# Patient Record
Sex: Female | Born: 1955
Health system: Southern US, Community
[De-identification: ages and names within clinical notes are randomized; demographics above are authoritative.]

## PROBLEM LIST (undated history)

## (undated) DIAGNOSIS — R112 Nausea with vomiting, unspecified: Secondary | ICD-10-CM

## (undated) DIAGNOSIS — C50412 Malignant neoplasm of upper-outer quadrant of left female breast: Secondary | ICD-10-CM

## (undated) DIAGNOSIS — J40 Bronchitis, not specified as acute or chronic: Secondary | ICD-10-CM

## (undated) DIAGNOSIS — J189 Pneumonia, unspecified organism: Secondary | ICD-10-CM

## (undated) DIAGNOSIS — I10 Essential (primary) hypertension: Secondary | ICD-10-CM

## (undated) DIAGNOSIS — R200 Anesthesia of skin: Secondary | ICD-10-CM

## (undated) DIAGNOSIS — F419 Anxiety disorder, unspecified: Secondary | ICD-10-CM

## (undated) DIAGNOSIS — M1990 Unspecified osteoarthritis, unspecified site: Secondary | ICD-10-CM

## (undated) DIAGNOSIS — Z9889 Other specified postprocedural states: Secondary | ICD-10-CM

## (undated) DIAGNOSIS — F329 Major depressive disorder, single episode, unspecified: Secondary | ICD-10-CM

## (undated) DIAGNOSIS — R202 Paresthesia of skin: Secondary | ICD-10-CM

## (undated) DIAGNOSIS — F32A Depression, unspecified: Secondary | ICD-10-CM

## (undated) DIAGNOSIS — J449 Chronic obstructive pulmonary disease, unspecified: Secondary | ICD-10-CM

## (undated) DIAGNOSIS — R351 Nocturia: Secondary | ICD-10-CM

## (undated) HISTORY — PX: BREAST LUMPECTOMY: SHX2

## (undated) HISTORY — DX: Malignant neoplasm of upper-outer quadrant of left female breast: C50.412

## (undated) HISTORY — PX: MULTIPLE TOOTH EXTRACTIONS: SHX2053

---

## 1998-11-12 ENCOUNTER — Encounter: Admission: RE | Admit: 1998-11-12 | Discharge: 1998-11-12 | Payer: Self-pay | Admitting: General Practice

## 1998-11-12 ENCOUNTER — Encounter: Payer: Self-pay | Admitting: General Practice

## 1999-01-09 ENCOUNTER — Encounter: Payer: Self-pay | Admitting: General Practice

## 1999-01-09 ENCOUNTER — Encounter: Admission: RE | Admit: 1999-01-09 | Discharge: 1999-01-09 | Payer: Self-pay | Admitting: General Practice

## 1999-01-15 ENCOUNTER — Encounter: Payer: Self-pay | Admitting: General Practice

## 1999-01-15 ENCOUNTER — Ambulatory Visit (HOSPITAL_COMMUNITY): Admission: RE | Admit: 1999-01-15 | Discharge: 1999-01-15 | Payer: Self-pay | Admitting: General Practice

## 1999-01-15 ENCOUNTER — Encounter (INDEPENDENT_AMBULATORY_CARE_PROVIDER_SITE_OTHER): Payer: Self-pay

## 1999-04-10 ENCOUNTER — Encounter (HOSPITAL_BASED_OUTPATIENT_CLINIC_OR_DEPARTMENT_OTHER): Payer: Self-pay | Admitting: General Surgery

## 1999-04-14 ENCOUNTER — Encounter (INDEPENDENT_AMBULATORY_CARE_PROVIDER_SITE_OTHER): Payer: Self-pay | Admitting: *Deleted

## 1999-04-14 ENCOUNTER — Ambulatory Visit (HOSPITAL_COMMUNITY): Admission: RE | Admit: 1999-04-14 | Discharge: 1999-04-14 | Payer: Self-pay | Admitting: General Surgery

## 2000-08-24 ENCOUNTER — Other Ambulatory Visit: Admission: RE | Admit: 2000-08-24 | Discharge: 2000-08-24 | Payer: Self-pay | Admitting: Obstetrics and Gynecology

## 2000-11-09 ENCOUNTER — Encounter: Payer: Self-pay | Admitting: General Practice

## 2000-11-09 ENCOUNTER — Encounter: Admission: RE | Admit: 2000-11-09 | Discharge: 2000-11-09 | Payer: Self-pay | Admitting: General Practice

## 2000-12-14 ENCOUNTER — Encounter: Payer: Self-pay | Admitting: General Practice

## 2000-12-14 ENCOUNTER — Encounter: Admission: RE | Admit: 2000-12-14 | Discharge: 2000-12-14 | Payer: Self-pay | Admitting: General Practice

## 2002-06-30 ENCOUNTER — Other Ambulatory Visit: Admission: RE | Admit: 2002-06-30 | Discharge: 2002-06-30 | Payer: Self-pay | Admitting: Obstetrics and Gynecology

## 2003-06-03 ENCOUNTER — Emergency Department (HOSPITAL_COMMUNITY): Admission: EM | Admit: 2003-06-03 | Discharge: 2003-06-03 | Payer: Self-pay | Admitting: Emergency Medicine

## 2003-06-09 ENCOUNTER — Emergency Department (HOSPITAL_COMMUNITY): Admission: EM | Admit: 2003-06-09 | Discharge: 2003-06-09 | Payer: Self-pay

## 2003-07-05 ENCOUNTER — Emergency Department (HOSPITAL_COMMUNITY): Admission: EM | Admit: 2003-07-05 | Discharge: 2003-07-05 | Payer: Self-pay | Admitting: Family Medicine

## 2005-05-11 ENCOUNTER — Emergency Department (HOSPITAL_COMMUNITY): Admission: EM | Admit: 2005-05-11 | Discharge: 2005-05-11 | Payer: Self-pay | Admitting: Family Medicine

## 2005-05-14 ENCOUNTER — Emergency Department (HOSPITAL_COMMUNITY): Admission: EM | Admit: 2005-05-14 | Discharge: 2005-05-14 | Payer: Self-pay | Admitting: Family Medicine

## 2006-09-09 ENCOUNTER — Emergency Department (HOSPITAL_COMMUNITY): Admission: EM | Admit: 2006-09-09 | Discharge: 2006-09-09 | Payer: Self-pay | Admitting: Emergency Medicine

## 2006-09-29 ENCOUNTER — Emergency Department (HOSPITAL_COMMUNITY): Admission: EM | Admit: 2006-09-29 | Discharge: 2006-09-29 | Payer: Self-pay | Admitting: Emergency Medicine

## 2007-09-16 ENCOUNTER — Emergency Department (HOSPITAL_COMMUNITY): Admission: EM | Admit: 2007-09-16 | Discharge: 2007-09-16 | Payer: Self-pay | Admitting: Family Medicine

## 2007-09-17 ENCOUNTER — Emergency Department (HOSPITAL_COMMUNITY): Admission: EM | Admit: 2007-09-17 | Discharge: 2007-09-18 | Payer: Self-pay | Admitting: Emergency Medicine

## 2007-10-04 ENCOUNTER — Encounter: Admission: RE | Admit: 2007-10-04 | Discharge: 2007-10-04 | Payer: Self-pay | Admitting: Cardiovascular Disease

## 2007-10-28 ENCOUNTER — Emergency Department (HOSPITAL_COMMUNITY): Admission: EM | Admit: 2007-10-28 | Discharge: 2007-10-28 | Payer: Self-pay | Admitting: Emergency Medicine

## 2007-10-29 ENCOUNTER — Emergency Department (HOSPITAL_COMMUNITY): Admission: EM | Admit: 2007-10-29 | Discharge: 2007-10-29 | Payer: Self-pay | Admitting: Family Medicine

## 2007-11-01 ENCOUNTER — Encounter: Admission: RE | Admit: 2007-11-01 | Discharge: 2007-11-01 | Payer: Self-pay | Admitting: Cardiovascular Disease

## 2008-09-16 ENCOUNTER — Emergency Department (HOSPITAL_COMMUNITY): Admission: EM | Admit: 2008-09-16 | Discharge: 2008-09-16 | Payer: Self-pay | Admitting: Family Medicine

## 2009-03-24 ENCOUNTER — Emergency Department (HOSPITAL_COMMUNITY): Admission: EM | Admit: 2009-03-24 | Discharge: 2009-03-24 | Payer: Self-pay | Admitting: Emergency Medicine

## 2009-03-25 ENCOUNTER — Ambulatory Visit (HOSPITAL_COMMUNITY): Admission: RE | Admit: 2009-03-25 | Discharge: 2009-03-25 | Payer: Self-pay | Admitting: Emergency Medicine

## 2009-03-25 ENCOUNTER — Ambulatory Visit: Payer: Self-pay | Admitting: Vascular Surgery

## 2009-03-25 ENCOUNTER — Encounter (INDEPENDENT_AMBULATORY_CARE_PROVIDER_SITE_OTHER): Payer: Self-pay | Admitting: Emergency Medicine

## 2010-01-26 ENCOUNTER — Encounter: Payer: Self-pay | Admitting: Orthopedic Surgery

## 2010-03-31 LAB — CBC
HCT: 37.1 % (ref 36.0–46.0)
Platelets: 265 10*3/uL (ref 150–400)
RDW: 13.8 % (ref 11.5–15.5)

## 2010-03-31 LAB — D-DIMER, QUANTITATIVE: D-Dimer, Quant: 0.56 ug/mL-FEU — ABNORMAL HIGH (ref 0.00–0.48)

## 2010-04-11 LAB — POCT URINALYSIS DIP (DEVICE)
Glucose, UA: NEGATIVE mg/dL
Ketones, ur: NEGATIVE mg/dL
Nitrite: NEGATIVE
Urobilinogen, UA: 0.2 mg/dL (ref 0.0–1.0)

## 2010-04-15 ENCOUNTER — Inpatient Hospital Stay (INDEPENDENT_AMBULATORY_CARE_PROVIDER_SITE_OTHER)
Admission: RE | Admit: 2010-04-15 | Discharge: 2010-04-15 | Disposition: A | Discharge status: Home / Self Care | Payer: Self-pay | Source: Ambulatory Visit | Attending: Emergency Medicine | Admitting: Emergency Medicine

## 2010-04-15 DIAGNOSIS — IMO0002 Reserved for concepts with insufficient information to code with codable children: Secondary | ICD-10-CM

## 2010-05-22 ENCOUNTER — Inpatient Hospital Stay (INDEPENDENT_AMBULATORY_CARE_PROVIDER_SITE_OTHER)
Admission: RE | Admit: 2010-05-22 | Discharge: 2010-05-22 | Disposition: A | Discharge status: Home / Self Care | Payer: Self-pay | Source: Ambulatory Visit | Attending: Family Medicine | Admitting: Family Medicine

## 2010-05-22 DIAGNOSIS — J40 Bronchitis, not specified as acute or chronic: Secondary | ICD-10-CM

## 2010-05-23 NOTE — Op Note (Signed)
[place]  Patient:    Deborah Mcintyre, Deborah Mcintyre                    MRN: [id number] Proc. Date: 04/14/99 Adm. Date:  [id number] Attending:  Sonda Primes CC:         Mardene Celeste. Lurene Shadow, M.D. (2)                           Operative Report  PREOPERATIVE DIAGNOSIS: 1. Fibroadenoma of right breast. 2. Sebaceous cyst of left breast.  POSTOPERATIVE DIAGNOSIS: 1. Fibroadenoma of right breast. 2. Sebaceous cyst of left breast.  OPERATION PERFORMED:  Removal of bilateral breast masses, fibroadenoma and sebaceous cyst.  SURGEON:   Luisa Hart L. Lurene Shadow, M.D.  ASSISTANT:  Nurse.  ANESTHESIA:  General.  INDICATIONS FOR PROCEDURE:  The patient is a 55 year old woman presenting with bilateral breast masses, one of which is clinically a fibroadenoma on the right side and the left sided lesion appears to be a sebaceous cyst within the inframammary fold.  She is brought to the operating room now for excision.  DESCRIPTION OF PROCEDURE:  Following induction of anesthesia with the patient positioned supinely, the breasts were prepped and draped to be included in a sterile operative field.  The right breast mass was first approached and its location identified as right at the axillary tail of the axillary hairline. This was approached through a transverse incision and deepened through the skin and subcutaneous tissues carrying the dissection down to the mass.  The mass was sharply excised and removed and forwarded for pathologic evaluation. Hemostasis was ensured with electrocautery.  Sponge, instrument and sharp counts were verified.  Wound closed with interrupted 3-0 Vicryl sutures in the deep tissues and a 5-0 Monocryl in the skin, reinforced with Steri-Strips.  Attention was then turned to the left breast where the inframammary lesion was located.  An elliptical incision was carried down around the lesion, deepened through the skin and subcutaneous  tissues and taking a margin of normal tissue around the sebaceous cyst.  This was removed and forwarded for pathologic evaluation.  Hemostasis was obtained with electrocautery.  Subcutaneous tissues closed with interrupted 3-0 Vicryl sutures.  Skin closed with 5-0 Monocryl suture.  Both wounds were steri-stripped and sterile dressings were applied.  Anesthetic reversed.  Patient removed from the operating room to the recovery room in stable condition having tolerated the procedure well. DD:  04/14/99 TD:  04/14/99 Job: [id number]

## 2010-08-27 ENCOUNTER — Inpatient Hospital Stay (INDEPENDENT_AMBULATORY_CARE_PROVIDER_SITE_OTHER)
Admission: RE | Admit: 2010-08-27 | Discharge: 2010-08-27 | Disposition: A | Discharge status: Home / Self Care | Payer: Self-pay | Source: Ambulatory Visit | Attending: Family Medicine | Admitting: Family Medicine

## 2010-08-27 ENCOUNTER — Emergency Department (HOSPITAL_COMMUNITY)
Admission: EM | Admit: 2010-08-27 | Discharge: 2010-08-27 | Disposition: A | Discharge status: Home / Self Care | Payer: Self-pay | Attending: Emergency Medicine | Admitting: Emergency Medicine

## 2010-08-27 DIAGNOSIS — H11419 Vascular abnormalities of conjunctiva, unspecified eye: Secondary | ICD-10-CM | POA: Insufficient documentation

## 2010-08-27 DIAGNOSIS — H5789 Other specified disorders of eye and adnexa: Secondary | ICD-10-CM | POA: Insufficient documentation

## 2010-08-27 DIAGNOSIS — F329 Major depressive disorder, single episode, unspecified: Secondary | ICD-10-CM | POA: Insufficient documentation

## 2010-08-27 DIAGNOSIS — Z79899 Other long term (current) drug therapy: Secondary | ICD-10-CM | POA: Insufficient documentation

## 2010-08-27 DIAGNOSIS — F3289 Other specified depressive episodes: Secondary | ICD-10-CM | POA: Insufficient documentation

## 2010-08-27 DIAGNOSIS — H05019 Cellulitis of unspecified orbit: Secondary | ICD-10-CM | POA: Insufficient documentation

## 2010-08-27 DIAGNOSIS — M549 Dorsalgia, unspecified: Secondary | ICD-10-CM | POA: Insufficient documentation

## 2010-08-27 DIAGNOSIS — G8929 Other chronic pain: Secondary | ICD-10-CM | POA: Insufficient documentation

## 2010-08-27 DIAGNOSIS — M129 Arthropathy, unspecified: Secondary | ICD-10-CM | POA: Insufficient documentation

## 2010-08-27 DIAGNOSIS — H109 Unspecified conjunctivitis: Secondary | ICD-10-CM | POA: Insufficient documentation

## 2010-08-27 DIAGNOSIS — I10 Essential (primary) hypertension: Secondary | ICD-10-CM | POA: Insufficient documentation

## 2010-10-07 LAB — CBC
Hemoglobin: 10.4 — ABNORMAL LOW
Platelets: 226
RBC: 3.23 — ABNORMAL LOW
RDW: 14.2

## 2010-10-07 LAB — URINALYSIS, ROUTINE W REFLEX MICROSCOPIC
Leukocytes, UA: NEGATIVE
Nitrite: NEGATIVE
Protein, ur: NEGATIVE
Urobilinogen, UA: 0.2
pH: 6.5

## 2010-10-07 LAB — COMPREHENSIVE METABOLIC PANEL
ALT: 12
AST: 18
Albumin: 3.6
Calcium: 8.4
Chloride: 101
Creatinine, Ser: 0.66
GFR calc Af Amer: >60
GFR calc non Af Amer: >60
Glucose, Bld: 85
Potassium: 3.7
Sodium: 133 — ABNORMAL LOW
Total Protein: 5.5 — ABNORMAL LOW

## 2010-10-07 LAB — URINE MICROSCOPIC-ADD ON

## 2010-10-07 LAB — POCT PREGNANCY, URINE: Preg Test, Ur: NEGATIVE

## 2010-10-08 LAB — COMPREHENSIVE METABOLIC PANEL
ALT: 8
CO2: 20
Calcium: 8.2 — ABNORMAL LOW
Chloride: 109
Creatinine, Ser: 0.61
GFR calc Af Amer: >60
Potassium: 3.6
Sodium: 137
Total Bilirubin: 0.7
Total Protein: 5.8 — ABNORMAL LOW

## 2010-10-08 LAB — POCT URINALYSIS DIP (DEVICE)
Nitrite: NEGATIVE
Operator id: 239701
Protein, ur: NEGATIVE

## 2010-10-08 LAB — CBC
Platelets: 172
RBC: 3.21 — ABNORMAL LOW
RDW: 15.1
WBC: 8.6

## 2010-10-08 LAB — DIFFERENTIAL
Basophils Absolute: 0
Basophils Relative: 0
Lymphocytes Relative: 18
Monocytes Absolute: 0.5
Neutro Abs: 6.3
Neutrophils Relative %: 74

## 2010-10-08 LAB — LIPASE, BLOOD: Lipase: 18

## 2010-10-16 LAB — URINE CULTURE

## 2010-10-16 LAB — POCT URINALYSIS DIP (DEVICE)
Ketones, ur: NEGATIVE
Nitrite: NEGATIVE
Operator id: 247071
Protein, ur: 100 — AB

## 2010-10-17 LAB — POCT PREGNANCY, URINE
Operator id: 239701
Preg Test, Ur: NEGATIVE

## 2010-10-17 LAB — POCT URINALYSIS DIP (DEVICE)
Bilirubin Urine: NEGATIVE
Glucose, UA: NEGATIVE
Nitrite: NEGATIVE
Operator id: 239701
Urobilinogen, UA: 0.2

## 2010-10-17 LAB — POCT H PYLORI SCREEN: H. PYLORI SCREEN, POC: POSITIVE — AB

## 2010-10-17 LAB — URINE CULTURE: Colony Count: NO GROWTH

## 2011-01-08 ENCOUNTER — Encounter: Payer: Self-pay | Admitting: *Deleted

## 2011-01-08 ENCOUNTER — Emergency Department (HOSPITAL_COMMUNITY)
Admission: EM | Admit: 2011-01-08 | Discharge: 2011-01-08 | Disposition: A | Discharge status: Home / Self Care | Payer: Self-pay | Attending: Emergency Medicine | Admitting: Emergency Medicine

## 2011-01-08 ENCOUNTER — Emergency Department (HOSPITAL_COMMUNITY): Payer: Self-pay

## 2011-01-08 DIAGNOSIS — R509 Fever, unspecified: Secondary | ICD-10-CM | POA: Insufficient documentation

## 2011-01-08 DIAGNOSIS — R059 Cough, unspecified: Secondary | ICD-10-CM | POA: Insufficient documentation

## 2011-01-08 DIAGNOSIS — R062 Wheezing: Secondary | ICD-10-CM | POA: Insufficient documentation

## 2011-01-08 DIAGNOSIS — J3489 Other specified disorders of nose and nasal sinuses: Secondary | ICD-10-CM | POA: Insufficient documentation

## 2011-01-08 DIAGNOSIS — R110 Nausea: Secondary | ICD-10-CM | POA: Insufficient documentation

## 2011-01-08 DIAGNOSIS — I10 Essential (primary) hypertension: Secondary | ICD-10-CM | POA: Insufficient documentation

## 2011-01-08 DIAGNOSIS — IMO0001 Reserved for inherently not codable concepts without codable children: Secondary | ICD-10-CM | POA: Insufficient documentation

## 2011-01-08 DIAGNOSIS — J441 Chronic obstructive pulmonary disease with (acute) exacerbation: Secondary | ICD-10-CM | POA: Insufficient documentation

## 2011-01-08 DIAGNOSIS — J111 Influenza due to unidentified influenza virus with other respiratory manifestations: Secondary | ICD-10-CM | POA: Insufficient documentation

## 2011-01-08 DIAGNOSIS — R51 Headache: Secondary | ICD-10-CM | POA: Insufficient documentation

## 2011-01-08 DIAGNOSIS — R0602 Shortness of breath: Secondary | ICD-10-CM | POA: Insufficient documentation

## 2011-01-08 DIAGNOSIS — R05 Cough: Secondary | ICD-10-CM | POA: Insufficient documentation

## 2011-01-08 HISTORY — DX: Bronchitis, not specified as acute or chronic: J40

## 2011-01-08 HISTORY — DX: Essential (primary) hypertension: I10

## 2011-01-08 HISTORY — DX: Pneumonia, unspecified organism: J18.9

## 2011-01-08 HISTORY — DX: Unspecified osteoarthritis, unspecified site: M19.90

## 2011-01-08 MED ORDER — DOXYCYCLINE HYCLATE 100 MG PO CAPS: 100.0000 mg | ORAL_CAPSULE | Freq: Two times a day (BID) | ORAL | Status: AC

## 2011-01-08 MED ORDER — PREDNISONE 20 MG PO TABS: 60.0000 mg | ORAL_TABLET | Freq: Every day | ORAL | Status: AC

## 2011-01-08 MED ORDER — ALBUTEROL SULFATE HFA 108 (90 BASE) MCG/ACT IN AERS
2.0000 | INHALATION_SPRAY | RESPIRATORY_TRACT | Status: DC | PRN
  Filled 2011-01-08: qty 6.7

## 2011-01-08 MED ORDER — ALBUTEROL SULFATE (2.5 MG/3ML) 0.083% IN NEBU: 2.5000 mg | INHALATION_SOLUTION | Freq: Four times a day (QID) | RESPIRATORY_TRACT | Status: DC | PRN

## 2011-01-08 MED ORDER — IPRATROPIUM BROMIDE 0.02 % IN SOLN
0.5000 mg | Freq: Once | RESPIRATORY_TRACT | Status: AC
  Administered 2011-01-08: 0.5 mg via RESPIRATORY_TRACT
  Filled 2011-01-08: qty 2.5

## 2011-01-08 MED ORDER — ACETAMINOPHEN 325 MG PO TABS
975.0000 mg | ORAL_TABLET | Freq: Once | ORAL | Status: AC
  Administered 2011-01-08: 975 mg via ORAL
  Filled 2011-01-08: qty 1

## 2011-01-08 MED ORDER — ALBUTEROL SULFATE (5 MG/ML) 0.5% IN NEBU
2.5000 mg | INHALATION_SOLUTION | Freq: Once | RESPIRATORY_TRACT | Status: AC
  Administered 2011-01-08: 2.5 mg via RESPIRATORY_TRACT
  Filled 2011-01-08: qty 1

## 2011-01-08 MED ORDER — ALBUTEROL SULFATE (5 MG/ML) 0.5% IN NEBU
5.0000 mg | INHALATION_SOLUTION | RESPIRATORY_TRACT | Status: DC
  Administered 2011-01-08 (×2): 5 mg via RESPIRATORY_TRACT
  Filled 2011-01-08 (×2): qty 1

## 2011-01-08 MED ORDER — ACETAMINOPHEN 325 MG PO TABS
ORAL_TABLET | ORAL | Status: AC
  Administered 2011-01-08: 975 mg via ORAL
  Filled 2011-01-08: qty 3

## 2011-01-08 MED ORDER — PREDNISONE 20 MG PO TABS
60.0000 mg | ORAL_TABLET | Freq: Once | ORAL | Status: AC
  Administered 2011-01-08: 60 mg via ORAL
  Filled 2011-01-08: qty 3

## 2011-01-08 MED ORDER — AEROCHAMBER MAX W/MASK MEDIUM MISC: Status: AC

## 2011-01-08 MED ORDER — ALBUTEROL SULFATE (5 MG/ML) 0.5% IN NEBU
2.5000 mg | INHALATION_SOLUTION | Freq: Once | RESPIRATORY_TRACT | Status: AC
Start: 2011-01-08 — End: 2011-01-08
  Administered 2011-01-08: 2.5 mg via RESPIRATORY_TRACT

## 2011-01-08 NOTE — ED Provider Notes (Signed)
History     CSN: [id number]  Arrival date & time 01/08/11  1153   First MD Initiated Contact with Patient 01/08/11 1205      Chief Complaint  Patient presents with  . Fever    (Consider location/radiation/quality/duration/timing/severity/associated sxs/prior treatment) HPI Comments: Patient reports that for the past 3 days she has had headache, sore throat, body aches, cough, fever, and nasal congestion.  She reports that she has been taking care of her mother who has influenza.  She did not get a flu shot this year.  PMH significant for COPD.  She reports that over the last couple of days she has been more short of breath.  She currently smokes 1 ppd and has for the past 37 years.  She denies any chest pain at this time.    Patient has not had any surgeries in the past 4 weeks, no prolonged travel in the past 4 weeks, no prior history of DVT/PE, no estrogen therapy.  Patient is a 56 y.o. female presenting with fever. The history is provided by the patient.  Fever Primary symptoms of the febrile illness include fever, headaches, wheezing, shortness of breath and nausea. Primary symptoms do not include abdominal pain, vomiting, diarrhea or rash. Episode onset: Three days ago. The problem has been gradually worsening.  The headache is not associated with neck stiffness.     Past Medical History  Diagnosis Date  . Hypertension   . Pneumonia   . Bronchitis   . Arthritis     Past Surgical History  Procedure Date  . Cesarean section     History reviewed. No pertinent family history.  History  Substance Use Topics  . Smoking status: Current Everyday Smoker -- 1.0 packs/day  . Smokeless tobacco: Not on file  . Alcohol Use: 2.4 oz/week    4 Cans of beer per week    OB History    Grav Para Term Preterm Abortions TAB SAB Ect Mult Living                  Review of Systems  Constitutional: Positive for fever. Negative for chills and diaphoresis.  HENT: Positive for sore  throat and rhinorrhea. Negative for neck pain and neck stiffness.   Eyes: Negative for visual disturbance.  Respiratory: Positive for shortness of breath and wheezing.   Cardiovascular: Negative for chest pain.  Gastrointestinal: Positive for nausea. Negative for vomiting, abdominal pain and diarrhea.  Skin: Negative for rash.  Neurological: Positive for dizziness and headaches. Negative for syncope.  Psychiatric/Behavioral: Negative for confusion.    Allergies  Review of patient's allergies indicates no known allergies.  Home Medications  No current outpatient prescriptions on file.  BP 144/83  Pulse 127  Temp(Src) 103 F (39.4 C) (Oral)  Resp 38  SpO2 98%  Physical Exam  Nursing note and vitals reviewed. Constitutional: She is oriented to person, place, and time. She appears well-developed and well-nourished. No distress.  HENT:  Head: Normocephalic and atraumatic.  Neck: Normal range of motion. Neck supple.  Cardiovascular: Regular rhythm and normal heart sounds.  Tachycardia present.   Pulmonary/Chest: No accessory muscle usage. Tachypnea noted. She has wheezes. She has no rhonchi. She has no rales.       Diffuse expiratory wheezing.    Abdominal: Soft. Bowel sounds are normal. She exhibits no distension and no mass. There is no tenderness. There is no rebound and no guarding.  Neurological: She is alert and oriented to person, place, and time.  Skin: Skin is warm and dry. She is not diaphoretic.  Psychiatric: She has a normal mood and affect.    ED Course  Procedures (including critical care time)  Labs Reviewed - No data to display Dg Chest 2 View  01/08/2011  *RADIOLOGY REPORT*  Clinical Data: Fever and cough.  CHEST - 2 VIEW  Comparison: Chest x-ray 09/17/2007.  Findings: The cardiac silhouette, mediastinal and hilar contours are within normal limits and stable.  There are bronchitic type lung changes but no infiltrates or effusions.  The bony thorax is intact.   IMPRESSION: Bronchitic type lung changes but no focal infiltrates.  Original Report Authenticated By: P. Loralie Champagne, M.D.     No diagnosis found.  Patient given Albuterol/Atrovent neb treatment.  She reports that she is feeling somewhat better after the treatment.  Patient remains tachypneic.  Lungs with diffuse expiratory wheezing.  Will order another breathing treatment and 60mg  Prednisone.  Reassessed patient after 2nd breathing treatment.  Lungs now CTAB.  Her oxygen saturation is at 92 on RA.   She reports that she is feeling much better.  Plan is to walk patient to make sure that her oxygen level does not desat while walking.  Patient walked and her oxygen sat did not decrease while walking.  Therefore, feel that patient can be discharged at this time.  Patient in agreement with the plan.  She will be discharged with Prednisone, antibiotic, and albuterol nebs.   MDM  Patient tachycardic.  However, feel that tachycardia is most likely secondary to albuterol treatment and fever.  Patient has not had any surgeries in the past 4 weeks, no prolonged travel in the past 4 weeks, no prior history of DVT/PE, no estrogen therapy.  Feel that shortness of breath is most likely secondary to COPD exacerbation.        Pascal Lux [facility] 01/09/11 1125

## 2011-01-08 NOTE — ED Notes (Signed)
Pt ambulated in the hallway with sats 89-92% on RA which is on trend with her sats while resting in bed. Discussed with [facility] who will D/c patient with home albuterol inhaler.

## 2011-01-08 NOTE — ED Notes (Signed)
Pt in per [facility] pt from home c/o fever x 2 days with cough, pt A&O x4, follows commands, speaks in complete, NAD

## 2011-01-08 NOTE — ED Notes (Signed)
Pt up to the bathroom without difficulty. Denies pain at the time. Provider at the bedside. No signs of distress noted.

## 2011-01-09 NOTE — ED Provider Notes (Signed)
Medical screening examination/treatment/procedure(s) were performed by non-physician practitioner and as supervising physician I was immediately available for consultation/collaboration.   Glynn Octave, MD 01/09/11 [phone]

## 2011-05-26 ENCOUNTER — Encounter (HOSPITAL_COMMUNITY): Payer: Self-pay | Admitting: *Deleted

## 2011-05-26 ENCOUNTER — Emergency Department (HOSPITAL_COMMUNITY)
Admission: EM | Admit: 2011-05-26 | Discharge: 2011-05-26 | Disposition: A | Discharge status: Home / Self Care | Payer: Self-pay | Source: Home / Self Care | Attending: Emergency Medicine | Admitting: Emergency Medicine

## 2011-05-26 DIAGNOSIS — J441 Chronic obstructive pulmonary disease with (acute) exacerbation: Secondary | ICD-10-CM

## 2011-05-26 MED ORDER — PREDNISONE 10 MG PO TABS: ORAL_TABLET | ORAL | Status: DC

## 2011-05-26 MED ORDER — AMOXICILLIN 500 MG PO CAPS: 1000.0000 mg | ORAL_CAPSULE | Freq: Three times a day (TID) | ORAL | Status: DC

## 2011-05-26 MED ORDER — METHYLPREDNISOLONE ACETATE 80 MG/ML IJ SUSP
INTRAMUSCULAR | Status: AC
  Filled 2011-05-26: qty 1

## 2011-05-26 MED ORDER — AMOXICILLIN 500 MG PO CAPS: 1000.0000 mg | ORAL_CAPSULE | Freq: Three times a day (TID) | ORAL | Status: AC

## 2011-05-26 MED ORDER — IPRATROPIUM-ALBUTEROL 0.5-2.5 (3) MG/3ML IN SOLN: 3.0000 mL | RESPIRATORY_TRACT | Status: DC | PRN

## 2011-05-26 MED ORDER — METHYLPREDNISOLONE ACETATE 80 MG/ML IJ SUSP
80.0000 mg | Freq: Once | INTRAMUSCULAR | Status: AC
  Administered 2011-05-26: 80 mg via INTRAMUSCULAR

## 2011-05-26 MED ORDER — GUAIFENESIN-CODEINE 100-10 MG/5ML PO SYRP: 10.0000 mL | ORAL_SOLUTION | Freq: Four times a day (QID) | ORAL | Status: AC | PRN

## 2011-05-26 NOTE — Discharge Instructions (Signed)
Chronic Obstructive Pulmonary Disease Chronic obstructive pulmonary disease (COPD) is a condition in which airflow from the lungs is restricted. The lungs can never return to normal, but there are measures you can take which will improve them and make you feel better. CAUSES   Smoking.   Exposure to secondhand smoke.   Breathing in irritants (pollution, cigarette smoke, strong smells, aerosol sprays, paint fumes).   History of lung infections.  TREATMENT  Treatment focuses on making you comfortable (supportive care). Your caregiver may prescribe medications (inhaled or pills) to help improve your breathing. HOME CARE INSTRUCTIONS   If you smoke, stop smoking.   Avoid exposure to smoke, chemicals, and fumes that aggravate your breathing.   Take antibiotic medicines as directed by your caregiver.   Avoid medicines that dry up your system and slow down the elimination of secretions (antihistamines and cough syrups). This decreases respiratory capacity and may lead to infections.   Drink enough water and fluids to keep your urine clear or pale yellow. This loosens secretions.   Use humidifiers at home and at your bedside if they do not make breathing difficult.   Receive all protective vaccines your caregiver suggests, especially pneumococcal and influenza.   Use home oxygen as suggested.   Stay active. Exercise and physical activity will help maintain your ability to do things you want to do.   Eat a healthy diet.  SEEK MEDICAL CARE IF:   You develop pus-like mucus (sputum).   Breathing is more labored or exercise becomes difficult to do.   You are running out of the medicine you take for your breathing.  SEEK IMMEDIATE MEDICAL CARE IF:   You have a rapid heart rate.   You have agitation, confusion, tremors, or are in a stupor (family members may need to observe this).   It becomes difficult to breathe.   You develop chest pain.   You have a fever.  MAKE SURE YOU:    Understand these instructions.   Will watch your condition.   Will get help right away if you are not doing well or get worse.  Document Released: 10/01/2004 Document Revised: 12/11/2010 Document Reviewed: 02/21/2010 [facility] Patient Information 2012 [place], [place].   How to Quit Smoking  According to the U.S. Surgeon General, about 440,000 people in the [place] alone die from complications related to tobacco use.  More deaths occur due to cigarette smoking than illegal drug use, AIDS, car accidents, alcohol-related deaths, suicide and homicide combined.  Smoking accounts for about 30% of all cancer related deaths, including more than 80% of lung cancer deaths. Smoking has also been linked as the cause of many other diseases like heart disease, bronchitis, emphysema, stroke, and complications of pneumonia as well as causing an increased risk of miscarriage, premature births, stillbirth, infant death, and low birth weight in infants.  For this reason, the U.S. Surgeon General recommends:  "Smoking cessation (stopping smoking) represents the single most important step that smokers can take to enhance the length and quality of their lives."  Why is it so hard to Quit?  Tobacco products contain Nicotine which is highly addictive - probably as addictive as heroin or cocaine.  Over time, your body becomes both physically and psychologically dependent on it. Finally, attempts to quit smoking are complicated by withdrawal reactions like depression, irritability, trouble sleeping, trouble concentrating, restlessness, headaches, weight gain, and excessive fatigue as well as a lack of support.  These symptoms can last from a few days to  several weeks.  Why should you Quit?  Live longer and healthier  Can improves the health of your housemates (children, spouse)  Increases your energy and breathing ability  Lowers risk of heart attack, stroke and cancer  Saves money - For example, if  you smoke a pack of cigarettes a day and each pack costs about $3.00, then you will save about $1,100.00 per year, about $5,500. in 5 years and $11,000.00 in 10 years.  What you can do: 1. Talk to your health care provider - There are many smoking cessations aids available, both prescription or over-the-counter.  Check with your doctor and pharmacist before taking any of these products to see which one is best for you.  Develop a plan with your healthcare provider which may include nicotine replacement, prescription medication and/or counseling.  2. Get Started - Loraine Leriche a start date on your calendar.  Remove cigarettes and ashtrays from your home, car, and office.  Don't be around other smokers.  Stop smoking.not even a puff!  3. Support - Talk to family, friends, and co-workers about your plan to stop smoking.  Ask them not to smoke around you.  4. Coping Strategies  The four "A"s to help during tough times:  a. Avoid - Avoid other smokers or places where smoking is commonplace.  Avoid alcoholic beverages as these may increase your desire to smoke b. Alter - Change your routine.  Drink water/juices instead of alcohol or coffee.  Take a walk or visit with someone during your coffee break.  Change your route to work. c. Alternatives - Substitute raw vegetables like carrot sticks or celery, sugarless candy or gum for the habit of having a cigarette. d. Activities - Start an exercise program (talk to your doctor prior to beginning any exercise program). Try out a new "hands on" hobby to distract you from smoking and to keep your hands busy like woodworking or needlepoint.   Additional tips for specific withdrawal symptoms:   Cravings for tobacco:  - Distract yourself       - Deep-breathing exercises       - Remember that cravings are brief    Irritability:   - Take a few slow, deep breaths      - Soak in a hot bath    Insomnia:   - Take a walk several hours before bedtime      - Avoid  caffeinated beverages after noon      - Read a book      - Take a warm bath      - Banana or warm milk    Increased appetite:  - Drink water or low-calorie drinks      - Make a survival Kit: Include straws, cinnamon        sticks,       - coffee stirrers, licorice, toothpicks, gum, or fresh         vegetables    Inability to concentrate: - Take a brisk walk      - Lighten your schedule for a couple of days      - Take more breaks   Fatigue:   - Get a good night's sleep      - Take a nap      - Don't overdo it for 2-4 weeks    Constipation, gas,  stomach pain:   - Drink plenty of fluids      - Increase fiber: fruit, raw vegetables, whole grain  cereals      - Talk to your doctor about diet changes    5. Dealing with Relapses - Most relapses occur within the first 2-3 months.  This is common so don't be discouraged.  Some people may take several attempts before they can quit smoking completely.  6. Reward yourself - Set-up a rewards program for every milestone, like 1st month after quitting, 3rd month after quitting, and 6th month after quitting to keep you motivated.  What your doctor can do: Perform a physical exam and order diagnostic tests like laboratory blood work and a chest X-ray.  This will help to identify health related conditions that might benefit from smoking cessation. Review your health history to make sure there are no contraindications with specific smoking cessation aids like allergies to medications or ingredients in these medications or conflicts with your current medications. Prescribe smoking cessation aids such as: Over-the-counter aid:  Nicotine gum, Nicotine patch Prescription aids:  Nicotine spray, Nicotine inhaler, or Bupropion SR (non-nicotine). Offer or recommend individual or group counseling to support you during the initial quitting and maintenance phase of your smoking cessation program. Offer or recommend other alternative treatments like  hypnosis or acupuncture.  What you can expect: Benefits from quitting smoking:  Improved Physical Appearance - Minimizes or stops premature wrinkling of skin, bad breath, stained teeth, gum disease, clothes/hair "smoke" odors, and yellow fingernails  Improved Daily Activities - Food tastes better, sense of smell improves, and decreases shortness of breath during ordinary activities like climbing stairs, walking, and performing light housework  Decreased Financial Cost - From both no longer purchasing cigarettes and the health care cost for medical treatment of conditions caused by smoking.  Health of Others - Decreases risk of exposing others to the effects of second hand smoke.  Sets an example for youth. Benefits of Quitting according to research from the U.S. Surgeon General:  20 minutes after:  Blood pressure lowers & Body temperature normalizes  8 hours after: Carbon monoxide levels begins to normalize   24 hours after: Heart attack risk decreases  2 weeks to 3 months after:  Blood flow improves & lung function increases   1 to 9 months after:  Coughing, sinus congestion, fatigue, shortness of breath decrease   1 year after: Risk of developing coronary heart disease is half that of a smoker  5 years after: Risk of a stroke decreases to that of a nonsmoker  10 years after: Risk of death due to lung cancer death is halved.  Risk of oral, throat, esophagus, bladder, kidney, and pancreatic cancer decreases.  15 years after: Risk of developing coronary heart disease is half that of a nonsmoker      References:  Here are references that can provide additional information and support:  American Cancer Society     American Heart Association (800) ACS-2345       (800) F1561943 or (800) 401-567-3812 www.cancer.org       www.amhrt.Water engineer Academy of Medical Acupuncture (956)846-3412 or (617)372-0613  (800973 148 8292 or (478) 337-2709 www.lungusa.org       www.medicalacupuncture.org  National [employer] on Smoking & Health Cancer Information Service    Centers for Disease Control and Prevention (800) 4-CANCER or (800) Z5131811  (281) 762-9859 www.cancer.gov       UEarly.se  Nicotine Anonymous      Smokefree.gov (681)873-3146) TRY-NICA 715-058-4256)   479-031-8068) 44U-QUIT (226)313-8403) www.nicotine-anonymous.org  www.smokefree.gov  Contact your doctor or pharmacist if you have specific questions about starting a smoking cessation program.

## 2011-05-26 NOTE — ED Provider Notes (Signed)
Chief Complaint  Patient presents with  . Cough    History of Present Illness:   Deborah Mcintyre is a 56 year old female who is a cigarette smoker, smoking about 15 cigarettes per day and has a history of COPD. Currently she has a two-day history of cough which is sometimes dry and sometimes productive of green sputum. Her chest feels tight and she's been wheezing. She also had sore throat, hoarseness, rhinorrhea, and when she coughs she has near-syncope. She denies any fever, chills, chest pain, nausea, or vomiting.  Review of Systems:  Other than noted above, the patient denies any of the following symptoms: Systemic:  No fevers, chills, sweats, weight loss or gain, fatigue, or tiredness. ENT:  No nasal congestion, sneezing, itching, postnasal drip, sinus pressure, headache, sore throat, or hoarseness. Lungs:  No wheezing, shortness of breath, chest tightness or congestion. Heart:  No chest pain, tightness, pressure, PND, orthopnea, or ankle edema. GI:  No indigestion, heartburn, waterbrash, burping, abdominal pain, nausea, or vomiting.  PMFSH:  Past medical history, family history, social history, meds, and allergies were reviewed.  Physical Exam:   Vital signs:  BP 158/102  Pulse 89  Temp(Src) 97.4 F (36.3 C) (Oral)  Resp 22  SpO2 95% General:  Alert and oriented.  In no distress.  Skin warm and dry. ENT: TMs and ear canals normal.  Nasal mucosa normal, without drainage.  Pharynx clear without exudate or drainage.  No intraoral lesions. Neck:  No adenopathy, tenderness or mass.  No JVD. Lungs:  No respiratory distress.  Breath sounds were somewhat distant, but otherwise clear and equal bilaterally.  No wheezes, rales or rhonchi. Heart:  Regular rhythm, no gallops or murmers.  No pedal edema. Abdomon:  Soft and nontender.  No organomegaly or mass.  Course in Urgent Care Center:   She was given Depo-Medrol 80 mg IM and tolerated this well without any immediate side effects.  Assessment:   The encounter diagnosis was COPD exacerbation.  Plan:   1.  The following meds were prescribed:   New Prescriptions   AMOXICILLIN (AMOXIL) 500 MG CAPSULE    Take 2 capsules (1,000 mg total) by mouth 3 (three) times daily.   GUAIFENESIN-CODEINE (GUIATUSS AC) 100-10 MG/5ML SYRUP    Take 10 mLs by mouth 4 (four) times daily as needed for cough.   IPRATROPIUM-ALBUTEROL (DUONEB) 0.5-2.5 (3) MG/3ML SOLN    Take 3 mLs by nebulization every 4 (four) hours as needed.   PREDNISONE (DELTASONE) 10 MG TABLET    Take 4 tabs daily for 4 days, 3 tabs daily for 4 days, 2 tabs daily for 4 days, then 1 tab daily for 4 days.   2.  The patient was instructed in symptomatic care and handouts were given. 3.  The patient was told to return if becoming worse in any way, if no better in 3 or 4 days, and given some red flag symptoms that would indicate earlier return.  Follow up:  The patient was told to follow up with her primary care physician, Dr. Algie Coffer in one week.      Reuben Likes, MD 05/26/11 [phone]

## 2011-05-26 NOTE — ED Notes (Signed)
Pt  Reports  Symptoms  Of  Productive  And  Non productive  At  Intervals  Cough x  8  Days  She  Reports  A  sorethroat  And  body  Aches  As  Well    She  Reports  History of  Chronic  Bronchitis  And  Is  In fact a  Current  Smoker as  Well

## 2012-03-05 ENCOUNTER — Emergency Department (HOSPITAL_COMMUNITY): Payer: Medicaid Other

## 2012-03-05 ENCOUNTER — Encounter (HOSPITAL_COMMUNITY): Payer: Self-pay

## 2012-03-05 ENCOUNTER — Emergency Department (HOSPITAL_COMMUNITY)
Admission: EM | Admit: 2012-03-05 | Discharge: 2012-03-05 | Disposition: A | Discharge status: Home / Self Care | Payer: Medicaid Other | Attending: Emergency Medicine | Admitting: Emergency Medicine

## 2012-03-05 DIAGNOSIS — I10 Essential (primary) hypertension: Secondary | ICD-10-CM | POA: Insufficient documentation

## 2012-03-05 DIAGNOSIS — J029 Acute pharyngitis, unspecified: Secondary | ICD-10-CM | POA: Insufficient documentation

## 2012-03-05 DIAGNOSIS — J3489 Other specified disorders of nose and nasal sinuses: Secondary | ICD-10-CM | POA: Insufficient documentation

## 2012-03-05 DIAGNOSIS — R197 Diarrhea, unspecified: Secondary | ICD-10-CM | POA: Insufficient documentation

## 2012-03-05 DIAGNOSIS — R42 Dizziness and giddiness: Secondary | ICD-10-CM | POA: Insufficient documentation

## 2012-03-05 DIAGNOSIS — R220 Localized swelling, mass and lump, head: Secondary | ICD-10-CM | POA: Insufficient documentation

## 2012-03-05 DIAGNOSIS — R51 Headache: Secondary | ICD-10-CM | POA: Insufficient documentation

## 2012-03-05 DIAGNOSIS — K089 Disorder of teeth and supporting structures, unspecified: Secondary | ICD-10-CM | POA: Insufficient documentation

## 2012-03-05 DIAGNOSIS — R059 Cough, unspecified: Secondary | ICD-10-CM | POA: Insufficient documentation

## 2012-03-05 DIAGNOSIS — Z79899 Other long term (current) drug therapy: Secondary | ICD-10-CM | POA: Insufficient documentation

## 2012-03-05 DIAGNOSIS — Z8701 Personal history of pneumonia (recurrent): Secondary | ICD-10-CM | POA: Insufficient documentation

## 2012-03-05 DIAGNOSIS — F17200 Nicotine dependence, unspecified, uncomplicated: Secondary | ICD-10-CM | POA: Insufficient documentation

## 2012-03-05 DIAGNOSIS — R110 Nausea: Secondary | ICD-10-CM | POA: Insufficient documentation

## 2012-03-05 DIAGNOSIS — Z8739 Personal history of other diseases of the musculoskeletal system and connective tissue: Secondary | ICD-10-CM | POA: Insufficient documentation

## 2012-03-05 DIAGNOSIS — J441 Chronic obstructive pulmonary disease with (acute) exacerbation: Secondary | ICD-10-CM | POA: Insufficient documentation

## 2012-03-05 DIAGNOSIS — R0602 Shortness of breath: Secondary | ICD-10-CM | POA: Insufficient documentation

## 2012-03-05 HISTORY — DX: Chronic obstructive pulmonary disease, unspecified: J44.9

## 2012-03-05 LAB — CBC
MCH: 32.6 pg (ref 26.0–34.0)
MCHC: 33.2 g/dL (ref 30.0–36.0)
MCV: 98.2 fL (ref 78.0–100.0)
Platelets: 240 10*3/uL (ref 150–400)

## 2012-03-05 LAB — BASIC METABOLIC PANEL
BUN: 10 mg/dL (ref 6–23)
CO2: 30 mEq/L (ref 19–32)
Calcium: 9.2 mg/dL (ref 8.4–10.5)
Creatinine, Ser: 0.64 mg/dL (ref 0.50–1.10)
GFR calc non Af Amer: >90 mL/min (ref >90)
Glucose, Bld: 95 mg/dL (ref 70–99)

## 2012-03-05 MED ORDER — IPRATROPIUM BROMIDE 0.02 % IN SOLN
0.5000 mg | Freq: Once | RESPIRATORY_TRACT | Status: AC
  Administered 2012-03-05: 0.5 mg via RESPIRATORY_TRACT
  Filled 2012-03-05: qty 2.5

## 2012-03-05 MED ORDER — PENICILLIN G BENZATHINE 1200000 UNIT/2ML IM SUSP
1.2000 10*6.[IU] | Freq: Once | INTRAMUSCULAR | Status: AC
  Administered 2012-03-05: 1.2 10*6.[IU] via INTRAMUSCULAR
  Filled 2012-03-05: qty 2

## 2012-03-05 MED ORDER — ALBUTEROL SULFATE (5 MG/ML) 0.5% IN NEBU
5.0000 mg | INHALATION_SOLUTION | Freq: Once | RESPIRATORY_TRACT | Status: AC
  Administered 2012-03-05: 5 mg via RESPIRATORY_TRACT
  Filled 2012-03-05: qty 1

## 2012-03-05 MED ORDER — HYDROCODONE-ACETAMINOPHEN 5-325 MG PO TABS: ORAL_TABLET | ORAL | Status: DC

## 2012-03-05 MED ORDER — HYDROCODONE-ACETAMINOPHEN 5-325 MG PO TABS
2.0000 | ORAL_TABLET | Freq: Once | ORAL | Status: AC
  Administered 2012-03-05: 2 via ORAL
  Filled 2012-03-05: qty 2

## 2012-03-05 MED ORDER — ALBUTEROL SULFATE HFA 108 (90 BASE) MCG/ACT IN AERS: INHALATION_SPRAY | RESPIRATORY_TRACT | Status: DC

## 2012-03-05 MED ORDER — GUAIFENESIN-CODEINE 100-10 MG/5ML PO SYRP: 5.0000 mL | ORAL_SOLUTION | Freq: Three times a day (TID) | ORAL | Status: DC | PRN

## 2012-03-05 NOTE — ED Notes (Signed)
Pt c/o left upper dental pain. Left side of face is mildly swollen. Pt states she is also having sob, sore throat, productive cough. No resp distress noted at this time.

## 2012-03-05 NOTE — ED Provider Notes (Signed)
History     CSN: [id number]  Arrival date & time 03/05/12  1730   First MD Initiated Contact with Patient 03/05/12 1952      Chief Complaint  Patient presents with  . Dental Pain  . Shortness of Breath    (Consider location/radiation/quality/duration/timing/severity/associated sxs/prior treatment) HPI Comments: 57 y.o. Female complaining of shortness of breath for the past 3 days. Admits productive cough (worse at night), sore throat, headache (frontal, resolves with Advil), nausea, rhinorrea. Denies  Fever, chest pain, sick contacts, abdominal pain, chest pain, vomiting, numbness. Pt took no interventions for shortness of breath, is out of inhaler. Pt states she has had these symptoms before and was helped with breathing treatment and inhaler.   Patient is a 57 y.o. female presenting with tooth pain and shortness of breath.  Dental PainThe primary symptoms include headaches, shortness of breath and sore throat. Primary symptoms do not include fever.  The headache is not associated with photophobia, neck stiffness or weakness.  Additional symptoms include: facial swelling. Additional symptoms do not include: ear pain.   Shortness of Breath Associated symptoms: headaches, sore throat and wheezing   Associated symptoms: no abdominal pain, no chest pain, no diaphoresis, no ear pain, no fever, no neck pain, no rash and no vomiting     Past Medical History  Diagnosis Date  . Hypertension   . Pneumonia   . Bronchitis   . Arthritis   . COPD (chronic obstructive pulmonary disease)     Past Surgical History  Procedure Laterality Date  . Cesarean section      History reviewed. No pertinent family history.  History  Substance Use Topics  . Smoking status: Current Every Day Smoker -- 0.50 packs/day  . Smokeless tobacco: Not on file  . Alcohol Use: No    OB History   Grav Para Term Preterm Abortions TAB SAB Ect Mult Living                  Review of Systems    Constitutional: Negative for fever and diaphoresis.  HENT: Positive for sore throat, facial swelling and dental problem. Negative for ear pain, neck pain, neck stiffness and sinus pressure.        Left side  Eyes: Negative for photophobia and visual disturbance.  Respiratory: Positive for shortness of breath and wheezing. Negative for apnea and chest tightness.   Cardiovascular: Negative for chest pain and palpitations.  Gastrointestinal: Positive for diarrhea. Negative for nausea, vomiting, abdominal pain and constipation.       Few episodes of diarrhea over the past 3 days  Genitourinary: Negative for dysuria.  Musculoskeletal: Negative for gait problem.  Skin: Negative for rash.  Neurological: Positive for dizziness and headaches. Negative for weakness, light-headedness and numbness.       Frontal headache resolves with Advil    Allergies  Review of patient's allergies indicates no known allergies.  Home Medications   Current Outpatient Rx  Name  Route  Sig  Dispense  Refill  . albuterol (PROVENTIL HFA;VENTOLIN HFA) 108 (90 BASE) MCG/ACT inhaler   Inhalation   Inhale 2 puffs into the lungs every 6 (six) hours as needed. For shortness of breath          . ALPRAZolam (XANAX) 0.5 MG tablet   Oral   Take 0.5 mg by mouth 3 (three) times daily as needed for sleep or anxiety.         [name] ipratropium-albuterol (DUONEB) 0.5-2.5 (3) MG/3ML SOLN  Nebulization   Take 3 mLs by nebulization every 6 (six) hours as needed (wheezing).         [name] lisinopril (PRINIVIL,ZESTRIL) 20 MG tablet   Oral   Take 20 mg by mouth daily.           . metoprolol (LOPRESSOR) 50 MG tablet   Oral   Take 50 mg by mouth 2 (two) times daily.           [name] PARoxetine (PAXIL) 20 MG tablet   Oral   Take 40 mg by mouth every morning.             BP 124/91  Pulse 76  Temp(Src) 98.6 F (37 C) (Oral)  Resp 18  SpO2 97%  Physical Exam  Nursing note and vitals reviewed. Constitutional: She is  oriented to person, place, and time. She appears well-developed and well-nourished. No distress.  HENT:  Head: Normocephalic and atraumatic. No trismus in the jaw.  Mouth/Throat: Uvula is midline. Edematous present. Posterior oropharyngeal erythema present. No oropharyngeal exudate or tonsillar abscesses.    Last tooth on bottom left is cracked. There is mild swelling at the site. Tooth is tender to touch. No pus. No abscess. No bleeding.   Eyes: EOM are normal. Pupils are equal, round, and reactive to light.  Neck: Normal range of motion. Neck supple.  No meningeal signs  Cardiovascular: Normal rate, regular rhythm and normal heart sounds.  Exam reveals no gallop and no friction rub.   No murmur heard. Pulmonary/Chest: Effort normal. No respiratory distress. She has wheezes. She has no rales. She exhibits no tenderness.  Bilateral wheezes  Abdominal: Soft. Bowel sounds are normal. She exhibits no distension. There is no tenderness. There is no rebound and no guarding.  Musculoskeletal: Normal range of motion. She exhibits no edema and no tenderness.  Neurological: She is alert and oriented to person, place, and time. No cranial nerve deficit.  Skin: Skin is warm and dry. She is not diaphoretic. No erythema.    ED Course  Procedures (including critical care time)  Labs Reviewed  BASIC METABOLIC PANEL  CBC   Results for orders placed during the hospital encounter of 03/05/12  BASIC METABOLIC PANEL      Result Value Range   Sodium 140  135 - 145 mEq/L   Potassium 3.6  3.5 - 5.1 mEq/L   Chloride 102  96 - 112 mEq/L   CO2 30  19 - 32 mEq/L   Glucose, Bld 95  70 - 99 mg/dL   BUN 10  6 - 23 mg/dL   Creatinine, Ser 4.54  0.50 - 1.10 mg/dL   Calcium 9.2  8.4 - 09.8 mg/dL   GFR calc non Af Amer >90  >90 mL/min   GFR calc Af Amer >90  >90 mL/min  CBC      Result Value Range   WBC 5.7  4.0 - 10.5 K/uL   RBC 3.93  3.87 - 5.11 MIL/uL   Hemoglobin 12.8  12.0 - 15.0 g/dL   HCT 11.9   14.7 - 82.9 %   MCV 98.2  78.0 - 100.0 fL   MCH 32.6  26.0 - 34.0 pg   MCHC 33.2  30.0 - 36.0 g/dL   RDW 56.2  13.0 - 86.5 %   Platelets 240  150 - 400 K/uL   Dg Chest 2 View (if Patient Has Fever And/or Copd)  03/05/2012  *RADIOLOGY REPORT*  Clinical Data: Chest pain, cough, shortness  of breath  CHEST - 2 VIEW  Comparison: 01/08/2011  Findings: Lungs are clear.  No pleural effusion or pneumothorax.  Cardiomediastinal silhouette is within normal limits.  Mild degenerative changes of the visualized thoracolumbar spine.  IMPRESSION: No evidence of acute cardiopulmonary disease.   Original Report Authenticated By: Charline Bills, M.D.      Diagnosis: exacerbation of COPD, dental pain    MDM  Exacerbation of COPD vs bronchitis/pneumonia. CXR, labs, vitals not concerning for pneumonia. Lung exam improved after nebulizer treatment. Discussed benefits of smoking cessation. Patient ambulated in ED with O2 saturations maintained >90, no current signs of respiratory distress.   Pt states they are breathing at baseline. Will discharge with prescription for inhaler.  Dental pain, left sided facial swelling: Patient with toothache.  No gross abscess.  Exam unconcerning for Ludwig's angina or spread of infection.  Will treat with penicillin in the ED and pain medicine.  Urged patient to follow-up with dentist.    Provided resource list for dentist and PCP.  At this time there does not appear to be any evidence of an acute emergency medical condition and the patient appears stable for discharge with appropriate outpatient follow up.Diagnosis was discussed with patient who verbalizes understanding and is agreeable to discharge. Pt case discussed with Dr. Rubin Payor who agrees with my plan.     Glade Nurse, PA-C 03/06/12 [phone]

## 2012-03-07 NOTE — ED Provider Notes (Signed)
Medical screening examination/treatment/procedure(s) were performed by non-physician practitioner and as supervising physician I was immediately available for consultation/collaboration.  Naavya Postma R. Kline Bulthuis, MD 03/07/12 0419 

## 2012-03-24 ENCOUNTER — Other Ambulatory Visit: Payer: Self-pay | Admitting: Cardiovascular Disease

## 2012-03-24 ENCOUNTER — Ambulatory Visit
Admission: RE | Admit: 2012-03-24 | Discharge: 2012-03-24 | Disposition: A | Discharge status: Home / Self Care | Payer: Medicaid Other | Source: Ambulatory Visit | Attending: Cardiovascular Disease | Admitting: Cardiovascular Disease

## 2012-08-01 ENCOUNTER — Emergency Department (HOSPITAL_COMMUNITY)
Admission: EM | Admit: 2012-08-01 | Discharge: 2012-08-01 | Disposition: A | Discharge status: Home / Self Care | Payer: Medicaid Other | Source: Home / Self Care | Attending: Family Medicine | Admitting: Family Medicine

## 2012-08-01 DIAGNOSIS — N390 Urinary tract infection, site not specified: Secondary | ICD-10-CM

## 2012-08-01 LAB — POCT URINALYSIS DIP (DEVICE)
Bilirubin Urine: NEGATIVE
Glucose, UA: NEGATIVE mg/dL
Ketones, ur: NEGATIVE mg/dL
Specific Gravity, Urine: >=1.03 (ref 1.005–1.030)

## 2012-08-01 MED ORDER — FLUCONAZOLE 150 MG PO TABS: 150.0000 mg | ORAL_TABLET | Freq: Once | ORAL | Status: DC

## 2012-08-01 MED ORDER — CEPHALEXIN 500 MG PO CAPS: 500.0000 mg | ORAL_CAPSULE | Freq: Four times a day (QID) | ORAL | Status: DC

## 2012-08-01 NOTE — ED Notes (Signed)
Complaining of abd. Pain, urgency and frequency urinating, and pain in lower back.  Denies urine odor and discharge

## 2012-08-01 NOTE — ED Provider Notes (Signed)
CSN: [id number]     Arrival date & time 08/01/12  1237 History     First MD Initiated Contact with Patient 08/01/12 1317     Chief Complaint  Patient presents with  . Urinary Tract Infection   (Consider location/radiation/quality/duration/timing/severity/associated sxs/prior Treatment) Patient is a 57 y.o. female presenting with urinary tract infection. The history is provided by the patient.  Urinary Tract Infection This is a new problem. The current episode started more than 2 days ago. The problem has been gradually worsening. Associated symptoms include abdominal pain.    Past Medical History  Diagnosis Date  . Hypertension   . Pneumonia   . Bronchitis   . Arthritis   . COPD (chronic obstructive pulmonary disease)    Past Surgical History  Procedure Laterality Date  . Cesarean section     No family history on file. History  Substance Use Topics  . Smoking status: Current Every Day Smoker -- 0.50 packs/day  . Smokeless tobacco: Not on file  . Alcohol Use: No   OB History   Grav Para Term Preterm Abortions TAB SAB Ect Mult Living                 Review of Systems  Constitutional: Negative.  Negative for fever and chills.  Gastrointestinal: Positive for abdominal pain. Negative for nausea and vomiting.  Genitourinary: Positive for dysuria, urgency, frequency and pelvic pain. Negative for vaginal bleeding and vaginal discharge.    Allergies  Review of patient's allergies indicates no known allergies.  Home Medications   Current Outpatient Rx  Name  Route  Sig  Dispense  Refill  . albuterol (PROVENTIL HFA;VENTOLIN HFA) 108 (90 BASE) MCG/ACT inhaler   Inhalation   Inhale 2 puffs into the lungs every 6 (six) hours as needed. For shortness of breath          . albuterol (PROVENTIL HFA;VENTOLIN HFA) 108 (90 BASE) MCG/ACT inhaler      Take 2 puffs every 4-6 hours as needed for shortness of breath   1 Inhaler   0   . ALPRAZolam (XANAX) 0.5 MG tablet   Oral   Take 0.5 mg by mouth 3 (three) times daily as needed for sleep or anxiety.         . cephALEXin (KEFLEX) 500 MG capsule   Oral   Take 1 capsule (500 mg total) by mouth 4 (four) times daily. Take all of medicine and drink lots of fluids   20 capsule   0   . fluconazole (DIFLUCAN) 150 MG tablet   Oral   Take 1 tablet (150 mg total) by mouth once. For yeast infection   1 tablet   1   . guaiFENesin-codeine (ROBITUSSIN AC) 100-10 MG/5ML syrup   Oral   Take 5 mLs by mouth 3 (three) times daily as needed for cough.   120 mL   0   . HYDROcodone-acetaminophen (NORCO/VICODIN) 5-325 MG per tablet      Take one or two tablets every 4 to 6 hours as needed for pain   8 tablet   0   . ipratropium-albuterol (DUONEB) 0.5-2.5 (3) MG/3ML SOLN   Nebulization   Take 3 mLs by nebulization every 6 (six) hours as needed (wheezing).         [name] lisinopril (PRINIVIL,ZESTRIL) 20 MG tablet   Oral   Take 20 mg by mouth daily.           . metoprolol (LOPRESSOR) 50 MG tablet  Oral   Take 50 mg by mouth 2 (two) times daily.           [name] PARoxetine (PAXIL) 20 MG tablet   Oral   Take 40 mg by mouth every morning.            BP 118/86  Pulse 90  Temp(Src) 98.6 F (37 C) (Oral)  Resp 20  SpO2 97% Physical Exam  Nursing note and vitals reviewed. Constitutional: She is oriented to person, place, and time. She appears well-developed and well-nourished.  Abdominal: Soft. Bowel sounds are normal. She exhibits no mass. There is no tenderness.  Neurological: She is alert and oriented to person, place, and time.  Skin: Skin is warm and dry.    ED Course   Procedures (including critical care time)  Labs Reviewed  POCT URINALYSIS DIP (DEVICE) - Abnormal; Notable for the following:    Hgb urine dipstick LARGE (*)    Protein, ur 100 (*)    Nitrite POSITIVE (*)    Leukocytes, UA MODERATE (*)    All other components within normal limits   No results found. 1. UTI (lower urinary tract  infection)     MDM  U/a abnl   Linna Hoff, MD 08/01/12 1352

## 2012-08-14 ENCOUNTER — Emergency Department (HOSPITAL_COMMUNITY)
Admission: EM | Admit: 2012-08-14 | Discharge: 2012-08-14 | Disposition: A | Discharge status: Home / Self Care | Payer: Self-pay | Attending: Emergency Medicine | Admitting: Emergency Medicine

## 2012-08-14 ENCOUNTER — Encounter (HOSPITAL_COMMUNITY): Payer: Self-pay

## 2012-08-14 DIAGNOSIS — J449 Chronic obstructive pulmonary disease, unspecified: Secondary | ICD-10-CM | POA: Insufficient documentation

## 2012-08-14 DIAGNOSIS — R300 Dysuria: Secondary | ICD-10-CM | POA: Insufficient documentation

## 2012-08-14 DIAGNOSIS — F17200 Nicotine dependence, unspecified, uncomplicated: Secondary | ICD-10-CM | POA: Insufficient documentation

## 2012-08-14 DIAGNOSIS — J4489 Other specified chronic obstructive pulmonary disease: Secondary | ICD-10-CM | POA: Insufficient documentation

## 2012-08-14 DIAGNOSIS — Z8701 Personal history of pneumonia (recurrent): Secondary | ICD-10-CM | POA: Insufficient documentation

## 2012-08-14 DIAGNOSIS — Y929 Unspecified place or not applicable: Secondary | ICD-10-CM | POA: Insufficient documentation

## 2012-08-14 DIAGNOSIS — Y939 Activity, unspecified: Secondary | ICD-10-CM | POA: Insufficient documentation

## 2012-08-14 DIAGNOSIS — S66912A Strain of unspecified muscle, fascia and tendon at wrist and hand level, left hand, initial encounter: Secondary | ICD-10-CM

## 2012-08-14 DIAGNOSIS — N390 Urinary tract infection, site not specified: Secondary | ICD-10-CM | POA: Insufficient documentation

## 2012-08-14 DIAGNOSIS — Z79899 Other long term (current) drug therapy: Secondary | ICD-10-CM | POA: Insufficient documentation

## 2012-08-14 DIAGNOSIS — S63509A Unspecified sprain of unspecified wrist, initial encounter: Secondary | ICD-10-CM | POA: Insufficient documentation

## 2012-08-14 DIAGNOSIS — M129 Arthropathy, unspecified: Secondary | ICD-10-CM | POA: Insufficient documentation

## 2012-08-14 DIAGNOSIS — X58XXXA Exposure to other specified factors, initial encounter: Secondary | ICD-10-CM | POA: Insufficient documentation

## 2012-08-14 DIAGNOSIS — I10 Essential (primary) hypertension: Secondary | ICD-10-CM | POA: Insufficient documentation

## 2012-08-14 LAB — URINALYSIS, ROUTINE W REFLEX MICROSCOPIC
Glucose, UA: NEGATIVE mg/dL
Ketones, ur: NEGATIVE mg/dL
Nitrite: NEGATIVE
pH: 5.5 (ref 5.0–8.0)

## 2012-08-14 LAB — URINE MICROSCOPIC-ADD ON

## 2012-08-14 MED ORDER — PHENAZOPYRIDINE HCL 200 MG PO TABS: 200.0000 mg | ORAL_TABLET | Freq: Three times a day (TID) | ORAL | Status: DC

## 2012-08-14 MED ORDER — FLUCONAZOLE 100 MG PO TABS
150.0000 mg | ORAL_TABLET | Freq: Once | ORAL | Status: AC
  Administered 2012-08-14: 150 mg via ORAL
  Filled 2012-08-14: qty 2

## 2012-08-14 MED ORDER — OXYCODONE-ACETAMINOPHEN 5-325 MG PO TABS
1.0000 | ORAL_TABLET | Freq: Once | ORAL | Status: AC
  Administered 2012-08-14: 1 via ORAL
  Filled 2012-08-14: qty 1

## 2012-08-14 MED ORDER — SODIUM CHLORIDE 0.9 % IV BOLUS (SEPSIS): 1000.0000 mL | Freq: Once | INTRAVENOUS | Status: DC

## 2012-08-14 MED ORDER — CIPROFLOXACIN HCL 500 MG PO TABS: 500.0000 mg | ORAL_TABLET | Freq: Two times a day (BID) | ORAL | Status: DC

## 2012-08-14 MED ORDER — CIPROFLOXACIN HCL 500 MG PO TABS
500.0000 mg | ORAL_TABLET | Freq: Once | ORAL | Status: AC
  Administered 2012-08-14: 500 mg via ORAL
  Filled 2012-08-14: qty 1

## 2012-08-14 NOTE — ED Notes (Signed)
Pt c/o Left hand pain radiating up into her Left elbow x2 months, pt also c/o lower back pain and lower abd pain with urination x4 days. Pt seen at [facility] 08/01/2012, dx with a UTI prescribed ABX. Pt reports completing the ABX as prescribed

## 2012-08-14 NOTE — Progress Notes (Signed)
Orthopedic Tech Progress Note Patient Details:  Deborah Mcintyre August 07, 1955 [id number]  Ortho Devices Type of Ortho Device: Velcro wrist forearm splint Ortho Device/Splint Interventions: Application   Cammer, Mickie Bail 08/14/2012, 5:01 PM

## 2012-08-14 NOTE — ED Provider Notes (Signed)
CSN: [id number]     Arrival date & time 08/14/12  1615 History     First MD Initiated Contact with Patient 08/14/12 1631     Chief Complaint  Patient presents with  . Hand Pain  . Urinary Tract Infection   (Consider location/radiation/quality/duration/timing/severity/associated sxs/prior Treatment) The history is provided by the patient.  Deborah Mcintyre is a 57 y.o. female hx of HTN, COPD, recent UTI here with left wrist pain and dysuria. Left wrist pain worse with movement the last 2 months. 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 Pain is intermittent occasionally radiates to the left elbow but denies any trauma. She also was diagnosed with urinary tract infection a week ago and finished a course of Keflex. For last 4 days she has intermittent dysuria and frequency. Denies flank pain or fevers or vomiting.    Past Medical History  Diagnosis Date  . Hypertension   . Pneumonia   . Bronchitis   . Arthritis   . COPD (chronic obstructive pulmonary disease)    Past Surgical History  Procedure Laterality Date  . Cesarean section     History reviewed. No pertinent family history. History  Substance Use Topics  . Smoking status: Current Every Day Smoker -- 0.50 packs/day  . Smokeless tobacco: Not on file  . Alcohol Use: No   OB History   Grav Para Term Preterm Abortions TAB SAB Ect Mult Living                 Review of Systems  Genitourinary: Positive for dysuria. Negative for flank pain.  Musculoskeletal:       L wrist pain   All other systems reviewed and are negative.    Allergies  Review of patient's allergies indicates no known allergies.  Home Medications   Current Outpatient Rx  Name  Route  Sig  Dispense  Refill  . albuterol (PROVENTIL HFA;VENTOLIN HFA) 108 (90 BASE) MCG/ACT inhaler   Inhalation   Inhale 2 puffs into the lungs every 6 (six) hours as needed. For shortness of breath          . ALPRAZolam  (XANAX) 0.5 MG tablet   Oral   Take 0.5 mg by mouth 3 (three) times daily as needed for sleep or anxiety.         . conjugated estrogens (PREMARIN) vaginal cream   Vaginal   Place 0.5 g vaginally daily.         [name] HYDROcodone-acetaminophen (NORCO/VICODIN) 5-325 MG per tablet   Oral   Take 1 tablet by mouth every 6 (six) hours as needed for pain.         [name] ibuprofen (ADVIL,MOTRIN) 200 MG tablet   Oral   Take 800 mg by mouth daily as needed for pain.         [name] ipratropium-albuterol (DUONEB) 0.5-2.5 (3) MG/3ML SOLN   Nebulization   Take 3 mLs by nebulization every 6 (six) hours as needed (wheezing).         . metoprolol (LOPRESSOR) 50 MG tablet   Oral   Take 50 mg by mouth 2 (two) times daily.           [name] PARoxetine (PAXIL) 20 MG tablet   Oral   Take 40 mg by mouth every morning.            BP 128/84  Pulse 92  Temp(Src) 98.2 F (36.8 C) (Oral)  Resp  16  SpO2 92% Physical Exam  Nursing note and vitals reviewed. Constitutional: She is oriented to person, place, and time. She appears well-developed and well-nourished.  HENT:  Head: Normocephalic.  Mouth/Throat: Oropharynx is clear and moist.  Eyes: Conjunctivae are normal. Pupils are equal, round, and reactive to light.  Neck: Normal range of motion. Neck supple.  Cardiovascular: Normal rate, regular rhythm and normal heart sounds.   Pulmonary/Chest: Effort normal and breath sounds normal. No respiratory distress. She has no wheezes. She has no rales.  Abdominal: Soft. Bowel sounds are normal.  Mild suprapubic tenderness. No CVAT   Musculoskeletal: Normal range of motion.  L wrist nl ROM. Mild tenderness along ulna tendons.   Neurological: She is alert and oriented to person, place, and time.  Skin: Skin is warm and dry.  Psychiatric: She has a normal mood and affect. Her behavior is normal. Judgment and thought content normal.    ED Course   Procedures (including critical care time)  Labs Reviewed   URINALYSIS, ROUTINE W REFLEX MICROSCOPIC - Abnormal; Notable for the following:    Color, Urine AMBER (*)    APPearance CLOUDY (*)    Hgb urine dipstick MODERATE (*)    Leukocytes, UA MODERATE (*)    All other components within normal limits  URINE MICROSCOPIC-ADD ON - Abnormal; Notable for the following:    Squamous Epithelial / LPF MANY (*)    Bacteria, UA MANY (*)    All other components within normal limits  URINE CULTURE   No results found. No diagnosis found.  MDM  Deborah Mcintyre is a 57 y.o. female here with L wrist pain and dysuria. L wrist pain likely from overuse. No signs of trauma so no need for xray. Will apply splint and I told her to not use the arm as much. Will get UA to r/o UTI. Was tachycardic at triage, but on the monitor, not tachy.  Not septic appearing and I doubt pyelo.   6:56 PM UA + UTI. Previous urine culture sensitive to cipro. Will give cipro for 10 days. Will also give pyridium for symptomatic relief. Will have her f/u with ortho regarding wrist.   Richardean Canal, MD 08/14/12 [phone]

## 2012-08-16 LAB — URINE CULTURE

## 2012-08-17 NOTE — ED Notes (Signed)
+   Urine Culture Patient treated per protocol MD.  

## 2012-12-04 ENCOUNTER — Emergency Department (HOSPITAL_COMMUNITY)
Admission: EM | Admit: 2012-12-04 | Discharge: 2012-12-05 | Disposition: A | Discharge status: Home / Self Care | Payer: Medicare Other | Attending: Emergency Medicine | Admitting: Emergency Medicine

## 2012-12-04 ENCOUNTER — Emergency Department (HOSPITAL_COMMUNITY): Payer: Medicare Other

## 2012-12-04 ENCOUNTER — Encounter (HOSPITAL_COMMUNITY): Payer: Self-pay | Admitting: Emergency Medicine

## 2012-12-04 DIAGNOSIS — Y9389 Activity, other specified: Secondary | ICD-10-CM | POA: Insufficient documentation

## 2012-12-04 DIAGNOSIS — Y92009 Unspecified place in unspecified non-institutional (private) residence as the place of occurrence of the external cause: Secondary | ICD-10-CM | POA: Insufficient documentation

## 2012-12-04 DIAGNOSIS — J4489 Other specified chronic obstructive pulmonary disease: Secondary | ICD-10-CM | POA: Insufficient documentation

## 2012-12-04 DIAGNOSIS — Z8701 Personal history of pneumonia (recurrent): Secondary | ICD-10-CM | POA: Insufficient documentation

## 2012-12-04 DIAGNOSIS — S0990XA Unspecified injury of head, initial encounter: Secondary | ICD-10-CM

## 2012-12-04 DIAGNOSIS — J449 Chronic obstructive pulmonary disease, unspecified: Secondary | ICD-10-CM | POA: Insufficient documentation

## 2012-12-04 DIAGNOSIS — F17200 Nicotine dependence, unspecified, uncomplicated: Secondary | ICD-10-CM | POA: Insufficient documentation

## 2012-12-04 DIAGNOSIS — I10 Essential (primary) hypertension: Secondary | ICD-10-CM | POA: Insufficient documentation

## 2012-12-04 DIAGNOSIS — M129 Arthropathy, unspecified: Secondary | ICD-10-CM | POA: Insufficient documentation

## 2012-12-04 DIAGNOSIS — S6990XA Unspecified injury of unspecified wrist, hand and finger(s), initial encounter: Secondary | ICD-10-CM | POA: Insufficient documentation

## 2012-12-04 DIAGNOSIS — S79919A Unspecified injury of unspecified hip, initial encounter: Secondary | ICD-10-CM | POA: Insufficient documentation

## 2012-12-04 DIAGNOSIS — W208XXA Other cause of strike by thrown, projected or falling object, initial encounter: Secondary | ICD-10-CM | POA: Insufficient documentation

## 2012-12-04 DIAGNOSIS — Z79899 Other long term (current) drug therapy: Secondary | ICD-10-CM | POA: Insufficient documentation

## 2012-12-04 MED ORDER — HYDROCODONE-ACETAMINOPHEN 5-325 MG PO TABS
1.0000 | ORAL_TABLET | Freq: Once | ORAL | Status: AC
  Administered 2012-12-04: 1 via ORAL
  Filled 2012-12-04: qty 1

## 2012-12-04 NOTE — ED Provider Notes (Signed)
CSN: [id number]     Arrival date & time 12/04/12  1952 History   First MD Initiated Contact with Patient 12/04/12 2203     Chief Complaint  Patient presents with  . Head Injury   (Consider location/radiation/quality/duration/timing/severity/associated sxs/prior Treatment) HPI  57 year old female presents complaining of pain in her head, left hand, and left knee when a wall cabinet fell on her 3 days ago. Patient states she was at her brother's house for Thanksgiving, when he walked tablet came off the hand, hitting her head. She was able to use both of her hand to support the cabinet as well as her left knee bracing against the wall to prevent from falling.  Her brother and another friend were able to support it back into place.  Cabinet did not fall down.  Pt denies any loss of consciousness. Afterward she experiencing sharp pain to the top of her head, left hand, and left knee. She has been able to ambulate with a limp. She has been taking ibuprofen at home with minimal relief. Today while walking she felt a bit disoriented and decided to come to the ER for further evaluation. Otherwise patient denies double vision, neck pain, new numbness or weakness. Her pain is currently a 4 of 10. She did not think she had broken any bones.  Past Medical History  Diagnosis Date  . Hypertension   . Pneumonia   . Bronchitis   . Arthritis   . COPD (chronic obstructive pulmonary disease)    Past Surgical History  Procedure Laterality Date  . Cesarean section     No family history on file. History  Substance Use Topics  . Smoking status: Current Every Day Smoker -- 0.50 packs/day  . Smokeless tobacco: Not on file  . Alcohol Use: No   OB History   Grav Para Term Preterm Abortions TAB SAB Ect Mult Living                 Review of Systems  All other systems reviewed and are negative.    Allergies  Review of patient's allergies indicates no known allergies.  Home Medications   Current  Outpatient Rx  Name  Route  Sig  Dispense  Refill  . albuterol (PROVENTIL HFA;VENTOLIN HFA) 108 (90 BASE) MCG/ACT inhaler   Inhalation   Inhale 2 puffs into the lungs every 6 (six) hours as needed. For shortness of breath          . ALPRAZolam (XANAX) 0.5 MG tablet   Oral   Take 0.5 mg by mouth 3 (three) times daily as needed for sleep or anxiety.         . Aspirin-Salicylamide-Caffeine (BC HEADACHE POWDER PO)   Oral   Take 1 packet by mouth daily as needed (for pain).         . cycloSPORINE (RESTASIS) 0.05 % ophthalmic emulsion   Both Eyes   Place 1 drop into both eyes 2 (two) times daily.         [name] ibuprofen (ADVIL,MOTRIN) 200 MG tablet   Oral   Take 800 mg by mouth daily as needed for pain.         [name] ipratropium-albuterol (DUONEB) 0.5-2.5 (3) MG/3ML SOLN   Nebulization   Take 3 mLs by nebulization every 6 (six) hours as needed (wheezing).         . metoprolol (LOPRESSOR) 50 MG tablet   Oral   Take 50 mg by mouth 2 (two) times daily.           [name]  PARoxetine (PAXIL) 20 MG tablet   Oral   Take 40 mg by mouth every morning.            BP 128/77  Pulse 90  Temp(Src) 98.3 F (36.8 C) (Oral)  Resp 18  Wt 219 lb 4.8 oz (99.474 kg)  SpO2 97% Physical Exam  Nursing note and vitals reviewed. Constitutional: She is oriented to person, place, and time. She appears well-developed and well-nourished. No distress.  HENT:  Head: Atraumatic.  Tenderness the vertex of head without any overlying skin changes, bruising, or laceration. No crepitus.  Eyes: Conjunctivae are normal.  Neck: Neck supple.  Musculoskeletal: She exhibits tenderness (mild tenderness to left hand overlying the palm of hand and the left thumb with normal grip and no deformity. Tenderness to left anterior knee with normal flexion and extension and no deformity.).  Neurological: She is alert and oriented to person, place, and time.  Patient is alert and oriented, speech is clear and goal oriented,  ambulate without difficulty.  Skin: No rash noted.  Psychiatric: She has a normal mood and affect.    ED Course  Procedures (including critical care time) Labs Review Labs Reviewed - No data to display Imaging Review No results found.  EKG Interpretation   None       MDM   1. Minor head injury without loss of consciousness, initial encounter    BP 129/74  Pulse 82  Temp(Src) 98.3 F (36.8 C) (Oral)  Resp 16  Wt 219 lb 4.8 oz (99.474 kg)  SpO2 91%      Fayrene Helper, PA-C 12/07/12 1007

## 2012-12-04 NOTE — ED Notes (Signed)
Pt. reports wall cabinet fell on her head last Thursday at home , no LOC , presents with headache , left hand and left knee pain , ambulatory , alert and oriented / respirations unlabored. Slight nausea.

## 2012-12-04 NOTE — ED Notes (Signed)
Pt in CT.

## 2012-12-05 MED ORDER — HYDROCODONE-ACETAMINOPHEN 5-325 MG PO TABS: 1.0000 | ORAL_TABLET | ORAL | Status: DC | PRN

## 2012-12-07 NOTE — ED Provider Notes (Signed)
Medical screening examination/treatment/procedure(s) were performed by non-physician practitioner and as supervising physician I was immediately available for consultation/collaboration.  EKG Interpretation   None        Adline Kirshenbaum R. Saddie Sandeen, MD 12/07/12 1524 

## 2013-02-20 ENCOUNTER — Encounter: Payer: Medicare Other | Admitting: Obstetrics & Gynecology

## 2013-07-25 ENCOUNTER — Encounter (HOSPITAL_COMMUNITY): Payer: Self-pay | Admitting: Emergency Medicine

## 2013-07-25 ENCOUNTER — Emergency Department (INDEPENDENT_AMBULATORY_CARE_PROVIDER_SITE_OTHER)
Admission: EM | Admit: 2013-07-25 | Discharge: 2013-07-25 | Disposition: A | Discharge status: Home / Self Care | Payer: Medicare Other | Source: Home / Self Care

## 2013-07-25 DIAGNOSIS — J449 Chronic obstructive pulmonary disease, unspecified: Secondary | ICD-10-CM

## 2013-07-25 DIAGNOSIS — R21 Rash and other nonspecific skin eruption: Secondary | ICD-10-CM

## 2013-07-25 DIAGNOSIS — R05 Cough: Secondary | ICD-10-CM

## 2013-07-25 DIAGNOSIS — R059 Cough, unspecified: Secondary | ICD-10-CM

## 2013-07-25 MED ORDER — FLUOCINOLONE ACETONIDE 0.01 % EX SOLN: Freq: Two times a day (BID) | CUTANEOUS | Status: DC

## 2013-07-25 MED ORDER — METHYLPREDNISOLONE ACETATE 80 MG/ML IJ SUSP
INTRAMUSCULAR | Status: AC
  Filled 2013-07-25: qty 1

## 2013-07-25 MED ORDER — METHYLPREDNISOLONE ACETATE 80 MG/ML IJ SUSP
80.0000 mg | Freq: Once | INTRAMUSCULAR | Status: AC
  Administered 2013-07-25: 80 mg via INTRAMUSCULAR

## 2013-07-25 MED ORDER — HYDROXYZINE HCL 25 MG PO TABS: 25.0000 mg | ORAL_TABLET | Freq: Four times a day (QID) | ORAL | Status: DC

## 2013-07-25 NOTE — ED Provider Notes (Signed)
CSN: [id number]     Arrival date & time 07/25/13  1603 History   None    Chief Complaint  Patient presents with  . Rash  . Cough   (Consider location/radiation/quality/duration/timing/severity/associated sxs/prior Treatment) HPI Comments: Patient presents with a rash x 4 days. She used a new hair treatment, and following this she began to have itching and "sore" in the scalp. After a few days this was noted on her upper arms and chest. Mildly on the legs. "ver itchy". No new meds. A new issue.  Also reports a 3 day history of non-productive cough. Known COPD; and reports no fever or chills. Non-productive. No URI symptoms. "feels like early COPD flare".   Patient is a 58 y.o. female presenting with rash and cough. The history is provided by the patient.  Rash Associated symptoms: no fatigue, no fever, no shortness of breath and not wheezing   Cough Associated symptoms: rash   Associated symptoms: no chills, no fever, no shortness of breath and no wheezing     Past Medical History  Diagnosis Date  . Hypertension   . Pneumonia   . Bronchitis   . Arthritis   . COPD (chronic obstructive pulmonary disease)    Past Surgical History  Procedure Laterality Date  . Cesarean section     History reviewed. No pertinent family history. History  Substance Use Topics  . Smoking status: Current Every Day Smoker -- 0.50 packs/day  . Smokeless tobacco: Not on file  . Alcohol Use: No   OB History   Grav Para Term Preterm Abortions TAB SAB Ect Mult Living                 Review of Systems  Constitutional: Negative for fever, chills and fatigue.  HENT: Negative.   Respiratory: Positive for cough. Negative for chest tightness, shortness of breath and wheezing.   Cardiovascular: Negative.   Skin: Positive for rash. Negative for pallor.  All other systems reviewed and are negative.   Allergies  Review of patient's allergies indicates no known allergies.  Home Medications   Prior to  Admission medications   Medication Sig Start Date End Date Taking? Authorizing Provider  albuterol (PROVENTIL HFA;VENTOLIN HFA) 108 (90 BASE) MCG/ACT inhaler Inhale 2 puffs into the lungs every 6 (six) hours as needed. For shortness of breath     Historical Provider, MD  ALPRAZolam Duanne Moron) 0.5 MG tablet Take 0.5 mg by mouth 3 (three) times daily as needed for sleep or anxiety.    Historical Provider, MD  Aspirin-Salicylamide-Caffeine (BC HEADACHE POWDER PO) Take 1 packet by mouth daily as needed (for pain).    Historical Provider, MD  cycloSPORINE (RESTASIS) 0.05 % ophthalmic emulsion Place 1 drop into both eyes 2 (two) times daily.    Historical Provider, MD  HYDROcodone-acetaminophen (NORCO/VICODIN) 5-325 MG per tablet Take 1 tablet by mouth every 4 (four) hours as needed. 12/04/12   Domenic Moras, PA-C  ibuprofen (ADVIL,MOTRIN) 200 MG tablet Take 800 mg by mouth daily as needed for pain.    Historical Provider, MD  ipratropium-albuterol (DUONEB) 0.5-2.5 (3) MG/3ML SOLN Take 3 mLs by nebulization every 6 (six) hours as needed (wheezing).    Historical Provider, MD  metoprolol (LOPRESSOR) 50 MG tablet Take 50 mg by mouth 2 (two) times daily.      Historical Provider, MD  PARoxetine (PAXIL) 20 MG tablet Take 40 mg by mouth every morning.      Historical Provider, MD   BP 146/92  Pulse 74  Temp(Src) 98.2 F (36.8 C) (Oral)  Resp 20  SpO2 94% Physical Exam  Nursing note and vitals reviewed. Constitutional: She is oriented to person, place, and time. She appears well-developed and well-nourished.  HENT:  Head: Normocephalic and atraumatic.  Neck: Normal range of motion. Neck supple.  Cardiovascular: Normal rate, regular rhythm and normal heart sounds.   Pulmonary/Chest: Effort normal and breath sounds normal. No respiratory distress. She has no wheezes.  Neurological: She is alert and oriented to person, place, and time.  Skin: Skin is warm and dry. Rash noted.  Scalp thickening dry patches,  erythema, mild excoriations, upper extremities and truck with small macular rash with erythematous base. Mild excoriations. No signs of secondary infection.  Psychiatric: Her behavior is normal.    ED Course  Procedures (including critical care time) Labs Review Labs Reviewed - No data to display  Imaging Review No results found.   MDM   1. Rash and nonspecific skin eruption   2. Cough   3. Chronic obstructive pulmonary disease, unspecified COPD, unspecified chronic bronchitis type    1. Treat with IM Depo, Hydroxyzine as needed. Cool baths. Low Potency steroid shampoo for only 5 days.  2. Exam normal and no signs of infection. Treat symptomatically with Delsym. Continue Inhalers, and IM Depo may help this as well.     Bjorn Pippin, PA-C 07/25/13 1722

## 2013-07-25 NOTE — ED Provider Notes (Signed)
Medical screening examination/treatment/procedure(s) were performed by a resident physician or non-physician practitioner and as the supervising physician I was immediately available for consultation/collaboration.  Linna Darner, MD Family Medicine   Waldemar Dickens, MD 07/25/13 2111

## 2013-07-25 NOTE — Discharge Instructions (Signed)

## 2013-07-25 NOTE — ED Notes (Signed)
C/o rash all over body that does itch States she has sores in her head when she used a conditioner Peroxide and alcohol was used as tx

## 2013-07-30 ENCOUNTER — Encounter (HOSPITAL_COMMUNITY): Payer: Self-pay | Admitting: Emergency Medicine

## 2013-07-30 ENCOUNTER — Emergency Department (HOSPITAL_COMMUNITY)
Admission: EM | Admit: 2013-07-30 | Discharge: 2013-07-30 | Disposition: A | Discharge status: Home / Self Care | Payer: Medicare Other | Attending: Emergency Medicine | Admitting: Emergency Medicine

## 2013-07-30 DIAGNOSIS — F17200 Nicotine dependence, unspecified, uncomplicated: Secondary | ICD-10-CM | POA: Insufficient documentation

## 2013-07-30 DIAGNOSIS — IMO0002 Reserved for concepts with insufficient information to code with codable children: Secondary | ICD-10-CM | POA: Diagnosis not present

## 2013-07-30 DIAGNOSIS — I10 Essential (primary) hypertension: Secondary | ICD-10-CM | POA: Diagnosis not present

## 2013-07-30 DIAGNOSIS — L259 Unspecified contact dermatitis, unspecified cause: Secondary | ICD-10-CM | POA: Diagnosis not present

## 2013-07-30 DIAGNOSIS — J209 Acute bronchitis, unspecified: Secondary | ICD-10-CM | POA: Insufficient documentation

## 2013-07-30 DIAGNOSIS — Z8701 Personal history of pneumonia (recurrent): Secondary | ICD-10-CM | POA: Diagnosis not present

## 2013-07-30 DIAGNOSIS — Z79899 Other long term (current) drug therapy: Secondary | ICD-10-CM | POA: Insufficient documentation

## 2013-07-30 DIAGNOSIS — J440 Chronic obstructive pulmonary disease with acute lower respiratory infection: Secondary | ICD-10-CM | POA: Insufficient documentation

## 2013-07-30 DIAGNOSIS — L239 Allergic contact dermatitis, unspecified cause: Secondary | ICD-10-CM

## 2013-07-30 DIAGNOSIS — N390 Urinary tract infection, site not specified: Secondary | ICD-10-CM | POA: Insufficient documentation

## 2013-07-30 DIAGNOSIS — M129 Arthropathy, unspecified: Secondary | ICD-10-CM | POA: Insufficient documentation

## 2013-07-30 LAB — URINALYSIS, ROUTINE W REFLEX MICROSCOPIC
Bilirubin Urine: NEGATIVE
GLUCOSE, UA: NEGATIVE mg/dL
Ketones, ur: 15 mg/dL — AB
Nitrite: NEGATIVE
PROTEIN: NEGATIVE mg/dL
Specific Gravity, Urine: 1.025 (ref 1.005–1.030)
Urobilinogen, UA: 1 mg/dL (ref 0.0–1.0)
pH: 5.5 (ref 5.0–8.0)

## 2013-07-30 LAB — URINE MICROSCOPIC-ADD ON

## 2013-07-30 LAB — WET PREP, GENITAL
Clue Cells Wet Prep HPF POC: NONE SEEN
Trich, Wet Prep: NONE SEEN
WBC, Wet Prep HPF POC: NONE SEEN
YEAST WET PREP: NONE SEEN

## 2013-07-30 MED ORDER — PREDNISONE 20 MG PO TABS
60.0000 mg | ORAL_TABLET | Freq: Once | ORAL | Status: AC
  Administered 2013-07-30: 60 mg via ORAL
  Filled 2013-07-30: qty 3

## 2013-07-30 MED ORDER — CEPHALEXIN 500 MG PO CAPS: 500.0000 mg | ORAL_CAPSULE | Freq: Three times a day (TID) | ORAL | Status: DC

## 2013-07-30 MED ORDER — PREDNISONE 20 MG PO TABS: 40.0000 mg | ORAL_TABLET | Freq: Every day | ORAL | Status: DC

## 2013-07-30 MED ORDER — DIPHENHYDRAMINE HCL 25 MG PO TABS: 25.0000 mg | ORAL_TABLET | Freq: Four times a day (QID) | ORAL | Status: DC | PRN

## 2013-07-30 MED ORDER — PHENAZOPYRIDINE HCL 200 MG PO TABS: 200.0000 mg | ORAL_TABLET | Freq: Three times a day (TID) | ORAL | Status: DC

## 2013-07-30 NOTE — ED Notes (Signed)
Declined W/C at D/C and was escorted to lobby by RN. 

## 2013-07-30 NOTE — Discharge Instructions (Signed)
You have been seen today for your complaint of pain with urination. Your lab work showed urine infection. Your discharge medications include: 1) Keflex Please take all of your antibiotics until finished!   You may develop abdominal discomfort or diarrhea from the antibiotic.  You may help offset this with probiotics which you can buy or get in yogurt. Do not eat  or take the probiotics until 2 hours after your antibiotic.  2) Pyridium This medication will help relieve pain and burning but does not treat the infection.  Make sure that you wear a panty liner as it may stain your underwear. Home care instructions are as follows:  1) please drink plenty of water, avoid tea and beverages with caffeine like coffee or soda 2) if you are sexually active, ,make sure to urinate immediately after intercourse Follow up with: your doctor or the emergency department Please seek immediate medical care if you develop any of the following symptoms: SEEK MEDICAL CARE IF:  You have back pain.  You develop a fever.  Your symptoms do not begin to resolve within 3 days.  SEEK IMMEDIATE MEDICAL CARE IF:  You have severe back pain or lower abdominal pain.  You develop chills.  You have nausea or vomiting.  You have continued burning or discomfort with urination.     Contact Dermatitis Contact dermatitis is a reaction to certain substances that touch the skin. Contact dermatitis can be either irritant contact dermatitis or allergic contact dermatitis. Irritant contact dermatitis does not require previous exposure to the substance for a reaction to occur.Allergic contact dermatitis only occurs if you have been exposed to the substance before. Upon a repeat exposure, your body reacts to the substance.  CAUSES  Many substances can cause contact dermatitis. Irritant dermatitis is most commonly caused by repeated exposure to mildly irritating substances, such  as:  Makeup.  Soaps.  Detergents.  Bleaches.  Acids.  Metal salts, such as nickel. Allergic contact dermatitis is most commonly caused by exposure to:  Poisonous plants.  Chemicals (deodorants, shampoos).  Jewelry.  Latex.  Neomycin in triple antibiotic cream.  Preservatives in products, including clothing. SYMPTOMS  The area of skin that is exposed may develop:  Dryness or flaking.  Redness.  Cracks.  Itching.  Pain or a burning sensation.  Blisters. With allergic contact dermatitis, there may also be swelling in areas such as the eyelids, mouth, or genitals.  DIAGNOSIS  Your caregiver can usually tell what the problem is by doing a physical exam. In cases where the cause is uncertain and an allergic contact dermatitis is suspected, a patch skin test may be performed to help determine the cause of your dermatitis. TREATMENT Treatment includes protecting the skin from further contact with the irritating substance by avoiding that substance if possible. Barrier creams, powders, and gloves may be helpful. Your caregiver may also recommend:  Steroid creams or ointments applied 2 times daily. For best results, soak the rash area in cool water for 20 minutes. Then apply the medicine. Cover the area with a plastic wrap. You can store the steroid cream in the refrigerator for a "chilly" effect on your rash. That may decrease itching. Oral steroid medicines may be needed in more severe cases.  Antibiotics or antibacterial ointments if a skin infection is present.  Antihistamine lotion or an antihistamine taken by mouth to ease itching.  Lubricants to keep moisture in your skin.  Burow's solution to reduce redness and soreness or to dry a weeping rash.  Mix one packet or tablet of solution in 2 cups cool water. Dip a clean washcloth in the mixture, wring it out a bit, and put it on the affected area. Leave the cloth in place for 30 minutes. Do this as often as possible  throughout the day.  Taking several cornstarch or baking soda baths daily if the area is too large to cover with a washcloth. Harsh chemicals, such as alkalis or acids, can cause skin damage that is like a burn. You should flush your skin for 15 to 20 minutes with cold water after such an exposure. You should also seek immediate medical care after exposure. Bandages (dressings), antibiotics, and pain medicine may be needed for severely irritated skin.  HOME CARE INSTRUCTIONS  Avoid the substance that caused your reaction.  Keep the area of skin that is affected away from hot water, soap, sunlight, chemicals, acidic substances, or anything else that would irritate your skin.  Do not scratch the rash. Scratching may cause the rash to become infected.  You may take cool baths to help stop the itching.  Only take over-the-counter or prescription medicines as directed by your caregiver.  See your caregiver for follow-up care as directed to make sure your skin is healing properly. SEEK MEDICAL CARE IF:   Your condition is not better after 3 days of treatment.  You seem to be getting worse.  You see signs of infection such as swelling, tenderness, redness, soreness, or warmth in the affected area.  You have any problems related to your medicines. Document Released: 12/20/1999 Document Revised: 03/16/2011 Document Reviewed: 05/27/2010 [facility] Patient Information 2015 [place], [place]. This information is not intended to replace advice given to you by your health care provider. Make sure you discuss any questions you have with your health care provider.

## 2013-07-30 NOTE — ED Notes (Signed)
She states shes had burning with urination this week. She states shes had 3 utis this year and this feels the same. She also states "ive had a rash and scabs all over my body for months. Urgent care gave me some prednsion for it but i couldn't afford to get it filled."

## 2013-07-30 NOTE — ED Provider Notes (Signed)
CSN: [id number]     Arrival date & time 07/30/13  1453 History  This chart was scribed for non-physician provider Margarita Mail, PA-C, working with Osvaldo Shipper, MD by Irene Pap, ED Scribe. This patient was seen in room [facility] and patient care was started at 5:13 PM.     Chief Complaint  Patient presents with  . Urinary Tract Infection  . Rash   Patient is a 58 y.o. female presenting with urinary tract infection and rash. The history is provided by the patient. No language interpreter was used.  Urinary Tract Infection Pertinent negatives include no abdominal pain.  Rash Associated symptoms: no abdominal pain, no fever, no nausea and not vomiting    HPI Comments: Deborah Mcintyre is a 58 y.o. female who presents to the Emergency Department complaining of UTI onset two weeks ago. She states that she dyed her hair and left the conditioner in for too long and received sores on her head that have been very itchy and flaking with associated burning pain. She reports that she also has burning with urination. She reports that this is her 4th UTI this year. She states that she was seen at Urgent Care for her rash but was unable to pay for the medication. She reports vaginal discharge, dyspareunia, foul odor, and itching. She reports one sexual partner. She denies fever, nausea, vomiting, chills, or abdominal pain. She denies a history of diabetes.   Past Medical History  Diagnosis Date  . Hypertension   . Pneumonia   . Bronchitis   . Arthritis   . COPD (chronic obstructive pulmonary disease)    Past Surgical History  Procedure Laterality Date  . Cesarean section     History reviewed. No pertinent family history. History  Substance Use Topics  . Smoking status: Current Every Day Smoker -- 0.50 packs/day  . Smokeless tobacco: Not on file  . Alcohol Use: No   OB History   Grav Para Term Preterm Abortions TAB SAB Ect Mult Living                 Review of Systems   Constitutional: Negative for fever and chills.  Gastrointestinal: Negative for nausea, vomiting and abdominal pain.  Genitourinary: Positive for dysuria, vaginal discharge and dyspareunia.  Skin: Positive for rash.    Allergies  Review of patient's allergies indicates no known allergies.  Home Medications   Prior to Admission medications   Medication Sig Start Date End Date Taking? Authorizing Provider  albuterol (PROVENTIL HFA;VENTOLIN HFA) 108 (90 BASE) MCG/ACT inhaler Inhale 2 puffs into the lungs every 6 (six) hours as needed. For shortness of breath     Historical Provider, MD  ALPRAZolam Duanne Moron) 0.5 MG tablet Take 0.5 mg by mouth 3 (three) times daily as needed for sleep or anxiety.    Historical Provider, MD  Aspirin-Salicylamide-Caffeine (BC HEADACHE POWDER PO) Take 1 packet by mouth daily as needed (for pain).    Historical Provider, MD  cycloSPORINE (RESTASIS) 0.05 % ophthalmic emulsion Place 1 drop into both eyes 2 (two) times daily.    Historical Provider, MD  fluocinolone (SYNALAR) 0.01 % external solution Apply topically 2 (two) times daily. Apply daily for  Days to scalp 07/25/13   Bjorn Pippin, PA-C  HYDROcodone-acetaminophen (NORCO/VICODIN) 5-325 MG per tablet Take 1 tablet by mouth every 4 (four) hours as needed. 12/04/12   Domenic Moras, PA-C  hydrOXYzine (ATARAX/VISTARIL) 25 MG tablet Take 1 tablet (25 mg total) by mouth every 6 (  six) hours. 07/25/13   Bjorn Pippin, PA-C  ibuprofen (ADVIL,MOTRIN) 200 MG tablet Take 800 mg by mouth daily as needed for pain.    Historical Provider, MD  ipratropium-albuterol (DUONEB) 0.5-2.5 (3) MG/3ML SOLN Take 3 mLs by nebulization every 6 (six) hours as needed (wheezing).    Historical Provider, MD  metoprolol (LOPRESSOR) 50 MG tablet Take 50 mg by mouth 2 (two) times daily.      Historical Provider, MD  PARoxetine (PAXIL) 20 MG tablet Take 40 mg by mouth every morning.      Historical Provider, MD   BP 150/84  Pulse 95  Temp(Src)  98.8 F (37.1 C) (Oral)  Resp 22  SpO2 98% Physical Exam  Nursing note and vitals reviewed. Constitutional: She is oriented to person, place, and time. She appears well-developed and well-nourished.  HENT:  Head: Normocephalic and atraumatic.  Multiple crusted papules and scaling with mild erythema on the scalp consistent with dermatitis.   Eyes: EOM are normal.  Neck: Normal range of motion. Neck supple.  Cardiovascular: Normal rate.   Pulmonary/Chest: Effort normal.  Genitourinary:  Pelvic exam: normal external genitalia, vulva, vagina, cervix, uterus and adnexa.   Musculoskeletal: Normal range of motion.  Neurological: She is alert and oriented to person, place, and time.  Skin: Skin is warm and dry.  Psychiatric: She has a normal mood and affect. Her behavior is normal.    ED Course  Procedures (including critical care time) DIAGNOSTIC STUDIES: Oxygen Saturation is 98% on room air, normal by my interpretation.    COORDINATION OF CARE: 5:18 PM-Discussed treatment plan which includes pelvic exam and steroids with pt at bedside and pt agreed to plan.   Labs Review Labs Reviewed  URINALYSIS, ROUTINE W REFLEX MICROSCOPIC - Abnormal; Notable for the following:    Hgb urine dipstick MODERATE (*)    Ketones, ur 15 (*)    Leukocytes, UA SMALL (*)    All other components within normal limits  URINE MICROSCOPIC-ADD ON - Abnormal; Notable for the following:    Squamous Epithelial / LPF MANY (*)    Bacteria, UA MANY (*)    All other components within normal limits  WET PREP, GENITAL  GC/CHLAMYDIA PROBE AMP   Imaging Review No results found.   EKG Interpretation None      MDM   Final diagnoses:  UTI (lower urinary tract infection)  Allergic dermatitis    Patient with UTI. No abnormality on wet prep and benign  Pelvic exam. Will treat for uncomplicated cystitis with keflex. Urine sent for culture. Allergic contact dermatitis of the scalp secondary to hair product.  Patient will be discharged with prednisone and benadryl No signs of infection. F/u with pcp.   I personally performed the services described in this documentation, which was scribed in my presence. The recorded information has been reviewed and is accurate.    Margarita Mail, PA-C 07/30/13 1843

## 2013-07-31 LAB — GC/CHLAMYDIA PROBE AMP
CT PROBE, AMP APTIMA: NEGATIVE
GC Probe RNA: NEGATIVE

## 2013-07-31 NOTE — ED Provider Notes (Signed)
Medical screening examination/treatment/procedure(s) were performed by non-physician practitioner and as supervising physician I was immediately available for consultation/collaboration.   EKG Interpretation None        Osvaldo Shipper, MD 07/31/13 [phone]

## 2013-08-03 ENCOUNTER — Inpatient Hospital Stay (HOSPITAL_COMMUNITY): Payer: Medicare Other

## 2013-08-03 ENCOUNTER — Emergency Department (HOSPITAL_COMMUNITY): Payer: Medicare Other

## 2013-08-03 ENCOUNTER — Inpatient Hospital Stay (HOSPITAL_COMMUNITY)
Admission: EM | Admit: 2013-08-03 | Discharge: 2013-08-07 | DRG: 193 | Disposition: A | Discharge status: Home / Self Care | Payer: Medicare Other | Attending: Cardiovascular Disease | Admitting: Cardiovascular Disease

## 2013-08-03 ENCOUNTER — Encounter (HOSPITAL_COMMUNITY): Payer: Self-pay | Admitting: Emergency Medicine

## 2013-08-03 DIAGNOSIS — I10 Essential (primary) hypertension: Secondary | ICD-10-CM | POA: Diagnosis present

## 2013-08-03 DIAGNOSIS — F17200 Nicotine dependence, unspecified, uncomplicated: Secondary | ICD-10-CM | POA: Diagnosis present

## 2013-08-03 DIAGNOSIS — J441 Chronic obstructive pulmonary disease with (acute) exacerbation: Secondary | ICD-10-CM | POA: Diagnosis present

## 2013-08-03 DIAGNOSIS — J42 Unspecified chronic bronchitis: Secondary | ICD-10-CM

## 2013-08-03 DIAGNOSIS — M129 Arthropathy, unspecified: Secondary | ICD-10-CM | POA: Diagnosis present

## 2013-08-03 DIAGNOSIS — J209 Acute bronchitis, unspecified: Secondary | ICD-10-CM

## 2013-08-03 DIAGNOSIS — J9600 Acute respiratory failure, unspecified whether with hypoxia or hypercapnia: Secondary | ICD-10-CM | POA: Diagnosis present

## 2013-08-03 DIAGNOSIS — E119 Type 2 diabetes mellitus without complications: Secondary | ICD-10-CM | POA: Diagnosis present

## 2013-08-03 DIAGNOSIS — J189 Pneumonia, unspecified organism: Secondary | ICD-10-CM | POA: Diagnosis present

## 2013-08-03 DIAGNOSIS — F411 Generalized anxiety disorder: Secondary | ICD-10-CM | POA: Diagnosis present

## 2013-08-03 DIAGNOSIS — Z23 Encounter for immunization: Secondary | ICD-10-CM | POA: Diagnosis not present

## 2013-08-03 DIAGNOSIS — E876 Hypokalemia: Secondary | ICD-10-CM | POA: Diagnosis present

## 2013-08-03 DIAGNOSIS — R0602 Shortness of breath: Secondary | ICD-10-CM | POA: Diagnosis present

## 2013-08-03 DIAGNOSIS — R062 Wheezing: Secondary | ICD-10-CM | POA: Diagnosis not present

## 2013-08-03 LAB — CBC WITH DIFFERENTIAL/PLATELET
Basophils Absolute: 0 10*3/uL (ref 0.0–0.1)
Basophils Relative: 0 % (ref 0–1)
EOS ABS: 0.8 10*3/uL — AB (ref 0.0–0.7)
Eosinophils Relative: 6 % — ABNORMAL HIGH (ref 0–5)
HEMATOCRIT: 43.6 % (ref 36.0–46.0)
HEMOGLOBIN: 14.7 g/dL (ref 12.0–15.0)
LYMPHS ABS: 2.4 10*3/uL (ref 0.7–4.0)
Lymphocytes Relative: 18 % (ref 12–46)
MCH: 32.8 pg (ref 26.0–34.0)
MCHC: 33.7 g/dL (ref 30.0–36.0)
MCV: 97.3 fL (ref 78.0–100.0)
MONO ABS: 0.9 10*3/uL (ref 0.1–1.0)
MONOS PCT: 7 % (ref 3–12)
Neutro Abs: 9.3 10*3/uL — ABNORMAL HIGH (ref 1.7–7.7)
Neutrophils Relative %: 69 % (ref 43–77)
Platelets: 232 10*3/uL (ref 150–400)
RBC: 4.48 MIL/uL (ref 3.87–5.11)
RDW: 12.9 % (ref 11.5–15.5)
WBC: 13.4 10*3/uL — ABNORMAL HIGH (ref 4.0–10.5)

## 2013-08-03 LAB — BASIC METABOLIC PANEL
Anion gap: 15 (ref 5–15)
BUN: 12 mg/dL (ref 6–23)
CO2: 27 mEq/L (ref 19–32)
CREATININE: 0.86 mg/dL (ref 0.50–1.10)
Calcium: 9.2 mg/dL (ref 8.4–10.5)
Chloride: 99 mEq/L (ref 96–112)
GFR calc Af Amer: 85 mL/min — ABNORMAL LOW (ref >90)
GFR, EST NON AFRICAN AMERICAN: 73 mL/min — AB (ref >90)
GLUCOSE: 129 mg/dL — AB (ref 70–99)
POTASSIUM: 3.4 meq/L — AB (ref 3.7–5.3)
Sodium: 141 mEq/L (ref 137–147)

## 2013-08-03 LAB — D-DIMER, QUANTITATIVE: D-Dimer, Quant: 0.29 ug/mL-FEU (ref 0.00–0.48)

## 2013-08-03 LAB — PRO B NATRIURETIC PEPTIDE: Pro B Natriuretic peptide (BNP): 386.7 pg/mL — ABNORMAL HIGH (ref 0–125)

## 2013-08-03 LAB — I-STAT TROPONIN, ED: TROPONIN I, POC: 0.01 ng/mL (ref 0.00–0.08)

## 2013-08-03 MED ORDER — SODIUM CHLORIDE 0.9 % IJ SOLN
3.0000 mL | Freq: Two times a day (BID) | INTRAMUSCULAR | Status: DC
  Administered 2013-08-04 – 2013-08-05 (×3): 3 mL via INTRAVENOUS

## 2013-08-03 MED ORDER — SODIUM CHLORIDE 0.9 % IJ SOLN: 3.0000 mL | Freq: Two times a day (BID) | INTRAMUSCULAR | Status: DC

## 2013-08-03 MED ORDER — DOCUSATE SODIUM 100 MG PO CAPS
100.0000 mg | ORAL_CAPSULE | Freq: Two times a day (BID) | ORAL | Status: DC
  Administered 2013-08-04 – 2013-08-05 (×3): 100 mg via ORAL
  Filled 2013-08-03 (×4): qty 1

## 2013-08-03 MED ORDER — ALPRAZOLAM 0.5 MG PO TABS
0.5000 mg | ORAL_TABLET | Freq: Three times a day (TID) | ORAL | Status: DC | PRN
  Administered 2013-08-04 – 2013-08-06 (×4): 0.5 mg via ORAL
  Filled 2013-08-03 (×4): qty 1

## 2013-08-03 MED ORDER — ADULT MULTIVITAMIN W/MINERALS CH
1.0000 | ORAL_TABLET | Freq: Every day | ORAL | Status: DC
  Administered 2013-08-04 – 2013-08-05 (×2): 1 via ORAL
  Filled 2013-08-03 (×2): qty 1

## 2013-08-03 MED ORDER — HYDROCODONE-ACETAMINOPHEN 5-325 MG PO TABS
1.0000 | ORAL_TABLET | Freq: Four times a day (QID) | ORAL | Status: DC | PRN
  Administered 2013-08-05 – 2013-08-06 (×3): 1 via ORAL
  Filled 2013-08-03 (×3): qty 1

## 2013-08-03 MED ORDER — SODIUM CHLORIDE 0.9 % IV SOLN
INTRAVENOUS | Status: DC
  Administered 2013-08-03: via INTRAVENOUS

## 2013-08-03 MED ORDER — HYDROXYZINE HCL 25 MG PO TABS
25.0000 mg | ORAL_TABLET | Freq: Four times a day (QID) | ORAL | Status: DC
  Administered 2013-08-03 – 2013-08-07 (×14): 25 mg via ORAL
  Filled 2013-08-03 (×19): qty 1

## 2013-08-03 MED ORDER — SODIUM CHLORIDE 0.9 % IV SOLN: 250.0000 mL | INTRAVENOUS | Status: DC | PRN

## 2013-08-03 MED ORDER — ALBUTEROL SULFATE (2.5 MG/3ML) 0.083% IN NEBU
2.5000 mg | INHALATION_SOLUTION | Freq: Four times a day (QID) | RESPIRATORY_TRACT | Status: DC
  Administered 2013-08-04 – 2013-08-05 (×6): 2.5 mg via RESPIRATORY_TRACT
  Filled 2013-08-03 (×6): qty 3

## 2013-08-03 MED ORDER — PNEUMOCOCCAL VAC POLYVALENT 25 MCG/0.5ML IJ INJ
0.5000 mL | INJECTION | INTRAMUSCULAR | Status: AC
  Administered 2013-08-04: 0.5 mL via INTRAMUSCULAR
  Filled 2013-08-03 (×2): qty 0.5

## 2013-08-03 MED ORDER — GUAIFENESIN-DM 100-10 MG/5ML PO SYRP
5.0000 mL | ORAL_SOLUTION | ORAL | Status: DC | PRN
  Administered 2013-08-04 – 2013-08-06 (×6): 5 mL via ORAL
  Filled 2013-08-03 (×6): qty 10

## 2013-08-03 MED ORDER — SODIUM CHLORIDE 0.9 % IJ SOLN: 3.0000 mL | INTRAMUSCULAR | Status: DC | PRN

## 2013-08-03 MED ORDER — IOHEXOL 350 MG/ML SOLN
100.0000 mL | Freq: Once | INTRAVENOUS | Status: AC | PRN
  Administered 2013-08-03: 100 mL via INTRAVENOUS

## 2013-08-03 MED ORDER — ACETAMINOPHEN 325 MG PO TABS: 650.0000 mg | ORAL_TABLET | Freq: Four times a day (QID) | ORAL | Status: DC | PRN

## 2013-08-03 MED ORDER — ONDANSETRON HCL 4 MG/2ML IJ SOLN: 4.0000 mg | Freq: Four times a day (QID) | INTRAMUSCULAR | Status: DC | PRN

## 2013-08-03 MED ORDER — PREDNISONE 20 MG PO TABS
40.0000 mg | ORAL_TABLET | Freq: Every day | ORAL | Status: DC
  Administered 2013-08-04 – 2013-08-05 (×2): 40 mg via ORAL
  Filled 2013-08-03 (×2): qty 2

## 2013-08-03 MED ORDER — ONDANSETRON HCL 4 MG PO TABS: 4.0000 mg | ORAL_TABLET | Freq: Four times a day (QID) | ORAL | Status: DC | PRN

## 2013-08-03 MED ORDER — METOPROLOL TARTRATE 50 MG PO TABS
50.0000 mg | ORAL_TABLET | Freq: Two times a day (BID) | ORAL | Status: DC
  Administered 2013-08-03 – 2013-08-05 (×5): 50 mg via ORAL
  Filled 2013-08-03 (×3): qty 1
  Filled 2013-08-03: qty 2
  Filled 2013-08-03 (×3): qty 1

## 2013-08-03 MED ORDER — DEXTROSE 5 % IV SOLN
1.0000 g | INTRAVENOUS | Status: DC
  Administered 2013-08-03 – 2013-08-06 (×4): 1 g via INTRAVENOUS
  Filled 2013-08-03 (×4): qty 10

## 2013-08-03 MED ORDER — POTASSIUM CHLORIDE CRYS ER 20 MEQ PO TBCR
20.0000 meq | EXTENDED_RELEASE_TABLET | Freq: Two times a day (BID) | ORAL | Status: DC
  Administered 2013-08-03 – 2013-08-06 (×6): 20 meq via ORAL
  Filled 2013-08-03 (×7): qty 1

## 2013-08-03 MED ORDER — ACETAMINOPHEN 650 MG RE SUPP: 650.0000 mg | Freq: Four times a day (QID) | RECTAL | Status: DC | PRN

## 2013-08-03 MED ORDER — ALBUTEROL (5 MG/ML) CONTINUOUS INHALATION SOLN
10.0000 mg/h | INHALATION_SOLUTION | RESPIRATORY_TRACT | Status: AC
  Administered 2013-08-03: 10 mg/h via RESPIRATORY_TRACT
  Filled 2013-08-03: qty 20

## 2013-08-03 MED ORDER — PAROXETINE HCL 20 MG PO TABS
40.0000 mg | ORAL_TABLET | Freq: Every day | ORAL | Status: DC
Start: 2013-08-04 — End: 2013-08-07
  Administered 2013-08-04 – 2013-08-07 (×4): 40 mg via ORAL
  Filled 2013-08-03 (×4): qty 2

## 2013-08-03 NOTE — H&P (Signed)
Referring Physician: [name], MD.  Deborah Mcintyre is an 58 y.o. female.                       Chief Complaint: Cough and shortness of breath  HPI: 58 year old female with past medical history significant for hypertension, recurrent bronchitis, COPD, has 2-3 day history of shortness of breath without fever or chest pain but with cough and wheezing. Current smoker.  Past Medical History  Diagnosis Date  . Hypertension   . Pneumonia   . Bronchitis   . Arthritis   . COPD (chronic obstructive pulmonary disease)       Past Surgical History  Procedure Laterality Date  . Cesarean section      No family history on file. Social History:  reports that she has been smoking.  She does not have any smokeless tobacco history on file. She reports that she does not drink alcohol or use illicit drugs.  Allergies: No Known Allergies   (Not in a hospital admission)  Results for orders placed during the hospital encounter of 08/03/13 (from the past 48 hour(s))  CBC WITH DIFFERENTIAL     Status: Abnormal   Collection Time    08/03/13  7:47 PM      Result Value Ref Range   WBC 13.4 (*) 4.0 - 10.5 K/uL   RBC 4.48  3.87 - 5.11 MIL/uL   Hemoglobin 14.7  12.0 - 15.0 g/dL   HCT 43.6  36.0 - 46.0 %   MCV 97.3  78.0 - 100.0 fL   MCH 32.8  26.0 - 34.0 pg   MCHC 33.7  30.0 - 36.0 g/dL   RDW 12.9  11.5 - 15.5 %   Platelets 232  150 - 400 K/uL   Neutrophils Relative % 69  43 - 77 %   Neutro Abs 9.3 (*) 1.7 - 7.7 K/uL   Lymphocytes Relative 18  12 - 46 %   Lymphs Abs 2.4  0.7 - 4.0 K/uL   Monocytes Relative 7  3 - 12 %   Monocytes Absolute 0.9  0.1 - 1.0 K/uL   Eosinophils Relative 6 (*) 0 - 5 %   Eosinophils Absolute 0.8 (*) 0.0 - 0.7 K/uL   Basophils Relative 0  0 - 1 %   Basophils Absolute 0.0  0.0 - 0.1 K/uL  BASIC METABOLIC PANEL     Status: Abnormal   Collection Time    08/03/13  7:47 PM      Result Value Ref Range   Sodium 141  137 - 147 mEq/L   Potassium 3.4 (*) 3.7 - 5.3  mEq/L   Chloride 99  96 - 112 mEq/L   CO2 27  19 - 32 mEq/L   Glucose, Bld 129 (*) 70 - 99 mg/dL   BUN 12  6 - 23 mg/dL   Creatinine, Ser 0.86  0.50 - 1.10 mg/dL   Calcium 9.2  8.4 - 10.5 mg/dL   GFR calc non Af Amer 73 (*) >90 mL/min   GFR calc Af Amer 85 (*) >90 mL/min   Comment: (NOTE)     The eGFR has been calculated using the CKD EPI equation.     This calculation has not been validated in all clinical situations.     eGFR's persistently <90 mL/min signify possible Chronic Kidney     Disease.   Anion gap 15  5 - 15  PRO B NATRIURETIC PEPTIDE  Status: Abnormal   Collection Time    08/03/13  7:47 PM      Result Value Ref Range   Pro B Natriuretic peptide (BNP) 386.7 (*) 0 - 125 pg/mL  I-STAT TROPOININ, ED     Status: None   Collection Time    08/03/13  7:55 PM      Result Value Ref Range   Troponin i, poc 0.01  0.00 - 0.08 ng/mL   Comment 3            Comment: Due to the release kinetics of cTnI,     a negative result within the first hours     of the onset of symptoms does not rule out     myocardial infarction with certainty.     If myocardial infarction is still suspected,     repeat the test at appropriate intervals.   Dg Chest 2 View  08/03/2013   CLINICAL DATA:  Shortness of Breath  EXAM: CHEST  2 VIEW  COMPARISON:  March 05, 2012  FINDINGS: There is no edema or consolidation. The heart size and pulmonary vascularity are normal. No adenopathy. No bone lesions.  IMPRESSION: No edema or consolidation.   Electronically Signed   By: Lowella Grip M.D.   On: 08/03/2013 20:14    Review of Systems  Constitutional: Negative for fever and chills.  HENT: Negative for hearing loss and tinnitus.  Eyes: Negative for double vision and photophobia.  Respiratory: Positive for cough and shortness of breath. Negative for hemoptysis and sputum production.  Cardiovascular:Negative for chest pain and palpitations or leg edema.  Gastrointestinal: Positive for nausea and abdominal  pain. Negative for vomiting.  Genitourinary: Negative for dysuria.  Neurological: Negative for dizziness.    Blood pressure 152/85, pulse 113, temperature 98 F (36.7 C), temperature source Oral, resp. rate 20, SpO2 93.00%. Physical Exam  Nursing note and vitals reviewed.  Constitutional: Well-developed and well-nourished. Mild respiratory distress.  HENT: Normocephalic and atraumatic, Right Ear: Tympanic membrane and ear canal normal. Left Ear: Tympanic membrane and ear canal normal.  Nose: Nose normal.  Mouth/Throat: Uvula is midline, oropharynx is clear and moist and mucous membranes are normal. No oropharyngeal exudate, posterior oropharyngeal edema. Positive posterior oropharyngeal erythema. No tonsillar abscesses.  Eyes: Conjunctivae and EOM are normal. Pupils are equal, round, and reactive to light.  Neck: Normal range of motion. Neck supple.  Cardiovascular: Regular rhythm and normal heart sounds. Tachycardia present.  Pulmonary/Chest:  Mild respiratory distress. Respirations mildly labored, coarse breath sounds bilaterally with wheezes intermixed; speaking in full complete sentences without difficulty  Abdominal: Soft. Bowel sounds are normal. There is no tenderness. There is no guarding.  Musculoskeletal: Normal range of motion.  Neurological: She is alert and oriented to person, place, and time. Moves all 4 extremities. Skin: Skin is warm and dry. She is not diaphoretic.  Psychiatric: She has a normal mood and affect.   Assessment/Plan Acute respiratory failure r/o PE Acute exacerbation of chronic bronchitis Hypertension Anxiety Tobacco use disorder  Admit/Oxygen, Home medications CT chest Lower extremity venous duplex.  Birdie Riddle, MD  08/03/2013, 10:35 PM

## 2013-08-03 NOTE — ED Notes (Addendum)
Per EMS: Pt called EMS due to shortness of breath that began yesterday. EMS reports expiratory wheezing. Pt was recently diagnosed with COPD and is still smoking cigarettes. Pt reports having bronchitis annually. EMS administered 125 mg of solumedrol and gave a duoneb treatment (twice) in route.

## 2013-08-03 NOTE — ED Provider Notes (Signed)
CSN: [id number]     Arrival date & time 08/03/13  1920 History   First MD Initiated Contact with Patient 08/03/13 1921     Chief Complaint  Patient presents with  . Shortness of Breath     (Consider location/radiation/quality/duration/timing/severity/associated sxs/prior Treatment) Patient is a 58 y.o. female presenting with shortness of breath. The history is provided by the patient and medical records.  Shortness of Breath  This is a 58 year old female with past medical history significant for hypertension, recurrent bronchitis, COPD, presented to the ED shortness of breath. Patient states began yesterday afternoon has been progressively worsening.  She denies any chest pain, palpitations, dizziness, weakness, or feelings of syncope. Patient continues smoking daily. Notes slight non-productive cough and rhinorrhea recently. No fever or chills. No known sick contacts. She is not on home oxygen therapy. She has no prior cardiac history. Patient with initial O2 sat of 85% on EMS arrival.  She was given duoneb x2 and 125mg  solu-medrol en route to ED.  Past Medical History  Diagnosis Date  . Hypertension   . Pneumonia   . Bronchitis   . Arthritis   . COPD (chronic obstructive pulmonary disease)    Past Surgical History  Procedure Laterality Date  . Cesarean section     No family history on file. History  Substance Use Topics  . Smoking status: Current Every Day Smoker -- 0.50 packs/day  . Smokeless tobacco: Not on file  . Alcohol Use: No   OB History   Grav Para Term Preterm Abortions TAB SAB Ect Mult Living                 Review of Systems  Respiratory: Positive for shortness of breath.   All other systems reviewed and are negative.     Allergies  Review of patient's allergies indicates no known allergies.  Home Medications   Prior to Admission medications   Medication Sig Start Date End Date Taking? Authorizing Provider  albuterol (PROVENTIL HFA;VENTOLIN HFA) 108  (90 BASE) MCG/ACT inhaler Inhale 2 puffs into the lungs every 6 (six) hours as needed. For shortness of breath    Yes Historical Provider, MD  ALPRAZolam (XANAX) 0.5 MG tablet Take 0.5 mg by mouth 3 (three) times daily as needed for sleep or anxiety.   Yes Historical Provider, MD  Aspirin-Salicylamide-Caffeine (BC HEADACHE POWDER PO) Take 1 packet by mouth daily as needed (for pain).   Yes Historical Provider, MD  cephALEXin (KEFLEX) 500 MG capsule Take 1 capsule (500 mg total) by mouth 3 (three) times daily. 07/30/13  Yes Margarita Mail, PA-C  HYDROcodone-acetaminophen (NORCO/VICODIN) 5-325 MG per tablet Take 1 tablet by mouth every 4 (four) hours as needed. 12/04/12  Yes Domenic Moras, PA-C  hydrOXYzine (ATARAX/VISTARIL) 25 MG tablet Take 1 tablet (25 mg total) by mouth every 6 (six) hours. 07/25/13  Yes Bjorn Pippin, PA-C  ibuprofen (ADVIL,MOTRIN) 200 MG tablet Take 800 mg by mouth daily as needed for pain.   Yes Historical Provider, MD  ipratropium-albuterol (DUONEB) 0.5-2.5 (3) MG/3ML SOLN Take 3 mLs by nebulization every 6 (six) hours as needed (wheezing).   Yes Historical Provider, MD  metoprolol (LOPRESSOR) 50 MG tablet Take 50 mg by mouth 2 (two) times daily.     Yes Historical Provider, MD  PARoxetine (PAXIL) 20 MG tablet Take 40 mg by mouth every morning.     Yes Historical Provider, MD  phenazopyridine (PYRIDIUM) 200 MG tablet Take 1 tablet (200 mg total) by mouth 3 (  three) times daily. 07/30/13  Yes Margarita Mail, PA-C  predniSONE (DELTASONE) 20 MG tablet Take 2 tablets (40 mg total) by mouth daily. 07/30/13  Yes Abigail Harris, PA-C   BP 130/85  Pulse 128  Temp(Src) 98.8 F (37.1 C) (Oral)  Resp 28  SpO2 93%  Physical Exam  Nursing note and vitals reviewed. Constitutional: She is oriented to person, place, and time. She appears well-developed and well-nourished. No distress.  HENT:  Head: Normocephalic and atraumatic.  Right Ear: Tympanic membrane and ear canal normal.  Left  Ear: Tympanic membrane and ear canal normal.  Nose: Nose normal.  Mouth/Throat: Uvula is midline, oropharynx is clear and moist and mucous membranes are normal. No oropharyngeal exudate, posterior oropharyngeal edema, posterior oropharyngeal erythema or tonsillar abscesses.  Eyes: Conjunctivae and EOM are normal. Pupils are equal, round, and reactive to light.  Neck: Normal range of motion. Neck supple.  Cardiovascular: Regular rhythm and normal heart sounds.  Tachycardia present.   Pulmonary/Chest: Effort normal. No respiratory distress. She has wheezes.  Respirations unlabored, coarse breath sounds bilaterally with wheezes intermixed; speaking in full complete sentences without difficulty  Abdominal: Soft. Bowel sounds are normal. There is no tenderness. There is no guarding.  Musculoskeletal: Normal range of motion.  Neurological: She is alert and oriented to person, place, and time.  Skin: Skin is warm and dry. She is not diaphoretic.  Psychiatric: She has a normal mood and affect.    ED Course  Procedures (including critical care time) Labs Review Labs Reviewed  CBC WITH DIFFERENTIAL - Abnormal; Notable for the following:    WBC 13.4 (*)    Neutro Abs 9.3 (*)    Eosinophils Relative 6 (*)    Eosinophils Absolute 0.8 (*)    All other components within normal limits  BASIC METABOLIC PANEL  PRO B NATRIURETIC PEPTIDE  I-STAT TROPOININ, ED    Imaging Review Dg Chest 2 View  08/03/2013   CLINICAL DATA:  Shortness of Breath  EXAM: CHEST  2 VIEW  COMPARISON:  March 05, 2012  FINDINGS: There is no edema or consolidation. The heart size and pulmonary vascularity are normal. No adenopathy. No bone lesions.  IMPRESSION: No edema or consolidation.   Electronically Signed   By: Lowella Grip M.D.   On: 08/03/2013 20:14     EKG Interpretation   Date/Time:  Thursday August 03 2013 19:29:45 EDT Ventricular Rate:  129 PR Interval:  104 QRS Duration: 133 QT Interval:  454 QTC  Calculation: 665 R Axis:   -78 Text Interpretation:  Sinus tachycardia RBBB and LAFB Compared to previous  tracing Right bundle branch block Left axis deviation NOW PRESENT Rate  faster Confirmed by [facility]  MD, Jenny Reichmann ([id number]) on 08/03/2013 7:42:40 PM      MDM   Final diagnoses:  COPD exacerbation   58 year old female presenting to the ED for shortness of breath without chest pain. Initial O2 sats 85% on room air, improved to 93% with supplemental oxygen. On exam, she is slightly tachypneic but no respiratory distress noted. She has coarse breath sounds bilaterally with intermixed expiratory wheezes. She is afebrile but tachycardic rate of 120's, likely secondary to albuterol treatments in route with EMS. Will obtain basic labs, EKG, troponin, chest x-ray. Patient started on hour long neb treatment.  EKG sinus tachycardia. Troponin is negative. Chest x-ray is clear. Lab work is reassuring.  After neb, pts breathing has slowed and mostly clear-- continue to have faint expiratory wheezes at bases bilaterally.  HR has slowed, now in 110's.  Pt was removed from supplemental O2 for <1 minute while lying in bed with de-sat to 88%, returned to normal when O2 reapplied.  Pt will require hospital admission.  Case discussed with pts PCP, Dr. Jeralyn Bennett, who will admit for further management.  Larene Pickett, PA-C 08/04/13 0009

## 2013-08-03 NOTE — ED Provider Notes (Signed)
Medical screening examination/treatment/procedure(s) were conducted as a shared visit with non-physician practitioner(s) and myself.  I personally evaluated the patient during the encounter.   EKG Interpretation   Date/Time:  Thursday August 03 2013 19:29:45 EDT Ventricular Rate:  129 PR Interval:  104 QRS Duration: 133 QT Interval:  454 QTC Calculation: 665 R Axis:   -78 Text Interpretation:  Sinus tachycardia RBBB and LAFB Compared to previous  tracing Right bundle branch block Left axis deviation NOW PRESENT Rate  faster Confirmed by [facility]  MD, Jenny Reichmann ([id number]) on 08/03/2013 7:42:40 PM     Today's gradual onset cough shortness breath wheezing gradually worsening constant for the last 2 days no chest pain no fever no confusion examination diffuse wheezing with mild breast distress however patient has significant hypoxemia with room air pulse oximetry 83% prior to arrival her pulse oximetry improved to 91% on 2.5 liters nasal cannula oxygen in the ED.  Babette Relic, MD 08/04/13 2122

## 2013-08-04 DIAGNOSIS — R062 Wheezing: Secondary | ICD-10-CM

## 2013-08-04 DIAGNOSIS — J189 Pneumonia, unspecified organism: Secondary | ICD-10-CM | POA: Diagnosis not present

## 2013-08-04 DIAGNOSIS — R0602 Shortness of breath: Secondary | ICD-10-CM

## 2013-08-04 LAB — CBC
HCT: 41.8 % (ref 36.0–46.0)
HEMOGLOBIN: 13.8 g/dL (ref 12.0–15.0)
MCH: 32.5 pg (ref 26.0–34.0)
MCHC: 33 g/dL (ref 30.0–36.0)
MCV: 98.4 fL (ref 78.0–100.0)
Platelets: 244 10*3/uL (ref 150–400)
RBC: 4.25 MIL/uL (ref 3.87–5.11)
RDW: 13.2 % (ref 11.5–15.5)
WBC: 12.2 10*3/uL — ABNORMAL HIGH (ref 4.0–10.5)

## 2013-08-04 LAB — BASIC METABOLIC PANEL
Anion gap: 14 (ref 5–15)
BUN: 16 mg/dL (ref 6–23)
CHLORIDE: 103 meq/L (ref 96–112)
CO2: 25 meq/L (ref 19–32)
CREATININE: 0.81 mg/dL (ref 0.50–1.10)
Calcium: 9.3 mg/dL (ref 8.4–10.5)
GFR calc Af Amer: >90 mL/min (ref >90)
GFR calc non Af Amer: 79 mL/min — ABNORMAL LOW (ref >90)
Glucose, Bld: 159 mg/dL — ABNORMAL HIGH (ref 70–99)
POTASSIUM: 4 meq/L (ref 3.7–5.3)
Sodium: 142 mEq/L (ref 137–147)

## 2013-08-04 LAB — GLUCOSE, CAPILLARY
GLUCOSE-CAPILLARY: 151 mg/dL — AB (ref 70–99)
Glucose-Capillary: 159 mg/dL — ABNORMAL HIGH (ref 70–99)
Glucose-Capillary: 131 mg/dL — ABNORMAL HIGH (ref 70–99)
Glucose-Capillary: 143 mg/dL — ABNORMAL HIGH (ref 70–99)

## 2013-08-04 LAB — HEMOGLOBIN A1C
HEMOGLOBIN A1C: 6.1 % — AB (ref <5.7)
Mean Plasma Glucose: 128 mg/dL — ABNORMAL HIGH (ref <117)

## 2013-08-04 MED ORDER — GLIPIZIDE ER 2.5 MG PO TB24
2.5000 mg | ORAL_TABLET | Freq: Every day | ORAL | Status: DC
  Administered 2013-08-04 – 2013-08-07 (×4): 2.5 mg via ORAL
  Filled 2013-08-04 (×6): qty 1

## 2013-08-04 MED ORDER — DEXTROSE 5 % IV SOLN
500.0000 mg | INTRAVENOUS | Status: DC
  Administered 2013-08-04 – 2013-08-05 (×2): 500 mg via INTRAVENOUS
  Filled 2013-08-04 (×2): qty 500

## 2013-08-04 MED ORDER — METFORMIN HCL 500 MG PO TABS: 250.0000 mg | ORAL_TABLET | Freq: Two times a day (BID) | ORAL | Status: DC

## 2013-08-04 MED ORDER — HEPARIN SODIUM (PORCINE) 5000 UNIT/ML IJ SOLN
5000.0000 [IU] | Freq: Three times a day (TID) | INTRAMUSCULAR | Status: DC
  Administered 2013-08-04 – 2013-08-07 (×9): 5000 [IU] via SUBCUTANEOUS
  Filled 2013-08-04 (×13): qty 1

## 2013-08-04 MED ORDER — INSULIN ASPART 100 UNIT/ML ~~LOC~~ SOLN
0.0000 [IU] | Freq: Three times a day (TID) | SUBCUTANEOUS | Status: DC
  Administered 2013-08-04: 2 [IU] via SUBCUTANEOUS
  Administered 2013-08-04: 3 [IU] via SUBCUTANEOUS
  Administered 2013-08-06: 2 [IU] via SUBCUTANEOUS

## 2013-08-04 NOTE — Progress Notes (Signed)
Agree with the previous RN's assessment. Will continue to monitor patient.Deborah Mcintyre, Deborah Mcintyre

## 2013-08-04 NOTE — Care Management Note (Addendum)
    Page 1 of 1   08/07/2013     3:10:56 PM CARE MANAGEMENT NOTE 08/07/2013  Patient:  Deborah Mcintyre, Deborah Mcintyre   Account Number:  [id number]  Date Initiated:  08/04/2013  Documentation initiated by:  Dessa Phi  Subjective/Objective Assessment:   58 Y/O F ADMITTED W/SOB.     Action/Plan:   FROM HOME.   Anticipated DC Date:  08/07/2013   Anticipated DC Plan:  [place]  CM consult      Choice offered to / List presented to:             Status of service:  Completed, signed off Medicare Important Message given?  NA - LOS <3 / Initial given by admissions (If response is "NO", the following Medicare IM given date fields will be blank) Date Medicare IM given:  08/07/2013 Medicare IM given by:  [facility] Date Additional Medicare IM given:   Additional Medicare IM given by:    Discharge Disposition:  HOME/SELF CARE  Per UR Regulation:  Reviewed for med. necessity/level of care/duration of stay  If discussed at [place] of Stay Meetings, dates discussed:    Comments:  08/07/13 Kataleia Quaranta RN,BSN NCM [phone] D/C HOME NO [place].  08/04/13 Serenidy Waltz RN,BSN NCM [phone] NO ANTICIPATED D/C NEEDS.

## 2013-08-04 NOTE — Progress Notes (Signed)
Ref: Huck Ashworth S, MD   Subjective:  Feeling 50 % better. No PE per scan but atypical pneumonia/Bronchitis.  Objective:  Vital Signs in the last 24 hours: Temp:  [97.7 F (36.5 C)-98.8 F (37.1 C)] 97.7 F (36.5 C) (07/31 0520) Pulse Rate:  [79-128] 79 (07/31 0520) Cardiac Rhythm:  [-] Normal sinus rhythm (07/30 2343) Resp:  [20-32] 20 (07/31 0520) BP: (124-152)/(68-85) 129/83 mmHg (07/31 0520) SpO2:  [83 %-98 %] 97 % (07/31 0520) Weight:  [94.666 kg (208 lb 11.2 oz)] 94.666 kg (208 lb 11.2 oz) (07/30 2352)  Physical Exam: BP Readings from Last 1 Encounters:  08/04/13 129/83    Wt Readings from Last 1 Encounters:  08/03/13 94.666 kg (208 lb 11.2 oz)    Weight change:   HEENT: [place]/AT, Eyes-Brown, PERL, EOMI, Conjunctiva-Pink, Sclera-Non-icteric Neck: No JVD, No bruit, Trachea midline. Lungs:  Clearing with decreased wheezing, Bilateral. Cardiac:  Regular rhythm, normal S1 and S2, no S3.  Abdomen:  Soft, non-tender. Extremities:  No edema present. No cyanosis. No clubbing. CNS: AxOx3, Cranial nerves grossly intact, moves all 4 extremities. Right handed. Skin: Warm and dry.   Intake/Output from previous day: 07/30 0701 - 07/31 0700 In: 222 [I.V.:222] Out: -     Lab Results: BMET    Component Value Date/Time   NA 142 08/04/2013 0414   NA 141 08/03/2013 1947   NA 140 03/05/2012 1820   K 4.0 08/04/2013 0414   K 3.4* 08/03/2013 1947   K 3.6 03/05/2012 1820   CL 103 08/04/2013 0414   CL 99 08/03/2013 1947   CL 102 03/05/2012 1820   CO2 25 08/04/2013 0414   CO2 27 08/03/2013 1947   CO2 30 03/05/2012 1820   GLUCOSE 159* 08/04/2013 0414   GLUCOSE 129* 08/03/2013 1947   GLUCOSE 95 03/05/2012 1820   BUN 16 08/04/2013 0414   BUN 12 08/03/2013 1947   BUN 10 03/05/2012 1820   CREATININE 0.81 08/04/2013 0414   CREATININE 0.86 08/03/2013 1947   CREATININE 0.64 03/05/2012 1820   CALCIUM 9.3 08/04/2013 0414   CALCIUM 9.2 08/03/2013 1947   CALCIUM 9.2 03/05/2012 1820   GFRNONAA 79* 08/04/2013 0414    GFRNONAA 73* 08/03/2013 1947   GFRNONAA >90 03/05/2012 1820   GFRAA >90 08/04/2013 0414   GFRAA 85* 08/03/2013 1947   GFRAA >90 03/05/2012 1820   CBC    Component Value Date/Time   WBC 12.2* 08/04/2013 0414   RBC 4.25 08/04/2013 0414   HGB 13.8 08/04/2013 0414   HCT 41.8 08/04/2013 0414   PLT 244 08/04/2013 0414   MCV 98.4 08/04/2013 0414   MCH 32.5 08/04/2013 0414   MCHC 33.0 08/04/2013 0414   RDW 13.2 08/04/2013 0414   LYMPHSABS 2.4 08/03/2013 1947   MONOABS 0.9 08/03/2013 1947   EOSABS 0.8* 08/03/2013 1947   BASOSABS 0.0 08/03/2013 1947   HEPATIC Function Panel No results found for this basename: PROT,  ALBUMIN,  AST,  ALT,  ALKPHOS,  BILIDIR,  IBILI,  in the last 8760 hours HEMOGLOBIN A1C No components found with this basename: HGA1C,  MPG   CARDIAC ENZYMES No results found for this basename: CKTOTAL, CKMB, CKMBINDEX, TROPONINI   BNP  Recent Labs  08/03/13 1947  PROBNP 386.7*   TSH No results found for this basename: TSH,  in the last 8760 hours CHOLESTEROL No results found for this basename: CHOL,  in the last 8760 hours  Scheduled Meds: . albuterol  2.5 mg Nebulization Q6H  .  azithromycin  500 mg Intravenous Q24H  . cefTRIAXone (ROCEPHIN)  IV  1 g Intravenous Q24H  . docusate sodium  100 mg Oral BID  . heparin  5,000 Units Subcutaneous 3 times per day  . hydrOXYzine  25 mg Oral Q6H  . metoprolol  50 mg Oral BID  . multivitamin with minerals  1 tablet Oral Daily  . PARoxetine  40 mg Oral Daily  . pneumococcal 23 valent vaccine  0.5 mL Intramuscular Tomorrow-1000  . potassium chloride  20 mEq Oral BID  . predniSONE  40 mg Oral Daily  . sodium chloride  3 mL Intravenous Q12H  . sodium chloride  3 mL Intravenous Q12H   Continuous Infusions: . 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 Add metformin, small dose.     LOS: 1 day    [name]  MD  08/04/2013, 8:20 AM

## 2013-08-04 NOTE — Progress Notes (Signed)
*  PRELIMINARY RESULTS* Vascular Ultrasound Lower extremity venous duplex has been completed.  Preliminary findings: No evidence of DVT  Landry Mellow, RDMS, RVT  08/04/2013, 9:16 AM

## 2013-08-05 LAB — GLUCOSE, CAPILLARY
GLUCOSE-CAPILLARY: 96 mg/dL (ref 70–99)
GLUCOSE-CAPILLARY: 89 mg/dL (ref 70–99)
GLUCOSE-CAPILLARY: 87 mg/dL (ref 70–99)
GLUCOSE-CAPILLARY: 133 mg/dL — AB (ref 70–99)

## 2013-08-05 MED ORDER — ADULT MULTIVITAMIN W/MINERALS CH
1.0000 | ORAL_TABLET | Freq: Every day | ORAL | Status: DC
  Administered 2013-08-06 – 2013-08-07 (×2): 1 via ORAL
  Filled 2013-08-05 (×2): qty 1

## 2013-08-05 MED ORDER — IPRATROPIUM-ALBUTEROL 0.5-2.5 (3) MG/3ML IN SOLN
3.0000 mL | Freq: Three times a day (TID) | RESPIRATORY_TRACT | Status: DC
  Administered 2013-08-05 – 2013-08-07 (×6): 3 mL via RESPIRATORY_TRACT
  Filled 2013-08-05 (×4): qty 3

## 2013-08-05 MED ORDER — PREDNISONE 20 MG PO TABS
30.0000 mg | ORAL_TABLET | Freq: Every day | ORAL | Status: DC
  Administered 2013-08-06 – 2013-08-07 (×2): 30 mg via ORAL
  Filled 2013-08-05 (×3): qty 1

## 2013-08-05 MED ORDER — DOXYCYCLINE HYCLATE 100 MG PO TABS
100.0000 mg | ORAL_TABLET | Freq: Two times a day (BID) | ORAL | Status: DC
  Administered 2013-08-06 – 2013-08-07 (×3): 100 mg via ORAL
  Filled 2013-08-05 (×5): qty 1

## 2013-08-05 MED ORDER — ALBUTEROL SULFATE (2.5 MG/3ML) 0.083% IN NEBU: 2.5000 mg | INHALATION_SOLUTION | RESPIRATORY_TRACT | Status: DC | PRN

## 2013-08-05 NOTE — Progress Notes (Signed)
Ref: Birdie Riddle, MD   Subjective:  Feeling better. Wheezing continues. Afebrile.  Objective:  Vital Signs in the last 24 hours: Temp:  [97.5 F (36.4 C)-97.9 F (36.6 C)] 97.9 F (36.6 C) (07/31 2122) Pulse Rate:  [65-82] 76 (08/01 0955) Cardiac Rhythm:  [-] Normal sinus rhythm (07/31 2000) Resp:  [18] 18 (07/31 2122) BP: (132-146)/(66-87) 132/66 mmHg (08/01 0955) SpO2:  [94 %-99 %] 99 % (08/01 0745)  Physical Exam: BP Readings from Last 1 Encounters:  08/05/13 132/66    Wt Readings from Last 1 Encounters:  08/03/13 94.666 kg (208 lb 11.2 oz)    Weight change:   HEENT: [place]/AT, Eyes-Brown, PERL, EOMI, Conjunctiva-Pink, Sclera-Non-icteric Neck: No JVD, No bruit, Trachea midline. Lungs:  Wheezing, Bilateral. Cardiac:  Regular rhythm, normal S1 and S2, no S3.  Abdomen:  Soft, non-tender. Extremities:  No edema present. No cyanosis. No clubbing. CNS: AxOx3, Cranial nerves grossly intact, moves all 4 extremities. Right handed. Skin: Warm and dry.   Intake/Output from previous day: 07/31 0701 - 08/01 0700 In: 696 [I.V.:346; IV Piggyback:350] Out: -     Lab Results: BMET    Component Value Date/Time   NA 142 08/04/2013 0414   NA 141 08/03/2013 1947   NA 140 03/05/2012 1820   K 4.0 08/04/2013 0414   K 3.4* 08/03/2013 1947   K 3.6 03/05/2012 1820   CL 103 08/04/2013 0414   CL 99 08/03/2013 1947   CL 102 03/05/2012 1820   CO2 25 08/04/2013 0414   CO2 27 08/03/2013 1947   CO2 30 03/05/2012 1820   GLUCOSE 159* 08/04/2013 0414   GLUCOSE 129* 08/03/2013 1947   GLUCOSE 95 03/05/2012 1820   BUN 16 08/04/2013 0414   BUN 12 08/03/2013 1947   BUN 10 03/05/2012 1820   CREATININE 0.81 08/04/2013 0414   CREATININE 0.86 08/03/2013 1947   CREATININE 0.64 03/05/2012 1820   CALCIUM 9.3 08/04/2013 0414   CALCIUM 9.2 08/03/2013 1947   CALCIUM 9.2 03/05/2012 1820   GFRNONAA 79* 08/04/2013 0414   GFRNONAA 73* 08/03/2013 1947   GFRNONAA >90 03/05/2012 1820   GFRAA >90 08/04/2013 0414   GFRAA 85* 08/03/2013  1947   GFRAA >90 03/05/2012 1820   CBC    Component Value Date/Time   WBC 12.2* 08/04/2013 0414   RBC 4.25 08/04/2013 0414   HGB 13.8 08/04/2013 0414   HCT 41.8 08/04/2013 0414   PLT 244 08/04/2013 0414   MCV 98.4 08/04/2013 0414   MCH 32.5 08/04/2013 0414   MCHC 33.0 08/04/2013 0414   RDW 13.2 08/04/2013 0414   LYMPHSABS 2.4 08/03/2013 1947   MONOABS 0.9 08/03/2013 1947   EOSABS 0.8* 08/03/2013 1947   BASOSABS 0.0 08/03/2013 1947   HEPATIC Function Panel No results found for this basename: PROT,  ALBUMIN,  AST,  ALT,  ALKPHOS,  BILIDIR,  IBILI,  in the last 8760 hours HEMOGLOBIN A1C No components found with this basename: HGA1C,  MPG   CARDIAC ENZYMES No results found for this basename: CKTOTAL, CKMB, CKMBINDEX, TROPONINI   BNP  Recent Labs  08/03/13 1947  PROBNP 386.7*   TSH No results found for this basename: TSH,  in the last 8760 hours CHOLESTEROL No results found for this basename: CHOL,  in the last 8760 hours  Scheduled Meds: . albuterol  2.5 mg Nebulization Q6H  . azithromycin  500 mg Intravenous Q24H  . cefTRIAXone (ROCEPHIN)  IV  1 g Intravenous Q24H  . docusate sodium  100  mg Oral BID  . glipiZIDE  2.5 mg Oral Q breakfast  . heparin  5,000 Units Subcutaneous 3 times per day  . hydrOXYzine  25 mg Oral Q6H  . insulin aspart  0-15 Units Subcutaneous TID WC  . metoprolol  50 mg Oral BID  . multivitamin with minerals  1 tablet Oral Daily  . PARoxetine  40 mg Oral Daily  . potassium chloride  20 mEq Oral BID  . [START ON 08/06/2013] predniSONE  30 mg Oral Daily  . sodium chloride  3 mL Intravenous Q12H  . sodium chloride  3 mL Intravenous Q12H   Continuous Infusions: . 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 DC telemetry.     LOS: 2 days    [name]  MD  08/05/2013, 11:00 AM

## 2013-08-05 NOTE — Progress Notes (Deleted)
This patient is receiving Azithromycin 500 mg IV q24h. Based on criteria approved by the Pharmacy and Therapeutics Committee, this medication is being converted to the equivalent oral dose form. These criteria include:   . The patient is eating (either orally or per tube) and/or has been taking other orally administered medications for at least 24 hours.  . This patient has no evidence of active gastrointestinal bleeding or impaired GI absorption (gastrectomy, short bowel, patient on TNA or NPO).   If you have questions about this conversion, please contact the pharmacy department.  Muaad Boehning A, [facility] 08/05/2013 2:38 PM

## 2013-08-06 LAB — BASIC METABOLIC PANEL
Anion gap: 10 (ref 5–15)
BUN: 24 mg/dL — ABNORMAL HIGH (ref 6–23)
CO2: 28 mEq/L (ref 19–32)
Calcium: 9.6 mg/dL (ref 8.4–10.5)
Chloride: 103 mEq/L (ref 96–112)
Creatinine, Ser: 0.77 mg/dL (ref 0.50–1.10)
GLUCOSE: 82 mg/dL (ref 70–99)
POTASSIUM: 4.4 meq/L (ref 3.7–5.3)
SODIUM: 141 meq/L (ref 137–147)

## 2013-08-06 LAB — GLUCOSE, CAPILLARY
GLUCOSE-CAPILLARY: 94 mg/dL (ref 70–99)
Glucose-Capillary: 83 mg/dL (ref 70–99)
Glucose-Capillary: 131 mg/dL — ABNORMAL HIGH (ref 70–99)

## 2013-08-06 LAB — CBC
HCT: 39.8 % (ref 36.0–46.0)
Hemoglobin: 12.8 g/dL (ref 12.0–15.0)
MCH: 31.8 pg (ref 26.0–34.0)
MCHC: 32.2 g/dL (ref 30.0–36.0)
MCV: 99 fL (ref 78.0–100.0)
PLATELETS: 272 10*3/uL (ref 150–400)
RBC: 4.02 MIL/uL (ref 3.87–5.11)
RDW: 13.1 % (ref 11.5–15.5)
WBC: 10.6 10*3/uL — ABNORMAL HIGH (ref 4.0–10.5)

## 2013-08-06 MED ORDER — NICOTINE 14 MG/24HR TD PT24
14.0000 mg | MEDICATED_PATCH | Freq: Every day | TRANSDERMAL | Status: DC
  Administered 2013-08-06 – 2013-08-07 (×2): 14 mg via TRANSDERMAL
  Filled 2013-08-06 (×2): qty 1

## 2013-08-06 MED ORDER — METOPROLOL TARTRATE 25 MG PO TABS
25.0000 mg | ORAL_TABLET | Freq: Two times a day (BID) | ORAL | Status: DC
  Administered 2013-08-06 – 2013-08-07 (×2): 25 mg via ORAL
  Filled 2013-08-06 (×3): qty 1

## 2013-08-06 MED ORDER — POTASSIUM CHLORIDE CRYS ER 10 MEQ PO TBCR
10.0000 meq | EXTENDED_RELEASE_TABLET | Freq: Every day | ORAL | Status: DC
  Administered 2013-08-07: 10 meq via ORAL
  Filled 2013-08-06: qty 1

## 2013-08-06 NOTE — Progress Notes (Signed)
Ref: Birdie Riddle, MD   Subjective:  Afebrile. Wheezing continues.  Objective:  Vital Signs in the last 24 hours: Temp:  [97.8 F (36.6 C)-98.3 F (36.8 C)] 98.1 F (36.7 C) (08/02 0505) Pulse Rate:  [57-71] 57 (08/02 0505) Cardiac Rhythm:  [-]  Resp:  [16-20] 20 (08/02 0505) BP: (129-150)/(73-87) 141/75 mmHg (08/02 0505) SpO2:  [93 %-100 %] 96 % (08/02 0800)  Physical Exam: BP Readings from Last 1 Encounters:  08/06/13 141/75    Wt Readings from Last 1 Encounters:  08/03/13 94.666 kg (208 lb 11.2 oz)    Weight change:   HEENT: [place]/AT, Eyes-Brown, PERL, EOMI, Conjunctiva-Pink, Sclera-Non-icteric Neck: No JVD, No bruit, Trachea midline. Lungs:  Wheezing, Bilateral. Cardiac:  Regular rhythm, normal S1 and S2, no S3.  Abdomen:  Soft, non-tender. Extremities:  No edema present. No cyanosis. No clubbing. CNS: AxOx3, Cranial nerves grossly intact, moves all 4 extremities. Right handed. Skin: Warm and dry.   Intake/Output from previous day: 08/01 0701 - 08/02 0700 In: 1556.5 [P.O.:960; I.V.:546.5; IV Piggyback:50] Out: 3 [Urine:2; Stool:1]    Lab Results: BMET    Component Value Date/Time   NA 141 08/06/2013 0521   NA 142 08/04/2013 0414   NA 141 08/03/2013 1947   K 4.4 08/06/2013 0521   K 4.0 08/04/2013 0414   K 3.4* 08/03/2013 1947   CL 103 08/06/2013 0521   CL 103 08/04/2013 0414   CL 99 08/03/2013 1947   CO2 28 08/06/2013 0521   CO2 25 08/04/2013 0414   CO2 27 08/03/2013 1947   GLUCOSE 82 08/06/2013 0521   GLUCOSE 159* 08/04/2013 0414   GLUCOSE 129* 08/03/2013 1947   BUN 24* 08/06/2013 0521   BUN 16 08/04/2013 0414   BUN 12 08/03/2013 1947   CREATININE 0.77 08/06/2013 0521   CREATININE 0.81 08/04/2013 0414   CREATININE 0.86 08/03/2013 1947   CALCIUM 9.6 08/06/2013 0521   CALCIUM 9.3 08/04/2013 0414   CALCIUM 9.2 08/03/2013 1947   GFRNONAA >90 08/06/2013 0521   GFRNONAA 79* 08/04/2013 0414   GFRNONAA 73* 08/03/2013 1947   GFRAA >90 08/06/2013 0521   GFRAA >90 08/04/2013 0414   GFRAA  85* 08/03/2013 1947   CBC    Component Value Date/Time   WBC 10.6* 08/06/2013 0521   RBC 4.02 08/06/2013 0521   HGB 12.8 08/06/2013 0521   HCT 39.8 08/06/2013 0521   PLT 272 08/06/2013 0521   MCV 99.0 08/06/2013 0521   MCH 31.8 08/06/2013 0521   MCHC 32.2 08/06/2013 0521   RDW 13.1 08/06/2013 0521   LYMPHSABS 2.4 08/03/2013 1947   MONOABS 0.9 08/03/2013 1947   EOSABS 0.8* 08/03/2013 1947   BASOSABS 0.0 08/03/2013 1947   HEPATIC Function Panel No results found for this basename: PROT,  ALBUMIN,  AST,  ALT,  ALKPHOS,  BILIDIR,  IBILI,  in the last 8760 hours HEMOGLOBIN A1C No components found with this basename: HGA1C,  MPG   CARDIAC ENZYMES No results found for this basename: CKTOTAL, CKMB, CKMBINDEX, TROPONINI   BNP  Recent Labs  08/03/13 1947  PROBNP 386.7*   TSH No results found for this basename: TSH,  in the last 8760 hours CHOLESTEROL No results found for this basename: CHOL,  in the last 8760 hours  Scheduled Meds: . cefTRIAXone (ROCEPHIN)  IV  1 g Intravenous Q24H  . doxycycline  100 mg Oral Q12H  . glipiZIDE  2.5 mg Oral Q breakfast  . heparin  5,000 Units Subcutaneous 3 times  per day  . hydrOXYzine  25 mg Oral Q6H  . insulin aspart  0-15 Units Subcutaneous TID WC  . ipratropium-albuterol  3 mL Nebulization TID  . metoprolol  25 mg Oral BID  . multivitamin with minerals  1 tablet Oral Daily  . PARoxetine  40 mg Oral Daily  . [START ON 08/07/2013] potassium chloride  10 mEq Oral Daily  . predniSONE  30 mg Oral Daily  . sodium chloride  3 mL Intravenous Q12H  . sodium chloride  3 mL Intravenous Q12H   Continuous Infusions: . 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 Increase activity    LOS: 3 days    [name]  MD  08/06/2013, 11:34 AM

## 2013-08-06 NOTE — Progress Notes (Signed)
Provided smoking and tobacco cessation education.

## 2013-08-07 LAB — GLUCOSE, CAPILLARY
Glucose-Capillary: 115 mg/dL — ABNORMAL HIGH (ref 70–99)
Glucose-Capillary: 104 mg/dL — ABNORMAL HIGH (ref 70–99)

## 2013-08-07 MED ORDER — PREDNISONE 20 MG PO TABS: 20.0000 mg | ORAL_TABLET | Freq: Every day | ORAL | Status: DC

## 2013-08-07 MED ORDER — DOXYCYCLINE HYCLATE 100 MG PO TABS: 100.0000 mg | ORAL_TABLET | Freq: Two times a day (BID) | ORAL | Status: DC

## 2013-08-07 MED ORDER — PREDNISONE 20 MG PO TABS
20.0000 mg | ORAL_TABLET | Freq: Every day | ORAL | Status: DC
  Filled 2013-08-07: qty 1

## 2013-08-07 MED ORDER — GUAIFENESIN-DM 100-10 MG/5ML PO SYRP: 5.0000 mL | ORAL_SOLUTION | ORAL | Status: DC | PRN

## 2013-08-07 MED ORDER — NICOTINE 14 MG/24HR TD PT24
14.0000 mg | MEDICATED_PATCH | Freq: Every day | TRANSDERMAL | Status: DC
Start: 2013-08-07 — End: 2015-01-02

## 2013-08-07 MED ORDER — ALBUTEROL SULFATE (2.5 MG/3ML) 0.083% IN NEBU: 2.5000 mg | INHALATION_SOLUTION | RESPIRATORY_TRACT | Status: DC | PRN

## 2013-08-07 MED ORDER — POTASSIUM CHLORIDE CRYS ER 10 MEQ PO TBCR: 10.0000 meq | EXTENDED_RELEASE_TABLET | Freq: Every day | ORAL | Status: DC

## 2013-08-07 MED ORDER — METOPROLOL TARTRATE 50 MG PO TABS: 25.0000 mg | ORAL_TABLET | Freq: Two times a day (BID) | ORAL | Status: DC

## 2013-08-07 NOTE — Progress Notes (Signed)
[facility] is providing the following services: Nebulizer  If patient discharges after hours, please call [phone].   Linward Headland 08/07/2013, 12:24 PM

## 2013-08-07 NOTE — Progress Notes (Signed)
Patient ambulated in hallways >200 feet with minimal shortness of breath. Oxygen saturation on room air before ambulation was 94 %. During and after ambulation, oxygen saturation remained >92%. Setzer, Marchelle Folks

## 2013-08-07 NOTE — Discharge Summary (Signed)
Physician Discharge Summary  Patient ID: Deborah Mcintyre MRN: [id number] DOB/AGE: 1955/06/18 58 y.o.  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 Her CT scan was negative for pulmonary embolism but positive for atypical pneumonia. Due to QT prolongation azithromycin was discontinued and doxycycline and prednisone with albuterol nebulizer treatment was given. She improved in 48-72 hours and was discharged home in stable condition with oxygen saturation of 94 % on room air. Patient agreed to quit smoking with xanax and nicotine patch use.  Consults: cardiology  Significant Diagnostic Studies: labs: Elevated WBC count of 13.4 K, normal Hgb and platelets count. Mild hypokalemia with K+ level of 3.4 and mildly high glucose of 129 mg.  Chest x-ray unremarkable.  CT of chest showed  1. No pulmonary emboli.  2. 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   Treatments: antibiotics: ceftriaxone and Doxycycline  Discharge Exam: Blood pressure 149/82, pulse 66, temperature 98.4 F (36.9 C), temperature source Oral, resp. rate 20, height 5\' 7"  (1.702 m), weight 94.666 kg (208 lb 11.2 oz), SpO2 94.00%. HEENT: [place]/AT, Eyes-Brown, PERL, EOMI, Conjunctiva-Pink, Sclera-Non-icteric  Neck: No  JVD, No bruit, Trachea midline.  Lungs: Wheezing, Bilateral.  Cardiac: Regular rhythm, normal S1 and S2, no S3.  Abdomen: Soft, non-tender.  Extremities: No edema present. No cyanosis. No clubbing.  CNS: AxOx3, Cranial nerves grossly intact, moves all 4 extremities. Right handed.  Skin: Warm and dry.   Disposition: 01-Home or Self Care     Medication List    STOP taking these medications       albuterol 108 (90 BASE) MCG/ACT inhaler  Commonly known as:  PROVENTIL HFA;VENTOLIN HFA  Replaced by:  albuterol (2.5 MG/3ML) 0.083% nebulizer solution     BC HEADACHE POWDER PO     cephALEXin 500 MG capsule  Commonly known as:  KEFLEX     phenazopyridine 200 MG tablet  Commonly known as:  PYRIDIUM      TAKE these medications       albuterol (2.5 MG/3ML) 0.083% nebulizer solution  Commonly known as:  PROVENTIL  Take 3 mLs (2.5 mg total) by nebulization every 4 (four) hours as needed for wheezing or shortness of breath.     ALPRAZolam 0.5 MG tablet  Commonly known as:  XANAX  Take 0.5 mg by mouth 3 (three) times daily as needed for sleep or anxiety.     doxycycline 100 MG tablet  Commonly known as:  VIBRA-TABS  Take 1 tablet (100 mg total) by mouth every 12 (twelve) hours.     guaiFENesin-dextromethorphan 100-10 MG/5ML syrup  Commonly known as:  ROBITUSSIN DM  Take 5 mLs by mouth every 4 (four) hours as needed for cough.     HYDROcodone-acetaminophen 5-325 MG per tablet  Commonly known as:  NORCO/VICODIN  Take 1 tablet by mouth every 4 (four) hours as needed.  hydrOXYzine 25 MG tablet  Commonly known as:  ATARAX/VISTARIL  Take 1 tablet (25 mg total) by mouth every 6 (six) hours.     ibuprofen 200 MG tablet  Commonly known as:  ADVIL,MOTRIN  Take 800 mg by mouth daily as needed for pain.     ipratropium-albuterol 0.5-2.5 (3) MG/3ML Soln  Commonly known as:  DUONEB  Take 3 mLs by nebulization every 6 (six) hours as needed (wheezing).     metoprolol 50 MG tablet   Commonly known as:  LOPRESSOR  Take 0.5 tablets (25 mg total) by mouth 2 (two) times daily.     PARoxetine 20 MG tablet  Commonly known as:  PAXIL  Take 40 mg by mouth every morning.     potassium chloride 10 MEQ tablet  Commonly known as:  K-DUR,KLOR-CON  Take 1 tablet (10 mEq total) by mouth daily.     predniSONE 20 MG tablet  Commonly known as:  DELTASONE  Take 1 tablet (20 mg total) by mouth daily with breakfast.           Follow-up Information   Follow up with [facility], MD. Schedule an appointment as soon as possible for a visit in 1 week.   Specialty:  Cardiology   Contact information:   [place] [address] [phone]       Signed: Birdie Riddle 08/07/2013, 9:44 AM

## 2013-12-06 ENCOUNTER — Other Ambulatory Visit: Payer: Self-pay | Admitting: Cardiovascular Disease

## 2013-12-06 ENCOUNTER — Ambulatory Visit
Admission: RE | Admit: 2013-12-06 | Discharge: 2013-12-06 | Disposition: A | Discharge status: Home / Self Care | Payer: Medicare Other | Source: Ambulatory Visit | Attending: Cardiovascular Disease | Admitting: Cardiovascular Disease

## 2013-12-06 DIAGNOSIS — R202 Paresthesia of skin: Secondary | ICD-10-CM

## 2014-03-08 ENCOUNTER — Other Ambulatory Visit (HOSPITAL_COMMUNITY)
Admission: RE | Admit: 2014-03-08 | Discharge: 2014-03-08 | Disposition: A | Discharge status: Home / Self Care | Payer: Medicare Other | Source: Ambulatory Visit | Attending: Emergency Medicine | Admitting: Emergency Medicine

## 2014-03-08 ENCOUNTER — Emergency Department (INDEPENDENT_AMBULATORY_CARE_PROVIDER_SITE_OTHER): Payer: Medicare Other

## 2014-03-08 ENCOUNTER — Emergency Department (INDEPENDENT_AMBULATORY_CARE_PROVIDER_SITE_OTHER)
Admission: EM | Admit: 2014-03-08 | Discharge: 2014-03-08 | Disposition: A | Discharge status: Home / Self Care | Payer: Medicare Other | Source: Home / Self Care | Attending: Emergency Medicine | Admitting: Emergency Medicine

## 2014-03-08 ENCOUNTER — Encounter (HOSPITAL_COMMUNITY): Payer: Self-pay | Admitting: Emergency Medicine

## 2014-03-08 DIAGNOSIS — N760 Acute vaginitis: Secondary | ICD-10-CM | POA: Insufficient documentation

## 2014-03-08 DIAGNOSIS — Z113 Encounter for screening for infections with a predominantly sexual mode of transmission: Secondary | ICD-10-CM | POA: Insufficient documentation

## 2014-03-08 DIAGNOSIS — M25559 Pain in unspecified hip: Secondary | ICD-10-CM

## 2014-03-08 MED ORDER — MELOXICAM 15 MG PO TABS: 15.0000 mg | ORAL_TABLET | Freq: Every day | ORAL | Status: DC

## 2014-03-08 MED ORDER — FLUCONAZOLE 150 MG PO TABS: 150.0000 mg | ORAL_TABLET | Freq: Once | ORAL | Status: DC

## 2014-03-08 MED ORDER — METRONIDAZOLE 500 MG PO TABS: 500.0000 mg | ORAL_TABLET | Freq: Two times a day (BID) | ORAL | Status: DC

## 2014-03-08 NOTE — ED Notes (Signed)
C/o vaginal itching, complaint for 2 weeks.

## 2014-03-08 NOTE — ED Provider Notes (Signed)
Chief Complaint   Vaginal Itching   History of Present Illness   Deborah Mcintyre is a 59 year old female who presents for 2 problems tonight: Vaginal discharge, and left hip pain.  Vaginal discharge has been going on for about 2 weeks. Patient describes a thick, white discharge, itching, odor, and slight dysuria. She denies any external lesions. She's had some back pain but no lower abdominal pain. She's had chills but no fever, and some nausea but no vomiting. She has a history of urinary tract infections before and gets yeast infections after antibiotics.  Her second complaint has been that of left lateral hip pain with radiation to the lateral thigh. This occurred after falling about 2 months ago. She denies any swelling or bruising. It hurts to walk. There is no numbness, tingling, weakness in the leg. She denies any lower back pain. No pain in the buttock or the groin.  Review of Systems   Other than as noted above, the patient denies any of the following symptoms: Systemic:  No fever or chills GI:  No abdominal pain, nausea, vomiting, diarrhea, constipation, melena or hematochezia. GU:  No dysuria, frequency, urgency, hematuria, vaginal discharge, itching, or abnormal vaginal bleeding. Musculoskeletal: No other joint pain or swelling. Neurological: No numbness, tingling, weakness, bladder or bowel dysfunction, or saddle anesthesia.  [place]   Past medical history, family history, social history, meds, and allergies were reviewed.  She takes paroxetine, Xanax, oxycodone, and Lopressor. She has a history of COPD, hypertension, and chronic, generalized musculoskeletal pain. She see Dr. Tera Helper for her chronic pain problems who allows her to take 1 oxycodone per day.  Physical Examination    Vital signs:  BP 174/110 mmHg  Pulse 97  Temp(Src) 98.8 F (37.1 C) (Oral)  Resp 16  SpO2 97% General:  Alert, oriented and in no distress. Lungs:  Breath sounds clear and equal  bilaterally.  No wheezes, rales or rhonchi. Heart:  Regular rhythm.  No gallops or murmers. Abdomen:  Soft, flat and non-distended.  No organomegaly or mass.  No tenderness, guarding or rebound.  Bowel sounds normally active. Pelvic exam:  Slight swelling of external genitalia but no erythema and no external lesions. Vaginal and cervical mucosa were normal, but there was a large amount of frothy, white, malodorous discharge. No blood in the vaginal vault. No pain on cervical motion. Uterus was normal in size and shape and nontender. No adnexal masses or tenderness.  DNA probes for gonorrhea, Chlamydia, Trichomonas, Gardnerella, Candida were obtained. Extremities: Exam of the hip reveals pain to palpation over the greater trochanter. No swelling or bruising. The hip had a decreased range of motion with diminished internal and external rotation and pain with both internal and external movement. Corky Sox and Fadir maneuvers were positive. Skin:  Clear, warm and dry.  Radiology   Dg Hip Unilat With Pelvis 2-3 Views Left  03/08/2014   CLINICAL DATA:  Fall 2 months ago with pain when walking in the left leg. Initial encounter.  EXAM: LEFT HIP (WITH PELVIS) 2-3 VIEWS  COMPARISON:  Lumbar spine radiography 09/16/2008.  FINDINGS: No evidence of pelvic ring or hip fracture. Both hips are located. No focal bone lesion.  There is mild degenerative marginal spurring about both hips without significant joint narrowing.  A approximately 3 cm peripherally calcified structure in the right pelvis has not enlarged over multiple years.  IMPRESSION: No acute osseous findings.   Electronically Signed   By: Monte Fantasia M.D.   On:  03/08/2014 21:01    I reviewed the images independently and personally and concur with the radiologist's findings.  Assessment   The primary encounter diagnosis was Vaginitis. A diagnosis of Hip pain was also pertinent to this visit.  Differential diagnosis was vaginitis is Candida, bacterial  vaginosis, or trichomonas. Will treat pending results of DNA probes.  For the hip pain, don't see any evidence of osteoarthritis of the hip or lumbar radiculopathy. I think this is probably tendinitis or bursitis and will treat conservatively for right now with anti-inflammatories and exercises. If no improvement after a couple weeks, suggested follow-up with orthopedics.      Plan    1.  Meds:  The following meds were prescribed:   New Prescriptions   FLUCONAZOLE (DIFLUCAN) 150 MG TABLET    Take 1 tablet (150 mg total) by mouth once.   MELOXICAM (MOBIC) 15 MG TABLET    Take 1 tablet (15 mg total) by mouth daily.   METRONIDAZOLE (FLAGYL) 500 MG TABLET    Take 1 tablet (500 mg total) by mouth 2 (two) times daily.    2.  Patient Education/Counseling:  The patient was given appropriate handouts, self care instructions, and instructed in symptomatic relief.  Given hip exercises to do followed by ice.  3.  Follow up:  The patient was told to follow up here if no better in 3 to 4 days, or sooner if becoming worse in any way, and given some red flag symptoms such as worsening pain, fever, persistent vomiting, or heavy vaginal bleeding which would prompt immediate return.       Harden Mo, MD 03/08/14 2133

## 2014-03-08 NOTE — Discharge Instructions (Signed)
Most hip pain is caused by osteoarthritis, bursitis, or tendonitis.  Simple measures plus regular gentle exercises can help.  Do not do the following:  Avoid squatting and doing deep knee bends.  This puts too much of load on your cartiledges and tendons.  If you do a knee bend, go only half way down, flexing your knee no more than 90 degrees.  Avoid sleeping on the side that hurts.  Do the following:  If you are overweight or obese, lose weight.  This makes for a lot less load on your hip joints.  If you use tobacco, quit.  Nicotine causes spasm of the small arteries, decreases blood flow, and impairs your body's normal ability to repair damage.  If your hip is acutely inflamed, use the principles of RICE (rest, ice, compression, and elevation).  Use of over the counter pain meds can be of help.  Tylenol (or acetaminophen) is the safest to use.  It often helps to take this regularly.  You can take up to 2 325 mg tablets 5 times daily, but it best to start out much lower that that, perhaps 2 325 mg tablets twice daily, then increase from there. People who are on the blood thinner warfarin have to be careful about taking high doses of Tylenol.  For people who are able to tolerate them, ibuprofen and naproxyn can also help with the pain.  You should discuss these agents with your physician before taking them.  People with chronic kidney disease, hypertension, peptic ulcer disease, and reflux can suffer adverse side effects. They should not be taken with warfarin. The maximum dosage of ibuprofen is 800 mg 3 times daily with meals.  The maximum dosage of naprosyn is 2 and 1/2 tablets twice daily with food, but again, start out low and gradually increase the dose until adequate pain relief is achieved. Ibuprofen and naprosyn should always be taken with food.  People with cartiledge injury or osteoarthritis may find glucosamine to be helpful.  This is an over-the-counter supplement that helps nourish and  repair cartiledge.  The dose is 500 mg 3 times daily or 1500 mg taken in a single dose. This can take several months to work and it doesn't always work.    For people with hip pain on just one side, use of a cane held in the hand on the same side as the hip pain takes some of the stress off the hip joint and can make a big difference in hip pain.  Wearing good shoes with adequate arch support is essential. Use of an orthotic insert can be very helpful.  These can be purchased at a shoe store or inexpensive inserts can be gotten at the drug store.  Regular exercise is of utmost importance.  Swimming, water aerobics, low impact aerobics, yoga or tai chi are helpful.  Use of an elliptical exerciser put the least stress on the hips of any type of exercise machine.  Finally doing the exercises below can be very helpful. Try to do them twice a day followed by ice for 10 minutes.          Vaginitis Vaginitis is an inflammation of the vagina. It is most often caused by a change in the normal balance of the bacteria and yeast that live in the vagina. This change in balance causes an overgrowth of certain bacteria or yeast, which causes the inflammation. There are different types of vaginitis, but the most common types are:  Bacterial vaginosis.  Yeast infection (candidiasis).  Trichomoniasis vaginitis. This is a sexually transmitted infection (STI).  Viral vaginitis.  Atropic vaginitis.  Allergic vaginitis. CAUSES  The cause depends on the type of vaginitis. Vaginitis can be caused by:  Bacteria (bacterial vaginosis).  Yeast (yeast infection).  A parasite (trichomoniasis vaginitis)  A virus (viral vaginitis).  Low hormone levels (atrophic vaginitis). Low hormone levels can occur during pregnancy, breastfeeding, or after menopause.  Irritants, such as bubble baths, scented tampons, and feminine sprays (allergic vaginitis). Other factors can change the normal balance of the yeast  and bacteria that live in the vagina. These include:  Antibiotic medicines.  Poor hygiene.  Diaphragms, vaginal sponges, spermicides, birth control pills, and intrauterine devices (IUD).  Sexual intercourse.  Infection.  Uncontrolled diabetes.  A weakened immune system. SYMPTOMS  Symptoms can vary depending on the cause of the vaginitis. Common symptoms include:  Abnormal vaginal discharge.  The discharge is white, gray, or yellow with bacterial vaginosis.  The discharge is thick, white, and cheesy with a yeast infection.  The discharge is frothy and yellow or greenish with trichomoniasis.  A bad vaginal odor.  The odor is fishy with bacterial vaginosis.  Vaginal itching, pain, or swelling.  Painful intercourse.  Pain or burning when urinating. Sometimes, there are no symptoms. TREATMENT  Treatment will vary depending on the type of infection.   Bacterial vaginosis and trichomoniasis are often treated with antibiotic creams or pills.  Yeast infections are often treated with antifungal medicines, such as vaginal creams or suppositories.  Viral vaginitis has no cure, but symptoms can be treated with medicines that relieve discomfort. Your sexual partner should be treated as well.  Atrophic vaginitis may be treated with an estrogen cream, pill, suppository, or vaginal ring. If vaginal dryness occurs, lubricants and moisturizing creams may help. You may be told to avoid scented soaps, sprays, or douches.  Allergic vaginitis treatment involves quitting the use of the product that is causing the problem. Vaginal creams can be used to treat the symptoms. HOME CARE INSTRUCTIONS   Take all medicines as directed by your caregiver.  Keep your genital area clean and dry. Avoid soap and only rinse the area with water.  Avoid douching. It can remove the healthy bacteria in the vagina.  Do not use tampons or have sexual intercourse until your vaginitis has been treated. Use  sanitary pads while you have vaginitis.  Wipe from front to back. This avoids the spread of bacteria from the rectum to the vagina.  Let air reach your genital area.  Wear cotton underwear to decrease moisture buildup.  Avoid wearing underwear while you sleep until your vaginitis is gone.  Avoid tight pants and underwear or nylons without a cotton panel.  Take off wet clothing (especially bathing suits) as soon as possible.  Use mild, non-scented products. Avoid using irritants, such as:  Scented feminine sprays.  Fabric softeners.  Scented detergents.  Scented tampons.  Scented soaps or bubble baths.  Practice safe sex and use condoms. Condoms may prevent the spread of trichomoniasis and viral vaginitis. SEEK MEDICAL CARE IF:   You have abdominal pain.  You have a fever or persistent symptoms for more than 2-3 days.  You have a fever and your symptoms suddenly get worse. Document Released: 10/19/2006 Document Revised: 09/16/2011 Document Reviewed: 06/04/2011 [facility] Patient Information 2015 [place], [place]. This information is not intended to replace advice given to you by your health care provider. Make sure you discuss any questions you have  with your health care provider. ° °

## 2014-03-09 LAB — CERVICOVAGINAL ANCILLARY ONLY
Chlamydia: NEGATIVE
Neisseria Gonorrhea: NEGATIVE

## 2014-03-12 LAB — CERVICOVAGINAL ANCILLARY ONLY
Wet Prep (BD Affirm): NEGATIVE
Wet Prep (BD Affirm): NEGATIVE
Wet Prep (BD Affirm): POSITIVE — AB

## 2014-03-12 NOTE — ED Notes (Signed)
GC/Chlamydia neg., Affirm: Candida and Trich neg., Gardnerella pos.  Pt. adequately treated with Flagyl. Roselyn Meier 03/12/2014

## 2014-04-22 ENCOUNTER — Emergency Department (INDEPENDENT_AMBULATORY_CARE_PROVIDER_SITE_OTHER)
Admission: EM | Admit: 2014-04-22 | Discharge: 2014-04-22 | Disposition: A | Discharge status: Home / Self Care | Payer: Medicare Other | Source: Home / Self Care | Attending: Family Medicine | Admitting: Family Medicine

## 2014-04-22 ENCOUNTER — Encounter (HOSPITAL_COMMUNITY): Payer: Self-pay | Admitting: *Deleted

## 2014-04-22 DIAGNOSIS — R05 Cough: Secondary | ICD-10-CM | POA: Diagnosis not present

## 2014-04-22 DIAGNOSIS — T2015XA Burn of first degree of scalp [any part], initial encounter: Secondary | ICD-10-CM | POA: Diagnosis not present

## 2014-04-22 DIAGNOSIS — J988 Other specified respiratory disorders: Secondary | ICD-10-CM

## 2014-04-22 DIAGNOSIS — L299 Pruritus, unspecified: Secondary | ICD-10-CM

## 2014-04-22 DIAGNOSIS — R059 Cough, unspecified: Secondary | ICD-10-CM

## 2014-04-22 MED ORDER — HYDROXYZINE HCL 25 MG PO TABS: 25.0000 mg | ORAL_TABLET | Freq: Four times a day (QID) | ORAL | Status: DC | PRN

## 2014-04-22 MED ORDER — AZITHROMYCIN 250 MG PO TABS: ORAL_TABLET | ORAL | Status: DC

## 2014-04-22 MED ORDER — SILVER SULFADIAZINE 1 % EX CREA: 1.0000 "application " | TOPICAL_CREAM | Freq: Two times a day (BID) | CUTANEOUS | Status: DC
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
## 2014-04-22 NOTE — Discharge Instructions (Signed)
Burn Care Your skin is a natural barrier to infection. It is the largest organ of your body. Burns damage this natural protection. To help prevent infection, it is very important to follow your caregiver's instructions in the care of your burn. Burns are classified as:  First degree. There is only redness of the skin (erythema). No scarring is expected.  Second degree. There is blistering of the skin. Scarring may occur with deeper burns.  Third degree. All layers of the skin are injured, and scarring is expected. HOME CARE INSTRUCTIONS   Wash your hands well before changing your bandage.  Change your bandage as often as directed by your caregiver.  Remove the old bandage. If the bandage sticks, you may soak it off with cool, clean water.  Cleanse the burn thoroughly but gently with mild soap and water.  Pat the area dry with a clean, dry cloth.  Apply a thin layer of antibacterial cream to the burn.  Apply a clean bandage as instructed by your caregiver.  Keep the bandage as clean and dry as possible.  Elevate the affected area for the first 24 hours, then as instructed by your caregiver.  Only take over-the-counter or prescription medicines for pain, discomfort, or fever as directed by your caregiver. SEEK IMMEDIATE MEDICAL CARE IF:   You develop excessive pain.  You develop redness, tenderness, swelling, or red streaks near the burn.  The burned area develops yellowish-white fluid (pus) or a bad smell.  You have a fever. MAKE SURE YOU:   Understand these instructions.  Will watch your condition.  Will get help right away if you are not doing well or get worse. Document Released: 12/22/2004 Document Revised: 03/16/2011 Document Reviewed: 05/14/2010 [facility] Patient Information 2015 [place], [place]. This information is not intended to replace advice given to you by your health care provider. Make sure you discuss any questions you have with your health care  provider.    Keep the area clean and dry. Use antibacterial soap only. Use a thin layer of cream twice a day until healing occurs. If worsens please f/u in the ED.  Will use ATarax for itching.  Covering you with an antibiotic for cough.

## 2014-04-22 NOTE — ED Provider Notes (Signed)
CSN: [id number]     Arrival date & time 04/22/14  1543 History   First MD Initiated Contact with Patient 04/22/14 1636     Chief Complaint  Patient presents with  . Burn  . Cough   (Consider location/radiation/quality/duration/timing/severity/associated sxs/prior Treatment) HPI Comments: Patient presents with 2 issues today.  She reports a "chemical" burn to the scalp and left ear x 1 week following a relaxer to the hair. She has been using hydrogen peroxide without relief. It is "terribly itchy" and painful at times.  She also reports a cough x 1 week up to 10 days. Normal greenish mucus, without fevers, but noted malaise and mild wheezing. History of controlled COPD.   Patient is a 59 y.o. female presenting with burn and cough. The history is provided by the patient.  Burn Associated symptoms: cough   Associated symptoms: no shortness of breath   Cough Associated symptoms: rash   Associated symptoms: no chills, no fever, no shortness of breath and no wheezing     Past Medical History  Diagnosis Date  . Hypertension   . Pneumonia   . Bronchitis   . Arthritis   . COPD (chronic obstructive pulmonary disease)    Past Surgical History  Procedure Laterality Date  . Cesarean section     No family history on file. History  Substance Use Topics  . Smoking status: Current Every Day Smoker -- 0.00 packs/day  . Smokeless tobacco: Not on file  . Alcohol Use: No   OB History    No data available     Review of Systems  Constitutional: Positive for fatigue. Negative for fever and chills.  HENT: Negative.   Respiratory: Positive for cough. Negative for shortness of breath, wheezing and stridor.   Cardiovascular: Negative.   Musculoskeletal: Negative.   Skin: Positive for rash. Negative for color change and wound.  Allergic/Immunologic: Negative.   Psychiatric/Behavioral: Negative.     Allergies  Review of patient's allergies indicates no known allergies.  Home Medications    Prior to Admission medications   Medication Sig Start Date End Date Taking? Authorizing Provider  albuterol (PROVENTIL) (2.5 MG/3ML) 0.083% nebulizer solution Take 3 mLs (2.5 mg total) by nebulization every 4 (four) hours as needed for wheezing or shortness of breath. 08/07/13  Yes [name], MD  ALPRAZolam Duanne Moron) 0.5 MG tablet Take 0.5 mg by mouth 3 (three) times daily as needed for sleep or anxiety.   Yes Historical Provider, MD  ipratropium-albuterol (DUONEB) 0.5-2.5 (3) MG/3ML SOLN Take 3 mLs by nebulization every 6 (six) hours as needed (wheezing).   Yes Historical Provider, MD  metoprolol (LOPRESSOR) 50 MG tablet Take 0.5 tablets (25 mg total) by mouth 2 (two) times daily. 08/07/13  Yes [name], MD  Nutritional Supplements (VITAMIN D MAINTENANCE PO) Take by mouth.   Yes Historical Provider, MD  oxyCODONE-acetaminophen (PERCOCET) 7.5-325 MG per tablet Take 1 tablet by mouth every 4 (four) hours as needed for severe pain.   Yes Historical Provider, MD  PARoxetine (PAXIL) 20 MG tablet Take 40 mg by mouth every morning.     Yes Historical Provider, MD  azithromycin (ZITHROMAX Z-PAK) 250 MG tablet 1 pack as directed 04/22/14   Bjorn Pippin, PA-C  doxycycline (VIBRA-TABS) 100 MG tablet Take 1 tablet (100 mg total) by mouth every 12 (twelve) hours. 08/07/13   [name], MD  fluconazole (DIFLUCAN) 150 MG tablet Take 1 tablet (150 mg total) by mouth once. 03/08/14   Harden Mo,  MD  guaiFENesin-dextromethorphan (ROBITUSSIN DM) 100-10 MG/5ML syrup Take 5 mLs by mouth every 4 (four) hours as needed for cough. 08/07/13   [name], MD  HYDROcodone-acetaminophen (NORCO/VICODIN) 5-325 MG per tablet Take 1 tablet by mouth every 4 (four) hours as needed. 12/04/12   Domenic Moras, PA-C  hydrOXYzine (ATARAX/VISTARIL) 25 MG tablet Take 1 tablet (25 mg total) by mouth every 6 (six) hours as needed for itching. 04/22/14   Bjorn Pippin, PA-C  ibuprofen (ADVIL,MOTRIN) 200 MG tablet Take 800 mg by mouth  daily as needed for pain.    Historical Provider, MD  meloxicam (MOBIC) 15 MG tablet Take 1 tablet (15 mg total) by mouth daily. 03/08/14   Harden Mo, MD  metroNIDAZOLE (FLAGYL) 500 MG tablet Take 1 tablet (500 mg total) by mouth 2 (two) times daily. 03/08/14   Harden Mo, MD  nicotine (NICODERM CQ - DOSED IN MG/24 HOURS) 14 mg/24hr patch Place 1 patch (14 mg total) onto the skin daily. 08/07/13   [name], MD  potassium chloride (K-DUR,KLOR-CON) 10 MEQ tablet Take 1 tablet (10 mEq total) by mouth daily. 08/07/13   [name], MD  predniSONE (DELTASONE) 20 MG tablet Take 1 tablet (20 mg total) by mouth daily with breakfast. 08/07/13   [name], MD  silver sulfADIAZINE (SILVADENE) 1 % cream Apply 1 application topically 2 (two) times daily. Use on clean and dry skin twice a day for burn reaction 04/22/14   Bjorn Pippin, PA-C   BP 111/72 mmHg  Pulse 94  Temp(Src) 99.2 F (37.3 C)  Resp 18  SpO2 96% Physical Exam  Constitutional: She is oriented to person, place, and time. She appears well-developed and well-nourished. No distress.  HENT:  Head: Normocephalic and atraumatic.  Mouth/Throat: Oropharynx is clear and moist. No oropharyngeal exudate.  Eyes: Pupils are equal, round, and reactive to light.  Neck: Normal range of motion. Neck supple.  Pulmonary/Chest: Effort normal and breath sounds normal.  Few rhonchi in the lower bases.   Musculoskeletal: Normal range of motion.  Lymphadenopathy:    She has no cervical adenopathy.  Neurological: She is alert and oriented to person, place, and time.  Skin: Skin is warm and dry. Rash noted. She is not diaphoretic. There is erythema.  Superficial burns to the left temporal scalp and left ear with excoriation and erythema. No full thickness is noted.   Psychiatric: Her behavior is normal.  Nursing note and vitals reviewed.   ED Course  Procedures (including critical care time) Labs Review Labs Reviewed - No data to  display  Imaging Review No results found.   MDM   1. Burn of scalp, first degree, initial encounter   2. Ear itching   3. Respiratory infection   4. Cough    1. Discussed wound care with patient and treat with Silvadene cream locally without extra regimens OTC. Keep clean and dry. Atarax renewed for itching.  3. Cover with Z-max empirically based on duration of cough. OTC symptomatic relief of cough.     Bjorn Pippin, PA-C 04/22/14 1742

## 2014-05-12 ENCOUNTER — Emergency Department (HOSPITAL_COMMUNITY)
Admission: EM | Admit: 2014-05-12 | Discharge: 2014-05-12 | Discharge status: Against Medical Advice | Payer: Medicare Other | Attending: Emergency Medicine | Admitting: Emergency Medicine

## 2014-05-12 ENCOUNTER — Encounter (HOSPITAL_COMMUNITY): Payer: Self-pay | Admitting: *Deleted

## 2014-05-12 DIAGNOSIS — I10 Essential (primary) hypertension: Secondary | ICD-10-CM | POA: Insufficient documentation

## 2014-05-12 DIAGNOSIS — T20419A Corrosion of unspecified degree of unspecified ear [any part, except ear drum], initial encounter: Secondary | ICD-10-CM | POA: Diagnosis not present

## 2014-05-12 DIAGNOSIS — T5494XA Toxic effect of unspecified corrosive substance, undetermined, initial encounter: Secondary | ICD-10-CM | POA: Diagnosis not present

## 2014-05-12 DIAGNOSIS — Z4800 Encounter for change or removal of nonsurgical wound dressing: Secondary | ICD-10-CM | POA: Insufficient documentation

## 2014-05-12 DIAGNOSIS — Y999 Unspecified external cause status: Secondary | ICD-10-CM | POA: Diagnosis not present

## 2014-05-12 DIAGNOSIS — J449 Chronic obstructive pulmonary disease, unspecified: Secondary | ICD-10-CM | POA: Insufficient documentation

## 2014-05-12 DIAGNOSIS — X58XXXA Exposure to other specified factors, initial encounter: Secondary | ICD-10-CM | POA: Insufficient documentation

## 2014-05-12 DIAGNOSIS — Y929 Unspecified place or not applicable: Secondary | ICD-10-CM | POA: Insufficient documentation

## 2014-05-12 DIAGNOSIS — Y939 Activity, unspecified: Secondary | ICD-10-CM | POA: Insufficient documentation

## 2014-05-12 DIAGNOSIS — T2047XA Corrosion of unspecified degree of neck, initial encounter: Secondary | ICD-10-CM | POA: Diagnosis not present

## 2014-05-12 DIAGNOSIS — Z720 Tobacco use: Secondary | ICD-10-CM | POA: Diagnosis not present

## 2014-05-12 DIAGNOSIS — T304 Corrosion of unspecified body region, unspecified degree: Secondary | ICD-10-CM

## 2014-05-12 MED ORDER — HYDROCODONE-ACETAMINOPHEN 5-325 MG PO TABS
2.0000 | ORAL_TABLET | Freq: Once | ORAL | Status: AC
  Administered 2014-05-12: 2 via ORAL
  Filled 2014-05-12: qty 2

## 2014-05-12 MED ORDER — DIPHENHYDRAMINE HCL 25 MG PO CAPS
25.0000 mg | ORAL_CAPSULE | Freq: Once | ORAL | Status: AC
  Administered 2014-05-12: 25 mg via ORAL
  Filled 2014-05-12: qty 1

## 2014-05-12 NOTE — ED Notes (Signed)
Pt up at desk asking  If she is discharged, NT to inform MD pt would like to go home.

## 2014-05-12 NOTE — ED Notes (Signed)
Patient presents with c/o neck and ear hurting from a chemical burn (seen here 2 1/2 weeks ago for the same)  Tonight she states she is hurting bad, used the antibiotics and lotion prescribed and is out of her pain med.

## 2014-05-12 NOTE — Discharge Instructions (Signed)
Stop using topical antibiotic ointments as this may be the reason why he has itching and persistent rash. Try topical steroid cream hydrocortisone over the counter from pharmacy.  Return for fevers, pus draining or new concerns.  If you were given medicines take as directed.  If you are on coumadin or contraceptives realize their levels and effectiveness is altered by many different medicines.  If you have any reaction (rash, tongues swelling, other) to the medicines stop taking and see a physician.   Please follow up as directed and return to the ER or see a physician for new or worsening symptoms.  Thank you. Filed Vitals:   05/12/14 1922 05/12/14 2002 05/12/14 2030 05/12/14 2100  BP: 135/94 140/92 160/84 145/80  Pulse: 65 66 61 61  Temp: 97.9 F (36.6 C)     TempSrc: Oral     Resp: 18 16    Height: '5\' 7"'$  (1.702 m)     Weight: 215 lb (97.523 kg)     SpO2: 100% 98% 99% 98%

## 2014-08-15 ENCOUNTER — Other Ambulatory Visit: Payer: Self-pay | Admitting: Cardiovascular Disease

## 2014-08-15 ENCOUNTER — Ambulatory Visit
Admission: RE | Admit: 2014-08-15 | Discharge: 2014-08-15 | Disposition: A | Discharge status: Home / Self Care | Payer: Medicare Other | Source: Ambulatory Visit | Attending: Cardiovascular Disease | Admitting: Cardiovascular Disease

## 2014-08-15 DIAGNOSIS — J189 Pneumonia, unspecified organism: Secondary | ICD-10-CM

## 2014-12-06 ENCOUNTER — Encounter (HOSPITAL_COMMUNITY): Payer: Self-pay

## 2014-12-06 ENCOUNTER — Emergency Department (HOSPITAL_COMMUNITY)
Admission: EM | Admit: 2014-12-06 | Discharge: 2014-12-06 | Disposition: A | Discharge status: Home / Self Care | Payer: Medicare Other | Attending: Emergency Medicine | Admitting: Emergency Medicine

## 2014-12-06 DIAGNOSIS — I10 Essential (primary) hypertension: Secondary | ICD-10-CM | POA: Diagnosis not present

## 2014-12-06 DIAGNOSIS — M1990 Unspecified osteoarthritis, unspecified site: Secondary | ICD-10-CM | POA: Insufficient documentation

## 2014-12-06 DIAGNOSIS — N63 Unspecified lump in unspecified breast: Secondary | ICD-10-CM

## 2014-12-06 DIAGNOSIS — Z7952 Long term (current) use of systemic steroids: Secondary | ICD-10-CM | POA: Insufficient documentation

## 2014-12-06 DIAGNOSIS — B373 Candidiasis of vulva and vagina: Secondary | ICD-10-CM | POA: Insufficient documentation

## 2014-12-06 DIAGNOSIS — J449 Chronic obstructive pulmonary disease, unspecified: Secondary | ICD-10-CM | POA: Diagnosis not present

## 2014-12-06 DIAGNOSIS — F17200 Nicotine dependence, unspecified, uncomplicated: Secondary | ICD-10-CM | POA: Diagnosis not present

## 2014-12-06 DIAGNOSIS — Z79899 Other long term (current) drug therapy: Secondary | ICD-10-CM | POA: Insufficient documentation

## 2014-12-06 DIAGNOSIS — B3731 Acute candidiasis of vulva and vagina: Secondary | ICD-10-CM

## 2014-12-06 DIAGNOSIS — Z791 Long term (current) use of non-steroidal anti-inflammatories (NSAID): Secondary | ICD-10-CM | POA: Diagnosis not present

## 2014-12-06 DIAGNOSIS — Z8701 Personal history of pneumonia (recurrent): Secondary | ICD-10-CM | POA: Insufficient documentation

## 2014-12-06 LAB — URINALYSIS, ROUTINE W REFLEX MICROSCOPIC
Bilirubin Urine: NEGATIVE
GLUCOSE, UA: NEGATIVE mg/dL
Ketones, ur: NEGATIVE mg/dL
Nitrite: POSITIVE — AB
Protein, ur: NEGATIVE mg/dL
Specific Gravity, Urine: 1.025 (ref 1.005–1.030)
pH: 5.5 (ref 5.0–8.0)

## 2014-12-06 LAB — URINE MICROSCOPIC-ADD ON

## 2014-12-06 MED ORDER — HYDROCODONE-ACETAMINOPHEN 5-325 MG PO TABS
1.0000 | ORAL_TABLET | ORAL | Status: DC | PRN
Start: 2014-12-06 — End: 2015-01-18

## 2014-12-06 MED ORDER — FLUCONAZOLE 100 MG PO TABS
150.0000 mg | ORAL_TABLET | Freq: Once | ORAL | Status: AC
  Administered 2014-12-06: 150 mg via ORAL
  Filled 2014-12-06: qty 2

## 2014-12-06 MED ORDER — BENZONATATE 100 MG PO CAPS: 100.0000 mg | ORAL_CAPSULE | Freq: Three times a day (TID) | ORAL | Status: DC

## 2014-12-06 MED ORDER — HYDROCODONE-ACETAMINOPHEN 5-325 MG PO TABS
1.0000 | ORAL_TABLET | Freq: Once | ORAL | Status: AC
  Administered 2014-12-06: 1 via ORAL
  Filled 2014-12-06: qty 1

## 2014-12-06 NOTE — ED Notes (Signed)
Pt here with c/o of a very painful breast mass to left breast and reports she noticed it a few weeks ago. She has not had a mammogram done. She is also reporting clear malodorous vaginal discharge, onset one week ago.

## 2014-12-06 NOTE — ED Provider Notes (Signed)
CSN: [id number]     Arrival date & time 12/06/14  1722 History   First MD Initiated Contact with Patient 12/06/14 2048     Chief Complaint  Patient presents with  . Breast Mass     (Consider location/radiation/quality/duration/timing/severity/associated sxs/prior Treatment) HPI Comments: Patient with a history of HTN, COPD presents with complaint of painful breast mass in left breast. She does regular breast self-exams and noticed a small, nontender mass one month ago that has grown in size and has become painful over that time. No fever, redness, nipple discharge or bleeding. She reports a normal mammogram history with her last study 2 years ago. She had multiple benign breast cysts removed in the distant past.   She is also having a vaginal discharge that is malodorous. She reports vaginal itching and denies vaginal or pelvic pain. No recent antibiotics. No dysuria, fever, abdominal pain.  The history is provided by the patient. No language interpreter was used.    Past Medical History  Diagnosis Date  . Hypertension   . Pneumonia   . Bronchitis   . Arthritis   . COPD (chronic obstructive pulmonary disease) [facility])    Past Surgical History  Procedure Laterality Date  . Cesarean section     No family history on file. Social History  Substance Use Topics  . Smoking status: Current Every Day Smoker -- 0.00 packs/day  . Smokeless tobacco: Never Used  . Alcohol Use: 0.0 oz/week     Comment: ocassionally   OB History    No data available     Review of Systems  Constitutional: Negative for fever and chills.  Respiratory: Positive for cough. Negative for shortness of breath.        With h/o COPD  Cardiovascular: Negative.        C/O left breast pain  Gastrointestinal: Negative.  Negative for nausea and abdominal pain.  Genitourinary: Positive for vaginal discharge. Negative for dysuria, vaginal bleeding and vaginal pain.  Musculoskeletal: Negative.  Negative for myalgias.   Neurological: Negative.       Allergies  Review of patient's allergies indicates no known allergies.  Home Medications   Prior to Admission medications   Medication Sig Start Date End Date Taking? Authorizing Provider  albuterol (PROVENTIL) (2.5 MG/3ML) 0.083% nebulizer solution Take 3 mLs (2.5 mg total) by nebulization every 4 (four) hours as needed for wheezing or shortness of breath. 08/07/13  Yes [name], MD  ALPRAZolam Duanne Moron) 0.5 MG tablet Take 0.5 mg by mouth 3 (three) times daily as needed for anxiety or sleep.   Yes Historical Provider, MD  ibuprofen (ADVIL,MOTRIN) 200 MG tablet Take 800 mg by mouth daily as needed for pain.   Yes Historical Provider, MD  ipratropium-albuterol (DUONEB) 0.5-2.5 (3) MG/3ML SOLN Take 3 mLs by nebulization every 6 (six) hours as needed (wheezing).   Yes Historical Provider, MD  metoprolol (LOPRESSOR) 50 MG tablet Take 0.5 tablets (25 mg total) by mouth 2 (two) times daily. Patient taking differently: Take 100 mg by mouth 2 (two) times daily.  08/07/13  Yes [name], MD  oxyCODONE-acetaminophen (PERCOCET/ROXICET) 5-325 MG tablet Take 1 tablet by mouth daily.   Yes Historical Provider, MD  PARoxetine (PAXIL) 20 MG tablet Take 20 mg by mouth 2 (two) times daily.    Yes Historical Provider, MD  azithromycin (ZITHROMAX Z-PAK) 250 MG tablet 1 pack as directed 04/22/14   Bjorn Pippin, PA-C  doxycycline (VIBRA-TABS) 100 MG tablet Take 1 tablet (100 mg total)  by mouth every 12 (twelve) hours. 08/07/13   [name], MD  fluconazole (DIFLUCAN) 150 MG tablet Take 1 tablet (150 mg total) by mouth once. 03/08/14   Harden Mo, MD  guaiFENesin-dextromethorphan (ROBITUSSIN DM) 100-10 MG/5ML syrup Take 5 mLs by mouth every 4 (four) hours as needed for cough. 08/07/13   [name], MD  HYDROcodone-acetaminophen (NORCO/VICODIN) 5-325 MG per tablet Take 1 tablet by mouth every 4 (four) hours as needed. 12/04/12   Domenic Moras, PA-C  hydrOXYzine (ATARAX/VISTARIL)  25 MG tablet Take 1 tablet (25 mg total) by mouth every 6 (six) hours as needed for itching. 04/22/14   Bjorn Pippin, PA-C  meloxicam (MOBIC) 15 MG tablet Take 1 tablet (15 mg total) by mouth daily. 03/08/14   Harden Mo, MD  metroNIDAZOLE (FLAGYL) 500 MG tablet Take 1 tablet (500 mg total) by mouth 2 (two) times daily. 03/08/14   Harden Mo, MD  nicotine (NICODERM CQ - DOSED IN MG/24 HOURS) 14 mg/24hr patch Place 1 patch (14 mg total) onto the skin daily. 08/07/13   [name], MD  oxyCODONE-acetaminophen (PERCOCET) 7.5-325 MG per tablet Take 1 tablet by mouth every 4 (four) hours as needed for severe pain.    Historical Provider, MD  potassium chloride (K-DUR,KLOR-CON) 10 MEQ tablet Take 1 tablet (10 mEq total) by mouth daily. 08/07/13   [name], MD  predniSONE (DELTASONE) 20 MG tablet Take 1 tablet (20 mg total) by mouth daily with breakfast. 08/07/13   [name], MD  silver sulfADIAZINE (SILVADENE) 1 % cream Apply 1 application topically 2 (two) times daily. Use on clean and dry skin twice a day for burn reaction 04/22/14   Vanessa Luray Young, PA-C   BP 131/80 mmHg  Pulse 66  Temp(Src) 98.8 F (37.1 C) (Oral)  Resp 18  SpO2 96% Physical Exam  Constitutional: She is oriented to person, place, and time. She appears well-developed and well-nourished.  HENT:  Head: Normocephalic.  Mouth/Throat: Oropharynx is clear and moist.  Eyes: Conjunctivae are normal.  Neck: Normal range of motion. Neck supple.  Cardiovascular: Normal rate.   Pulmonary/Chest: Effort normal.  Large breasts that are symmetric. Palpable tender large (golf ball size) mass in upper outer quadrant left breast. No surrounding redness. No warmth. No left axillary adenopathy. Nipple is non-tender without bleeding or discharge.   Abdominal: Soft. Bowel sounds are normal. There is no tenderness. There is no rebound and no guarding.  Genitourinary:  Thick, white discharge in vaginal vault. No CMT, adnexal mass or  tenderness.   Musculoskeletal: Normal range of motion.  Neurological: She is alert and oriented to person, place, and time. Coordination normal.  Skin: Skin is warm and dry. No rash noted.  Psychiatric: She has a normal mood and affect.    ED Course  Procedures (including critical care time) Labs Review Labs Reviewed  URINALYSIS, ROUTINE W REFLEX MICROSCOPIC (NOT AT [facility]) - Abnormal; Notable for the following:    APPearance CLOUDY (*)    Hgb urine dipstick MODERATE (*)    Nitrite POSITIVE (*)    Leukocytes, UA SMALL (*)    All other components within normal limits  URINE MICROSCOPIC-ADD ON - Abnormal; Notable for the following:    Squamous Epithelial / LPF 6-30 (*)    Bacteria, UA MANY (*)    Casts HYALINE CASTS (*)    All other components within normal limits  WET PREP, GENITAL  GC/CHLAMYDIA PROBE AMP () NOT AT [facility]  Imaging Review No results found. I have personally reviewed and evaluated these images and lab results as part of my medical decision-making.   EKG Interpretation None      MDM   Final diagnoses:  None    1. Breast mass 2. Vaginal discharge.  Vaginal discharge appears c/w yeast vaginitis. No bimanual tenderness. Wet prep unable to be completed in the lab as the specimen was contaminated during transport. No repeat study thought necessary. Will treat for yeast.   Breast mass will require mammogram and GYN follow up. Discussed with Nira Conn (nurse midwife at [facility]) who will arrange follow up mammorgram (ordered tonight) and post-exam office visit for management of mass.     Charlann Lange, PA-C 12/06/14 [place], MD 12/07/14 [phone]

## 2014-12-06 NOTE — ED Provider Notes (Signed)
compalins of breast mass for the past 1 month, becoming progressively more painful. . On exam patient has golf ball sized mass to left breast, lower lateral quadrant which is tender. No fluctuance no redness of skin. Nipple is normal  Orlie Dakin, MD 12/07/14 0124

## 2014-12-06 NOTE — Discharge Instructions (Signed)
YOU WILL BE CONTACTED BY [facility] A MAMMOGRAM AND A FOLLOW UP IN-OFFICE APPOINTMENT IN EVALUATION OF PAINFUL BREAST MASS. IF YOU HAVE NOT HEARD FROM WOMEN[facility]S YOU CAN CALL TO CHECK ON STATUS OF APPOINTMENTS BY CALLING THE OUTPATIENT CLINIC AT [facility].

## 2014-12-07 LAB — GC/CHLAMYDIA PROBE AMP (~~LOC~~) NOT AT ARMC
CHLAMYDIA, DNA PROBE: NEGATIVE
NEISSERIA GONORRHEA: NEGATIVE

## 2014-12-10 ENCOUNTER — Other Ambulatory Visit: Payer: Self-pay | Admitting: Cardiovascular Disease

## 2014-12-10 DIAGNOSIS — N644 Mastodynia: Secondary | ICD-10-CM

## 2014-12-10 DIAGNOSIS — N6320 Unspecified lump in the left breast, unspecified quadrant: Secondary | ICD-10-CM

## 2014-12-12 ENCOUNTER — Other Ambulatory Visit: Payer: Medicare Other

## 2014-12-17 ENCOUNTER — Encounter: Payer: Medicare Other | Admitting: Obstetrics and Gynecology

## 2014-12-19 ENCOUNTER — Other Ambulatory Visit: Payer: Self-pay | Admitting: Cardiovascular Disease

## 2014-12-19 ENCOUNTER — Ambulatory Visit
Admission: RE | Admit: 2014-12-19 | Discharge: 2014-12-19 | Disposition: A | Discharge status: Home / Self Care | Payer: Medicare Other | Source: Ambulatory Visit | Attending: Cardiovascular Disease | Admitting: Cardiovascular Disease

## 2014-12-19 DIAGNOSIS — N644 Mastodynia: Secondary | ICD-10-CM

## 2014-12-19 DIAGNOSIS — N6320 Unspecified lump in the left breast, unspecified quadrant: Secondary | ICD-10-CM

## 2014-12-24 ENCOUNTER — Telehealth: Payer: Self-pay | Admitting: Hematology and Oncology

## 2014-12-24 NOTE — Telephone Encounter (Signed)
new breast appt-s/w patient and gave np appt for 12/27 @ 3:45 w/Dr. Lindi Adie Referring Dr. [name]   Referring information scanned

## 2014-12-25 ENCOUNTER — Telehealth: Payer: Self-pay | Admitting: *Deleted

## 2014-12-25 NOTE — Telephone Encounter (Signed)
Mailed packet to pt.

## 2014-12-26 ENCOUNTER — Ambulatory Visit
Admission: RE | Admit: 2014-12-26 | Discharge: 2014-12-26 | Disposition: A | Discharge status: Home / Self Care | Payer: Medicare Other | Source: Ambulatory Visit | Attending: Cardiovascular Disease | Admitting: Cardiovascular Disease

## 2014-12-26 ENCOUNTER — Other Ambulatory Visit: Payer: Self-pay | Admitting: Cardiovascular Disease

## 2014-12-26 DIAGNOSIS — N6320 Unspecified lump in the left breast, unspecified quadrant: Secondary | ICD-10-CM

## 2014-12-26 DIAGNOSIS — R599 Enlarged lymph nodes, unspecified: Secondary | ICD-10-CM

## 2015-01-01 ENCOUNTER — Ambulatory Visit (HOSPITAL_BASED_OUTPATIENT_CLINIC_OR_DEPARTMENT_OTHER): Payer: Medicare Other | Admitting: Hematology and Oncology

## 2015-01-01 ENCOUNTER — Other Ambulatory Visit: Payer: Self-pay | Admitting: *Deleted

## 2015-01-01 ENCOUNTER — Encounter: Payer: Self-pay | Admitting: Hematology and Oncology

## 2015-01-01 VITALS — BP 144/85 | HR 80 | Temp 98.5°F | Resp 18 | Ht 67.0 in | Wt 210.9 lb

## 2015-01-01 DIAGNOSIS — C50412 Malignant neoplasm of upper-outer quadrant of left female breast: Secondary | ICD-10-CM | POA: Diagnosis present

## 2015-01-01 DIAGNOSIS — Z171 Estrogen receptor negative status [ER-]: Secondary | ICD-10-CM

## 2015-01-01 MED ORDER — OXYCODONE-ACETAMINOPHEN 5-325 MG PO TABS
1.0000 | ORAL_TABLET | Freq: Three times a day (TID) | ORAL | Status: DC | PRN
Start: 2015-01-01 — End: 2015-01-18

## 2015-01-01 NOTE — Assessment & Plan Note (Addendum)
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  Pathology counseling: Discussed with the patient, the details of pathology including the type of breast cancer,the clinical staging, the significance of ER, PR and HER-2/neu receptors and the implications for treatment. After reviewing the pathology in detail, we proceeded to discuss the different treatment options between surgery, radiation, chemotherapy.  Recommendation: 1. Staging scans with CT scans and bone scans 2. Neoadjuvant chemotherapy (Assuming she does not have metastatic disease) 3. Port placement (Dr.Rosenbower consulted) 4. Chemotherapy education 5. Echocardiogram 6. Start chemotherapy in 01/11/15  Type of chemotherapy would depend on her testing.  If the HER-2 is negative then I would treat her with dose dense Adriamycin and Cytoxan 4 followed by Taxol and carboplatin 12 weekly If HER-2 is positive, then she will be treated with Calvary every 3 weeks 6 followed by Herceptin maintenance  Return to clinic this Friday to discuss final treatment plan.

## 2015-01-01 NOTE — Progress Notes (Signed)
Deborah Mcintyre CONSULT NOTE  Patient Care Team: [name], MD as PCP - General (Internal Medicine)  CHIEF COMPLAINTS/PURPOSE OF CONSULTATION:  Newly diagnosed breast cancer  HISTORY OF PRESENTING ILLNESS:  Deborah Mcintyre 59 y.o. female is here because of recent diagnosis of Left breast cancer. Patient has had previous breast lumps which were cysts. She notices the current lump in the left breast about 4-6 months ago. She ignored this for a long time until recently started to become red and painful. She went to the emergency room and was found to have referred to the breast center. She underwent a mammogram and ultrasound guided biopsy which confirmed that this was invasive ductal carcinoma with lymphovascular invasion. She had an enlarged axillary lymph node which was also biopsy-proven to be metastatic carcinoma. So far we have received the estrogen and progesterone receptors which are both negative. Ki-67 was 80%. She has been thickening diffusely with peau de orange appearance and redness suggestive of inflammatory breast cancer. She is having a lot of pain in the breast and ran out of her pain medications.  She is accompanied today by her significant other [name] who runs a newspaper distribution business.  I reviewed her records extensively and collaborated the history with the patient.  SUMMARY OF ONCOLOGIC HISTORY:   Breast cancer of upper-outer quadrant of left female breast ([place])   12/19/2014 Mammogram Left breast palpable mass at 2:00: 4 x 3.8 x 2.5 cm, enlarged left axillary lymph node 4 cm in size with diffuse skin thickening Peau de Orange T4 N1 (Stage 3B)   12/26/2014 Initial Diagnosis Left breast biopsy: Invasive ductal carcinoma with lymphovascular invasion, 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, grade 3, ER/PR negative, Ki-67 80% HER-2 pending    In terms of breast cancer risk profile:  She menarched at early age of 23 and went  to menopause at age 32  She had 4 pregnancies, her first child was born at age 30  She has received birth control pills for approximately 3 years.  She was never exposed to fertility medications or hormone replacement therapy.  She has  family history of Breast/GYN/GI cancer Maternal aunt breast cancer, sister had unknown cancer; 3 first cousins had cancers of all kinds she is not sure of  MEDICAL HISTORY:  Past Medical History  Diagnosis Date  . Hypertension   . Pneumonia   . Bronchitis   . Arthritis   . COPD (chronic obstructive pulmonary disease) ([place])     SURGICAL HISTORY: Past Surgical History  Procedure Laterality Date  . Cesarean section      SOCIAL HISTORY: Social History   Social History  . Marital Status: Married    Spouse Name: N/A  . Number of Children: N/A  . Years of Education: N/A   Occupational History  . Not on file.   Social History Main Topics  . Smoking status: Current Every Day Smoker -- 0.00 packs/day  . Smokeless tobacco: Never Used  . Alcohol Use: 0.0 oz/week     Comment: ocassionally  . Drug Use: No  . Sexual Activity: Not on file   Other Topics Concern  . Not on file   Social History Narrative    FAMILY HISTORY: maternal aunt with breast cancer  ALLERGIES:  has No Known Allergies.  MEDICATIONS:  Current Outpatient Prescriptions  Medication Sig Dispense Refill  . albuterol (PROVENTIL) (2.5 MG/3ML) 0.083% nebulizer solution Take 3 mLs (2.5 mg total) by nebulization every 4 (  four) hours as needed for wheezing or shortness of breath. 75 mL 12  . ALPRAZolam (XANAX) 0.5 MG tablet Take 0.5 mg by mouth 3 (three) times daily as needed for anxiety or sleep.    [name] azithromycin (ZITHROMAX Z-PAK) 250 MG tablet 1 pack as directed 1 each 0  . benzonatate (TESSALON) 100 MG capsule Take 1 capsule (100 mg total) by mouth every 8 (eight) hours. 21 capsule 0  . doxycycline (VIBRA-TABS) 100 MG tablet Take 1 tablet (100 mg total) by mouth every 12  (twelve) hours. 14 tablet 0  . fluconazole (DIFLUCAN) 150 MG tablet Take 1 tablet (150 mg total) by mouth once. 1 tablet 0  . guaiFENesin-dextromethorphan (ROBITUSSIN DM) 100-10 MG/5ML syrup Take 5 mLs by mouth every 4 (four) hours as needed for cough. 118 mL 0  . HYDROcodone-acetaminophen (NORCO/VICODIN) 5-325 MG tablet Take 1-2 tablets by mouth every 4 (four) hours as needed. 12 tablet 0  . hydrOXYzine (ATARAX/VISTARIL) 25 MG tablet Take 1 tablet (25 mg total) by mouth every 6 (six) hours as needed for itching. 25 tablet 0  . ibuprofen (ADVIL,MOTRIN) 200 MG tablet Take 800 mg by mouth daily as needed for pain.    [name] ipratropium-albuterol (DUONEB) 0.5-2.5 (3) MG/3ML SOLN Take 3 mLs by nebulization every 6 (six) hours as needed (wheezing).    . meloxicam (MOBIC) 15 MG tablet Take 1 tablet (15 mg total) by mouth daily. 15 tablet 0  . metoprolol (LOPRESSOR) 50 MG tablet Take 0.5 tablets (25 mg total) by mouth 2 (two) times daily. (Patient taking differently: Take 100 mg by mouth 2 (two) times daily. )    . metroNIDAZOLE (FLAGYL) 500 MG tablet Take 1 tablet (500 mg total) by mouth 2 (two) times daily. 14 tablet 0  . nicotine (NICODERM CQ - DOSED IN MG/24 HOURS) 14 mg/24hr patch Place 1 patch (14 mg total) onto the skin daily. 28 patch 1  . oxyCODONE-acetaminophen (PERCOCET/ROXICET) 5-325 MG tablet Take 1 tablet by mouth every 8 (eight) hours as needed for severe pain. 60 tablet 0  . PARoxetine (PAXIL) 20 MG tablet Take 20 mg by mouth 2 (two) times daily.     . potassium chloride (K-DUR,KLOR-CON) 10 MEQ tablet Take 1 tablet (10 mEq total) by mouth daily. 30 tablet 6  . predniSONE (DELTASONE) 20 MG tablet Take 1 tablet (20 mg total) by mouth daily with breakfast. 10 tablet 0  . silver sulfADIAZINE (SILVADENE) 1 % cream Apply 1 application topically 2 (two) times daily. Use on clean and dry skin twice a day for burn reaction 50 g 1   No current facility-administered medications for this visit.    REVIEW  OF SYSTEMS:   Constitutional: Denies fevers, chills or abnormal night sweats Eyes: Denies blurriness of vision, double vision or watery eyes Ears, nose, mouth, throat, and face: Denies mucositis or sore throat Respiratory: Denies cough, dyspnea or wheezes Cardiovascular: Denies palpitation, chest discomfort or lower extremity swelling Gastrointestinal:  Denies nausea, heartburn or change in bowel habits Skin: Denies abnormal skin rashes Lymphatics: Denies new lymphadenopathy or easy bruising Neurological:Denies numbness, tingling or new weaknesses Behavioral/Psych: Mood is stable, no new changes  Breast: painful left breast with a large palpable lump All other systems were reviewed with the patient and are negative.  PHYSICAL EXAMINATION: ECOG PERFORMANCE STATUS: 1 - Symptomatic but completely ambulatory  Filed Vitals:   01/01/15 1612  BP: 144/85  Pulse: 80  Temp: 98.5 F (36.9 C)  Resp: 18   Filed Weights  01/01/15 1612  Weight: 210 lb 14.4 oz (95.664 kg)    GENERAL:alert, no distress and comfortable SKIN: skin color, texture, turgor are normal, no rashes or significant lesions EYES: normal, conjunctiva are pink and non-injected, sclera clear OROPHARYNX:no exudate, no erythema and lips, buccal mucosa, and tongue normal  NECK: supple, thyroid normal size, non-tender, without nodularity LYMPH:  no palpable lymphadenopathy in the cervical, axillary or inguinal LUNGS: clear to auscultation and percussion with normal breathing effort HEART: regular rate & rhythm and no murmurs and no lower extremity edema ABDOMEN:abdomen soft, non-tender and normal bowel sounds Musculoskeletal:no cyanosis of digits and no clubbing  PSYCH: alert & oriented x 3 with fluent speech NEURO: no focal motor/sensory deficits BREAST:large palpable lump in the left breast upper outer quadrant with redness of the skin and Peau de Orange along with a large palpable left axillary lymph node (exam performed  in the presence of a chaperone)   LABORATORY DATA:  I have reviewed the data as listed Lab Results  Component Value Date   WBC 10.6* 08/06/2013   HGB 12.8 08/06/2013   HCT 39.8 08/06/2013   MCV 99.0 08/06/2013   PLT 272 08/06/2013   Lab Results  Component Value Date   NA 141 08/06/2013   K 4.4 08/06/2013   CL 103 08/06/2013   CO2 28 08/06/2013    RADIOGRAPHIC STUDIES: I have personally reviewed the radiological reports and agreed with the findings in the report.  ASSESSMENT AND PLAN:  Breast cancer of upper-outer quadrant of left female breast ([place]) Left breast biopsy 12/26/14: Invasive ductal carcinoma with lymphovascular invasion, 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, grade 3, ER/PR negative Left breast palpable mass at 2:00: 4 x 3.8 x 2.5 cm, enlarged left axillary lymph node 4 cm in size with diffuse skin thickening T4 N1 (Stage 3B).  Pathology counseling: Discussed with the patient, the details of pathology including the type of breast cancer,the clinical staging, the significance of ER, PR and HER-2/neu receptors and the implications for treatment. After reviewing the pathology in detail, we proceeded to discuss the different treatment options between surgery, radiation, chemotherapy.  Recommendation: 1. Staging scans with CT scans and bone scans 2. Neoadjuvant chemotherapy (Assuming she does not have metastatic disease) 3. Port placement (Dr.Rosenbower consulted) 4. Chemotherapy education 5. Echocardiogram 6. Start chemotherapy in 01/11/15  Type of chemotherapy would depend on her testing.  If the HER-2 is negative then I would treat her with dose dense Adriamycin and Cytoxan 4 followed by Taxol and carboplatin 12 weekly If HER-2 is positive, then she will be treated with [place] every 3 weeks 6 followed by Herceptin maintenance  Return to clinic this Friday to discuss final treatment plan.  All questions were answered. The patient  knows to call the clinic with any problems, questions or concerns.    Rulon Eisenmenger, MD 01/01/2015

## 2015-01-02 ENCOUNTER — Other Ambulatory Visit: Payer: Self-pay | Admitting: *Deleted

## 2015-01-02 ENCOUNTER — Telehealth: Payer: Self-pay | Admitting: *Deleted

## 2015-01-02 ENCOUNTER — Encounter: Payer: Self-pay | Admitting: *Deleted

## 2015-01-02 ENCOUNTER — Telehealth: Payer: Self-pay | Admitting: Hematology and Oncology

## 2015-01-02 NOTE — Addendum Note (Signed)
Addended by: Renford Dills on: 01/02/2015 08:08 AM   Modules accepted: Orders, Medications

## 2015-01-02 NOTE — Telephone Encounter (Signed)
Left vm with appt date and time for CT/Bone Scan on 01/03/15 at 9:45AM. Gave pt contact information to call back for more specific instructions.

## 2015-01-02 NOTE — Progress Notes (Signed)
Note created by Dr. Gudena during office visit, copy to patient,original to scan. 

## 2015-01-02 NOTE — Telephone Encounter (Signed)
Aware of follow up and echo to [facility] for precert

## 2015-01-03 ENCOUNTER — Encounter (HOSPITAL_COMMUNITY): Payer: Medicare Other

## 2015-01-03 ENCOUNTER — Telehealth: Payer: Self-pay | Admitting: *Deleted

## 2015-01-03 ENCOUNTER — Ambulatory Visit (HOSPITAL_COMMUNITY): Payer: Medicare Other

## 2015-01-03 NOTE — Assessment & Plan Note (Signed)
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  Pathology counseling: Discussed with the patient, the details of pathology including the type of breast cancer,the clinical staging, the significance of ER, PR and HER-2/neu receptors and the implications for treatment. After reviewing the pathology in detail, we proceeded to discuss the different treatment options between surgery, radiation, chemotherapy.  Recommendation: 1. Staging scans with CT scans and bone scans (patient missed her appointments for CT scans and bone scans) 2. Neoadjuvant chemotherapy (Assuming she does not have metastatic disease) 3. Port placement (Dr.Rosenbower consulted)  Type of chemotherapy would depend on her testing.  If the HER-2 is negative then I would treat her with dose dense Adriamycin and Cytoxan 4 followed by Taxol and carboplatin 12 weekly If HER-2 is positive, then she will be treated with [facility] [place] every 3 weeks 6 followed by Herceptin maintenance 4. Chemotherapy education 5. Echocardiogram 6. Start chemotherapy in 01/11/15

## 2015-01-03 NOTE — Telephone Encounter (Signed)
Called pt this morning to remind her of the scans scheduled today at 9:45. No answer and mailbox is full on VM.

## 2015-01-04 ENCOUNTER — Encounter: Payer: Self-pay | Admitting: *Deleted

## 2015-01-04 ENCOUNTER — Other Ambulatory Visit: Payer: Self-pay | Admitting: *Deleted

## 2015-01-04 ENCOUNTER — Other Ambulatory Visit: Payer: Medicare Other

## 2015-01-04 ENCOUNTER — Telehealth: Payer: Self-pay | Admitting: Hematology and Oncology

## 2015-01-04 ENCOUNTER — Telehealth: Payer: Self-pay | Admitting: *Deleted

## 2015-01-04 ENCOUNTER — Ambulatory Visit (HOSPITAL_BASED_OUTPATIENT_CLINIC_OR_DEPARTMENT_OTHER): Payer: Medicare Other | Admitting: Hematology and Oncology

## 2015-01-04 ENCOUNTER — Encounter: Payer: Self-pay | Admitting: Hematology and Oncology

## 2015-01-04 VITALS — BP 128/78 | HR 78 | Temp 98.1°F | Resp 18 | Ht 67.0 in | Wt 211.4 lb

## 2015-01-04 DIAGNOSIS — Z171 Estrogen receptor negative status [ER-]: Secondary | ICD-10-CM

## 2015-01-04 DIAGNOSIS — C50412 Malignant neoplasm of upper-outer quadrant of left female breast: Secondary | ICD-10-CM

## 2015-01-04 HISTORY — DX: Malignant neoplasm of upper-outer quadrant of left female breast: C50.412

## 2015-01-04 NOTE — Progress Notes (Signed)
Patient Care Team: [name], MD as PCP - General (Internal Medicine)  DIAGNOSIS: No matching staging information was found for the patient.  SUMMARY OF ONCOLOGIC HISTORY:   Breast cancer of upper-outer quadrant of left female breast ([place])   12/19/2014 Mammogram Left breast palpable mass at 2:00: 4 x 3.8 x 2.5 cm, enlarged left axillary lymph node 4 cm in size with diffuse skin thickening Peau de Orange 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   12/26/2014 Initial Diagnosis Left breast biopsy: Invasive ductal carcinoma with lymphovascular invasion, 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, grade 3, ER/PR negative, Ki-67 80% HER-2 Neg    CHIEF COMPLIANT: follow-up to discuss treatment plan  INTERVAL HISTORY: Deborah Mcintyre is a 59 year old with above-mentioned history of 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 She complains of continuing pain in the breast with swelling and lymphedema. The pain is somewhat better with pain medication.we have scheduled her for CT scans and bone scans yesterday but she did not pick up her phone and did not get the scans.  REVIEW OF SYSTEMS:   Constitutional: Denies fevers, chills or abnormal weight loss Eyes: Denies blurriness of vision Ears, nose, mouth, throat, and face: Denies mucositis or sore throat Respiratory: Denies cough, dyspnea or wheezes Cardiovascular: Denies palpitation, chest discomfort Gastrointestinal:  Denies nausea, heartburn or change in bowel habits Skin: Denies abnormal skin rashes Lymphatics: Denies new lymphadenopathy or easy bruising Neurological:Denies numbness, tingling or new weaknesses Behavioral/Psych: Mood is stable, no new changes  Extremities: No lower extremity edema Breast: painful and swollen left breast All other systems were reviewed with the patient and are negative.  I  have reviewed the past medical history, past surgical history, social history and family history with the patient and they are unchanged from previous note.  ALLERGIES:  has No Known Allergies.  MEDICATIONS:  Current Outpatient Prescriptions  Medication Sig Dispense Refill  . albuterol (PROVENTIL) (2.5 MG/3ML) 0.083% nebulizer solution Take 3 mLs (2.5 mg total) by nebulization every 4 (four) hours as needed for wheezing or shortness of breath. 75 mL 12  . ALPRAZolam (XANAX) 0.5 MG tablet Take 0.5 mg by mouth 3 (three) times daily as needed for anxiety or sleep.    [name] amLODipine (NORVASC) 5 MG tablet Take 5 mg by mouth 2 (two) times daily.    . clindamycin (CLEOCIN) 300 MG capsule Take 300 mg by mouth 4 (four) times daily. X 7 days    . HYDROcodone-acetaminophen (NORCO/VICODIN) 5-325 MG tablet Take 1-2 tablets by mouth every 4 (four) hours as needed. 12 tablet 0  . ibuprofen (ADVIL,MOTRIN) 200 MG tablet Take 200 mg by mouth every 6 (six) hours as needed.    . metoprolol (LOPRESSOR) 50 MG tablet Take 0.5 tablets (25 mg total) by mouth 2 (two) times daily. (Patient taking differently: Take 50 mg by mouth 2 (two) times daily. )    . oxyCODONE-acetaminophen (PERCOCET/ROXICET) 5-325 MG tablet Take 1 tablet by mouth every 8 (eight) hours as needed for severe pain. 60 tablet 0  . PARoxetine (PAXIL) 20 MG tablet Take 20 mg by mouth 2 (two) times daily.      No current facility-administered medications for this visit.    PHYSICAL EXAMINATION: ECOG PERFORMANCE STATUS: 1 - Symptomatic but completely ambulatory  Filed Vitals:   01/04/15 1121  BP: 128/78  Pulse: 78  Temp: 98.1 F (36.7 C)  Resp: 18   Filed Weights   01/04/15 1121  Weight: 211 lb 6.4 oz (95.89 kg)    GENERAL:alert, no distress and comfortable SKIN: skin color, texture, turgor are normal, no rashes or significant lesions EYES: normal, Conjunctiva are pink and non-injected, sclera clear OROPHARYNX:no exudate, no erythema and  lips, buccal mucosa, and tongue normal  NECK: supple, thyroid normal size, non-tender, without nodularity LYMPH:  no palpable lymphadenopathy in the cervical, axillary or inguinal LUNGS: clear to auscultation and percussion with normal breathing effort HEART: regular rate & rhythm and no murmurs and no lower extremity edema ABDOMEN:abdomen soft, non-tender and normal bowel sounds MUSCULOSKELETAL:no cyanosis of digits and no clubbing  NEURO: alert & oriented x 3 with fluent speech, no focal motor/sensory deficits EXTREMITIES: No lower extremity edema  LABORATORY DATA:  I have reviewed the data as listed   Chemistry      Component Value Date/Time   NA 141 08/06/2013 0521   K 4.4 08/06/2013 0521   CL 103 08/06/2013 0521   CO2 28 08/06/2013 0521   BUN 24* 08/06/2013 0521   CREATININE 0.77 08/06/2013 0521      Component Value Date/Time   CALCIUM 9.6 08/06/2013 0521   ALKPHOS 55 10/29/2007 2156   AST 18 10/29/2007 2156   ALT 12 10/29/2007 2156   BILITOT 0.5 10/29/2007 2156       Lab Results  Component Value Date   WBC 10.6* 08/06/2013   HGB 12.8 08/06/2013   HCT 39.8 08/06/2013   MCV 99.0 08/06/2013   PLT 272 08/06/2013   NEUTROABS 9.3* 08/03/2013     ASSESSMENT & PLAN:  Breast cancer of upper-outer quadrant of left female breast ([facility]) Left breast biopsy 12/26/14: Invasive ductal carcinoma with lymphovascular invasion, 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  Recommendation: 1. Breast MRI 2. Staging scans with CT scans and bone scans (patient missed her appointments for CT scans and bone scans). They will be rescheduled 3. Neoadjuvant chemotherapy (Assuming she does not have metastatic disease) with dose dense Adriamycin and Cytoxan 4 followed by Taxol and  carboplatin 12 weekly 4. Port placement (Dr.Rosenbower consulted) 5. Chemotherapy education 6. Echocardiogram 7. Start chemotherapy approx 01/18/15 (assuming there are no metastatic lesions)  I discussed with the family the prognosis for inflammatory breast cancer is fairly bad. Even with the most aggressive treatment, her risk of recurrence is going to be extremely high. We are hoping that the CT scans and bone scans would not show evidence of distant metastatic disease.  No orders of the defined types were placed in this encounter.   The patient has a good understanding of the overall plan. she agrees with it. she will call with any problems that may develop before the next visit here.   ,  K, MD 01/04/2015      

## 2015-01-04 NOTE — Telephone Encounter (Signed)
Called pt to see if she was planning on coming to chemotherapy class.No answer and pt vm is full.  Informed chemoeducation nurse unable to reach the pt.

## 2015-01-04 NOTE — Telephone Encounter (Signed)
Appointments made and avs printed for patient,patient will call for her mri at [facility]

## 2015-01-04 NOTE — Progress Notes (Signed)
Patient a no show for chemo class.  Dawn, breast navigator, aware.  She will try to contact patient.

## 2015-01-04 NOTE — Progress Notes (Signed)
Note created by Dr. Gudena during office visit, copy to patient,original to scan. 

## 2015-01-08 ENCOUNTER — Ambulatory Visit (HOSPITAL_COMMUNITY)
Admission: RE | Admit: 2015-01-08 | Discharge: 2015-01-08 | Disposition: A | Discharge status: Home / Self Care | Payer: Medicare Other | Source: Ambulatory Visit | Attending: Hematology and Oncology | Admitting: Hematology and Oncology

## 2015-01-08 DIAGNOSIS — Z720 Tobacco use: Secondary | ICD-10-CM | POA: Insufficient documentation

## 2015-01-08 DIAGNOSIS — Z01810 Encounter for preprocedural cardiovascular examination: Secondary | ICD-10-CM | POA: Diagnosis not present

## 2015-01-08 DIAGNOSIS — C50412 Malignant neoplasm of upper-outer quadrant of left female breast: Secondary | ICD-10-CM | POA: Insufficient documentation

## 2015-01-08 DIAGNOSIS — I10 Essential (primary) hypertension: Secondary | ICD-10-CM | POA: Diagnosis not present

## 2015-01-08 NOTE — Progress Notes (Signed)
Echocardiogram 2D Echocardiogram has been performed.  Deborah Mcintyre 01/08/2015, 9:33 AM

## 2015-01-10 ENCOUNTER — Encounter: Payer: Self-pay | Admitting: *Deleted

## 2015-01-10 ENCOUNTER — Ambulatory Visit
Admission: RE | Admit: 2015-01-10 | Discharge: 2015-01-10 | Disposition: A | Discharge status: Home / Self Care | Payer: Medicare Other | Source: Ambulatory Visit | Attending: Hematology and Oncology | Admitting: Hematology and Oncology

## 2015-01-10 ENCOUNTER — Other Ambulatory Visit: Payer: Medicare Other

## 2015-01-10 DIAGNOSIS — C50412 Malignant neoplasm of upper-outer quadrant of left female breast: Secondary | ICD-10-CM

## 2015-01-10 MED ORDER — GADOBENATE DIMEGLUMINE 529 MG/ML IV SOLN
20.0000 mL | Freq: Once | INTRAVENOUS | Status: AC | PRN
  Administered 2015-01-10: 20 mL via INTRAVENOUS

## 2015-01-10 NOTE — Progress Notes (Signed)
[name]  Clinical Social Work was referred by patient navigator for assessment of psychosocial needs.  Clinical Social Worker contacted patient at home to offer support and assess for needs. CSW reviewed role of CSW and Pt and Family Support team. Pt very interested in additional support and resources. She is open to counseling with counseling intern and support groups. CSW to make referral to counseling intern and mail pt calendar for additional support. Pt agrees to reach out to [place] team as needed.     Clinical Social Work interventions: Resource education Supportive listening  [name], [name]  [name] Phone: [phone] Fax: [phone]

## 2015-01-11 ENCOUNTER — Other Ambulatory Visit: Payer: Self-pay | Admitting: *Deleted

## 2015-01-11 ENCOUNTER — Encounter: Payer: Self-pay | Admitting: *Deleted

## 2015-01-11 DIAGNOSIS — C50412 Malignant neoplasm of upper-outer quadrant of left female breast: Secondary | ICD-10-CM

## 2015-01-13 ENCOUNTER — Telehealth: Payer: Self-pay | Admitting: Hematology and Oncology

## 2015-01-13 NOTE — Telephone Encounter (Signed)
Called and left a message with 1/11 follow up

## 2015-01-14 ENCOUNTER — Other Ambulatory Visit: Payer: Self-pay | Admitting: Hematology and Oncology

## 2015-01-14 DIAGNOSIS — C50412 Malignant neoplasm of upper-outer quadrant of left female breast: Secondary | ICD-10-CM

## 2015-01-14 MED ORDER — PROCHLORPERAZINE MALEATE 10 MG PO TABS: 10.0000 mg | ORAL_TABLET | Freq: Four times a day (QID) | ORAL | Status: DC | PRN

## 2015-01-14 MED ORDER — LIDOCAINE-PRILOCAINE 2.5-2.5 % EX CREA: TOPICAL_CREAM | CUTANEOUS | Status: DC

## 2015-01-14 MED ORDER — LORAZEPAM 0.5 MG PO TABS: 0.5000 mg | ORAL_TABLET | Freq: Every day | ORAL | Status: DC

## 2015-01-14 MED ORDER — DEXAMETHASONE 4 MG PO TABS
4.0000 mg | ORAL_TABLET | Freq: Two times a day (BID) | ORAL | Status: DC
Start: 2015-01-14 — End: 2015-03-04

## 2015-01-14 MED ORDER — ONDANSETRON HCL 8 MG PO TABS: 8.0000 mg | ORAL_TABLET | Freq: Two times a day (BID) | ORAL | Status: DC | PRN

## 2015-01-15 ENCOUNTER — Telehealth: Payer: Self-pay | Admitting: Hematology and Oncology

## 2015-01-15 ENCOUNTER — Ambulatory Visit (HOSPITAL_COMMUNITY)
Admission: RE | Admit: 2015-01-15 | Discharge: 2015-01-15 | Disposition: A | Discharge status: Home / Self Care | Payer: Medicare Other | Source: Ambulatory Visit | Attending: Hematology and Oncology | Admitting: Hematology and Oncology

## 2015-01-15 ENCOUNTER — Encounter (HOSPITAL_COMMUNITY): Payer: Medicare Other

## 2015-01-15 ENCOUNTER — Encounter (HOSPITAL_COMMUNITY): Payer: Self-pay

## 2015-01-15 ENCOUNTER — Other Ambulatory Visit: Payer: Self-pay

## 2015-01-15 DIAGNOSIS — J449 Chronic obstructive pulmonary disease, unspecified: Secondary | ICD-10-CM | POA: Diagnosis not present

## 2015-01-15 DIAGNOSIS — C50412 Malignant neoplasm of upper-outer quadrant of left female breast: Secondary | ICD-10-CM | POA: Diagnosis present

## 2015-01-15 DIAGNOSIS — I714 Abdominal aortic aneurysm, without rupture: Secondary | ICD-10-CM | POA: Insufficient documentation

## 2015-01-15 DIAGNOSIS — R590 Localized enlarged lymph nodes: Secondary | ICD-10-CM | POA: Diagnosis not present

## 2015-01-15 DIAGNOSIS — F17200 Nicotine dependence, unspecified, uncomplicated: Secondary | ICD-10-CM | POA: Diagnosis not present

## 2015-01-15 DIAGNOSIS — I10 Essential (primary) hypertension: Secondary | ICD-10-CM | POA: Insufficient documentation

## 2015-01-15 DIAGNOSIS — Z79899 Other long term (current) drug therapy: Secondary | ICD-10-CM | POA: Diagnosis not present

## 2015-01-15 MED ORDER — IOHEXOL 300 MG/ML  SOLN
50.0000 mL | Freq: Once | INTRAMUSCULAR | Status: DC | PRN
  Administered 2015-01-15: 50 mL via ORAL
  Filled 2015-01-15: qty 50

## 2015-01-15 MED ORDER — IOHEXOL 300 MG/ML  SOLN
100.0000 mL | Freq: Once | INTRAMUSCULAR | Status: AC | PRN
  Administered 2015-01-15: 100 mL via INTRAVENOUS

## 2015-01-15 MED ORDER — TECHNETIUM TC 99M MEDRONATE IV KIT: 25.0000 | PACK | Freq: Once | INTRAVENOUS | Status: DC | PRN

## 2015-01-15 NOTE — Progress Notes (Signed)
Ativan and Dexamethason orders faxed to [facility].  Sent to scan.  POF sent

## 2015-01-15 NOTE — Telephone Encounter (Signed)
Left a message per terri with new appointments and to cancel 1/11 due to seeing her on 1/13

## 2015-01-16 ENCOUNTER — Other Ambulatory Visit: Payer: Self-pay | Admitting: Radiology

## 2015-01-16 ENCOUNTER — Other Ambulatory Visit: Payer: Self-pay | Admitting: Surgery

## 2015-01-16 ENCOUNTER — Ambulatory Visit: Payer: Medicare Other | Admitting: Hematology and Oncology

## 2015-01-16 DIAGNOSIS — R928 Other abnormal and inconclusive findings on diagnostic imaging of breast: Secondary | ICD-10-CM

## 2015-01-17 ENCOUNTER — Ambulatory Visit (HOSPITAL_COMMUNITY)
Admission: RE | Admit: 2015-01-17 | Discharge: 2015-01-17 | Disposition: A | Discharge status: Home / Self Care | Payer: Medicare Other | Source: Ambulatory Visit | Attending: Hematology and Oncology | Admitting: Hematology and Oncology

## 2015-01-17 ENCOUNTER — Other Ambulatory Visit: Payer: Self-pay | Admitting: Hematology and Oncology

## 2015-01-17 DIAGNOSIS — C50412 Malignant neoplasm of upper-outer quadrant of left female breast: Secondary | ICD-10-CM

## 2015-01-17 LAB — BASIC METABOLIC PANEL
ANION GAP: 11 (ref 5–15)
BUN: 15 mg/dL (ref 6–20)
CHLORIDE: 106 mmol/L (ref 101–111)
CO2: 26 mmol/L (ref 22–32)
CREATININE: 0.78 mg/dL (ref 0.44–1.00)
Calcium: 9.5 mg/dL (ref 8.9–10.3)
GFR calc non Af Amer: >60 mL/min (ref >60)
GLUCOSE: 105 mg/dL — AB (ref 65–99)
Potassium: 3.9 mmol/L (ref 3.5–5.1)
Sodium: 143 mmol/L (ref 135–145)

## 2015-01-17 LAB — APTT: aPTT: 28 seconds (ref 24–37)

## 2015-01-17 LAB — CBC
HCT: 40.8 % (ref 36.0–46.0)
HEMOGLOBIN: 13.1 g/dL (ref 12.0–15.0)
MCH: 31.2 pg (ref 26.0–34.0)
MCHC: 32.1 g/dL (ref 30.0–36.0)
MCV: 97.1 fL (ref 78.0–100.0)
Platelets: 243 10*3/uL (ref 150–400)
RBC: 4.2 MIL/uL (ref 3.87–5.11)
RDW: 12.7 % (ref 11.5–15.5)
WBC: 5 10*3/uL (ref 4.0–10.5)

## 2015-01-17 LAB — PROTIME-INR
INR: 0.98 (ref 0.00–1.49)
Prothrombin Time: 13.2 seconds (ref 11.6–15.2)

## 2015-01-17 MED ORDER — LIDOCAINE HCL 1 % IJ SOLN
INTRAMUSCULAR | Status: AC
  Filled 2015-01-17: qty 20

## 2015-01-17 MED ORDER — FENTANYL CITRATE (PF) 100 MCG/2ML IJ SOLN
INTRAMUSCULAR | Status: AC | PRN
  Administered 2015-01-17 (×2): 25 ug via INTRAVENOUS
  Administered 2015-01-17: 50 ug via INTRAVENOUS

## 2015-01-17 MED ORDER — LIDOCAINE-EPINEPHRINE 2 %-1:100000 IJ SOLN
INTRAMUSCULAR | Status: AC
  Filled 2015-01-17: qty 1

## 2015-01-17 MED ORDER — MIDAZOLAM HCL 2 MG/2ML IJ SOLN
INTRAMUSCULAR | Status: AC | PRN
  Administered 2015-01-17 (×2): 0.5 mg via INTRAVENOUS
  Administered 2015-01-17: 1 mg via INTRAVENOUS

## 2015-01-17 MED ORDER — CEFAZOLIN SODIUM-DEXTROSE 2-3 GM-% IV SOLR
INTRAVENOUS | Status: AC
  Filled 2015-01-17: qty 50

## 2015-01-17 MED ORDER — MIDAZOLAM HCL 2 MG/2ML IJ SOLN
INTRAMUSCULAR | Status: AC
  Filled 2015-01-17: qty 4

## 2015-01-17 MED ORDER — HEPARIN SOD (PORK) LOCK FLUSH 100 UNIT/ML IV SOLN
INTRAVENOUS | Status: AC
  Filled 2015-01-17: qty 5

## 2015-01-17 MED ORDER — HEPARIN SOD (PORK) LOCK FLUSH 100 UNIT/ML IV SOLN
INTRAVENOUS | Status: AC | PRN
  Administered 2015-01-17: 500 [IU]

## 2015-01-17 MED ORDER — SODIUM CHLORIDE 0.9 % IV SOLN
Freq: Once | INTRAVENOUS | Status: AC
  Administered 2015-01-17: 13:00:00 via INTRAVENOUS

## 2015-01-17 MED ORDER — CEFAZOLIN SODIUM-DEXTROSE 2-3 GM-% IV SOLR
2.0000 g | INTRAVENOUS | Status: AC
Start: 2015-01-17 — End: 2015-01-17
  Administered 2015-01-17: 2 g via INTRAVENOUS

## 2015-01-17 MED ORDER — FENTANYL CITRATE (PF) 100 MCG/2ML IJ SOLN
INTRAMUSCULAR | Status: AC
  Filled 2015-01-17: qty 2

## 2015-01-17 NOTE — H&P (Signed)
Chief Complaint: Patient was seen in consultation today for port a cath placement  Referring Physician(s): Gudena,Vinay  History of Present Illness: Deborah Mcintyre is a 60 y.o. female with history of recently diagnosed inflammatory left breast carcinoma who presents today for port a cath placement for chemotherapy.   Past Medical History  Diagnosis Date  . Hypertension   . Pneumonia   . Bronchitis   . Arthritis   . COPD (chronic obstructive pulmonary disease) ([place])   . Malignant neoplasm of upper-outer quadrant of left female breast ([place]) 01/04/2015    Past Surgical History  Procedure Laterality Date  . Cesarean section      Allergies: Review of patient's allergies indicates no known allergies.  Medications: Prior to Admission medications   Medication Sig Start Date End Date Taking? Authorizing Provider  albuterol (PROVENTIL) (2.5 MG/3ML) 0.083% nebulizer solution Take 3 mLs (2.5 mg total) by nebulization every 4 (four) hours as needed for wheezing or shortness of breath. 08/07/13  Yes [name], MD  ALPRAZolam Duanne Moron) 0.5 MG tablet Take 0.5 mg by mouth 3 (three) times daily as needed for anxiety or sleep.   Yes Historical Provider, MD  amLODipine (NORVASC) 5 MG tablet Take 5 mg by mouth 2 (two) times daily.   Yes Historical Provider, MD  ibuprofen (ADVIL,MOTRIN) 200 MG tablet Take 200 mg by mouth every 6 (six) hours as needed.   Yes Historical Provider, MD  metoprolol (LOPRESSOR) 50 MG tablet Take 0.5 tablets (25 mg total) by mouth 2 (two) times daily. Patient taking differently: Take 50 mg by mouth 2 (two) times daily.  08/07/13  Yes [name], MD  oxyCODONE-acetaminophen (PERCOCET/ROXICET) 5-325 MG tablet Take 1 tablet by mouth every 8 (eight) hours as needed for severe pain. 01/01/15  Yes Nicholas Lose, MD  PARoxetine (PAXIL) 20 MG tablet Take 20 mg by mouth 2 (two) times daily.    Yes Historical Provider, MD  clindamycin (CLEOCIN) 300 MG capsule Take 300 mg by  mouth 4 (four) times daily. X 7 days    Historical Provider, MD  dexamethasone (DECADRON) 4 MG tablet Take 1 tablet (4 mg total) by mouth 2 (two) times daily. Take 2 tablets by mouth once a day on the day after chemotherapy and then take 2 tablets two times a day for 2 days. Take with food. 01/14/15   Nicholas Lose, MD  HYDROcodone-acetaminophen (NORCO/VICODIN) 5-325 MG tablet Take 1-2 tablets by mouth every 4 (four) hours as needed. 12/06/14   Charlann Lange, PA-C  lidocaine-prilocaine (EMLA) cream Apply to affected area once 01/14/15   Nicholas Lose, MD  LORazepam (ATIVAN) 0.5 MG tablet Take 1 tablet (0.5 mg total) by mouth at bedtime. 01/14/15   Nicholas Lose, MD  ondansetron (ZOFRAN) 8 MG tablet Take 1 tablet (8 mg total) by mouth 2 (two) times daily as needed. Start on the third day after chemotherapy. 01/14/15   Nicholas Lose, MD  prochlorperazine (COMPAZINE) 10 MG tablet Take 1 tablet (10 mg total) by mouth every 6 (six) hours as needed (Nausea or vomiting). 01/14/15   Nicholas Lose, MD     No family history on file.  Social History   Social History  . Marital Status: Married    Spouse Name: N/A  . Number of Children: N/A  . Years of Education: N/A   Social History Main Topics  . Smoking status: Current Every Day Smoker -- 0.00 packs/day  . Smokeless tobacco: Never Used  . Alcohol Use: 0.0 oz/week  Comment: ocassionally  . Drug Use: No  . Sexual Activity: Not on file   Other Topics Concern  . Not on file   Social History Narrative     Review of Systems  Constitutional: Negative for fever and chills.  Respiratory: Negative for cough and shortness of breath.   Cardiovascular: Negative for chest pain.  Gastrointestinal: Negative for nausea, vomiting, abdominal pain and blood in stool.  Genitourinary: Negative for dysuria and hematuria.  Musculoskeletal: Negative for back pain.  Neurological: Negative for headaches.    Vital Signs: BP 149/94 mmHg  Pulse 18  Temp(Src) 98.2 F (36.8  C) (Oral)  Resp 80  Ht 5' 7.5" (1.715 m)  Wt 211 lb (95.709 kg)  BMI 32.54 kg/m2  SpO2 98%  Physical Exam  Constitutional: She is oriented to person, place, and time. She appears well-developed and well-nourished.  Cardiovascular: Normal rate and regular rhythm.   Pulmonary/Chest: Effort normal.  Distant BS bilat  Abdominal: Soft. Bowel sounds are normal. There is no tenderness.  obese  Musculoskeletal: Normal range of motion. She exhibits no edema.  Neurological: She is alert and oriented to person, place, and time.    Mallampati Score:     Imaging: Ct Chest W Contrast  01/15/2015  CLINICAL DATA:  Newly diagnosed left upper outer quadrant breast carcinoma. EXAM: CT CHEST, ABDOMEN, AND PELVIS WITH CONTRAST TECHNIQUE: Multidetector CT imaging of the chest, abdomen and pelvis was performed following the standard protocol during bolus administration of intravenous contrast. CONTRAST:  175m OMNIPAQUE IOHEXOL 300 MG/ML SOLN, 577mOMNIPAQUE IOHEXOL 300 MG/ML SOLN COMPARISON:  None. FINDINGS: CT CHEST FINDINGS Mediastinum/Lymph Nodes: No mediastinal or hilar lymphadenopathy identified. Left axillary lymphadenopathy is seen, with largest lymph node measuring 2.2 cm in short axis. Lungs/Pleura: No pulmonary mass, infiltrate, or effusion. Musculoskeletal: A spiculated mass is seen in the upper outer quadrant of the left breast measuring approximately 4 cm. There is a overlying diffuse left breast skin thickening, consistent with dermal involvement. CT ABDOMEN PELVIS FINDINGS Hepatobiliary: No masses or other significant abnormality. Gallbladder is unremarkable. Pancreas: No mass, inflammatory changes, or other significant abnormality. Spleen: Within normal limits in size and appearance. Adrenals/Urinary Tract: No masses identified. No evidence of hydronephrosis. Small benign appearing renal cysts noted bilaterally. Stomach/Bowel: No evidence of obstruction, inflammatory process, or abnormal fluid  collections. Sigmoid diverticulosis noted, without evidence of diverticulitis. Normal appendix visualized. Vascular/Lymphatic: No pathologically enlarged lymph nodes. Mild infrarenal abdominal aortic aneurysm measuring 3.0 cm. Reproductive: Uterus is normal in appearance. In the right posterior pelvis, there is a fat attenuation lesion which shows thin peripheral calcification measuring 2.7 x 3.5 cm. This appears posterior to the right ovary, suggesting possible fat necrosis, although ovarian dermoid cannot be excluded. Other: None. Musculoskeletal:  No suspicious bone lesions identified. IMPRESSION: 4 cm left upper outer quadrant breast mass consistent with known primary breast carcinoma. Diffuse left breast skin thickening is consistent with dermal involvement. Left axillary lymphadenopathy, consistent with metastatic disease. No other sites of metastatic disease identified. 3.0 cm infrarenal abdominal aortic aneurysm. Recommend followup by ultrasound in 3 years. This recommendation follows ACR consensus guidelines: White Paper of the ACR Incidental Findings Committee II on Vascular Findings. J Am Coll Radiol 2013; 10:789-794. Benign fat necrosis versus ovarian dermoid in right posterior pelvis. Electronically Signed   By: JoEarle Gell.D.   On: 01/15/2015 14:32   Nm Bone Scan Whole Body  01/15/2015  CLINICAL DATA:  Newly diagnosed left breast cancer. Patient is asymptomatic. EXAM: NUCLEAR MEDICINE  WHOLE BODY BONE SCAN TECHNIQUE: Whole body anterior and posterior images were obtained approximately 3 hours after intravenous injection of radiopharmaceutical. RADIOPHARMACEUTICALS:  Twenty-six mCi Technetium-13mMDP IV COMPARISON:  CT of the abdomen pelvis dated 01/15/2015. FINDINGS: There is physiologic radiotracer distribution within osseous structures and bilateral kidneys. Diffuse abnormal radiotracer uptake is seen within the left breast. Prominent radiotracer uptake is seen in the posterior aspect of the  right T8 vertebral body. Linear area of radiotracer uptake overlying the right kidney is likely physiologic. Mildly increased uptake seen within bilateral shoulders, and right wrist is likely related to osteoarthritic changes. IMPRESSION: Diffuse abnormal radiotracer uptake within the left breast, concerning for inflammatory left breast cancer. Radiotracer uptake in the posterior right aspect of the T8 vertebral body, with uncertain significance. No correlating abnormality was found on the recent CT of the abdomen and pelvis. 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 Electronically Signed   By: DFidela SalisburyM.D.   On: 01/15/2015 15:35   Ct Abdomen Pelvis W Contrast  01/15/2015  CLINICAL DATA:  Newly diagnosed left upper outer quadrant breast carcinoma. EXAM: CT CHEST, ABDOMEN, AND PELVIS WITH CONTRAST TECHNIQUE: Multidetector CT imaging of the chest, abdomen and pelvis was performed following the standard protocol during bolus administration of intravenous contrast. CONTRAST:  10100mOMNIPAQUE IOHEXOL 300 MG/ML SOLN, 5045mMNIPAQUE IOHEXOL 300 MG/ML SOLN COMPARISON:  None. FINDINGS: CT CHEST FINDINGS Mediastinum/Lymph Nodes: No mediastinal or hilar lymphadenopathy identified. Left axillary lymphadenopathy is seen, with largest lymph node measuring 2.2 cm in short axis. Lungs/Pleura: No pulmonary mass, infiltrate, or effusion. Musculoskeletal: A spiculated mass is seen in the upper outer quadrant of the left breast measuring approximately 4 cm. There is a overlying diffuse left breast skin thickening, consistent with dermal involvement. CT ABDOMEN PELVIS FINDINGS Hepatobiliary: No masses or other significant abnormality. Gallbladder is unremarkable. Pancreas: No mass, inflammatory changes, or other significant abnormality. Spleen: Within normal limits in size and appearance. Adrenals/Urinary Tract: No masses identified. No evidence of hydronephrosis. Small benign appearing renal cysts  noted bilaterally. Stomach/Bowel: No evidence of obstruction, inflammatory process, or abnormal fluid collections. Sigmoid diverticulosis noted, without evidence of diverticulitis. Normal appendix visualized. Vascular/Lymphatic: No pathologically enlarged lymph nodes. Mild infrarenal abdominal aortic aneurysm measuring 3.0 cm. Reproductive: Uterus is normal in appearance. In the right posterior pelvis, there is a fat attenuation lesion which shows thin peripheral calcification measuring 2.7 x 3.5 cm. This appears posterior to the right ovary, suggesting possible fat necrosis, although ovarian dermoid cannot be excluded. Other: None. Musculoskeletal:  No suspicious bone lesions identified. IMPRESSION: 4 cm left upper outer quadrant breast mass consistent with known primary breast carcinoma. Diffuse left breast skin thickening is consistent with dermal involvement. Left axillary lymphadenopathy, consistent with metastatic disease. No other sites of metastatic disease identified. 3.0 cm infrarenal abdominal aortic aneurysm. Recommend followup by ultrasound in 3 years. This recommendation follows ACR consensus guidelines: White Paper of the ACR Incidental Findings Committee II on Vascular Findings. J Am Coll Radiol 2013; 10:789-794. Benign fat necrosis versus ovarian dermoid in right posterior pelvis. Electronically Signed   By: JohEarle GellD.   On: 01/15/2015 14:32   Mr Breast Bilateral W Wo Contrast  01/11/2015  CLINICAL DATA:  Recently diagnosed left breast invasive ductal carcinoma and metastatic adenopathy. LABS:  Creatinine was obtained on site at [facility] [address] Results: Creatinine 0.9 mg/dL. EXAM: BILATERAL BREAST MRI WITH AND WITHOUT CONTRAST TECHNIQUE: Multiplanar, multisequence MR images of both breasts were obtained  prior to and following the intravenous administration of 20 ml of MultiHance. THREE-DIMENSIONAL MR IMAGE RENDERING ON INDEPENDENT WORKSTATION: Three-dimensional MR  images were rendered by post-processing of the original MR data on an independent workstation. The three-dimensional MR images were interpreted, and findings are reported in the following complete MRI report for this study. Three dimensional images were evaluated at the independent DynaCad workstation COMPARISON:  Previous mammogram, ultrasound and biopsy examinations. FINDINGS: Breast composition: b. Scattered fibroglandular tissue. Background parenchymal enhancement: Mild. Right breast: There is an irregular, enhancing mass with plateau enhancement kinetics in the central right breast, measuring 1.0 x 0.9 x 0.7 cm. There is also a 2.5 x 1.4 x 0.8 cm group of 3 small, rounded, enhancing masses with mildly ill-defined margins and a mixture of enhancement kinetics, including rapid wash-in/washout, in the posterior right breast slightly inferiorly and slightly laterally. There are multiple additional smaller, rounded, nodular areas of enhancement in the right breast. Left breast: There is a 5.6 x 4.0 x 3.5 cm irregular, spiculated mass in the posterior aspect of the upper-outer quadrant of the left breast with a mixture of enhancement kinetics, including rapid wash-in/washout. There is a biopsy marker clip within the anterior aspect of this mass. This corresponds to the recently biopsied invasive ductal carcinoma. More anteriorly in the upper-outer quadrant of the left breast, there is a 2.3 x 2.1 x 2.1 cm irregular, enhancing mass with similar enhancement kinetics. There are multiple additional small, rounded, nodular enhancing masses between and anterior, medial, lateral and superior to these masses, spanning an area measuring 11.8 x 10.5 x 5.6 cm extending from the nipple to the posterior aspect of the breast. This is involving all 4 quadrants of the breast anteriorly and the upper inner and outer quadrants of the breast posteriorly. There is diffuse edema in the left breast with diffuse skin thickening without  abnormal skin enhancement. There is anterior tethering of the pectoralis major muscle without abnormal enhancement within the muscle. None of the masses are in contact with the muscle. Lymph nodes: There are multiple abnormally enlarged left axillary lymph nodes, including retropectoral nodes. The largest retropectoral node has a short axis diameter of 10 mm on inversion recovery image number 57. There is also an enlarged internal mammary lymph node with a short axis diameter of 7 mm on image 28 of series 7. There are additional small, mildly enlarged left internal mammary lymph nodes, including 2 adjacent lymph nodes with a short axis diameter of 7 mm on image number 43 of series 7. There are no enlarged right axillary or right internal mammary lymph nodes. Ancillary findings:  None. IMPRESSION: 1. 5.6 x 4.0 x 3.5 cm biopsy-proven invasive ductal carcinoma in the posterior aspect of the upper-outer quadrant of the left breast. 2. 2.3 x 2.1 x 2.1 cm irregular, enhancing mass with similar enhancement kinetics in the anterior aspect of the upper-outer quadrant of the left breast. 3. Multiple additional small, rounded, enhancing masses in the anterior 2/3 of the left breast extending from the nipple to the posterior aspect of the biopsied malignancy. This involves all 4 quadrants anteriorly and the upper outer and upper inner quadrants posteriorly. This area measures 11.8 x 10.5 x 5.6 cm and is compatible with additional multifocal malignancy. 4. Diffuse edema throughout the left breast with associated diffuse skin thickening. This is most likely due to a lymphatic obstruction by the malignancy in the breast. Dermal invasion cannot be excluded. 5. Extensive left axillary metastatic adenopathy, including retropectoral adenopathy. 6.  Multiple mildly enlarged left internal mammary lymph nodes, suspicious for metastatic adenopathy. 7. 1.0 x 0.9 x 0.7 cm irregular enhancing mass in the central right breast with imaging  features suspicious for malignancy. 8. 2.5 x 1.4 x 0.8 cm group of 3 small, rounded enhancing masses in the lower outer quadrant of the right breast, suspicious for malignancy. RECOMMENDATION: MR guided core needle biopsy of the 1.0 cm irregular enhancing mass in the central right breast and 2.5 cm group of enhancing masses in the lower outer quadrant of the right breast. BI-RADS CATEGORY  Category 4C: High suspicion for malignancy. Electronically Signed   By: Claudie Revering M.D.   On: 01/11/2015 14:34   Mm Digital Diagnostic Unilat L  12/26/2014  CLINICAL DATA:  Confirmation of clip placement after core needle biopsy of an approximate 4.5 cm mass in the upper outer quadrant of the left breast and core needle biopsy of a pathologic left axillary lymph node performed earlier today. EXAM: DIAGNOSTIC LEFT MAMMOGRAM POST ULTRASOUND BIOPSY COMPARISON:  Previous exam(s). FINDINGS: Mammographic images were obtained following ultrasound guided biopsy of a large mass in the upper outer quadrant of the left breast and a large pathologic left axillary lymph node. The ribbon shaped tissue marker clip is appropriately positioned at the anterior margin of the large spiculated mass in the upper outer quadrant. The spiral [facility] tissue marker clip is appropriately positioned within the large pathologic left axillary lymph node. Expected post biopsy changes are present without evidence of hematoma. IMPRESSION: Appropriate positioning of the ribbon shaped tissue marker clip within the anterior margin of the spiculated mass in the upper outer quadrant of the left breast. Appropriate positioning of the spiral [facility] tissue marker clip within the large pathologic left axillary lymph node. Final Assessment: Post Procedure Mammograms for Marker Placement Electronically Signed   By: Evangeline Dakin M.D.   On: 12/26/2014 17:07   US Breast Ltd Uni Left Inc Axilla  12/19/2014  CLINICAL DATA:  Patient recently notes a palpable  tender left breast mass. There is a family history of breast cancer in a maternal aunt. EXAM: DIGITAL DIAGNOSTIC BILATERAL MAMMOGRAM WITH 3D TOMOSYNTHESIS WITH CAD ULTRASOUND LEFT BREAST COMPARISON:  None ACR Breast Density Category b: There are scattered areas of fibroglandular density. FINDINGS: There is an ill-defined mass with spiculation located within the upper outer quadrant of the left breast which corresponds to the palpable abnormality. By mammography this measures 4.8 cm in size. In addition, there is diffuse increased trabecular markings throughout the left breast and diffuse skin thickening associated with the left breast worrisome for possible dermal tumor involvement from invasive mammary carcinoma. There is an enlarged left axillary lymph node present. The right breast is unremarkable. Mammographic images were processed with CAD. On physical exam, there is a firm palpable mass located within the left breast at 2 o'clock position 8 cm from the nipple. By physical examination this 4.5 in size. In addition, there is palpable left axillary adenopathy present. There is peau d'orange associated with the skin of the left breast. Targeted ultrasound is performed, showing an irregular hypoechoic mass located within the left breast at 2 o'clock position 8 cm from the nipple measuring 4.0 x 3.8 x 2.5 cm in size. This corresponds to the palpable abnormality. In addition, there is an enlarged left axillary lymph node which measures 4 cm in size. This is associated with loss of the normal fatty hilum and is worrisome for a metastatic lymph node. There is diffuse skin thickening  present. IMPRESSION: 1. 4 cm irregular mass located within the left breast at 2 o'clock position 8 cm from the nipple consistent with invasive mammary carcinoma. This is associated with an enlarged left axillary lymph node worrisome for metastatic adenopathy and diffuse skin thickening worrisome for dermal involvement. RECOMMENDATION: 1.  Left breast ultrasound-guided core biopsies of the mass located within the left breast at the 2 o'clock position and enlarged left axillary lymph node. 2. Left breast skin punch biopsy. I have discussed the findings and recommendations with the patient. Results were also provided in writing at the conclusion of the visit. If applicable, a reminder letter will be sent to the patient regarding the next appointment. BI-RADS CATEGORY  5: Highly suggestive of malignancy. Electronically Signed   By: Altamese Cabal M.D.   On: 12/19/2014 14:42   Mm Diag Breast Tomo Bilateral  12/19/2014  CLINICAL DATA:  Patient recently notes a palpable tender left breast mass. There is a family history of breast cancer in a maternal aunt. EXAM: DIGITAL DIAGNOSTIC BILATERAL MAMMOGRAM WITH 3D TOMOSYNTHESIS WITH CAD ULTRASOUND LEFT BREAST COMPARISON:  None ACR Breast Density Category b: There are scattered areas of fibroglandular density. FINDINGS: There is an ill-defined mass with spiculation located within the upper outer quadrant of the left breast which corresponds to the palpable abnormality. By mammography this measures 4.8 cm in size. In addition, there is diffuse increased trabecular markings throughout the left breast and diffuse skin thickening associated with the left breast worrisome for possible dermal tumor involvement from invasive mammary carcinoma. There is an enlarged left axillary lymph node present. The right breast is unremarkable. Mammographic images were processed with CAD. On physical exam, there is a firm palpable mass located within the left breast at 2 o'clock position 8 cm from the nipple. By physical examination this 4.5 in size. In addition, there is palpable left axillary adenopathy present. There is peau d'orange associated with the skin of the left breast. Targeted ultrasound is performed, showing an irregular hypoechoic mass located within the left breast at 2 o'clock position 8 cm from the nipple  measuring 4.0 x 3.8 x 2.5 cm in size. This corresponds to the palpable abnormality. In addition, there is an enlarged left axillary lymph node which measures 4 cm in size. This is associated with loss of the normal fatty hilum and is worrisome for a metastatic lymph node. There is diffuse skin thickening present. IMPRESSION: 1. 4 cm irregular mass located within the left breast at 2 o'clock position 8 cm from the nipple consistent with invasive mammary carcinoma. This is associated with an enlarged left axillary lymph node worrisome for metastatic adenopathy and diffuse skin thickening worrisome for dermal involvement. RECOMMENDATION: 1. Left breast ultrasound-guided core biopsies of the mass located within the left breast at the 2 o'clock position and enlarged left axillary lymph node. 2. Left breast skin punch biopsy. I have discussed the findings and recommendations with the patient. Results were also provided in writing at the conclusion of the visit. If applicable, a reminder letter will be sent to the patient regarding the next appointment. BI-RADS CATEGORY  5: Highly suggestive of malignancy. Electronically Signed   By: Altamese Cabal M.D.   On: 12/19/2014 14:42   [place] Lt Breast Bx W Loc Dev 1st Lesion Img Bx Spec US Guide  12/27/2014  ADDENDUM REPORT: 12/27/2014 11:14 ADDENDUM: Pathology revealed GRADE III INVASIVE DUCTAL CARCINOMA WITH LYMPHOVASCULAR INVASION in the left breast and METASTATIC CARCINOMA in a left axillary lymph node.  This was found to be concordant by Dr. Evangeline Dakin. Pathology was discussed with the patient by telephone. She reported doing well after the biopsy with tenderness at the sites. Post biopsy instructions and care were reviewed and her questions were answered. Dr. Merrilee Jansky office has made appointments for the patient with Dr. Nicholas Lose at the [facility] on January 01, 2015 and Dr. Jackolyn Confer at [facility] on January 10, 2015. The patient is aware of these appointments. My number was provided for additional questions and concerns. Pathology results reported by Susa Raring RN, BSN on December 27, 2014. Electronically Signed   By: Evangeline Dakin M.D.   On: 12/27/2014 11:14  12/27/2014  CLINICAL DATA:  60 year old presenting with a palpable approximate 4.5 cm mass in the upper outer quadrant of the left breast at the 2 o'clock position approximately 8 cm from the nipple, associated with skin edema/inflammation (peau d'orange) and trabecular thickening on diagnostic workup. Pathologic approximate 4 cm level 2 left axillary lymph node identified at ultrasound. EXAM: ULTRASOUND GUIDED LEFT BREAST CORE NEEDLE BIOPSY COMPARISON:  Previous exam(s). FINDINGS: I met with the patient and we discussed the procedure of ultrasound-guided biopsy, including benefits and alternatives. We discussed the high likelihood of a successful procedure. We discussed the risks of the procedure, including infection, bleeding, tissue injury, clip migration, and inadequate sampling. Informed written consent was given. The usual time-out protocol was performed immediately prior to the procedure. Using sterile technique with chlorhexidine has skin antisepsis, 1% as local anesthetic, under direct ultrasound visualization, a 12 gauge Bard Marquee core needle device was used to perform biopsy of the large mass in the upper inner quadrant of the left breast using an inferior approach. At the conclusion of the procedure a ribbon shaped tissue marker clip was deployed into the biopsy cavity. Follow up 2 view mammogram was performed and dictated separately. IMPRESSION: Ultrasound guided biopsy of the suspicious 4.5 cm mass in the upper outer quadrant of the left breast. No apparent complications. Electronically Signed: By: Evangeline Dakin M.D. On: 12/26/2014 17:04   [place] Lt Breast Bx W Loc Dev Ea Add Lesion Img Bx Spec US Guide  12/26/2014  CLINICAL DATA:   60 year old presenting with a palpable approximate 4.5 cm mass in the upper outer quadrant of the left breast at the 2 o'clock position approximately 8 cm from the nipple, associated with skin edema/inflammation (peau d'orange) and trabecular thickening on diagnostic workup. Pathologic approximate 4 cm level 2 left axillary lymph node identified at ultrasound. EXAM: ULTRASOUND GUIDED CORE NEEDLE BIOPSY OF A LEFT AXILLARY NODE COMPARISON:  Previous exam(s). FINDINGS: I met with the patient and we discussed the procedure of ultrasound-guided biopsy, including benefits and alternatives. We discussed the high likelihood of a successful procedure. We discussed the risks of the procedure, including infection, bleeding, tissue injury, clip migration, and inadequate sampling. Informed written consent was given. The usual time-out protocol was performed immediately prior to the procedure. Using sterile technique with chlorhexidine and skin antisepsis, 1% as local anesthetic, under direct ultrasound visualization, a 14 gauge Bard Marquee core needle device was used to perform biopsy of the pathologic 4 cm level 2 left axillary lymph node using an inferior approach. At the conclusion of the procedure a spiral Hydromark tissue marker clip was deployed into the biopsy cavity. Follow up 2 view mammogram was performed and dictated separately. IMPRESSION: Ultrasound guided biopsy of a pathologic left axillary lymph node. No apparent complications. Electronically Signed  By: Evangeline Dakin M.D.   On: 12/26/2014 17:05    Labs:  CBC:  Recent Labs  01/17/15 1228  WBC 5.0  HGB 13.1  HCT 40.8  PLT 243    COAGS:  Recent Labs  01/17/15 1228  INR 0.98  APTT 28    BMP:  Recent Labs  01/17/15 1228  NA 143  K 3.9  CL 106  CO2 26  GLUCOSE 105*  BUN 15  CALCIUM 9.5  CREATININE 0.78  GFRNONAA >60  GFRAA >60    LIVER FUNCTION TESTS: No results for input(s): BILITOT, AST, ALT, ALKPHOS, PROT, ALBUMIN in  the last 8760 hours.  TUMOR MARKERS: No results for input(s): AFPTM, CEA, CA199, CHROMGRNA in the last 8760 hours.  Assessment and Plan: 60 y.o. female with history of recently diagnosed inflammatory left breast carcinoma who presents today for port a cath placement for chemotherapy. Risks and benefits discussed with the patient including, but not limited to bleeding, infection, pneumothorax, or fibrin sheath development and need for additional procedures.All of the patient's questions were answered, patient is agreeable to proceed.Consent signed and in chart.      Thank you for this interesting consult.  I greatly enjoyed meeting Deborah Mcintyre and look forward to participating in their care.  A copy of this report was sent to the requesting provider on this date.  Electronically Signed: D. Rowe Robert 01/17/2015, 2:06 PM   I spent a total of 15 minutes in face to face in clinical consultation, greater than 50% of which was counseling/coordinating care for port a cath placement

## 2015-01-17 NOTE — Discharge Instructions (Signed)
Implanted Port Insertion, Care After °Refer to this sheet in the next few weeks. These instructions provide you with information on caring for yourself after your procedure. Your health care provider may also give you more specific instructions. Your treatment has been planned according to current medical practices, but problems sometimes occur. Call your health care provider if you have any problems or questions after your procedure. °WHAT TO EXPECT AFTER THE PROCEDURE °After your procedure, it is typical to have the following:  °· Discomfort at the port insertion site. Ice packs to the area will help. °· Bruising on the skin over the port. This will subside in 3-4 days. °HOME CARE INSTRUCTIONS °· After your port is placed, you will get a manufacturer's information card. The card has information about your port. Keep this card with you at all times.   °· Know what kind of port you have. There are many types of ports available.   °· Wear a medical alert bracelet in case of an emergency. This can help alert health care workers that you have a port.   °· The port can stay in for as long as your health care provider believes it is necessary.   °· A home health care nurse may give medicines and take care of the port.   °· You or a family member can get special training and directions for giving medicine and taking care of the port at home.   °SEEK MEDICAL CARE IF:  °· Your port does not flush or you are unable to get a blood return.   °· You have a fever or chills. °SEEK IMMEDIATE MEDICAL CARE IF: °· You have new fluid or pus coming from your incision.   °· You notice a bad smell coming from your incision site.   °· You have swelling, pain, or more redness at the incision or port site.   °· You have chest pain or shortness of breath. °  °This information is not intended to replace advice given to you by your health care provider. Make sure you discuss any questions you have with your health care provider. °  °Document  Released: 10/12/2012 Document Revised: 12/27/2012 Document Reviewed: 10/12/2012 °Elsevier Interactive Patient Education ©2016 [employer] °Implanted Port Home Guide °An implanted port is a type of central line that is placed under the skin. Central lines are used to provide IV access when treatment or nutrition needs to be given through a person's veins. Implanted ports are used for long-term IV access. An implanted port may be placed because:  °· You need IV medicine that would be irritating to the small veins in your hands or arms.   °· You need long-term IV medicines, such as antibiotics.   °· You need IV nutrition for a long period.   °· You need frequent blood draws for lab tests.   °· You need dialysis.   °Implanted ports are usually placed in the chest area, but they can also be placed in the upper arm, the abdomen, or the leg. An implanted port has two main parts:  °· Reservoir. The reservoir is round and will appear as a small, raised area under your skin. The reservoir is the part where a needle is inserted to give medicines or draw blood.   °· Catheter. The catheter is a thin, flexible tube that extends from the reservoir. The catheter is placed into a large vein. Medicine that is inserted into the reservoir goes into the catheter and then into the vein.   °HOW WILL I CARE FOR MY INCISION SITE? °Do not get the   incision site wet. Bathe or shower as directed by your health care provider.  °HOW IS MY PORT ACCESSED? °Special steps must be taken to access the port:  °· Before the port is accessed, a numbing cream can be placed on the skin. This helps numb the skin over the port site.   °· Your health care provider uses a sterile technique to access the port. °· Your health care provider must put on a mask and sterile gloves. °· The skin over your port is cleaned carefully with an antiseptic and allowed to dry. °· The port is gently pinched between sterile gloves, and a needle is inserted into the  port. °· Only "non-coring" port needles should be used to access the port. Once the port is accessed, a blood return should be checked. This helps ensure that the port is in the vein and is not clogged.   °· If your port needs to remain accessed for a constant infusion, a clear (transparent) bandage will be placed over the needle site. The bandage and needle will need to be changed every week, or as directed by your health care provider.   °· Keep the bandage covering the needle clean and dry. Do not get it wet. Follow your health care provider's instructions on how to take a shower or bath while the port is accessed.   °· If your port does not need to stay accessed, no bandage is needed over the port.   °WHAT IS FLUSHING? °Flushing helps keep the port from getting clogged. Follow your health care provider's instructions on how and when to flush the port. Ports are usually flushed with saline solution or a medicine called heparin. The need for flushing will depend on how the port is used.  °· If the port is used for intermittent medicines or blood draws, the port will need to be flushed:   °· After medicines have been given.   °· After blood has been drawn.   °· As part of routine maintenance.   °· If a constant infusion is running, the port may not need to be flushed.   °HOW LONG WILL MY PORT STAY IMPLANTED? °The port can stay in for as long as your health care provider thinks it is needed. When it is time for the port to come out, surgery will be done to remove it. The procedure is similar to the one performed when the port was put in.  °WHEN SHOULD I SEEK IMMEDIATE MEDICAL CARE? °When you have an implanted port, you should seek immediate medical care if:  °· You notice a bad smell coming from the incision site.   °· You have swelling, redness, or drainage at the incision site.   °· You have more swelling or pain at the port site or the surrounding area.   °· You have a fever that is not controlled with  medicine. °  °This information is not intended to replace advice given to you by your health care provider. Make sure you discuss any questions you have with your health care provider. °  °Document Released: 12/22/2004 Document Revised: 10/12/2012 Document Reviewed: 08/29/2012 °Elsevier Interactive Patient Education ©2016 [employer] °Moderate Conscious Sedation, Adult °Sedation is the use of medicines to promote relaxation and relieve discomfort and anxiety. Moderate conscious sedation is a type of sedation. Under moderate conscious sedation you are less alert than normal but are still able to respond to instructions or stimulation. Moderate conscious sedation is used during short medical and dental procedures. It is milder than deep sedation or general anesthesia and   allows you to return to your regular activities sooner. LET [facility] CARE PROVIDER KNOW ABOUT:   Any allergies you have.  All medicines you are taking, including vitamins, herbs, eye drops, creams, and over-the-counter medicines.  Use of steroids (by mouth or creams).  Previous problems you or members of your family have had with the use of anesthetics.  Any blood disorders you have.  Previous surgeries you have had.  Medical conditions you have.  Possibility of pregnancy, if this applies.  Use of cigarettes, alcohol, or illegal drugs. RISKS AND COMPLICATIONS Generally, this is a safe procedure. However, as with any procedure, problems can occur. Possible problems include:  Oversedation.  Trouble breathing on your own. You may need to have a breathing tube until you are awake and breathing on your own.  Allergic reaction to any of the medicines used for the procedure. BEFORE THE PROCEDURE  You may have blood tests done. These tests can help show how well your kidneys and liver are working. They can also show how well your blood clots.  A physical exam will be done.  Only take medicines as directed by your  health care provider. You may need to stop taking medicines (such as blood thinners, aspirin, or nonsteroidal anti-inflammatory drugs) before the procedure.   Do not eat or drink at least 6 hours before the procedure or as directed by your health care provider.  Arrange for a responsible adult, family member, or friend to take you home after the procedure. He or she should stay with you for at least 24 hours after the procedure, until the medicine has worn off. PROCEDURE   An intravenous (IV) catheter will be inserted into one of your veins. Medicine will be able to flow directly into your body through this catheter. You may be given medicine through this tube to help prevent pain and help you relax.  The medical or dental procedure will be done. AFTER THE PROCEDURE  You will stay in a recovery area until the medicine has worn off. Your blood pressure and pulse will be checked.   Depending on the procedure you had, you may be allowed to go home when you can tolerate liquids and your pain is under control.   This information is not intended to replace advice given to you by your health care provider. Make sure you discuss any questions you have with your health care provider.   Document Released: 09/16/2000 Document Revised: 01/12/2014 Document Reviewed: 08/29/2012 Elsevier Interactive Patient Education [employer].

## 2015-01-17 NOTE — Procedures (Signed)
Interventional Radiology Procedure Note  Procedure: Placement of a right IJ approach single lumen PowerPort.  Tip is positioned at the superior cavoatrial junction and catheter is ready for immediate use.  Complications: No immediate Recommendations:  - Ok to shower tomorrow - Do not submerge for 7 days - Routine line care   Signed,  Heath K. McCullough, MD   

## 2015-01-17 NOTE — Assessment & Plan Note (Signed)
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  Treatment Plan: 1. Neoadjuvant chemotherapy with dose dense Adriamycin and Cytoxan 4 followed by Taxol and carboplatin 12 weekly 2. Followed by Mastectomy 3. Followed by Adjuvant XRT Patient wlll need Genetic counseling  Ct scans and bone scans: do not show evidence of metastatic disease  RTC in1 week for tox check

## 2015-01-18 ENCOUNTER — Ambulatory Visit (HOSPITAL_BASED_OUTPATIENT_CLINIC_OR_DEPARTMENT_OTHER): Payer: Medicare Other | Admitting: Hematology and Oncology

## 2015-01-18 ENCOUNTER — Ambulatory Visit: Payer: Medicare Other | Admitting: Hematology and Oncology

## 2015-01-18 ENCOUNTER — Encounter: Payer: Self-pay | Admitting: Hematology and Oncology

## 2015-01-18 ENCOUNTER — Encounter: Payer: Self-pay | Admitting: *Deleted

## 2015-01-18 ENCOUNTER — Ambulatory Visit (HOSPITAL_BASED_OUTPATIENT_CLINIC_OR_DEPARTMENT_OTHER): Payer: Medicare Other

## 2015-01-18 ENCOUNTER — Other Ambulatory Visit (HOSPITAL_BASED_OUTPATIENT_CLINIC_OR_DEPARTMENT_OTHER): Payer: Medicare Other

## 2015-01-18 VITALS — BP 110/69 | HR 67 | Temp 98.2°F | Resp 18 | Ht 67.5 in | Wt 211.1 lb

## 2015-01-18 DIAGNOSIS — C50412 Malignant neoplasm of upper-outer quadrant of left female breast: Secondary | ICD-10-CM

## 2015-01-18 DIAGNOSIS — C773 Secondary and unspecified malignant neoplasm of axilla and upper limb lymph nodes: Secondary | ICD-10-CM

## 2015-01-18 DIAGNOSIS — Z5111 Encounter for antineoplastic chemotherapy: Secondary | ICD-10-CM | POA: Diagnosis not present

## 2015-01-18 DIAGNOSIS — G893 Neoplasm related pain (acute) (chronic): Secondary | ICD-10-CM | POA: Diagnosis not present

## 2015-01-18 LAB — COMPREHENSIVE METABOLIC PANEL
ALBUMIN: 3.9 g/dL (ref 3.5–5.0)
ALK PHOS: 66 U/L (ref 40–150)
ALT: 14 U/L (ref 0–55)
ANION GAP: 8 meq/L (ref 3–11)
AST: 21 U/L (ref 5–34)
BUN: 13.6 mg/dL (ref 7.0–26.0)
CALCIUM: 9 mg/dL (ref 8.4–10.4)
CO2: 26 mEq/L (ref 22–29)
Chloride: 108 mEq/L (ref 98–109)
Creatinine: 0.8 mg/dL (ref 0.6–1.1)
EGFR: 89 mL/min/{1.73_m2} — AB (ref >90)
Glucose: 105 mg/dl (ref 70–140)
POTASSIUM: 4.2 meq/L (ref 3.5–5.1)
Sodium: 141 mEq/L (ref 136–145)
Total Bilirubin: <0.3 mg/dL (ref 0.20–1.20)
Total Protein: 7.1 g/dL (ref 6.4–8.3)

## 2015-01-18 LAB — CBC WITH DIFFERENTIAL/PLATELET
BASO%: 0.4 % (ref 0.0–2.0)
BASOS ABS: 0 10*3/uL (ref 0.0–0.1)
EOS%: 3.2 % (ref 0.0–7.0)
Eosinophils Absolute: 0.2 10*3/uL (ref 0.0–0.5)
HEMATOCRIT: 41.4 % (ref 34.8–46.6)
HEMOGLOBIN: 13.8 g/dL (ref 11.6–15.9)
LYMPH#: 1.8 10*3/uL (ref 0.9–3.3)
LYMPH%: 34.6 % (ref 14.0–49.7)
MCH: 32.2 pg (ref 25.1–34.0)
MCHC: 33.3 g/dL (ref 31.5–36.0)
MCV: 96.7 fL (ref 79.5–101.0)
MONO#: 0.5 10*3/uL (ref 0.1–0.9)
MONO%: 10.2 % (ref 0.0–14.0)
NEUT#: 2.8 10*3/uL (ref 1.5–6.5)
NEUT%: 51.6 % (ref 38.4–76.8)
PLATELETS: 249 10*3/uL (ref 145–400)
RBC: 4.28 10*6/uL (ref 3.70–5.45)
RDW: 12.8 % (ref 11.2–14.5)
WBC: 5.3 10*3/uL (ref 3.9–10.3)
nRBC: 0 % (ref 0–0)

## 2015-01-18 MED ORDER — PALONOSETRON HCL INJECTION 0.25 MG/5ML
0.2500 mg | Freq: Once | INTRAVENOUS | Status: AC
  Administered 2015-01-18: 0.25 mg via INTRAVENOUS

## 2015-01-18 MED ORDER — SODIUM CHLORIDE 0.9 % IV SOLN
Freq: Once | INTRAVENOUS | Status: AC
  Administered 2015-01-18: 12:00:00 via INTRAVENOUS

## 2015-01-18 MED ORDER — DOXORUBICIN HCL CHEMO IV INJECTION 2 MG/ML
60.0000 mg/m2 | Freq: Once | INTRAVENOUS | Status: AC
  Administered 2015-01-18: 128 mg via INTRAVENOUS
  Filled 2015-01-18: qty 64

## 2015-01-18 MED ORDER — SODIUM CHLORIDE 0.9 % IV SOLN
Freq: Once | INTRAVENOUS | Status: AC
  Administered 2015-01-18: 12:00:00 via INTRAVENOUS
  Filled 2015-01-18: qty 5

## 2015-01-18 MED ORDER — PEGFILGRASTIM 6 MG/0.6ML ~~LOC~~ PSKT
6.0000 mg | PREFILLED_SYRINGE | Freq: Once | SUBCUTANEOUS | Status: DC
  Filled 2015-01-18: qty 0.6

## 2015-01-18 MED ORDER — PEGFILGRASTIM 6 MG/0.6ML ~~LOC~~ PSKT
6.0000 mg | PREFILLED_SYRINGE | Freq: Once | SUBCUTANEOUS | Status: AC
  Administered 2015-01-18: 6 mg via SUBCUTANEOUS
  Filled 2015-01-18: qty 0.6

## 2015-01-18 MED ORDER — OXYCODONE-ACETAMINOPHEN 5-325 MG PO TABS: 1.0000 | ORAL_TABLET | Freq: Three times a day (TID) | ORAL | Status: DC | PRN

## 2015-01-18 MED ORDER — SODIUM CHLORIDE 0.9 % IJ SOLN
10.0000 mL | INTRAMUSCULAR | Status: DC | PRN
  Administered 2015-01-18: 10 mL
  Filled 2015-01-18: qty 10

## 2015-01-18 MED ORDER — HEPARIN SOD (PORK) LOCK FLUSH 100 UNIT/ML IV SOLN
500.0000 [IU] | Freq: Once | INTRAVENOUS | Status: AC | PRN
  Administered 2015-01-18: 500 [IU]
  Filled 2015-01-18: qty 5

## 2015-01-18 MED ORDER — PALONOSETRON HCL INJECTION 0.25 MG/5ML
INTRAVENOUS | Status: AC
  Filled 2015-01-18: qty 5

## 2015-01-18 MED ORDER — CYCLOPHOSPHAMIDE CHEMO INJECTION 1 GM
600.0000 mg/m2 | Freq: Once | INTRAMUSCULAR | Status: AC
  Administered 2015-01-18: 1280 mg via INTRAVENOUS
  Filled 2015-01-18: qty 64

## 2015-01-18 NOTE — Progress Notes (Signed)
Face to face assessment performed.  Pt reports no problems.  Consent signed and documented.  Port tip placement documented.  Pt provided ice to numb port site.

## 2015-01-18 NOTE — Patient Instructions (Addendum)
[facility] Discharge Instructions for Patients Receiving Chemotherapy  Today you received the following chemotherapy agents Adriamycin and Cytoxan.  To help prevent nausea and vomiting after your treatment, we encourage you to take your nausea medication as directed.  If you develop nausea and vomiting that is not controlled by your nausea medication, call the clinic.   BELOW ARE SYMPTOMS THAT SHOULD BE REPORTED IMMEDIATELY:  *FEVER GREATER THAN 100.5 F  *CHILLS WITH OR WITHOUT FEVER  NAUSEA AND VOMITING THAT IS NOT CONTROLLED WITH YOUR NAUSEA MEDICATION  *UNUSUAL SHORTNESS OF BREATH  *UNUSUAL BRUISING OR BLEEDING  TENDERNESS IN MOUTH AND THROAT WITH OR WITHOUT PRESENCE OF ULCERS  *URINARY PROBLEMS  *BOWEL PROBLEMS  UNUSUAL RASH Items with * indicate a potential emergency and should be followed up as soon as possible.  Feel free to call the clinic you have any questions or concerns. The clinic phone number is [phone].  Please show the [facility] at check-in to the Emergency Department and triage nurse.    Doxorubicin injection What is this medicine? DOXORUBICIN (dox oh ROO bi sin) is a chemotherapy drug. It is used to treat many kinds of cancer like Hodgkin's disease, leukemia, non-Hodgkin's lymphoma, neuroblastoma, sarcoma, and Wilms' tumor. It is also used to treat bladder cancer, breast cancer, lung cancer, ovarian cancer, stomach cancer, and thyroid cancer. This medicine may be used for other purposes; ask your health care provider or pharmacist if you have questions. What should I tell my health care provider before I take this medicine? They need to know if you have any of these conditions: -blood disorders -heart disease, recent heart attack -infection (especially a virus infection such as chickenpox, cold sores, or herpes) -irregular heartbeat -liver disease -recent or ongoing radiation therapy -an unusual or allergic reaction to  doxorubicin, other chemotherapy agents, other medicines, foods, dyes, or preservatives -pregnant or trying to get pregnant -breast-feeding How should I use this medicine? This drug is given as an infusion into a vein. It is administered in a hospital or clinic by a specially trained health care professional. If you have pain, swelling, burning or any unusual feeling around the site of your injection, tell your health care professional right away. Talk to your pediatrician regarding the use of this medicine in children. Special care may be needed. Overdosage: If you think you have taken too much of this medicine contact a poison control center or emergency room at once. NOTE: This medicine is only for you. Do not share this medicine with others. What if I miss a dose? It is important not to miss your dose. Call your doctor or health care professional if you are unable to keep an appointment. What may interact with this medicine? Do not take this medicine with any of the following medications: -cisapride -droperidol -halofantrine -pimozide -zidovudine This medicine may also interact with the following medications: -chloroquine -chlorpromazine -clarithromycin -cyclophosphamide -cyclosporine -erythromycin -medicines for depression, anxiety, or psychotic disturbances -medicines for irregular heart beat like amiodarone, bepridil, dofetilide, encainide, flecainide, propafenone, quinidine -medicines for seizures like ethotoin, fosphenytoin, phenytoin -medicines for nausea, vomiting like dolasetron, ondansetron, palonosetron -medicines to increase blood counts like filgrastim, pegfilgrastim, sargramostim -methadone -methotrexate -pentamidine -progesterone -vaccines -verapamil Talk to your doctor or health care professional before taking any of these medicines: -acetaminophen -aspirin -ibuprofen -ketoprofen -naproxen This list may not describe all possible interactions. Give your  health care provider a list of all the medicines, herbs, non-prescription drugs, or dietary supplements you use. Also  tell them if you smoke, drink alcohol, or use illegal drugs. Some items may interact with your medicine. What should I watch for while using this medicine? Your condition will be monitored carefully while you are receiving this medicine. You will need important blood work done while you are taking this medicine. This drug may make you feel generally unwell. This is not uncommon, as chemotherapy can affect healthy cells as well as cancer cells. Report any side effects. Continue your course of treatment even though you feel ill unless your doctor tells you to stop. Your urine may turn red for a few days after your dose. This is not blood. If your urine is dark or brown, call your doctor. In some cases, you may be given additional medicines to help with side effects. Follow all directions for their use. Call your doctor or health care professional for advice if you get a fever, chills or sore throat, or other symptoms of a cold or flu. Do not treat yourself. This drug decreases your body's ability to fight infections. Try to avoid being around people who are sick. This medicine may increase your risk to bruise or bleed. Call your doctor or health care professional if you notice any unusual bleeding. Be careful brushing and flossing your teeth or using a toothpick because you may get an infection or bleed more easily. If you have any dental work done, tell your dentist you are receiving this medicine. Avoid taking products that contain aspirin, acetaminophen, ibuprofen, naproxen, or ketoprofen unless instructed by your doctor. These medicines may hide a fever. Men and women of childbearing age should use effective birth control methods while using taking this medicine. Do not become pregnant while taking this medicine. There is a potential for serious side effects to an unborn child. Talk to  your health care professional or pharmacist for more information. Do not breast-feed an infant while taking this medicine. Do not let others touch your urine or other body fluids for 5 days after each treatment with this medicine. Caregivers should wear latex gloves to avoid touching body fluids during this time. There is a maximum amount of this medicine you should receive throughout your life. The amount depends on the medical condition being treated and your overall health. Your doctor will watch how much of this medicine you receive in your lifetime. Tell your doctor if you have taken this medicine before. What side effects may I notice from receiving this medicine? Side effects that you should report to your doctor or health care professional as soon as possible: -allergic reactions like skin rash, itching or hives, swelling of the face, lips, or tongue -low blood counts - this medicine may decrease the number of white blood cells, red blood cells and platelets. You may be at increased risk for infections and bleeding. -signs of infection - fever or chills, cough, sore throat, pain or difficulty passing urine -signs of decreased platelets or bleeding - bruising, pinpoint red spots on the skin, black, tarry stools, blood in the urine -signs of decreased red blood cells - unusually weak or tired, fainting spells, lightheadedness -breathing problems -chest pain -fast, irregular heartbeat -mouth sores -nausea, vomiting -pain, swelling, redness at site where injected -pain, tingling, numbness in the hands or feet -swelling of ankles, feet, or hands -unusual bleeding or bruising Side effects that usually do not require medical attention (report to your doctor or health care professional if they continue or are bothersome): -diarrhea -facial flushing -hair loss -loss of  appetite -missed menstrual periods -nail discoloration or damage -red or watery eyes -red colored urine -stomach  upset This list may not describe all possible side effects. Call your doctor for medical advice about side effects. You may report side effects to FDA at 1-800-FDA-1088. Where should I keep my medicine? This drug is given in a hospital or clinic and will not be stored at home. NOTE: This sheet is a summary. It may not cover all possible information. If you have questions about this medicine, talk to your doctor, pharmacist, or health care provider.    2016, [employer] Standard. (2012-04-19 09:54:34)  Cyclophosphamide injection What is this medicine? CYCLOPHOSPHAMIDE (sye kloe FOSS fa mide) is a chemotherapy drug. It slows the growth of cancer cells. This medicine is used to treat many types of cancer like lymphoma, myeloma, leukemia, breast cancer, and ovarian cancer, to name a few. This medicine may be used for other purposes; ask your health care provider or pharmacist if you have questions. What should I tell my health care provider before I take this medicine? They need to know if you have any of these conditions: -blood disorders -history of other chemotherapy -infection -kidney disease -liver disease -recent or ongoing radiation therapy -tumors in the bone marrow -an unusual or allergic reaction to cyclophosphamide, other chemotherapy, other medicines, foods, dyes, or preservatives -pregnant or trying to get pregnant -breast-feeding How should I use this medicine? This drug is usually given as an injection into a vein or muscle or by infusion into a vein. It is administered in a hospital or clinic by a specially trained health care professional. Talk to your pediatrician regarding the use of this medicine in children. Special care may be needed. Overdosage: If you think you have taken too much of this medicine contact a poison control center or emergency room at once. NOTE: This medicine is only for you. Do not share this medicine with others. What if I miss a dose? It is  important not to miss your dose. Call your doctor or health care professional if you are unable to keep an appointment. What may interact with this medicine? This medicine may interact with the following medications: -amiodarone -amphotericin B -azathioprine -certain antiviral medicines for HIV or AIDS such as protease inhibitors (e.g., indinavir, ritonavir) and zidovudine -certain blood pressure medications such as benazepril, captopril, enalapril, fosinopril, lisinopril, moexipril, monopril, perindopril, quinapril, ramipril, trandolapril -certain cancer medications such as anthracyclines (e.g., daunorubicin, doxorubicin), busulfan, cytarabine, paclitaxel, pentostatin, tamoxifen, trastuzumab -certain diuretics such as chlorothiazide, chlorthalidone, hydrochlorothiazide, indapamide, metolazone -certain medicines that treat or prevent blood clots like warfarin -certain muscle relaxants such as succinylcholine -cyclosporine -etanercept -indomethacin -medicines to increase blood counts like filgrastim, pegfilgrastim, sargramostim -medicines used as general anesthesia -metronidazole -natalizumab This list may not describe all possible interactions. Give your health care provider a list of all the medicines, herbs, non-prescription drugs, or dietary supplements you use. Also tell them if you smoke, drink alcohol, or use illegal drugs. Some items may interact with your medicine. What should I watch for while using this medicine? Visit your doctor for checks on your progress. This drug may make you feel generally unwell. This is not uncommon, as chemotherapy can affect healthy cells as well as cancer cells. Report any side effects. Continue your course of treatment even though you feel ill unless your doctor tells you to stop. Drink water or other fluids as directed. Urinate often, even at night. In some cases, you may be given additional medicines to help  with side effects. Follow all directions for  their use. Call your doctor or health care professional for advice if you get a fever, chills or sore throat, or other symptoms of a cold or flu. Do not treat yourself. This drug decreases your body's ability to fight infections. Try to avoid being around people who are sick. This medicine may increase your risk to bruise or bleed. Call your doctor or health care professional if you notice any unusual bleeding. Be careful brushing and flossing your teeth or using a toothpick because you may get an infection or bleed more easily. If you have any dental work done, tell your dentist you are receiving this medicine. You may get drowsy or dizzy. Do not drive, use machinery, or do anything that needs mental alertness until you know how this medicine affects you. Do not become pregnant while taking this medicine or for 1 year after stopping it. Women should inform their doctor if they wish to become pregnant or think they might be pregnant. Men should not father a child while taking this medicine and for 4 months after stopping it. There is a potential for serious side effects to an unborn child. Talk to your health care professional or pharmacist for more information. Do not breast-feed an infant while taking this medicine. This medicine may interfere with the ability to have a child. This medicine has caused ovarian failure in some women. This medicine has caused reduced sperm counts in some men. You should talk with your doctor or health care professional if you are concerned about your fertility. If you are going to have surgery, tell your doctor or health care professional that you have taken this medicine. What side effects may I notice from receiving this medicine? Side effects that you should report to your doctor or health care professional as soon as possible: -allergic reactions like skin rash, itching or hives, swelling of the face, lips, or tongue -low blood counts - this medicine may decrease the  number of white blood cells, red blood cells and platelets. You may be at increased risk for infections and bleeding. -signs of infection - fever or chills, cough, sore throat, pain or difficulty passing urine -signs of decreased platelets or bleeding - bruising, pinpoint red spots on the skin, black, tarry stools, blood in the urine -signs of decreased red blood cells - unusually weak or tired, fainting spells, lightheadedness -breathing problems -dark urine -dizziness -palpitations -swelling of the ankles, feet, hands -trouble passing urine or change in the amount of urine -weight gain -yellowing of the eyes or skin Side effects that usually do not require medical attention (report to your doctor or health care professional if they continue or are bothersome): -changes in nail or skin color -hair loss -missed menstrual periods -mouth sores -nausea, vomiting This list may not describe all possible side effects. Call your doctor for medical advice about side effects. You may report side effects to FDA at 1-800-FDA-1088. Where should I keep my medicine? This drug is given in a hospital or clinic and will not be stored at home. NOTE: This sheet is a summary. It may not cover all possible information. If you have questions about this medicine, talk to your doctor, pharmacist, or health care provider.    2016, [employer] Standard. (2011-11-06 16:22:58)   Pegfilgrastim injection What is this medicine? PEGFILGRASTIM (PEG fil gra stim) is a long-acting granulocyte colony-stimulating factor that stimulates the growth of neutrophils, a type of white blood cell important in  the body's fight against infection. It is used to reduce the incidence of fever and infection in patients with certain types of cancer who are receiving chemotherapy that affects the bone marrow, and to increase survival after being exposed to high doses of radiation. This medicine may be used for other purposes; ask your  health care provider or pharmacist if you have questions. What should I tell my health care provider before I take this medicine? They need to know if you have any of these conditions: -kidney disease -latex allergy -ongoing radiation therapy -sickle cell disease -skin reactions to acrylic adhesives (On-Body Injector only) -an unusual or allergic reaction to pegfilgrastim, filgrastim, other medicines, foods, dyes, or preservatives -pregnant or trying to get pregnant -breast-feeding How should I use this medicine? This medicine is for injection under the skin. If you get this medicine at home, you will be taught how to prepare and give the pre-filled syringe or how to use the On-body Injector. Refer to the patient Instructions for Use for detailed instructions. Use exactly as directed. Take your medicine at regular intervals. Do not take your medicine more often than directed. It is important that you put your used needles and syringes in a special sharps container. Do not put them in a trash can. If you do not have a sharps container, call your pharmacist or healthcare provider to get one. Talk to your pediatrician regarding the use of this medicine in children. While this drug may be prescribed for selected conditions, precautions do apply. Overdosage: If you think you have taken too much of this medicine contact a poison control center or emergency room at once. NOTE: This medicine is only for you. Do not share this medicine with others. What if I miss a dose? It is important not to miss your dose. Call your doctor or health care professional if you miss your dose. If you miss a dose due to an On-body Injector failure or leakage, a new dose should be administered as soon as possible using a single prefilled syringe for manual use. What may interact with this medicine? Interactions have not been studied. Give your health care provider a list of all the medicines, herbs, non-prescription drugs,  or dietary supplements you use. Also tell them if you smoke, drink alcohol, or use illegal drugs. Some items may interact with your medicine. This list may not describe all possible interactions. Give your health care provider a list of all the medicines, herbs, non-prescription drugs, or dietary supplements you use. Also tell them if you smoke, drink alcohol, or use illegal drugs. Some items may interact with your medicine. What should I watch for while using this medicine? You may need blood work done while you are taking this medicine. If you are going to need a MRI, CT scan, or other procedure, tell your doctor that you are using this medicine (On-Body Injector only). What side effects may I notice from receiving this medicine? Side effects that you should report to your doctor or health care professional as soon as possible: -allergic reactions like skin rash, itching or hives, swelling of the face, lips, or tongue -dizziness -fever -pain, redness, or irritation at site where injected -pinpoint red spots on the skin -red or dark-brown urine -shortness of breath or breathing problems -stomach or side pain, or pain at the shoulder -swelling -tiredness -trouble passing urine or change in the amount of urine Side effects that usually do not require medical attention (report to your doctor or health  care professional if they continue or are bothersome): -bone pain -muscle pain This list may not describe all possible side effects. Call your doctor for medical advice about side effects. You may report side effects to FDA at 1-800-FDA-1088. Where should I keep my medicine? Keep out of the reach of children. Store pre-filled syringes in a refrigerator between 2 and 8 degrees C (36 and 46 degrees F). Do not freeze. Keep in carton to protect from light. Throw away this medicine if it is left out of the refrigerator for more than 48 hours. Throw away any unused medicine after the expiration  date. NOTE: This sheet is a summary. It may not cover all possible information. If you have questions about this medicine, talk to your doctor, pharmacist, or health care provider.    2016, [employer] Standard. (2014-01-11 14:30:14)

## 2015-01-18 NOTE — Progress Notes (Signed)
Patient Care Team: [name], MD as PCP - General (Internal Medicine)  DIAGNOSIS: No matching staging information was found for the patient.  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 She had CT scans and bone scans which did not show any metastatic disease. She is current on pain medications for the pain related with inflammatory breast cancer. She undergone chemotherapy education echocardiogram and is here to start her treatment.  REVIEW OF SYSTEMS:   Constitutional: Denies fevers, chills or abnormal weight loss Eyes: Denies blurriness of vision Ears, nose, mouth, throat, and face: Denies mucositis or sore throat Respiratory: Denies cough, dyspnea or wheezes Cardiovascular: Denies palpitation, chest discomfort Gastrointestinal:  Denies nausea, heartburn or change in bowel habits Skin: Denies abnormal skin rashes Lymphatics: Denies new lymphadenopathy or easy bruising Neurological:Denies numbness, tingling or new weaknesses Behavioral/Psych: mild depression Extremities: No lower extremity edema Breast: pain in the breast  with redness related to inflammatory breast cancer with [place] All other systems were reviewed with the patient and are negative.  I have reviewed the past medical history, past surgical history, social history and family history with the patient and they are unchanged from previous note.  ALLERGIES:  has No Known Allergies.  MEDICATIONS:  Current Outpatient Prescriptions  Medication Sig Dispense Refill  . albuterol (PROVENTIL) (2.5 MG/3ML) 0.083% nebulizer solution Take 3 mLs (2.5 mg total) by nebulization every 4 (four) hours as needed for wheezing or shortness of breath. 75 mL 12  . ALPRAZolam (XANAX) 0.5 MG tablet Take 0.5 mg by mouth 3 (three) times daily as needed for anxiety or sleep.    [name] amLODipine (NORVASC) 5 MG tablet Take 5 mg by mouth 2 (two) times daily.    . clindamycin (CLEOCIN) 300 MG capsule Take 300 mg by mouth 4 (four) times daily. X 7 days    . dexamethasone (DECADRON) 4 MG tablet Take 1 tablet (4 mg total) by mouth 2 (two) times daily. Take 2 tablets by mouth once a day on the day after chemotherapy and then take 2 tablets two times a day for 2 days. Take with food. 30 tablet 1  . ibuprofen (ADVIL,MOTRIN) 200 MG tablet Take 200 mg by mouth every 6 (six) hours as needed.    . lidocaine-prilocaine (EMLA) cream Apply to affected area once 30 g 3  . LORazepam (ATIVAN) 0.5 MG tablet Take 1 tablet (0.5 mg total) by mouth at bedtime. 30 tablet 0  . metoprolol (LOPRESSOR) 50 MG tablet Take 0.5 tablets (25 mg total) by mouth 2 (two) times daily. (Patient taking differently: Take 50 mg by mouth 2 (two) times daily. )    . ondansetron (ZOFRAN) 8 MG  tablet Take 1 tablet (8 mg total) by mouth 2 (two) times daily as needed. Start on the third day after chemotherapy. 30 tablet 1  . oxyCODONE-acetaminophen (PERCOCET/ROXICET) 5-325 MG tablet Take 1 tablet by mouth every 8 (eight) hours as needed for severe pain. 60 tablet 0  . PARoxetine (PAXIL) 20 MG tablet Take 20 mg by mouth 2  (two) times daily.     . prochlorperazine (COMPAZINE) 10 MG tablet Take 1 tablet (10 mg total) by mouth every 6 (six) hours as needed (Nausea or vomiting). 30 tablet 1   No current facility-administered medications for this visit.   Facility-Administered Medications Ordered in Other Visits  Medication Dose Route Frequency Provider Last Rate Last Dose  . sodium chloride 0.9 % injection 10 mL  10 mL Intracatheter PRN Nicholas Lose, MD   10 mL at 01/18/15 1405  . technetium medronate (TC-MDP) injection 25 milli Curie  25 milli Curie Intravenous Once PRN Nicholas Lose, MD        PHYSICAL EXAMINATION: ECOG PERFORMANCE STATUS: 1 - Symptomatic but completely ambulatory  Filed Vitals:   01/18/15 1057  BP: 110/69  Pulse: 67  Temp: 98.2 F (36.8 C)  Resp: 18   Filed Weights   01/18/15 1057  Weight: 211 lb 1.6 oz (95.754 kg)    GENERAL:alert, no distress and comfortable SKIN: skin color, texture, turgor are normal, no rashes or significant lesions EYES: normal, Conjunctiva are pink and non-injected, sclera clear OROPHARYNX:no exudate, no erythema and lips, buccal mucosa, and tongue normal  NECK: supple, thyroid normal size, non-tender, without nodularity LYMPH:  no palpable lymphadenopathy in the cervical, axillary or inguinal LUNGS: clear to auscultation and percussion with normal breathing effort HEART: regular rate & rhythm and no murmurs and no lower extremity edema ABDOMEN:abdomen soft, non-tender and normal bowel sounds MUSCULOSKELETAL:no cyanosis of digits and no clubbing  NEURO: alert & oriented x 3 with fluent speech, no focal motor/sensory deficits EXTREMITIES: No lower extremity edema  LABORATORY DATA:  I have reviewed the data as listed   Chemistry      Component Value Date/Time   NA 141 01/18/2015 1031   NA 143 01/17/2015 1228   K 4.2 01/18/2015 1031   K 3.9 01/17/2015 1228   CL 106 01/17/2015 1228   CO2 26 01/18/2015 1031   CO2 26 01/17/2015 1228   BUN 13.6  01/18/2015 1031   BUN 15 01/17/2015 1228   CREATININE 0.8 01/18/2015 1031   CREATININE 0.78 01/17/2015 1228      Component Value Date/Time   CALCIUM 9.0 01/18/2015 1031   CALCIUM 9.5 01/17/2015 1228   ALKPHOS 66 01/18/2015 1031   ALKPHOS 55 10/29/2007 2156   AST 21 01/18/2015 1031   AST 18 10/29/2007 2156   ALT 14 01/18/2015 1031   ALT 12 10/29/2007 2156   BILITOT <0.30 01/18/2015 1031   BILITOT 0.5 10/29/2007 2156       Lab Results  Component Value Date   WBC 5.3 01/18/2015   HGB 13.8 01/18/2015   HCT 41.4 01/18/2015   MCV 96.7 01/18/2015   PLT 249 01/18/2015   NEUTROABS 2.8 01/18/2015     ASSESSMENT & PLAN:  Breast cancer of upper-outer quadrant of left female breast ([place]) Left breast biopsy 12/26/14: inflammatory breast cancer Invasive ductal carcinoma with lymphovascular invasion, 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  Treatment Plan: 1. Neoadjuvant chemotherapy with dose dense Adriamycin and Cytoxan 4 followed by Taxol and carboplatin 12 weekly 2. Followed by Mastectomy 3. Followed by Adjuvant XRT Patient wlll need Genetic counseling Ct scans and bone scans: do not show evidence of metastatic disease ------------------------------------------------------------------------------------------------------------------------------------------------------------------ Current treatment: Cycle 1 day 1 neoadjuvant dose dense Adriamycin and Cytoxan I reviewed her antiemetic regimen in great detail. Echocardiogram was normal. Lead work was reviewed her CBC and CMP were normal.  Pain related to inflammatory breast cancer: Much improved with pain medication. I renewed her pain medicine today. RTC in1 week for tox check  No orders of the defined types were placed  in this encounter.   The patient has a good understanding of the overall plan. she agrees with it. she will call with any problems that may develop before the next visit here.   Rulon Eisenmenger, MD 01/18/2015

## 2015-01-20 ENCOUNTER — Telehealth: Payer: Self-pay | Admitting: Hematology and Oncology

## 2015-01-20 NOTE — Telephone Encounter (Signed)
Called and left a message with added labs

## 2015-01-21 ENCOUNTER — Telehealth: Payer: Self-pay | Admitting: *Deleted

## 2015-01-21 NOTE — Telephone Encounter (Signed)
Pt left vm stating she went to pharmacy to pick up anti-nausea meds and they were not there. Left 2 msgs back informing pt that meds were sent to [facility] on [address] and if we need to call them into another pharmacy to please return call with that information. Provided contact information to the pt.

## 2015-01-21 NOTE — Telephone Encounter (Signed)
1st A/C on 01/18/15. Left VMM for patient to return call to clinic and speak with any nurse.

## 2015-01-23 NOTE — Assessment & Plan Note (Deleted)
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  Treatment Plan: 1. Neoadjuvant chemotherapy with dose dense Adriamycin and Cytoxan 4 followed by Taxol and carboplatin 12 weekly 2. Followed by Mastectomy 3. Followed by Adjuvant XRT Patient wlll need Genetic counseling Ct scans and bone scans: do not show evidence of metastatic disease ------------------------------------------------------------------------------------------------------------------------------------------------------------------ Current treatment: Cycle 1 day 8 neoadjuvant dose dense Adriamycin and Cytoxan Chemo Toxicities:  Pain related to inflammatory breast cancer: Much improved with pain medication. I renewed her pain medicine today. RTC in1 week for Cycle 2

## 2015-01-24 ENCOUNTER — Ambulatory Visit: Payer: Medicare Other | Admitting: Hematology and Oncology

## 2015-01-24 ENCOUNTER — Other Ambulatory Visit: Payer: Medicare Other

## 2015-01-25 ENCOUNTER — Telehealth: Payer: Self-pay | Admitting: *Deleted

## 2015-01-25 NOTE — Telephone Encounter (Signed)
Attempted to call pt to discuss missed appt with Dr. Lindi Adie for nadir check this week. Received msg stating pt was unable to be reach and was unable to leave a msg. Called pt fiance and left a vm requesting a return call. Contact information provided.

## 2015-01-28 ENCOUNTER — Other Ambulatory Visit: Payer: Self-pay | Admitting: *Deleted

## 2015-01-28 DIAGNOSIS — C50412 Malignant neoplasm of upper-outer quadrant of left female breast: Secondary | ICD-10-CM

## 2015-01-28 MED ORDER — HYDROCOD POLST-CPM POLST ER 10-8 MG/5ML PO SUER: 5.0000 mL | Freq: Two times a day (BID) | ORAL | Status: DC

## 2015-01-28 MED ORDER — AMOXICILLIN 500 MG PO TABS: 500.0000 mg | ORAL_TABLET | Freq: Two times a day (BID) | ORAL | Status: DC

## 2015-01-28 NOTE — Telephone Encounter (Signed)
Pt called c/o severe cough. Without symptoms of fever. Encourage pt to call with fever. Received verbal understanding.

## 2015-02-01 ENCOUNTER — Ambulatory Visit (HOSPITAL_BASED_OUTPATIENT_CLINIC_OR_DEPARTMENT_OTHER): Payer: Medicare Other | Admitting: Hematology and Oncology

## 2015-02-01 ENCOUNTER — Telehealth: Payer: Self-pay | Admitting: Hematology and Oncology

## 2015-02-01 ENCOUNTER — Encounter: Payer: Self-pay | Admitting: *Deleted

## 2015-02-01 ENCOUNTER — Encounter: Payer: Self-pay | Admitting: Hematology and Oncology

## 2015-02-01 ENCOUNTER — Ambulatory Visit (HOSPITAL_BASED_OUTPATIENT_CLINIC_OR_DEPARTMENT_OTHER): Payer: Medicare Other

## 2015-02-01 ENCOUNTER — Other Ambulatory Visit (HOSPITAL_BASED_OUTPATIENT_CLINIC_OR_DEPARTMENT_OTHER): Payer: Medicare Other

## 2015-02-01 VITALS — BP 125/85 | HR 101 | Temp 98.1°F | Resp 18 | Ht 67.5 in | Wt 209.3 lb

## 2015-02-01 DIAGNOSIS — C773 Secondary and unspecified malignant neoplasm of axilla and upper limb lymph nodes: Secondary | ICD-10-CM | POA: Diagnosis not present

## 2015-02-01 DIAGNOSIS — Z5111 Encounter for antineoplastic chemotherapy: Secondary | ICD-10-CM | POA: Diagnosis not present

## 2015-02-01 DIAGNOSIS — C50412 Malignant neoplasm of upper-outer quadrant of left female breast: Secondary | ICD-10-CM

## 2015-02-01 DIAGNOSIS — N644 Mastodynia: Secondary | ICD-10-CM

## 2015-02-01 DIAGNOSIS — R110 Nausea: Secondary | ICD-10-CM

## 2015-02-01 LAB — CBC WITH DIFFERENTIAL/PLATELET
BASO%: 0.4 % (ref 0.0–2.0)
Basophils Absolute: 0 10*3/uL (ref 0.0–0.1)
EOS%: 0.5 % (ref 0.0–7.0)
Eosinophils Absolute: 0.1 10*3/uL (ref 0.0–0.5)
HEMATOCRIT: 39.1 % (ref 34.8–46.6)
HEMOGLOBIN: 12.7 g/dL (ref 11.6–15.9)
LYMPH#: 2.2 10*3/uL (ref 0.9–3.3)
LYMPH%: 19.3 % (ref 14.0–49.7)
MCH: 31.2 pg (ref 25.1–34.0)
MCHC: 32.5 g/dL (ref 31.5–36.0)
MCV: 96.1 fL (ref 79.5–101.0)
MONO#: 1.3 10*3/uL — ABNORMAL HIGH (ref 0.1–0.9)
MONO%: 11.4 % (ref 0.0–14.0)
NEUT%: 68.4 % (ref 38.4–76.8)
NEUTROS ABS: 7.8 10*3/uL — AB (ref 1.5–6.5)
PLATELETS: 198 10*3/uL (ref 145–400)
RBC: 4.07 10*6/uL (ref 3.70–5.45)
RDW: 13.2 % (ref 11.2–14.5)
WBC: 11.5 10*3/uL — ABNORMAL HIGH (ref 3.9–10.3)

## 2015-02-01 LAB — COMPREHENSIVE METABOLIC PANEL
ALBUMIN: 3.9 g/dL (ref 3.5–5.0)
ALK PHOS: 84 U/L (ref 40–150)
ALT: 9 U/L (ref 0–55)
ANION GAP: 8 meq/L (ref 3–11)
AST: 11 U/L (ref 5–34)
BUN: 11.2 mg/dL (ref 7.0–26.0)
CALCIUM: 9 mg/dL (ref 8.4–10.4)
CHLORIDE: 105 meq/L (ref 98–109)
CO2: 30 mEq/L — ABNORMAL HIGH (ref 22–29)
CREATININE: 0.8 mg/dL (ref 0.6–1.1)
EGFR: 88 mL/min/{1.73_m2} — ABNORMAL LOW (ref >90)
Glucose: 96 mg/dl (ref 70–140)
Potassium: 3.9 mEq/L (ref 3.5–5.1)
Sodium: 143 mEq/L (ref 136–145)
TOTAL PROTEIN: 7 g/dL (ref 6.4–8.3)

## 2015-02-01 MED ORDER — SODIUM CHLORIDE 0.9 % IV SOLN
Freq: Once | INTRAVENOUS | Status: AC
  Administered 2015-02-01: 11:00:00 via INTRAVENOUS

## 2015-02-01 MED ORDER — DOXORUBICIN HCL CHEMO IV INJECTION 2 MG/ML
60.0000 mg/m2 | Freq: Once | INTRAVENOUS | Status: AC
  Administered 2015-02-01: 128 mg via INTRAVENOUS
  Filled 2015-02-01: qty 64

## 2015-02-01 MED ORDER — SODIUM CHLORIDE 0.9 % IV SOLN
600.0000 mg/m2 | Freq: Once | INTRAVENOUS | Status: AC
  Administered 2015-02-01: 1280 mg via INTRAVENOUS
  Filled 2015-02-01: qty 64

## 2015-02-01 MED ORDER — SODIUM CHLORIDE 0.9 % IJ SOLN
10.0000 mL | INTRAMUSCULAR | Status: DC | PRN
  Administered 2015-02-01: 10 mL
  Filled 2015-02-01: qty 10

## 2015-02-01 MED ORDER — SODIUM CHLORIDE 0.9 % IV SOLN
Freq: Once | INTRAVENOUS | Status: AC
  Administered 2015-02-01: 11:00:00 via INTRAVENOUS
  Filled 2015-02-01: qty 5

## 2015-02-01 MED ORDER — PEGFILGRASTIM 6 MG/0.6ML ~~LOC~~ PSKT
6.0000 mg | PREFILLED_SYRINGE | Freq: Once | SUBCUTANEOUS | Status: AC
  Administered 2015-02-01: 6 mg via SUBCUTANEOUS
  Filled 2015-02-01: qty 0.6

## 2015-02-01 MED ORDER — PALONOSETRON HCL INJECTION 0.25 MG/5ML
0.2500 mg | Freq: Once | INTRAVENOUS | Status: AC
  Administered 2015-02-01: 0.25 mg via INTRAVENOUS

## 2015-02-01 MED ORDER — PALONOSETRON HCL INJECTION 0.25 MG/5ML
INTRAVENOUS | Status: AC
  Filled 2015-02-01: qty 5

## 2015-02-01 MED ORDER — HEPARIN SOD (PORK) LOCK FLUSH 100 UNIT/ML IV SOLN
500.0000 [IU] | Freq: Once | INTRAVENOUS | Status: AC | PRN
  Administered 2015-02-01: 500 [IU]
  Filled 2015-02-01: qty 5

## 2015-02-01 NOTE — Telephone Encounter (Signed)
Gave patient avs report and appointments for February.  °

## 2015-02-01 NOTE — Progress Notes (Signed)
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 Today cycle 2 of treatment. She reports that after cycle one she had nausea for 2-3 days. She missed her appointment on today a toxicity check and she tells me that she forgot about it. She denies any current nausea vomiting. The pain is under reasonable control with the pain medications.  REVIEW OF SYSTEMS:   Constitutional: Denies fevers, chills or abnormal weight loss Eyes: Denies blurriness of vision Ears, nose, mouth, throat, and face: Denies mucositis or sore throat Respiratory: Denies cough, dyspnea or wheezes Cardiovascular: Denies palpitation, chest discomfort Gastrointestinal:  Denies nausea, heartburn or change in bowel habits Skin: Denies abnormal skin rashes Lymphatics: Denies new lymphadenopathy or easy bruising Neurological:Denies numbness, tingling or new weaknesses Behavioral/Psych: Mood is stable, no new changes  Extremities: No lower extremity edema Breast: Pain. Symmetric breast cancer All other systems were reviewed with the  patient and are negative.  I have reviewed the past medical history, past surgical history, social history and family history with the patient and they are unchanged from previous note.  ALLERGIES:  has No Known Allergies.  MEDICATIONS:  Current Outpatient Prescriptions  Medication Sig Dispense Refill  . albuterol (PROVENTIL) (2.5 MG/3ML) 0.083% nebulizer solution Take 3 mLs (2.5 mg total) by nebulization every 4 (four) hours as needed for wheezing or shortness of breath. 75 mL 12  . ALPRAZolam (XANAX) 0.5 MG tablet Take 0.5 mg by mouth 3 (three) times daily as needed for anxiety or sleep.    [name] amLODipine (NORVASC) 5 MG tablet Take 5 mg by mouth 2 (two) times daily.    [name] amoxicillin (AMOXIL) 500 MG tablet Take 1 tablet (500 mg total) by mouth 2 (two) times daily. 14 tablet 0  . chlorpheniramine-HYDROcodone (TUSSIONEX) 10-8 MG/5ML SUER Take 5 mLs by mouth 2 (two) times daily. 140 mL 0  . clindamycin (CLEOCIN) 300 MG capsule Take 300 mg by mouth 4 (four) times daily. X 7 days    . dexamethasone (DECADRON) 4 MG tablet Take 1 tablet (4 mg total) by mouth 2 (two) times daily. Take 2 tablets by mouth once a day on the day after chemotherapy and then take 2 tablets two times a day for 2 days. Take with food. 30 tablet 1  . ibuprofen (ADVIL,MOTRIN) 200 MG tablet Take 200 mg by mouth every 6 (six) hours as needed.    . lidocaine-prilocaine (EMLA) cream Apply to affected area once 30 g 3  . LORazepam (ATIVAN) 0.5 MG tablet Take 1 tablet (0.5 mg total) by mouth at bedtime. 30 tablet 0  . metoprolol (LOPRESSOR) 50 MG tablet Take 0.5 tablets (25 mg total)  by mouth 2 (two) times daily. (Patient taking differently: Take 50 mg by mouth 2 (two) times daily. )    . ondansetron (ZOFRAN) 8 MG tablet Take 1 tablet (8 mg total) by mouth 2 (two) times daily as needed. Start on the third day after chemotherapy. 30 tablet 1  . oxyCODONE-acetaminophen (PERCOCET/ROXICET) 5-325 MG tablet Take 1 tablet by mouth every 8  (eight) hours as needed for severe pain. 60 tablet 0  . PARoxetine (PAXIL) 20 MG tablet Take 20 mg by mouth 2 (two) times daily.     . prochlorperazine (COMPAZINE) 10 MG tablet Take 1 tablet (10 mg total) by mouth every 6 (six) hours as needed (Nausea or vomiting). 30 tablet 1   No current facility-administered medications for this visit.    PHYSICAL EXAMINATION: ECOG PERFORMANCE STATUS: 1 - Symptomatic but completely ambulatory  Filed Vitals:   02/01/15 0949  BP: 125/85  Pulse: 101  Temp: 98.1 F (36.7 C)  Resp: 18   Filed Weights   02/01/15 0949  Weight: 209 lb 4.8 oz (94.938 kg)    GENERAL:alert, no distress and comfortable SKIN: skin color, texture, turgor are normal, no rashes or significant lesions EYES: normal, Conjunctiva are pink and non-injected, sclera clear OROPHARYNX:no exudate, no erythema and lips, buccal mucosa, and tongue normal  NECK: supple, thyroid normal size, non-tender, without nodularity LYMPH:  no palpable lymphadenopathy in the cervical, axillary or inguinal LUNGS: clear to auscultation and percussion with normal breathing effort HEART: regular rate & rhythm and no murmurs and no lower extremity edema ABDOMEN:abdomen soft, non-tender and normal bowel sounds MUSCULOSKELETAL:no cyanosis of digits and no clubbing  NEURO: alert & oriented x 3 with fluent speech, no focal motor/sensory deficits EXTREMITIES: No lower extremity edema LABORATORY DATA:  I have reviewed the data as listed   Chemistry      Component Value Date/Time   NA 143 02/01/2015 0938   NA 143 01/17/2015 1228   K 3.9 02/01/2015 0938   K 3.9 01/17/2015 1228   CL 106 01/17/2015 1228   CO2 30* 02/01/2015 0938   CO2 26 01/17/2015 1228   BUN 11.2 02/01/2015 0938   BUN 15 01/17/2015 1228   CREATININE 0.8 02/01/2015 0938   CREATININE 0.78 01/17/2015 1228      Component Value Date/Time   CALCIUM 9.0 02/01/2015 0938   CALCIUM 9.5 01/17/2015 1228   ALKPHOS 84 02/01/2015 0938   ALKPHOS  55 10/29/2007 2156   AST 11 02/01/2015 0938   AST 18 10/29/2007 2156   ALT 9 02/01/2015 0938   ALT 12 10/29/2007 2156   BILITOT <0.30 02/01/2015 0938   BILITOT 0.5 10/29/2007 2156       Lab Results  Component Value Date   WBC 11.5* 02/01/2015   HGB 12.7 02/01/2015   HCT 39.1 02/01/2015   MCV 96.1 02/01/2015   PLT 198 02/01/2015   NEUTROABS 7.8* 02/01/2015   ASSESSMENT & PLAN:  Breast cancer of upper-outer quadrant of left female breast ([place]) Left breast biopsy 12/26/14: inflammatory breast cancer Invasive ductal carcinoma with lymphovascular invasion, 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  Treatment Plan: 1. Neoadjuvant chemotherapy with dose dense Adriamycin and Cytoxan 4 followed by Taxol and carboplatin 12 weekly 2. Followed by Mastectomy 3. Followed by Adjuvant  XRT Patient wlll need Genetic counseling Ct scans and bone scans: do not show evidence of metastatic disease ------------------------------------------------------------------------------------------------------------------------------------------------------- Current treatment: Cycle 2 day 1 neoadjuvant dose dense Adriamycin and Cytoxan Chemo Toxicities: 1. Nausea 2-3 days after chemotherapy but she did not become her nausea prescription and hence she did not take anything for it.  monitoring closely for toxicities Pain related to inflammatory breast cancer: Much improved with pain medication. RTC in 2 weeks for Cycle 3   No orders of the defined types were placed in this encounter.   The patient has a good understanding of the overall plan. she agrees with it. she will call with any problems that may develop before the next visit here.   Rulon Eisenmenger, MD 02/01/2015

## 2015-02-01 NOTE — Patient Instructions (Signed)
[facility] Discharge Instructions for Patients Receiving Chemotherapy  Today you received the following chemotherapy agents Adriamycin and Cytoxan.   To help prevent nausea and vomiting after your treatment, we encourage you to take your nausea medication.   If you develop nausea and vomiting that is not controlled by your nausea medication, call the clinic.   BELOW ARE SYMPTOMS THAT SHOULD BE REPORTED IMMEDIATELY:  *FEVER GREATER THAN 100.5 F  *CHILLS WITH OR WITHOUT FEVER  NAUSEA AND VOMITING THAT IS NOT CONTROLLED WITH YOUR NAUSEA MEDICATION  *UNUSUAL SHORTNESS OF BREATH  *UNUSUAL BRUISING OR BLEEDING  TENDERNESS IN MOUTH AND THROAT WITH OR WITHOUT PRESENCE OF ULCERS  *URINARY PROBLEMS  *BOWEL PROBLEMS  UNUSUAL RASH Items with * indicate a potential emergency and should be followed up as soon as possible.  Feel free to call the clinic you have any questions or concerns. The clinic phone number is [phone].  Please show the [facility] at check-in to the Emergency Department and triage nurse.

## 2015-02-01 NOTE — Progress Notes (Signed)
Pt reports not eating d/t nausea.  Tooth fell out 01/30/15 - MD needs to check wound.

## 2015-02-01 NOTE — Assessment & Plan Note (Signed)
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  Treatment Plan: 1. Neoadjuvant chemotherapy with dose dense Adriamycin and Cytoxan 4 followed by Taxol and carboplatin 12 weekly 2. Followed by Mastectomy 3. Followed by Adjuvant XRT Patient wlll need Genetic counseling Ct scans and bone scans: do not show evidence of metastatic disease ------------------------------------------------------------------------------------------------------------------------------------------------------------------ Current treatment: Cycle 2 day 1 neoadjuvant dose dense Adriamycin and Cytoxan Chemo Toxicities:  Pain related to inflammatory breast cancer: Much improved with pain medication. I renewed her pain medicine today. RTC in1 week for Cycle 2

## 2015-02-04 ENCOUNTER — Other Ambulatory Visit: Payer: Self-pay | Admitting: Surgery

## 2015-02-04 ENCOUNTER — Ambulatory Visit
Admission: RE | Admit: 2015-02-04 | Discharge: 2015-02-04 | Disposition: A | Discharge status: Home / Self Care | Payer: Medicare Other | Source: Ambulatory Visit | Attending: Surgery | Admitting: Surgery

## 2015-02-04 DIAGNOSIS — R928 Other abnormal and inconclusive findings on diagnostic imaging of breast: Secondary | ICD-10-CM

## 2015-02-04 MED ORDER — GADOBENATE DIMEGLUMINE 529 MG/ML IV SOLN
20.0000 mL | Freq: Once | INTRAVENOUS | Status: AC | PRN
  Administered 2015-02-04: 20 mL via INTRAVENOUS

## 2015-02-12 ENCOUNTER — Other Ambulatory Visit: Payer: Self-pay | Admitting: *Deleted

## 2015-02-12 DIAGNOSIS — C50412 Malignant neoplasm of upper-outer quadrant of left female breast: Secondary | ICD-10-CM

## 2015-02-12 MED ORDER — OXYCODONE-ACETAMINOPHEN 5-325 MG PO TABS: 1.0000 | ORAL_TABLET | Freq: Three times a day (TID) | ORAL | Status: DC | PRN

## 2015-02-14 NOTE — Assessment & Plan Note (Signed)
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  Treatment Plan: 1. Neoadjuvant chemotherapy with dose dense Adriamycin and Cytoxan 4 followed by Taxol and carboplatin 12 weekly 2. Followed by Mastectomy 3. Followed by Adjuvant XRT Patient wlll need Genetic counseling Ct scans and bone scans: do not show evidence of metastatic disease ------------------------------------------------------------------------------------------------------------------------------------------------------- Current treatment: Cycle 3 day 1 neoadjuvant dose dense Adriamycin and Cytoxan Chemo Toxicities: 1. Nausea 2-3 days after chemotherapy but she did not become her nausea prescription and hence she did not take anything for it. monitoring closely for toxicities Pain related to inflammatory breast cancer: Much improved with pain medication. RTC in 2 weeks for Cycle 4

## 2015-02-15 ENCOUNTER — Ambulatory Visit (HOSPITAL_BASED_OUTPATIENT_CLINIC_OR_DEPARTMENT_OTHER): Payer: Medicare Other | Admitting: Hematology and Oncology

## 2015-02-15 ENCOUNTER — Telehealth: Payer: Self-pay | Admitting: Hematology and Oncology

## 2015-02-15 ENCOUNTER — Other Ambulatory Visit (HOSPITAL_BASED_OUTPATIENT_CLINIC_OR_DEPARTMENT_OTHER): Payer: Medicare Other

## 2015-02-15 ENCOUNTER — Ambulatory Visit (HOSPITAL_BASED_OUTPATIENT_CLINIC_OR_DEPARTMENT_OTHER): Payer: Medicare Other

## 2015-02-15 ENCOUNTER — Encounter: Payer: Self-pay | Admitting: Hematology and Oncology

## 2015-02-15 VITALS — BP 114/87 | HR 92 | Temp 98.3°F | Resp 19 | Ht 67.5 in | Wt 206.8 lb

## 2015-02-15 DIAGNOSIS — M898X9 Other specified disorders of bone, unspecified site: Secondary | ICD-10-CM

## 2015-02-15 DIAGNOSIS — Z5111 Encounter for antineoplastic chemotherapy: Secondary | ICD-10-CM | POA: Diagnosis not present

## 2015-02-15 DIAGNOSIS — L659 Nonscarring hair loss, unspecified: Secondary | ICD-10-CM

## 2015-02-15 DIAGNOSIS — F329 Major depressive disorder, single episode, unspecified: Secondary | ICD-10-CM

## 2015-02-15 DIAGNOSIS — C50412 Malignant neoplasm of upper-outer quadrant of left female breast: Secondary | ICD-10-CM | POA: Diagnosis present

## 2015-02-15 LAB — COMPREHENSIVE METABOLIC PANEL
ALT: 10 U/L (ref 0–55)
ANION GAP: 11 meq/L (ref 3–11)
AST: 15 U/L (ref 5–34)
Albumin: 3.7 g/dL (ref 3.5–5.0)
Alkaline Phosphatase: 99 U/L (ref 40–150)
BUN: 7.2 mg/dL (ref 7.0–26.0)
CALCIUM: 8.9 mg/dL (ref 8.4–10.4)
CHLORIDE: 108 meq/L (ref 98–109)
CO2: 24 meq/L (ref 22–29)
CREATININE: 0.8 mg/dL (ref 0.6–1.1)
Glucose: 115 mg/dl (ref 70–140)
POTASSIUM: 3.5 meq/L (ref 3.5–5.1)
Sodium: 143 mEq/L (ref 136–145)
Total Bilirubin: <0.3 mg/dL (ref 0.20–1.20)
Total Protein: 6.6 g/dL (ref 6.4–8.3)

## 2015-02-15 LAB — CBC WITH DIFFERENTIAL/PLATELET
BASO%: 0.5 % (ref 0.0–2.0)
BASOS ABS: 0.1 10*3/uL (ref 0.0–0.1)
EOS%: 0.7 % (ref 0.0–7.0)
Eosinophils Absolute: 0.1 10*3/uL (ref 0.0–0.5)
HEMATOCRIT: 34.2 % — AB (ref 34.8–46.6)
HGB: 11.1 g/dL — ABNORMAL LOW (ref 11.6–15.9)
LYMPH%: 10.3 % — AB (ref 14.0–49.7)
MCH: 31.7 pg (ref 25.1–34.0)
MCHC: 32.5 g/dL (ref 31.5–36.0)
MCV: 97.7 fL (ref 79.5–101.0)
MONO#: 1.6 10*3/uL — AB (ref 0.1–0.9)
MONO%: 10.4 % (ref 0.0–14.0)
NEUT#: 12.1 10*3/uL — ABNORMAL HIGH (ref 1.5–6.5)
NEUT%: 78.1 % — AB (ref 38.4–76.8)
PLATELETS: 184 10*3/uL (ref 145–400)
RBC: 3.5 10*6/uL — AB (ref 3.70–5.45)
RDW: 13.2 % (ref 11.2–14.5)
WBC: 15.4 10*3/uL — ABNORMAL HIGH (ref 3.9–10.3)
lymph#: 1.6 10*3/uL (ref 0.9–3.3)

## 2015-02-15 MED ORDER — LORAZEPAM 0.5 MG PO TABS: 0.5000 mg | ORAL_TABLET | Freq: Every day | ORAL | Status: DC

## 2015-02-15 MED ORDER — DOXORUBICIN HCL CHEMO IV INJECTION 2 MG/ML
60.0000 mg/m2 | Freq: Once | INTRAVENOUS | Status: AC
  Administered 2015-02-15: 128 mg via INTRAVENOUS
  Filled 2015-02-15: qty 64

## 2015-02-15 MED ORDER — HEPARIN SOD (PORK) LOCK FLUSH 100 UNIT/ML IV SOLN
500.0000 [IU] | Freq: Once | INTRAVENOUS | Status: AC | PRN
  Administered 2015-02-15: 500 [IU]
  Filled 2015-02-15: qty 5

## 2015-02-15 MED ORDER — PALONOSETRON HCL INJECTION 0.25 MG/5ML
0.2500 mg | Freq: Once | INTRAVENOUS | Status: AC
  Administered 2015-02-15: 0.25 mg via INTRAVENOUS

## 2015-02-15 MED ORDER — SODIUM CHLORIDE 0.9 % IV SOLN
Freq: Once | INTRAVENOUS | Status: AC
  Administered 2015-02-15: 11:00:00 via INTRAVENOUS
  Filled 2015-02-15: qty 5

## 2015-02-15 MED ORDER — SODIUM CHLORIDE 0.9 % IJ SOLN
10.0000 mL | INTRAMUSCULAR | Status: DC | PRN
  Administered 2015-02-15: 10 mL
  Filled 2015-02-15: qty 10

## 2015-02-15 MED ORDER — SODIUM CHLORIDE 0.9 % IV SOLN
Freq: Once | INTRAVENOUS | Status: AC
  Administered 2015-02-15: 11:00:00 via INTRAVENOUS

## 2015-02-15 MED ORDER — PEGFILGRASTIM 6 MG/0.6ML ~~LOC~~ PSKT
6.0000 mg | PREFILLED_SYRINGE | Freq: Once | SUBCUTANEOUS | Status: AC
  Administered 2015-02-15: 6 mg via SUBCUTANEOUS
  Filled 2015-02-15: qty 0.6

## 2015-02-15 MED ORDER — PALONOSETRON HCL INJECTION 0.25 MG/5ML
INTRAVENOUS | Status: AC
  Filled 2015-02-15: qty 5

## 2015-02-15 MED ORDER — SODIUM CHLORIDE 0.9 % IV SOLN
600.0000 mg/m2 | Freq: Once | INTRAVENOUS | Status: AC
  Administered 2015-02-15: 1280 mg via INTRAVENOUS
  Filled 2015-02-15: qty 64

## 2015-02-15 NOTE — Telephone Encounter (Signed)
Gave patient avs report and appointment for February  °

## 2015-02-15 NOTE — Patient Instructions (Signed)
[facility] Discharge Instructions for Patients Receiving Chemotherapy  Today you received the following chemotherapy agents: Adriamycin and Cytoxan.   To help prevent nausea and vomiting after your treatment, we encourage you to take your nausea medication: Decadron. Take one twice daily. Begin the morning of 02/16/15. Take Compazine every 6 hours as needed. For nausea on or after 2/13, you make take Zofran every 8 hours as needed.   If you develop nausea and vomiting that is not controlled by your nausea medication, call the clinic.   BELOW ARE SYMPTOMS THAT SHOULD BE REPORTED IMMEDIATELY:  *FEVER GREATER THAN 100.5 F  *CHILLS WITH OR WITHOUT FEVER  NAUSEA AND VOMITING THAT IS NOT CONTROLLED WITH YOUR NAUSEA MEDICATION  *UNUSUAL SHORTNESS OF BREATH  *UNUSUAL BRUISING OR BLEEDING  TENDERNESS IN MOUTH AND THROAT WITH OR WITHOUT PRESENCE OF ULCERS  *URINARY PROBLEMS  *BOWEL PROBLEMS  UNUSUAL RASH Items with * indicate a potential emergency and should be followed up as soon as possible.  Feel free to call the clinic should you have any questions or concerns. The clinic phone number is [phone].  Please show the [facility] at check-in to the Emergency Department and triage nurse.

## 2015-02-15 NOTE — Progress Notes (Signed)
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 Patient reports of the breast is feeling much better. It is much more softer and less painful. It is also not as red as before. She has had nausea from chemotherapy that lasted 2-3 days after chemotherapy. She is also complained of low back pain because of Neulasta.  REVIEW OF SYSTEMS:   Constitutional: Denies fevers, chills or abnormal weight loss Eyes: Denies blurriness of vision Ears, nose, mouth, throat, and face: Denies mucositis or sore throat Respiratory: Denies cough, dyspnea or wheezes Cardiovascular: Denies palpitation, chest discomfort Gastrointestinal:  Denies nausea, heartburn or change in bowel habits Skin: Denies abnormal skin rashes Lymphatics: Denies new lymphadenopathy or easy bruising Neurological:Denies numbness, tingling or new weaknesses Behavioral/Psych: Depression and anxiety Extremities: No lower extremity edema Breast: Left breast and from a tree cancer is significantly improved All other systems were reviewed  with the patient and are negative.  I have reviewed the past medical history, past surgical history, social history and family history with the patient and they are unchanged from previous note.  ALLERGIES:  has No Known Allergies.  MEDICATIONS:  Current Outpatient Prescriptions  Medication Sig Dispense Refill  . albuterol (PROVENTIL) (2.5 MG/3ML) 0.083% nebulizer solution Take 3 mLs (2.5 mg total) by nebulization every 4 (four) hours as needed for wheezing or shortness of breath. 75 mL 12  . ALPRAZolam (XANAX) 0.5 MG tablet Take 0.5 mg by mouth 3 (three) times daily as needed for anxiety or sleep.    [name] amLODipine (NORVASC) 5 MG tablet Take 5 mg by mouth 2 (two) times daily.    [name] amoxicillin (AMOXIL) 500 MG tablet Take 1 tablet (500 mg total) by mouth 2 (two) times daily. 14 tablet 0  . chlorpheniramine-HYDROcodone (TUSSIONEX) 10-8 MG/5ML SUER Take 5 mLs by mouth 2 (two) times daily. 140 mL 0  . clindamycin (CLEOCIN) 300 MG capsule Take 300 mg by mouth 4 (four) times daily. X 7 days    . dexamethasone (DECADRON) 4 MG tablet Take 1 tablet (4 mg total) by mouth 2 (two) times daily. Take 2 tablets by mouth once a day on the day after chemotherapy and then take 2 tablets two times a day for 2 days. Take with food. 30 tablet 1  . ibuprofen (ADVIL,MOTRIN) 200 MG tablet Take 200 mg by mouth every 6 (six) hours as needed.    . lidocaine-prilocaine (EMLA) cream Apply to affected area once 30 g 3  . LORazepam (ATIVAN) 0.5 MG tablet Take 1 tablet (0.5 mg total) by mouth at bedtime. 30 tablet 0  . metoprolol (LOPRESSOR) 50 MG tablet Take 0.5 tablets (25  mg total) by mouth 2 (two) times daily. (Patient taking differently: Take 50 mg by mouth 2 (two) times daily. )    . ondansetron (ZOFRAN) 8 MG tablet Take 1 tablet (8 mg total) by mouth 2 (two) times daily as needed. Start on the third day after chemotherapy. 30 tablet 1  . oxyCODONE-acetaminophen (PERCOCET/ROXICET) 5-325 MG tablet Take 1 tablet by mouth  every 8 (eight) hours as needed for severe pain. 30 tablet 0  . PARoxetine (PAXIL) 20 MG tablet Take 20 mg by mouth 2 (two) times daily.     . prochlorperazine (COMPAZINE) 10 MG tablet Take 1 tablet (10 mg total) by mouth every 6 (six) hours as needed (Nausea or vomiting). 30 tablet 1   No current facility-administered medications for this visit.    PHYSICAL EXAMINATION: ECOG PERFORMANCE STATUS: 1 - Symptomatic but completely ambulatory  Filed Vitals:   02/15/15 0927  BP: 114/87  Pulse: 92  Temp: 98.3 F (36.8 C)  Resp: 19   Filed Weights   02/15/15 0927  Weight: 206 lb 12.8 oz (93.804 kg)    GENERAL:alert, no distress and comfortable SKIN: skin color, texture, turgor are normal, no rashes or significant lesions EYES: normal, Conjunctiva are pink and non-injected, sclera clear OROPHARYNX:no exudate, no erythema and lips, buccal mucosa, and tongue normal  NECK: supple, thyroid normal size, non-tender, without nodularity LYMPH:  no palpable lymphadenopathy in the cervical, axillary or inguinal LUNGS: clear to auscultation and percussion with normal breathing effort HEART: regular rate & rhythm and no murmurs and no lower extremity edema ABDOMEN:abdomen soft, non-tender and normal bowel sounds MUSCULOSKELETAL:no cyanosis of digits and no clubbing  NEURO: alert & oriented x 3 with fluent speech, no focal motor/sensory deficits EXTREMITIES: No lower extremity edema  LABORATORY DATA:  I have reviewed the data as listed   Chemistry      Component Value Date/Time   NA 143 02/01/2015 0938   NA 143 01/17/2015 1228   K 3.9 02/01/2015 0938   K 3.9 01/17/2015 1228   CL 106 01/17/2015 1228   CO2 30* 02/01/2015 0938   CO2 26 01/17/2015 1228   BUN 11.2 02/01/2015 0938   BUN 15 01/17/2015 1228   CREATININE 0.8 02/01/2015 0938   CREATININE 0.78 01/17/2015 1228      Component Value Date/Time   CALCIUM 9.0 02/01/2015 0938   CALCIUM 9.5 01/17/2015 1228   ALKPHOS 84 02/01/2015 0938     ALKPHOS 55 10/29/2007 2156   AST 11 02/01/2015 0938   AST 18 10/29/2007 2156   ALT 9 02/01/2015 0938   ALT 12 10/29/2007 2156   BILITOT <0.30 02/01/2015 0938   BILITOT 0.5 10/29/2007 2156     Lab Results  Component Value Date   WBC 15.4* 02/15/2015   HGB 11.1* 02/15/2015   HCT 34.2* 02/15/2015   MCV 97.7 02/15/2015   PLT 184 02/15/2015   NEUTROABS 12.1* 02/15/2015   ASSESSMENT & PLAN:  Breast cancer of upper-outer quadrant of left female breast ([place]) Left breast biopsy 12/26/14: inflammatory breast cancer Invasive ductal carcinoma with lymphovascular invasion, 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  Treatment Plan: 1. Neoadjuvant chemotherapy with dose dense Adriamycin and Cytoxan 4 followed by Taxol and carboplatin 12 weekly 2. Followed by Mastectomy 3. Followed  by Adjuvant XRT Patient wlll need Genetic counseling Ct scans and bone scans: do not show evidence of metastatic disease ------------------------------------------------------------------------------------------------------------------------------------------------------ Current treatment: Cycle 3 day 1 neoadjuvant dose dense Adriamycin and Cytoxan Chemo Toxicities: 1. Nausea 2-3 days after chemotherapy but she did not become her nausea prescription and hence she did not take anything for it. 2. Depression: With the loss of hair she has been very tearful. She is currently on Paxil and also take Xanax. 3. Alopecia 4. Bone pain related to Neulasta: I encouraged her to use Claritin  monitoring closely for toxicities Pain related to inflammatory breast cancer: Much improved with pain medication. RTC in 2 weeks for Cycle 4   No orders of the defined types were placed in this encounter.   The patient has a  good understanding of the overall plan. she agrees with it. she will call with any problems that may develop before the next visit here.   Rulon Eisenmenger, MD 02/15/2015

## 2015-02-19 ENCOUNTER — Telehealth: Payer: Self-pay

## 2015-02-19 NOTE — Telephone Encounter (Signed)
LMOVM - Pt to return call to clinic and ask for desk nurse.

## 2015-02-26 ENCOUNTER — Other Ambulatory Visit: Payer: Self-pay

## 2015-02-26 DIAGNOSIS — C50412 Malignant neoplasm of upper-outer quadrant of left female breast: Secondary | ICD-10-CM

## 2015-02-26 MED ORDER — OXYCODONE-ACETAMINOPHEN 5-325 MG PO TABS: 1.0000 | ORAL_TABLET | Freq: Three times a day (TID) | ORAL | Status: DC | PRN

## 2015-02-26 NOTE — Telephone Encounter (Signed)
Left msg with Mr. Kennon Rounds - Rx ready and can be picked up betwenn 9 am and 4 pm.  He voiced understanding.

## 2015-02-28 NOTE — Assessment & Plan Note (Signed)
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  Treatment Plan: 1. Neoadjuvant chemotherapy with dose dense Adriamycin and Cytoxan 4 followed by Taxol and carboplatin 12 weekly 2. Followed by Mastectomy 3. Followed by Adjuvant XRT Patient wlll need Genetic counseling Ct scans and bone scans: do not show evidence of metastatic disease ------------------------------------------------------------------------------------------------------------------------------------------------------ Current treatment: Cycle 4 day 1 neoadjuvant dose dense Adriamycin and Cytoxan Chemo Toxicities: 1. Nausea 2-3 days after chemotherapy but she did not become her nausea prescription and hence she did not take anything for it. 2. Depression: With the loss of hair she has been very tearful. She is currently on Paxil and also take Xanax. 3. Alopecia 4. Bone pain related to Neulasta: I encouraged her to use Claritin  monitoring closely for toxicities Pain related to inflammatory breast cancer: Much improved with pain medication. RTC in 2 weeks for Cycle 1 Taxol and carboplatin

## 2015-03-01 ENCOUNTER — Ambulatory Visit (HOSPITAL_BASED_OUTPATIENT_CLINIC_OR_DEPARTMENT_OTHER): Payer: Medicare Other | Admitting: Hematology and Oncology

## 2015-03-01 ENCOUNTER — Encounter: Payer: Self-pay | Admitting: Hematology and Oncology

## 2015-03-01 ENCOUNTER — Ambulatory Visit (HOSPITAL_BASED_OUTPATIENT_CLINIC_OR_DEPARTMENT_OTHER): Payer: Medicare Other

## 2015-03-01 ENCOUNTER — Encounter: Payer: Self-pay | Admitting: *Deleted

## 2015-03-01 ENCOUNTER — Telehealth: Payer: Self-pay | Admitting: Hematology and Oncology

## 2015-03-01 ENCOUNTER — Other Ambulatory Visit (HOSPITAL_BASED_OUTPATIENT_CLINIC_OR_DEPARTMENT_OTHER): Payer: Medicare Other

## 2015-03-01 VITALS — BP 124/71 | HR 82 | Temp 98.1°F | Resp 18 | Ht 67.5 in | Wt 206.2 lb

## 2015-03-01 DIAGNOSIS — C773 Secondary and unspecified malignant neoplasm of axilla and upper limb lymph nodes: Secondary | ICD-10-CM

## 2015-03-01 DIAGNOSIS — L659 Nonscarring hair loss, unspecified: Secondary | ICD-10-CM | POA: Diagnosis not present

## 2015-03-01 DIAGNOSIS — C50412 Malignant neoplasm of upper-outer quadrant of left female breast: Secondary | ICD-10-CM

## 2015-03-01 DIAGNOSIS — M898X9 Other specified disorders of bone, unspecified site: Secondary | ICD-10-CM

## 2015-03-01 DIAGNOSIS — R110 Nausea: Secondary | ICD-10-CM

## 2015-03-01 DIAGNOSIS — F329 Major depressive disorder, single episode, unspecified: Secondary | ICD-10-CM | POA: Diagnosis not present

## 2015-03-01 DIAGNOSIS — Z5111 Encounter for antineoplastic chemotherapy: Secondary | ICD-10-CM | POA: Diagnosis present

## 2015-03-01 LAB — CBC WITH DIFFERENTIAL/PLATELET
BASO%: 0.4 % (ref 0.0–2.0)
Basophils Absolute: 0 10*3/uL (ref 0.0–0.1)
EOS ABS: 0.1 10*3/uL (ref 0.0–0.5)
EOS%: 1.3 % (ref 0.0–7.0)
HCT: 30.8 % — ABNORMAL LOW (ref 34.8–46.6)
HGB: 10.2 g/dL — ABNORMAL LOW (ref 11.6–15.9)
LYMPH%: 9.4 % — AB (ref 14.0–49.7)
MCH: 31.9 pg (ref 25.1–34.0)
MCHC: 33.1 g/dL (ref 31.5–36.0)
MCV: 96.4 fL (ref 79.5–101.0)
MONO#: 1.3 10*3/uL — ABNORMAL HIGH (ref 0.1–0.9)
MONO%: 13.7 % (ref 0.0–14.0)
NEUT%: 75.2 % (ref 38.4–76.8)
NEUTROS ABS: 6.9 10*3/uL — AB (ref 1.5–6.5)
Platelets: 251 10*3/uL (ref 145–400)
RBC: 3.2 10*6/uL — AB (ref 3.70–5.45)
RDW: 13.2 % (ref 11.2–14.5)
WBC: 9.2 10*3/uL (ref 3.9–10.3)
lymph#: 0.9 10*3/uL (ref 0.9–3.3)

## 2015-03-01 LAB — COMPREHENSIVE METABOLIC PANEL
ALT: <9 U/L (ref 0–55)
AST: 10 U/L (ref 5–34)
Albumin: 3.5 g/dL (ref 3.5–5.0)
Alkaline Phosphatase: 78 U/L (ref 40–150)
Anion Gap: 8 mEq/L (ref 3–11)
BUN: 8.6 mg/dL (ref 7.0–26.0)
CO2: 27 meq/L (ref 22–29)
Calcium: 8.6 mg/dL (ref 8.4–10.4)
Chloride: 110 mEq/L — ABNORMAL HIGH (ref 98–109)
Creatinine: 0.8 mg/dL (ref 0.6–1.1)
GLUCOSE: 109 mg/dL (ref 70–140)
POTASSIUM: 3.6 meq/L (ref 3.5–5.1)
SODIUM: 145 meq/L (ref 136–145)
TOTAL PROTEIN: 6.4 g/dL (ref 6.4–8.3)

## 2015-03-01 MED ORDER — SODIUM CHLORIDE 0.9 % IV SOLN
Freq: Once | INTRAVENOUS | Status: AC
  Administered 2015-03-01: 12:00:00 via INTRAVENOUS

## 2015-03-01 MED ORDER — SODIUM CHLORIDE 0.9 % IV SOLN
Freq: Once | INTRAVENOUS | Status: AC
  Administered 2015-03-01: 12:00:00 via INTRAVENOUS
  Filled 2015-03-01: qty 5

## 2015-03-01 MED ORDER — HEPARIN SOD (PORK) LOCK FLUSH 100 UNIT/ML IV SOLN
500.0000 [IU] | Freq: Once | INTRAVENOUS | Status: AC | PRN
  Administered 2015-03-01: 500 [IU]
  Filled 2015-03-01: qty 5

## 2015-03-01 MED ORDER — PEGFILGRASTIM 6 MG/0.6ML ~~LOC~~ PSKT
6.0000 mg | PREFILLED_SYRINGE | Freq: Once | SUBCUTANEOUS | Status: AC
  Administered 2015-03-01: 6 mg via SUBCUTANEOUS
  Filled 2015-03-01: qty 0.6

## 2015-03-01 MED ORDER — SODIUM CHLORIDE 0.9 % IV SOLN
600.0000 mg/m2 | Freq: Once | INTRAVENOUS | Status: AC
  Administered 2015-03-01: 1280 mg via INTRAVENOUS
  Filled 2015-03-01: qty 64

## 2015-03-01 MED ORDER — DOXORUBICIN HCL CHEMO IV INJECTION 2 MG/ML
60.0000 mg/m2 | Freq: Once | INTRAVENOUS | Status: AC
  Administered 2015-03-01: 128 mg via INTRAVENOUS
  Filled 2015-03-01: qty 64

## 2015-03-01 MED ORDER — PALONOSETRON HCL INJECTION 0.25 MG/5ML
INTRAVENOUS | Status: AC
  Filled 2015-03-01: qty 5

## 2015-03-01 MED ORDER — SODIUM CHLORIDE 0.9 % IJ SOLN
10.0000 mL | INTRAMUSCULAR | Status: DC | PRN
  Administered 2015-03-01: 10 mL
  Filled 2015-03-01: qty 10

## 2015-03-01 MED ORDER — PALONOSETRON HCL INJECTION 0.25 MG/5ML
0.2500 mg | Freq: Once | INTRAVENOUS | Status: AC
  Administered 2015-03-01: 0.25 mg via INTRAVENOUS

## 2015-03-01 NOTE — Telephone Encounter (Signed)
Gave patient avs report and appointments for March. Per 2/24 pof lab/tx (abraxan/carbo) 2 weeks - f/u with HB 3 weeks. No care plan at this time.

## 2015-03-01 NOTE — Patient Instructions (Signed)
[facility] Discharge Instructions for Patients Receiving Chemotherapy  Today you received the following chemotherapy agents: Adriamycin and Cytoxan.   To help prevent nausea and vomiting after your treatment, we encourage you to take your nausea medication: Decadron. Take one twice daily. Begin the morning of 02/16/15. Take Compazine every 6 hours as needed. For nausea on or after 2/13, you make take Zofran every 8 hours as needed.   If you develop nausea and vomiting that is not controlled by your nausea medication, call the clinic.   BELOW ARE SYMPTOMS THAT SHOULD BE REPORTED IMMEDIATELY:  *FEVER GREATER THAN 100.5 F  *CHILLS WITH OR WITHOUT FEVER  NAUSEA AND VOMITING THAT IS NOT CONTROLLED WITH YOUR NAUSEA MEDICATION  *UNUSUAL SHORTNESS OF BREATH  *UNUSUAL BRUISING OR BLEEDING  TENDERNESS IN MOUTH AND THROAT WITH OR WITHOUT PRESENCE OF ULCERS  *URINARY PROBLEMS  *BOWEL PROBLEMS  UNUSUAL RASH Items with * indicate a potential emergency and should be followed up as soon as possible.  Feel free to call the clinic should you have any questions or concerns. The clinic phone number is [phone].  Please show the [place] at check-in to the Emergency Department and triage nurse.

## 2015-03-01 NOTE — Progress Notes (Signed)
Unable to get in to exam room prior to MD.  No assessment performed.  

## 2015-03-01 NOTE — Progress Notes (Signed)
Pt c/o cough that has been persistent. Per Dr. Lindi Adie pt to take Robitussin DM as directed. Gave instructions to May, RN to give to pt while in infusion.

## 2015-03-01 NOTE — Progress Notes (Signed)
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 She has received 3 cycles of chemotherapy with Adriamycin and Cytoxan today is cycle #4. She complained of nausea and dry heaves. She was prescribed Prilosec which helped her mildly. She continues to have mild nausea.  REVIEW OF SYSTEMS:   Constitutional: Denies fevers, chills or abnormal weight loss Eyes: Denies blurriness of vision Ears, nose, mouth, throat, and face: Denies mucositis or sore throat Respiratory: Denies cough, dyspnea or wheezes Cardiovascular: Denies palpitation, chest discomfort Gastrointestinal:  Nausea and dry heaves. Skin: Denies abnormal skin rashes Lymphatics: Denies new lymphadenopathy or easy bruising Neurological:Denies numbness, tingling or new weaknesses Behavioral/Psych: Mood is stable, no new changes  Extremities: No lower extremity edema Breast:  denies any pain or lumps or nodules in either breasts All other systems were reviewed with the patient and are negative.  I have reviewed the past medical  history, past surgical history, social history and family history with the patient and they are unchanged from previous note.  ALLERGIES:  has No Known Allergies.  MEDICATIONS:  Current Outpatient Prescriptions  Medication Sig Dispense Refill  . albuterol (PROVENTIL) (2.5 MG/3ML) 0.083% nebulizer solution Take 3 mLs (2.5 mg total) by nebulization every 4 (four) hours as needed for wheezing or shortness of breath. 75 mL 12  . ALPRAZolam (XANAX) 0.5 MG tablet Take 0.5 mg by mouth 3 (three) times daily as needed for anxiety or sleep.    [name] amLODipine (NORVASC) 5 MG tablet Take 5 mg by mouth 2 (two) times daily.    [name] amoxicillin (AMOXIL) 500 MG tablet Take 1 tablet (500 mg total) by mouth 2 (two) times daily. 14 tablet 0  . chlorpheniramine-HYDROcodone (TUSSIONEX) 10-8 MG/5ML SUER Take 5 mLs by mouth 2 (two) times daily. 140 mL 0  . clindamycin (CLEOCIN) 300 MG capsule Take 300 mg by mouth 4 (four) times daily. X 7 days    . dexamethasone (DECADRON) 4 MG tablet Take 1 tablet (4 mg total) by mouth 2 (two) times daily. Take 2 tablets by mouth once a day on the day after chemotherapy and then take 2 tablets two times a day for 2 days. Take with food. 30 tablet 1  . ibuprofen (ADVIL,MOTRIN) 200 MG tablet Take 200 mg by mouth every 6 (six) hours as needed.    . lidocaine-prilocaine (EMLA) cream Apply to affected area once 30 g 3  . LORazepam (ATIVAN) 0.5 MG tablet Take 1 tablet (0.5 mg total) by mouth at bedtime. 30 tablet 0  . metoprolol (LOPRESSOR) 50 MG tablet Take 0.5 tablets (25 mg total) by mouth 2 (two) times daily. (Patient taking differently:  Take 50 mg by mouth 2 (two) times daily. )    . ondansetron (ZOFRAN) 8 MG tablet Take 1 tablet (8 mg total) by mouth 2 (two) times daily as needed. Start on the third day after chemotherapy. 30 tablet 1  . oxyCODONE-acetaminophen (PERCOCET/ROXICET) 5-325 MG tablet Take 1 tablet by mouth every 8 (eight) hours as needed for severe pain. 60 tablet 0  .  PARoxetine (PAXIL) 20 MG tablet Take 20 mg by mouth 2 (two) times daily.     . prochlorperazine (COMPAZINE) 10 MG tablet Take 1 tablet (10 mg total) by mouth every 6 (six) hours as needed (Nausea or vomiting). 30 tablet 1   No current facility-administered medications for this visit.    PHYSICAL EXAMINATION: ECOG PERFORMANCE STATUS: 1 - Symptomatic but completely ambulatory  Filed Vitals:   03/01/15 0945  BP: 124/71  Pulse: 82  Temp: 98.1 F (36.7 C)  Resp: 18   Filed Weights   03/01/15 0945  Weight: 206 lb 3.2 oz (93.532 kg)    GENERAL:alert, no distress and comfortable SKIN: skin color, texture, turgor are normal, no rashes or significant lesions EYES: normal, Conjunctiva are pink and non-injected, sclera clear OROPHARYNX:no exudate, no erythema and lips, buccal mucosa, and tongue normal  NECK: supple, thyroid normal size, non-tender, without nodularity LYMPH:  no palpable lymphadenopathy in the cervical, axillary or inguinal LUNGS: clear to auscultation and percussion with normal breathing effort HEART: regular rate & rhythm and no murmurs and no lower extremity edema ABDOMEN:abdomen soft, non-tender and normal bowel sounds MUSCULOSKELETAL:no cyanosis of digits and no clubbing  NEURO: alert & oriented x 3 with fluent speech, no focal motor/sensory deficits EXTREMITIES: No lower extremity edema  LABORATORY DATA:  I have reviewed the data as listed   Chemistry      Component Value Date/Time   NA 143 02/15/2015 0918   NA 143 01/17/2015 1228   K 3.5 02/15/2015 0918   K 3.9 01/17/2015 1228   CL 106 01/17/2015 1228   CO2 24 02/15/2015 0918   CO2 26 01/17/2015 1228   BUN 7.2 02/15/2015 0918   BUN 15 01/17/2015 1228   CREATININE 0.8 02/15/2015 0918   CREATININE 0.78 01/17/2015 1228      Component Value Date/Time   CALCIUM 8.9 02/15/2015 0918   CALCIUM 9.5 01/17/2015 1228   ALKPHOS 99 02/15/2015 0918   ALKPHOS 55 10/29/2007 2156   AST 15 02/15/2015 0918   AST 18  10/29/2007 2156   ALT 10 02/15/2015 0918   ALT 12 10/29/2007 2156   BILITOT <0.30 02/15/2015 0918   BILITOT 0.5 10/29/2007 2156       Lab Results  Component Value Date   WBC 9.2 03/01/2015   HGB 10.2* 03/01/2015   HCT 30.8* 03/01/2015   MCV 96.4 03/01/2015   PLT 251 03/01/2015   NEUTROABS 6.9* 03/01/2015     ASSESSMENT & PLAN:  Breast cancer of upper-outer quadrant of left female breast ([place]) Left breast biopsy 12/26/14: inflammatory breast cancer Invasive ductal carcinoma with lymphovascular invasion, 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  Treatment Plan: 1. Neoadjuvant chemotherapy with dose dense Adriamycin and Cytoxan 4 followed by Taxol and carboplatin 12 weekly 2. Followed by Mastectomy 3. Followed by Adjuvant XRT Patient wlll need Genetic counseling  Ct scans and bone scans: do not show evidence of metastatic disease ------------------------------------------------------------------------------------------------------------------------------------------------------ Current treatment: Cycle 4 day 1 neoadjuvant dose dense Adriamycin and Cytoxan Chemo Toxicities: 1. Nausea 2-3 days after chemotherapy I encouraged her to take the prescription for nausea medication that she currently has been 2. Depression: With the loss of hair she has been very tearful. She is currently on Paxil and also take Xanax. 3. Alopecia 4. Bone pain related to Neulasta: Currently on Claritin  monitoring closely for toxicities Pain related to inflammatory breast cancer: Much improved with pain medication. Occasionally still has sharp pain RTC in 2 weeks for Cycle 1 Taxol and carboplatin Patient will follow with Heather in 3 weeks and I will see her back on 04/05/2015  No orders of the  defined types were placed in this encounter.   The patient has a good understanding of the overall plan. she agrees with it. she will call with any problems that may develop before the next visit here.   Rulon Eisenmenger, MD 03/01/2015     g

## 2015-03-04 ENCOUNTER — Other Ambulatory Visit: Payer: Self-pay | Admitting: Hematology and Oncology

## 2015-03-14 ENCOUNTER — Other Ambulatory Visit: Payer: Self-pay

## 2015-03-14 DIAGNOSIS — C50412 Malignant neoplasm of upper-outer quadrant of left female breast: Secondary | ICD-10-CM

## 2015-03-15 ENCOUNTER — Ambulatory Visit (HOSPITAL_BASED_OUTPATIENT_CLINIC_OR_DEPARTMENT_OTHER): Payer: Medicare Other

## 2015-03-15 ENCOUNTER — Other Ambulatory Visit (HOSPITAL_BASED_OUTPATIENT_CLINIC_OR_DEPARTMENT_OTHER): Payer: Medicare Other

## 2015-03-15 VITALS — BP 147/85 | HR 86 | Temp 98.4°F | Resp 18

## 2015-03-15 DIAGNOSIS — Z5111 Encounter for antineoplastic chemotherapy: Secondary | ICD-10-CM | POA: Diagnosis not present

## 2015-03-15 DIAGNOSIS — C50412 Malignant neoplasm of upper-outer quadrant of left female breast: Secondary | ICD-10-CM | POA: Diagnosis not present

## 2015-03-15 LAB — CBC WITH DIFFERENTIAL/PLATELET
BASO%: 0.5 % (ref 0.0–2.0)
Basophils Absolute: 0.1 10*3/uL (ref 0.0–0.1)
EOS%: 0.9 % (ref 0.0–7.0)
Eosinophils Absolute: 0.1 10*3/uL (ref 0.0–0.5)
HCT: 28.7 % — ABNORMAL LOW (ref 34.8–46.6)
HGB: 9.4 g/dL — ABNORMAL LOW (ref 11.6–15.9)
LYMPH#: 0.9 10*3/uL (ref 0.9–3.3)
LYMPH%: 7 % — AB (ref 14.0–49.7)
MCH: 32 pg (ref 25.1–34.0)
MCHC: 32.9 g/dL (ref 31.5–36.0)
MCV: 97.2 fL (ref 79.5–101.0)
MONO#: 1.5 10*3/uL — AB (ref 0.1–0.9)
MONO%: 11.7 % (ref 0.0–14.0)
NEUT%: 79.9 % — AB (ref 38.4–76.8)
NEUTROS ABS: 10.2 10*3/uL — AB (ref 1.5–6.5)
Platelets: 164 10*3/uL (ref 145–400)
RBC: 2.95 10*6/uL — AB (ref 3.70–5.45)
RDW: 14.1 % (ref 11.2–14.5)
WBC: 12.7 10*3/uL — AB (ref 3.9–10.3)

## 2015-03-15 LAB — COMPREHENSIVE METABOLIC PANEL
AST: 11 U/L (ref 5–34)
Albumin: 3.7 g/dL (ref 3.5–5.0)
Alkaline Phosphatase: 97 U/L (ref 40–150)
Anion Gap: 8 mEq/L (ref 3–11)
BUN: 9.2 mg/dL (ref 7.0–26.0)
CHLORIDE: 109 meq/L (ref 98–109)
CO2: 27 meq/L (ref 22–29)
CREATININE: 0.8 mg/dL (ref 0.6–1.1)
Calcium: 8.9 mg/dL (ref 8.4–10.4)
EGFR: >90 mL/min/{1.73_m2} (ref >90)
GLUCOSE: 107 mg/dL (ref 70–140)
Potassium: 4.3 mEq/L (ref 3.5–5.1)
Sodium: 144 mEq/L (ref 136–145)
TOTAL PROTEIN: 6.6 g/dL (ref 6.4–8.3)

## 2015-03-15 MED ORDER — DIPHENHYDRAMINE HCL 50 MG/ML IJ SOLN
INTRAMUSCULAR | Status: AC
  Filled 2015-03-15: qty 1

## 2015-03-15 MED ORDER — HEPARIN SOD (PORK) LOCK FLUSH 100 UNIT/ML IV SOLN
500.0000 [IU] | Freq: Once | INTRAVENOUS | Status: AC | PRN
  Administered 2015-03-15: 500 [IU]
  Filled 2015-03-15: qty 5

## 2015-03-15 MED ORDER — FAMOTIDINE IN NACL 20-0.9 MG/50ML-% IV SOLN
INTRAVENOUS | Status: AC
  Filled 2015-03-15: qty 50

## 2015-03-15 MED ORDER — FAMOTIDINE IN NACL 20-0.9 MG/50ML-% IV SOLN
20.0000 mg | Freq: Once | INTRAVENOUS | Status: AC
  Administered 2015-03-15: 20 mg via INTRAVENOUS

## 2015-03-15 MED ORDER — CARBOPLATIN CHEMO INJECTION 450 MG/45ML
279.2000 mg | Freq: Once | INTRAVENOUS | Status: AC
  Administered 2015-03-15: 280 mg via INTRAVENOUS
  Filled 2015-03-15: qty 28

## 2015-03-15 MED ORDER — SODIUM CHLORIDE 0.9 % IV SOLN
Freq: Once | INTRAVENOUS | Status: AC
  Administered 2015-03-15: 12:00:00 via INTRAVENOUS

## 2015-03-15 MED ORDER — DIPHENHYDRAMINE HCL 50 MG/ML IJ SOLN
25.0000 mg | Freq: Once | INTRAMUSCULAR | Status: AC
  Administered 2015-03-15: 25 mg via INTRAVENOUS

## 2015-03-15 MED ORDER — SODIUM CHLORIDE 0.9 % IJ SOLN
10.0000 mL | INTRAMUSCULAR | Status: DC | PRN
  Administered 2015-03-15: 10 mL
  Filled 2015-03-15: qty 10

## 2015-03-15 MED ORDER — PACLITAXEL CHEMO INJECTION 300 MG/50ML
80.0000 mg/m2 | Freq: Once | INTRAVENOUS | Status: AC
  Administered 2015-03-15: 168 mg via INTRAVENOUS
  Filled 2015-03-15: qty 28

## 2015-03-15 MED ORDER — SODIUM CHLORIDE 0.9 % IV SOLN
Freq: Once | INTRAVENOUS | Status: AC
  Administered 2015-03-15: 12:00:00 via INTRAVENOUS
  Filled 2015-03-15: qty 8

## 2015-03-15 NOTE — Patient Instructions (Signed)

## 2015-03-15 NOTE — Progress Notes (Signed)
1430- Pt tolerated first time taxol/carboplatin without any issues. Follow up call for Monday placed in the in basket. Pt reports that she did not have any more refills on her zofran/compazine nausea medication and will need a script. Notified Dr. Geralyn Flash nurse and will refill it for today. Notified [facility] pharmacy to look out for it, since pt will be picking it up today.  VSS upon discharge. AVS printed.

## 2015-03-19 ENCOUNTER — Encounter: Payer: Self-pay | Admitting: *Deleted

## 2015-03-19 ENCOUNTER — Telehealth: Payer: Self-pay | Admitting: Nurse Practitioner

## 2015-03-19 NOTE — Telephone Encounter (Signed)
per pof (Dawn)to sch TC after MD appt-appt sch pt aware

## 2015-03-22 ENCOUNTER — Encounter: Payer: Self-pay | Admitting: *Deleted

## 2015-03-22 ENCOUNTER — Encounter: Payer: Self-pay | Admitting: Nurse Practitioner

## 2015-03-22 ENCOUNTER — Other Ambulatory Visit: Payer: Self-pay | Admitting: *Deleted

## 2015-03-22 ENCOUNTER — Telehealth: Payer: Self-pay | Admitting: Nurse Practitioner

## 2015-03-22 ENCOUNTER — Ambulatory Visit (HOSPITAL_BASED_OUTPATIENT_CLINIC_OR_DEPARTMENT_OTHER): Payer: Medicare Other

## 2015-03-22 ENCOUNTER — Ambulatory Visit (HOSPITAL_BASED_OUTPATIENT_CLINIC_OR_DEPARTMENT_OTHER): Payer: Medicare Other | Admitting: Nurse Practitioner

## 2015-03-22 ENCOUNTER — Other Ambulatory Visit (HOSPITAL_BASED_OUTPATIENT_CLINIC_OR_DEPARTMENT_OTHER): Payer: Medicare Other

## 2015-03-22 VITALS — BP 113/73 | HR 80 | Temp 98.1°F | Resp 18 | Ht 67.5 in | Wt 196.5 lb

## 2015-03-22 DIAGNOSIS — C50412 Malignant neoplasm of upper-outer quadrant of left female breast: Secondary | ICD-10-CM

## 2015-03-22 DIAGNOSIS — R05 Cough: Secondary | ICD-10-CM

## 2015-03-22 DIAGNOSIS — G893 Neoplasm related pain (acute) (chronic): Secondary | ICD-10-CM | POA: Diagnosis not present

## 2015-03-22 DIAGNOSIS — Z5111 Encounter for antineoplastic chemotherapy: Secondary | ICD-10-CM

## 2015-03-22 DIAGNOSIS — D6481 Anemia due to antineoplastic chemotherapy: Secondary | ICD-10-CM | POA: Diagnosis not present

## 2015-03-22 DIAGNOSIS — F419 Anxiety disorder, unspecified: Secondary | ICD-10-CM | POA: Diagnosis not present

## 2015-03-22 DIAGNOSIS — R630 Anorexia: Secondary | ICD-10-CM

## 2015-03-22 DIAGNOSIS — T451X5A Adverse effect of antineoplastic and immunosuppressive drugs, initial encounter: Secondary | ICD-10-CM

## 2015-03-22 LAB — COMPREHENSIVE METABOLIC PANEL
ANION GAP: 7 meq/L (ref 3–11)
AST: 12 U/L (ref 5–34)
Albumin: 3.8 g/dL (ref 3.5–5.0)
Alkaline Phosphatase: 67 U/L (ref 40–150)
BUN: 15.7 mg/dL (ref 7.0–26.0)
CO2: 26 meq/L (ref 22–29)
CREATININE: 0.9 mg/dL (ref 0.6–1.1)
Calcium: 9.2 mg/dL (ref 8.4–10.4)
Chloride: 109 mEq/L (ref 98–109)
EGFR: 84 mL/min/{1.73_m2} — ABNORMAL LOW (ref >90)
Glucose: 103 mg/dl (ref 70–140)
Potassium: 4.4 mEq/L (ref 3.5–5.1)
Sodium: 142 mEq/L (ref 136–145)
TOTAL PROTEIN: 6.9 g/dL (ref 6.4–8.3)

## 2015-03-22 LAB — CBC WITH DIFFERENTIAL/PLATELET
BASO%: 1.2 % (ref 0.0–2.0)
Basophils Absolute: 0.1 10*3/uL (ref 0.0–0.1)
EOS%: 2.3 % (ref 0.0–7.0)
Eosinophils Absolute: 0.1 10*3/uL (ref 0.0–0.5)
HEMATOCRIT: 27.8 % — AB (ref 34.8–46.6)
HGB: 9.1 g/dL — ABNORMAL LOW (ref 11.6–15.9)
LYMPH#: 0.7 10*3/uL — AB (ref 0.9–3.3)
LYMPH%: 14.7 % (ref 14.0–49.7)
MCH: 31.7 pg (ref 25.1–34.0)
MCHC: 32.9 g/dL (ref 31.5–36.0)
MCV: 96.4 fL (ref 79.5–101.0)
MONO#: 0.5 10*3/uL (ref 0.1–0.9)
MONO%: 10.8 % (ref 0.0–14.0)
NEUT%: 71 % (ref 38.4–76.8)
NEUTROS ABS: 3.3 10*3/uL (ref 1.5–6.5)
PLATELETS: 247 10*3/uL (ref 145–400)
RBC: 2.88 10*6/uL — AB (ref 3.70–5.45)
RDW: 14.8 % — ABNORMAL HIGH (ref 11.2–14.5)
WBC: 4.6 10*3/uL (ref 3.9–10.3)

## 2015-03-22 MED ORDER — OXYCODONE-ACETAMINOPHEN 5-325 MG PO TABS: 1.0000 | ORAL_TABLET | Freq: Three times a day (TID) | ORAL | Status: DC | PRN

## 2015-03-22 MED ORDER — FAMOTIDINE IN NACL 20-0.9 MG/50ML-% IV SOLN
20.0000 mg | Freq: Once | INTRAVENOUS | Status: AC
  Administered 2015-03-22: 20 mg via INTRAVENOUS

## 2015-03-22 MED ORDER — SODIUM CHLORIDE 0.9 % IV SOLN
Freq: Once | INTRAVENOUS | Status: AC
  Administered 2015-03-22: 13:00:00 via INTRAVENOUS

## 2015-03-22 MED ORDER — DEXTROSE 5 % IV SOLN
80.0000 mg/m2 | Freq: Once | INTRAVENOUS | Status: AC
  Administered 2015-03-22: 168 mg via INTRAVENOUS
  Filled 2015-03-22: qty 28

## 2015-03-22 MED ORDER — HEPARIN SOD (PORK) LOCK FLUSH 100 UNIT/ML IV SOLN
500.0000 [IU] | Freq: Once | INTRAVENOUS | Status: AC | PRN
  Administered 2015-03-22: 500 [IU]
  Filled 2015-03-22: qty 5

## 2015-03-22 MED ORDER — LORAZEPAM 0.5 MG PO TABS: 0.5000 mg | ORAL_TABLET | Freq: Every day | ORAL | Status: DC

## 2015-03-22 MED ORDER — SODIUM CHLORIDE 0.9 % IV SOLN
253.8000 mg | Freq: Once | INTRAVENOUS | Status: AC
  Administered 2015-03-22: 250 mg via INTRAVENOUS
  Filled 2015-03-22: qty 25

## 2015-03-22 MED ORDER — DIPHENHYDRAMINE HCL 50 MG/ML IJ SOLN
INTRAMUSCULAR | Status: AC
  Filled 2015-03-22: qty 1

## 2015-03-22 MED ORDER — FAMOTIDINE IN NACL 20-0.9 MG/50ML-% IV SOLN
INTRAVENOUS | Status: AC
  Filled 2015-03-22: qty 50

## 2015-03-22 MED ORDER — DIPHENHYDRAMINE HCL 50 MG/ML IJ SOLN
25.0000 mg | Freq: Once | INTRAMUSCULAR | Status: AC
  Administered 2015-03-22: 25 mg via INTRAVENOUS

## 2015-03-22 MED ORDER — SODIUM CHLORIDE 0.9 % IJ SOLN
10.0000 mL | INTRAMUSCULAR | Status: DC | PRN
  Administered 2015-03-22: 10 mL
  Filled 2015-03-22: qty 10

## 2015-03-22 MED ORDER — SODIUM CHLORIDE 0.9 % IV SOLN
Freq: Once | INTRAVENOUS | Status: AC
  Administered 2015-03-22: 13:00:00 via INTRAVENOUS
  Filled 2015-03-22: qty 8

## 2015-03-22 NOTE — Progress Notes (Signed)
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 She is now on to the next regimen of her neoadjuvant treatment, weekly taxol and carboplatin. She tolerated the first cycle well. She had less nausea. She is moving her bowels well. She is down almost 10lb since her last visit however. Her appetite is down secondary to taste changes. She does not drink enough water. She is fatigued. She continues to have aches to several body parts including lower abdomen and lower back. She was recently treated for bronchitis. She has finished this course of antibiotics but still has a dry cough.  REVIEW OF SYSTEMS:   Constitutional: Denies fevers, chills or abnormal weight loss Eyes: Denies blurriness of vision Ears, nose, mouth, throat, and face: Denies mucositis or sore throat Respiratory: Denies cough, dyspnea or wheezes Cardiovascular: Denies palpitation, chest discomfort Gastrointestinal:  Nausea and dry heaves. Skin: Denies abnormal skin rashes Lymphatics: Denies new lymphadenopathy or easy  bruising Neurological:Denies numbness, tingling or new weaknesses Behavioral/Psych: Mood is stable, no new changes  Extremities: No lower extremity edema Breast:  denies any pain or lumps or nodules in either breasts All other systems were reviewed with the patient and are negative.  I have reviewed the past medical history, past surgical history, social history and family history with the patient and they are unchanged from previous note.  ALLERGIES:  has No Known Allergies.  MEDICATIONS:  Current Outpatient Prescriptions  Medication Sig Dispense Refill  . ALPRAZolam (XANAX) 0.5 MG tablet Take 0.5 mg by mouth 3 (three) times daily as needed for anxiety or sleep.    [name] amLODipine (NORVASC) 5 MG tablet Take 5 mg by mouth 2 (two) times daily.    . metoprolol (LOPRESSOR) 50 MG tablet Take 0.5 tablets (25 mg total) by mouth 2 (two) times daily. (Patient taking differently: Take 50 mg by mouth 2 (two) times daily. )    . PARoxetine (PAXIL) 20 MG tablet Take 20 mg by mouth 2 (two) times daily.     [name] albuterol (PROVENTIL) (2.5 MG/3ML) 0.083% nebulizer solution Take 3 mLs (2.5 mg total) by nebulization every 4 (four) hours as needed for wheezing or shortness of breath. (Patient not taking: Reported on 03/22/2015) 75 mL 12  . ibuprofen (ADVIL,MOTRIN) 200 MG tablet Take 200 mg by mouth every 6 (six) hours as needed. Reported on 03/22/2015    . LORazepam (ATIVAN) 0.5 MG tablet Take 1 tablet (0.5 mg total) by mouth at bedtime. 30 tablet 0  . oxyCODONE-acetaminophen (PERCOCET/ROXICET) 5-325 MG tablet Take 1 tablet by mouth every 8 (eight) hours as needed for severe pain. 60 tablet 0   No current facility-administered  medications for this visit.    PHYSICAL EXAMINATION: ECOG PERFORMANCE STATUS: 1 - Symptomatic but completely ambulatory  Filed Vitals:   03/22/15 1130  BP: 113/73  Pulse: 80  Temp: 98.1 F (36.7 C)  Resp: 18   Filed Weights   03/22/15 1130  Weight: 196 lb 8 oz (89.132 kg)     GENERAL:alert, no distress and comfortable SKIN: skin color, texture, turgor are normal, no rashes or significant lesions EYES: normal, Conjunctiva are pink and non-injected, sclera clear OROPHARYNX:no exudate, no erythema and lips, buccal mucosa, and tongue normal  NECK: supple, thyroid normal size, non-tender, without nodularity LYMPH:  no palpable lymphadenopathy in the cervical, axillary or inguinal LUNGS: clear to auscultation and percussion with normal breathing effort HEART: regular rate & rhythm and no murmurs and no lower extremity edema ABDOMEN:abdomen soft, non-tender and normal bowel sounds MUSCULOSKELETAL:no cyanosis of digits and no clubbing  NEURO: alert & oriented x 3 with fluent speech, no focal motor/sensory deficits EXTREMITIES: No lower extremity edema  LABORATORY DATA:  I have reviewed the data as listed   Chemistry      Component Value Date/Time   NA 142 03/22/2015 1114   NA 143 01/17/2015 1228   K 4.4 03/22/2015 1114   K 3.9 01/17/2015 1228   CL 106 01/17/2015 1228   CO2 26 03/22/2015 1114   CO2 26 01/17/2015 1228   BUN 15.7 03/22/2015 1114   BUN 15 01/17/2015 1228   CREATININE 0.9 03/22/2015 1114   CREATININE 0.78 01/17/2015 1228      Component Value Date/Time   CALCIUM 9.2 03/22/2015 1114   CALCIUM 9.5 01/17/2015 1228   ALKPHOS 67 03/22/2015 1114   ALKPHOS 55 10/29/2007 2156   AST 12 03/22/2015 1114   AST 18 10/29/2007 2156   ALT <9 03/22/2015 1114   ALT 12 10/29/2007 2156   BILITOT <0.30 03/22/2015 1114   BILITOT 0.5 10/29/2007 2156       Lab Results  Component Value Date   WBC 4.6 03/22/2015   HGB 9.1* 03/22/2015   HCT 27.8* 03/22/2015   MCV 96.4 03/22/2015   PLT 247 03/22/2015   NEUTROABS 3.3 03/22/2015     ASSESSMENT & PLAN:  Breast cancer of upper-outer quadrant of left female breast ([place]) Left breast biopsy 12/26/14: inflammatory breast cancer Invasive ductal carcinoma with lymphovascular invasion, 1/1 left axillary lymph  node positive for metastatic carcinoma, grade 3, ER/PR negative, Her 2 Negative Left breast palpable mass at 2:00: 4 x 3.8 x 2.5 cm, enlarged left axillary lymph node 4 cm in size with diffuse skin thickening T4 N1 (Stage 3B).  Treatment Plan: 1. Neoadjuvant chemotherapy with dose dense Adriamycin and Cytoxan 4 followed by Taxol and carboplatin 12 weekly 2. Followed by Mastectomy 3. Followed by Adjuvant XRT Patient wlll need Genetic counseling CT scans and bone scans: do not show evidence of metastatic disease ------------------------------------------------------------------------------------------------------------------------------------------------------ Current treatment: Cycle 2 neoadjuvant taxol and carboplatin Chemo Toxicities: 1. Nausea: better on this regimen. Managed with PRN antiemetics  2. Depression: With the loss of hair she has been very tearful. She is currently on Paxil and also takes Xanax. 3. Alopecia 4. Anorexia: down 10lb. Given samples of boost shakes. Encouraged to supplement with protein shakes daily.  5. Treatment related anemia: hgb 9.1, asymptomatic beyond fatigue  monitoring closely for toxicities Pain related to inflammatory breast cancer: Much improved with pain medication. Occasionally still has sharp pain  RTC in 2 weeks for cycle 4 taxol and carboplatin  No orders  of the defined types were placed in this encounter.   The patient has a good understanding of the overall plan. she agrees with it. she will call with any problems that may develop before the next visit here.   Laurie Panda, NP 03/22/2015

## 2015-03-22 NOTE — Telephone Encounter (Signed)
appt made and avs to printed in treatment room

## 2015-03-22 NOTE — Progress Notes (Signed)
Pt was seen in office today. Wt is down about 10 lbs since last visit. Pt received samples of Boost today. Suggested that she at least try to drink 2 per day until her eating gets better. Pt's complaint is altered taste buds and says nothing tastes the same and she's not eating as well.

## 2015-03-25 MED ORDER — ACETAMINOPHEN 325 MG PO TABS
ORAL_TABLET | ORAL | Status: AC
  Filled 2015-03-25: qty 2

## 2015-03-29 ENCOUNTER — Other Ambulatory Visit (HOSPITAL_BASED_OUTPATIENT_CLINIC_OR_DEPARTMENT_OTHER): Payer: Medicare Other

## 2015-03-29 ENCOUNTER — Ambulatory Visit (HOSPITAL_BASED_OUTPATIENT_CLINIC_OR_DEPARTMENT_OTHER): Payer: Medicare Other

## 2015-03-29 VITALS — BP 107/66 | HR 89 | Temp 98.7°F | Resp 18

## 2015-03-29 DIAGNOSIS — C50412 Malignant neoplasm of upper-outer quadrant of left female breast: Secondary | ICD-10-CM | POA: Diagnosis not present

## 2015-03-29 DIAGNOSIS — Z5111 Encounter for antineoplastic chemotherapy: Secondary | ICD-10-CM

## 2015-03-29 LAB — COMPREHENSIVE METABOLIC PANEL
ALT: <9 U/L (ref 0–55)
AST: 13 U/L (ref 5–34)
Albumin: 3.6 g/dL (ref 3.5–5.0)
Alkaline Phosphatase: 63 U/L (ref 40–150)
Anion Gap: 8 mEq/L (ref 3–11)
BUN: 11.2 mg/dL (ref 7.0–26.0)
CALCIUM: 8.8 mg/dL (ref 8.4–10.4)
CHLORIDE: 111 meq/L — AB (ref 98–109)
CO2: 24 mEq/L (ref 22–29)
Creatinine: 0.8 mg/dL (ref 0.6–1.1)
EGFR: >90 mL/min/{1.73_m2} (ref >90)
GLUCOSE: 102 mg/dL (ref 70–140)
POTASSIUM: 4 meq/L (ref 3.5–5.1)
SODIUM: 142 meq/L (ref 136–145)
Total Bilirubin: <0.3 mg/dL (ref 0.20–1.20)
Total Protein: 6.6 g/dL (ref 6.4–8.3)

## 2015-03-29 LAB — CBC WITH DIFFERENTIAL/PLATELET
BASO%: 1.6 % (ref 0.0–2.0)
Basophils Absolute: 0 10*3/uL (ref 0.0–0.1)
EOS ABS: 0.1 10*3/uL (ref 0.0–0.5)
EOS%: 5.2 % (ref 0.0–7.0)
HCT: 26.1 % — ABNORMAL LOW (ref 34.8–46.6)
HGB: 8.6 g/dL — ABNORMAL LOW (ref 11.6–15.9)
LYMPH%: 26.7 % (ref 14.0–49.7)
MCH: 32.8 pg (ref 25.1–34.0)
MCHC: 33 g/dL (ref 31.5–36.0)
MCV: 99.6 fL (ref 79.5–101.0)
MONO#: 0.4 10*3/uL (ref 0.1–0.9)
MONO%: 16.3 % — AB (ref 0.0–14.0)
NEUT#: 1.3 10*3/uL — ABNORMAL LOW (ref 1.5–6.5)
NEUT%: 50.2 % (ref 38.4–76.8)
Platelets: 187 10*3/uL (ref 145–400)
RBC: 2.62 10*6/uL — AB (ref 3.70–5.45)
RDW: 16.3 % — AB (ref 11.2–14.5)
WBC: 2.5 10*3/uL — AB (ref 3.9–10.3)
lymph#: 0.7 10*3/uL — ABNORMAL LOW (ref 0.9–3.3)

## 2015-03-29 MED ORDER — SODIUM CHLORIDE 0.9 % IV SOLN
Freq: Once | INTRAVENOUS | Status: AC
  Administered 2015-03-29: 12:00:00 via INTRAVENOUS

## 2015-03-29 MED ORDER — FAMOTIDINE IN NACL 20-0.9 MG/50ML-% IV SOLN
20.0000 mg | Freq: Once | INTRAVENOUS | Status: AC
  Administered 2015-03-29: 20 mg via INTRAVENOUS

## 2015-03-29 MED ORDER — HEPARIN SOD (PORK) LOCK FLUSH 100 UNIT/ML IV SOLN
500.0000 [IU] | Freq: Once | INTRAVENOUS | Status: AC | PRN
  Administered 2015-03-29: 500 [IU]
  Filled 2015-03-29: qty 5

## 2015-03-29 MED ORDER — DIPHENHYDRAMINE HCL 50 MG/ML IJ SOLN
25.0000 mg | Freq: Once | INTRAMUSCULAR | Status: AC
  Administered 2015-03-29: 25 mg via INTRAVENOUS

## 2015-03-29 MED ORDER — FAMOTIDINE IN NACL 20-0.9 MG/50ML-% IV SOLN
INTRAVENOUS | Status: AC
  Filled 2015-03-29: qty 50

## 2015-03-29 MED ORDER — SODIUM CHLORIDE 0.9 % IV SOLN
80.0000 mg/m2 | Freq: Once | INTRAVENOUS | Status: AC
  Administered 2015-03-29: 168 mg via INTRAVENOUS
  Filled 2015-03-29: qty 28

## 2015-03-29 MED ORDER — SODIUM CHLORIDE 0.9 % IV SOLN
279.2000 mg | Freq: Once | INTRAVENOUS | Status: AC
  Administered 2015-03-29: 280 mg via INTRAVENOUS
  Filled 2015-03-29: qty 28

## 2015-03-29 MED ORDER — SODIUM CHLORIDE 0.9 % IV SOLN
Freq: Once | INTRAVENOUS | Status: AC
  Administered 2015-03-29: 12:00:00 via INTRAVENOUS
  Filled 2015-03-29: qty 8

## 2015-03-29 MED ORDER — SODIUM CHLORIDE 0.9 % IJ SOLN
10.0000 mL | INTRAMUSCULAR | Status: DC | PRN
  Administered 2015-03-29: 10 mL
  Filled 2015-03-29: qty 10

## 2015-03-29 MED ORDER — DIPHENHYDRAMINE HCL 50 MG/ML IJ SOLN
INTRAMUSCULAR | Status: AC
  Filled 2015-03-29: qty 1

## 2015-03-29 NOTE — Patient Instructions (Signed)

## 2015-03-29 NOTE — Progress Notes (Signed)
Okay to treat pt with ANC 1.3 per Dr. Gwenlyn Perking.

## 2015-03-29 NOTE — Progress Notes (Signed)
Dr. Jana Hakim notified patient's [place] of 1.3.  Taxol has been administered.  Pt is in middle of [place].  Per Dr. Jana Hakim, pt ok to discharge on completion.  No neulasta or neupogen needed. Infusion RN notified.

## 2015-04-04 NOTE — Assessment & Plan Note (Signed)
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  Treatment Plan: 1. Neoadjuvant chemotherapy with dose dense Adriamycin and Cytoxan 4 followed by Taxol and carboplatin 12 weekly 2. Followed by Mastectomy 3. Followed by Adjuvant XRT Patient wlll need Genetic counseling Ct scans and bone scans: do not show evidence of metastatic disease ------------------------------------------------------------------------------------------------------------------------------------------------------ Current treatment: completed 4 cycles of dose dense Adriamycin and Cytoxan today is cycle 4/12 Taxol and carboplatin  Chemo Toxicities: 1. Nausea 2-3 days after chemotherapy I encouraged her to take the prescription for nausea medication that she currently has been 2. Depression: With the loss of hair she has been very tearful. She is currently on Paxil and also take Xanax. 3. Alopecia 4. Bone pain related to Neulasta: Currently on Claritin  monitoring closely for toxicities Pain related to inflammatory breast cancer: Much improved with pain medication. Occasionally still has sharp pain  RTC in 2 weeks for Cycle 6/12 Taxol and carboplatin

## 2015-04-05 ENCOUNTER — Encounter: Payer: Self-pay | Admitting: Hematology and Oncology

## 2015-04-05 ENCOUNTER — Ambulatory Visit (HOSPITAL_BASED_OUTPATIENT_CLINIC_OR_DEPARTMENT_OTHER): Payer: Medicare Other | Admitting: Hematology and Oncology

## 2015-04-05 ENCOUNTER — Ambulatory Visit: Payer: Medicare Other | Admitting: Nutrition

## 2015-04-05 ENCOUNTER — Ambulatory Visit (HOSPITAL_BASED_OUTPATIENT_CLINIC_OR_DEPARTMENT_OTHER): Payer: Medicare Other

## 2015-04-05 ENCOUNTER — Telehealth: Payer: Self-pay | Admitting: Hematology and Oncology

## 2015-04-05 ENCOUNTER — Other Ambulatory Visit (HOSPITAL_BASED_OUTPATIENT_CLINIC_OR_DEPARTMENT_OTHER): Payer: Medicare Other

## 2015-04-05 VITALS — BP 133/79 | HR 87 | Temp 98.0°F | Resp 18 | Wt 199.1 lb

## 2015-04-05 DIAGNOSIS — C773 Secondary and unspecified malignant neoplasm of axilla and upper limb lymph nodes: Secondary | ICD-10-CM

## 2015-04-05 DIAGNOSIS — C50412 Malignant neoplasm of upper-outer quadrant of left female breast: Secondary | ICD-10-CM

## 2015-04-05 DIAGNOSIS — F329 Major depressive disorder, single episode, unspecified: Secondary | ICD-10-CM | POA: Diagnosis not present

## 2015-04-05 DIAGNOSIS — Z5111 Encounter for antineoplastic chemotherapy: Secondary | ICD-10-CM | POA: Diagnosis not present

## 2015-04-05 DIAGNOSIS — D6481 Anemia due to antineoplastic chemotherapy: Secondary | ICD-10-CM | POA: Insufficient documentation

## 2015-04-05 DIAGNOSIS — G893 Neoplasm related pain (acute) (chronic): Secondary | ICD-10-CM | POA: Diagnosis not present

## 2015-04-05 DIAGNOSIS — T451X5A Adverse effect of antineoplastic and immunosuppressive drugs, initial encounter: Secondary | ICD-10-CM

## 2015-04-05 DIAGNOSIS — R110 Nausea: Secondary | ICD-10-CM | POA: Diagnosis not present

## 2015-04-05 DIAGNOSIS — R530 Neoplastic (malignant) related fatigue: Secondary | ICD-10-CM

## 2015-04-05 LAB — COMPREHENSIVE METABOLIC PANEL
ALBUMIN: 3.6 g/dL (ref 3.5–5.0)
ALK PHOS: 63 U/L (ref 40–150)
ALT: 9 U/L (ref 0–55)
ANION GAP: 6 meq/L (ref 3–11)
AST: 15 U/L (ref 5–34)
BUN: 8.4 mg/dL (ref 7.0–26.0)
CALCIUM: 8.7 mg/dL (ref 8.4–10.4)
CO2: 26 mEq/L (ref 22–29)
Chloride: 109 mEq/L (ref 98–109)
Creatinine: 0.7 mg/dL (ref 0.6–1.1)
Glucose: 96 mg/dl (ref 70–140)
POTASSIUM: 3.8 meq/L (ref 3.5–5.1)
Sodium: 141 mEq/L (ref 136–145)
Total Bilirubin: <0.3 mg/dL (ref 0.20–1.20)
Total Protein: 6.3 g/dL — ABNORMAL LOW (ref 6.4–8.3)

## 2015-04-05 LAB — CBC WITH DIFFERENTIAL/PLATELET
BASO%: 1 % (ref 0.0–2.0)
BASOS ABS: 0 10*3/uL (ref 0.0–0.1)
EOS ABS: 0.1 10*3/uL (ref 0.0–0.5)
EOS%: 2.6 % (ref 0.0–7.0)
HEMATOCRIT: 26.1 % — AB (ref 34.8–46.6)
HEMOGLOBIN: 8.4 g/dL — AB (ref 11.6–15.9)
LYMPH#: 0.9 10*3/uL (ref 0.9–3.3)
LYMPH%: 28.9 % (ref 14.0–49.7)
MCH: 32.3 pg (ref 25.1–34.0)
MCHC: 32.2 g/dL (ref 31.5–36.0)
MCV: 100.4 fL (ref 79.5–101.0)
MONO#: 0.2 10*3/uL (ref 0.1–0.9)
MONO%: 6.8 % (ref 0.0–14.0)
NEUT#: 1.9 10*3/uL (ref 1.5–6.5)
NEUT%: 60.7 % (ref 38.4–76.8)
Platelets: 151 10*3/uL (ref 145–400)
RBC: 2.6 10*6/uL — ABNORMAL LOW (ref 3.70–5.45)
RDW: 17.1 % — AB (ref 11.2–14.5)
WBC: 3.1 10*3/uL — ABNORMAL LOW (ref 3.9–10.3)

## 2015-04-05 MED ORDER — HEPARIN SOD (PORK) LOCK FLUSH 100 UNIT/ML IV SOLN
500.0000 [IU] | Freq: Once | INTRAVENOUS | Status: AC | PRN
  Administered 2015-04-05: 500 [IU]
  Filled 2015-04-05: qty 5

## 2015-04-05 MED ORDER — PACLITAXEL CHEMO INJECTION 300 MG/50ML
80.0000 mg/m2 | Freq: Once | INTRAVENOUS | Status: AC
  Administered 2015-04-05: 168 mg via INTRAVENOUS
  Filled 2015-04-05: qty 28

## 2015-04-05 MED ORDER — PALONOSETRON HCL INJECTION 0.25 MG/5ML
0.2500 mg | Freq: Once | INTRAVENOUS | Status: AC
  Administered 2015-04-05: 0.25 mg via INTRAVENOUS

## 2015-04-05 MED ORDER — SODIUM CHLORIDE 0.9 % IV SOLN
Freq: Once | INTRAVENOUS | Status: AC
  Administered 2015-04-05: 13:00:00 via INTRAVENOUS

## 2015-04-05 MED ORDER — SODIUM CHLORIDE 0.9 % IV SOLN
279.2000 mg | Freq: Once | INTRAVENOUS | Status: AC
  Administered 2015-04-05: 280 mg via INTRAVENOUS
  Filled 2015-04-05: qty 28

## 2015-04-05 MED ORDER — SODIUM CHLORIDE 0.9 % IJ SOLN
10.0000 mL | INTRAMUSCULAR | Status: DC | PRN
  Administered 2015-04-05: 10 mL
  Filled 2015-04-05: qty 10

## 2015-04-05 MED ORDER — DIPHENHYDRAMINE HCL 50 MG/ML IJ SOLN
25.0000 mg | Freq: Once | INTRAMUSCULAR | Status: AC
  Administered 2015-04-05: 25 mg via INTRAVENOUS

## 2015-04-05 MED ORDER — DIPHENHYDRAMINE HCL 50 MG/ML IJ SOLN
INTRAMUSCULAR | Status: AC
  Filled 2015-04-05: qty 1

## 2015-04-05 MED ORDER — SODIUM CHLORIDE 0.9 % IV SOLN
Freq: Once | INTRAVENOUS | Status: AC
  Administered 2015-04-05: 13:00:00 via INTRAVENOUS
  Filled 2015-04-05: qty 1

## 2015-04-05 MED ORDER — FAMOTIDINE IN NACL 20-0.9 MG/50ML-% IV SOLN
INTRAVENOUS | Status: AC
  Filled 2015-04-05: qty 50

## 2015-04-05 MED ORDER — FAMOTIDINE IN NACL 20-0.9 MG/50ML-% IV SOLN
20.0000 mg | Freq: Once | INTRAVENOUS | Status: AC
  Administered 2015-04-05: 20 mg via INTRAVENOUS

## 2015-04-05 MED ORDER — PALONOSETRON HCL INJECTION 0.25 MG/5ML
INTRAVENOUS | Status: AC
  Filled 2015-04-05: qty 5

## 2015-04-05 NOTE — Progress Notes (Signed)
Patient was identified to be at risk for malnutrition on the MST secondary to poor appetite and weight loss. Patient is a 60 year old female diagnosed with breast cancer. Current weight has improved and was documented as 199.1 pounds increased from 196.5 pounds. Usual body weight approximately 211 pounds. Patient reports she is having a lot of nausea and vomiting.  She would like information about how to eat with nausea, vomiting. Provided brief education to patient and provided a fact sheet on nausea and vomiting for patient to take home. Encouraged, high-protein diet and strive for weight maintenance. Contact information was provided.  Questions were answered.

## 2015-04-05 NOTE — Progress Notes (Signed)
Unable to get in to exam room prior to MD.  No assessment performed.  

## 2015-04-05 NOTE — Patient Instructions (Signed)
[facility] Discharge Instructions for Patients Receiving Chemotherapy  Today you received the following chemotherapy agents:Adriamycin and Cytoxan   To help prevent nausea and vomiting after your treatment, we encourage you to take your nausea medication as directed.    If you develop nausea and vomiting that is not controlled by your nausea medication, call the clinic.   BELOW ARE SYMPTOMS THAT SHOULD BE REPORTED IMMEDIATELY:  *FEVER GREATER THAN 100.5 F  *CHILLS WITH OR WITHOUT FEVER  NAUSEA AND VOMITING THAT IS NOT CONTROLLED WITH YOUR NAUSEA MEDICATION  *UNUSUAL SHORTNESS OF BREATH  *UNUSUAL BRUISING OR BLEEDING  TENDERNESS IN MOUTH AND THROAT WITH OR WITHOUT PRESENCE OF ULCERS  *URINARY PROBLEMS  *BOWEL PROBLEMS  UNUSUAL RASH Items with * indicate a potential emergency and should be followed up as soon as possible.  Feel free to call the clinic you have any questions or concerns. The clinic phone number is [phone].  Please show the CHEMO ALERT CARD at check-in to the Emergency Department and triage nurse.   

## 2015-04-05 NOTE — Telephone Encounter (Signed)
appt made and avs printed °

## 2015-04-05 NOTE — Progress Notes (Signed)
Left msg for pt to return my call to discuss financial concerns she may have.

## 2015-04-05 NOTE — Progress Notes (Signed)
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 Today is cycle 4 of Taxol and carboplatin. She is experiencing more fatigue. She is also experiencing more nausea than before.she feels that she has to gag for a day or 2 and Zofran has not been helping.she is also extremely fatigued.  REVIEW OF SYSTEMS:   Constitutional: Denies fevers, chills or abnormal weight loss Eyes: Denies blurriness of vision Ears, nose, mouth, throat, and face: Denies mucositis or sore throat Respiratory: Denies cough, dyspnea or wheezes Cardiovascular: Denies palpitation, chest discomfort Gastrointestinal:  Severe nausea Skin: Denies abnormal skin rashes Lymphatics: Denies new lymphadenopathy or easy bruising Neurological:Denies numbness, tingling or new weaknesses Behavioral/Psych: Mood is stable, no new changes  Extremities: No lower extremity edema Breast: the breast mass is markedly smaller All other systems were reviewed with the patient and are negative.  I have reviewed the past medical history, past surgical history, social history and family history with  the patient and they are unchanged from previous note.  ALLERGIES:  has No Known Allergies.  MEDICATIONS:  Current Outpatient Prescriptions  Medication Sig Dispense Refill  . albuterol (PROVENTIL) (2.5 MG/3ML) 0.083% nebulizer solution Take 3 mLs (2.5 mg total) by nebulization every 4 (four) hours as needed for wheezing or shortness of breath. (Patient not taking: Reported on 03/22/2015) 75 mL 12  . ALPRAZolam (XANAX) 0.5 MG tablet Take 0.5 mg by mouth 3 (three) times daily as needed for anxiety or sleep.    [name] amLODipine (NORVASC) 5 MG tablet Take 5 mg by mouth 2 (two) times daily.    [name] ibuprofen (ADVIL,MOTRIN) 200 MG tablet Take 200 mg by mouth every 6 (six) hours as needed. Reported on 03/22/2015    . LORazepam (ATIVAN) 0.5 MG tablet Take 1 tablet (0.5 mg total) by mouth at bedtime. 30 tablet 0  . metoprolol (LOPRESSOR) 50 MG tablet Take 0.5 tablets (25 mg total) by mouth 2 (two) times daily. (Patient taking differently: Take 50 mg by mouth 2 (two) times daily. )    . oxyCODONE-acetaminophen (PERCOCET/ROXICET) 5-325 MG tablet Take 1 tablet by mouth every 8 (eight) hours as needed for severe pain. 60 tablet 0  . PARoxetine (PAXIL) 20 MG tablet Take 20 mg by mouth 2 (two) times daily.      No current facility-administered medications for this visit.    PHYSICAL EXAMINATION: ECOG PERFORMANCE STATUS: 1 - Symptomatic but completely ambulatory  Filed Vitals:   04/05/15 1121  BP: 133/79  Pulse: 87  Temp: 98 F (36.7 C)  Resp: 18   Filed Weights   04/05/15 1121  Weight: 199 lb 1.6 oz (90.311 kg)    GENERAL:alert, no distress and comfortable SKIN: skin  color, texture, turgor are normal, no rashes or significant lesions EYES: normal, Conjunctiva are pink and non-injected, sclera clear OROPHARYNX:no exudate, no erythema and lips, buccal mucosa, and tongue normal  NECK: supple, thyroid normal size, non-tender, without nodularity LYMPH:  no palpable lymphadenopathy in the cervical,  axillary or inguinal LUNGS: clear to auscultation and percussion with normal breathing effort HEART: regular rate & rhythm and no murmurs and no lower extremity edema ABDOMEN:abdomen soft, non-tender and normal bowel sounds MUSCULOSKELETAL:no cyanosis of digits and no clubbing  NEURO: alert & oriented x 3 with fluent speech, no focal motor/sensory deficits EXTREMITIES: No lower extremity edema   LABORATORY DATA:  I have reviewed the data as listed   Chemistry      Component Value Date/Time   NA 141 04/05/2015 1108   NA 143 01/17/2015 1228   K 3.8 04/05/2015 1108   K 3.9 01/17/2015 1228   CL 106 01/17/2015 1228   CO2 26 04/05/2015 1108   CO2 26 01/17/2015 1228   BUN 8.4 04/05/2015 1108   BUN 15 01/17/2015 1228   CREATININE 0.7 04/05/2015 1108   CREATININE 0.78 01/17/2015 1228      Component Value Date/Time   CALCIUM 8.7 04/05/2015 1108   CALCIUM 9.5 01/17/2015 1228   ALKPHOS 63 04/05/2015 1108   ALKPHOS 55 10/29/2007 2156   AST 15 04/05/2015 1108   AST 18 10/29/2007 2156   ALT 9 04/05/2015 1108   ALT 12 10/29/2007 2156   BILITOT <0.30 04/05/2015 1108   BILITOT 0.5 10/29/2007 2156      Lab Results  Component Value Date   WBC 3.1* 04/05/2015   HGB 8.4* 04/05/2015   HCT 26.1* 04/05/2015   MCV 100.4 04/05/2015   PLT 151 04/05/2015   NEUTROABS 1.9 04/05/2015   ASSESSMENT & PLAN:  Breast cancer of upper-outer quadrant of left female breast ([place]) Left breast biopsy 12/26/14: inflammatory breast cancer Invasive ductal carcinoma with lymphovascular invasion, 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  Treatment Plan: 1. Neoadjuvant chemotherapy with dose dense Adriamycin and Cytoxan 4 followed by Taxol and carboplatin 12 weekly 2. Followed by Mastectomy 3.  Followed by Adjuvant XRT Patient wlll need Genetic counseling Ct scans and bone scans: do not show evidence of metastatic disease ------------------------------------------------------------------------------------------------------------------------------------------------------ Current treatment: completed 4 cycles of dose dense Adriamycin and Cytoxan today is cycle 4/12 Taxol and carboplatin  Chemo Toxicities: 1. Nausea 2-3 days after chemotherapy I encouraged her to take the prescription for nausea medication that she currently has been. I will replace IV Zofran with IV Aloxi 2. Depression: With the loss of hair she has been very tearful. She is currently on Paxil and also take Xanax. 3. Alopecia 4. Bone pain related to Neulasta: Currently on Claritin 5. Severe fatigue due to chemotherapy 6. Chemotherapy-induced anemia monitoring very closely today's hemoglobin is 8.4.  monitoring closely for toxicities Pain related to inflammatory breast cancer: Much improved with pain medication. Occasionally still has sharp pain  RTC in 3 weeks for Cycle 7/12 Taxol and carboplatin   No orders of the defined types were placed in this encounter.   The patient has a good understanding of the overall plan. she agrees with it. she will call with any problems that may develop before the next visit here.  Rulon Eisenmenger, MD 04/05/2015

## 2015-04-08 ENCOUNTER — Encounter: Payer: Self-pay | Admitting: *Deleted

## 2015-04-12 ENCOUNTER — Other Ambulatory Visit (HOSPITAL_BASED_OUTPATIENT_CLINIC_OR_DEPARTMENT_OTHER): Payer: Medicare Other

## 2015-04-12 ENCOUNTER — Ambulatory Visit (HOSPITAL_BASED_OUTPATIENT_CLINIC_OR_DEPARTMENT_OTHER): Payer: Medicare Other

## 2015-04-12 ENCOUNTER — Other Ambulatory Visit: Payer: Self-pay

## 2015-04-12 ENCOUNTER — Other Ambulatory Visit: Payer: Self-pay | Admitting: Hematology and Oncology

## 2015-04-12 ENCOUNTER — Encounter: Payer: Self-pay | Admitting: *Deleted

## 2015-04-12 ENCOUNTER — Ambulatory Visit: Payer: Medicare Other

## 2015-04-12 VITALS — BP 131/76 | HR 92 | Temp 98.1°F | Resp 18

## 2015-04-12 DIAGNOSIS — C50412 Malignant neoplasm of upper-outer quadrant of left female breast: Secondary | ICD-10-CM

## 2015-04-12 DIAGNOSIS — R112 Nausea with vomiting, unspecified: Secondary | ICD-10-CM

## 2015-04-12 DIAGNOSIS — J209 Acute bronchitis, unspecified: Secondary | ICD-10-CM

## 2015-04-12 DIAGNOSIS — J42 Unspecified chronic bronchitis: Secondary | ICD-10-CM

## 2015-04-12 LAB — COMPREHENSIVE METABOLIC PANEL
ALT: 10 U/L (ref 0–55)
ANION GAP: 8 meq/L (ref 3–11)
AST: 17 U/L (ref 5–34)
Albumin: 3.9 g/dL (ref 3.5–5.0)
Alkaline Phosphatase: 68 U/L (ref 40–150)
BILIRUBIN TOTAL: 0.42 mg/dL (ref 0.20–1.20)
BUN: 10.5 mg/dL (ref 7.0–26.0)
CO2: 25 meq/L (ref 22–29)
Calcium: 9.4 mg/dL (ref 8.4–10.4)
Chloride: 110 mEq/L — ABNORMAL HIGH (ref 98–109)
Creatinine: 0.7 mg/dL (ref 0.6–1.1)
GLUCOSE: 100 mg/dL (ref 70–140)
POTASSIUM: 4.3 meq/L (ref 3.5–5.1)
SODIUM: 143 meq/L (ref 136–145)
TOTAL PROTEIN: 6.8 g/dL (ref 6.4–8.3)

## 2015-04-12 LAB — CBC WITH DIFFERENTIAL/PLATELET
BASO%: 0.4 % (ref 0.0–2.0)
Basophils Absolute: 0 10*3/uL (ref 0.0–0.1)
EOS%: 2.6 % (ref 0.0–7.0)
Eosinophils Absolute: 0.1 10*3/uL (ref 0.0–0.5)
HEMATOCRIT: 27.1 % — AB (ref 34.8–46.6)
HGB: 8.9 g/dL — ABNORMAL LOW (ref 11.6–15.9)
LYMPH#: 0.7 10*3/uL — AB (ref 0.9–3.3)
LYMPH%: 27.8 % (ref 14.0–49.7)
MCH: 33 pg (ref 25.1–34.0)
MCHC: 32.8 g/dL (ref 31.5–36.0)
MCV: 100.4 fL (ref 79.5–101.0)
MONO#: 0.2 10*3/uL (ref 0.1–0.9)
MONO%: 7.3 % (ref 0.0–14.0)
NEUT%: 61.9 % (ref 38.4–76.8)
NEUTROS ABS: 1.5 10*3/uL (ref 1.5–6.5)
PLATELETS: 85 10*3/uL — AB (ref 145–400)
RBC: 2.7 10*6/uL — ABNORMAL LOW (ref 3.70–5.45)
RDW: 17.4 % — AB (ref 11.2–14.5)
WBC: 2.3 10*3/uL — AB (ref 3.9–10.3)

## 2015-04-12 MED ORDER — SODIUM CHLORIDE 0.9% FLUSH
10.0000 mL | INTRAVENOUS | Status: AC | PRN
  Administered 2015-04-12: 10 mL
  Filled 2015-04-12: qty 10

## 2015-04-12 MED ORDER — LIDOCAINE-PRILOCAINE 2.5-2.5 % EX CREA: 1.0000 "application " | TOPICAL_CREAM | CUTANEOUS | Status: DC | PRN

## 2015-04-12 MED ORDER — SODIUM CHLORIDE 0.9 % IV SOLN
Freq: Once | INTRAVENOUS | Status: AC
  Administered 2015-04-12: 13:00:00 via INTRAVENOUS
  Filled 2015-04-12: qty 4

## 2015-04-12 MED ORDER — PROCHLORPERAZINE MALEATE 10 MG PO TABS: ORAL_TABLET | ORAL | Status: DC

## 2015-04-12 MED ORDER — HEPARIN SOD (PORK) LOCK FLUSH 100 UNIT/ML IV SOLN
500.0000 [IU] | INTRAVENOUS | Status: AC | PRN
  Administered 2015-04-12: 500 [IU]
  Filled 2015-04-12: qty 5

## 2015-04-12 MED ORDER — OXYCODONE-ACETAMINOPHEN 5-325 MG PO TABS: 1.0000 | ORAL_TABLET | Freq: Three times a day (TID) | ORAL | Status: DC | PRN

## 2015-04-12 MED ORDER — SODIUM CHLORIDE 0.9 % IV SOLN
Freq: Once | INTRAVENOUS | Status: AC
  Administered 2015-04-12: 13:00:00 via INTRAVENOUS

## 2015-04-12 MED ORDER — ONDANSETRON HCL 8 MG PO TABS: ORAL_TABLET | ORAL | Status: DC

## 2015-04-12 NOTE — Progress Notes (Signed)
Plt 85 - hold chemo today.  Dr. Lindi Adie will dose reduce for next week.   Pt reports nausea and vomiting today.    Per Dr. Lindi Adie, IVF and IV zofran today.   Infusion and pharmacy notified.

## 2015-04-12 NOTE — Progress Notes (Unsigned)
No treatment today with platelet count of 85 per Dr. Lindi Adie.  Pt to receive 1 liter of NS and Zofran IV per MD order d/t increased nausea and vomiting per pt.

## 2015-04-12 NOTE — Patient Instructions (Addendum)
Dehydration, Adult Dehydration is a condition in which you do not have enough fluid or water in your body. It happens when you take in less fluid than you lose. Vital organs such as the kidneys, brain, and heart cannot function without a proper amount of fluids. Any loss of fluids from the body can cause dehydration.  Dehydration can range from mild to severe. This condition should be treated right away to help prevent it from becoming severe. CAUSES  This condition may be caused by:  Vomiting.  Diarrhea.  Excessive sweating, such as when exercising in hot or humid weather.  Not drinking enough fluid during strenuous exercise or during an illness.  Excessive urine output.  Fever.  Certain medicines. RISK FACTORS This condition is more likely to develop in:  People who are taking certain medicines that cause the body to lose excess fluid (diuretics).   People who have a chronic illness, such as diabetes, that may increase urination.  Older adults.   People who live at high altitudes.   People who participate in endurance sports.  SYMPTOMS  Mild Dehydration  Thirst.  Dry lips.  Slightly dry mouth.  Dry, warm skin. Moderate Dehydration  Very dry mouth.   Muscle cramps.   Dark urine and decreased urine production.   Decreased tear production.   Headache.   Light-headedness, especially when you stand up from a sitting position.  Severe Dehydration  Changes in skin.   Cold and clammy skin.   Skin does not spring back quickly when lightly pinched and released.   Changes in body fluids.   Extreme thirst.   No tears.   Not able to sweat when body temperature is high, such as in hot weather.   Minimal urine production.   Changes in vital signs.   Rapid, weak pulse (more than 100 beats per minute when you are sitting still).   Rapid breathing.   Low blood pressure.   Other changes.   Sunken eyes.   Cold hands and feet.    Confusion.  Lethargy and difficulty being awakened.  Fainting (syncope).   Short-term weight loss.   Unconsciousness. DIAGNOSIS  This condition may be diagnosed based on your symptoms. You may also have tests to determine how severe your dehydration is. These tests may include:   Urine tests.   Blood tests.  TREATMENT  Treatment for this condition depends on the severity. Mild or moderate dehydration can often be treated at home. Treatment should be started right away. Do not wait until dehydration becomes severe. Severe dehydration needs to be treated at the hospital. Treatment for Mild Dehydration  Drinking plenty of water to replace the fluid you have lost.   Replacing minerals in your blood (electrolytes) that you may have lost.  Treatment for Moderate Dehydration  Consuming oral rehydration solution (ORS). Treatment for Severe Dehydration  Receiving fluid through an IV tube.   Receiving electrolyte solution through a feeding tube that is passed through your nose and into your stomach (nasogastric tube or NG tube).  Correcting any abnormalities in electrolytes. HOME CARE INSTRUCTIONS   Drink enough fluid to keep your urine clear or pale yellow.   Drink water or fluid slowly by taking small sips. You can also try sucking on ice cubes.  Have food or beverages that contain electrolytes. Examples include bananas and sports drinks.  Take over-the-counter and prescription medicines only as told by your health care provider.   Prepare ORS according to the manufacturer's instructions. Take sips   of ORS every 5 minutes until your urine returns to normal.  If you have vomiting or diarrhea, continue to try to drink water, ORS, or both.   If you have diarrhea, avoid:   Beverages that contain caffeine.   Fruit juice.   Milk.   Carbonated soft drinks.  Do not take salt tablets. This can lead to the condition of having too much sodium in your body  (hypernatremia).  SEEK MEDICAL CARE IF:  You cannot eat or drink without vomiting.  You have had moderate diarrhea during a period of more than 24 hours.  You have a fever. SEEK IMMEDIATE MEDICAL CARE IF:   You have extreme thirst.  You have severe diarrhea.  You have not urinated in 6-8 hours, or you have urinated only a small amount of very dark urine.  You have shriveled skin.  You are dizzy, confused, or both.   This information is not intended to replace advice given to you by your health care provider. Make sure you discuss any questions you have with your health care provider.   Document Released: 12/22/2004 Document Revised: 09/12/2014 Document Reviewed: 05/09/2014 Elsevier Interactive Patient Education 2016 [employer]  Nausea and Vomiting Nausea is a sick feeling that often comes before throwing up (vomiting). Vomiting is a reflex where stomach contents come out of your mouth. Vomiting can cause severe loss of body fluids (dehydration). Children and elderly adults can become dehydrated quickly, especially if they also have diarrhea. Nausea and vomiting are symptoms of a condition or disease. It is important to find the cause of your symptoms. CAUSES   Direct irritation of the stomach lining. This irritation can result from increased acid production (gastroesophageal reflux disease), infection, food poisoning, taking certain medicines (such as nonsteroidal anti-inflammatory drugs), alcohol use, or tobacco use.  Signals from the brain.These signals could be caused by a headache, heat exposure, an inner ear disturbance, increased pressure in the brain from injury, infection, a tumor, or a concussion, pain, emotional stimulus, or metabolic problems.  An obstruction in the gastrointestinal tract (bowel obstruction).  Illnesses such as diabetes, hepatitis, gallbladder problems, appendicitis, kidney problems, cancer, sepsis, atypical symptoms of a heart attack, or eating  disorders.  Medical treatments such as chemotherapy and radiation.  Receiving medicine that makes you sleep (general anesthetic) during surgery. DIAGNOSIS Your caregiver may ask for tests to be done if the problems do not improve after a few days. Tests may also be done if symptoms are severe or if the reason for the nausea and vomiting is not clear. Tests may include:  Urine tests.  Blood tests.  Stool tests.  Cultures (to look for evidence of infection).  X-rays or other imaging studies. Test results can help your caregiver make decisions about treatment or the need for additional tests. TREATMENT You need to stay well hydrated. Drink frequently but in small amounts.You may wish to drink water, sports drinks, clear broth, or eat frozen ice pops or gelatin dessert to help stay hydrated.When you eat, eating slowly may help prevent nausea.There are also some antinausea medicines that may help prevent nausea. HOME CARE INSTRUCTIONS   Take all medicine as directed by your caregiver.  If you do not have an appetite, do not force yourself to eat. However, you must continue to drink fluids.  If you have an appetite, eat a normal diet unless your caregiver tells you differently.  Eat a variety of complex carbohydrates (rice, wheat, potatoes, bread), lean meats, yogurt, fruits, and vegetables.  Avoid high-fat foods because they are more difficult to digest.  Drink enough water and fluids to keep your urine clear or pale yellow.  If you are dehydrated, ask your caregiver for specific rehydration instructions. Signs of dehydration may include:  Severe thirst.  Dry lips and mouth.  Dizziness.  Dark urine.  Decreasing urine frequency and amount.  Confusion.  Rapid breathing or pulse. SEEK IMMEDIATE MEDICAL CARE IF:   You have blood or brown flecks (like coffee grounds) in your vomit.  You have black or bloody stools.  You have a severe headache or stiff neck.  You are  confused.  You have severe abdominal pain.  You have chest pain or trouble breathing.  You do not urinate at least once every 8 hours.  You develop cold or clammy skin.  You continue to vomit for longer than 24 to 48 hours.  You have a fever. MAKE SURE YOU:   Understand these instructions.  Will watch your condition.  Will get help right away if you are not doing well or get worse.   This information is not intended to replace advice given to you by your health care provider. Make sure you discuss any questions you have with your health care provider.   Document Released: 12/22/2004 Document Revised: 03/16/2011 Document Reviewed: 05/21/2010 Elsevier Interactive Patient Education 2016 [place].  Diarrhea Diarrhea is watery poop (stool). It can make you feel weak, tired, thirsty, or give you a dry mouth (signs of dehydration). Watery poop is a sign of another problem, most often an infection. It often lasts 2-3 days. It can last longer if it is a sign of something serious. Take care of yourself as told by your doctor. HOME CARE   Drink 1 cup (8 ounces) of fluid each time you have watery poop.  Do not drink the following fluids:  Those that contain simple sugars (fructose, glucose, galactose, lactose, sucrose, maltose).  Sports drinks.  Fruit juices.  Whole milk products.  Sodas.  Drinks with caffeine (coffee, tea, soda) or alcohol.  Oral rehydration solution may be used if the doctor says it is okay. You may make your own solution. Follow this recipe:   - teaspoon table salt.   teaspoon baking soda.   teaspoon salt substitute containing potassium chloride.  1 tablespoons sugar.  1 liter (34 ounces) of water.  Avoid the following foods:  High fiber foods, such as raw fruits and vegetables.  Nuts, seeds, and whole grain breads and cereals.   Those that are sweetened with sugar alcohols (xylitol, sorbitol, mannitol).  Try eating the following  foods:  Starchy foods, such as rice, toast, pasta, low-sugar cereal, oatmeal, baked potatoes, crackers, and bagels.  Bananas.  Applesauce.  Eat probiotic-rich foods, such as yogurt and milk products that are fermented.  Wash your hands well after each time you have watery poop.  Only take medicine as told by your doctor.  Take a warm bath to help lessen burning or pain from having watery poop. GET HELP RIGHT AWAY IF:   You cannot drink fluids without throwing up (vomiting).  You keep throwing up.  You have blood in your poop, or your poop looks black and tarry.  You do not pee (urinate) in 6-8 hours, or there is only a small amount of very dark pee.  You have belly (abdominal) pain that gets worse or stays in the same spot (localizes).  You are weak, dizzy, confused, or light-headed.  You have a very  bad headache.  Your watery poop gets worse or does not get better.  You have a fever or lasting symptoms for more than 2-3 days.  You have a fever and your symptoms suddenly get worse. MAKE SURE YOU:   Understand these instructions.  Will watch your condition.  Will get help right away if you are not doing well or get worse.   This information is not intended to replace advice given to you by your health care provider. Make sure you discuss any questions you have with your health care provider.   Document Released: 06/10/2007 Document Revised: 01/12/2014 Document Reviewed: 08/30/2011 Elsevier Interactive Patient Education [employer].

## 2015-04-19 ENCOUNTER — Other Ambulatory Visit (HOSPITAL_BASED_OUTPATIENT_CLINIC_OR_DEPARTMENT_OTHER): Payer: Medicare Other

## 2015-04-19 ENCOUNTER — Ambulatory Visit: Payer: Medicare Other

## 2015-04-19 DIAGNOSIS — C50412 Malignant neoplasm of upper-outer quadrant of left female breast: Secondary | ICD-10-CM | POA: Diagnosis present

## 2015-04-19 LAB — CBC WITH DIFFERENTIAL/PLATELET
BASO%: 0.5 % (ref 0.0–2.0)
BASOS ABS: 0 10*3/uL (ref 0.0–0.1)
EOS ABS: 0.1 10*3/uL (ref 0.0–0.5)
EOS%: 3.2 % (ref 0.0–7.0)
HEMATOCRIT: 29.2 % — AB (ref 34.8–46.6)
HGB: 9.3 g/dL — ABNORMAL LOW (ref 11.6–15.9)
LYMPH#: 0.8 10*3/uL — AB (ref 0.9–3.3)
LYMPH%: 43.3 % (ref 14.0–49.7)
MCH: 32.6 pg (ref 25.1–34.0)
MCHC: 31.8 g/dL (ref 31.5–36.0)
MCV: 102.5 fL — AB (ref 79.5–101.0)
MONO#: 0.2 10*3/uL (ref 0.1–0.9)
MONO%: 11.8 % (ref 0.0–14.0)
NEUT#: 0.8 10*3/uL — ABNORMAL LOW (ref 1.5–6.5)
NEUT%: 41.2 % (ref 38.4–76.8)
PLATELETS: 100 10*3/uL — AB (ref 145–400)
RBC: 2.85 10*6/uL — ABNORMAL LOW (ref 3.70–5.45)
RDW: 17.7 % — ABNORMAL HIGH (ref 11.2–14.5)
WBC: 1.9 10*3/uL — ABNORMAL LOW (ref 3.9–10.3)

## 2015-04-19 LAB — COMPREHENSIVE METABOLIC PANEL
ALK PHOS: 70 U/L (ref 40–150)
ALT: 10 U/L (ref 0–55)
ANION GAP: 8 meq/L (ref 3–11)
AST: 18 U/L (ref 5–34)
Albumin: 3.8 g/dL (ref 3.5–5.0)
BUN: 10.3 mg/dL (ref 7.0–26.0)
CALCIUM: 9.2 mg/dL (ref 8.4–10.4)
CO2: 26 mEq/L (ref 22–29)
CREATININE: 0.8 mg/dL (ref 0.6–1.1)
Chloride: 107 mEq/L (ref 98–109)
GLUCOSE: 96 mg/dL (ref 70–140)
POTASSIUM: 4.1 meq/L (ref 3.5–5.1)
Sodium: 142 mEq/L (ref 136–145)
Total Bilirubin: 0.66 mg/dL (ref 0.20–1.20)
Total Protein: 6.5 g/dL (ref 6.4–8.3)

## 2015-04-19 NOTE — Progress Notes (Signed)
ANC 0.8; per Nira Conn B. NP, hold today's tx, neupogen to be given 4/18 on Tuesday before next cycle. POF sent to scheduler to add to injection schedule; neupogen placed under signed and held. Neutropenic precautions reviewed with patient.

## 2015-04-23 ENCOUNTER — Ambulatory Visit (HOSPITAL_BASED_OUTPATIENT_CLINIC_OR_DEPARTMENT_OTHER): Payer: Medicare Other

## 2015-04-23 VITALS — BP 133/86 | HR 88 | Temp 98.6°F

## 2015-04-23 DIAGNOSIS — Z5189 Encounter for other specified aftercare: Secondary | ICD-10-CM | POA: Diagnosis not present

## 2015-04-23 DIAGNOSIS — C773 Secondary and unspecified malignant neoplasm of axilla and upper limb lymph nodes: Secondary | ICD-10-CM

## 2015-04-23 DIAGNOSIS — C50412 Malignant neoplasm of upper-outer quadrant of left female breast: Secondary | ICD-10-CM

## 2015-04-23 MED ORDER — FILGRASTIM 300 MCG/0.5ML IJ SOSY
300.0000 ug | PREFILLED_SYRINGE | Freq: Once | INTRAMUSCULAR | Status: DC
Start: 2015-04-23 — End: 2015-04-23

## 2015-04-23 MED ORDER — TBO-FILGRASTIM 300 MCG/0.5ML ~~LOC~~ SOSY
300.0000 ug | PREFILLED_SYRINGE | Freq: Once | SUBCUTANEOUS | Status: AC
  Administered 2015-04-23: 300 ug via SUBCUTANEOUS
  Filled 2015-04-23: qty 0.5

## 2015-04-23 NOTE — Patient Instructions (Signed)

## 2015-04-26 ENCOUNTER — Telehealth: Payer: Self-pay | Admitting: Hematology and Oncology

## 2015-04-26 ENCOUNTER — Encounter: Payer: Self-pay | Admitting: Hematology and Oncology

## 2015-04-26 ENCOUNTER — Other Ambulatory Visit (HOSPITAL_BASED_OUTPATIENT_CLINIC_OR_DEPARTMENT_OTHER): Payer: Medicare Other

## 2015-04-26 ENCOUNTER — Ambulatory Visit (HOSPITAL_BASED_OUTPATIENT_CLINIC_OR_DEPARTMENT_OTHER): Payer: Medicare Other

## 2015-04-26 ENCOUNTER — Ambulatory Visit (HOSPITAL_BASED_OUTPATIENT_CLINIC_OR_DEPARTMENT_OTHER): Payer: Medicare Other | Admitting: Hematology and Oncology

## 2015-04-26 VITALS — BP 145/94 | HR 99 | Temp 98.0°F | Resp 99 | Wt 193.2 lb

## 2015-04-26 DIAGNOSIS — C50412 Malignant neoplasm of upper-outer quadrant of left female breast: Secondary | ICD-10-CM

## 2015-04-26 DIAGNOSIS — G629 Polyneuropathy, unspecified: Secondary | ICD-10-CM | POA: Diagnosis not present

## 2015-04-26 DIAGNOSIS — D701 Agranulocytosis secondary to cancer chemotherapy: Secondary | ICD-10-CM | POA: Diagnosis not present

## 2015-04-26 DIAGNOSIS — D61810 Antineoplastic chemotherapy induced pancytopenia: Secondary | ICD-10-CM

## 2015-04-26 DIAGNOSIS — C773 Secondary and unspecified malignant neoplasm of axilla and upper limb lymph nodes: Secondary | ICD-10-CM

## 2015-04-26 DIAGNOSIS — Z5111 Encounter for antineoplastic chemotherapy: Secondary | ICD-10-CM

## 2015-04-26 DIAGNOSIS — F329 Major depressive disorder, single episode, unspecified: Secondary | ICD-10-CM

## 2015-04-26 DIAGNOSIS — M898X9 Other specified disorders of bone, unspecified site: Secondary | ICD-10-CM

## 2015-04-26 DIAGNOSIS — D6481 Anemia due to antineoplastic chemotherapy: Secondary | ICD-10-CM | POA: Diagnosis not present

## 2015-04-26 DIAGNOSIS — T451X5A Adverse effect of antineoplastic and immunosuppressive drugs, initial encounter: Secondary | ICD-10-CM

## 2015-04-26 LAB — COMPREHENSIVE METABOLIC PANEL
ALBUMIN: 3.7 g/dL (ref 3.5–5.0)
ALK PHOS: 79 U/L (ref 40–150)
ALT: <9 U/L (ref 0–55)
ANION GAP: 9 meq/L (ref 3–11)
AST: 15 U/L (ref 5–34)
BUN: 11.2 mg/dL (ref 7.0–26.0)
CALCIUM: 9.2 mg/dL (ref 8.4–10.4)
CO2: 24 mEq/L (ref 22–29)
Chloride: 112 mEq/L — ABNORMAL HIGH (ref 98–109)
Creatinine: 0.8 mg/dL (ref 0.6–1.1)
Glucose: 98 mg/dl (ref 70–140)
Potassium: 3.7 mEq/L (ref 3.5–5.1)
Sodium: 145 mEq/L (ref 136–145)
TOTAL PROTEIN: 6.6 g/dL (ref 6.4–8.3)

## 2015-04-26 LAB — CBC WITH DIFFERENTIAL/PLATELET
BASO%: 0.2 % (ref 0.0–2.0)
BASOS ABS: 0 10*3/uL (ref 0.0–0.1)
EOS ABS: 0.1 10*3/uL (ref 0.0–0.5)
EOS%: 1.7 % (ref 0.0–7.0)
HCT: 29.9 % — ABNORMAL LOW (ref 34.8–46.6)
HEMOGLOBIN: 9.8 g/dL — AB (ref 11.6–15.9)
LYMPH%: 21.8 % (ref 14.0–49.7)
MCH: 34 pg (ref 25.1–34.0)
MCHC: 32.9 g/dL (ref 31.5–36.0)
MCV: 103.4 fL — AB (ref 79.5–101.0)
MONO#: 0.3 10*3/uL (ref 0.1–0.9)
MONO%: 7.6 % (ref 0.0–14.0)
NEUT%: 68.7 % (ref 38.4–76.8)
NEUTROS ABS: 2.9 10*3/uL (ref 1.5–6.5)
PLATELETS: 144 10*3/uL — AB (ref 145–400)
RBC: 2.9 10*6/uL — ABNORMAL LOW (ref 3.70–5.45)
RDW: 19.1 % — AB (ref 11.2–14.5)
WBC: 4.2 10*3/uL (ref 3.9–10.3)
lymph#: 0.9 10*3/uL (ref 0.9–3.3)

## 2015-04-26 MED ORDER — HEPARIN SOD (PORK) LOCK FLUSH 100 UNIT/ML IV SOLN
500.0000 [IU] | Freq: Once | INTRAVENOUS | Status: AC | PRN
  Administered 2015-04-26: 500 [IU]
  Filled 2015-04-26: qty 5

## 2015-04-26 MED ORDER — PALONOSETRON HCL INJECTION 0.25 MG/5ML
0.2500 mg | Freq: Once | INTRAVENOUS | Status: AC
  Administered 2015-04-26: 0.25 mg via INTRAVENOUS

## 2015-04-26 MED ORDER — SODIUM CHLORIDE 0.9 % IV SOLN
Freq: Once | INTRAVENOUS | Status: AC
  Administered 2015-04-26: 10:00:00 via INTRAVENOUS
  Filled 2015-04-26: qty 1

## 2015-04-26 MED ORDER — SODIUM CHLORIDE 0.9 % IV SOLN
Freq: Once | INTRAVENOUS | Status: AC
  Administered 2015-04-26: 10:00:00 via INTRAVENOUS

## 2015-04-26 MED ORDER — SODIUM CHLORIDE 0.9 % IV SOLN
60.0000 mg/m2 | Freq: Once | INTRAVENOUS | Status: AC
  Administered 2015-04-26: 126 mg via INTRAVENOUS
  Filled 2015-04-26: qty 21

## 2015-04-26 MED ORDER — SODIUM CHLORIDE 0.9 % IJ SOLN
10.0000 mL | INTRAMUSCULAR | Status: DC | PRN
  Administered 2015-04-26: 10 mL
  Filled 2015-04-26: qty 10

## 2015-04-26 MED ORDER — FAMOTIDINE IN NACL 20-0.9 MG/50ML-% IV SOLN
INTRAVENOUS | Status: AC
  Filled 2015-04-26: qty 50

## 2015-04-26 MED ORDER — FAMOTIDINE IN NACL 20-0.9 MG/50ML-% IV SOLN
20.0000 mg | Freq: Once | INTRAVENOUS | Status: AC
  Administered 2015-04-26: 20 mg via INTRAVENOUS

## 2015-04-26 MED ORDER — PALONOSETRON HCL INJECTION 0.25 MG/5ML
INTRAVENOUS | Status: AC
  Filled 2015-04-26: qty 5

## 2015-04-26 NOTE — Telephone Encounter (Signed)
appt made and avs printed °

## 2015-04-26 NOTE — Progress Notes (Signed)
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 She is here to receive cycle 5 of treatment today. She had thrombocytopenia and neutropenia. All of those numbers have improved. Neuropathy is stable. She has poor appetite. She had lost 22 pounds all in all.  REVIEW OF SYSTEMS:   Constitutional: Denies fevers, chills or abnormal weight loss Eyes: Denies blurriness of vision Ears, nose, mouth, throat, and face: Denies mucositis or sore throat Respiratory: Denies cough, dyspnea or wheezes Cardiovascular: Denies palpitation, chest discomfort Gastrointestinal:  Denies nausea, heartburn or change in bowel habits Skin: Denies abnormal skin rashes Lymphatics: Denies new lymphadenopathy or easy bruising Neurological: Mild neuropathy Behavioral/Psych: Mood is stable, no new changes  Extremities: No lower extremity edema Breast: Improvement in the inflammatory breast cancer All other systems were reviewed with the patient and are negative.  I have reviewed the past medical history, past surgical history, social history and family  history with the patient and they are unchanged from previous note.  ALLERGIES:  has No Known Allergies.  MEDICATIONS:  Current Outpatient Prescriptions  Medication Sig Dispense Refill  . albuterol (PROVENTIL) (2.5 MG/3ML) 0.083% nebulizer solution Take 3 mLs (2.5 mg total) by nebulization every 4 (four) hours as needed for wheezing or shortness of breath. (Patient not taking: Reported on 03/22/2015) 75 mL 12  . ALPRAZolam (XANAX) 0.5 MG tablet Take 0.5 mg by mouth 3 (three) times daily as needed for anxiety or sleep.    [name] amLODipine (NORVASC) 5 MG tablet Take 5 mg by mouth 2 (two) times daily.    [name] ibuprofen (ADVIL,MOTRIN) 200 MG tablet Take 200 mg by mouth every 6 (six) hours as needed. Reported on 03/22/2015    . lidocaine-prilocaine (EMLA) cream Apply 1 application topically as needed. 30 g 0  . LORazepam (ATIVAN) 0.5 MG tablet Take 1 tablet (0.5 mg total) by mouth at bedtime. 30 tablet 0  . metoprolol (LOPRESSOR) 50 MG tablet Take 0.5 tablets (25 mg total) by mouth 2 (two) times daily. (Patient taking differently: Take 50 mg by mouth 2 (two) times daily. )    . ondansetron (ZOFRAN) 8 MG tablet TAKE 1 TABLET TWICE DAILY AS NEEDED STARTING 3RD DAY AFTER CHEMOTHERAPY 20 tablet 1  . oxyCODONE-acetaminophen (PERCOCET/ROXICET) 5-325 MG tablet Take 1 tablet by mouth every 8 (eight) hours as needed for severe pain. 60 tablet 0  . PARoxetine (PAXIL) 20 MG tablet Take 20 mg by mouth 2 (two) times daily.     . prochlorperazine (COMPAZINE) 10 MG tablet TAKE 1 TABLET (10 MG TOTAL) BY MOUTH EVERY 6 (SIX) HOURS AS NEEDED (NAUSEA OR VOMITING). 30 tablet 1   No current facility-administered  medications for this visit.    PHYSICAL EXAMINATION: ECOG PERFORMANCE STATUS: 1 - Symptomatic but completely ambulatory  Filed Vitals:   04/26/15 0858  BP: 145/94  Pulse: 99  Temp: 98 F (36.7 C)  Resp: 99   Filed Weights   04/26/15 0858  Weight: 193 lb 3.2 oz (87.635 kg)    GENERAL:alert, no distress and  comfortable SKIN: skin color, texture, turgor are normal, no rashes or significant lesions EYES: normal, Conjunctiva are pink and non-injected, sclera clear OROPHARYNX:no exudate, no erythema and lips, buccal mucosa, and tongue normal  NECK: supple, thyroid normal size, non-tender, without nodularity LYMPH:  no palpable lymphadenopathy in the cervical, axillary or inguinal LUNGS: clear to auscultation and percussion with normal breathing effort HEART: regular rate & rhythm and no murmurs and no lower extremity edema ABDOMEN:abdomen soft, non-tender and normal bowel sounds MUSCULOSKELETAL:no cyanosis of digits and no clubbing  NEURO: alert & oriented x 3 with fluent speech, no focal motor/sensory deficits EXTREMITIES: No lower extremity edema  LABORATORY DATA:  I have reviewed the data as listed   Chemistry      Component Value Date/Time   NA 142 04/19/2015 1153   NA 143 01/17/2015 1228   K 4.1 04/19/2015 1153   K 3.9 01/17/2015 1228   CL 106 01/17/2015 1228   CO2 26 04/19/2015 1153   CO2 26 01/17/2015 1228   BUN 10.3 04/19/2015 1153   BUN 15 01/17/2015 1228   CREATININE 0.8 04/19/2015 1153   CREATININE 0.78 01/17/2015 1228      Component Value Date/Time   CALCIUM 9.2 04/19/2015 1153   CALCIUM 9.5 01/17/2015 1228   ALKPHOS 70 04/19/2015 1153   ALKPHOS 55 10/29/2007 2156   AST 18 04/19/2015 1153   AST 18 10/29/2007 2156   ALT 10 04/19/2015 1153   ALT 12 10/29/2007 2156   BILITOT 0.66 04/19/2015 1153   BILITOT 0.5 10/29/2007 2156       Lab Results  Component Value Date   WBC 4.2 04/26/2015   HGB 9.8* 04/26/2015   HCT 29.9* 04/26/2015   MCV 103.4* 04/26/2015   PLT 144* 04/26/2015   NEUTROABS 2.9 04/26/2015     ASSESSMENT & PLAN:  Breast cancer of upper-outer quadrant of left female breast ([place]) Left breast biopsy 12/26/14: inflammatory breast cancer Invasive ductal carcinoma with lymphovascular invasion, 1/1 left axillary lymph node positive for metastatic  carcinoma, grade 3, ER/PR negative, Her 2 Negative Left breast palpable mass at 2:00: 4 x 3.8 x 2.5 cm, enlarged left axillary lymph node 4 cm in size with diffuse skin thickening T4 N1 (Stage 3B).  Treatment Plan: 1. Neoadjuvant chemotherapy with dose dense Adriamycin and Cytoxan 4 followed by Taxol and carboplatin 12 weekly 2. Followed by Mastectomy 3. Followed by Adjuvant XRT Patient wlll need Genetic counseling Ct scans and bone scans: do not show evidence of metastatic disease ------------------------------------------------------------------------------------------------------------------------------------------------------ Current treatment: completed 4 cycles of dose dense Adriamycin and Cytoxan today is cycle 5/12 Taxol (carboplatin was given for the first 4 cycles) Chemo Toxicities: 1. Nausea 2-3 days after chemotherapy I encouraged her to take the prescription for nausea medication that she currently has been. I will replace IV Zofran with IV Aloxi 2. Depression: With the loss of hair she has been very tearful. She is currently on Paxil and also take Xanax. 3. Alopecia 4. Bone pain related to Neulasta: Currently on Claritin 5. Severe fatigue due to chemotherapy 6. Chemotherapy-induced anemia monitoring very closely today's hemoglobin is 8.4. 7. Thrombocytopenia:  Delayed cycle 5.  discontinuing carboplatin from cycle 5 onwards  8. neutropenia: Delayed cycle 5. If this continues, we might give Neupogen injection on the Wednesday prior to chemotherapy.   monitoring closely for toxicities Pain related to inflammatory breast cancer: Much improved with pain medication. Occasionally still has sharp pain  RTC in 1 weeks for Cycle 6/12 Taxol   No orders of the defined types were placed in this encounter.   The patient has a good understanding of the overall plan. she agrees with it. she will call with any problems that may develop before the next visit here.   Rulon Eisenmenger,  MD 04/26/2015

## 2015-04-26 NOTE — Assessment & Plan Note (Signed)
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  Treatment Plan: 1. Neoadjuvant chemotherapy with dose dense Adriamycin and Cytoxan 4 followed by Taxol and carboplatin 12 weekly 2. Followed by Mastectomy 3. Followed by Adjuvant XRT Patient wlll need Genetic counseling Ct scans and bone scans: do not show evidence of metastatic disease ------------------------------------------------------------------------------------------------------------------------------------------------------ Current treatment: completed 4 cycles of dose dense Adriamycin and Cytoxan today is cycle 7/12 Taxol and carboplatin  Chemo Toxicities: 1. Nausea 2-3 days after chemotherapy I encouraged her to take the prescription for nausea medication that she currently has been. I will replace IV Zofran with IV Aloxi 2. Depression: With the loss of hair she has been very tearful. She is currently on Paxil and also take Xanax. 3. Alopecia 4. Bone pain related to Neulasta: Currently on Claritin 5. Severe fatigue due to chemotherapy 6. Chemotherapy-induced anemia monitoring very closely today's hemoglobin is 8.4.  monitoring closely for toxicities Pain related to inflammatory breast cancer: Much improved with pain medication. Occasionally still has sharp pain  RTC in 3 weeks for Cycle 9/12 Taxol and carboplatin

## 2015-04-26 NOTE — Patient Instructions (Signed)
[facility] Discharge Instructions for Patients Receiving Chemotherapy  Today you received the following chemotherapy agentstaxol,carboplatin To help prevent nausea and vomiting after your treatment, we encourage you to take your nausea medication {AS PRESCRIBED.  If you develop nausea and vomiting that is not controlled by your nausea medication, call the clinic.   BELOW ARE SYMPTOMS THAT SHOULD BE REPORTED IMMEDIATELY:  *FEVER GREATER THAN 100.5 F  *CHILLS WITH OR WITHOUT FEVER  NAUSEA AND VOMITING THAT IS NOT CONTROLLED WITH YOUR NAUSEA MEDICATION  *UNUSUAL SHORTNESS OF BREATH  *UNUSUAL BRUISING OR BLEEDING  TENDERNESS IN MOUTH AND THROAT WITH OR WITHOUT PRESENCE OF ULCERS  *URINARY PROBLEMS  *BOWEL PROBLEMS  UNUSUAL RASH Items with * indicate a potential emergency and should be followed up as soon as possible.  Feel free to call the clinic you have any questions or concerns. The clinic phone number is [phone].  Please show the [facility] [facility] at check-in to the Emergency Department and triage nurse.

## 2015-05-03 ENCOUNTER — Other Ambulatory Visit (HOSPITAL_BASED_OUTPATIENT_CLINIC_OR_DEPARTMENT_OTHER): Payer: Medicare Other

## 2015-05-03 ENCOUNTER — Ambulatory Visit (HOSPITAL_BASED_OUTPATIENT_CLINIC_OR_DEPARTMENT_OTHER): Payer: Medicare Other | Admitting: Hematology and Oncology

## 2015-05-03 ENCOUNTER — Encounter: Payer: Self-pay | Admitting: *Deleted

## 2015-05-03 ENCOUNTER — Telehealth: Payer: Self-pay | Admitting: Hematology and Oncology

## 2015-05-03 ENCOUNTER — Ambulatory Visit (HOSPITAL_BASED_OUTPATIENT_CLINIC_OR_DEPARTMENT_OTHER): Payer: Medicare Other

## 2015-05-03 ENCOUNTER — Encounter: Payer: Self-pay | Admitting: Hematology and Oncology

## 2015-05-03 VITALS — BP 135/89 | HR 97 | Temp 98.4°F | Resp 18 | Wt 193.9 lb

## 2015-05-03 DIAGNOSIS — C50412 Malignant neoplasm of upper-outer quadrant of left female breast: Secondary | ICD-10-CM

## 2015-05-03 DIAGNOSIS — Z5111 Encounter for antineoplastic chemotherapy: Secondary | ICD-10-CM

## 2015-05-03 DIAGNOSIS — D6481 Anemia due to antineoplastic chemotherapy: Secondary | ICD-10-CM

## 2015-05-03 DIAGNOSIS — M898X9 Other specified disorders of bone, unspecified site: Secondary | ICD-10-CM | POA: Diagnosis not present

## 2015-05-03 DIAGNOSIS — F329 Major depressive disorder, single episode, unspecified: Secondary | ICD-10-CM

## 2015-05-03 DIAGNOSIS — C773 Secondary and unspecified malignant neoplasm of axilla and upper limb lymph nodes: Secondary | ICD-10-CM

## 2015-05-03 DIAGNOSIS — D701 Agranulocytosis secondary to cancer chemotherapy: Secondary | ICD-10-CM

## 2015-05-03 LAB — COMPREHENSIVE METABOLIC PANEL
ALT: 13 U/L (ref 0–55)
ANION GAP: 8 meq/L (ref 3–11)
AST: 15 U/L (ref 5–34)
Albumin: 3.7 g/dL (ref 3.5–5.0)
Alkaline Phosphatase: 71 U/L (ref 40–150)
BUN: 11.1 mg/dL (ref 7.0–26.0)
CO2: 26 meq/L (ref 22–29)
CREATININE: 0.8 mg/dL (ref 0.6–1.1)
Calcium: 9.3 mg/dL (ref 8.4–10.4)
Chloride: 109 mEq/L (ref 98–109)
EGFR: >90 mL/min/{1.73_m2} (ref >90)
GLUCOSE: 105 mg/dL (ref 70–140)
Potassium: 3.8 mEq/L (ref 3.5–5.1)
Sodium: 143 mEq/L (ref 136–145)
TOTAL PROTEIN: 6.4 g/dL (ref 6.4–8.3)

## 2015-05-03 LAB — CBC WITH DIFFERENTIAL/PLATELET
BASO%: 0.6 % (ref 0.0–2.0)
Basophils Absolute: 0 10*3/uL (ref 0.0–0.1)
EOS%: 1.9 % (ref 0.0–7.0)
Eosinophils Absolute: 0.1 10*3/uL (ref 0.0–0.5)
HCT: 29.7 % — ABNORMAL LOW (ref 34.8–46.6)
HEMOGLOBIN: 9.8 g/dL — AB (ref 11.6–15.9)
LYMPH#: 1 10*3/uL (ref 0.9–3.3)
LYMPH%: 32.8 % (ref 14.0–49.7)
MCH: 34.3 pg — ABNORMAL HIGH (ref 25.1–34.0)
MCHC: 32.8 g/dL (ref 31.5–36.0)
MCV: 104.4 fL — ABNORMAL HIGH (ref 79.5–101.0)
MONO#: 0.2 10*3/uL (ref 0.1–0.9)
MONO%: 6.3 % (ref 0.0–14.0)
NEUT%: 58.4 % (ref 38.4–76.8)
NEUTROS ABS: 1.8 10*3/uL (ref 1.5–6.5)
PLATELETS: 220 10*3/uL (ref 145–400)
RBC: 2.84 10*6/uL — AB (ref 3.70–5.45)
RDW: 18.1 % — AB (ref 11.2–14.5)
WBC: 3 10*3/uL — AB (ref 3.9–10.3)

## 2015-05-03 MED ORDER — SODIUM CHLORIDE 0.9 % IJ SOLN
10.0000 mL | INTRAMUSCULAR | Status: DC | PRN
  Administered 2015-05-03: 10 mL
  Filled 2015-05-03: qty 10

## 2015-05-03 MED ORDER — FAMOTIDINE IN NACL 20-0.9 MG/50ML-% IV SOLN
20.0000 mg | Freq: Once | INTRAVENOUS | Status: AC
  Administered 2015-05-03: 20 mg via INTRAVENOUS

## 2015-05-03 MED ORDER — SODIUM CHLORIDE 0.9 % IV SOLN
Freq: Once | INTRAVENOUS | Status: AC
  Administered 2015-05-03: 12:00:00 via INTRAVENOUS
  Filled 2015-05-03: qty 1

## 2015-05-03 MED ORDER — PALONOSETRON HCL INJECTION 0.25 MG/5ML
INTRAVENOUS | Status: AC
  Filled 2015-05-03: qty 5

## 2015-05-03 MED ORDER — ALPRAZOLAM 0.5 MG PO TABS: 0.5000 mg | ORAL_TABLET | Freq: Three times a day (TID) | ORAL | Status: DC | PRN

## 2015-05-03 MED ORDER — HEPARIN SOD (PORK) LOCK FLUSH 100 UNIT/ML IV SOLN
500.0000 [IU] | Freq: Once | INTRAVENOUS | Status: AC | PRN
  Administered 2015-05-03: 500 [IU]
  Filled 2015-05-03: qty 5

## 2015-05-03 MED ORDER — SODIUM CHLORIDE 0.9 % IV SOLN
Freq: Once | INTRAVENOUS | Status: AC
Start: 2015-05-03 — End: 2015-05-03
  Administered 2015-05-03: 12:00:00 via INTRAVENOUS

## 2015-05-03 MED ORDER — FAMOTIDINE IN NACL 20-0.9 MG/50ML-% IV SOLN
INTRAVENOUS | Status: AC
  Filled 2015-05-03: qty 50

## 2015-05-03 MED ORDER — PALONOSETRON HCL INJECTION 0.25 MG/5ML
0.2500 mg | Freq: Once | INTRAVENOUS | Status: AC
  Administered 2015-05-03: 0.25 mg via INTRAVENOUS

## 2015-05-03 MED ORDER — SODIUM CHLORIDE 0.9 % IV SOLN
60.0000 mg/m2 | Freq: Once | INTRAVENOUS | Status: AC
  Administered 2015-05-03: 126 mg via INTRAVENOUS
  Filled 2015-05-03: qty 21

## 2015-05-03 NOTE — Progress Notes (Signed)
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 We had to be Losec 5 for neutropenia. We reduced the dosage of Paxil and she appears to be tolerating her [place] is 1900. She reports that she is tolerating the treatment much better.  REVIEW OF SYSTEMS:   Constitutional: Denies fevers, chills or abnormal weight loss Eyes: Denies blurriness of vision Ears, nose, mouth, throat, and face: Denies mucositis or sore throat Respiratory: Denies cough, dyspnea or wheezes Cardiovascular: Denies palpitation, chest discomfort Gastrointestinal:  Denies nausea, heartburn or change in bowel habits Skin: Denies abnormal skin rashes Lymphatics: Denies new lymphadenopathy or easy bruising Neurological:Denies numbness, tingling or new weaknesses Behavioral/Psych: Mood is stable, no new changes  Extremities: No lower extremity edema Breast:  denies any pain or lumps or nodules in either breasts All other systems were reviewed with the patient and are negative.  I have reviewed the past medical history, past surgical history, social history and family history  with the patient and they are unchanged from previous note.  ALLERGIES:  has No Known Allergies.  MEDICATIONS:  Current Outpatient Prescriptions  Medication Sig Dispense Refill  . albuterol (PROVENTIL) (2.5 MG/3ML) 0.083% nebulizer solution Take 3 mLs (2.5 mg total) by nebulization every 4 (four) hours as needed for wheezing or shortness of breath. (Patient not taking: Reported on 03/22/2015) 75 mL 12  . ALPRAZolam (XANAX) 0.5 MG tablet Take 1 tablet (0.5 mg total) by mouth 3 (three) times daily as needed for anxiety or sleep. 60 tablet 0  . amLODipine (NORVASC) 5 MG tablet Take 5 mg by mouth 2 (two) times daily.    [name] ibuprofen (ADVIL,MOTRIN) 200 MG tablet Take 200 mg by mouth every 6 (six) hours as needed. Reported on 03/22/2015    . lidocaine-prilocaine (EMLA) cream Apply 1 application topically as needed. 30 g 0  . LORazepam (ATIVAN) 0.5 MG tablet Take 1 tablet (0.5 mg total) by mouth at bedtime. 30 tablet 0  . metoprolol (LOPRESSOR) 50 MG tablet Take 0.5 tablets (25 mg total) by mouth 2 (two) times daily. (Patient taking differently: Take 50 mg by mouth 2 (two) times daily. )    . ondansetron (ZOFRAN) 8 MG tablet TAKE 1 TABLET TWICE DAILY AS NEEDED STARTING 3RD DAY AFTER CHEMOTHERAPY 20 tablet 1  . oxyCODONE-acetaminophen (PERCOCET/ROXICET) 5-325 MG tablet Take 1 tablet by mouth every 8 (eight) hours as needed for severe pain. 60 tablet 0  . PARoxetine (PAXIL) 20 MG tablet Take 20 mg by mouth 2 (two) times daily.     . prochlorperazine (COMPAZINE) 10 MG tablet TAKE 1 TABLET (10 MG TOTAL) BY MOUTH EVERY 6 (SIX) HOURS AS NEEDED (NAUSEA OR VOMITING). 30 tablet 1  No current facility-administered medications for this visit.    PHYSICAL EXAMINATION: ECOG PERFORMANCE STATUS: 1 - Symptomatic but completely ambulatory  Filed Vitals:   04/26/15 0859 05/03/15 1105  BP:  135/89  Pulse:  97  Temp:  98.4 F (36.9 C)  Resp: 18 18   Filed Weights   05/03/15 1105  Weight: 193 lb 14.4 oz (87.952  kg)    GENERAL:alert, no distress and comfortable SKIN: skin color, texture, turgor are normal, no rashes or significant lesions EYES: normal, Conjunctiva are pink and non-injected, sclera clear OROPHARYNX:no exudate, no erythema and lips, buccal mucosa, and tongue normal  NECK: supple, thyroid normal size, non-tender, without nodularity LYMPH:  no palpable lymphadenopathy in the cervical, axillary or inguinal LUNGS: clear to auscultation and percussion with normal breathing effort HEART: regular rate & rhythm and no murmurs and no lower extremity edema ABDOMEN:abdomen soft, non-tender and normal bowel sounds MUSCULOSKELETAL:no cyanosis of digits and no clubbing  NEURO: alert & oriented x 3 with fluent speech, no focal motor/sensory deficits EXTREMITIES: No lower extremity edema  LABORATORY DATA:  I have reviewed the data as listed   Chemistry      Component Value Date/Time   NA 145 04/26/2015 0841   NA 143 01/17/2015 1228   K 3.7 04/26/2015 0841   K 3.9 01/17/2015 1228   CL 106 01/17/2015 1228   CO2 24 04/26/2015 0841   CO2 26 01/17/2015 1228   BUN 11.2 04/26/2015 0841   BUN 15 01/17/2015 1228   CREATININE 0.8 04/26/2015 0841   CREATININE 0.78 01/17/2015 1228      Component Value Date/Time   CALCIUM 9.2 04/26/2015 0841   CALCIUM 9.5 01/17/2015 1228   ALKPHOS 79 04/26/2015 0841   ALKPHOS 55 10/29/2007 2156   AST 15 04/26/2015 0841   AST 18 10/29/2007 2156   ALT <9 04/26/2015 0841   ALT 12 10/29/2007 2156   BILITOT <0.30 04/26/2015 0841   BILITOT 0.5 10/29/2007 2156     Lab Results  Component Value Date   WBC 3.0* 05/03/2015   HGB 9.8* 05/03/2015   HCT 29.7* 05/03/2015   MCV 104.4* 05/03/2015   PLT 220 05/03/2015   NEUTROABS 1.8 05/03/2015   ASSESSMENT & PLAN:  Breast cancer of upper-outer quadrant of left female breast ([place]) Left breast biopsy 12/26/14: inflammatory breast cancer Invasive ductal carcinoma with lymphovascular invasion, 1/1 left axillary lymph  node positive for metastatic carcinoma, grade 3, ER/PR negative, Her 2 Negative Left breast palpable mass at 2:00: 4 x 3.8 x 2.5 cm, enlarged left axillary lymph node 4 cm in size with diffuse skin thickening T4 N1 (Stage 3B).  Treatment Plan: 1. Neoadjuvant chemotherapy with dose dense Adriamycin and Cytoxan 4 followed by Taxol and carboplatin 12 weekly 2. Followed by Mastectomy 3. Followed by Adjuvant XRT Patient wlll need Genetic counseling Ct scans and bone scans: do not show evidence of metastatic disease ------------------------------------------------------------------------------------------------------------------------------------------------------ Current treatment: completed 4 cycles of dose dense Adriamycin and Cytoxan today is cycle 6/12 Taxol (carboplatin was given for the first 4 cycles) Chemo Toxicities: 1. Nausea 2-3 days after chemotherapy I encouraged her to take the prescription for nausea medication that she currently has been. I will replace IV Zofran with IV Aloxi 2. Depression: With the loss of hair she has been very tearful. She is currently on Paxil and also take Xanax. 3. Alopecia 4. Bone pain related to Neulasta: Currently on Claritin 5. Severe fatigue due to chemotherapy 6. Chemotherapy-induced anemia monitoring very closely today's  hemoglobin is 8.4. 7. Thrombocytopenia: Delayed cycle 5. discontinuing carboplatin from cycle 5 onwards  8. neutropenia: Delayed cycle 5 and reduce the dosage off Taxol  monitoring closely for toxicities Pain related to inflammatory breast cancer: Much improved with pain medication. Occasionally still has sharp pain  RTC in 2 weeks for Cycle 8/12 Taxol     No orders of the defined types were placed in this encounter.   The patient has a good understanding of the overall plan. she agrees with it. she will call with any problems that may develop before the next visit here.   Rulon Eisenmenger, MD 05/03/2015

## 2015-05-03 NOTE — Telephone Encounter (Signed)
appt made and avs printed °

## 2015-05-03 NOTE — Progress Notes (Signed)
Unable to get in to exam room prior to MD.  No assessment performed.  

## 2015-05-03 NOTE — Assessment & Plan Note (Addendum)
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  Treatment Plan: 1. Neoadjuvant chemotherapy with dose dense Adriamycin and Cytoxan 4 followed by Taxol and carboplatin 12 weekly 2. Followed by Mastectomy 3. Followed by Adjuvant XRT Patient wlll need Genetic counseling Ct scans and bone scans: do not show evidence of metastatic disease ------------------------------------------------------------------------------------------------------------------------------------------------------ Current treatment: completed 4 cycles of dose dense Adriamycin and Cytoxan today is cycle 6/12 Taxol (carboplatin was given for the first 4 cycles) Chemo Toxicities: 1. Nausea 2-3 days after chemotherapy I encouraged her to take the prescription for nausea medication that she currently has been. I will replace IV Zofran with IV Aloxi 2. Depression: With the loss of hair she has been very tearful. She is currently on Paxil and also take Xanax. 3. Alopecia 4. Bone pain related to Neulasta: Currently on Claritin 5. Severe fatigue due to chemotherapy 6. Chemotherapy-induced anemia monitoring very closely today's hemoglobin is 8.4. 7. Thrombocytopenia: Delayed cycle 5. discontinuing carboplatin from cycle 5 onwards  8. neutropenia: Delayed cycle 5 and reduce the dosage off Taxol  monitoring closely for toxicities Pain related to inflammatory breast cancer: Much improved with pain medication. Occasionally still has sharp pain  RTC in 2 weeks for Cycle 8/12 Taxol

## 2015-05-03 NOTE — Patient Instructions (Signed)
Paclitaxel injection What is this medicine? PACLITAXEL (PAK li TAX el) is a chemotherapy drug. It targets fast dividing cells, like cancer cells, and causes these cells to die. This medicine is used to treat ovarian cancer, breast cancer, and other cancers. This medicine may be used for other purposes; ask your health care provider or pharmacist if you have questions. What should I tell my health care provider before I take this medicine? They need to know if you have any of these conditions: -blood disorders -irregular heartbeat -infection (especially a virus infection such as chickenpox, cold sores, or herpes) -liver disease -previous or ongoing radiation therapy -an unusual or allergic reaction to paclitaxel, alcohol, polyoxyethylated castor oil, other chemotherapy agents, other medicines, foods, dyes, or preservatives -pregnant or trying to get pregnant -breast-feeding How should I use this medicine? This drug is given as an infusion into a vein. It is administered in a hospital or clinic by a specially trained health care professional. Talk to your pediatrician regarding the use of this medicine in children. Special care may be needed. Overdosage: If you think you have taken too much of this medicine contact a poison control center or emergency room at once. NOTE: This medicine is only for you. Do not share this medicine with others. What if I miss a dose? It is important not to miss your dose. Call your doctor or health care professional if you are unable to keep an appointment. What may interact with this medicine? Do not take this medicine with any of the following medications: -disulfiram -metronidazole This medicine may also interact with the following medications: -cyclosporine -diazepam -ketoconazole -medicines to increase blood counts like filgrastim, pegfilgrastim, sargramostim -other chemotherapy drugs like cisplatin, doxorubicin, epirubicin, etoposide, teniposide,  vincristine -quinidine -testosterone -vaccines -verapamil Talk to your doctor or health care professional before taking any of these medicines: -acetaminophen -aspirin -ibuprofen -ketoprofen -naproxen This list may not describe all possible interactions. Give your health care provider a list of all the medicines, herbs, non-prescription drugs, or dietary supplements you use. Also tell them if you smoke, drink alcohol, or use illegal drugs. Some items may interact with your medicine. What should I watch for while using this medicine? Your condition will be monitored carefully while you are receiving this medicine. You will need important blood work done while you are taking this medicine. This drug may make you feel generally unwell. This is not uncommon, as chemotherapy can affect healthy cells as well as cancer cells. Report any side effects. Continue your course of treatment even though you feel ill unless your doctor tells you to stop. This medicine can cause serious allergic reactions. To reduce your risk you will need to take other medicine(s) before treatment with this medicine. In some cases, you may be given additional medicines to help with side effects. Follow all directions for their use. Call your doctor or health care professional for advice if you get a fever, chills or sore throat, or other symptoms of a cold or flu. Do not treat yourself. This drug decreases your body's ability to fight infections. Try to avoid being around people who are sick. This medicine may increase your risk to bruise or bleed. Call your doctor or health care professional if you notice any unusual bleeding. Be careful brushing and flossing your teeth or using a toothpick because you may get an infection or bleed more easily. If you have any dental work done, tell your dentist you are receiving this medicine. Avoid taking products that   contain aspirin, acetaminophen, ibuprofen, naproxen, or ketoprofen unless  instructed by your doctor. These medicines may hide a fever. Do not become pregnant while taking this medicine. Women should inform their doctor if they wish to become pregnant or think they might be pregnant. There is a potential for serious side effects to an unborn child. Talk to your health care professional or pharmacist for more information. Do not breast-feed an infant while taking this medicine. Men are advised not to father a child while receiving this medicine. This product may contain alcohol. Ask your pharmacist or healthcare provider if this medicine contains alcohol. Be sure to tell all healthcare providers you are taking this medicine. Certain medicines, like metronidazole and disulfiram, can cause an unpleasant reaction when taken with alcohol. The reaction includes flushing, headache, nausea, vomiting, sweating, and increased thirst. The reaction can last from 30 minutes to several hours. What side effects may I notice from receiving this medicine? Side effects that you should report to your doctor or health care professional as soon as possible: -allergic reactions like skin rash, itching or hives, swelling of the face, lips, or tongue -low blood counts - This drug may decrease the number of white blood cells, red blood cells and platelets. You may be at increased risk for infections and bleeding. -signs of infection - fever or chills, cough, sore throat, pain or difficulty passing urine -signs of decreased platelets or bleeding - bruising, pinpoint red spots on the skin, black, tarry stools, nosebleeds -signs of decreased red blood cells - unusually weak or tired, fainting spells, lightheadedness -breathing problems -chest pain -high or low blood pressure -mouth sores -nausea and vomiting -pain, swelling, redness or irritation at the injection site -pain, tingling, numbness in the hands or feet -slow or irregular heartbeat -swelling of the ankle, feet, hands Side effects that  usually do not require medical attention (report to your doctor or health care professional if they continue or are bothersome): -bone pain -complete hair loss including hair on your head, underarms, pubic hair, eyebrows, and eyelashes -changes in the color of fingernails -diarrhea -loosening of the fingernails -loss of appetite -muscle or joint pain -red flush to skin -sweating This list may not describe all possible side effects. Call your doctor for medical advice about side effects. You may report side effects to FDA at 1-800-FDA-1088. Where should I keep my medicine? This drug is given in a hospital or clinic and will not be stored at home. NOTE: This sheet is a summary. It may not cover all possible information. If you have questions about this medicine, talk to your doctor, pharmacist, or health care provider.    2016, [employer]. (2014-08-09 13:02:56)  

## 2015-05-06 ENCOUNTER — Encounter: Payer: Self-pay | Admitting: Hematology and Oncology

## 2015-05-06 NOTE — Progress Notes (Signed)
Left another msg for pt to return my call to discuss financial concerns she may have.

## 2015-05-10 ENCOUNTER — Ambulatory Visit: Payer: Medicare Other

## 2015-05-10 ENCOUNTER — Other Ambulatory Visit: Payer: Medicare Other

## 2015-05-17 ENCOUNTER — Ambulatory Visit (HOSPITAL_BASED_OUTPATIENT_CLINIC_OR_DEPARTMENT_OTHER): Payer: Medicare Other | Admitting: Hematology and Oncology

## 2015-05-17 ENCOUNTER — Telehealth: Payer: Self-pay | Admitting: Hematology and Oncology

## 2015-05-17 ENCOUNTER — Ambulatory Visit (HOSPITAL_BASED_OUTPATIENT_CLINIC_OR_DEPARTMENT_OTHER): Payer: Medicare Other

## 2015-05-17 ENCOUNTER — Encounter: Payer: Self-pay | Admitting: Hematology and Oncology

## 2015-05-17 ENCOUNTER — Other Ambulatory Visit (HOSPITAL_BASED_OUTPATIENT_CLINIC_OR_DEPARTMENT_OTHER): Payer: Medicare Other

## 2015-05-17 VITALS — BP 147/82 | HR 95 | Temp 97.9°F | Resp 20 | Ht 67.5 in | Wt 193.8 lb

## 2015-05-17 DIAGNOSIS — G622 Polyneuropathy due to other toxic agents: Secondary | ICD-10-CM

## 2015-05-17 DIAGNOSIS — C50412 Malignant neoplasm of upper-outer quadrant of left female breast: Secondary | ICD-10-CM

## 2015-05-17 DIAGNOSIS — C773 Secondary and unspecified malignant neoplasm of axilla and upper limb lymph nodes: Secondary | ICD-10-CM | POA: Diagnosis not present

## 2015-05-17 DIAGNOSIS — D6481 Anemia due to antineoplastic chemotherapy: Secondary | ICD-10-CM

## 2015-05-17 DIAGNOSIS — M545 Low back pain: Secondary | ICD-10-CM | POA: Diagnosis not present

## 2015-05-17 DIAGNOSIS — Z5112 Encounter for antineoplastic immunotherapy: Secondary | ICD-10-CM

## 2015-05-17 LAB — CBC WITH DIFFERENTIAL/PLATELET
BASO%: 0.5 % (ref 0.0–2.0)
BASOS ABS: 0 10*3/uL (ref 0.0–0.1)
EOS ABS: 0.1 10*3/uL (ref 0.0–0.5)
EOS%: 1.8 % (ref 0.0–7.0)
HEMATOCRIT: 32.2 % — AB (ref 34.8–46.6)
HEMOGLOBIN: 10.4 g/dL — AB (ref 11.6–15.9)
LYMPH#: 1.4 10*3/uL (ref 0.9–3.3)
LYMPH%: 31 % (ref 14.0–49.7)
MCH: 33.7 pg (ref 25.1–34.0)
MCHC: 32.3 g/dL (ref 31.5–36.0)
MCV: 104.2 fL — AB (ref 79.5–101.0)
MONO#: 0.6 10*3/uL (ref 0.1–0.9)
MONO%: 14.6 % — ABNORMAL HIGH (ref 0.0–14.0)
NEUT#: 2.3 10*3/uL (ref 1.5–6.5)
NEUT%: 52.1 % (ref 38.4–76.8)
Platelets: 260 10*3/uL (ref 145–400)
RBC: 3.09 10*6/uL — ABNORMAL LOW (ref 3.70–5.45)
RDW: 15.1 % — AB (ref 11.2–14.5)
WBC: 4.4 10*3/uL (ref 3.9–10.3)

## 2015-05-17 LAB — COMPREHENSIVE METABOLIC PANEL
ALBUMIN: 3.7 g/dL (ref 3.5–5.0)
ALK PHOS: 72 U/L (ref 40–150)
ALT: 11 U/L (ref 0–55)
AST: 14 U/L (ref 5–34)
Anion Gap: 8 mEq/L (ref 3–11)
BUN: 9.3 mg/dL (ref 7.0–26.0)
CALCIUM: 9.3 mg/dL (ref 8.4–10.4)
CO2: 25 mEq/L (ref 22–29)
Chloride: 109 mEq/L (ref 98–109)
Creatinine: 0.8 mg/dL (ref 0.6–1.1)
Glucose: 108 mg/dl (ref 70–140)
POTASSIUM: 4 meq/L (ref 3.5–5.1)
Sodium: 143 mEq/L (ref 136–145)
Total Bilirubin: <0.3 mg/dL (ref 0.20–1.20)
Total Protein: 6.9 g/dL (ref 6.4–8.3)

## 2015-05-17 MED ORDER — PALONOSETRON HCL INJECTION 0.25 MG/5ML
INTRAVENOUS | Status: AC
  Filled 2015-05-17: qty 5

## 2015-05-17 MED ORDER — SODIUM CHLORIDE 0.9 % IV SOLN
Freq: Once | INTRAVENOUS | Status: AC
  Administered 2015-05-17: 12:00:00 via INTRAVENOUS

## 2015-05-17 MED ORDER — SODIUM CHLORIDE 0.9 % IV SOLN
Freq: Once | INTRAVENOUS | Status: AC
  Administered 2015-05-17: 13:00:00 via INTRAVENOUS
  Filled 2015-05-17: qty 1

## 2015-05-17 MED ORDER — FAMOTIDINE IN NACL 20-0.9 MG/50ML-% IV SOLN
20.0000 mg | Freq: Once | INTRAVENOUS | Status: AC
  Administered 2015-05-17: 20 mg via INTRAVENOUS

## 2015-05-17 MED ORDER — HEPARIN SOD (PORK) LOCK FLUSH 100 UNIT/ML IV SOLN
500.0000 [IU] | Freq: Once | INTRAVENOUS | Status: AC | PRN
  Administered 2015-05-17: 500 [IU]
  Filled 2015-05-17: qty 5

## 2015-05-17 MED ORDER — FAMOTIDINE IN NACL 20-0.9 MG/50ML-% IV SOLN
INTRAVENOUS | Status: AC
  Filled 2015-05-17: qty 50

## 2015-05-17 MED ORDER — PALONOSETRON HCL INJECTION 0.25 MG/5ML
0.2500 mg | Freq: Once | INTRAVENOUS | Status: AC
  Administered 2015-05-17: 0.25 mg via INTRAVENOUS

## 2015-05-17 MED ORDER — SODIUM CHLORIDE 0.9 % IJ SOLN
10.0000 mL | INTRAMUSCULAR | Status: DC | PRN
  Administered 2015-05-17: 10 mL
  Filled 2015-05-17: qty 10

## 2015-05-17 MED ORDER — PACLITAXEL CHEMO INJECTION 300 MG/50ML
60.0000 mg/m2 | Freq: Once | INTRAVENOUS | Status: AC
  Administered 2015-05-17: 126 mg via INTRAVENOUS
  Filled 2015-05-17: qty 21

## 2015-05-17 MED ORDER — OXYCODONE-ACETAMINOPHEN 5-325 MG PO TABS: 1.0000 | ORAL_TABLET | Freq: Three times a day (TID) | ORAL | Status: DC | PRN

## 2015-05-17 NOTE — Patient Instructions (Signed)
[facility] Discharge Instructions for Patients Receiving Chemotherapy  Today you received the following chemotherapy agents:  Taxol  To help prevent nausea and vomiting after your treatment, we encourage you to take your nausea medication as prescribed.   If you develop nausea and vomiting that is not controlled by your nausea medication, call the clinic.   BELOW ARE SYMPTOMS THAT SHOULD BE REPORTED IMMEDIATELY:  *FEVER GREATER THAN 100.5 F  *CHILLS WITH OR WITHOUT FEVER  NAUSEA AND VOMITING THAT IS NOT CONTROLLED WITH YOUR NAUSEA MEDICATION  *UNUSUAL SHORTNESS OF BREATH  *UNUSUAL BRUISING OR BLEEDING  TENDERNESS IN MOUTH AND THROAT WITH OR WITHOUT PRESENCE OF ULCERS  *URINARY PROBLEMS  *BOWEL PROBLEMS  UNUSUAL RASH Items with * indicate a potential emergency and should be followed up as soon as possible.  Feel free to call the clinic you have any questions or concerns. The clinic phone number is [phone].  Please show the CHEMO ALERT CARD at check-in to the Emergency Department and triage nurse.   

## 2015-05-17 NOTE — Progress Notes (Signed)
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 She initially had inflammation tree breast cancer. Today is cycle 7 of Taxol. We had to drop carboplatin because of cytopenias. She appears to be tolerating single agent Taxol much better. She does complain of neuropathy at the tips of the fingers and the tips of the toes.   REVIEW OF SYSTEMS:   Constitutional: Denies fevers, chills or abnormal weight loss Eyes: Denies blurriness of vision Ears, nose, mouth, throat, and face: Denies mucositis or sore throat Respiratory: Denies cough, dyspnea or wheezes Cardiovascular: Denies palpitation, chest discomfort Gastrointestinal:  Denies nausea, heartburn or change in bowel habits Skin: Denies abnormal skin rashes Lymphatics: Denies new lymphadenopathy or easy bruising Neurological:mild neuropathy of the tips of the fingers and toes Behavioral/Psych: Mood is stable, no new changes  Extremities: No lower extremity edema  All other systems were reviewed with the patient and are negative.  I have reviewed the past medical history, past surgical history,  social history and family history with the patient and they are unchanged from previous note.  ALLERGIES:  has No Known Allergies.  MEDICATIONS:  Current Outpatient Prescriptions  Medication Sig Dispense Refill  . albuterol (PROVENTIL) (2.5 MG/3ML) 0.083% nebulizer solution Take 3 mLs (2.5 mg total) by nebulization every 4 (four) hours as needed for wheezing or shortness of breath. (Patient not taking: Reported on 03/22/2015) 75 mL 12  . ALPRAZolam (XANAX) 0.5 MG tablet Take 1 tablet (0.5 mg total) by mouth 3 (three) times daily as needed for anxiety or sleep. 60 tablet 0  . amLODipine (NORVASC) 5 MG tablet Take 5 mg by mouth 2 (two) times daily.    [name] ibuprofen (ADVIL,MOTRIN) 200 MG tablet Take 200 mg by mouth every 6 (six) hours as needed. Reported on 03/22/2015    . lidocaine-prilocaine (EMLA) cream Apply 1 application topically as needed. 30 g 0  . LORazepam (ATIVAN) 0.5 MG tablet Take 1 tablet (0.5 mg total) by mouth at bedtime. 30 tablet 0  . metoprolol (LOPRESSOR) 50 MG tablet Take 0.5 tablets (25 mg total) by mouth 2 (two) times daily. (Patient taking differently: Take 50 mg by mouth 2 (two) times daily. )    . ondansetron (ZOFRAN) 8 MG tablet TAKE 1 TABLET TWICE DAILY AS NEEDED STARTING 3RD DAY AFTER CHEMOTHERAPY 20 tablet 1  . oxyCODONE-acetaminophen (PERCOCET/ROXICET) 5-325 MG tablet Take 1 tablet by mouth every 8 (eight) hours as needed for severe pain. 60 tablet 0  . PARoxetine (PAXIL) 20 MG tablet Take 20 mg by mouth 2 (two) times daily.     . prochlorperazine (COMPAZINE) 10 MG tablet TAKE 1 TABLET (10 MG TOTAL) BY MOUTH EVERY 6 (SIX) HOURS AS  NEEDED (NAUSEA OR VOMITING). 30 tablet 1   No current facility-administered medications for this visit.   Facility-Administered Medications Ordered in Other Visits  Medication Dose Route Frequency Provider Last Rate Last Dose  . heparin lock flush 100 unit/mL  500 Units Intracatheter Once PRN Nicholas Lose, MD      . PACLitaxel (TAXOL) 126 mg  in sodium chloride 0.9 % 250 mL chemo infusion (</= 50m/m2)  60 mg/m2 (Treatment Plan Actual) Intravenous Once VNicholas Lose MD 271 mL/hr at 05/17/15 1312 126 mg at 05/17/15 1312  . sodium chloride 0.9 % injection 10 mL  10 mL Intracatheter PRN VNicholas Lose MD        PHYSICAL EXAMINATION: ECOG PERFORMANCE STATUS: 1 - Symptomatic but completely ambulatory  Filed Vitals:   05/17/15 1143  BP: 147/82  Pulse: 95  Temp: 97.9 F (36.6 C)  Resp: 20   Filed Weights   05/17/15 1143  Weight: 193 lb 12.8 oz (87.907 kg)    GENERAL:alert, no distress and comfortable SKIN: skin color, texture, turgor are normal, no rashes or significant lesions EYES: normal, Conjunctiva are pink and non-injected, sclera clear OROPHARYNX:no exudate, no erythema and lips, buccal mucosa, and tongue normal  NECK: supple, thyroid normal size, non-tender, without nodularity LYMPH:  no palpable lymphadenopathy in the cervical, axillary or inguinal LUNGS: clear to auscultation and percussion with normal breathing effort HEART: regular rate & rhythm and no murmurs and no lower extremity edema ABDOMEN:abdomen soft, non-tender and normal bowel sounds MUSCULOSKELETAL:no cyanosis of digits and no clubbing  NEURO: alert & oriented x 3 with fluent speech, neuropathy grade 1 EXTREMITIES: No lower extremity edema  LABORATORY DATA:  I have reviewed the data as listed   Chemistry      Component Value Date/Time   NA 143 05/17/2015 1133   NA 143 01/17/2015 1228   K 4.0 05/17/2015 1133   K 3.9 01/17/2015 1228   CL 106 01/17/2015 1228   CO2 25 05/17/2015 1133   CO2 26 01/17/2015 1228   BUN 9.3 05/17/2015 1133   BUN 15 01/17/2015 1228   CREATININE 0.8 05/17/2015 1133   CREATININE 0.78 01/17/2015 1228      Component Value Date/Time   CALCIUM 9.3 05/17/2015 1133   CALCIUM 9.5 01/17/2015 1228   ALKPHOS 72 05/17/2015 1133   ALKPHOS 55 10/29/2007 2156   AST 14 05/17/2015 1133   AST 18 10/29/2007 2156   ALT 11  05/17/2015 1133   ALT 12 10/29/2007 2156   BILITOT <0.30 05/17/2015 1133   BILITOT 0.5 10/29/2007 2156       Lab Results  Component Value Date   WBC 4.4 05/17/2015   HGB 10.4* 05/17/2015   HCT 32.2* 05/17/2015   MCV 104.2* 05/17/2015   PLT 260 05/17/2015   NEUTROABS 2.3 05/17/2015     ASSESSMENT & PLAN:  Breast cancer of upper-outer quadrant of [place] breast [place] breast biopsy 12/26/14: inflammatory breast cancer Invasive ductal carcinoma with lymphovascular invasion, 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  Treatment Plan: 1. Neoadjuvant chemotherapy with dose dense Adriamycin and Cytoxan 4 followed by Taxol and carboplatin 12 weekly 2. Followed by Mastectomy 3. Followed by Adjuvant XRT Patient wlll need Genetic counseling Ct scans and bone scans: do not show evidence of  metastatic disease ------------------------------------------------------------------------------------------------------------------------------------------------------ Current treatment: completed 4 cycles of dose dense Adriamycin and Cytoxan today is cycle 7/12 Taxol (carboplatin was given for the first 4 cycles) Chemo Toxicities: 1. Nausea 2-3 days after chemotherapy I encouraged her to take the prescription for nausea medication that she currently has been. I will replace IV Zofran with IV Aloxi 2. Depression: With the loss of hair she has been very tearful. She is currently on Paxil and also take Xanax. 3. Alopecia 4. Bone pain related to Neulasta: Currently on Claritin 5. Severe fatigue due to chemotherapy 6. Chemotherapy-induced anemia monitoring very closely today's hemoglobin is 8.4. 7. Thrombocytopenia: Delayed cycle 5. discontinuing carboplatin from cycle 5 onwards  8.  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 Complains of back pain, renewed her pain medication today. I encouraged her to take Naprosyn with the hope to slowly taper off and discontinue the narcotics.  RTC in 2 weeks for Cycle 9/12 Taxol    No orders of the defined types were placed in this encounter.   The patient has a good understanding of the overall plan. she agrees with it. she will call with any problems that may develop before the next visit here.   Rulon Eisenmenger, MD 05/17/2015

## 2015-05-17 NOTE — Assessment & Plan Note (Signed)
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  Treatment Plan: 1. Neoadjuvant chemotherapy with dose dense Adriamycin and Cytoxan 4 followed by Taxol and carboplatin 12 weekly 2. Followed by Mastectomy 3. Followed by Adjuvant XRT Patient wlll need Genetic counseling Ct scans and bone scans: do not show evidence of metastatic disease ------------------------------------------------------------------------------------------------------------------------------------------------------ Current treatment: completed 4 cycles of dose dense Adriamycin and Cytoxan today is cycle 8/12 Taxol (carboplatin was given for the first 4 cycles) Chemo Toxicities: 1. Nausea 2-3 days after chemotherapy I encouraged her to take the prescription for nausea medication that she currently has been. I will replace IV Zofran with IV Aloxi 2. Depression: With the loss of hair she has been very tearful. She is currently on Paxil and also take Xanax. 3. Alopecia 4. Bone pain related to Neulasta: Currently on Claritin 5. Severe fatigue due to chemotherapy 6. Chemotherapy-induced anemia monitoring very closely today's hemoglobin is 8.4. 7. Thrombocytopenia: Delayed cycle 5. discontinuing carboplatin from cycle 5 onwards  8. neutropenia: Delayed cycle 5 and reduce the dosage off Taxol  monitoring closely for toxicities Pain related to inflammatory breast cancer: Much improved with pain medication. Occasionally still has sharp pain  RTC in 2 weeks for Cycle 10/12 Taxol

## 2015-05-17 NOTE — Telephone Encounter (Signed)
appt made and avs printed °

## 2015-05-24 ENCOUNTER — Other Ambulatory Visit (HOSPITAL_BASED_OUTPATIENT_CLINIC_OR_DEPARTMENT_OTHER): Payer: Medicare Other

## 2015-05-24 ENCOUNTER — Ambulatory Visit (HOSPITAL_BASED_OUTPATIENT_CLINIC_OR_DEPARTMENT_OTHER): Payer: Medicare Other

## 2015-05-24 VITALS — BP 136/94 | HR 96 | Temp 98.5°F | Resp 18 | Ht 67.5 in

## 2015-05-24 DIAGNOSIS — C50412 Malignant neoplasm of upper-outer quadrant of left female breast: Secondary | ICD-10-CM | POA: Diagnosis not present

## 2015-05-24 DIAGNOSIS — Z5111 Encounter for antineoplastic chemotherapy: Secondary | ICD-10-CM

## 2015-05-24 LAB — CBC WITH DIFFERENTIAL/PLATELET
BASO%: 0.6 % (ref 0.0–2.0)
Basophils Absolute: 0 10*3/uL (ref 0.0–0.1)
EOS%: 2.4 % (ref 0.0–7.0)
Eosinophils Absolute: 0.1 10*3/uL (ref 0.0–0.5)
HEMATOCRIT: 33.7 % — AB (ref 34.8–46.6)
HGB: 11 g/dL — ABNORMAL LOW (ref 11.6–15.9)
LYMPH#: 1.4 10*3/uL (ref 0.9–3.3)
LYMPH%: 22.6 % (ref 14.0–49.7)
MCH: 33.7 pg (ref 25.1–34.0)
MCHC: 32.7 g/dL (ref 31.5–36.0)
MCV: 103.1 fL — AB (ref 79.5–101.0)
MONO#: 0.5 10*3/uL (ref 0.1–0.9)
MONO%: 7.6 % (ref 0.0–14.0)
NEUT#: 4.1 10*3/uL (ref 1.5–6.5)
NEUT%: 66.8 % (ref 38.4–76.8)
PLATELETS: 308 10*3/uL (ref 145–400)
RBC: 3.27 10*6/uL — AB (ref 3.70–5.45)
RDW: 16.2 % — ABNORMAL HIGH (ref 11.2–14.5)
WBC: 6.1 10*3/uL (ref 3.9–10.3)

## 2015-05-24 LAB — COMPREHENSIVE METABOLIC PANEL
ALT: 10 U/L (ref 0–55)
ANION GAP: 7 meq/L (ref 3–11)
AST: 14 U/L (ref 5–34)
Albumin: 3.8 g/dL (ref 3.5–5.0)
Alkaline Phosphatase: 66 U/L (ref 40–150)
BUN: 12.9 mg/dL (ref 7.0–26.0)
CALCIUM: 9.4 mg/dL (ref 8.4–10.4)
CHLORIDE: 108 meq/L (ref 98–109)
CO2: 26 mEq/L (ref 22–29)
CREATININE: 0.8 mg/dL (ref 0.6–1.1)
EGFR: 89 mL/min/{1.73_m2} — AB (ref >90)
Glucose: 106 mg/dl (ref 70–140)
Potassium: 4.2 mEq/L (ref 3.5–5.1)
Sodium: 141 mEq/L (ref 136–145)
Total Bilirubin: <0.3 mg/dL (ref 0.20–1.20)
Total Protein: 6.7 g/dL (ref 6.4–8.3)

## 2015-05-24 MED ORDER — PALONOSETRON HCL INJECTION 0.25 MG/5ML
INTRAVENOUS | Status: AC
  Filled 2015-05-24: qty 5

## 2015-05-24 MED ORDER — SODIUM CHLORIDE 0.9 % IJ SOLN
10.0000 mL | INTRAMUSCULAR | Status: DC | PRN
  Administered 2015-05-24: 10 mL
  Filled 2015-05-24: qty 10

## 2015-05-24 MED ORDER — PALONOSETRON HCL INJECTION 0.25 MG/5ML
0.2500 mg | Freq: Once | INTRAVENOUS | Status: AC
Start: 2015-05-24 — End: 2015-05-24
  Administered 2015-05-24: 0.25 mg via INTRAVENOUS

## 2015-05-24 MED ORDER — HEPARIN SOD (PORK) LOCK FLUSH 100 UNIT/ML IV SOLN
500.0000 [IU] | Freq: Once | INTRAVENOUS | Status: AC | PRN
  Administered 2015-05-24: 500 [IU]
  Filled 2015-05-24: qty 5

## 2015-05-24 MED ORDER — FAMOTIDINE IN NACL 20-0.9 MG/50ML-% IV SOLN
INTRAVENOUS | Status: AC
  Filled 2015-05-24: qty 50

## 2015-05-24 MED ORDER — SODIUM CHLORIDE 0.9 % IV SOLN
Freq: Once | INTRAVENOUS | Status: AC
  Administered 2015-05-24: 13:00:00 via INTRAVENOUS

## 2015-05-24 MED ORDER — SODIUM CHLORIDE 0.9 % IV SOLN
60.0000 mg/m2 | Freq: Once | INTRAVENOUS | Status: AC
  Administered 2015-05-24: 126 mg via INTRAVENOUS
  Filled 2015-05-24: qty 21

## 2015-05-24 MED ORDER — FAMOTIDINE IN NACL 20-0.9 MG/50ML-% IV SOLN
20.0000 mg | Freq: Once | INTRAVENOUS | Status: AC
  Administered 2015-05-24: 20 mg via INTRAVENOUS

## 2015-05-24 MED ORDER — DEXAMETHASONE SODIUM PHOSPHATE 100 MG/10ML IJ SOLN
Freq: Once | INTRAMUSCULAR | Status: AC
  Administered 2015-05-24: 13:00:00 via INTRAVENOUS
  Filled 2015-05-24: qty 1

## 2015-05-24 NOTE — Progress Notes (Signed)
Patient reports a "burning" sensation in her mouth every time she eats or drinks anything. Denies any sores, bleeding, or difficulty swallowing.  No sores noted on exam.  Dr. Lindi Adie notified and order received to send rx in for magic mouthwash, 5 ml, bid, prn for 100 ml.  Freada Bergeron, RN will send that rx in to patient's pharmacy.  Pt verbalized understanding.

## 2015-05-24 NOTE — Patient Instructions (Signed)
[facility] Discharge Instructions for Patients Receiving Chemotherapy  Today you received the following chemotherapy agents Taxol   To help prevent nausea and vomiting after your treatment, we encourage you to take your nausea medication as directed.   If you develop nausea and vomiting that is not controlled by your nausea medication, call the clinic.   BELOW ARE SYMPTOMS THAT SHOULD BE REPORTED IMMEDIATELY:  *FEVER GREATER THAN 100.5 F  *CHILLS WITH OR WITHOUT FEVER  NAUSEA AND VOMITING THAT IS NOT CONTROLLED WITH YOUR NAUSEA MEDICATION  *UNUSUAL SHORTNESS OF BREATH  *UNUSUAL BRUISING OR BLEEDING  TENDERNESS IN MOUTH AND THROAT WITH OR WITHOUT PRESENCE OF ULCERS  *URINARY PROBLEMS  *BOWEL PROBLEMS  UNUSUAL RASH Items with * indicate a potential emergency and should be followed up as soon as possible.  Feel free to call the clinic you have any questions or concerns. The clinic phone number is [phone].  Please show the CHEMO ALERT CARD at check-in to the Emergency Department and triage nurse.   

## 2015-05-30 ENCOUNTER — Ambulatory Visit (HOSPITAL_BASED_OUTPATIENT_CLINIC_OR_DEPARTMENT_OTHER): Payer: Medicare Other

## 2015-05-30 ENCOUNTER — Encounter: Payer: Self-pay | Admitting: Hematology and Oncology

## 2015-05-30 ENCOUNTER — Telehealth: Payer: Self-pay | Admitting: Hematology and Oncology

## 2015-05-30 ENCOUNTER — Ambulatory Visit (HOSPITAL_BASED_OUTPATIENT_CLINIC_OR_DEPARTMENT_OTHER): Payer: Medicare Other | Admitting: Hematology and Oncology

## 2015-05-30 ENCOUNTER — Other Ambulatory Visit (HOSPITAL_BASED_OUTPATIENT_CLINIC_OR_DEPARTMENT_OTHER): Payer: Medicare Other

## 2015-05-30 VITALS — BP 153/101 | HR 90 | Temp 98.3°F | Resp 18 | Ht 67.5 in | Wt 191.2 lb

## 2015-05-30 DIAGNOSIS — M545 Low back pain: Secondary | ICD-10-CM

## 2015-05-30 DIAGNOSIS — C50412 Malignant neoplasm of upper-outer quadrant of left female breast: Secondary | ICD-10-CM

## 2015-05-30 DIAGNOSIS — Z5111 Encounter for antineoplastic chemotherapy: Secondary | ICD-10-CM | POA: Diagnosis present

## 2015-05-30 DIAGNOSIS — D6481 Anemia due to antineoplastic chemotherapy: Secondary | ICD-10-CM

## 2015-05-30 DIAGNOSIS — G622 Polyneuropathy due to other toxic agents: Secondary | ICD-10-CM | POA: Diagnosis not present

## 2015-05-30 DIAGNOSIS — C773 Secondary and unspecified malignant neoplasm of axilla and upper limb lymph nodes: Secondary | ICD-10-CM

## 2015-05-30 LAB — COMPREHENSIVE METABOLIC PANEL
ALBUMIN: 4 g/dL (ref 3.5–5.0)
ALK PHOS: 61 U/L (ref 40–150)
ALT: 9 U/L (ref 0–55)
ANION GAP: 10 meq/L (ref 3–11)
AST: 13 U/L (ref 5–34)
BILIRUBIN TOTAL: 0.32 mg/dL (ref 0.20–1.20)
BUN: 13.6 mg/dL (ref 7.0–26.0)
CALCIUM: 9.4 mg/dL (ref 8.4–10.4)
CO2: 24 meq/L (ref 22–29)
CREATININE: 0.8 mg/dL (ref 0.6–1.1)
Chloride: 109 mEq/L (ref 98–109)
EGFR: >90 mL/min/{1.73_m2} (ref >90)
Glucose: 103 mg/dl (ref 70–140)
Potassium: 3.9 mEq/L (ref 3.5–5.1)
Sodium: 142 mEq/L (ref 136–145)
TOTAL PROTEIN: 6.9 g/dL (ref 6.4–8.3)

## 2015-05-30 LAB — CBC WITH DIFFERENTIAL/PLATELET
BASO%: 0.6 % (ref 0.0–2.0)
Basophils Absolute: 0 10*3/uL (ref 0.0–0.1)
EOS%: 2.4 % (ref 0.0–7.0)
Eosinophils Absolute: 0.1 10*3/uL (ref 0.0–0.5)
HEMATOCRIT: 34.4 % — AB (ref 34.8–46.6)
HEMOGLOBIN: 11.3 g/dL — AB (ref 11.6–15.9)
LYMPH#: 1 10*3/uL (ref 0.9–3.3)
LYMPH%: 30.2 % (ref 14.0–49.7)
MCH: 33.7 pg (ref 25.1–34.0)
MCHC: 32.8 g/dL (ref 31.5–36.0)
MCV: 102.7 fL — ABNORMAL HIGH (ref 79.5–101.0)
MONO#: 0.2 10*3/uL (ref 0.1–0.9)
MONO%: 6.2 % (ref 0.0–14.0)
NEUT#: 2.1 10*3/uL (ref 1.5–6.5)
NEUT%: 60.6 % (ref 38.4–76.8)
Platelets: 256 10*3/uL (ref 145–400)
RBC: 3.35 10*6/uL — ABNORMAL LOW (ref 3.70–5.45)
RDW: 15.2 % — AB (ref 11.2–14.5)
WBC: 3.4 10*3/uL — AB (ref 3.9–10.3)

## 2015-05-30 MED ORDER — HEPARIN SOD (PORK) LOCK FLUSH 100 UNIT/ML IV SOLN
500.0000 [IU] | Freq: Once | INTRAVENOUS | Status: AC | PRN
  Administered 2015-05-30: 500 [IU]
  Filled 2015-05-30: qty 5

## 2015-05-30 MED ORDER — PACLITAXEL CHEMO INJECTION 300 MG/50ML: 60.0000 mg/m2 | Freq: Once | INTRAVENOUS | Status: DC

## 2015-05-30 MED ORDER — SODIUM CHLORIDE 0.9 % IJ SOLN
10.0000 mL | INTRAMUSCULAR | Status: DC | PRN
  Administered 2015-05-30: 10 mL
  Filled 2015-05-30: qty 10

## 2015-05-30 MED ORDER — PALONOSETRON HCL INJECTION 0.25 MG/5ML
INTRAVENOUS | Status: AC
  Filled 2015-05-30: qty 5

## 2015-05-30 MED ORDER — SODIUM CHLORIDE 0.9 % IV SOLN
60.0000 mg/m2 | Freq: Once | INTRAVENOUS | Status: AC
  Administered 2015-05-30: 126 mg via INTRAVENOUS
  Filled 2015-05-30: qty 21

## 2015-05-30 MED ORDER — FAMOTIDINE IN NACL 20-0.9 MG/50ML-% IV SOLN
20.0000 mg | Freq: Once | INTRAVENOUS | Status: AC
  Administered 2015-05-30: 20 mg via INTRAVENOUS

## 2015-05-30 MED ORDER — FAMOTIDINE IN NACL 20-0.9 MG/50ML-% IV SOLN
INTRAVENOUS | Status: AC
  Filled 2015-05-30: qty 50

## 2015-05-30 MED ORDER — SODIUM CHLORIDE 0.9 % IV SOLN
Freq: Once | INTRAVENOUS | Status: AC
  Administered 2015-05-30: 12:00:00 via INTRAVENOUS

## 2015-05-30 MED ORDER — PALONOSETRON HCL INJECTION 0.25 MG/5ML
0.2500 mg | Freq: Once | INTRAVENOUS | Status: AC
  Administered 2015-05-30: 0.25 mg via INTRAVENOUS

## 2015-05-30 MED ORDER — SODIUM CHLORIDE 0.9 % IV SOLN
Freq: Once | INTRAVENOUS | Status: AC
  Administered 2015-05-30: 12:00:00 via INTRAVENOUS
  Filled 2015-05-30: qty 1

## 2015-05-30 NOTE — Assessment & Plan Note (Signed)
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  Treatment Plan: 1. Neoadjuvant chemotherapy with dose dense Adriamycin and Cytoxan 4 followed by Taxol and carboplatin 12 weekly 2. Followed by Mastectomy 3. Followed by Adjuvant XRT Patient wlll need Genetic counseling Ct scans and bone scans: do not show evidence of metastatic disease ------------------------------------------------------------------------------------------------------------------------------------------------------ Current treatment: completed 4 cycles of dose dense Adriamycin and Cytoxan today is cycle 9/12 Taxol (carboplatin was given for the first 4 cycles) Chemo Toxicities: 1. Nausea 2-3 days after chemotherapy I encouraged her to take the prescription for nausea medication that she currently has been. I will replace IV Zofran with IV Aloxi 2. Depression: With the loss of hair she has been very tearful. She is currently on Paxil and also take Xanax. 3. Alopecia 4. Bone pain related to Neulasta: Currently on Claritin 5. Severe fatigue due to chemotherapy 6. Chemotherapy-induced anemia monitoring very closely today's hemoglobin is 8.4. 7. Thrombocytopenia: Delayed cycle 5. discontinuing carboplatin from cycle 5 onwards  8. 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 Complains of back pain, renewed her pain medication today. I encouraged her to take Naprosyn with the hope to slowly taper off and discontinue  the narcotics.  I will arrange for post neoadjuvant breast MRI and follow-up after that with surgery and myself RTC in 2 weeks for Cycle 11/12 Taxol

## 2015-05-30 NOTE — Patient Instructions (Signed)
[facility] Discharge Instructions for Patients Receiving Chemotherapy  Today you received the following chemotherapy agents Taxol   To help prevent nausea and vomiting after your treatment, we encourage you to take your nausea medication as directed.   If you develop nausea and vomiting that is not controlled by your nausea medication, call the clinic.   BELOW ARE SYMPTOMS THAT SHOULD BE REPORTED IMMEDIATELY:  *FEVER GREATER THAN 100.5 F  *CHILLS WITH OR WITHOUT FEVER  NAUSEA AND VOMITING THAT IS NOT CONTROLLED WITH YOUR NAUSEA MEDICATION  *UNUSUAL SHORTNESS OF BREATH  *UNUSUAL BRUISING OR BLEEDING  TENDERNESS IN MOUTH AND THROAT WITH OR WITHOUT PRESENCE OF ULCERS  *URINARY PROBLEMS  *BOWEL PROBLEMS  UNUSUAL RASH Items with * indicate a potential emergency and should be followed up as soon as possible.  Feel free to call the clinic you have any questions or concerns. The clinic phone number is [phone].  Please show the CHEMO ALERT CARD at check-in to the Emergency Department and triage nurse.   

## 2015-05-30 NOTE — Progress Notes (Signed)
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 She is tolerating the treatment very well. Does have mild neuropathy in the fingers.   REVIEW OF SYSTEMS:   Constitutional: Denies fevers, chills or abnormal weight loss Eyes: Denies blurriness of vision Ears, nose, mouth, throat, and face: Denies mucositis or sore throat Respiratory: Denies cough, dyspnea or wheezes Cardiovascular: Denies palpitation, chest discomfort Gastrointestinal:  Denies nausea, heartburn or change in bowel habits Skin: Denies abnormal skin rashes Lymphatics: Denies new lymphadenopathy or easy bruising NeuroloMild neuropathy in the fingers Behavioral/Psych: Mood is stable, no new changes  Extremities: No lower extremity edema  All other systems were reviewed with the patient and are negative.  I have reviewed the past medical history, past surgical history, social history and family history with the patient and they are unchanged from previous note.  ALLERGIES:  has No Known Allergies.  MEDICATIONS:  Current Outpatient Prescriptions  Medication  Sig Dispense Refill  . albuterol (PROVENTIL) (2.5 MG/3ML) 0.083% nebulizer solution Take 3 mLs (2.5 mg total) by nebulization every 4 (four) hours as needed for wheezing or shortness of breath. (Patient not taking: Reported on 03/22/2015) 75 mL 12  . ALPRAZolam (XANAX) 0.5 MG tablet Take 1 tablet (0.5 mg total) by mouth 3 (three) times daily as needed for anxiety or sleep. 60 tablet 0  . amLODipine (NORVASC) 5 MG tablet Take 5 mg by mouth 2 (two) times daily.    [name] ibuprofen (ADVIL,MOTRIN) 200 MG tablet Take 200 mg by mouth every 6 (six) hours as needed. Reported on 03/22/2015    . lidocaine-prilocaine (EMLA) cream Apply 1 application topically as needed. 30 g 0  . LORazepam (ATIVAN) 0.5 MG tablet Take 1 tablet (0.5 mg total) by mouth at bedtime. 30 tablet 0  . magic mouthwash w/lidocaine SOLN Take 5 mLs by mouth 2 (two) times daily as needed for mouth pain.    . metoprolol (LOPRESSOR) 50 MG tablet Take 0.5 tablets (25 mg total) by mouth 2 (two) times daily. (Patient taking differently: Take 50 mg by mouth 2 (two) times daily. )    . ondansetron (ZOFRAN) 8 MG tablet TAKE 1 TABLET TWICE DAILY AS NEEDED STARTING 3RD DAY AFTER CHEMOTHERAPY 20 tablet 1  . oxyCODONE-acetaminophen (PERCOCET/ROXICET) 5-325 MG tablet Take 1 tablet by mouth every 8 (eight) hours as needed for severe pain. 60 tablet 0  . PARoxetine (PAXIL) 20 MG tablet Take 20 mg by mouth 2 (two) times daily.     . prochlorperazine (COMPAZINE) 10 MG tablet TAKE 1 TABLET (10 MG TOTAL) BY MOUTH EVERY 6 (SIX) HOURS AS NEEDED (NAUSEA OR VOMITING). 30 tablet 1  No current facility-administered medications for this visit.    PHYSICAL EXAMINATION: ECOG PERFORMANCE STATUS: 1 - Symptomatic but completely ambulatory  Filed Vitals:   05/30/15 1055  BP: 153/101  Pulse: 90  Temp: 98.3 F (36.8 C)  Resp: 18   Filed Weights   05/30/15 1055  Weight: 191 lb 3.2 oz (86.728 kg)    GENERAL:alert, no distress and comfortable SKIN: skin color,  texture, turgor are normal, no rashes or significant lesions EYES: normal, Conjunctiva are pink and non-injected, sclera clear OROPHARYNX:no exudate, no erythema and lips, buccal mucosa, and tongue normal  NECK: supple, thyroid normal size, non-tender, without nodularity LYMPH:  no palpable lymphadenopathy in the cervical, axillary or inguinal LUNGS: clear to auscultation and percussion with normal breathing effort HEART: regular rate & rhythm and no murmurs and no lower extremity edema ABDOMEN:abdomen soft, non-tender and normal bowel sounds MUSCULOSKELETAL:no cyanosis of digits and no clubbing  NEURO: alert & oriented x 3 with fluent speech, no focal motor/sensory deficits EXTREMITIES: No lower extremity edema  LABORATORY DATA:  I have reviewed the data as listed   Chemistry      Component Value Date/Time   NA 141 05/24/2015 1215   NA 143 01/17/2015 1228   K 4.2 05/24/2015 1215   K 3.9 01/17/2015 1228   CL 106 01/17/2015 1228   CO2 26 05/24/2015 1215   CO2 26 01/17/2015 1228   BUN 12.9 05/24/2015 1215   BUN 15 01/17/2015 1228   CREATININE 0.8 05/24/2015 1215   CREATININE 0.78 01/17/2015 1228      Component Value Date/Time   CALCIUM 9.4 05/24/2015 1215   CALCIUM 9.5 01/17/2015 1228   ALKPHOS 66 05/24/2015 1215   ALKPHOS 55 10/29/2007 2156   AST 14 05/24/2015 1215   AST 18 10/29/2007 2156   ALT 10 05/24/2015 1215   ALT 12 10/29/2007 2156   BILITOT <0.30 05/24/2015 1215   BILITOT 0.5 10/29/2007 2156       Lab Results  Component Value Date   WBC 3.4* 05/30/2015   HGB 11.3* 05/30/2015   HCT 34.4* 05/30/2015   MCV 102.7* 05/30/2015   PLT 256 05/30/2015   NEUTROABS 2.1 05/30/2015     ASSESSMENT & PLAN:  Breast cancer of upper-outer quadrant of left female breast ([place]) Left breast biopsy 12/26/14: inflammatory breast cancer Invasive ductal carcinoma with lymphovascular invasion, 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, grade 3, ER/PR negative,  Her 2 Negative Left breast palpable mass at 2:00: 4 x 3.8 x 2.5 cm, enlarged left axillary lymph node 4 cm in size with diffuse skin thickening T4 N1 (Stage 3B).  Treatment Plan: 1. Neoadjuvant chemotherapy with dose dense Adriamycin and Cytoxan 4 followed by Taxol and carboplatin 12 weekly 2. Followed by Mastectomy 3. Followed by Adjuvant XRT Patient wlll need Genetic counseling Ct scans and bone scans: do not show evidence of metastatic disease ------------------------------------------------------------------------------------------------------------------------------------------------------ Current treatment: completed 4 cycles of dose dense Adriamycin and Cytoxan today is cycle 9/12 Taxol (carboplatin was given for the first 4 cycles) Chemo Toxicities: 1. Nausea 2-3 days after chemotherapy I encouraged her to take the prescription for nausea medication that she currently has been. I will replace IV Zofran with IV Aloxi 2. Depression: With the loss of hair she has been very tearful. She is currently on Paxil and also take Xanax. 3. Alopecia 4. Bone pain related to Neulasta: Currently on Claritin 5. Severe fatigue due to chemotherapy 6. Chemotherapy-induced anemia monitoring very closely today's hemoglobin is  8.4. 7. Thrombocytopenia: Delayed cycle 5. discontinuing carboplatin from cycle 5 onwards  8. 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 Complains of back pain, renewed her pain medication today. I encouraged her to take Naprosyn with the hope to slowly taper off and discontinue the narcotics.  I will arrange for post neoadjuvant breast MRI and follow-up after that with surgery and myself RTC in 2 weeks for Cycle 11/12 Taxol     Orders Placed This Encounter  Procedures    . MR Breast Bilateral W Wo Contrast    EPIC ORDER/ DX: LT BREAST CA/PF: 02/04/15 IN EPIC/  05/30/15 LMOM TO SCHEDULE/CLC    Standing Status: Future     Number of Occurrences:      Standing Expiration Date: 07/29/2016    Order Specific Question:  If indicated for the ordered procedure, I authorize the administration of contrast media per Radiology protocol    Answer:  Yes    Order Specific Question:  Reason for Exam (SYMPTOM  OR DIAGNOSIS REQUIRED)    Answer:  Post neo adj chemo    Order Specific Question:  Preferred imaging location?    Answer:  [facility] (table limit-550lbs)    Order Specific Question:  What is the patient's sedation requirement?    Answer:  No Sedation    Order Specific Question:  Does the patient have a pacemaker or implanted devices?    Answer:  No   The patient has a good understanding of the overall plan. she agrees with it. she will call with any problems that may develop before the next visit here.   Rulon Eisenmenger, MD 05/30/2015

## 2015-05-30 NOTE — Telephone Encounter (Signed)
appt made and avs printed °

## 2015-06-07 ENCOUNTER — Other Ambulatory Visit: Payer: Self-pay | Admitting: *Deleted

## 2015-06-07 ENCOUNTER — Ambulatory Visit (HOSPITAL_BASED_OUTPATIENT_CLINIC_OR_DEPARTMENT_OTHER): Payer: Medicare Other

## 2015-06-07 ENCOUNTER — Other Ambulatory Visit (HOSPITAL_BASED_OUTPATIENT_CLINIC_OR_DEPARTMENT_OTHER): Payer: Medicare Other

## 2015-06-07 VITALS — BP 131/79 | HR 94 | Temp 98.0°F | Resp 20

## 2015-06-07 DIAGNOSIS — Z5111 Encounter for antineoplastic chemotherapy: Secondary | ICD-10-CM

## 2015-06-07 DIAGNOSIS — C50412 Malignant neoplasm of upper-outer quadrant of left female breast: Secondary | ICD-10-CM

## 2015-06-07 LAB — COMPREHENSIVE METABOLIC PANEL
ALBUMIN: 3.7 g/dL (ref 3.5–5.0)
ALK PHOS: 58 U/L (ref 40–150)
AST: 11 U/L (ref 5–34)
Anion Gap: 9 mEq/L (ref 3–11)
BUN: 12.8 mg/dL (ref 7.0–26.0)
CALCIUM: 9.3 mg/dL (ref 8.4–10.4)
CO2: 24 mEq/L (ref 22–29)
CREATININE: 0.8 mg/dL (ref 0.6–1.1)
Chloride: 110 mEq/L — ABNORMAL HIGH (ref 98–109)
EGFR: >90 mL/min/{1.73_m2} (ref >90)
GLUCOSE: 107 mg/dL (ref 70–140)
POTASSIUM: 3.8 meq/L (ref 3.5–5.1)
SODIUM: 143 meq/L (ref 136–145)
TOTAL PROTEIN: 6.8 g/dL (ref 6.4–8.3)

## 2015-06-07 LAB — CBC WITH DIFFERENTIAL/PLATELET
BASO%: 0.5 % (ref 0.0–2.0)
Basophils Absolute: 0 10*3/uL (ref 0.0–0.1)
EOS%: 1.7 % (ref 0.0–7.0)
Eosinophils Absolute: 0.1 10*3/uL (ref 0.0–0.5)
HEMATOCRIT: 34.9 % (ref 34.8–46.6)
HEMOGLOBIN: 11.4 g/dL — AB (ref 11.6–15.9)
LYMPH#: 1.2 10*3/uL (ref 0.9–3.3)
LYMPH%: 27.4 % (ref 14.0–49.7)
MCH: 33.9 pg (ref 25.1–34.0)
MCHC: 32.5 g/dL (ref 31.5–36.0)
MCV: 104.3 fL — ABNORMAL HIGH (ref 79.5–101.0)
MONO#: 0.5 10*3/uL (ref 0.1–0.9)
MONO%: 11.7 % (ref 0.0–14.0)
NEUT%: 58.7 % (ref 38.4–76.8)
NEUTROS ABS: 2.7 10*3/uL (ref 1.5–6.5)
Platelets: 231 10*3/uL (ref 145–400)
RBC: 3.34 10*6/uL — ABNORMAL LOW (ref 3.70–5.45)
RDW: 16.4 % — AB (ref 11.2–14.5)
WBC: 4.5 10*3/uL (ref 3.9–10.3)

## 2015-06-07 MED ORDER — LIDOCAINE-PRILOCAINE 2.5-2.5 % EX CREA: 1.0000 "application " | TOPICAL_CREAM | CUTANEOUS | Status: DC | PRN

## 2015-06-07 MED ORDER — PALONOSETRON HCL INJECTION 0.25 MG/5ML
INTRAVENOUS | Status: AC
  Filled 2015-06-07: qty 5

## 2015-06-07 MED ORDER — SODIUM CHLORIDE 0.9 % IV SOLN
Freq: Once | INTRAVENOUS | Status: AC
  Administered 2015-06-07: 14:00:00 via INTRAVENOUS
  Filled 2015-06-07: qty 1

## 2015-06-07 MED ORDER — SODIUM CHLORIDE 0.9 % IJ SOLN
10.0000 mL | INTRAMUSCULAR | Status: DC | PRN
  Administered 2015-06-07: 10 mL
  Filled 2015-06-07: qty 10

## 2015-06-07 MED ORDER — SODIUM CHLORIDE 0.9 % IV SOLN
60.0000 mg/m2 | Freq: Once | INTRAVENOUS | Status: AC
  Administered 2015-06-07: 126 mg via INTRAVENOUS
  Filled 2015-06-07: qty 21

## 2015-06-07 MED ORDER — SODIUM CHLORIDE 0.9 % IV SOLN
Freq: Once | INTRAVENOUS | Status: AC
  Administered 2015-06-07: 13:00:00 via INTRAVENOUS

## 2015-06-07 MED ORDER — OXYCODONE-ACETAMINOPHEN 5-325 MG PO TABS: 1.0000 | ORAL_TABLET | Freq: Three times a day (TID) | ORAL | Status: DC | PRN

## 2015-06-07 MED ORDER — PALONOSETRON HCL INJECTION 0.25 MG/5ML
0.2500 mg | Freq: Once | INTRAVENOUS | Status: AC
  Administered 2015-06-07: 0.25 mg via INTRAVENOUS

## 2015-06-07 MED ORDER — FAMOTIDINE IN NACL 20-0.9 MG/50ML-% IV SOLN
20.0000 mg | Freq: Once | INTRAVENOUS | Status: AC
  Administered 2015-06-07: 20 mg via INTRAVENOUS

## 2015-06-07 MED ORDER — HEPARIN SOD (PORK) LOCK FLUSH 100 UNIT/ML IV SOLN
500.0000 [IU] | Freq: Once | INTRAVENOUS | Status: AC | PRN
  Administered 2015-06-07: 500 [IU]
  Filled 2015-06-07: qty 5

## 2015-06-07 MED ORDER — FAMOTIDINE IN NACL 20-0.9 MG/50ML-% IV SOLN
INTRAVENOUS | Status: AC
  Filled 2015-06-07: qty 50

## 2015-06-07 NOTE — Patient Instructions (Signed)
[facility] Discharge Instructions for Patients Receiving Chemotherapy  Today you received the following chemotherapy agents:  Taxol  To help prevent nausea and vomiting after your treatment, we encourage you to take your nausea medication as prescribed.   If you develop nausea and vomiting that is not controlled by your nausea medication, call the clinic.   BELOW ARE SYMPTOMS THAT SHOULD BE REPORTED IMMEDIATELY:  *FEVER GREATER THAN 100.5 F  *CHILLS WITH OR WITHOUT FEVER  NAUSEA AND VOMITING THAT IS NOT CONTROLLED WITH YOUR NAUSEA MEDICATION  *UNUSUAL SHORTNESS OF BREATH  *UNUSUAL BRUISING OR BLEEDING  TENDERNESS IN MOUTH AND THROAT WITH OR WITHOUT PRESENCE OF ULCERS  *URINARY PROBLEMS  *BOWEL PROBLEMS  UNUSUAL RASH Items with * indicate a potential emergency and should be followed up as soon as possible.  Feel free to call the clinic you have any questions or concerns. The clinic phone number is [phone].  Please show the CHEMO ALERT CARD at check-in to the Emergency Department and triage nurse.   

## 2015-06-13 NOTE — Assessment & Plan Note (Signed)
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  Treatment Plan: 1. Neoadjuvant chemotherapy with dose dense Adriamycin and Cytoxan 4 followed by Taxol and carboplatin 12 weekly 2. Followed by Mastectomy 3. Followed by Adjuvant XRT Patient wlll need Genetic counseling Ct scans and bone scans: do not show evidence of metastatic disease ------------------------------------------------------------------------------------------------------------------------------------------------------ Current treatment: completed 4 cycles of dose dense Adriamycin and Cytoxan today is cycle 11/12 Taxol (carboplatin was given for the first 4 cycles) Chemo Toxicities: 1. Nausea 2-3 days after chemotherapy I encouraged her to take the prescription for nausea medication that she currently has been. I will replace IV Zofran with IV Aloxi 2. Depression: With the loss of hair she has been very tearful. She is currently on Paxil and also take Xanax. 3. Alopecia 4. Bone pain related to Neulasta: Currently on Claritin 5. Severe fatigue due to chemotherapy 6. Chemotherapy-induced anemia monitoring very closely today's hemoglobin is 8.4. 7. Thrombocytopenia: Delayed cycle 5. discontinuing carboplatin from cycle 5 onwards  8. 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 Complains of back pain, renewed her pain medication today. I encouraged her to take Naprosyn with the hope to slowly taper off and discontinue  the narcotics.  I will arrange for post neoadjuvant breast MRI and follow-up after that with surgery and myself RTC after breast MRI to discuss the result

## 2015-06-14 ENCOUNTER — Encounter: Payer: Self-pay | Admitting: Hematology and Oncology

## 2015-06-14 ENCOUNTER — Ambulatory Visit (HOSPITAL_BASED_OUTPATIENT_CLINIC_OR_DEPARTMENT_OTHER): Payer: Medicare Other

## 2015-06-14 ENCOUNTER — Ambulatory Visit (HOSPITAL_BASED_OUTPATIENT_CLINIC_OR_DEPARTMENT_OTHER): Payer: Medicare Other | Admitting: Hematology and Oncology

## 2015-06-14 ENCOUNTER — Encounter: Payer: Self-pay | Admitting: *Deleted

## 2015-06-14 ENCOUNTER — Other Ambulatory Visit (HOSPITAL_BASED_OUTPATIENT_CLINIC_OR_DEPARTMENT_OTHER): Payer: Medicare Other

## 2015-06-14 VITALS — BP 153/88 | HR 89 | Temp 98.8°F | Resp 16

## 2015-06-14 VITALS — BP 150/90 | HR 103 | Temp 98.7°F | Resp 19 | Ht 67.5 in | Wt 196.7 lb

## 2015-06-14 DIAGNOSIS — G622 Polyneuropathy due to other toxic agents: Secondary | ICD-10-CM | POA: Diagnosis not present

## 2015-06-14 DIAGNOSIS — D6481 Anemia due to antineoplastic chemotherapy: Secondary | ICD-10-CM

## 2015-06-14 DIAGNOSIS — R530 Neoplastic (malignant) related fatigue: Secondary | ICD-10-CM | POA: Diagnosis not present

## 2015-06-14 DIAGNOSIS — C50412 Malignant neoplasm of upper-outer quadrant of left female breast: Secondary | ICD-10-CM

## 2015-06-14 DIAGNOSIS — G893 Neoplasm related pain (acute) (chronic): Secondary | ICD-10-CM

## 2015-06-14 DIAGNOSIS — Z5111 Encounter for antineoplastic chemotherapy: Secondary | ICD-10-CM | POA: Diagnosis present

## 2015-06-14 DIAGNOSIS — M545 Low back pain: Secondary | ICD-10-CM | POA: Diagnosis not present

## 2015-06-14 LAB — CBC WITH DIFFERENTIAL/PLATELET
BASO%: 0.3 % (ref 0.0–2.0)
BASOS ABS: 0 10*3/uL (ref 0.0–0.1)
EOS%: 1.8 % (ref 0.0–7.0)
Eosinophils Absolute: 0.1 10*3/uL (ref 0.0–0.5)
HEMATOCRIT: 34.4 % — AB (ref 34.8–46.6)
HGB: 11.2 g/dL — ABNORMAL LOW (ref 11.6–15.9)
LYMPH#: 1 10*3/uL (ref 0.9–3.3)
LYMPH%: 27.3 % (ref 14.0–49.7)
MCH: 33.5 pg (ref 25.1–34.0)
MCHC: 32.6 g/dL (ref 31.5–36.0)
MCV: 103 fL — ABNORMAL HIGH (ref 79.5–101.0)
MONO#: 0.3 10*3/uL (ref 0.1–0.9)
MONO%: 8.4 % (ref 0.0–14.0)
NEUT#: 2.4 10*3/uL (ref 1.5–6.5)
NEUT%: 62.2 % (ref 38.4–76.8)
PLATELETS: 191 10*3/uL (ref 145–400)
RBC: 3.34 10*6/uL — AB (ref 3.70–5.45)
RDW: 14.5 % (ref 11.2–14.5)
WBC: 3.8 10*3/uL — ABNORMAL LOW (ref 3.9–10.3)

## 2015-06-14 LAB — COMPREHENSIVE METABOLIC PANEL
ALT: 9 U/L (ref 0–55)
ANION GAP: 8 meq/L (ref 3–11)
AST: 10 U/L (ref 5–34)
Albumin: 3.6 g/dL (ref 3.5–5.0)
Alkaline Phosphatase: 62 U/L (ref 40–150)
BUN: 11.3 mg/dL (ref 7.0–26.0)
CALCIUM: 9.3 mg/dL (ref 8.4–10.4)
CHLORIDE: 108 meq/L (ref 98–109)
CO2: 26 meq/L (ref 22–29)
Creatinine: 0.8 mg/dL (ref 0.6–1.1)
Glucose: 100 mg/dl (ref 70–140)
POTASSIUM: 4 meq/L (ref 3.5–5.1)
Sodium: 141 mEq/L (ref 136–145)
Total Bilirubin: <0.3 mg/dL (ref 0.20–1.20)
Total Protein: 6.5 g/dL (ref 6.4–8.3)

## 2015-06-14 MED ORDER — PALONOSETRON HCL INJECTION 0.25 MG/5ML
INTRAVENOUS | Status: AC
  Filled 2015-06-14: qty 5

## 2015-06-14 MED ORDER — SODIUM CHLORIDE 0.9 % IV SOLN
45.0000 mg/m2 | Freq: Once | INTRAVENOUS | Status: AC
  Administered 2015-06-14: 96 mg via INTRAVENOUS
  Filled 2015-06-14: qty 16

## 2015-06-14 MED ORDER — FAMOTIDINE IN NACL 20-0.9 MG/50ML-% IV SOLN
INTRAVENOUS | Status: AC
  Filled 2015-06-14: qty 50

## 2015-06-14 MED ORDER — DEXAMETHASONE SODIUM PHOSPHATE 100 MG/10ML IJ SOLN
Freq: Once | INTRAMUSCULAR | Status: AC
  Administered 2015-06-14: 13:00:00 via INTRAVENOUS
  Filled 2015-06-14: qty 1

## 2015-06-14 MED ORDER — FAMOTIDINE IN NACL 20-0.9 MG/50ML-% IV SOLN
20.0000 mg | Freq: Once | INTRAVENOUS | Status: AC
  Administered 2015-06-14: 20 mg via INTRAVENOUS

## 2015-06-14 MED ORDER — PALONOSETRON HCL INJECTION 0.25 MG/5ML
0.2500 mg | Freq: Once | INTRAVENOUS | Status: AC
  Administered 2015-06-14: 0.25 mg via INTRAVENOUS

## 2015-06-14 MED ORDER — SODIUM CHLORIDE 0.9 % IV SOLN
Freq: Once | INTRAVENOUS | Status: AC
  Administered 2015-06-14: 13:00:00 via INTRAVENOUS

## 2015-06-14 MED ORDER — HEPARIN SOD (PORK) LOCK FLUSH 100 UNIT/ML IV SOLN
500.0000 [IU] | Freq: Once | INTRAVENOUS | Status: AC | PRN
  Administered 2015-06-14: 500 [IU]
  Filled 2015-06-14: qty 5

## 2015-06-14 MED ORDER — SODIUM CHLORIDE 0.9 % IJ SOLN
10.0000 mL | INTRAMUSCULAR | Status: DC | PRN
  Administered 2015-06-14: 10 mL
  Filled 2015-06-14: qty 10

## 2015-06-14 NOTE — Progress Notes (Signed)
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 She had tolerated chemotherapy fairly well. She has mild peripheral neuropathy. Denies any nausea or vomiting. She does have fatigue related to chemotherapy. She will finish her last cycle of chemotherapy next week. She will undergo breast MRI for reevaluation and follow-up.  REVIEW OF SYSTEMS:   Constitutional: Denies fevers, chills or abnormal weight loss, fatigue Eyes: Denies blurriness of vision Ears, nose, mouth, throat, and face: Denies mucositis or sore throat Respiratory: Denies cough, dyspnea or wheezes Cardiovascular: Denies palpitation, chest discomfort Gastrointestinal:  Denies nausea, heartburn or change in bowel habits Skin: Denies abnormal skin rashes Lymphatics: Denies new lymphadenopathy or easy bruising Neurological: Neuropathy grade 1 Behavioral/Psych: Mood is stable, no new changes  Extremities: No lower extremity edema  All other systems were reviewed with the patient and are negative.  I have reviewed the past medical history, past surgical history,  social history and family history with the patient and they are unchanged from previous note.  ALLERGIES:  has No Known Allergies.  MEDICATIONS:  Current Outpatient Prescriptions  Medication Sig Dispense Refill  . albuterol (PROVENTIL) (2.5 MG/3ML) 0.083% nebulizer solution Take 3 mLs (2.5 mg total) by nebulization every 4 (four) hours as needed for wheezing or shortness of breath. (Patient not taking: Reported on 03/22/2015) 75 mL 12  . ALPRAZolam (XANAX) 0.5 MG tablet Take 1 tablet (0.5 mg total) by mouth 3 (three) times daily as needed for anxiety or sleep. 60 tablet 0  . amLODipine (NORVASC) 5 MG tablet Take 5 mg by mouth 2 (two) times daily.    [name] ibuprofen (ADVIL,MOTRIN) 200 MG tablet Take 200 mg by mouth every 6 (six) hours as needed. Reported on 03/22/2015    . lidocaine-prilocaine (EMLA) cream Apply 1 application topically as needed. 30 g 0  . LORazepam (ATIVAN) 0.5 MG tablet Take 1 tablet (0.5 mg total) by mouth at bedtime. 30 tablet 0  . magic mouthwash w/lidocaine SOLN Take 5 mLs by mouth 2 (two) times daily as needed for mouth pain.    . metoprolol (LOPRESSOR) 50 MG tablet Take 0.5 tablets (25 mg total) by mouth 2 (two) times daily. (Patient taking differently: Take 50 mg by mouth 2 (two) times daily. )    . ondansetron (ZOFRAN) 8 MG tablet TAKE 1 TABLET TWICE DAILY AS NEEDED STARTING 3RD DAY AFTER CHEMOTHERAPY 20 tablet 1  . oxyCODONE-acetaminophen (PERCOCET/ROXICET) 5-325 MG tablet Take 1 tablet by mouth every 8 (eight) hours as needed for severe pain. 60 tablet 0  . PARoxetine (PAXIL) 20 MG tablet Take 20 mg by mouth 2 (two) times daily.     . prochlorperazine (COMPAZINE) 10  MG tablet TAKE 1 TABLET (10 MG TOTAL) BY MOUTH EVERY 6 (SIX) HOURS AS NEEDED (NAUSEA OR VOMITING). 30 tablet 1   No current facility-administered medications for this visit.    PHYSICAL EXAMINATION: ECOG PERFORMANCE STATUS: 1 - Symptomatic but completely ambulatory  Filed Vitals:   06/14/15 1232  BP:  150/90  Pulse: 103  Temp: 98.7 F (37.1 C)  Resp: 19   Filed Weights   06/14/15 1232  Weight: 196 lb 11.2 oz (89.223 kg)    GENERAL:alert, no distress and comfortable SKIN: skin color, texture, turgor are normal, no rashes or significant lesions EYES: normal, Conjunctiva are pink and non-injected, sclera clear OROPHARYNX:no exudate, no erythema and lips, buccal mucosa, and tongue normal  NECK: supple, thyroid normal size, non-tender, without nodularity LYMPH:  no palpable lymphadenopathy in the cervical, axillary or inguinal LUNGS: clear to auscultation and percussion with normal breathing effort HEART: regular rate & rhythm and no murmurs and no lower extremity edema ABDOMEN:abdomen soft, non-tender and normal bowel sounds MUSCULOSKELETAL:no cyanosis of digits and no clubbing  NEURO: alert & oriented x 3 with fluent speech, no focal motor/sensory deficits EXTREMITIES: No lower extremity edema  LABORATORY DATA:  I have reviewed the data as listed   Chemistry      Component Value Date/Time   NA 143 06/07/2015 1211   NA 143 01/17/2015 1228   K 3.8 06/07/2015 1211   K 3.9 01/17/2015 1228   CL 106 01/17/2015 1228   CO2 24 06/07/2015 1211   CO2 26 01/17/2015 1228   BUN 12.8 06/07/2015 1211   BUN 15 01/17/2015 1228   CREATININE 0.8 06/07/2015 1211   CREATININE 0.78 01/17/2015 1228      Component Value Date/Time   CALCIUM 9.3 06/07/2015 1211   CALCIUM 9.5 01/17/2015 1228   ALKPHOS 58 06/07/2015 1211   ALKPHOS 55 10/29/2007 2156   AST 11 06/07/2015 1211   AST 18 10/29/2007 2156   ALT <9 06/07/2015 1211   ALT 12 10/29/2007 2156   BILITOT <0.30 06/07/2015 1211   BILITOT 0.5 10/29/2007 2156       Lab Results  Component Value Date   WBC 3.8* 06/14/2015   HGB 11.2* 06/14/2015   HCT 34.4* 06/14/2015   MCV 103.0* 06/14/2015   PLT 191 06/14/2015   NEUTROABS 2.4 06/14/2015     ASSESSMENT & PLAN:  Breast cancer of upper-outer quadrant of left female breast  ([place]) 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  Treatment Plan: 1. Neoadjuvant chemotherapy with dose dense Adriamycin and Cytoxan 4 followed by Taxol and carboplatin 12 weekly 2. Followed by Mastectomy 3. Followed by Adjuvant XRT Patient wlll need Genetic counseling Ct scans and bone scans: do not show evidence of metastatic disease ------------------------------------------------------------------------------------------------------------------------------------------------------ Current treatment: completed 4 cycles of dose dense Adriamycin and Cytoxan today is cycle 11/12 Taxol (carboplatin was given for the first 4 cycles) Chemo Toxicities: 1. Nausea 2-3 days after chemotherapy I encouraged her to take the prescription for nausea medication that she currently has been. I will replace IV Zofran with IV Aloxi 2. Depression: With the loss of hair she has been very tearful. She is currently on Paxil and also take Xanax. 3. Alopecia  4. Bone pain related to Neulasta: Currently on Claritin 5. Severe fatigue due to chemotherapy 6. Chemotherapy-induced anemia monitoring very closely today's hemoglobin is 8.4. 7. Thrombocytopenia: Delayed cycle 5. discontinuing carboplatin from cycle 5 onwards  8. 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 Complains of back pain, renewed her pain medication today. I encouraged her to take Naprosyn with the hope to slowly taper off and  discontinue the narcotics.  I will arrange for post neoadjuvant breast MRI and follow-up after that with surgery and myself RTC after breast MRI to discuss the result   No orders of the defined types were placed in this encounter.   The patient has a good understanding of the overall plan. she agrees with it. she will call with any problems that may develop before the next visit here.   Rulon Eisenmenger, MD 06/14/2015

## 2015-06-19 ENCOUNTER — Encounter: Payer: Self-pay | Admitting: Hematology and Oncology

## 2015-06-19 NOTE — Progress Notes (Signed)
Pt called needing assistance w/ personal bills.  I informed her of the [employer].  She would like to apply so she's going to bring her proof of income on 06/21/15 to see if she qualifies.

## 2015-06-21 ENCOUNTER — Encounter: Payer: Self-pay | Admitting: Hematology and Oncology

## 2015-06-21 ENCOUNTER — Encounter: Payer: Self-pay | Admitting: *Deleted

## 2015-06-21 ENCOUNTER — Other Ambulatory Visit (HOSPITAL_BASED_OUTPATIENT_CLINIC_OR_DEPARTMENT_OTHER): Payer: Medicare Other

## 2015-06-21 ENCOUNTER — Ambulatory Visit (HOSPITAL_BASED_OUTPATIENT_CLINIC_OR_DEPARTMENT_OTHER): Payer: Medicare Other

## 2015-06-21 VITALS — BP 146/89 | HR 94 | Temp 98.4°F | Resp 18

## 2015-06-21 DIAGNOSIS — Z452 Encounter for adjustment and management of vascular access device: Secondary | ICD-10-CM

## 2015-06-21 DIAGNOSIS — Z5111 Encounter for antineoplastic chemotherapy: Secondary | ICD-10-CM

## 2015-06-21 DIAGNOSIS — C50412 Malignant neoplasm of upper-outer quadrant of left female breast: Secondary | ICD-10-CM

## 2015-06-21 LAB — COMPREHENSIVE METABOLIC PANEL
ALT: 10 U/L (ref 0–55)
AST: 13 U/L (ref 5–34)
Albumin: 3.7 g/dL (ref 3.5–5.0)
Alkaline Phosphatase: 56 U/L (ref 40–150)
Anion Gap: 6 mEq/L (ref 3–11)
BUN: 13.2 mg/dL (ref 7.0–26.0)
CO2: 27 meq/L (ref 22–29)
CREATININE: 0.8 mg/dL (ref 0.6–1.1)
Calcium: 9.2 mg/dL (ref 8.4–10.4)
Chloride: 107 mEq/L (ref 98–109)
GLUCOSE: 95 mg/dL (ref 70–140)
Potassium: 4.2 mEq/L (ref 3.5–5.1)
SODIUM: 140 meq/L (ref 136–145)
TOTAL PROTEIN: 6.6 g/dL (ref 6.4–8.3)

## 2015-06-21 LAB — CBC WITH DIFFERENTIAL/PLATELET
BASO%: 0.5 % (ref 0.0–2.0)
Basophils Absolute: 0 10*3/uL (ref 0.0–0.1)
EOS%: 2.4 % (ref 0.0–7.0)
Eosinophils Absolute: 0.1 10*3/uL (ref 0.0–0.5)
HCT: 34.2 % — ABNORMAL LOW (ref 34.8–46.6)
HGB: 11.1 g/dL — ABNORMAL LOW (ref 11.6–15.9)
LYMPH%: 25.9 % (ref 14.0–49.7)
MCH: 33 pg (ref 25.1–34.0)
MCHC: 32.5 g/dL (ref 31.5–36.0)
MCV: 101.3 fL — ABNORMAL HIGH (ref 79.5–101.0)
MONO#: 0.4 10*3/uL (ref 0.1–0.9)
MONO%: 9.8 % (ref 0.0–14.0)
NEUT%: 61.4 % (ref 38.4–76.8)
NEUTROS ABS: 2.2 10*3/uL (ref 1.5–6.5)
Platelets: 223 10*3/uL (ref 145–400)
RBC: 3.38 10*6/uL — AB (ref 3.70–5.45)
RDW: 16.1 % — ABNORMAL HIGH (ref 11.2–14.5)
WBC: 3.6 10*3/uL — AB (ref 3.9–10.3)
lymph#: 0.9 10*3/uL (ref 0.9–3.3)

## 2015-06-21 MED ORDER — SODIUM CHLORIDE 0.9 % IV SOLN
Freq: Once | INTRAVENOUS | Status: AC
  Administered 2015-06-21: 14:00:00 via INTRAVENOUS
  Filled 2015-06-21: qty 1

## 2015-06-21 MED ORDER — HEPARIN SOD (PORK) LOCK FLUSH 100 UNIT/ML IV SOLN
500.0000 [IU] | Freq: Once | INTRAVENOUS | Status: AC | PRN
  Administered 2015-06-21: 500 [IU]
  Filled 2015-06-21: qty 5

## 2015-06-21 MED ORDER — SODIUM CHLORIDE 0.9 % IV SOLN
45.0000 mg/m2 | Freq: Once | INTRAVENOUS | Status: AC
  Administered 2015-06-21: 96 mg via INTRAVENOUS
  Filled 2015-06-21: qty 16

## 2015-06-21 MED ORDER — SODIUM CHLORIDE 0.9 % IJ SOLN
10.0000 mL | INTRAMUSCULAR | Status: DC | PRN
  Administered 2015-06-21: 10 mL
  Filled 2015-06-21: qty 10

## 2015-06-21 MED ORDER — FAMOTIDINE IN NACL 20-0.9 MG/50ML-% IV SOLN
INTRAVENOUS | Status: AC
  Filled 2015-06-21: qty 50

## 2015-06-21 MED ORDER — ALTEPLASE 2 MG IJ SOLR
2.0000 mg | Freq: Once | INTRAMUSCULAR | Status: AC | PRN
  Administered 2015-06-21: 2 mg
  Filled 2015-06-21: qty 2

## 2015-06-21 MED ORDER — SODIUM CHLORIDE 0.9 % IV SOLN
Freq: Once | INTRAVENOUS | Status: AC
  Administered 2015-06-21: 13:00:00 via INTRAVENOUS

## 2015-06-21 MED ORDER — FAMOTIDINE IN NACL 20-0.9 MG/50ML-% IV SOLN
20.0000 mg | Freq: Once | INTRAVENOUS | Status: AC
  Administered 2015-06-21: 20 mg via INTRAVENOUS

## 2015-06-21 MED ORDER — PALONOSETRON HCL INJECTION 0.25 MG/5ML
0.2500 mg | Freq: Once | INTRAVENOUS | Status: AC
  Administered 2015-06-21: 0.25 mg via INTRAVENOUS

## 2015-06-21 MED ORDER — PALONOSETRON HCL INJECTION 0.25 MG/5ML
INTRAVENOUS | Status: AC
  Filled 2015-06-21: qty 5

## 2015-06-21 NOTE — Patient Instructions (Signed)
[facility] Discharge Instructions for Patients Receiving Chemotherapy  Today you received the following chemotherapy agents:  Taxol  To help prevent nausea and vomiting after your treatment, we encourage you to take your nausea medication as prescribed.   If you develop nausea and vomiting that is not controlled by your nausea medication, call the clinic.   BELOW ARE SYMPTOMS THAT SHOULD BE REPORTED IMMEDIATELY:  *FEVER GREATER THAN 100.5 F  *CHILLS WITH OR WITHOUT FEVER  NAUSEA AND VOMITING THAT IS NOT CONTROLLED WITH YOUR NAUSEA MEDICATION  *UNUSUAL SHORTNESS OF BREATH  *UNUSUAL BRUISING OR BLEEDING  TENDERNESS IN MOUTH AND THROAT WITH OR WITHOUT PRESENCE OF ULCERS  *URINARY PROBLEMS  *BOWEL PROBLEMS  UNUSUAL RASH Items with * indicate a potential emergency and should be followed up as soon as possible.  Feel free to call the clinic you have any questions or concerns. The clinic phone number is [phone].  Please show the CHEMO ALERT CARD at check-in to the Emergency Department and triage nurse.   

## 2015-06-21 NOTE — Progress Notes (Signed)
Pt port flushing well but no blood return noted. Attempted to work on port for 15 minutes without blood return success. No difficulty pushing saline through port. Infused port line with Cath flo at 1245. Will recheck for blood return at 1215.   1215- Great blood return noted from [facility] 30 min after cath flo administered. Wasted 10cc of blood. Will release pt treatment orders for today.

## 2015-06-21 NOTE — Progress Notes (Signed)
Pt is approved for the $1000 [employer] grant.  

## 2015-06-24 ENCOUNTER — Telehealth: Payer: Self-pay | Admitting: Hematology and Oncology

## 2015-06-24 ENCOUNTER — Inpatient Hospital Stay: Admission: RE | Admit: 2015-06-24 | Payer: Medicare Other | Source: Ambulatory Visit

## 2015-06-24 ENCOUNTER — Encounter: Payer: Self-pay | Admitting: *Deleted

## 2015-06-24 NOTE — Telephone Encounter (Signed)
lvm to inform patient of r/s 6/21 appt to 7/5 at 215 pm

## 2015-06-26 ENCOUNTER — Ambulatory Visit: Payer: Medicare Other | Admitting: Hematology and Oncology

## 2015-07-03 ENCOUNTER — Ambulatory Visit
Admission: RE | Admit: 2015-07-03 | Discharge: 2015-07-03 | Disposition: A | Discharge status: Home / Self Care | Payer: Medicare Other | Source: Ambulatory Visit | Attending: Hematology and Oncology | Admitting: Hematology and Oncology

## 2015-07-03 DIAGNOSIS — C50412 Malignant neoplasm of upper-outer quadrant of left female breast: Secondary | ICD-10-CM

## 2015-07-03 MED ORDER — GADOBENATE DIMEGLUMINE 529 MG/ML IV SOLN
18.0000 mL | Freq: Once | INTRAVENOUS | Status: AC | PRN
  Administered 2015-07-03: 18 mL via INTRAVENOUS

## 2015-07-09 NOTE — Assessment & Plan Note (Signed)
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  Treatment Plan: 1. Neoadjuvant chemotherapy with dose dense Adriamycin and Cytoxan 4 followed by Taxol and carboplatin 12 weekly 2. Followed by Mastectomy 3. Followed by Adjuvant XRT Patient wlll need Genetic counseling Ct scans and bone scans: do not show evidence of metastatic disease ------------------------------------------------------------------------------------------------------------------------------------------------------ Treatment: completed 4 cycles of dose dense Adriamycin and Cytoxan foll by 12 cycles of Taxol (carboplatin was given for the first 4 cycles)  Breast MRI 07/03/15: Significant improvement with largest confluent area measures 3.4 cm. Other susp nodules ant to the mass. Overall enhancement improved, Significant improvement in left axillary and IM LN  Discussed the MRI findings with patient. She was presented in [place] She will need mastectomy foll by XRT

## 2015-07-10 ENCOUNTER — Encounter: Payer: Self-pay | Admitting: Hematology and Oncology

## 2015-07-10 ENCOUNTER — Ambulatory Visit (HOSPITAL_BASED_OUTPATIENT_CLINIC_OR_DEPARTMENT_OTHER): Payer: Medicare Other | Admitting: Hematology and Oncology

## 2015-07-10 VITALS — BP 170/98 | HR 101 | Temp 97.9°F | Resp 18 | Ht 67.5 in | Wt 194.6 lb

## 2015-07-10 DIAGNOSIS — C50412 Malignant neoplasm of upper-outer quadrant of left female breast: Secondary | ICD-10-CM

## 2015-07-10 DIAGNOSIS — Z171 Estrogen receptor negative status [ER-]: Secondary | ICD-10-CM | POA: Diagnosis not present

## 2015-07-10 MED ORDER — OXYCODONE-ACETAMINOPHEN 5-325 MG PO TABS: 1.0000 | ORAL_TABLET | Freq: Three times a day (TID) | ORAL | Status: DC | PRN

## 2015-07-10 MED ORDER — ALPRAZOLAM 0.5 MG PO TABS: 0.5000 mg | ORAL_TABLET | Freq: Three times a day (TID) | ORAL | Status: DC | PRN

## 2015-07-10 NOTE — Progress Notes (Signed)
Patient Care Team: [name], MD as PCP - General (Internal Medicine) Nicholas Lose, MD as Consulting Physician (Hematology and Oncology) Alphonsa Overall, MD as Consulting Physician (General Surgery)  SUMMARY OF ONCOLOGIC HISTORY:   Breast cancer of upper-outer quadrant of left female breast (Deborah Mcintyre)   12/19/2014 Mammogram Left breast palpable mass at 2:00: 4 x 3.8 x 2.5 cm, enlarged left axillary lymph node 4 cm in size with diffuse skin thickening [place] 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   12/26/2014 Initial Diagnosis Left breast biopsy: Invasive ductal carcinoma with lymphovascular invasion, 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, grade 3, ER/PR negative, Ki-67 80% HER-2 Neg   01/18/2015 - 06/21/2015 Neo-Adjuvant Chemotherapy Dose dense Adriamycin and Cytoxan 4 followed by Taxol and carboplatin [id number]   07/03/2015 Breast MRI Improvement in multiple areas of enhancement largest area 3.4 cm other suspicious nodules anterior to the mass also improved, left axillary internal mammary lymph nodes improved    CHIEF COMPLIANT: Follow-up to discuss MRI results  INTERVAL HISTORY: 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 She complains of profound neuropathy. She denies any nausea or vomiting. Does complain of fatigue. She also has pain in the breast and severe anxiety.  REVIEW OF SYSTEMS:   Constitutional: Denies fevers, chills or abnormal weight loss Eyes: Denies blurriness of vision Ears, nose, mouth, throat, and face: Denies mucositis or sore throat Respiratory: Denies cough, dyspnea or wheezes Cardiovascular: Denies palpitation, chest discomfort Gastrointestinal:  Denies nausea, heartburn or change in bowel habits Skin: Denies abnormal skin rashes Lymphatics: Denies new lymphadenopathy or easy  bruising Neurological:Denies numbness, tingling or new weaknesses Behavioral/Psych: Mood is stable, no new changes  Extremities: No lower extremity edema Breast: Breast pain All other systems were reviewed with the patient and are negative.  I have reviewed the past medical history, past surgical history, social history and family history with the patient and they are unchanged from previous note.  ALLERGIES:  has No Known Allergies.  MEDICATIONS:  Current Outpatient Prescriptions  Medication Sig Dispense Refill  . albuterol (PROVENTIL) (2.5 MG/3ML) 0.083% nebulizer solution Take 3 mLs (2.5 mg total) by nebulization every 4 (four) hours as needed for wheezing or shortness of breath. (Patient not taking: Reported on 03/22/2015) 75 mL 12  . ALPRAZolam (XANAX) 0.5 MG tablet Take 1 tablet (0.5 mg total) by mouth 3 (three) times daily as needed for anxiety or sleep. 60 tablet 0  . amLODipine (NORVASC) 5 MG tablet Take 5 mg by mouth 2 (two) times daily.    [name] ibuprofen (ADVIL,MOTRIN) 200 MG tablet Take 200 mg by mouth every 6 (six) hours as needed. Reported on 03/22/2015    . lidocaine-prilocaine (EMLA) cream Apply 1 application topically as needed. 30 g 0  . LORazepam (ATIVAN) 0.5 MG tablet Take 1 tablet (0.5 mg total) by mouth at bedtime. 30 tablet 0  . magic mouthwash w/lidocaine SOLN Take 5 mLs by mouth 2 (two) times daily as needed for mouth pain.    . metoprolol (LOPRESSOR) 50 MG tablet Take 0.5 tablets (25 mg total) by mouth 2 (two) times daily. (Patient taking differently: Take 50 mg by mouth 2 (two) times daily. )    . ondansetron (ZOFRAN) 8 MG tablet TAKE 1 TABLET TWICE DAILY AS NEEDED STARTING 3RD DAY AFTER CHEMOTHERAPY 20 tablet 1  . oxyCODONE-acetaminophen (PERCOCET/ROXICET) 5-325 MG  tablet Take 1 tablet by mouth every 8 (eight) hours as needed for severe pain. 60 tablet 0  . PARoxetine (PAXIL) 20 MG tablet Take 20 mg by mouth 2 (two) times daily.     . prochlorperazine (COMPAZINE) 10 MG  tablet TAKE 1 TABLET (10 MG TOTAL) BY MOUTH EVERY 6 (SIX) HOURS AS NEEDED (NAUSEA OR VOMITING). 30 tablet 1   No current facility-administered medications for this visit.    PHYSICAL EXAMINATION: ECOG PERFORMANCE STATUS: 1 - Symptomatic but completely ambulatory  Filed Vitals:   07/10/15 1425  BP: 170/98  Pulse: 101  Temp: 97.9 F (36.6 C)  Resp: 18   Filed Weights   07/10/15 1425  Weight: 194 lb 9.6 oz (88.27 kg)    GENERAL:alert, no distress and comfortable SKIN: skin color, texture, turgor are normal, no rashes or significant lesions EYES: normal, Conjunctiva are pink and non-injected, sclera clear OROPHARYNX:no exudate, no erythema and lips, buccal mucosa, and tongue normal  NECK: supple, thyroid normal size, non-tender, without nodularity LYMPH:  no palpable lymphadenopathy in the cervical, axillary or inguinal LUNGS: clear to auscultation and percussion with normal breathing effort HEART: regular rate & rhythm and no murmurs and no lower extremity edema ABDOMEN:abdomen soft, non-tender and normal bowel sounds MUSCULOSKELETAL:no cyanosis of digits and no clubbing  NEURO: alert & oriented x 3 with fluent speech, no focal motor/sensory deficits EXTREMITIES: No lower extremity edema  LABORATORY DATA:  I have reviewed the data as listed   Chemistry      Component Value Date/Time   NA 140 06/21/2015 1142   NA 143 01/17/2015 1228   K 4.2 06/21/2015 1142   K 3.9 01/17/2015 1228   CL 106 01/17/2015 1228   CO2 27 06/21/2015 1142   CO2 26 01/17/2015 1228   BUN 13.2 06/21/2015 1142   BUN 15 01/17/2015 1228   CREATININE 0.8 06/21/2015 1142   CREATININE 0.78 01/17/2015 1228      Component Value Date/Time   CALCIUM 9.2 06/21/2015 1142   CALCIUM 9.5 01/17/2015 1228   ALKPHOS 56 06/21/2015 1142   ALKPHOS 55 10/29/2007 2156   AST 13 06/21/2015 1142   AST 18 10/29/2007 2156   ALT 10 06/21/2015 1142   ALT 12 10/29/2007 2156   BILITOT <0.30 06/21/2015 1142   BILITOT 0.5  10/29/2007 2156       Lab Results  Component Value Date   WBC 3.6* 06/21/2015   HGB 11.1* 06/21/2015   HCT 34.2* 06/21/2015   MCV 101.3* 06/21/2015   PLT 223 06/21/2015   NEUTROABS 2.2 06/21/2015     ASSESSMENT & PLAN:  Breast cancer of upper-outer quadrant of left female breast (Deborah Mcintyre) 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  Treatment Plan: 1. Neoadjuvant chemotherapy with dose dense Adriamycin and Cytoxan 4 followed by Taxol and carboplatin 12 weekly 2. Followed by Mastectomy 3. Followed by Adjuvant XRT Patient wlll need Genetic counseling Ct scans and bone scans: do not show evidence of metastatic disease ------------------------------------------------------------------------------------------------------------------------------------------------------ Treatment: completed 4 cycles of dose dense Adriamycin and Cytoxan foll by 12 cycles of Taxol (carboplatin was given for the first 4 cycles)  Breast MRI 07/03/15: Significant improvement with largest confluent area measures 3.4  cm. Other susp nodules ant to the mass. Overall enhancement improved, Significant improvement in left axillary and IM LN  Discussed the MRI findings with patient. She was presented in [place] She will need mastectomy foll by XRT She may need post surgery Xeloda versus Pembrolizumab clinical trial  No orders of the defined types were placed in this encounter.   The patient has a good understanding of the overall plan. she agrees with it. she will call with any problems that may develop before the next visit here.   Rulon Eisenmenger, MD 07/10/2015

## 2015-07-23 ENCOUNTER — Other Ambulatory Visit: Payer: Self-pay | Admitting: Surgery

## 2015-07-23 DIAGNOSIS — N6021 Fibroadenosis of right breast: Secondary | ICD-10-CM

## 2015-08-01 ENCOUNTER — Other Ambulatory Visit: Payer: Self-pay | Admitting: Surgery

## 2015-08-01 DIAGNOSIS — N6021 Fibroadenosis of right breast: Secondary | ICD-10-CM

## 2015-08-02 ENCOUNTER — Telehealth: Payer: Self-pay | Admitting: Hematology and Oncology

## 2015-08-02 ENCOUNTER — Encounter: Payer: Self-pay | Admitting: *Deleted

## 2015-08-02 NOTE — Telephone Encounter (Signed)
lvm to inform pt of 8/25 appt date/time per pof

## 2015-08-13 ENCOUNTER — Other Ambulatory Visit: Payer: Self-pay

## 2015-08-13 DIAGNOSIS — C50412 Malignant neoplasm of upper-outer quadrant of left female breast: Secondary | ICD-10-CM

## 2015-08-13 MED ORDER — ALPRAZOLAM 0.5 MG PO TABS: 0.5000 mg | ORAL_TABLET | Freq: Three times a day (TID) | ORAL | 0 refills | Status: DC | PRN

## 2015-08-13 MED ORDER — OXYCODONE-ACETAMINOPHEN 5-325 MG PO TABS: 1.0000 | ORAL_TABLET | Freq: Three times a day (TID) | ORAL | 0 refills | Status: DC | PRN

## 2015-08-20 ENCOUNTER — Encounter (HOSPITAL_COMMUNITY)
Admission: RE | Admit: 2015-08-20 | Discharge: 2015-08-20 | Disposition: A | Discharge status: Home / Self Care | Payer: Medicare Other | Source: Ambulatory Visit | Attending: Surgery | Admitting: Surgery

## 2015-08-20 ENCOUNTER — Encounter (HOSPITAL_COMMUNITY): Payer: Self-pay

## 2015-08-20 DIAGNOSIS — C50412 Malignant neoplasm of upper-outer quadrant of left female breast: Secondary | ICD-10-CM | POA: Diagnosis not present

## 2015-08-20 DIAGNOSIS — C50912 Malignant neoplasm of unspecified site of left female breast: Secondary | ICD-10-CM | POA: Diagnosis present

## 2015-08-20 DIAGNOSIS — F329 Major depressive disorder, single episode, unspecified: Secondary | ICD-10-CM | POA: Diagnosis not present

## 2015-08-20 DIAGNOSIS — I10 Essential (primary) hypertension: Secondary | ICD-10-CM | POA: Diagnosis not present

## 2015-08-20 DIAGNOSIS — F17200 Nicotine dependence, unspecified, uncomplicated: Secondary | ICD-10-CM | POA: Diagnosis not present

## 2015-08-20 DIAGNOSIS — J449 Chronic obstructive pulmonary disease, unspecified: Secondary | ICD-10-CM | POA: Diagnosis not present

## 2015-08-20 HISTORY — DX: Depression, unspecified: F32.A

## 2015-08-20 HISTORY — DX: Anxiety disorder, unspecified: F41.9

## 2015-08-20 HISTORY — DX: Other specified postprocedural states: Z98.890

## 2015-08-20 HISTORY — DX: Other specified postprocedural states: R11.2

## 2015-08-20 HISTORY — DX: Major depressive disorder, single episode, unspecified: F32.9

## 2015-08-20 HISTORY — DX: Nocturia: R35.1

## 2015-08-20 HISTORY — DX: Anesthesia of skin: R20.0

## 2015-08-20 HISTORY — DX: Paresthesia of skin: R20.2

## 2015-08-20 LAB — BASIC METABOLIC PANEL
Anion gap: 9 (ref 5–15)
BUN: 13 mg/dL (ref 6–20)
CO2: 24 mmol/L (ref 22–32)
CREATININE: 0.74 mg/dL (ref 0.44–1.00)
Calcium: 9.3 mg/dL (ref 8.9–10.3)
Chloride: 107 mmol/L (ref 101–111)
GFR calc Af Amer: >60 mL/min (ref >60)
GLUCOSE: 99 mg/dL (ref 65–99)
POTASSIUM: 3.7 mmol/L (ref 3.5–5.1)
Sodium: 140 mmol/L (ref 135–145)

## 2015-08-20 LAB — CBC
HCT: 41.2 % (ref 36.0–46.0)
Hemoglobin: 13.1 g/dL (ref 12.0–15.0)
MCH: 31.7 pg (ref 26.0–34.0)
MCHC: 31.8 g/dL (ref 30.0–36.0)
MCV: 99.8 fL (ref 78.0–100.0)
PLATELETS: 232 10*3/uL (ref 150–400)
RBC: 4.13 MIL/uL (ref 3.87–5.11)
RDW: 14.3 % (ref 11.5–15.5)
WBC: 4.3 10*3/uL (ref 4.0–10.5)

## 2015-08-20 NOTE — Progress Notes (Signed)
PCP - Dr. [name]  EKG - requested CXR - denies Echo - 01/08/15 Stress test - requested Cardiac Cath - denies  Patient denies chest pain and shortness of breath at PAT appointment.

## 2015-08-20 NOTE — Pre-Procedure Instructions (Signed)
Deborah Mcintyre  08/20/2015      [facility] [place], [place] - [address] [place] [address] Phone: [phone] Fax: [phone]  [id number] [place] [id number]: [phone]: [phone]   Your procedure is scheduled on Friday, August 18th, 2017.  [facility]   Call this number if you have problems the morning of surgery:  [phone]   Remember:  Do not eat food or drink liquids after midnight.   Take these medicines the morning of surgery with A SIP OF WATER: Alprazolam (Xanax) if needed, Amlodipine (Norvasc), Metoprolol (Lopressor), Oxycodone-acetaminophen (Percocet) if needed, Paroxetine (Paxil), eye drops as needed, Albuterol inhaler and Albuterol nebulizer if needed (please bring inhaler with you)  Stop taking: Aspirin, NSAIDS, Aleve, Naproxen, Ibuprofen, Advil, Motrin, BC's, Goody's, Fish oil, all herbal medications, and all vitamins.    Do not wear jewelry, make-up or nail polish.  Do not wear lotions, powders, or perfumes.  You may NOT wear deoderant.  Do not shave 48 hours prior to surgery.    Do not bring valuables to the hospital.  [facility] or [facility]ables.  Contacts, dentures or bridgework may not be worn into surgery.  Leave your suitcase in the car.  After surgery it may be brought to your room.  For patients admitted to the hospital, discharge time will be determined by your treatment team.  Patients discharged the day of surgery will not be allowed to drive home.   Special instructions:  Preparing for Surgery.   Please read over the following fact sheets that you were given.   - Preparing For Surgery  Before surgery, you can play an important role. Because skin is not sterile,  your skin needs to be as free of germs as possible. You can reduce the number of germs on your skin by washing with CHG (chlorahexidine gluconate) Soap before surgery.  CHG is an antiseptic cleaner which kills germs and bonds with the skin to continue killing germs even after washing.  Please do not use if you have an allergy to CHG or antibacterial soaps. If your skin becomes reddened/irritated stop using the CHG.  Do not shave (including legs and underarms) for at least 48 hours prior to first CHG shower. It is OK to shave your face.  Please follow these instructions carefully.   1. Shower the NIGHT BEFORE SURGERY and the MORNING OF SURGERY with CHG.   2. If you chose to wash your hair, wash your hair first as usual with your normal shampoo.  3. After you shampoo, rinse your hair and body thoroughly to remove the shampoo.  4. Use CHG as you would any other liquid soap. You can apply CHG directly to the skin and wash gently with a scrungie or a clean washcloth.   5. Apply the CHG Soap to your body ONLY FROM THE NECK DOWN.  Do not use on open wounds or open sores. Avoid contact with your eyes, ears, mouth and genitals (private parts). Wash genitals (private parts) with your normal soap.  6. Wash thoroughly, paying special attention to the area where your surgery will be performed.  7. Thoroughly rinse your body with warm water from the neck down.  8. DO NOT shower/wash with your normal soap  after using and rinsing off the CHG Soap.  9. Pat yourself dry with a CLEAN TOWEL.   10. Wear CLEAN PAJAMAS   11. Place CLEAN SHEETS on your bed the night of your first shower and DO NOT SLEEP WITH PETS.  Day of Surgery: Do not apply any deodorants/lotions. Please wear clean clothes to the hospital/surgery center.

## 2015-08-22 ENCOUNTER — Ambulatory Visit
Admission: RE | Admit: 2015-08-22 | Discharge: 2015-08-22 | Disposition: A | Discharge status: Home / Self Care | Payer: Medicare Other | Source: Ambulatory Visit | Attending: Surgery | Admitting: Surgery

## 2015-08-22 DIAGNOSIS — N6021 Fibroadenosis of right breast: Secondary | ICD-10-CM

## 2015-08-22 NOTE — Progress Notes (Signed)
Called Dr. Merrilee Jansky office and requested EKG,Stress test  And last office visit .  Second request for this information.

## 2015-08-22 NOTE — H&P (Signed)
Deborah Mcintyre. Deborah Mcintyre  Location: [facility] Patient #: [id number] DOB: 1955/08/08 Single / Language: [place] / Race: Black or African American Female  History of Present Illness   The patient is a 60 year old female who presents with breast cancer.   Her PCP is Dr. Thurnell Lose  She comes with Mr. Szopinski, though they are separated.   She has completed neo adjuvant chemotx by Dr. Pamelia Hoit. She had AC followed by Taxol weekly x 12. Her last treatment was about 2 weeks ago.  She had a breast MRI - 07/03/2015 - 1. Significant improvement in multiple areas of enhancement throughout the left breast, largest confluent area of enhancement now measures 3.4 cm. Other suspicious enhancing nodules are identified anterior to this mass. But overall, the enhancement anterior portion of the breasts has improved significantly. 2. Significant improvement left axillary and internal mammary adenopathy. (I gave her copies of her reports)   She will need a left mastectomy and left axillary node dissecton (MRM). She was surprised by this, which makes me think that she has some lack of insight into her disease. She is interested in possible reconstruction - but as a smoker, not sure how plastics will address this. I told her (as I have told her before), she needs to quit smoking.  Plan: 1) check with Dr. Pamelia Hoit about port being removed, 2) she has a right breast lesion, sclerosing lesion, that needs excision in addition to her left breast, 3) is she a candidate for the Alliance trial, 4) plastic surgery consult  History of breast cancer (12/2015): The patient noticed some changes in her left breast in December 2016. This prompted a mammogram at the breast center which showed a 4 cm irregular mass in the left breast at the 2 o'clock position. There were also enlarged left axillary lymph nodes worrisome for metastatic adenopathy. She underwent a left breast biopsy in 26 December 2014 ([id number]) which showed a invasive left breast cancer that is triple negative. The Ki67 is 80%. The left axillary lymph node biopsy was positive for metastatic cancer.  She had an MRI of her breast on 10 January 2015 which shows 1. 5.6 x 4.0 x 3.5 cm biopsy-proven invasive ductal carcinoma in the posterior aspect of the upper-outer quadrant of the left breast. 2. 2.3 x 2.1 x 2.1 cm irregular, enhancing mass with similar enhancement kinetics in the anterior aspect of the upper-outer quadrant of the left breast. 3. Multiple additional small, rounded, enhancing masses in the anterior 2/3 of the left breast extending from the nipple to the posterior aspect of the biopsied malignancy. This involves all 4 quadrants anteriorly and the upper outer and upper inner quadrants posteriorly. This area measures 11.8 x 10.5 x 5.6 cm and is compatible with additional multifocal malignancy. 4. Diffuse edema throughout the left breast with associated diffuse skin thickening. This is most likely due to a lymphatic obstruction by the malignancy in the breast. Dermal invasion cannot be excluded. 5. Extensive left axillary metastatic adenopathy, including retropectoral adenopathy. 6. Multiple mildly enlarged left internal mammary lymph nodes, suspicious for metastatic adenopathy. 7. 1.0 x 0.9 x 0.7 cm irregular enhancing mass in the central right breast with imaging features suspicious for malignancy. 8. 2.5 x 1.4 x 0.8 cm group of 3 small, rounded enhancing masses in the lower outer quadrant of the right breast, suspicious for malignancy.  She has no family history of breast cancer. she has used Premarin vaginal cream. She went through menopause around 2008.  Past Medical History: 1. COPD 2. SMokes 3. HTN 4. Occasional UTI 5. On disability for COPD since 2015 6. She had a prior  right breast biopsy in 2001 which was a fibroadenoma. 7. IR placed power port  social History: Separated. She has 4 children: 23, 24, 28, and 33. Her 20 yo daughter lives with her and her 2 children.   Other Problems Kandis Cocking, MD; 07/04/2015 3:45 PM) Back Pain Breast Cancer Chronic Obstructive Lung Disease Depression Lump In Breast  Past Surgical History Kandis Cocking, MD; 07/04/2015 3:45 PM) Breast Biopsy Left. Cesarean Section - 1 Oral Surgery  Allergies Lamar Laundry Bynum, CMA; 07/04/2015 3:04 PM) No Known Drug Allergies01/09/2015  Medication History (Sonya Bynum, CMA; 07/04/2015 3:05 PM) Oxycodone-Acetaminophen (5-325MG  Tablet, Oral) Active. ALPRAZolam (0.25MG  Tablet, Oral) Active. Ibuprofen (800MG  Tablet, Oral) Active. Metoprolol Tartrate (50MG  Tablet, Oral) Active. Ventolin HFA (108 (90 Base)MCG/ACT Aerosol Soln, Inhalation) Active. Paxil (20MG  Tablet, Oral) Active. Medications Reconciled  Vitals (Sonya Bynum CMA; 07/04/2015 3:04 PM) 07/04/2015 3:04 PM Weight: 190 lb Height: 67.5in Body Surface Area: 1.99 m Body Mass Index: 29.32 kg/m  Temp.: 36F(Temporal)  Pulse: 75 (Regular)  BP: 130/80 (Sitting, Left Arm, Standard)   Physical Exam  General: AA F alert and generally healthy appearing. HEENT: Normal. Pupils equal.  Neck: Supple. No mass. No thyroid mass.  Lymph Nodes: No supraclavicular or cervical nodes. She has fullness in her left axilla.  Lungs: Clear to auscultation and symmetric breath sounds. Heart: RRR. No murmur or rub.  Breast: Right - scar at 12 o'clock from prior biopsy Left - When I last saw her, her left breast was significantly larger, had skin changes, and there was a 5 cm UOQ mass I could feel. Her left breast exam is much better, with the abnormal changes improved.  Abdomen: Soft. No mass. No tenderness. No hernia. Normal bowel sounds.   Extremities: Good strength and ROM in  upper and lower extremities.   Assessment & Plan  1.  BREAST CANCER, STAGE 3, LEFT (Z61.096)  Story: Left breast biopsy in 26 December 2014 ([id number]) which showed a invasive left breast cancer that is triple negative. The Ki67 is 80%.  The left axillary lymph node biopsy was positive for metastatic cancer.  T4, N1 (Stage 3B) for now   She has seen Dr. Pamelia Hoit for med onc.   Plan:   1) Completed chemotx   2) Is Dr. Pamelia Hoit finished with chemo tx - can the port come out? No leave port. Alliance trial?  Not a candidate for Alliance trial because of inflammatory nature of breast cancer.   3) She has a sclerosing lesion in the right breast - take it out at the same time?   4) Plastic surgery to evaluate for reconstruciton    Addendum Note(Maclovio Henson H. Ezzard Standing MD; 07/23/2015 2:30 PM)    From Dr. Leta Baptist:    We met today. Given the anticipated radiation, plan to delay reconstruction. She also still trying to quit smoking, another benefit of delay. Told her I would tell you ready to move forward with mastectomy.    Thank you Brinda   5) She is going to quit smoking  Addendum Note(Sandy Haye H. Ezzard Standing MD; 07/24/2015 2:34 PM) I called her mobile number - talked to a female, "[place]", and left a message  2.  BREAST MASS, RIGHT (N63)  Impression: Both benign - but one is sclerosing lesion - will need to be removed.  3.  COPD, MODERATE (J44.9) 4.  SMOKES (F17.200)  5. On disability for COPD since 2015   Ovidio Kin, MD, [facility] Pager: [phone] Office phone:  [phone]

## 2015-08-23 ENCOUNTER — Ambulatory Visit
Admission: RE | Admit: 2015-08-23 | Discharge: 2015-08-23 | Disposition: A | Discharge status: Home / Self Care | Payer: Medicare Other | Source: Ambulatory Visit | Attending: Surgery | Admitting: Surgery

## 2015-08-23 ENCOUNTER — Ambulatory Visit (HOSPITAL_COMMUNITY): Payer: Medicare Other | Admitting: Certified Registered"

## 2015-08-23 ENCOUNTER — Encounter (HOSPITAL_COMMUNITY): Payer: Self-pay | Admitting: General Practice

## 2015-08-23 ENCOUNTER — Observation Stay (HOSPITAL_COMMUNITY)
Admission: RE | Admit: 2015-08-23 | Discharge: 2015-08-24 | Disposition: A | Discharge status: Home / Self Care | Payer: Medicare Other | Source: Ambulatory Visit | Attending: Surgery | Admitting: Surgery

## 2015-08-23 ENCOUNTER — Encounter (HOSPITAL_COMMUNITY)
Admission: RE | Disposition: A | Discharge status: Home / Self Care | Payer: Self-pay | Source: Ambulatory Visit | Attending: Surgery

## 2015-08-23 DIAGNOSIS — C50412 Malignant neoplasm of upper-outer quadrant of left female breast: Secondary | ICD-10-CM | POA: Diagnosis not present

## 2015-08-23 DIAGNOSIS — F17200 Nicotine dependence, unspecified, uncomplicated: Secondary | ICD-10-CM | POA: Insufficient documentation

## 2015-08-23 DIAGNOSIS — F329 Major depressive disorder, single episode, unspecified: Secondary | ICD-10-CM | POA: Diagnosis not present

## 2015-08-23 DIAGNOSIS — I10 Essential (primary) hypertension: Secondary | ICD-10-CM | POA: Insufficient documentation

## 2015-08-23 DIAGNOSIS — J449 Chronic obstructive pulmonary disease, unspecified: Secondary | ICD-10-CM | POA: Diagnosis not present

## 2015-08-23 DIAGNOSIS — N6021 Fibroadenosis of right breast: Secondary | ICD-10-CM

## 2015-08-23 HISTORY — PX: MASTECTOMY WITH AXILLARY LYMPH NODE DISSECTION: SHX5661

## 2015-08-23 HISTORY — PX: MASTECTOMY: SHX3

## 2015-08-23 HISTORY — PX: BREAST LUMPECTOMY WITH RADIOACTIVE SEED LOCALIZATION: SHX6424

## 2015-08-23 SURGERY — BREAST LUMPECTOMY WITH RADIOACTIVE SEED LOCALIZATION
Anesthesia: Regional | Site: Breast | Laterality: Right

## 2015-08-23 MED ORDER — BUPIVACAINE-EPINEPHRINE (PF) 0.25% -1:200000 IJ SOLN
INTRAMUSCULAR | Status: AC
  Filled 2015-08-23: qty 30

## 2015-08-23 MED ORDER — METOPROLOL TARTRATE 50 MG PO TABS
ORAL_TABLET | ORAL | Status: AC
  Administered 2015-08-23: 50 mg
  Filled 2015-08-23: qty 1

## 2015-08-23 MED ORDER — ONDANSETRON HCL 4 MG/2ML IJ SOLN
INTRAMUSCULAR | Status: AC
  Filled 2015-08-23: qty 2

## 2015-08-23 MED ORDER — BUPIVACAINE-EPINEPHRINE 0.25% -1:200000 IJ SOLN
INTRAMUSCULAR | Status: DC | PRN
Start: 2015-08-23 — End: 2015-08-23
  Administered 2015-08-23: 20 mL

## 2015-08-23 MED ORDER — PHENYLEPHRINE HCL 10 MG/ML IJ SOLN
INTRAMUSCULAR | Status: DC | PRN
  Administered 2015-08-23: 40 ug via INTRAVENOUS
  Administered 2015-08-23 (×2): 80 ug via INTRAVENOUS
  Administered 2015-08-23: 40 ug via INTRAVENOUS

## 2015-08-23 MED ORDER — ALBUTEROL SULFATE (2.5 MG/3ML) 0.083% IN NEBU: 2.5000 mg | INHALATION_SOLUTION | Freq: Four times a day (QID) | RESPIRATORY_TRACT | Status: DC | PRN

## 2015-08-23 MED ORDER — ONDANSETRON HCL 4 MG/2ML IJ SOLN
INTRAMUSCULAR | Status: DC | PRN
  Administered 2015-08-23: 4 mg via INTRAVENOUS

## 2015-08-23 MED ORDER — PROPOFOL 1000 MG/100ML IV EMUL
INTRAVENOUS | Status: AC
  Filled 2015-08-23: qty 200

## 2015-08-23 MED ORDER — PROPOFOL 10 MG/ML IV BOLUS
INTRAVENOUS | Status: AC
  Filled 2015-08-23: qty 20

## 2015-08-23 MED ORDER — IBUPROFEN 600 MG PO TABS: 600.0000 mg | ORAL_TABLET | Freq: Four times a day (QID) | ORAL | Status: DC | PRN

## 2015-08-23 MED ORDER — CEFAZOLIN SODIUM-DEXTROSE 2-4 GM/100ML-% IV SOLN
2.0000 g | INTRAVENOUS | Status: AC
  Administered 2015-08-23: 2 g via INTRAVENOUS

## 2015-08-23 MED ORDER — CHLORHEXIDINE GLUCONATE CLOTH 2 % EX PADS: 6.0000 | MEDICATED_PAD | Freq: Once | CUTANEOUS | Status: DC

## 2015-08-23 MED ORDER — SCOPOLAMINE 1 MG/3DAYS TD PT72
MEDICATED_PATCH | TRANSDERMAL | Status: DC | PRN
  Administered 2015-08-23: 1.5 mg via TRANSDERMAL

## 2015-08-23 MED ORDER — ACETAMINOPHEN 325 MG PO TABS: 325.0000 mg | ORAL_TABLET | ORAL | Status: DC | PRN

## 2015-08-23 MED ORDER — ALPRAZOLAM 0.5 MG PO TABS
0.5000 mg | ORAL_TABLET | Freq: Three times a day (TID) | ORAL | Status: DC | PRN
  Administered 2015-08-23 – 2015-08-24 (×2): 0.5 mg via ORAL
  Filled 2015-08-23 (×2): qty 1

## 2015-08-23 MED ORDER — POTASSIUM CHLORIDE IN NACL 20-0.45 MEQ/L-% IV SOLN
INTRAVENOUS | Status: DC
  Administered 2015-08-24: 04:00:00 via INTRAVENOUS
  Filled 2015-08-23: qty 1000

## 2015-08-23 MED ORDER — GLYCOPYRROLATE 0.2 MG/ML IV SOSY
PREFILLED_SYRINGE | INTRAVENOUS | Status: AC
Start: 2015-08-23 — End: 2015-08-23
  Filled 2015-08-23: qty 3

## 2015-08-23 MED ORDER — OXYCODONE HCL 5 MG PO TABS: 5.0000 mg | ORAL_TABLET | Freq: Once | ORAL | Status: DC | PRN

## 2015-08-23 MED ORDER — MAGIC MOUTHWASH W/LIDOCAINE
5.0000 mL | Freq: Two times a day (BID) | ORAL | Status: DC | PRN
  Filled 2015-08-23: qty 5

## 2015-08-23 MED ORDER — CYCLOSPORINE 0.05 % OP EMUL
2.0000 [drp] | Freq: Two times a day (BID) | OPHTHALMIC | Status: DC
  Administered 2015-08-23 – 2015-08-24 (×2): 2 [drp] via OPHTHALMIC
  Filled 2015-08-23 (×2): qty 1

## 2015-08-23 MED ORDER — PHENYLEPHRINE HCL 10 MG/ML IJ SOLN
INTRAMUSCULAR | Status: DC | PRN
  Administered 2015-08-23: 40 ug/min via INTRAVENOUS

## 2015-08-23 MED ORDER — MORPHINE SULFATE (PF) 2 MG/ML IV SOLN
1.0000 mg | INTRAVENOUS | Status: DC | PRN
  Administered 2015-08-23: 2 mg via INTRAVENOUS
  Filled 2015-08-23: qty 1

## 2015-08-23 MED ORDER — OXYCODONE HCL 5 MG/5ML PO SOLN: 5.0000 mg | Freq: Once | ORAL | Status: DC | PRN

## 2015-08-23 MED ORDER — OXYCODONE-ACETAMINOPHEN 5-325 MG PO TABS
1.0000 | ORAL_TABLET | ORAL | Status: DC | PRN
  Administered 2015-08-23: 1 via ORAL
  Administered 2015-08-24 (×2): 2 via ORAL
  Filled 2015-08-23 (×2): qty 2
  Filled 2015-08-23: qty 1

## 2015-08-23 MED ORDER — ACETAMINOPHEN 160 MG/5ML PO SOLN
325.0000 mg | ORAL | Status: DC | PRN
  Filled 2015-08-23: qty 20.3

## 2015-08-23 MED ORDER — LORAZEPAM 0.5 MG PO TABS
0.5000 mg | ORAL_TABLET | Freq: Every day | ORAL | Status: DC
  Administered 2015-08-23: 0.5 mg via ORAL
  Filled 2015-08-23: qty 1

## 2015-08-23 MED ORDER — LIDOCAINE 2% (20 MG/ML) 5 ML SYRINGE
INTRAMUSCULAR | Status: DC | PRN
Start: 2015-08-23 — End: 2015-08-23
  Administered 2015-08-23: 60 mg via INTRAVENOUS

## 2015-08-23 MED ORDER — GABAPENTIN 300 MG PO CAPS
300.0000 mg | ORAL_CAPSULE | ORAL | Status: AC
  Administered 2015-08-23: 300 mg via ORAL

## 2015-08-23 MED ORDER — MIDAZOLAM HCL 2 MG/2ML IJ SOLN
INTRAMUSCULAR | Status: AC
  Filled 2015-08-23: qty 2

## 2015-08-23 MED ORDER — FENTANYL CITRATE (PF) 100 MCG/2ML IJ SOLN: 25.0000 ug | INTRAMUSCULAR | Status: DC | PRN

## 2015-08-23 MED ORDER — 0.9 % SODIUM CHLORIDE (POUR BTL) OPTIME
TOPICAL | Status: DC | PRN
  Administered 2015-08-23 (×2): 1000 mL

## 2015-08-23 MED ORDER — DEXAMETHASONE SODIUM PHOSPHATE 4 MG/ML IJ SOLN
INTRAMUSCULAR | Status: DC | PRN
  Administered 2015-08-23: 5 mg via INTRAVENOUS

## 2015-08-23 MED ORDER — ONDANSETRON 4 MG PO TBDP: 4.0000 mg | ORAL_TABLET | Freq: Four times a day (QID) | ORAL | Status: DC | PRN

## 2015-08-23 MED ORDER — BUPIVACAINE-EPINEPHRINE (PF) 0.5% -1:200000 IJ SOLN
INTRAMUSCULAR | Status: DC | PRN
  Administered 2015-08-23: 25 mL via PERINEURAL

## 2015-08-23 MED ORDER — PAROXETINE HCL 20 MG PO TABS
20.0000 mg | ORAL_TABLET | Freq: Two times a day (BID) | ORAL | Status: DC
  Administered 2015-08-23 – 2015-08-24 (×2): 20 mg via ORAL
  Filled 2015-08-23 (×2): qty 1

## 2015-08-23 MED ORDER — GLYCOPYRROLATE 0.2 MG/ML IJ SOLN
INTRAMUSCULAR | Status: DC | PRN
  Administered 2015-08-23: 0.2 mg via INTRAVENOUS

## 2015-08-23 MED ORDER — EPHEDRINE SULFATE 50 MG/ML IJ SOLN
INTRAMUSCULAR | Status: DC | PRN
  Administered 2015-08-23 (×2): 10 mg via INTRAVENOUS

## 2015-08-23 MED ORDER — GABAPENTIN 300 MG PO CAPS
ORAL_CAPSULE | ORAL | Status: AC
  Administered 2015-08-23: 300 mg via ORAL
  Filled 2015-08-23: qty 1

## 2015-08-23 MED ORDER — FENTANYL CITRATE (PF) 100 MCG/2ML IJ SOLN
INTRAMUSCULAR | Status: AC
  Filled 2015-08-23: qty 2

## 2015-08-23 MED ORDER — MIDAZOLAM HCL 2 MG/2ML IJ SOLN
INTRAMUSCULAR | Status: AC
  Administered 2015-08-23: 1 mg via INTRAVENOUS
  Filled 2015-08-23: qty 2

## 2015-08-23 MED ORDER — ALBUTEROL SULFATE HFA 108 (90 BASE) MCG/ACT IN AERS
1.0000 | INHALATION_SPRAY | Freq: Four times a day (QID) | RESPIRATORY_TRACT | Status: DC | PRN
Start: 2015-08-23 — End: 2015-08-23

## 2015-08-23 MED ORDER — PROPOFOL 10 MG/ML IV BOLUS
INTRAVENOUS | Status: DC | PRN
  Administered 2015-08-23: 200 mg via INTRAVENOUS

## 2015-08-23 MED ORDER — HEPARIN SODIUM (PORCINE) 5000 UNIT/ML IJ SOLN
5000.0000 [IU] | Freq: Three times a day (TID) | INTRAMUSCULAR | Status: DC
  Administered 2015-08-23 – 2015-08-24 (×2): 5000 [IU] via SUBCUTANEOUS
  Filled 2015-08-23 (×2): qty 1

## 2015-08-23 MED ORDER — METOPROLOL TARTRATE 25 MG PO TABS
25.0000 mg | ORAL_TABLET | Freq: Two times a day (BID) | ORAL | Status: DC
  Administered 2015-08-23 – 2015-08-24 (×2): 25 mg via ORAL
  Filled 2015-08-23 (×2): qty 1

## 2015-08-23 MED ORDER — CEFAZOLIN SODIUM-DEXTROSE 2-4 GM/100ML-% IV SOLN
INTRAVENOUS | Status: AC
  Filled 2015-08-23: qty 100

## 2015-08-23 MED ORDER — LABETALOL HCL 5 MG/ML IV SOLN
INTRAVENOUS | Status: AC
  Filled 2015-08-23: qty 4

## 2015-08-23 MED ORDER — MIDAZOLAM HCL 2 MG/2ML IJ SOLN
1.0000 mg | INTRAMUSCULAR | Status: DC | PRN
  Administered 2015-08-23: 1 mg via INTRAVENOUS

## 2015-08-23 MED ORDER — ACETAMINOPHEN 500 MG PO TABS
1000.0000 mg | ORAL_TABLET | ORAL | Status: AC
  Administered 2015-08-23: 1000 mg via ORAL

## 2015-08-23 MED ORDER — LABETALOL HCL 5 MG/ML IV SOLN
INTRAVENOUS | Status: DC | PRN
  Administered 2015-08-23: 5 mg via INTRAVENOUS

## 2015-08-23 MED ORDER — ACETAMINOPHEN 500 MG PO TABS
ORAL_TABLET | ORAL | Status: AC
  Administered 2015-08-23: 1000 mg via ORAL
  Filled 2015-08-23: qty 2

## 2015-08-23 MED ORDER — FENTANYL CITRATE (PF) 100 MCG/2ML IJ SOLN
INTRAMUSCULAR | Status: AC
  Administered 2015-08-23: 50 ug via INTRAVENOUS
  Filled 2015-08-23: qty 2

## 2015-08-23 MED ORDER — FENTANYL CITRATE (PF) 100 MCG/2ML IJ SOLN
50.0000 ug | INTRAMUSCULAR | Status: AC | PRN
  Administered 2015-08-23: 50 ug via INTRAVENOUS
  Administered 2015-08-23: 25 ug via INTRAVENOUS
  Administered 2015-08-23: 50 ug via INTRAVENOUS
  Administered 2015-08-23: 25 ug via INTRAVENOUS

## 2015-08-23 MED ORDER — LACTATED RINGERS IV SOLN
INTRAVENOUS | Status: DC
  Administered 2015-08-23 (×2): via INTRAVENOUS

## 2015-08-23 MED ORDER — ONDANSETRON HCL 4 MG/2ML IJ SOLN: 4.0000 mg | Freq: Four times a day (QID) | INTRAMUSCULAR | Status: DC | PRN

## 2015-08-23 MED ORDER — PROPOFOL 500 MG/50ML IV EMUL
INTRAVENOUS | Status: DC | PRN
  Administered 2015-08-23: 100 ug/kg/min via INTRAVENOUS

## 2015-08-23 MED ORDER — PHENYLEPHRINE 40 MCG/ML (10ML) SYRINGE FOR IV PUSH (FOR BLOOD PRESSURE SUPPORT)
PREFILLED_SYRINGE | INTRAVENOUS | Status: AC
  Filled 2015-08-23: qty 10

## 2015-08-23 MED ORDER — PROPOFOL 1000 MG/100ML IV EMUL
INTRAVENOUS | Status: AC
  Filled 2015-08-23: qty 100

## 2015-08-23 SURGICAL SUPPLY — 77 items
APL SKNCLS STERI-STRIP NONHPOA (GAUZE/BANDAGES/DRESSINGS) ×2
APPLIER CLIP 9.375 MED OPEN (MISCELLANEOUS) ×3
APR CLP MED 9.3 20 MLT OPN (MISCELLANEOUS) ×2
ATCH SMKEVC FLXB CAUT HNDSWH (FILTER) ×2 IMPLANT
BENZOIN TINCTURE PRP APPL 2/3 (GAUZE/BANDAGES/DRESSINGS) ×3 IMPLANT
BINDER BREAST LRG (GAUZE/BANDAGES/DRESSINGS) IMPLANT
BINDER BREAST XLRG (GAUZE/BANDAGES/DRESSINGS) ×1 IMPLANT
BIOPATCH RED 1 DISK 7.0 (GAUZE/BANDAGES/DRESSINGS) ×1 IMPLANT
BLADE SURG 15 STRL LF DISP TIS (BLADE) ×2 IMPLANT
BLADE SURG 15 STRL SS (BLADE)
CANISTER SUCTION 2500CC (MISCELLANEOUS) ×3 IMPLANT
CHLORAPREP W/TINT 26ML (MISCELLANEOUS) ×3 IMPLANT
CLIP APPLIE 9.375 MED OPEN (MISCELLANEOUS) ×2 IMPLANT
CLIP TI WIDE RED SMALL 6 (CLIP) ×3 IMPLANT
COVER PROBE W GEL 5X96 (DRAPES) ×3 IMPLANT
COVER SURGICAL LIGHT HANDLE (MISCELLANEOUS) ×3 IMPLANT
DEVICE DISSECT PLASMABLAD 3.0S (MISCELLANEOUS) ×2 IMPLANT
DEVICE DUBIN SPECIMEN MAMMOGRA (MISCELLANEOUS) ×3 IMPLANT
DRAIN CHANNEL 19F RND (DRAIN) ×6 IMPLANT
DRAPE CHEST BREAST 15X10 FENES (DRAPES) ×2 IMPLANT
DRAPE ORTHO SPLIT 77X108 STRL (DRAPES) ×6
DRAPE PROXIMA HALF (DRAPES) ×3 IMPLANT
DRAPE SURG ORHT 6 SPLT 77X108 (DRAPES) IMPLANT
DRAPE UTILITY XL STRL (DRAPES) ×4 IMPLANT
DRSG PAD ABDOMINAL 8X10 ST (GAUZE/BANDAGES/DRESSINGS) ×3 IMPLANT
ELECT CAUTERY BLADE 6.4 (BLADE) ×3 IMPLANT
ELECT COATED BLADE 2.86 ST (ELECTRODE) ×3 IMPLANT
ELECT REM PT RETURN 9FT ADLT (ELECTROSURGICAL) ×6
ELECTRODE REM PT RTRN 9FT ADLT (ELECTROSURGICAL) ×4 IMPLANT
EVACUATOR SILICONE 100CC (DRAIN) ×6 IMPLANT
EVACUATOR SMOKE ACCUVAC VALLEY (FILTER)
GAUZE SPONGE 4X4 12PLY STRL (GAUZE/BANDAGES/DRESSINGS) ×3 IMPLANT
GLOVE BIO SURGEON STRL SZ7 (GLOVE) ×1 IMPLANT
GLOVE BIO SURGEON STRL SZ7.5 (GLOVE) ×1 IMPLANT
GLOVE BIOGEL PI IND STRL 6.5 (GLOVE) IMPLANT
GLOVE BIOGEL PI IND STRL 7.0 (GLOVE) IMPLANT
GLOVE BIOGEL PI IND STRL 7.5 (GLOVE) IMPLANT
GLOVE BIOGEL PI INDICATOR 6.5 (GLOVE) ×2
GLOVE BIOGEL PI INDICATOR 7.0 (GLOVE) ×3
GLOVE BIOGEL PI INDICATOR 7.5 (GLOVE) ×1
GLOVE SURG SIGNA 7.5 PF LTX (GLOVE) ×3 IMPLANT
GOWN STRL REUS W/ TWL LRG LVL3 (GOWN DISPOSABLE) ×4 IMPLANT
GOWN STRL REUS W/ TWL XL LVL3 (GOWN DISPOSABLE) ×2 IMPLANT
GOWN STRL REUS W/TWL LRG LVL3 (GOWN DISPOSABLE) ×6
GOWN STRL REUS W/TWL XL LVL3 (GOWN DISPOSABLE) ×3
ILLUMINATOR WAVEGUIDE N/F (MISCELLANEOUS) IMPLANT
KIT BASIN OR (CUSTOM PROCEDURE TRAY) ×3 IMPLANT
KIT MARKER MARGIN INK (KITS) ×3 IMPLANT
KIT ROOM TURNOVER OR (KITS) ×3 IMPLANT
LIGHT WAVEGUIDE WIDE FLAT (MISCELLANEOUS) IMPLANT
LIQUID BAND (GAUZE/BANDAGES/DRESSINGS) ×3 IMPLANT
NDL HYPO 25X1 1.5 SAFETY (NEEDLE) ×2 IMPLANT
NEEDLE HYPO 25X1 1.5 SAFETY (NEEDLE) ×3 IMPLANT
NS IRRIG 1000ML POUR BTL (IV SOLUTION) ×3 IMPLANT
PACK GENERAL/GYN (CUSTOM PROCEDURE TRAY) ×3 IMPLANT
PACK SURGICAL SETUP 50X90 (CUSTOM PROCEDURE TRAY) ×3 IMPLANT
PAD ARMBOARD 7.5X6 YLW CONV (MISCELLANEOUS) ×3 IMPLANT
PENCIL BUTTON HOLSTER BLD 10FT (ELECTRODE) ×3 IMPLANT
PLASMABLADE 3.0S (MISCELLANEOUS) ×3
PREFILTER EVAC NS 1 1/3-3/8IN (MISCELLANEOUS) ×2 IMPLANT
SPECIMEN JAR X LARGE (MISCELLANEOUS) ×3 IMPLANT
SPONGE LAP 18X18 X RAY DECT (DISPOSABLE) ×6 IMPLANT
STAPLER VISISTAT 35W (STAPLE) IMPLANT
STRIP CLOSURE SKIN 1/4X4 (GAUZE/BANDAGES/DRESSINGS) ×3 IMPLANT
SUT ETHILON 2 0 FS 18 (SUTURE) ×6 IMPLANT
SUT MNCRL AB 4-0 PS2 18 (SUTURE) ×4 IMPLANT
SUT MON AB 5-0 PS2 18 (SUTURE) ×3 IMPLANT
SUT SILK 3 0 (SUTURE)
SUT SILK 3-0 18XBRD TIE 12 (SUTURE) IMPLANT
SUT VIC AB 3-0 SH 18 (SUTURE) ×4 IMPLANT
SUT VIC AB 3-0 SH 8-18 (SUTURE) ×3 IMPLANT
SYR BULB 3OZ (MISCELLANEOUS) ×2 IMPLANT
SYR CONTROL 10ML LL (SYRINGE) ×3 IMPLANT
TOWEL OR 17X24 6PK STRL BLUE (TOWEL DISPOSABLE) ×2 IMPLANT
TOWEL OR 17X26 10 PK STRL BLUE (TOWEL DISPOSABLE) ×3 IMPLANT
TUBE CONNECTING 12X1/4 (SUCTIONS) ×3 IMPLANT
YANKAUER SUCT BULB TIP NO VENT (SUCTIONS) IMPLANT

## 2015-08-23 NOTE — Anesthesia Preprocedure Evaluation (Signed)
Anesthesia Evaluation  Patient identified by MRN, date of birth, ID band Patient awake    Reviewed: Allergy & Precautions, NPO status , Patient's Chart, lab work & pertinent test results, reviewed documented beta blocker date and time   History of Anesthesia Complications Negative for: history of anesthetic complications  Airway Mallampati: II  TM Distance: >3 FB Neck ROM: Full    Dental  (+) Missing, Loose,    Pulmonary neg shortness of breath, COPD,  COPD inhaler, neg recent URI, Current Smoker,     + decreased breath sounds      Cardiovascular hypertension, Pt. on medications and Pt. on home beta blockers (-) angina(-) Past MI (-) dysrhythmias  Rhythm:Regular     Neuro/Psych PSYCHIATRIC DISORDERS Anxiety Depression negative neurological ROS     GI/Hepatic negative GI ROS, Neg liver ROS,   Endo/Other  negative endocrine ROS  Renal/GU negative Renal ROS     Musculoskeletal  (+) Arthritis ,   Abdominal   Peds  Hematology negative hematology ROS (+)   Anesthesia Other Findings   Reproductive/Obstetrics                             Anesthesia Physical Anesthesia Plan  ASA: III  Anesthesia Plan: General and Regional   Post-op Pain Management:  Regional for Post-op pain   Induction: Intravenous  Airway Management Planned: LMA  Additional Equipment: None  Intra-op Plan:   Post-operative Plan: Extubation in OR  Informed Consent: I have reviewed the patients History and Physical, chart, labs and discussed the procedure including the risks, benefits and alternatives for the proposed anesthesia with the patient or authorized representative who has indicated his/her understanding and acceptance.   Dental advisory given  Plan Discussed with: CRNA and Surgeon  Anesthesia Plan Comments:         Anesthesia Quick Evaluation

## 2015-08-23 NOTE — Discharge Instructions (Signed)
[facility] - DISCHARGE INSTRUCTIONS TO PATIENT  Activity:  Driving - May drive in 2 or 3 days, if doing well and on no pain meds   Lifting - No lifting more than 15 pounds for 10 days.  Wound Care:   Empty drains twice daily and record the amount of drainage.  Bring that record with you to the office. You may shower Monday, 8/21.    Diet:  As tolerated.  Follow up appointment:  Call Dr. Pollie Friar office [facility]) at [phone] for an appointment in 1 week.  Medications and dosages:  Resume your home medications.  You have a prescription for:  Percocet.  Call Dr. Lucia Gaskins or his office  [phone]) if you have:  Temperature greater than 100.4,  Persistent nausea and vomiting,  Severe uncontrolled pain,  Redness, tenderness, or signs of infection (pain, swelling, redness, odor or green/yellow discharge around the site),  Difficulty breathing, headache or visual disturbances,  Any other questions or concerns you may have after discharge.  In an emergency, call 911 or go to an Emergency Department at a nearby hospital.

## 2015-08-23 NOTE — Anesthesia Procedure Notes (Signed)
Procedure Name: LMA Insertion Date/Time: 08/23/2015 10:06 AM Performed by: Huey Romans ANN Pre-anesthesia Checklist: Patient identified, Emergency Drugs available, Suction available and Patient being monitored Patient Re-evaluated:Patient Re-evaluated prior to inductionOxygen Delivery Method: Circle System Utilized Preoxygenation: Pre-oxygenation with 100% oxygen Intubation Type: IV induction Ventilation: Mask ventilation without difficulty LMA: LMA inserted LMA Size: 4.0 Number of attempts: 1 Airway Equipment and Method: Bite block Placement Confirmation: positive ETCO2 Tube secured with: Tape Dental Injury: Teeth and Oropharynx as per pre-operative assessment

## 2015-08-23 NOTE — Op Note (Signed)
08/23/2015  12:57 PM  PATIENT:  Deborah Mcintyre, 60 y.o., female, MRN: [id number]  PREOP DIAGNOSIS:  SCLEROSING LESION OF RIGHT BREAST, LEFT BREAST CANCER  POSTOP DIAGNOSIS:   SCLEROSING LESION OF RIGHT BREAST, LEFT BREAST CANCER,  (T3b, N1)  PROCEDURE:   Procedure(Mcintyre): RIGHT BREAST LUMPECTOMY WITH RADIOACTIVE SEED LOCALIZATION,  LEFT MASTECTOMY WITH AXILLARY LYMPH NODE DISSECTION  SURGEON:   Deborah Mcintyre, M.D.  ASSISTANT:   None  ANESTHESIA:   general  Anesthesiologist: Deborah Ghee, MD CRNA: Deborah Haggard, CRNA; Deborah Rudd, CRNA  General  ASA:  3  EBL:  125  ml  BLOOD ADMINISTERED: none  DRAINS: 2 19 F Blake drians  LOCAL MEDICATIONS USED:   20 cc 1/4% marcaine  SPECIMEN:   Right breast lumpectomy, left modified radical mastectomy (suture - lateral), superior skin flap (long suture caudad, short suture lateral)  COUNTS CORRECT:  YES  INDICATIONS FOR PROCEDURE:  Deborah Mcintyre is a 60 y.o. (DOB: Aug 19, 1955) AA female whose primary care physician is [facility] S, MD and comes for right breast excisional breast biopsy and left modified radical mastectomy.   Deborah Mcintyre was found to have a left breast cancer in December 2016.  The cancer is triple negative.  She has undergone chemotherapy supervised thy Dr. Lindi Mcintyre.  She also was found to have a lesion of the right breast, which on biopsy complex sclerosing lesion.   I will plan to excise this area at the same time as the left mastectomy.    The indications and risks of the surgery were explained to the patient.  The risks include, but are not limited to, infection, bleeding, and nerve injury.  She had a left pectoral block by anesthesia, prior to surgery.  OPERATIVE NOTE;  The patient was taken to room # 2 at Deborah Mcintyre where she underwent a general anesthesia  supervised by Anesthesiologist: Deborah Ghee, MD CRNA: Deborah Haggard, CRNA; Deborah Rudd, CRNA.  Both breast and axilla were prepped with ChloraPrep and  sterilely draped.    A time-out and the surgical check list was reviewed.    First, I did the right breast lumpectomy.  She had a seed placed in the 11 o'clock position of the right breast.  I made a circumareolar incision and excised a block of breast tissue 4 x 3 cm in size.  I did a specimen mammogram to confirm the seed and the ribbon in the breat biopsy.  I then irrigated the wound.  I closed the wound with a 3-0 vicryl subcuticular sutures and 4-0 monocryl in the skin.   I then turned my attention to the left breast.   I made an elliptical incision including the areola in the left breast.  I developed skin flaps medially to the lateral edge of the sternum, inferiorly to the investing fascia of the rectus abdominus muscle, laterally to the anterior edge of the latissimus dorsi muscle, and superiorly to about 2 finger breaths below the clavicle.  The breast was reflected off the pectoralis muscle from medial to lateral.  The lateral attachments in the left axilla were divided and the breast removed.  A long suture was placed on the lateral aspect of the breast.   I dissected into the left axilla and did a left axillary dissection.  The axillary vein was at the most cranial of the dissection.  I dissected under the pectoralis minor muscle to try to remove Level 2 axillary nodes.  There were some firm  nodes removed.  I controlled hemostasis with cautery and cliops.   I brought out 2 74 F Blake drains below the inferior flaps.  These were sewn in place with 2-0 Nylons.  I irrigated the wound with 2.000 cc of fluid.   I did remove excess skin from the superior flap.  I marked this with a long suture on the caudad side and a short suture for the lateral edge of the skin.   The skin was closed with interrupted 3-0 Vicryl sutures and the skin was closed with a 4-0 Monocryl.  The  Wound was painted with LiquidBand and antibiotics strips were placed around the drains.   A pressure dressing was placed on  the wound and the chest wrapped with a breast binder.  Her needle and sponge count were correct at the end of the case.   She was transferred to the recovery room in good condition.  Deborah Overall, MD, [facility] Pager: [phone] Office phone:  [phone]

## 2015-08-23 NOTE — Interval H&P Note (Signed)
History and Physical Interval Note:  08/23/2015 9:19 AM  Deborah Mcintyre  has presented today for surgery, with the diagnosis of SCLEROSING LESION OF RIGHT BREAST and LEFT BREAST CANCER.  The various methods of treatment have been discussed with the patient and family.  She is getting a left pectoral block.  After consideration of risks, benefits and other options for treatment, the patient has consented to  Procedure(s): RIGHT BREAST LUMPECTOMY WITH RADIOACTIVE SEED LOCALIZATION (Right) LEFT MASTECTOMY WITH AXILLARY LYMPH NODE DISSECTION (Left) as a surgical intervention .  The patient's history has been reviewed, patient examined, no change in status, stable for surgery.  I have reviewed the patient's chart and labs.  Questions were answered to the patient's satisfaction.     Jalan Bodi H

## 2015-08-23 NOTE — Anesthesia Procedure Notes (Signed)
Anesthesia Regional Block:  Pectoralis block  Pre-Anesthetic Checklist: ,, timeout performed, Correct Patient, Correct Site, Correct Laterality, Correct Procedure, Correct Position, site marked, Risks and benefits discussed,  Surgical consent,  Pre-op evaluation,  At surgeon's request and post-op pain management  Laterality: Left  Prep: chloraprep       Needles:  Injection technique: Single-shot  Needle Type: Echogenic Needle     Needle Length: 9cm 9 cm Needle Gauge: 21 and 21 G    Additional Needles:  Procedures: ultrasound guided (picture in chart) Pectoralis block Narrative:  Start time: 08/23/2015 9:28 AM End time: 08/23/2015 9:34 AM Injection made incrementally with aspirations every 5 mL.  Performed by: Personally  Anesthesiologist: Marisol Giambra  Additional Notes: Pt tolerated the procedure well.

## 2015-08-23 NOTE — Transfer of Care (Signed)
Immediate Anesthesia Transfer of Care Note  Patient: Deborah Mcintyre  Procedure(s) Performed: Procedure(s): RIGHT BREAST LUMPECTOMY WITH RADIOACTIVE SEED LOCALIZATION (Right) LEFT MASTECTOMY WITH AXILLARY LYMPH NODE DISSECTION (Left)  Patient Location: PACU  Anesthesia Type:GA combined with regional for post-op pain  Level of Consciousness: alert  and oriented  Airway & Oxygen Therapy: Patient Spontanous Breathing and Patient connected to face mask oxygen  Post-op Assessment: Report given to RN and Post -op Vital signs reviewed and stable  Post vital signs: Reviewed and stable  Last Vitals:  Vitals:   08/23/15 0823 08/23/15 0856  BP: (!) 132/106 (!) 156/101  Pulse: 97   Resp: 20   Temp: 37 C     Last Pain:  Vitals:   08/23/15 0823  TempSrc: Oral      Patients Stated Pain Goal: 2 (33/74/45 1460)  Complications: No apparent anesthesia complications

## 2015-08-24 DIAGNOSIS — C50412 Malignant neoplasm of upper-outer quadrant of left female breast: Secondary | ICD-10-CM | POA: Diagnosis not present

## 2015-08-24 MED ORDER — OXYCODONE-ACETAMINOPHEN 5-325 MG PO TABS: 1.0000 | ORAL_TABLET | ORAL | 0 refills | Status: DC | PRN

## 2015-08-24 NOTE — Anesthesia Postprocedure Evaluation (Signed)
Anesthesia Post Note  Patient: Deborah Mcintyre  Procedure(s) Performed: Procedure(s) (LRB): RIGHT BREAST LUMPECTOMY WITH RADIOACTIVE SEED LOCALIZATION (Right) LEFT MASTECTOMY WITH AXILLARY LYMPH NODE DISSECTION (Left)  Patient location during evaluation: PACU Anesthesia Type: General Level of consciousness: awake and alert and patient cooperative Pain management: pain level controlled Vital Signs Assessment: post-procedure vital signs reviewed and stable Respiratory status: spontaneous breathing and respiratory function stable Cardiovascular status: stable Anesthetic complications: no    Last Vitals:  Vitals:   08/24/15 0240 08/24/15 0638  BP: (!) 144/72 (!) 141/82  Pulse: 73 73  Resp:    Temp: 37.1 C 37.1 C    Last Pain:  Vitals:   08/24/15 0638  TempSrc: Oral  PainSc:                  [place]

## 2015-08-24 NOTE — Discharge Planning (Signed)
Patient instructed on JP care, verbalizes understanding. Reviewed AVS and given to pt. And rx. Given JP supplies also. Discharged ambulatory to private car home with all personal belongings accompanied by husband at 51.

## 2015-08-24 NOTE — Discharge Summary (Signed)
Physician Discharge Summary  Patient ID: Deborah Mcintyre MRN: [id number] DOB/AGE: 05-18-1955 60 y.o.  Admit date: 08/23/2015 Discharge date: 08/24/2015  Admission Diagnoses:  Breast cancer  Discharge Diagnoses:  same  Active Problems:   Breast cancer of upper-outer quadrant of left female breast [facility])   Surgery:  PREOP DIAGNOSIS:  SCLEROSING LESION OF RIGHT BREAST, LEFT BREAST CANCER  POSTOP DIAGNOSIS:   SCLEROSING LESION OF RIGHT BREAST, LEFT BREAST CANCER,  (T3b, N1)  PROCEDURE:   Procedure(s): RIGHT BREAST LUMPECTOMY WITH RADIOACTIVE SEED LOCALIZATION,  LEFT MASTECTOMY WITH AXILLARY LYMPH NODE DISSECTION   Discharged Condition: same  Hospital Course:   Had surgery on Friday afternoon and kept overnight and discharged on Saturday morning  Consults: none  Significant Diagnostic Studies: path pending    Discharge Exam: Blood pressure (!) 141/82, pulse 73, temperature 98.7 F (37.1 C), temperature source Oral, resp. rate 17, height '5\' 7"'$  (1.702 m), weight 86.4 kg (190 lb 7.6 oz), SpO2 99 %. Left axillary drain.  Breast binder in place.   Disposition: 01-Home or Self Care  Discharge Instructions    Call MD for:  redness, tenderness, or signs of infection (pain, swelling, redness, odor or green/yellow discharge around incision site)    Complete by:  As directed   Call MD for:  temperature >100.4    Complete by:  As directed   Diet - low sodium heart healthy    Complete by:  As directed   Discharge wound care:    Complete by:  As directed   Drain and record JP bulb output at least every 12 hours.   Increase activity slowly    Complete by:  As directed       Medication List    TAKE these medications   albuterol (2.5 MG/3ML) 0.083% nebulizer solution Commonly known as:  PROVENTIL Take 2.5 mg by nebulization every 6 (six) hours as needed for wheezing or shortness of breath.   albuterol 108 (90 Base) MCG/ACT inhaler Commonly known as:  PROVENTIL HFA;VENTOLIN  HFA Inhale 1-2 puffs into the lungs every 6 (six) hours as needed for wheezing or shortness of breath.   ALPRAZolam 0.5 MG tablet Commonly known as:  XANAX Take 1 tablet (0.5 mg total) by mouth 3 (three) times daily as needed for anxiety or sleep.   cycloSPORINE 0.05 % ophthalmic emulsion Commonly known as:  RESTASIS Place 2 drops into both eyes 2 (two) times daily.   ibuprofen 200 MG tablet Commonly known as:  ADVIL,MOTRIN Take 200 mg by mouth every 6 (six) hours as needed. Reported on 03/22/2015   LORazepam 0.5 MG tablet Commonly known as:  ATIVAN Take 1 tablet (0.5 mg total) by mouth at bedtime.   magic mouthwash w/lidocaine Soln Take 5 mLs by mouth 2 (two) times daily as needed for mouth pain.   metoprolol 50 MG tablet Commonly known as:  LOPRESSOR Take 0.5 tablets (25 mg total) by mouth 2 (two) times daily. What changed:  how much to take   ondansetron 8 MG tablet Commonly known as:  ZOFRAN TAKE 1 TABLET TWICE DAILY AS NEEDED STARTING 3RD DAY AFTER CHEMOTHERAPY   oxyCODONE-acetaminophen 5-325 MG tablet Commonly known as:  PERCOCET/ROXICET Take 1 tablet by mouth every 8 (eight) hours as needed for severe pain. What changed:  Another medication with the same name was added. Make sure you understand how and when to take each.   oxyCODONE-acetaminophen 5-325 MG tablet Commonly known as:  PERCOCET/ROXICET Take 1-2 tablets by mouth every 4 (  four) hours as needed for moderate pain. What changed:  You were already taking a medication with the same name, and this prescription was added. Make sure you understand how and when to take each.   PARoxetine 20 MG tablet Commonly known as:  PAXIL Take 20 mg by mouth 2 (two) times daily.   prochlorperazine 10 MG tablet Commonly known as:  COMPAZINE TAKE 1 TABLET (10 MG TOTAL) BY MOUTH EVERY 6 (SIX) HOURS AS NEEDED (NAUSEA OR VOMITING).   tetrahydrozoline 0.05 % ophthalmic solution Place 1 drop into both eyes daily as needed (dry  eye).      Follow-up Information    NEWMAN,DAVID H, MD. Schedule an appointment as soon as possible for a visit in 1 week(s).   Specialty:  General Surgery Contact information: [address] [place]  [id number] [phone]           Signed: Pedro Earls 08/24/2015, 7:44 AM

## 2015-08-26 ENCOUNTER — Encounter (HOSPITAL_COMMUNITY): Payer: Self-pay | Admitting: Surgery

## 2015-08-28 NOTE — Progress Notes (Signed)
Location of Breast Cancer:Right And Left Breast  Histology per Pathology Report: Diagnosis 02/04/2015: 1. Breast, right, needle core biopsy, central- COMPLEX SCLEROSING LESION, SEE COMMENT. 2. Breast, right, needle core biopsy, lower out right - BENIGN BREAST TISSUE, COMMENT.- NEGATIVE FOR ATYPIA OR MALIGNANCY.  Diagnosis 12/26/2014: 1. Breast, left, needle core biopsy, upper outer quadrant, 2:00 o'clock 8 cm from nipple - INVASIVE DUCTAL CARCINOMA. - LYMPHOVASCULAR INVASION IS IDENTIFIED. - SEE COMMENT. 2. Lymph node, needle/core biopsy, left axillary node, level 2 - METASTATIC CARCINOMA IN 1 OF 1 LYMPH NODE (1/1).  Receptor Status: ER(neg), PR (neg), Her2-neu (neg), Ki-()  Did patient present with symptoms (if so, please note symptoms) or was this found on screening mammography?: patient noticed changes in left breast 12/2014  Past/Anticipated interventions by surgeon, if YSH:[id number] 08/23/15:Dr. Misty Stanley 1. Breast, lumpectomy, Right- TUBULAR ADENOMA WITH CALCIFICATIONS.- FIBROCYSTIC CHANGES WITH ADENOSIS AND CALCIFICATIONS.- USUAL DUCTAL HYPERPLASIA.- HEALING BIOPSY SITE.- THERE IS NO EVIDENCE OF MALIGNANCY.- SEE COMMENT. 2. Breast, modified radical mastectomy , Left - INVASIVE DUCTAL CARCINOMA WITH CALCIFICATIONS, GRADE III/III, SPANNING 1.3 CM.- LOBULAR NEOPLASIA (ATYPICAL LOBULAR HYPERPLASIA).- THE SURGICAL RESECTION MARGINS ARE NEGATIVE FOR DUCTAL CARCINOMA.- METASTATIC CARCINOMA IN 1 OF 6 LYMPH NODE (1/6) WITH EXTRACAPSULAR EXTENSION.- SEE ONCOLOGY TABLE BELOW. 3. Skin , Left Superior Flap- BENIGN SKIN.  Follow up in office 1 week with Dr. Misty Stanley, saw on Friday 08/30/15 took 1 j/p drain out , another still in ,  Follow up 09/05/15   Past/Anticipated interventions by medical oncology, if any: Chemotherapy :Dr. Epimenio Sarin neo adjuvant chemotherapy ,dose dense  AC x4 followed by Taxol  And carboplatin weekly x 12 ,Follow up 08/30/15  Lymphedema issues, if any:   lft arm, tightness under axilla and nun=mbness there  Pain issues, if any:  Profound neuropathy , has a j/p darin left rib next to breast, draining serrous sanguis liquid   SAFETY ISSUES:  Prior radiation?  No  Pacemaker/ICD?  No  Possible current pregnancy? No  Is the patient on methotrexate?  No  Current Complaints / other details:  Separated, 4 children,(1 c-section) ,  Disability for COPD since 2015,,Hx Right Breast Biopsy 2001=fibroadenoma, Depression,Anxiety    Maternal aunt unknown cancer, maternal cousins, 72 female brain cancer, another female stomach cancer  Allergies: NKA  Roslynn Amble, Felicita Gage, RN 08/28/2015,10:53 AM BP (!) 134/93 (BP Location: Right Arm, Patient Position: Sitting, Cuff Size: Normal)   Pulse 78   Temp 97.6 F (36.4 C) (Oral)   Resp 20   Ht 5' 7.5" (1.715 m)   Wt 190 lb 8 oz (86.4 kg)   SpO2 100%   BMI 29.40 kg/m  Wt Readings from Last 3 Encounters:  09/02/15 190 lb 8 oz (86.4 kg)  08/30/15 190 lb 12.8 oz (86.5 kg)  08/23/15 190 lb 7.6 oz (86.4 kg)

## 2015-08-30 ENCOUNTER — Ambulatory Visit (HOSPITAL_BASED_OUTPATIENT_CLINIC_OR_DEPARTMENT_OTHER): Payer: Medicare Other | Admitting: Hematology and Oncology

## 2015-08-30 ENCOUNTER — Telehealth: Payer: Self-pay | Admitting: Hematology and Oncology

## 2015-08-30 ENCOUNTER — Encounter: Payer: Self-pay | Admitting: Hematology and Oncology

## 2015-08-30 DIAGNOSIS — C773 Secondary and unspecified malignant neoplasm of axilla and upper limb lymph nodes: Secondary | ICD-10-CM | POA: Diagnosis not present

## 2015-08-30 DIAGNOSIS — C50412 Malignant neoplasm of upper-outer quadrant of left female breast: Secondary | ICD-10-CM

## 2015-08-30 NOTE — Telephone Encounter (Signed)
appt made and avs printed °

## 2015-08-30 NOTE — Progress Notes (Signed)
Patient Care Team: [name], MD as PCP - General (Internal Medicine) Nicholas Lose, MD as Consulting Physician (Hematology and Oncology) Alphonsa Overall, MD as Consulting Physician (General Surgery)  DIAGNOSIS: No matching staging information was found for the patient.  SUMMARY OF ONCOLOGIC HISTORY:   Breast cancer of upper-outer quadrant of left female breast ([place])   12/19/2014 Mammogram    Left breast palpable mass at 2:00: 4 x 3.8 x 2.5 cm, enlarged left axillary lymph node 4 cm in size with diffuse skin thickening [place] 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      12/26/2014 Initial Diagnosis    Left breast biopsy: Invasive ductal carcinoma with lymphovascular invasion, 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, grade 3, ER/PR negative, Ki-67 80% HER-2 Neg      01/18/2015 - 06/21/2015 Neo-Adjuvant Chemotherapy    Dose dense Adriamycin and Cytoxan 4 followed by Taxol and carboplatin [id number]      07/03/2015 Breast MRI    Improvement in multiple areas of enhancement largest area 3.4 cm other suspicious nodules anterior to the mass also improved, left axillary internal mammary lymph nodes improved      08/23/2015 Surgery    Left mastectomy with axillary lymph node dissection: Invasive ductal carcinoma with calcifications grade 3, 1.3 cm, image, margins negative, 1/6 lymph nodes positive with extracapsular extension, ER 0%, 0%, T1cN1a stage II a        CHIEF COMPLIANT: Follow-up after recent left mastectomy  INTERVAL HISTORY: Deborah Mcintyre is a 60-year-old with above-mentioned history of left breast cancer treated with neoadjuvant chemotherapy followed by left mastectomy and axillary lymph node dissection. She is here today to discuss results. She is complaining of burning sensation and numbness underneath the arm towards back. She continues to have a drain in place. She missed her appointment to see surgery today.  REVIEW OF SYSTEMS:     Constitutional: Denies fevers, chills or abnormal weight loss Eyes: Denies blurriness of vision Ears, nose, mouth, throat, and face: Denies mucositis or sore throat Respiratory: Denies cough, dyspnea or wheezes Cardiovascular: Denies palpitation, chest discomfort Gastrointestinal:  Denies nausea, heartburn or change in bowel habits Skin: Denies abnormal skin rashes Lymphatics: Denies new lymphadenopathy or easy bruising Neurological:Denies numbness, tingling or new weaknesses Behavioral/Psych: Mood is stable, no new changes  Extremities: No lower extremity edema Breast: Recent left mastectomy All other systems were reviewed with the patient and are negative.  I have reviewed the past medical history, past surgical history, social history and family history with the patient and they are unchanged from previous note.  ALLERGIES:  has No Known Allergies.  MEDICATIONS:  Current Outpatient Prescriptions  Medication Sig Dispense Refill  . albuterol (PROVENTIL HFA;VENTOLIN HFA) 108 (90 Base) MCG/ACT inhaler Inhale 1-2 puffs into the lungs every 6 (six) hours as needed for wheezing or shortness of breath.    [name] albuterol (PROVENTIL) (2.5 MG/3ML) 0.083% nebulizer solution Take 2.5 mg by nebulization every 6 (six) hours as needed for wheezing or shortness of breath.    . ALPRAZolam (XANAX) 0.5 MG tablet Take 1 tablet (0.5 mg total) by mouth 3 (three) times daily as needed for anxiety or sleep. 60 tablet 0  . cycloSPORINE (RESTASIS) 0.05 % ophthalmic emulsion Place 2 drops into both eyes 2 (two) times daily.    [name] ibuprofen (ADVIL,MOTRIN) 200 MG tablet Take 200 mg by mouth every 6 (six) hours as needed. Reported on 03/22/2015    . LORazepam (ATIVAN) 0.5 MG  tablet Take 1 tablet (0.5 mg total) by mouth at bedtime. 30 tablet 0  . magic mouthwash w/lidocaine SOLN Take 5 mLs by mouth 2 (two) times daily as needed for mouth pain.    . metoprolol (LOPRESSOR) 50 MG tablet Take 0.5 tablets (25 mg total) by  mouth 2 (two) times daily. (Patient taking differently: Take 50 mg by mouth 2 (two) times daily. )    . ondansetron (ZOFRAN) 8 MG tablet TAKE 1 TABLET TWICE DAILY AS NEEDED STARTING 3RD DAY AFTER CHEMOTHERAPY 20 tablet 1  . oxyCODONE-acetaminophen (PERCOCET/ROXICET) 5-325 MG tablet Take 1 tablet by mouth every 8 (eight) hours as needed for severe pain. 60 tablet 0  . oxyCODONE-acetaminophen (PERCOCET/ROXICET) 5-325 MG tablet Take 1-2 tablets by mouth every 4 (four) hours as needed for moderate pain. 30 tablet 0  . PARoxetine (PAXIL) 20 MG tablet Take 20 mg by mouth 2 (two) times daily.     . prochlorperazine (COMPAZINE) 10 MG tablet TAKE 1 TABLET (10 MG TOTAL) BY MOUTH EVERY 6 (SIX) HOURS AS NEEDED (NAUSEA OR VOMITING). 30 tablet 1  . tetrahydrozoline 0.05 % ophthalmic solution Place 1 drop into both eyes daily as needed (dry eye).      No current facility-administered medications for this visit.     PHYSICAL EXAMINATION: ECOG PERFORMANCE STATUS: 1 - Symptomatic but completely ambulatory  Vitals:   08/30/15 1134  BP: 111/88  Pulse: 77  Resp: 18  Temp: 97.9 F (36.6 C)   Filed Weights   08/30/15 1134  Weight: 190 lb 12.8 oz (86.5 kg)    GENERAL:alert, no distress and comfortable SKIN: skin color, texture, turgor are normal, no rashes or significant lesions EYES: normal, Conjunctiva are pink and non-injected, sclera clear OROPHARYNX:no exudate, no erythema and lips, buccal mucosa, and tongue normal  NECK: supple, thyroid normal size, non-tender, without nodularity LYMPH:  no palpable lymphadenopathy in the cervical, axillary or inguinal LUNGS: clear to auscultation and percussion with normal breathing effort HEART: regular rate & rhythm and no murmurs and no lower extremity edema ABDOMEN:abdomen soft, non-tender and normal bowel sounds MUSCULOSKELETAL:no cyanosis of digits and no clubbing  NEURO: alert & oriented x 3 with fluent speech, no focal motor/sensory deficits EXTREMITIES:  No lower extremity edema   LABORATORY DATA:  I have reviewed the data as listed   Chemistry      Component Value Date/Time   NA 140 08/20/2015 1356   NA 140 06/21/2015 1142   K 3.7 08/20/2015 1356   K 4.2 06/21/2015 1142   CL 107 08/20/2015 1356   CO2 24 08/20/2015 1356   CO2 27 06/21/2015 1142   BUN 13 08/20/2015 1356   BUN 13.2 06/21/2015 1142   CREATININE 0.74 08/20/2015 1356   CREATININE 0.8 06/21/2015 1142      Component Value Date/Time   CALCIUM 9.3 08/20/2015 1356   CALCIUM 9.2 06/21/2015 1142   ALKPHOS 56 06/21/2015 1142   AST 13 06/21/2015 1142   ALT 10 06/21/2015 1142   BILITOT <0.30 06/21/2015 1142       Lab Results  Component Value Date   WBC 4.3 08/20/2015   HGB 13.1 08/20/2015   HCT 41.2 08/20/2015   MCV 99.8 08/20/2015   PLT 232 08/20/2015   NEUTROABS 2.2 06/21/2015     ASSESSMENT & PLAN:  Breast cancer of upper-outer quadrant of left female breast ([place]) Left breast biopsy 12/26/14: inflammatory breast cancer Invasive ductal carcinoma with lymphovascular invasion, 1/1 left axillary lymph node positive for metastatic  carcinoma, grade 3, ER/PR negative, Her 2 Negative Left breast palpable mass at 2:00: 4 x 3.8 x 2.5 cm, enlarged left axillary lymph node 4 cm in size with diffuse skin thickening T4 N1 (Stage 3B).  Treatment summary: 1. Neoadjuvant chemotherapy with dose dense Adriamycin and Cytoxan 4 followed by Taxol and carboplatin 12 weekly from 01/18/2015 to 06/21/2015 2. left Mastectomy With axillary lymph node dissection 08/23/2015: Invasive ductal carcinoma with calcifications grade 3, 1.3 cm, image, margins negative, 1/6 lymph nodes positive with extracapsular extension, ER 0%, 0%, T1cN1a stage IIA 3. Plan: Adjuvant XRT  Discussion: discussed the positive lymph node in the axilla  I recommended that she participated in clinical trial SWOG 1418 with Pembrolizumab.  Alternately we can treat her with adjuvant Xeloda. CREATE-X study enrolled  910 patient's with triple negative breast cancer and residual disease after neo-adjuvant therapy. 455 patients assigned to 1250 mg/m times daily 2 weeks on 1 week off  up to 8 cycles, at 5 years PFS 74.1% with Xeloda compared to 67.7% in control arm, 30% reduction in risk, overall survival 89.2% versus 83.9%, hand-foot syndrome was seen in 72.3% with Xeloda grade 3 in 10.9%  We will present her the tumor board and make a final decision. Return to clinic in 3 months after radiation to finalize the treatment plan.  No orders of the defined types were placed in this encounter.  The patient has a good understanding of the overall plan. she agrees with it. she will call with any problems that may develop before the next visit here.   Rulon Eisenmenger, MD 08/30/15

## 2015-08-30 NOTE — Assessment & Plan Note (Signed)
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  Treatment summary: 1. Neoadjuvant chemotherapy with dose dense Adriamycin and Cytoxan 4 followed by Taxol and carboplatin 12 weekly from 01/18/2015 to 06/21/2015 2. left Mastectomy 08/23/2015: Invasive ductal carcinoma with calcifications grade 3, 1.3 cm, image, margins negative, 1/6 lymph nodes positive with extracapsular extension, ER 0%, 0%, T1cN1a stage IIA 3. Plan: Adjuvant XRT  Discussion: discussed the positive lymph node in the axilla and that it may require axillary lymph node dissection. She could be enrolled in the [employer]IANCE clinical trial that randomizes to lymph node dissection versus observation.  Subsequently she will be eligible to participate in the SWOG [id number] clinical trial with Pembrolizumab. Alternately we can treat her with adjuvant Xeloda. CREATE-X study enrolled 910 patient's with triple negative breast cancer and residual disease after neo-adjuvant therapy. 455 patients assigned to 1250 mg/m times daily 2 weeks on 1 week off  up to 8 cycles, at 5 years PFS 74.1% with Xeloda compared to 67.7% in control arm, 30% reduction in risk, overall survival 89.2% versus 83.9%, hand-foot syndrome was seen in 72.3% with Xeloda grade 3 in 10.9%  We will present her the tumor board and make a final decision.

## 2015-09-02 ENCOUNTER — Encounter: Payer: Self-pay | Admitting: Radiation Oncology

## 2015-09-02 ENCOUNTER — Ambulatory Visit
Admission: RE | Admit: 2015-09-02 | Discharge: 2015-09-02 | Disposition: A | Discharge status: Home / Self Care | Payer: Medicare Other | Source: Ambulatory Visit | Attending: Radiation Oncology | Admitting: Radiation Oncology

## 2015-09-02 VITALS — BP 134/93 | HR 78 | Temp 97.6°F | Resp 20 | Ht 67.5 in | Wt 190.5 lb

## 2015-09-02 DIAGNOSIS — F17210 Nicotine dependence, cigarettes, uncomplicated: Secondary | ICD-10-CM | POA: Insufficient documentation

## 2015-09-02 DIAGNOSIS — F329 Major depressive disorder, single episode, unspecified: Secondary | ICD-10-CM | POA: Insufficient documentation

## 2015-09-02 DIAGNOSIS — F419 Anxiety disorder, unspecified: Secondary | ICD-10-CM | POA: Insufficient documentation

## 2015-09-02 DIAGNOSIS — Z79899 Other long term (current) drug therapy: Secondary | ICD-10-CM | POA: Insufficient documentation

## 2015-09-02 DIAGNOSIS — Z9221 Personal history of antineoplastic chemotherapy: Secondary | ICD-10-CM | POA: Insufficient documentation

## 2015-09-02 DIAGNOSIS — Z510 Encounter for antineoplastic radiation therapy: Secondary | ICD-10-CM | POA: Insufficient documentation

## 2015-09-02 DIAGNOSIS — M1990 Unspecified osteoarthritis, unspecified site: Secondary | ICD-10-CM | POA: Insufficient documentation

## 2015-09-02 DIAGNOSIS — C50412 Malignant neoplasm of upper-outer quadrant of left female breast: Secondary | ICD-10-CM | POA: Diagnosis not present

## 2015-09-02 DIAGNOSIS — Z9012 Acquired absence of left breast and nipple: Secondary | ICD-10-CM | POA: Insufficient documentation

## 2015-09-02 DIAGNOSIS — J449 Chronic obstructive pulmonary disease, unspecified: Secondary | ICD-10-CM | POA: Diagnosis not present

## 2015-09-02 DIAGNOSIS — Z171 Estrogen receptor negative status [ER-]: Secondary | ICD-10-CM | POA: Diagnosis not present

## 2015-09-02 DIAGNOSIS — I10 Essential (primary) hypertension: Secondary | ICD-10-CM | POA: Insufficient documentation

## 2015-09-02 NOTE — Progress Notes (Signed)
Radiation Oncology         [phone] ________________________________  Name: Deborah Mcintyre MRN: [id number]  Date: 09/02/2015  DOB: 1955-06-09  QQ:[id number],FIEP S, MD  Nicholas Lose, MD     REFERRING PHYSICIAN: Nicholas Lose, MD   DIAGNOSIS: The encounter diagnosis was Breast cancer of upper-outer quadrant of left female breast ([place]).  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 female seen at the request of Dr. Lindi Adie for invasive ductal carcinoma of the left breast.    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      12/26/2014 Initial Diagnosis    Left breast biopsy: Invasive ductal carcinoma with lymphovascular invasion, 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, grade 3, ER/PR negative, Ki-67 80% HER-2 Neg      01/18/2015 - 06/21/2015 Neo-Adjuvant Chemotherapy    Dose dense Adriamycin and Cytoxan 4 followed by Taxol and carboplatin [id number]      07/03/2015 Breast MRI    Improvement in multiple areas of enhancement largest area 3.4 cm other suspicious nodules anterior to the mass also improved, left axillary internal mammary lymph nodes improved      08/23/2015 Surgery    Left mastectomy with axillary lymph node dissection: Invasive ductal carcinoma with calcifications grade 3, 1.3 cm, image, margins negative, 1/6 lymph nodes positive with extracapsular extension, ER 0%, 0%, T1cN1a stage II a       In addition to the imaging noted above the patient had an MRI of the bilateral breasts on 01/10/15. This noted a mass measuring 1.0 x 0.9 x 0.7 cm in the central right breast, a 2.5 x 1.4 x 0.8 cm group of 3  enhancing masses in the posterior right breast, a 5.6 x 4.0 x 3.5 cm mass in the posterior aspect of the UOQ of the left breast (biopsy proven IDC), a 2.3 x 2.1 x 2.1 cm mass more anteriorly in the UOQ of the left breast, an area measuring 11.8 x 10.5 x 5.6 cm with multiple nodular enhancing masses in the left breast, and multiple abnormally enlarged left axillary lymph nodes and multiple enlarged left internal mammary lymph nodes.  On 02/04/15, biopsy of the central right breast revealed a complex sclerosing lesion and biopsy of the lower outer right breast revealed benign breast tissue. On 08/23/15, in addition to the left modified radical mastectomy, the patient had a right lumpectomy revealed tubular adenoma with calcifications, fibrocystic changes with adenosis and calcifications, and usual ductal hyperplasia.  On 08/30/15, the patient followed up with Dr. Lindi Adie who discussed the positive lymph node in the left axilla. He recommened that she either participat in clinical trial SWOG 1418 with Pembrolizumab or treat her with adjuvant Xeloda. She will return to med onc in 3 months to finalize her treatment plan. In the interim, the patient has been referred to radiation oncology to discuss the role of radiation in the management of her disease.  PREVIOUS RADIATION THERAPY: No   PAST MEDICAL HISTORY:  Past Medical History:  Diagnosis Date  . Anxiety   . Arthritis   . Bronchitis   . COPD (chronic obstructive pulmonary  disease) ([place])   . Depression   . Hypertension   . Malignant neoplasm of upper-outer quadrant of left female breast ([place]) 01/04/2015  . Nocturia   . Numbness and tingling    toes - bilateral  . Pneumonia   . PONV (postoperative nausea and vomiting)    Nausea       PAST SURGICAL HISTORY: Past Surgical History:  Procedure Laterality Date  . BREAST LUMPECTOMY Bilateral    x3  . BREAST LUMPECTOMY WITH RADIOACTIVE SEED LOCALIZATION Right 08/23/2015   Procedure: RIGHT BREAST  LUMPECTOMY WITH RADIOACTIVE SEED LOCALIZATION;  Surgeon: Alphonsa Overall, MD;  Location: [facility];  Service: General;  Laterality: Right;  . CESAREAN SECTION     x4  . MASTECTOMY Left 08/23/2015    RIGHT BREAST LUMPECTOMY WITH RADIOACTIVE SEED LOCALIZATION,  LEFT MASTECTOMY WITH AXILLARY LYMPH NODE DISSECTION  . MASTECTOMY WITH AXILLARY LYMPH NODE DISSECTION Left 08/23/2015   Procedure: LEFT MASTECTOMY WITH AXILLARY LYMPH NODE DISSECTION;  Surgeon: Alphonsa Overall, MD;  Location: [facility];  Service: General;  Laterality: Left;  [name] MULTIPLE TOOTH EXTRACTIONS       FAMILY HISTORY: No family history on file.   SOCIAL HISTORY:  reports that she has been smoking Cigarettes.  She has a 33.75 pack-year smoking history. She has never used smokeless tobacco. She reports that she drinks alcohol. She reports that she does not use drugs.   ALLERGIES: Review of patient's allergies indicates no known allergies.   MEDICATIONS:  Current Outpatient Prescriptions  Medication Sig Dispense Refill  . albuterol (PROVENTIL HFA;VENTOLIN HFA) 108 (90 Base) MCG/ACT inhaler Inhale 1-2 puffs into the lungs every 6 (six) hours as needed for wheezing or shortness of breath.    [name] albuterol (PROVENTIL) (2.5 MG/3ML) 0.083% nebulizer solution Take 2.5 mg by nebulization every 6 (six) hours as needed for wheezing or shortness of breath.    . ALPRAZolam (XANAX) 0.5 MG tablet Take 1 tablet (0.5 mg total) by mouth 3 (three) times daily as needed for anxiety or sleep. 60 tablet 0  . cycloSPORINE (RESTASIS) 0.05 % ophthalmic emulsion Place 2 drops into both eyes 2 (two) times daily.    [name] ibuprofen (ADVIL,MOTRIN) 200 MG tablet Take 200 mg by mouth every 6 (six) hours as needed. Reported on 03/22/2015    . LORazepam (ATIVAN) 0.5 MG tablet Take 1 tablet (0.5 mg total) by mouth at bedtime. 30 tablet 0  . magic mouthwash w/lidocaine SOLN Take 5 mLs by mouth 2 (two) times daily as needed for mouth pain.    . metoprolol (LOPRESSOR) 50 MG tablet  Take 0.5 tablets (25 mg total) by mouth 2 (two) times daily. (Patient taking differently: Take 50 mg by mouth 2 (two) times daily. )    . ondansetron (ZOFRAN) 8 MG tablet TAKE 1 TABLET TWICE DAILY AS NEEDED STARTING 3RD DAY AFTER CHEMOTHERAPY 20 tablet 1  . oxyCODONE-acetaminophen (PERCOCET/ROXICET) 5-325 MG tablet Take 1 tablet by mouth every 8 (eight) hours as needed for severe pain. 60 tablet 0  . oxyCODONE-acetaminophen (PERCOCET/ROXICET) 5-325 MG tablet Take 1-2 tablets by mouth every 4 (four) hours as needed for moderate pain. 30 tablet 0  . PARoxetine (PAXIL) 20 MG tablet Take 20 mg by mouth 2 (two) times daily.     . prochlorperazine (COMPAZINE) 10 MG tablet TAKE 1 TABLET (10 MG TOTAL) BY MOUTH EVERY 6 (SIX) HOURS AS NEEDED (NAUSEA OR VOMITING). 30 tablet 1  . tetrahydrozoline 0.05 % ophthalmic solution Place 1 drop into both eyes daily  as needed (dry eye).      No current facility-administered medications for this encounter.     REVIEW OF SYSTEMS: On review of systems, the patient reports left arm numbness and tightness under her left axilla. She also reports neuropathy and left rib pain. A complete review of systems is obtained and is otherwise negative.  PHYSICAL EXAM:  height is 5' 7.5" (1.715 m) and weight is 190 lb 8 oz (86.4 kg). Her oral temperature is 97.6 F (36.4 C). Her blood pressure is 134/93 (abnormal) and her pulse is 78. Her respiration is 20 and oxygen saturation is 100%.   General: Alert and oriented, in no acute distress HEENT: Head is normocephalic. Extraocular movements are intact. Oropharynx is clear. Neck: Neck is supple, no palpable cervical or supraclavicular lymphadenopathy. Heart: Regular in rate and rhythm with no murmurs, rubs, or gallops. Chest: Clear to auscultation bilaterally, with no rhonchi, wheezes, or rales. Abdomen: Soft, nontender, nondistended, with no rigidity or guarding. Extremities: No cyanosis or edema. Patient appears to have good range of  motion on the left. Lymphatics: see Neck Exam Skin: No concerning lesions. Musculoskeletal: symmetric strength and muscle tone throughout. Neurologic: Cranial nerves II through XII are grossly intact. No obvious focalities. Speech is fluent. Coordination is intact. Psychiatric: Judgment and insight are intact. Affect is appropriate. Breast: Healing lumpectomy incision in the right breast. Well healing mastectomy incision in the left chest wall area with a drain in place inferiorly. No sign of infection.  ECOG = 1  0 - Asymptomatic (Fully active, able to carry on all predisease activities without restriction)  1 - Symptomatic but completely ambulatory (Restricted in physically strenuous activity but ambulatory and able to carry out work of a light or sedentary nature. For example, light housework, office work)  2 - Symptomatic, <50% in bed during the day (Ambulatory and capable of all self care but unable to carry out any work activities. Up and about more than 50% of waking hours)  3 - Symptomatic, >50% in bed, but not bedbound (Capable of only limited self-care, confined to bed or chair 50% or more of waking hours)  4 - Bedbound (Completely disabled. Cannot carry on any self-care. Totally confined to bed or chair)  5 - Death   Eustace Pen MM, Creech RH, Tormey DC, et al. [phone]). "Toxicity and response criteria of the [facility]". [place] Oncol. 5 (6): 649-55    LABORATORY DATA:  Lab Results  Component Value Date   WBC 4.3 08/20/2015   HGB 13.1 08/20/2015   HCT 41.2 08/20/2015   MCV 99.8 08/20/2015   PLT 232 08/20/2015   Lab Results  Component Value Date   NA 140 08/20/2015   K 3.7 08/20/2015   CL 107 08/20/2015   CO2 24 08/20/2015   Lab Results  Component Value Date   ALT 10 06/21/2015   AST 13 06/21/2015   ALKPHOS 56 06/21/2015   BILITOT <0.30 06/21/2015      RADIOGRAPHY: Mm Breast Surgical Specimen  Result Date: 08/23/2015 CLINICAL DATA:   Biopsy-proven complex sclerosing lesion involving the central right breast. Radioactive seed localization yesterday. Excisional biopsy performed today. EXAM: SPECIMEN RADIOGRAPH OF THE RIGHT BREAST COMPARISON:  Previous exam(s). FINDINGS: Status post excision of the right breast. The radioactive seed and the cylinder shaped tissue marker clip are present, completely intact, and are marked for pathology. This was discussed by telephone with the operating room nurse at the time of interpretation. IMPRESSION: Specimen radiograph of  the right breast. Electronically Signed   By: Evangeline Dakin M.D.   On: 08/23/2015 10:57   Mm Rt Radioactive Seed Loc Mammo Guide  Result Date: 08/22/2015 CLINICAL DATA:  Patient presents for radioactive seed localization of biopsy-proven complex sclerosing lesion over the central right breast. EXAM: MAMMOGRAPHIC GUIDED RADIOACTIVE SEED LOCALIZATION OF THE RIGHT BREAST COMPARISON:  Previous exam(s). FINDINGS: Patient presents for radioactive seed localization prior to surgical excision. I met with the patient and we discussed the procedure of seed localization including benefits and alternatives. We discussed the high likelihood of a successful procedure. We discussed the risks of the procedure including infection, bleeding, tissue injury and further surgery. We discussed the low dose of radioactivity involved in the procedure. Informed, written consent was given. The usual time-out protocol was performed immediately prior to the procedure. Using mammographic guidance, sterile technique, 1% lidocaine and an I-125 radioactive seed, the targeted post biopsy cylindrical metallic clip was localized using a lateral to medial approach. The follow-up mammogram images confirm the seed in the expected location and were marked for Dr. Lucia Gaskins. The radioactive seed lies immediately adjacent the superior/posterior aspect of the clip. Follow-up survey of the patient confirms presence of the  radioactive seed. Order number of I-125 seed:  [id number]. Total activity:  7.591 millicuries  Reference Date: 08/02/2015 The patient tolerated the procedure well and was released from the [facility]. She was given instructions regarding seed removal. IMPRESSION: Radioactive seed localization right breast. No apparent complications. Electronically Signed   By: Marin Olp M.D.   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 Patient has undergone neoadjuvant chemotherapy with mastectomy on the left. Right lumpectomy also performed showing no cancer. The patient is a good candidate for post mastectomy radiation treatment at this time.  PLAN: I spoke to the patient today regarding her diagnosis and options for treatment. We discussed the equivalence in terms of survival and local failure between mastectomy and breast conservation. We discussed the role of radiation in decreasing local failures in patients who undergo mastectomy and have risk factors for recurrence including positive lymph nodes and/or tumors over 5 cm and/or positive margins. We discussed the process of CT simulation and the placement tattoos. We discussed 6 weeks of treatment as an outpatient. We discussed the possibility of asymptomatic lung damage. We discussed the low likelihood of secondary malignancies. We discussed the possible side effects including but not limited to skin redness, fatigue, permanent skin darkening, and chest wall swelling. We discussed increased complications that can occur with reconstruction after radiation. The patient signed a consent form and this was placed in her medical chart.  CT simulation will be scheduled in 2 weeks.  ------------------------------------------------  Jodelle Gross, MD, PhD  This document serves as a record  of services personally performed by Kyung Rudd, MD. It was created on his behalf by Darcus Austin, a trained medical scribe. The creation of this record is based on the scribe's personal observations and the provider's statements to them. This document has been checked and approved by the attending provider.

## 2015-09-02 NOTE — Progress Notes (Signed)
Please see the Nurse Progress Note in the MD Initial Consult Encounter for this patient. 

## 2015-09-12 ENCOUNTER — Telehealth: Payer: Self-pay | Admitting: *Deleted

## 2015-09-12 NOTE — Telephone Encounter (Signed)
  Oncology Nurse Navigator Documentation    Navigator Encounter Type: Telephone (Left vm for return call to give number for [facility] scheduling) (09/12/15 0800) Telephone: Deborah Mcintyre Call (09/12/15 0800)             Barriers/Navigation Needs: Coordination of Care (09/12/15 0800)   Interventions: Coordination of Care (09/12/15 0800)                      Time Spent with Patient: 15 (09/12/15 0800)

## 2015-09-13 ENCOUNTER — Encounter: Payer: Self-pay | Admitting: *Deleted

## 2015-09-13 NOTE — Progress Notes (Signed)
[facility] Psychosocial Distress Screening Clinical Social Work  Clinical Social Work was referred by distress screening protocol.  The patient scored a 8 on the Psychosocial Distress Thermometer which indicates severe distress. Clinical Social Worker phoned pt to assess for distress and other psychosocial needs. CSW left detailed supportive message on her vm. CSW has spoken with pt in the past and reminded pt of support options. CSW educated pt of Support Team's move to ground floor and encouraged pt to return call.   ONCBCN DISTRESS SCREENING 09/02/2015  Screening Type Initial Screening  Distress experienced in past week (1-10) 8  Practical problem type Housing;Transportation  Family Problem type Children  Emotional problem type Depression;Nervousness/Anxiety;Adjusting to illness;Adjusting to appearance changes  Spiritual/Religous concerns type Facing my mortality  Physical Problem type Pain;Loss of appetitie;Changes in urination;Tingling hands/feet;Sexual problems;Skin dry/itchy;Swollen arms/legs  Physician notified of physical symptoms Yes  Referral to clinical social work Yes     Clinical Social Worker follow up needed: Yes.    If yes, follow up plan:  CSW awaits return call and will follow up accordingly.  [name], [place] Worker [place]  [place] Phone: [phone] Fax: [phone]

## 2015-09-16 ENCOUNTER — Telehealth: Payer: Self-pay | Admitting: *Deleted

## 2015-09-16 NOTE — Telephone Encounter (Signed)
  Oncology Nurse Navigator Documentation  Navigator Location: [facility] (09/16/15 1300) Navigator Encounter Type: Telephone (Left vm for pt to schedule SIM) (09/16/15 1300) Telephone: [facility] Call (09/16/15 1300)                                        Time Spent with Patient: 15 (09/16/15 1300)

## 2015-09-18 ENCOUNTER — Telehealth: Payer: Self-pay | Admitting: *Deleted

## 2015-09-18 ENCOUNTER — Other Ambulatory Visit: Payer: Self-pay | Admitting: *Deleted

## 2015-09-18 MED ORDER — OXYCODONE-ACETAMINOPHEN 5-325 MG PO TABS: 1.0000 | ORAL_TABLET | ORAL | 0 refills | Status: DC | PRN

## 2015-09-18 NOTE — Telephone Encounter (Signed)
Pt came to [facility] requesting refill on Percocet. States she is taking 2 tablets every 4 hours for pain as prescribed upon discharged post lumpectomy .

## 2015-09-18 NOTE — Telephone Encounter (Signed)
  Oncology Nurse Navigator Documentation Left vm for pt to return call to schedule for SIM. Contact information provided.

## 2015-09-20 ENCOUNTER — Telehealth: Payer: Self-pay | Admitting: *Deleted

## 2015-09-20 NOTE — Telephone Encounter (Signed)
Left vm for both numbers to please call and schedule SIM. Gave contact information and request a return call.

## 2015-10-03 NOTE — Addendum Note (Signed)
Encounter addended by: Doreen Beam, RN on: 10/03/2015  7:58 AM<BR>    Actions taken: Charge Capture section accepted

## 2015-10-07 ENCOUNTER — Ambulatory Visit: Admission: RE | Admit: 2015-10-07 | Payer: Medicare Other | Source: Ambulatory Visit | Admitting: Radiation Oncology

## 2015-10-11 ENCOUNTER — Telehealth: Payer: Self-pay | Admitting: *Deleted

## 2015-10-11 NOTE — Telephone Encounter (Signed)
Continue to leave multiple msg for pt and husband to get scheduled for SIM to start xrt. Contact information for SIM and myself provided. Request return call.

## 2015-10-21 ENCOUNTER — Other Ambulatory Visit: Payer: Self-pay | Admitting: *Deleted

## 2015-10-21 MED ORDER — OXYCODONE-ACETAMINOPHEN 5-325 MG PO TABS: 1.0000 | ORAL_TABLET | ORAL | 0 refills | Status: DC | PRN

## 2015-10-23 ENCOUNTER — Ambulatory Visit
Admission: RE | Admit: 2015-10-23 | Discharge: 2015-10-23 | Disposition: A | Discharge status: Home / Self Care | Payer: Medicare Other | Source: Ambulatory Visit | Attending: Radiation Oncology | Admitting: Radiation Oncology

## 2015-10-23 DIAGNOSIS — F17210 Nicotine dependence, cigarettes, uncomplicated: Secondary | ICD-10-CM | POA: Diagnosis not present

## 2015-10-23 DIAGNOSIS — C50412 Malignant neoplasm of upper-outer quadrant of left female breast: Secondary | ICD-10-CM | POA: Diagnosis not present

## 2015-10-23 DIAGNOSIS — Z9012 Acquired absence of left breast and nipple: Secondary | ICD-10-CM | POA: Diagnosis not present

## 2015-10-23 DIAGNOSIS — I10 Essential (primary) hypertension: Secondary | ICD-10-CM | POA: Diagnosis not present

## 2015-10-23 DIAGNOSIS — F419 Anxiety disorder, unspecified: Secondary | ICD-10-CM | POA: Diagnosis not present

## 2015-10-23 DIAGNOSIS — Z171 Estrogen receptor negative status [ER-]: Secondary | ICD-10-CM | POA: Diagnosis not present

## 2015-10-23 DIAGNOSIS — J449 Chronic obstructive pulmonary disease, unspecified: Secondary | ICD-10-CM | POA: Diagnosis not present

## 2015-10-23 DIAGNOSIS — F329 Major depressive disorder, single episode, unspecified: Secondary | ICD-10-CM | POA: Diagnosis not present

## 2015-10-23 DIAGNOSIS — Z9221 Personal history of antineoplastic chemotherapy: Secondary | ICD-10-CM | POA: Diagnosis not present

## 2015-10-23 DIAGNOSIS — Z510 Encounter for antineoplastic radiation therapy: Secondary | ICD-10-CM | POA: Diagnosis present

## 2015-10-23 DIAGNOSIS — Z79899 Other long term (current) drug therapy: Secondary | ICD-10-CM | POA: Diagnosis not present

## 2015-10-23 DIAGNOSIS — M1990 Unspecified osteoarthritis, unspecified site: Secondary | ICD-10-CM | POA: Diagnosis not present

## 2015-10-30 ENCOUNTER — Ambulatory Visit
Admission: RE | Admit: 2015-10-30 | Discharge: 2015-10-30 | Disposition: A | Discharge status: Home / Self Care | Payer: Medicare Other | Source: Ambulatory Visit | Attending: Radiation Oncology | Admitting: Radiation Oncology

## 2015-10-30 DIAGNOSIS — Z510 Encounter for antineoplastic radiation therapy: Secondary | ICD-10-CM | POA: Diagnosis not present

## 2015-10-31 ENCOUNTER — Ambulatory Visit
Admission: RE | Admit: 2015-10-31 | Discharge: 2015-10-31 | Disposition: A | Discharge status: Home / Self Care | Payer: Medicare Other | Source: Ambulatory Visit | Attending: Radiation Oncology | Admitting: Radiation Oncology

## 2015-10-31 ENCOUNTER — Ambulatory Visit: Admission: RE | Admit: 2015-10-31 | Payer: Medicare Other | Source: Ambulatory Visit

## 2015-10-31 DIAGNOSIS — Z510 Encounter for antineoplastic radiation therapy: Secondary | ICD-10-CM | POA: Diagnosis not present

## 2015-11-01 ENCOUNTER — Encounter: Payer: Self-pay | Admitting: Radiation Oncology

## 2015-11-01 ENCOUNTER — Ambulatory Visit
Admission: RE | Admit: 2015-11-01 | Discharge: 2015-11-01 | Disposition: A | Discharge status: Home / Self Care | Payer: Medicare Other | Source: Ambulatory Visit | Attending: Radiation Oncology | Admitting: Radiation Oncology

## 2015-11-01 VITALS — BP 147/90 | HR 94 | Temp 98.6°F | Resp 18 | Ht 67.5 in | Wt 196.6 lb

## 2015-11-01 DIAGNOSIS — Z510 Encounter for antineoplastic radiation therapy: Secondary | ICD-10-CM | POA: Diagnosis not present

## 2015-11-01 DIAGNOSIS — C50412 Malignant neoplasm of upper-outer quadrant of left female breast: Secondary | ICD-10-CM

## 2015-11-01 MED ORDER — ALRA NON-METALLIC DEODORANT (RAD-ONC)
1.0000 "application " | Freq: Once | TOPICAL | Status: AC
  Administered 2015-11-01: 1 via TOPICAL

## 2015-11-01 MED ORDER — RADIAPLEXRX EX GEL
Freq: Once | CUTANEOUS | Status: AC
  Administered 2015-11-01: 17:00:00 via TOPICAL

## 2015-11-01 NOTE — Progress Notes (Signed)
Weight and vital signs stable.  Skin to LCW with normal color and intact.  Appetite is good.  Pain  And tenderness to LCW 4/10 taking Oxycodone.  Patient teaching done with instructions on how to use the Radiaplex gel and deodorant.  Documentation in the education area of of the chart. Wt Readings from Last 3 Encounters:  11/01/15 196 lb 9.6 oz (89.2 kg)  09/02/15 190 lb 8 oz (86.4 kg)  08/30/15 190 lb 12.8 oz (86.5 kg)  BP (!) 147/90   Pulse 94   Temp 98.6 F (37 C) (Oral)   Resp 18   Ht 5' 7.5" (1.715 m)   Wt 196 lb 9.6 oz (89.2 kg)   SpO2 99%   BMI 30.34 kg/m

## 2015-11-01 NOTE — Progress Notes (Signed)
Department of Radiation Oncology  Phone:  [phone] Fax:        [phone]  Weekly Treatment Note    Name: Deborah Mcintyre Date: 11/01/2015 MRN: [id number] DOB: [birth year]   Diagnosis:     ICD-9-CM ICD-10-CM   1. Malignant neoplasm of upper-outer quadrant of left female breast, unspecified estrogen receptor status (HCC) 174.4 C50.412 hyaluronate sodium (RADIAPLEXRX) gel     non-metallic deodorant (ALRA) 1 application     Current dose: 3.6 Gy  Current fraction:2   MEDICATIONS: Current Outpatient Prescriptions  Medication Sig Dispense Refill  . albuterol (PROVENTIL HFA;VENTOLIN HFA) 108 (90 Base) MCG/ACT inhaler Inhale 1-2 puffs into the lungs every 6 (six) hours as needed for wheezing or shortness of breath.    [name] albuterol (PROVENTIL) (2.5 MG/3ML) 0.083% nebulizer solution Take 2.5 mg by nebulization every 6 (six) hours as needed for wheezing or shortness of breath.    . ALPRAZolam (XANAX) 0.5 MG tablet Take 1 tablet (0.5 mg total) by mouth 3 (three) times daily as needed for anxiety or sleep. 60 tablet 0  . cycloSPORINE (RESTASIS) 0.05 % ophthalmic emulsion Place 2 drops into both eyes 2 (two) times daily.    . hyaluronate sodium (RADIAPLEXRX) GEL Apply 1 application topically 2 (two) times daily.    [name] ibuprofen (ADVIL,MOTRIN) 200 MG tablet Take 200 mg by mouth every 6 (six) hours as needed. Reported on 03/22/2015    . LORazepam (ATIVAN) 0.5 MG tablet Take 1 tablet (0.5 mg total) by mouth at bedtime. 30 tablet 0  . magic mouthwash w/lidocaine SOLN Take 5 mLs by mouth 2 (two) times daily as needed for mouth pain.    . metoprolol (LOPRESSOR) 50 MG tablet Take 0.5 tablets (25 mg total) by mouth 2 (two) times daily. (Patient taking differently: Take 50 mg by mouth 2 (two) times daily. )    . non-metallic deodorant (ALRA) MISC Apply 1 application topically daily as needed.    . ondansetron (ZOFRAN) 8 MG tablet TAKE 1 TABLET TWICE DAILY AS NEEDED STARTING 3RD DAY AFTER  CHEMOTHERAPY 20 tablet 1  . oxyCODONE-acetaminophen (PERCOCET/ROXICET) 5-325 MG tablet Take 1-2 tablets by mouth every 4 (four) hours as needed for moderate pain. 60 tablet 0  . PARoxetine (PAXIL) 20 MG tablet Take 20 mg by mouth 2 (two) times daily.     . prochlorperazine (COMPAZINE) 10 MG tablet TAKE 1 TABLET (10 MG TOTAL) BY MOUTH EVERY 6 (SIX) HOURS AS NEEDED (NAUSEA OR VOMITING). 30 tablet 1  . tetrahydrozoline 0.05 % ophthalmic solution Place 1 drop into both eyes daily as needed (dry eye).      Current Facility-Administered Medications  Medication Dose Route Frequency Provider Last Rate Last Dose  . non-metallic deodorant (ALRA) 1 application  1 application Topical Once Kyung Rudd, MD         ALLERGIES: Review of patient's allergies indicates no known allergies.   LABORATORY DATA:  Lab Results  Component Value Date   WBC 4.3 08/20/2015   HGB 13.1 08/20/2015   HCT 41.2 08/20/2015   MCV 99.8 08/20/2015   PLT 232 08/20/2015   Lab Results  Component Value Date   NA 140 08/20/2015   K 3.7 08/20/2015   CL 107 08/20/2015   CO2 24 08/20/2015   Lab Results  Component Value Date   ALT 10 06/21/2015   AST 13 06/21/2015   ALKPHOS 56 06/21/2015   BILITOT <0.30 06/21/2015     NARRATIVE: Deborah Mcintyre was seen today for  weekly treatment management. The chart was checked and the patient's films were reviewed.  Skin to LCW normal in color and intact.  Appetite is good. Pain and tenderness to LCW is a 4/10. The patient is taking Oxycodone.  PHYSICAL EXAMINATION: height is 5' 7.5" (1.715 m) and weight is 196 lb 9.6 oz (89.2 kg). Her oral temperature is 98.6 F (37 C). Her blood pressure is 147/90 (abnormal) and her pulse is 94. Her respiration is 18 and oxygen saturation is 99%.   ASSESSMENT: The patient is doing satisfactorily with treatment.  PLAN: We will continue with the patient's radiation treatment as planned.  ------------------------------------------------  Jodelle Gross, MD, PhD  This document serves as a record of services personally performed by Kyung Rudd, MD. It was created on his behalf by Darcus Austin, a trained medical scribe. The creation of this record is based on the scribe's personal observations and the provider's statements to them. This document has been checked and approved by the attending provider.

## 2015-11-01 NOTE — Addendum Note (Signed)
Encounter addended by: Benn Moulder, RN on: 11/01/2015  6:27 PM<BR>    Actions taken: [facility] administration accepted

## 2015-11-04 ENCOUNTER — Ambulatory Visit: Payer: Medicare Other

## 2015-11-05 ENCOUNTER — Ambulatory Visit
Admission: RE | Admit: 2015-11-05 | Discharge: 2015-11-05 | Disposition: A | Discharge status: Home / Self Care | Payer: Medicare Other | Source: Ambulatory Visit | Attending: Radiation Oncology | Admitting: Radiation Oncology

## 2015-11-05 DIAGNOSIS — Z510 Encounter for antineoplastic radiation therapy: Secondary | ICD-10-CM | POA: Diagnosis not present

## 2015-11-06 ENCOUNTER — Ambulatory Visit: Payer: Medicare Other

## 2015-11-07 ENCOUNTER — Telehealth: Payer: Self-pay | Admitting: *Deleted

## 2015-11-07 ENCOUNTER — Ambulatory Visit: Payer: Medicare Other

## 2015-11-07 NOTE — Telephone Encounter (Signed)
  Oncology Nurse Navigator Documentation  Navigator Location: [facility] (11/07/15 1300)   )Navigator Encounter Type: Telephone (11/07/15 1300) Telephone: [facility] Call;Appt Confirmation/Clarification (11/07/15 1300)                   Patient Visit Type: [facility] (11/07/15 1300) Treatment Phase: First Radiation Tx (11/07/15 1300)                            Time Spent with Patient: 15 (11/07/15 1300)

## 2015-11-07 NOTE — Telephone Encounter (Signed)
Called patient home and cell phones, left vm to call us , she has missed past 2 treatments 9:08 AM

## 2015-11-07 NOTE — Telephone Encounter (Signed)
CALLED NO ANSWER AGAIN

## 2015-11-08 ENCOUNTER — Encounter: Payer: Self-pay | Admitting: Radiation Oncology

## 2015-11-08 ENCOUNTER — Ambulatory Visit
Admission: RE | Admit: 2015-11-08 | Discharge: 2015-11-08 | Disposition: A | Discharge status: Home / Self Care | Payer: Medicare Other | Source: Ambulatory Visit | Attending: Radiation Oncology | Admitting: Radiation Oncology

## 2015-11-08 VITALS — BP 129/92 | HR 97 | Temp 98.6°F | Resp 20 | Wt 193.8 lb

## 2015-11-08 DIAGNOSIS — Z510 Encounter for antineoplastic radiation therapy: Secondary | ICD-10-CM | POA: Diagnosis not present

## 2015-11-08 DIAGNOSIS — Z171 Estrogen receptor negative status [ER-]: Principal | ICD-10-CM

## 2015-11-08 DIAGNOSIS — C50412 Malignant neoplasm of upper-outer quadrant of left female breast: Secondary | ICD-10-CM

## 2015-11-08 NOTE — Progress Notes (Signed)
  Radiation Oncology         [phone]) [phone] ________________________________  Name: Deborah Mcintyre MRN: [id number]  Date: 10/23/2015  DOB: August 03, 1955  Optical Surface Tracking Plan:  Since intensity modulated radiotherapy (IMRT) and 3D conformal radiation treatment methods are predicated on accurate and precise positioning for treatment, intrafraction motion monitoring is medically necessary to ensure accurate and safe treatment delivery.  The ability to quantify intrafraction motion without excessive ionizing radiation dose can only be performed with optical surface tracking. Accordingly, surface imaging offers the opportunity to obtain 3D measurements of patient position throughout IMRT and 3D treatments without excessive radiation exposure.  I am ordering optical surface tracking for this patient's upcoming course of radiotherapy. ________________________________  Kyung Rudd, MD 11/08/2015 6:06 PM    Reference:   Ursula Alert, J, et al. Surface imaging-based analysis of intrafraction motion for breast radiotherapy patients.Journal of [place], n. 6, nov. 2014. ISSN [id number].   Available at: <http://www.jacmp.org/index.php/jacmp/article/view/4957>.

## 2015-11-08 NOTE — Progress Notes (Signed)
  Radiation Oncology         [phone]) [phone] ________________________________  Name: ARIANNE KLINGE MRN: [id number]  Date: 10/23/2015  DOB: 11-Sep-1955  Diagnosis DIAGNOSIS:     ICD-9-CM ICD-10-CM   1. Malignant neoplasm of upper-outer quadrant of left breast in female, estrogen receptor negative ([place]) 174.4 C50.412    V86.1 Z17.1      SIMULATION AND TREATMENT PLANNING NOTE  The patient presented for simulation prior to beginning her course of radiation treatment for her diagnosis of left-sided breast cancer. The patient was placed in a supine position on a breast board. A customized vac-lock bag was also constructed and this complex treatment device will be used on a daily basis during her treatment. In this fashion, a CT scan was obtained through the chest area and an isocenter was placed near the chest wall at the upper aspect of the right chest. A breath-hold technique has also been evaluated to determine if this significantly improves the spatial relationship between the target region and the heart. Based on this analysis, a breath-hold technique has not been ordered for the patient's treatment.  The patient will be planned to receive a course of radiation initially to a dose of 50.4 gray. This will consist of a 4 field technique targeting the left chest wall as well as the supraclavicular region. Therefore 2 customized medial and lateral tangent fields have been created targeting the chest wall, and also 2 additional customized fields have been designed to treat the supraclavicular region both with a left supraclavicular field and a left posterior axillary boost field. A forward planning/reduced field technique will also be evaluated to determine if this significantly improves the dose homogeneity of the overall plan. Therefore, additional customized blocks/fields may be necessary.  This initial treatment will be accomplished at 1.8 gray per fraction.   The initial plan will consist of a 3-D  conformal technique. The target volume/scar, heart and lungs have been contoured and dose volume histograms of each of these structures will be evaluated as part of the 3-D conformal treatment planning process.   It is anticipated that the patient will then receive a 10 gray boost to the surgical scar. This will be accomplished at 2 gray per fraction. The final anticipated total dose therefore will correspond to 60.4 gray.    _______________________________   Jodelle Gross, MD, PhD

## 2015-11-08 NOTE — Progress Notes (Signed)
Department of Radiation Oncology  Phone:  [phone] Fax:        [phone]  Weekly Treatment Note    Name: Deborah Mcintyre Date: 11/08/2015 MRN: [id number] DOB: [birth year]   Diagnosis:     ICD-9-CM ICD-10-CM   1. Malignant neoplasm of upper-outer quadrant of left breast in female, estrogen receptor negative ([place]) 174.4 C50.412    V86.1 Z17.1      Current dose: 7.2 Gy  Current fraction: 4   MEDICATIONS: Current Outpatient Prescriptions  Medication Sig Dispense Refill  . albuterol (PROVENTIL HFA;VENTOLIN HFA) 108 (90 Base) MCG/ACT inhaler Inhale 1-2 puffs into the lungs every 6 (six) hours as needed for wheezing or shortness of breath.    [name] albuterol (PROVENTIL) (2.5 MG/3ML) 0.083% nebulizer solution Take 2.5 mg by nebulization every 6 (six) hours as needed for wheezing or shortness of breath.    . ALPRAZolam (XANAX) 0.5 MG tablet Take 1 tablet (0.5 mg total) by mouth 3 (three) times daily as needed for anxiety or sleep. 60 tablet 0  . cycloSPORINE (RESTASIS) 0.05 % ophthalmic emulsion Place 2 drops into both eyes 2 (two) times daily.    . hyaluronate sodium (RADIAPLEXRX) GEL Apply 1 application topically 2 (two) times daily.    [name] ibuprofen (ADVIL,MOTRIN) 200 MG tablet Take 200 mg by mouth every 6 (six) hours as needed. Reported on 03/22/2015    . LORazepam (ATIVAN) 0.5 MG tablet Take 1 tablet (0.5 mg total) by mouth at bedtime. 30 tablet 0  . magic mouthwash w/lidocaine SOLN Take 5 mLs by mouth 2 (two) times daily as needed for mouth pain.    . metoprolol (LOPRESSOR) 50 MG tablet Take 0.5 tablets (25 mg total) by mouth 2 (two) times daily. (Patient taking differently: Take 50 mg by mouth 2 (two) times daily. )    . non-metallic deodorant (ALRA) MISC Apply 1 application topically daily as needed.    . ondansetron (ZOFRAN) 8 MG tablet TAKE 1 TABLET TWICE DAILY AS NEEDED STARTING 3RD DAY AFTER CHEMOTHERAPY 20 tablet 1  . oxyCODONE-acetaminophen (PERCOCET/ROXICET) 5-325 MG  tablet Take 1-2 tablets by mouth every 4 (four) hours as needed for moderate pain. 60 tablet 0  . PARoxetine (PAXIL) 20 MG tablet Take 20 mg by mouth 2 (two) times daily.     . prochlorperazine (COMPAZINE) 10 MG tablet TAKE 1 TABLET (10 MG TOTAL) BY MOUTH EVERY 6 (SIX) HOURS AS NEEDED (NAUSEA OR VOMITING). 30 tablet 1  . tetrahydrozoline 0.05 % ophthalmic solution Place 1 drop into both eyes daily as needed (dry eye).      No current facility-administered medications for this encounter.      ALLERGIES: Review of patient's allergies indicates no known allergies.   LABORATORY DATA:  Lab Results  Component Value Date   WBC 4.3 08/20/2015   HGB 13.1 08/20/2015   HCT 41.2 08/20/2015   MCV 99.8 08/20/2015   PLT 232 08/20/2015   Lab Results  Component Value Date   NA 140 08/20/2015   K 3.7 08/20/2015   CL 107 08/20/2015   CO2 24 08/20/2015   Lab Results  Component Value Date   ALT 10 06/21/2015   AST 13 06/21/2015   ALKPHOS 56 06/21/2015   BILITOT <0.30 06/21/2015     NARRATIVE: Deborah Mcintyre was seen today for weekly treatment management. The chart was checked and the patient's films were reviewed.  Weekly rad txs left chest wall, subclavicular. Mild pink tinge in color, skin intact,using radiaplex bid.  Reports pain when she raises her left arm, but is able to do so. Taking oxycodone prn, good appetite. Fatigued. She has been missing treatments due to transportation issues.  PHYSICAL EXAMINATION: weight is 193 lb 12.8 oz (87.9 kg). Her oral temperature is 98.6 F (37 C). Her blood pressure is 129/92 (abnormal) and her pulse is 97. Her respiration is 20.   ASSESSMENT: The patient is doing satisfactorily with treatment.  PLAN: We will continue with the patient's radiation treatment as planned.  ------------------------------------------------  Jodelle Gross, MD, PhD  This document serves as a record of services personally performed by Kyung Rudd, MD. It was created on  his behalf by Darcus Austin, a trained medical scribe. The creation of this record is based on the scribe's personal observations and the provider's statements to them. This document has been checked and approved by the attending provider.

## 2015-11-08 NOTE — Progress Notes (Signed)
Weekly rad txs  Left chest wall,subclavicular , mild pink tinge in color, skinintact,using radiaplex bid, painful to raise arm but able to do, taking oxycodone prn, appetite good,  4:27 PM Wt Readings from Last 3 Encounters:  11/08/15 193 lb 12.8 oz (87.9 kg)  11/01/15 196 lb 9.6 oz (89.2 kg)  09/02/15 190 lb 8 oz (86.4 kg)   Wt Readings from Last 3 Encounters:  11/08/15 193 lb 12.8 oz (87.9 kg)  11/01/15 196 lb 9.6 oz (89.2 kg)  09/02/15 190 lb 8 oz (86.4 kg)

## 2015-11-11 ENCOUNTER — Ambulatory Visit
Admission: RE | Admit: 2015-11-11 | Discharge: 2015-11-11 | Disposition: A | Discharge status: Home / Self Care | Payer: Medicare Other | Source: Ambulatory Visit | Attending: Radiation Oncology | Admitting: Radiation Oncology

## 2015-11-11 DIAGNOSIS — Z510 Encounter for antineoplastic radiation therapy: Secondary | ICD-10-CM | POA: Diagnosis not present

## 2015-11-12 ENCOUNTER — Ambulatory Visit
Admission: RE | Admit: 2015-11-12 | Discharge: 2015-11-12 | Disposition: A | Discharge status: Home / Self Care | Payer: Medicare Other | Source: Ambulatory Visit | Attending: Radiation Oncology | Admitting: Radiation Oncology

## 2015-11-12 DIAGNOSIS — Z510 Encounter for antineoplastic radiation therapy: Secondary | ICD-10-CM | POA: Diagnosis not present

## 2015-11-13 ENCOUNTER — Ambulatory Visit
Admission: RE | Admit: 2015-11-13 | Discharge: 2015-11-13 | Disposition: A | Discharge status: Home / Self Care | Payer: Medicare Other | Source: Ambulatory Visit | Attending: Radiation Oncology | Admitting: Radiation Oncology

## 2015-11-13 DIAGNOSIS — Z510 Encounter for antineoplastic radiation therapy: Secondary | ICD-10-CM | POA: Diagnosis not present

## 2015-11-14 ENCOUNTER — Ambulatory Visit
Admission: RE | Admit: 2015-11-14 | Discharge: 2015-11-14 | Disposition: A | Discharge status: Home / Self Care | Payer: Medicare Other | Source: Ambulatory Visit | Attending: Radiation Oncology | Admitting: Radiation Oncology

## 2015-11-14 DIAGNOSIS — Z510 Encounter for antineoplastic radiation therapy: Secondary | ICD-10-CM | POA: Diagnosis not present

## 2015-11-15 ENCOUNTER — Encounter: Payer: Self-pay | Admitting: Radiation Oncology

## 2015-11-15 ENCOUNTER — Inpatient Hospital Stay
Admission: RE | Admit: 2015-11-15 | Discharge: 2015-11-15 | Disposition: A | Discharge status: Home / Self Care | Payer: Self-pay | Source: Ambulatory Visit | Attending: Radiation Oncology | Admitting: Radiation Oncology

## 2015-11-15 ENCOUNTER — Ambulatory Visit
Admission: RE | Admit: 2015-11-15 | Discharge: 2015-11-15 | Disposition: A | Discharge status: Home / Self Care | Payer: Medicare Other | Source: Ambulatory Visit | Attending: Radiation Oncology | Admitting: Radiation Oncology

## 2015-11-15 VITALS — BP 147/102 | HR 96 | Temp 98.3°F | Resp 20 | Wt 195.2 lb

## 2015-11-15 DIAGNOSIS — Z510 Encounter for antineoplastic radiation therapy: Secondary | ICD-10-CM | POA: Diagnosis not present

## 2015-11-15 DIAGNOSIS — Z171 Estrogen receptor negative status [ER-]: Principal | ICD-10-CM

## 2015-11-15 DIAGNOSIS — C50412 Malignant neoplasm of upper-outer quadrant of left female breast: Secondary | ICD-10-CM

## 2015-11-15 NOTE — Progress Notes (Signed)
+   Department of Radiation Oncology  Phone:  [phone] Fax:        [phone]  Weekly Treatment Note    Name: Deborah Mcintyre Date: 11/15/2015 MRN: [id number] DOB: July 04, 1955   Diagnosis:     ICD-9-CM ICD-10-CM   1. Malignant neoplasm of upper-outer quadrant of left breast in female, estrogen receptor negative ([place]) 174.4 C50.412    V86.1 Z17.1      Current dose: 16.2 Gy  Current fraction:9   MEDICATIONS: Current Outpatient Prescriptions  Medication Sig Dispense Refill  . albuterol (PROVENTIL HFA;VENTOLIN HFA) 108 (90 Base) MCG/ACT inhaler Inhale 1-2 puffs into the lungs every 6 (six) hours as needed for wheezing or shortness of breath.    [name] albuterol (PROVENTIL) (2.5 MG/3ML) 0.083% nebulizer solution Take 2.5 mg by nebulization every 6 (six) hours as needed for wheezing or shortness of breath.    . ALPRAZolam (XANAX) 0.5 MG tablet Take 1 tablet (0.5 mg total) by mouth 3 (three) times daily as needed for anxiety or sleep. 60 tablet 0  . cycloSPORINE (RESTASIS) 0.05 % ophthalmic emulsion Place 2 drops into both eyes 2 (two) times daily.    . hyaluronate sodium (RADIAPLEXRX) GEL Apply 1 application topically 2 (two) times daily.    [name] ibuprofen (ADVIL,MOTRIN) 200 MG tablet Take 200 mg by mouth every 6 (six) hours as needed. Reported on 03/22/2015    . LORazepam (ATIVAN) 0.5 MG tablet Take 1 tablet (0.5 mg total) by mouth at bedtime. 30 tablet 0  . magic mouthwash w/lidocaine SOLN Take 5 mLs by mouth 2 (two) times daily as needed for mouth pain.    . metoprolol (LOPRESSOR) 50 MG tablet Take 0.5 tablets (25 mg total) by mouth 2 (two) times daily. (Patient taking differently: Take 50 mg by mouth 2 (two) times daily. )    . non-metallic deodorant (ALRA) MISC Apply 1 application topically daily as needed.    . ondansetron (ZOFRAN) 8 MG tablet TAKE 1 TABLET TWICE DAILY AS NEEDED STARTING 3RD DAY AFTER CHEMOTHERAPY 20 tablet 1  . PARoxetine (PAXIL) 20 MG tablet Take 20 mg by  mouth 2 (two) times daily.     . prochlorperazine (COMPAZINE) 10 MG tablet TAKE 1 TABLET (10 MG TOTAL) BY MOUTH EVERY 6 (SIX) HOURS AS NEEDED (NAUSEA OR VOMITING). 30 tablet 1  . tetrahydrozoline 0.05 % ophthalmic solution Place 1 drop into both eyes daily as needed (dry eye).     [name] oxyCODONE-acetaminophen (PERCOCET/ROXICET) 5-325 MG tablet Take 1-2 tablets by mouth every 4 (four) hours as needed for moderate pain. (Patient not taking: Reported on 11/15/2015) 60 tablet 0   No current facility-administered medications for this encounter.      ALLERGIES: Patient has no known allergies.   LABORATORY DATA:  Lab Results  Component Value Date   WBC 4.3 08/20/2015   HGB 13.1 08/20/2015   HCT 41.2 08/20/2015   MCV 99.8 08/20/2015   PLT 232 08/20/2015   Lab Results  Component Value Date   NA 140 08/20/2015   K 3.7 08/20/2015   CL 107 08/20/2015   CO2 24 08/20/2015   Lab Results  Component Value Date   ALT 10 06/21/2015   AST 13 06/21/2015   ALKPHOS 56 06/21/2015   BILITOT <0.30 06/21/2015     NARRATIVE: Deborah Mcintyre was seen today for weekly treatment management. The chart was checked and the patient's films were reviewed.  Weekly rad txs  Left breast chest wall,    9/33  Completed, mild hyperpigmentation  Only,skin intact, using radiaplex bid, has congestive cough,smokes 8-9 cigarettes daily,trying to wean off, appetite good, fatigued, occasional pian in left chest wall 6:06 PM BP (!) 147/102 (BP Location: Right Arm, Patient Position: Sitting, Cuff Size: Normal)   Pulse 96   Temp 98.3 F (36.8 C) (Oral)   Resp 20   Wt 195 lb 3.2 oz (88.5 kg)   BMI 30.12 kg/m   Wt Readings from Last 3 Encounters:  11/15/15 195 lb 3.2 oz (88.5 kg)  11/08/15 193 lb 12.8 oz (87.9 kg)  11/01/15 196 lb 9.6 oz (89.2 kg)     PHYSICAL EXAMINATION: weight is 195 lb 3.2 oz (88.5 kg). Her oral temperature is 98.3 F (36.8 C). Her blood pressure is 147/102 (abnormal) and her pulse is 96. Her  respiration is 20.        ASSESSMENT: The patient is doing satisfactorily with treatment.  PLAN: We will continue with the patient's radiation treatment as planned.

## 2015-11-15 NOTE — Progress Notes (Signed)
Weekly rad txs  Left breast chest wall,    9/33   Completed, mild hyperpigmentation  Only,skin intact, using radiaplex bid, has congestive cough,smokes 8-9 cigarettes daily,trying to wean off, appetite good, fatigued, occasional pian in left chest wall 4:33 PM BP (!) 147/102 (BP Location: Right Arm, Patient Position: Sitting, Cuff Size: Normal)   Pulse 96   Temp 98.3 F (36.8 C) (Oral)   Resp 20   Wt 195 lb 3.2 oz (88.5 kg)   BMI 30.12 kg/m   Wt Readings from Last 3 Encounters:  11/15/15 195 lb 3.2 oz (88.5 kg)  11/08/15 193 lb 12.8 oz (87.9 kg)  11/01/15 196 lb 9.6 oz (89.2 kg)

## 2015-11-18 ENCOUNTER — Ambulatory Visit
Admission: RE | Admit: 2015-11-18 | Discharge: 2015-11-18 | Disposition: A | Discharge status: Home / Self Care | Payer: Medicare Other | Source: Ambulatory Visit | Attending: Radiation Oncology | Admitting: Radiation Oncology

## 2015-11-18 DIAGNOSIS — Z510 Encounter for antineoplastic radiation therapy: Secondary | ICD-10-CM | POA: Diagnosis not present

## 2015-11-19 ENCOUNTER — Ambulatory Visit
Admission: RE | Admit: 2015-11-19 | Discharge: 2015-11-19 | Disposition: A | Discharge status: Home / Self Care | Payer: Medicare Other | Source: Ambulatory Visit | Attending: Radiation Oncology | Admitting: Radiation Oncology

## 2015-11-19 DIAGNOSIS — Z510 Encounter for antineoplastic radiation therapy: Secondary | ICD-10-CM | POA: Diagnosis not present

## 2015-11-20 ENCOUNTER — Ambulatory Visit
Admission: RE | Admit: 2015-11-20 | Discharge: 2015-11-20 | Disposition: A | Discharge status: Home / Self Care | Payer: Medicare Other | Source: Ambulatory Visit | Attending: Radiation Oncology | Admitting: Radiation Oncology

## 2015-11-20 DIAGNOSIS — Z510 Encounter for antineoplastic radiation therapy: Secondary | ICD-10-CM | POA: Diagnosis not present

## 2015-11-21 ENCOUNTER — Ambulatory Visit
Admission: RE | Admit: 2015-11-21 | Discharge: 2015-11-21 | Disposition: A | Discharge status: Home / Self Care | Payer: Medicare Other | Source: Ambulatory Visit | Attending: Radiation Oncology | Admitting: Radiation Oncology

## 2015-11-21 DIAGNOSIS — Z510 Encounter for antineoplastic radiation therapy: Secondary | ICD-10-CM | POA: Diagnosis not present

## 2015-11-22 ENCOUNTER — Ambulatory Visit
Admission: RE | Admit: 2015-11-22 | Discharge: 2015-11-22 | Disposition: A | Discharge status: Home / Self Care | Payer: Medicare Other | Source: Ambulatory Visit | Attending: Radiation Oncology | Admitting: Radiation Oncology

## 2015-11-22 DIAGNOSIS — Z510 Encounter for antineoplastic radiation therapy: Secondary | ICD-10-CM | POA: Diagnosis not present

## 2015-11-22 DIAGNOSIS — C50412 Malignant neoplasm of upper-outer quadrant of left female breast: Secondary | ICD-10-CM

## 2015-11-22 DIAGNOSIS — Z171 Estrogen receptor negative status [ER-]: Principal | ICD-10-CM

## 2015-11-22 NOTE — Progress Notes (Signed)
Patient seen in the back by MD not sent to nursing for assessment 3:36 PM

## 2015-11-24 ENCOUNTER — Ambulatory Visit: Payer: Medicare Other

## 2015-11-24 NOTE — Progress Notes (Signed)
Department of Radiation Oncology  Phone:  [phone] Fax:        [phone]  Weekly Treatment Note    Name: Deborah Mcintyre Date: 11/24/2015 MRN: [id number] DOB: October 01, 1955   Diagnosis:     ICD-9-CM ICD-10-CM   1. Malignant neoplasm of upper-outer quadrant of left breast in female, estrogen receptor negative ([place]) 174.4 C50.412    V86.1 Z17.1      Current dose: 25.2 Gy  Current fraction: 14   MEDICATIONS: Current Outpatient Prescriptions  Medication Sig Dispense Refill  . albuterol (PROVENTIL HFA;VENTOLIN HFA) 108 (90 Base) MCG/ACT inhaler Inhale 1-2 puffs into the lungs every 6 (six) hours as needed for wheezing or shortness of breath.    [name] albuterol (PROVENTIL) (2.5 MG/3ML) 0.083% nebulizer solution Take 2.5 mg by nebulization every 6 (six) hours as needed for wheezing or shortness of breath.    . ALPRAZolam (XANAX) 0.5 MG tablet Take 1 tablet (0.5 mg total) by mouth 3 (three) times daily as needed for anxiety or sleep. 60 tablet 0  . cycloSPORINE (RESTASIS) 0.05 % ophthalmic emulsion Place 2 drops into both eyes 2 (two) times daily.    . hyaluronate sodium (RADIAPLEXRX) GEL Apply 1 application topically 2 (two) times daily.    [name] ibuprofen (ADVIL,MOTRIN) 200 MG tablet Take 200 mg by mouth every 6 (six) hours as needed. Reported on 03/22/2015    . LORazepam (ATIVAN) 0.5 MG tablet Take 1 tablet (0.5 mg total) by mouth at bedtime. 30 tablet 0  . magic mouthwash w/lidocaine SOLN Take 5 mLs by mouth 2 (two) times daily as needed for mouth pain.    . metoprolol (LOPRESSOR) 50 MG tablet Take 0.5 tablets (25 mg total) by mouth 2 (two) times daily. (Patient taking differently: Take 50 mg by mouth 2 (two) times daily. )    . non-metallic deodorant (ALRA) MISC Apply 1 application topically daily as needed.    . ondansetron (ZOFRAN) 8 MG tablet TAKE 1 TABLET TWICE DAILY AS NEEDED STARTING 3RD DAY AFTER CHEMOTHERAPY 20 tablet 1  . oxyCODONE-acetaminophen (PERCOCET/ROXICET) 5-325  MG tablet Take 1-2 tablets by mouth every 4 (four) hours as needed for moderate pain. (Patient not taking: Reported on 11/15/2015) 60 tablet 0  . PARoxetine (PAXIL) 20 MG tablet Take 20 mg by mouth 2 (two) times daily.     . prochlorperazine (COMPAZINE) 10 MG tablet TAKE 1 TABLET (10 MG TOTAL) BY MOUTH EVERY 6 (SIX) HOURS AS NEEDED (NAUSEA OR VOMITING). 30 tablet 1  . tetrahydrozoline 0.05 % ophthalmic solution Place 1 drop into both eyes daily as needed (dry eye).      No current facility-administered medications for this encounter.      ALLERGIES: Patient has no known allergies.   LABORATORY DATA:  Lab Results  Component Value Date   WBC 4.3 08/20/2015   HGB 13.1 08/20/2015   HCT 41.2 08/20/2015   MCV 99.8 08/20/2015   PLT 232 08/20/2015   Lab Results  Component Value Date   NA 140 08/20/2015   K 3.7 08/20/2015   CL 107 08/20/2015   CO2 24 08/20/2015   Lab Results  Component Value Date   ALT 10 06/21/2015   AST 13 06/21/2015   ALKPHOS 56 06/21/2015   BILITOT <0.30 06/21/2015     NARRATIVE: Deborah Mcintyre was seen today for weekly treatment management. The chart was checked and the patient's films were reviewed.  The patient states that she is doing well. No substantial difficulties over the  last week. She continues to use skin cream on a daily basis.  PHYSICAL EXAMINATION: vitals were not taken for this visit.     Alert, no acute distress, no moist desquamation in the treatment area  ASSESSMENT: The patient is doing satisfactorily with treatment.  PLAN: We will continue with the patient's radiation treatment as planned.

## 2015-11-25 ENCOUNTER — Ambulatory Visit
Admission: RE | Admit: 2015-11-25 | Discharge: 2015-11-25 | Disposition: A | Discharge status: Home / Self Care | Payer: Medicare Other | Source: Ambulatory Visit | Attending: Radiation Oncology | Admitting: Radiation Oncology

## 2015-11-25 DIAGNOSIS — Z510 Encounter for antineoplastic radiation therapy: Secondary | ICD-10-CM | POA: Diagnosis not present

## 2015-11-26 ENCOUNTER — Ambulatory Visit
Admission: RE | Admit: 2015-11-26 | Discharge: 2015-11-26 | Disposition: A | Discharge status: Home / Self Care | Payer: Medicare Other | Source: Ambulatory Visit | Attending: Radiation Oncology | Admitting: Radiation Oncology

## 2015-11-26 DIAGNOSIS — Z510 Encounter for antineoplastic radiation therapy: Secondary | ICD-10-CM | POA: Diagnosis not present

## 2015-11-27 ENCOUNTER — Ambulatory Visit
Admission: RE | Admit: 2015-11-27 | Discharge: 2015-11-27 | Disposition: A | Discharge status: Home / Self Care | Payer: Medicare Other | Source: Ambulatory Visit | Attending: Radiation Oncology | Admitting: Radiation Oncology

## 2015-11-27 DIAGNOSIS — Z510 Encounter for antineoplastic radiation therapy: Secondary | ICD-10-CM | POA: Diagnosis not present

## 2015-11-29 ENCOUNTER — Ambulatory Visit: Payer: Medicare Other

## 2015-12-02 ENCOUNTER — Ambulatory Visit: Payer: Medicare Other

## 2015-12-02 ENCOUNTER — Ambulatory Visit: Payer: Medicare Other | Admitting: Radiation Oncology

## 2015-12-02 ENCOUNTER — Ambulatory Visit: Admission: RE | Admit: 2015-12-02 | Payer: Medicare Other | Source: Ambulatory Visit | Admitting: Radiation Oncology

## 2015-12-02 NOTE — Assessment & Plan Note (Signed)
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  Treatment summary: 1. Neoadjuvant chemotherapy with dose dense Adriamycin and Cytoxan 4 followed by Taxol and carboplatin 12 weekly from 01/18/2015 to 06/21/2015 2. left Mastectomy With axillary lymph node dissection 08/23/2015: Invasive ductal carcinoma with calcifications grade 3, 1.3 cm, image, margins negative, 1/6 lymph nodes positive with extracapsular extension, ER 0%, 0%, T1cN1a stage IIA 3. Plan: Adjuvant XRT 10/31/15 - 12/02/15  Plan:  1. Adj Capecitabine for 6 months 2. SWOG 1418 trial with Pembro vs Placebo

## 2015-12-03 ENCOUNTER — Ambulatory Visit
Admission: RE | Admit: 2015-12-03 | Discharge: 2015-12-03 | Disposition: A | Discharge status: Home / Self Care | Payer: Medicare Other | Source: Ambulatory Visit | Attending: Radiation Oncology | Admitting: Radiation Oncology

## 2015-12-03 ENCOUNTER — Ambulatory Visit (HOSPITAL_BASED_OUTPATIENT_CLINIC_OR_DEPARTMENT_OTHER): Payer: Medicare Other | Admitting: Hematology and Oncology

## 2015-12-03 ENCOUNTER — Encounter: Payer: Self-pay | Admitting: Hematology and Oncology

## 2015-12-03 DIAGNOSIS — C50412 Malignant neoplasm of upper-outer quadrant of left female breast: Secondary | ICD-10-CM | POA: Diagnosis present

## 2015-12-03 DIAGNOSIS — C773 Secondary and unspecified malignant neoplasm of axilla and upper limb lymph nodes: Secondary | ICD-10-CM | POA: Diagnosis not present

## 2015-12-03 DIAGNOSIS — Z510 Encounter for antineoplastic radiation therapy: Secondary | ICD-10-CM | POA: Diagnosis present

## 2015-12-03 DIAGNOSIS — Z171 Estrogen receptor negative status [ER-]: Secondary | ICD-10-CM | POA: Diagnosis not present

## 2015-12-03 DIAGNOSIS — L598 Other specified disorders of the skin and subcutaneous tissue related to radiation: Secondary | ICD-10-CM

## 2015-12-03 DIAGNOSIS — R5383 Other fatigue: Secondary | ICD-10-CM | POA: Diagnosis not present

## 2015-12-03 DIAGNOSIS — Z79899 Other long term (current) drug therapy: Secondary | ICD-10-CM | POA: Insufficient documentation

## 2015-12-03 NOTE — Progress Notes (Signed)
Patient Care Team: [name], MD as PCP - General (Internal Medicine) Nicholas Lose, MD as Consulting Physician (Hematology and Oncology) Alphonsa Overall, MD as Consulting Physician (General Surgery)  DIAGNOSIS:  Encounter Diagnosis  Name Primary?  . 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      12/26/2014 Initial Diagnosis    Left breast biopsy: Invasive ductal carcinoma with lymphovascular invasion, 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, grade 3, ER/PR negative, Ki-67 80% HER-2 Neg      01/18/2015 - 06/21/2015 Neo-Adjuvant Chemotherapy    Dose dense Adriamycin and Cytoxan 4 followed by Taxol and carboplatin [id number]      07/03/2015 Breast MRI    Improvement in multiple areas of enhancement largest area 3.4 cm other suspicious nodules anterior to the mass also improved, left axillary internal mammary lymph nodes improved      08/23/2015 Surgery    Left mastectomy with axillary lymph node dissection: Invasive ductal carcinoma with calcifications grade 3, 1.3 cm, image, margins negative, 1/6 lymph nodes positive with extracapsular extension, ER 0%, 0%, T1cN1a stage II a       10/31/2015 - 12/02/2015 Radiation Therapy    Adj XRT       CHIEF COMPLIANT: Follow-up after radiation therapy  INTERVAL HISTORY: Deborah Mcintyre is a 60 year old with above-mentioned history of breast cancer treated with neoadjuvant chemotherapy followed by left mastectomy. 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 She is currently undergoing adjuvant  radiation therapy and is here today to discuss further treatment plan. . She has radiation dermatitis and fatigue but otherwise doing quite well. She is complaining of pain in the left breast. After the initial surgery she had a seroma which was drained by Dr. Dalbert Batman.  REVIEW OF SYSTEMS:   Constitutional: Denies fevers, chills or abnormal weight loss Eyes: Denies blurriness of vision Ears, nose, mouth, throat, and face: Denies mucositis or sore throat Respiratory: Denies cough, dyspnea or wheezes Cardiovascular: Denies palpitation, chest discomfort Gastrointestinal:  Denies nausea, heartburn or change in bowel habits Skin: Denies abnormal skin rashes Lymphatics: Denies new lymphadenopathy or easy bruising Neurological:Denies numbness, tingling or new weaknesses Behavioral/Psych: Mood is stable, no new changes  Extremities: No lower extremity edema Breast: Pain in the left breast and radiation dermatitis All other systems were reviewed with the patient and are negative.  I have reviewed the past medical history, past surgical history, social history and family history with the patient and they are unchanged from previous note.  ALLERGIES:  has No Known Allergies.  MEDICATIONS:  Current Outpatient Prescriptions  Medication Sig Dispense Refill  . albuterol (PROVENTIL HFA;VENTOLIN HFA) 108 (90 Base) MCG/ACT inhaler Inhale 1-2 puffs into the lungs every 6 (six) hours as needed for wheezing or shortness of breath.    [name] albuterol (PROVENTIL) (2.5 MG/3ML) 0.083% nebulizer solution Take 2.5 mg by nebulization every 6 (six) hours as needed for wheezing or shortness of breath.    . ALPRAZolam (XANAX) 0.5 MG tablet Take 1 tablet (0.5 mg total) by mouth 3 (three) times daily as needed for  anxiety or sleep. 60 tablet 0  . cycloSPORINE (RESTASIS) 0.05 % ophthalmic emulsion Place 2 drops into both eyes 2 (two) times daily.    . hyaluronate sodium (RADIAPLEXRX) GEL Apply 1 application topically 2 (two)  times daily.    [name] ibuprofen (ADVIL,MOTRIN) 200 MG tablet Take 200 mg by mouth every 6 (six) hours as needed. Reported on 03/22/2015    . LORazepam (ATIVAN) 0.5 MG tablet Take 1 tablet (0.5 mg total) by mouth at bedtime. 30 tablet 0  . magic mouthwash w/lidocaine SOLN Take 5 mLs by mouth 2 (two) times daily as needed for mouth pain.    . metoprolol (LOPRESSOR) 50 MG tablet Take 0.5 tablets (25 mg total) by mouth 2 (two) times daily. (Patient taking differently: Take 50 mg by mouth 2 (two) times daily. )    . non-metallic deodorant (ALRA) MISC Apply 1 application topically daily as needed.    . ondansetron (ZOFRAN) 8 MG tablet TAKE 1 TABLET TWICE DAILY AS NEEDED STARTING 3RD DAY AFTER CHEMOTHERAPY 20 tablet 1  . oxyCODONE-acetaminophen (PERCOCET/ROXICET) 5-325 MG tablet Take 1 tablet by mouth every 4 (four) hours as needed for moderate pain or severe pain (1 tab every 4-6 hour prn pain). 1 tab every 4-6 hours prn pain, dispense 60 tabs no refill    . PARoxetine (PAXIL) 20 MG tablet Take 20 mg by mouth 2 (two) times daily.     . prochlorperazine (COMPAZINE) 10 MG tablet TAKE 1 TABLET (10 MG TOTAL) BY MOUTH EVERY 6 (SIX) HOURS AS NEEDED (NAUSEA OR VOMITING). 30 tablet 1  . tetrahydrozoline 0.05 % ophthalmic solution Place 1 drop into both eyes daily as needed (dry eye).      No current facility-administered medications for this visit.     PHYSICAL EXAMINATION: ECOG PERFORMANCE STATUS: 1 - Symptomatic but completely ambulatory  Vitals:   12/03/15 1409  BP: 128/74  Pulse: (!) 102  Resp: 18  Temp: 98.1 F (36.7 C)   Filed Weights   12/03/15 1409  Weight: 203 lb 1.6 oz (92.1 kg)    GENERAL:alert, no distress and comfortable SKIN: skin color, texture, turgor are normal, no rashes or significant lesions EYES: normal, Conjunctiva are pink and non-injected, sclera clear OROPHARYNX:no exudate, no erythema and lips, buccal mucosa, and tongue normal  NECK: supple, thyroid normal size, non-tender,  without nodularity LYMPH:  no palpable lymphadenopathy in the cervical, axillary or inguinal LUNGS: clear to auscultation and percussion with normal breathing effort HEART: regular rate & rhythm and no murmurs and no lower extremity edema ABDOMEN:abdomen soft, non-tender and normal bowel sounds MUSCULOSKELETAL:no cyanosis of digits and no clubbing  NEURO: alert & oriented x 3 with fluent speech, no focal motor/sensory deficits EXTREMITIES: No lower extremity edema   LABORATORY DATA:  I have reviewed the data as listed   Chemistry      Component Value Date/Time   NA 140 08/20/2015 1356   NA 140 06/21/2015 1142   K 3.7 08/20/2015 1356   K 4.2 06/21/2015 1142   CL 107 08/20/2015 1356   CO2 24 08/20/2015 1356   CO2 27 06/21/2015 1142   BUN 13 08/20/2015 1356   BUN 13.2 06/21/2015 1142   CREATININE 0.74 08/20/2015 1356   CREATININE 0.8 06/21/2015 1142      Component Value Date/Time   CALCIUM 9.3 08/20/2015 1356   CALCIUM 9.2 06/21/2015 1142   ALKPHOS 56 06/21/2015 1142   AST 13 06/21/2015 1142   ALT 10 06/21/2015 1142   BILITOT <  0.30 06/21/2015 1142       Lab Results  Component Value Date   WBC 4.3 08/20/2015   HGB 13.1 08/20/2015   HCT 41.2 08/20/2015   MCV 99.8 08/20/2015   PLT 232 08/20/2015   NEUTROABS 2.2 06/21/2015    ASSESSMENT & PLAN:  Breast cancer of upper-outer quadrant of left female breast ([place]) Left breast biopsy12/21/16: inflammatory breast cancerInvasive ductal carcinoma with lymphovascular invasion, 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  Treatment summary: 1. Neoadjuvant chemotherapy with dose dense Adriamycin and Cytoxan 4 followed by Taxol and carboplatin 12 weekly from 01/18/2015 to 06/21/2015 2. left Mastectomy With axillary lymph  node dissection 08/23/2015: Invasive ductal carcinoma with calcifications grade 3, 1.3 cm, image, margins negative, 1/6 lymph nodes positive with extracapsular extension, ER 0%, 0%, T1cN1a stage IIA 3. Plan: Adjuvant XRT 10/31/15 -   Plan:  1. Adj Capecitabine for 6 months. Patient will start to Xeloda after the holidays in January. 2. SWOG 1418 trial with Pembro vs Placebo  CREATE-X study enrolled 910 patient's with triple negative breast cancer and residual disease after neo-adjuvant therapy. 455 patients assigned to 1250 mg/m times daily 2 weeks on 1 week off  up to 8 cycles, at 5 years PFS 74.1% with Xeloda compared to 67.7% in control arm, 30% reduction in risk, overall survival 89.2% versus 83.9%, hand-foot syndrome was seen in 72.3% with Xeloda grade 3 in 10.9%  Return to clinic in mid January to start Xeloda Breast pain: I sent a message to Dr. Dalbert Batman to see her back if she has developed another seroma.  No orders of the defined types were placed in this encounter.  The patient has a good understanding of the overall plan. she agrees with it. she will call with any problems that may develop before the next visit here.   Rulon Eisenmenger, MD 12/03/15

## 2015-12-04 ENCOUNTER — Telehealth: Payer: Self-pay | Admitting: Hematology and Oncology

## 2015-12-04 ENCOUNTER — Ambulatory Visit: Payer: Medicare Other

## 2015-12-04 NOTE — Telephone Encounter (Signed)
Appointment rescheduled per patient request. Patient has a cold.

## 2015-12-05 ENCOUNTER — Ambulatory Visit: Payer: Medicare Other

## 2015-12-06 ENCOUNTER — Ambulatory Visit: Payer: Medicare Other

## 2015-12-09 ENCOUNTER — Ambulatory Visit: Admission: RE | Admit: 2015-12-09 | Payer: Medicare Other | Source: Ambulatory Visit | Admitting: Radiation Oncology

## 2015-12-09 ENCOUNTER — Ambulatory Visit
Admission: RE | Admit: 2015-12-09 | Discharge: 2015-12-09 | Disposition: A | Discharge status: Home / Self Care | Payer: Medicare Other | Source: Ambulatory Visit | Attending: Radiation Oncology | Admitting: Radiation Oncology

## 2015-12-09 ENCOUNTER — Ambulatory Visit: Payer: Medicare Other

## 2015-12-09 DIAGNOSIS — Z510 Encounter for antineoplastic radiation therapy: Secondary | ICD-10-CM | POA: Diagnosis not present

## 2015-12-10 ENCOUNTER — Ambulatory Visit: Payer: Medicare Other

## 2015-12-10 ENCOUNTER — Ambulatory Visit
Admission: RE | Admit: 2015-12-10 | Discharge: 2015-12-10 | Disposition: A | Discharge status: Home / Self Care | Payer: Medicare Other | Source: Ambulatory Visit | Attending: Radiation Oncology | Admitting: Radiation Oncology

## 2015-12-10 DIAGNOSIS — Z510 Encounter for antineoplastic radiation therapy: Secondary | ICD-10-CM | POA: Diagnosis not present

## 2015-12-11 ENCOUNTER — Encounter: Payer: Self-pay | Admitting: Radiation Oncology

## 2015-12-11 ENCOUNTER — Ambulatory Visit: Payer: Medicare Other

## 2015-12-11 ENCOUNTER — Ambulatory Visit
Admission: RE | Admit: 2015-12-11 | Discharge: 2015-12-11 | Disposition: A | Discharge status: Home / Self Care | Payer: Medicare Other | Source: Ambulatory Visit | Attending: Radiation Oncology | Admitting: Radiation Oncology

## 2015-12-11 DIAGNOSIS — Z510 Encounter for antineoplastic radiation therapy: Secondary | ICD-10-CM | POA: Diagnosis not present

## 2015-12-12 ENCOUNTER — Ambulatory Visit: Payer: Medicare Other

## 2015-12-12 ENCOUNTER — Ambulatory Visit: Admission: RE | Admit: 2015-12-12 | Payer: Medicare Other | Source: Ambulatory Visit | Admitting: Radiation Oncology

## 2015-12-13 ENCOUNTER — Ambulatory Visit
Admission: RE | Admit: 2015-12-13 | Discharge: 2015-12-13 | Disposition: A | Discharge status: Home / Self Care | Payer: Medicare Other | Source: Ambulatory Visit | Attending: Radiation Oncology | Admitting: Radiation Oncology

## 2015-12-13 ENCOUNTER — Encounter: Payer: Self-pay | Admitting: Radiation Oncology

## 2015-12-13 ENCOUNTER — Ambulatory Visit: Payer: Medicare Other

## 2015-12-13 VITALS — BP 149/101 | HR 99 | Temp 98.3°F | Resp 20 | Wt 200.0 lb

## 2015-12-13 DIAGNOSIS — Z510 Encounter for antineoplastic radiation therapy: Secondary | ICD-10-CM | POA: Diagnosis not present

## 2015-12-13 DIAGNOSIS — Z171 Estrogen receptor negative status [ER-]: Principal | ICD-10-CM

## 2015-12-13 DIAGNOSIS — C50412 Malignant neoplasm of upper-outer quadrant of left female breast: Secondary | ICD-10-CM

## 2015-12-13 NOTE — Progress Notes (Signed)
Department of Radiation Oncology  Phone:  [phone] Fax:        [phone]  Weekly Treatment Note    Name: Deborah Mcintyre Date: 12/13/2015 MRN: [id number] DOB: 18-Jan-1955   Diagnosis:     ICD-9-CM ICD-10-CM   1. Malignant neoplasm of upper-outer quadrant of left breast in female, estrogen receptor negative ([place]) 174.4 C50.412    V86.1 Z17.1      Current dose: 39.6 Gy  Current fraction: 22   MEDICATIONS: Current Outpatient Prescriptions  Medication Sig Dispense Refill  . albuterol (PROVENTIL HFA;VENTOLIN HFA) 108 (90 Base) MCG/ACT inhaler Inhale 1-2 puffs into the lungs every 6 (six) hours as needed for wheezing or shortness of breath.    [name] albuterol (PROVENTIL) (2.5 MG/3ML) 0.083% nebulizer solution Take 2.5 mg by nebulization every 6 (six) hours as needed for wheezing or shortness of breath.    . ALPRAZolam (XANAX) 0.5 MG tablet Take 1 tablet (0.5 mg total) by mouth 3 (three) times daily as needed for anxiety or sleep. 60 tablet 0  . cycloSPORINE (RESTASIS) 0.05 % ophthalmic emulsion Place 2 drops into both eyes 2 (two) times daily.    . hyaluronate sodium (RADIAPLEXRX) GEL Apply 1 application topically 2 (two) times daily.    [name] ibuprofen (ADVIL,MOTRIN) 200 MG tablet Take 200 mg by mouth every 6 (six) hours as needed. Reported on 03/22/2015    . LORazepam (ATIVAN) 0.5 MG tablet Take 1 tablet (0.5 mg total) by mouth at bedtime. 30 tablet 0  . magic mouthwash w/lidocaine SOLN Take 5 mLs by mouth 2 (two) times daily as needed for mouth pain.    . metoprolol (LOPRESSOR) 50 MG tablet Take 0.5 tablets (25 mg total) by mouth 2 (two) times daily. (Patient taking differently: Take 50 mg by mouth 2 (two) times daily. )    . non-metallic deodorant (ALRA) MISC Apply 1 application topically daily as needed.    . ondansetron (ZOFRAN) 8 MG tablet TAKE 1 TABLET TWICE DAILY AS NEEDED STARTING 3RD DAY AFTER CHEMOTHERAPY 20 tablet 1  . oxyCODONE-acetaminophen (PERCOCET/ROXICET) 5-325 MG  tablet Take 1 tablet by mouth every 4 (four) hours as needed for moderate pain or severe pain (1 tab every 4-6 hour prn pain). 1 tab every 4-6 hours prn pain, dispense 60 tabs no refill    . PARoxetine (PAXIL) 20 MG tablet Take 20 mg by mouth 2 (two) times daily.     . prochlorperazine (COMPAZINE) 10 MG tablet TAKE 1 TABLET (10 MG TOTAL) BY MOUTH EVERY 6 (SIX) HOURS AS NEEDED (NAUSEA OR VOMITING). 30 tablet 1  . tetrahydrozoline 0.05 % ophthalmic solution Place 1 drop into both eyes daily as needed (dry eye).      No current facility-administered medications for this encounter.      ALLERGIES: Patient has no known allergies.   LABORATORY DATA:  Lab Results  Component Value Date   WBC 4.3 08/20/2015   HGB 13.1 08/20/2015   HCT 41.2 08/20/2015   MCV 99.8 08/20/2015   PLT 232 08/20/2015   Lab Results  Component Value Date   NA 140 08/20/2015   K 3.7 08/20/2015   CL 107 08/20/2015   CO2 24 08/20/2015   Lab Results  Component Value Date   ALT 10 06/21/2015   AST 13 06/21/2015   ALKPHOS 56 06/21/2015   BILITOT <0.30 06/21/2015     NARRATIVE: Deborah Mcintyre was seen today for weekly treatment management. The chart was checked and the patient's films were  reviewed.  Weekly rad txs left breast 22/33 completed, dry desquamation under axilla,  Hyperpigmentation on rest of left chest wall,  Using radiaplex bid, high b/p, will take med when she gets home,  Appetite good,  Pain in left axilla numbness and sharp throbbing  pains occasionally,resolves  On its own,someties lasts a while 3:30 PM BP (!) 149/101 (BP Location: Right Arm, Patient Position: Sitting, Cuff Size: Normal)   Pulse 99   Temp 98.3 F (36.8 C) (Oral)   Resp 20   Wt 200 lb (90.7 kg)   BMI 30.86 kg/m   Wt Readings from Last 3 Encounters:  12/13/15 200 lb (90.7 kg)  12/03/15 203 lb 1.6 oz (92.1 kg)  11/15/15 195 lb 3.2 oz (88.5 kg)  Hyperpigmentation present with dry desquamation in the treatment area. No moist  desquamation.  PHYSICAL EXAMINATION: weight is 200 lb (90.7 kg). Her oral temperature is 98.3 F (36.8 C). Her blood pressure is 149/101 (abnormal) and her pulse is 99. Her respiration is 20.        ASSESSMENT: The patient is doing satisfactorily with treatment.  PLAN: We will continue with the patient's radiation treatment as planned.

## 2015-12-13 NOTE — Progress Notes (Signed)
Weekly rad txs left breast 22/33 completed, dry desquamation under axilla,  Hyperpigmentation on rest of left chest wall,  Using radiaplex bid, high b/p, will take med when she gets home,  Appetite good,  Pain in left axilla numbness and sharp throbbing  pains occasionally,resolves  On its own,someties lasts a while 3:16 PM BP (!) 149/101 (BP Location: Right Arm, Patient Position: Sitting, Cuff Size: Normal)   Pulse 99   Temp 98.3 F (36.8 C) (Oral)   Resp 20   Wt 200 lb (90.7 kg)   BMI 30.86 kg/m   Wt Readings from Last 3 Encounters:  12/13/15 200 lb (90.7 kg)  12/03/15 203 lb 1.6 oz (92.1 kg)  11/15/15 195 lb 3.2 oz (88.5 kg)

## 2015-12-13 NOTE — Progress Notes (Signed)
Department of Radiation Oncology  Phone:  [phone] Fax:        [phone]  Weekly Treatment Note    Name: Deborah Mcintyre Date: 12/13/2015 MRN: [id number] DOB: 12-Jul-1955   Diagnosis:   No diagnosis found.   Current dose: 25.2 Gy  Current fraction: 14   MEDICATIONS: Current Outpatient Prescriptions  Medication Sig Dispense Refill  . albuterol (PROVENTIL HFA;VENTOLIN HFA) 108 (90 Base) MCG/ACT inhaler Inhale 1-2 puffs into the lungs every 6 (six) hours as needed for wheezing or shortness of breath.    [name] albuterol (PROVENTIL) (2.5 MG/3ML) 0.083% nebulizer solution Take 2.5 mg by nebulization every 6 (six) hours as needed for wheezing or shortness of breath.    . ALPRAZolam (XANAX) 0.5 MG tablet Take 1 tablet (0.5 mg total) by mouth 3 (three) times daily as needed for anxiety or sleep. 60 tablet 0  . cycloSPORINE (RESTASIS) 0.05 % ophthalmic emulsion Place 2 drops into both eyes 2 (two) times daily.    . hyaluronate sodium (RADIAPLEXRX) GEL Apply 1 application topically 2 (two) times daily.    [name] ibuprofen (ADVIL,MOTRIN) 200 MG tablet Take 200 mg by mouth every 6 (six) hours as needed. Reported on 03/22/2015    . LORazepam (ATIVAN) 0.5 MG tablet Take 1 tablet (0.5 mg total) by mouth at bedtime. 30 tablet 0  . magic mouthwash w/lidocaine SOLN Take 5 mLs by mouth 2 (two) times daily as needed for mouth pain.    . metoprolol (LOPRESSOR) 50 MG tablet Take 0.5 tablets (25 mg total) by mouth 2 (two) times daily. (Patient taking differently: Take 50 mg by mouth 2 (two) times daily. )    . non-metallic deodorant (ALRA) MISC Apply 1 application topically daily as needed.    . ondansetron (ZOFRAN) 8 MG tablet TAKE 1 TABLET TWICE DAILY AS NEEDED STARTING 3RD DAY AFTER CHEMOTHERAPY 20 tablet 1  . oxyCODONE-acetaminophen (PERCOCET/ROXICET) 5-325 MG tablet Take 1 tablet by mouth every 4 (four) hours as needed for moderate pain or severe pain (1 tab every 4-6 hour prn pain). 1 tab every 4-6  hours prn pain, dispense 60 tabs no refill    . PARoxetine (PAXIL) 20 MG tablet Take 20 mg by mouth 2 (two) times daily.     . prochlorperazine (COMPAZINE) 10 MG tablet TAKE 1 TABLET (10 MG TOTAL) BY MOUTH EVERY 6 (SIX) HOURS AS NEEDED (NAUSEA OR VOMITING). 30 tablet 1  . tetrahydrozoline 0.05 % ophthalmic solution Place 1 drop into both eyes daily as needed (dry eye).      No current facility-administered medications for this encounter.      ALLERGIES: Patient has no known allergies.   LABORATORY DATA:  Lab Results  Component Value Date   WBC 4.3 08/20/2015   HGB 13.1 08/20/2015   HCT 41.2 08/20/2015   MCV 99.8 08/20/2015   PLT 232 08/20/2015   Lab Results  Component Value Date   NA 140 08/20/2015   K 3.7 08/20/2015   CL 107 08/20/2015   CO2 24 08/20/2015   Lab Results  Component Value Date   ALT 10 06/21/2015   AST 13 06/21/2015   ALKPHOS 56 06/21/2015   BILITOT <0.30 06/21/2015     NARRATIVE: Deborah Mcintyre was seen today for weekly treatment management. The chart was checked and the patient's films were reviewed.  The patient states that she is doing well. No substantial difficulties over the last week. She continues to use skin cream on a daily basis.  PHYSICAL EXAMINATION: vitals were not taken for this visit.     Alert, no acute distress, no moist desquamation in the treatment area  ASSESSMENT: The patient is doing satisfactorily with treatment.  PLAN: We will continue with the patient's radiation treatment as planned.        This document serves as a record of services personally performed by Kyung Rudd, MD. It was created on his behalf by Bethann Humble, a trained medical scribe. The creation of this record is based on the scribe's personal observations and the provider's statements to them. This document has been checked and approved by the attending provider.

## 2015-12-16 ENCOUNTER — Ambulatory Visit: Payer: Medicare Other

## 2015-12-17 ENCOUNTER — Telehealth: Payer: Self-pay | Admitting: *Deleted

## 2015-12-17 ENCOUNTER — Ambulatory Visit: Payer: Medicare Other

## 2015-12-17 NOTE — Telephone Encounter (Signed)
Called and left voice message, patient hasn't been here for her radiation treatment today due at 2:45pm, asked to give [place] a call, asked if she was okay, or having car trouble again, 3:27 PM\

## 2015-12-18 ENCOUNTER — Ambulatory Visit
Admission: RE | Admit: 2015-12-18 | Discharge: 2015-12-18 | Disposition: A | Discharge status: Home / Self Care | Payer: Medicare Other | Source: Ambulatory Visit | Attending: Radiation Oncology | Admitting: Radiation Oncology

## 2015-12-18 ENCOUNTER — Ambulatory Visit: Payer: Medicare Other

## 2015-12-18 DIAGNOSIS — Z510 Encounter for antineoplastic radiation therapy: Secondary | ICD-10-CM | POA: Diagnosis not present

## 2015-12-19 ENCOUNTER — Ambulatory Visit: Payer: Medicare Other

## 2015-12-19 ENCOUNTER — Ambulatory Visit: Admission: RE | Admit: 2015-12-19 | Payer: Medicare Other | Source: Ambulatory Visit | Admitting: Radiation Oncology

## 2015-12-19 ENCOUNTER — Ambulatory Visit
Admission: RE | Admit: 2015-12-19 | Discharge: 2015-12-19 | Disposition: A | Discharge status: Home / Self Care | Payer: Medicare Other | Source: Ambulatory Visit | Attending: Radiation Oncology | Admitting: Radiation Oncology

## 2015-12-19 DIAGNOSIS — Z510 Encounter for antineoplastic radiation therapy: Secondary | ICD-10-CM | POA: Diagnosis not present

## 2015-12-20 ENCOUNTER — Ambulatory Visit: Payer: Medicare Other

## 2015-12-23 ENCOUNTER — Ambulatory Visit: Payer: Medicare Other

## 2015-12-24 ENCOUNTER — Ambulatory Visit: Payer: Medicare Other

## 2015-12-24 ENCOUNTER — Ambulatory Visit
Admission: RE | Admit: 2015-12-24 | Discharge: 2015-12-24 | Disposition: A | Discharge status: Home / Self Care | Payer: Medicare Other | Source: Ambulatory Visit | Attending: Radiation Oncology | Admitting: Radiation Oncology

## 2015-12-24 ENCOUNTER — Other Ambulatory Visit: Payer: Self-pay | Admitting: Radiation Oncology

## 2015-12-24 DIAGNOSIS — C50412 Malignant neoplasm of upper-outer quadrant of left female breast: Secondary | ICD-10-CM

## 2015-12-24 DIAGNOSIS — Z510 Encounter for antineoplastic radiation therapy: Secondary | ICD-10-CM | POA: Diagnosis not present

## 2015-12-24 MED ORDER — OXYCODONE-ACETAMINOPHEN 5-325 MG PO TABS: 1.0000 | ORAL_TABLET | ORAL | 0 refills | Status: DC | PRN

## 2015-12-25 ENCOUNTER — Telehealth: Payer: Self-pay | Admitting: *Deleted

## 2015-12-25 ENCOUNTER — Ambulatory Visit: Payer: Medicare Other

## 2015-12-25 NOTE — Telephone Encounter (Signed)
Called patient to inform of Pt appt. For 12-31-15- arrival time - 3:45 pm @ [facility], lvm for a return call

## 2015-12-26 ENCOUNTER — Ambulatory Visit: Payer: Medicare Other

## 2015-12-26 ENCOUNTER — Ambulatory Visit
Admission: RE | Admit: 2015-12-26 | Discharge: 2015-12-26 | Disposition: A | Discharge status: Home / Self Care | Payer: Medicare Other | Source: Ambulatory Visit | Attending: Radiation Oncology | Admitting: Radiation Oncology

## 2015-12-26 DIAGNOSIS — Z510 Encounter for antineoplastic radiation therapy: Secondary | ICD-10-CM | POA: Diagnosis not present

## 2015-12-27 ENCOUNTER — Ambulatory Visit: Payer: Medicare Other

## 2015-12-27 ENCOUNTER — Ambulatory Visit
Admission: RE | Admit: 2015-12-27 | Discharge: 2015-12-27 | Disposition: A | Discharge status: Home / Self Care | Payer: Medicare Other | Source: Ambulatory Visit | Attending: Radiation Oncology | Admitting: Radiation Oncology

## 2015-12-27 ENCOUNTER — Encounter: Payer: Self-pay | Admitting: Radiation Oncology

## 2015-12-27 VITALS — BP 142/97 | HR 85 | Temp 98.4°F | Resp 20 | Wt 201.1 lb

## 2015-12-27 DIAGNOSIS — C50412 Malignant neoplasm of upper-outer quadrant of left female breast: Secondary | ICD-10-CM

## 2015-12-27 DIAGNOSIS — Z510 Encounter for antineoplastic radiation therapy: Secondary | ICD-10-CM | POA: Diagnosis not present

## 2015-12-27 NOTE — Progress Notes (Signed)
Weekly rad txs left breast, 27/33 completed,  Dry desquamation . Hyperpigmentation, using radiaplex bid, out of b/p medication, appetite good, pain under axillaq, takes percocet which helps 3:53 PM There were no vitals taken for this visit.  Wt Readings from Last 3 Encounters:  12/24/15 202 lb 9.6 oz (91.9 kg)  12/13/15 200 lb (90.7 kg)  12/03/15 203 lb 1.6 oz (92.1 kg)  BP (!) 142/97 (BP Location: Right Arm, Patient Position: Sitting, Cuff Size: Normal)   Pulse 85   Temp 98.4 F (36.9 C) (Oral)   Resp 20   Wt 201 lb 1.6 oz (91.2 kg)   BMI 31.03 kg/m

## 2015-12-27 NOTE — Progress Notes (Signed)
Department of Radiation Oncology  Phone:  [phone] Fax:        [phone]  Weekly Treatment Note    Name: Deborah Mcintyre Date: 12/27/2015 MRN: [id number] DOB: 01-18-1955   Diagnosis:     ICD-9-CM ICD-10-CM   1. Malignant neoplasm of upper-outer quadrant of left female breast, unspecified estrogen receptor status ([place]) 174.4 C50.412      Current dose: 48.6 Gy  Current fraction: 27   MEDICATIONS: Current Outpatient Prescriptions  Medication Sig Dispense Refill  . albuterol (PROVENTIL HFA;VENTOLIN HFA) 108 (90 Base) MCG/ACT inhaler Inhale 1-2 puffs into the lungs every 6 (six) hours as needed for wheezing or shortness of breath.    [name] albuterol (PROVENTIL) (2.5 MG/3ML) 0.083% nebulizer solution Take 2.5 mg by nebulization every 6 (six) hours as needed for wheezing or shortness of breath.    . ALPRAZolam (XANAX) 0.5 MG tablet Take 1 tablet (0.5 mg total) by mouth 3 (three) times daily as needed for anxiety or sleep. 60 tablet 0  . cycloSPORINE (RESTASIS) 0.05 % ophthalmic emulsion Place 2 drops into both eyes 2 (two) times daily.    . hyaluronate sodium (RADIAPLEXRX) GEL Apply 1 application topically 2 (two) times daily.    [name] ibuprofen (ADVIL,MOTRIN) 200 MG tablet Take 200 mg by mouth every 6 (six) hours as needed. Reported on 03/22/2015    . LORazepam (ATIVAN) 0.5 MG tablet Take 1 tablet (0.5 mg total) by mouth at bedtime. 30 tablet 0  . metoprolol (LOPRESSOR) 50 MG tablet Take 0.5 tablets (25 mg total) by mouth 2 (two) times daily. (Patient taking differently: Take 50 mg by mouth 2 (two) times daily. )    . non-metallic deodorant (ALRA) MISC Apply 1 application topically daily as needed.    . ondansetron (ZOFRAN) 8 MG tablet TAKE 1 TABLET TWICE DAILY AS NEEDED STARTING 3RD DAY AFTER CHEMOTHERAPY 20 tablet 1  . oxyCODONE-acetaminophen (PERCOCET/ROXICET) 5-325 MG tablet Take 1 tablet by mouth every 4 (four) hours as needed for moderate pain or severe pain (1 tab every 4-6  hour prn pain). 1 tab every 4-6 hours prn pain, dispense 60 tabs no refill 60 tablet 0  . PARoxetine (PAXIL) 20 MG tablet Take 20 mg by mouth 2 (two) times daily.     . prochlorperazine (COMPAZINE) 10 MG tablet TAKE 1 TABLET (10 MG TOTAL) BY MOUTH EVERY 6 (SIX) HOURS AS NEEDED (NAUSEA OR VOMITING). 30 tablet 1  . tetrahydrozoline 0.05 % ophthalmic solution Place 1 drop into both eyes daily as needed (dry eye).     . magic mouthwash w/lidocaine SOLN Take 5 mLs by mouth 2 (two) times daily as needed for mouth pain.     No current facility-administered medications for this encounter.      ALLERGIES: Patient has no known allergies.   LABORATORY DATA:  Lab Results  Component Value Date   WBC 4.3 08/20/2015   HGB 13.1 08/20/2015   HCT 41.2 08/20/2015   MCV 99.8 08/20/2015   PLT 232 08/20/2015   Lab Results  Component Value Date   NA 140 08/20/2015   K 3.7 08/20/2015   CL 107 08/20/2015   CO2 24 08/20/2015   Lab Results  Component Value Date   ALT 10 06/21/2015   AST 13 06/21/2015   ALKPHOS 56 06/21/2015   BILITOT <0.30 06/21/2015     NARRATIVE: Deborah Mcintyre was seen today for weekly treatment management. The chart was checked and the patient's films were reviewed.  The patient  has completed 27/33 fractions to her left breast. The patient reports pain under her left axilla for which she takes Percocet with some relief. She reports a poor appetite. The patient is using Radiaplex bid as directed. She reports she has run out of her BP medication.  PHYSICAL EXAMINATION: weight is 201 lb 1.6 oz (91.2 kg). Her oral temperature is 98.4 F (36.9 C). Her blood pressure is 142/97 (abnormal) and her pulse is 85. Her respiration is 20.  The patient is well appearing. Some peeling noted in the treatment area.  ASSESSMENT: The patient is doing satisfactorily with treatment.  PLAN: We will continue with the patient's radiation treatment as planned.    ------------------------------------------------   Tyler Pita, MD [place] Director and Director of Stereotactic Radiosurgery Direct Dial: [phone]  Fax: [phone] [email or web address]  Skype  [email or web address]  This document serves as a record of services personally performed by Tyler Pita, MD. It was created on his behalf by Maryla Morrow, a trained medical scribe. The creation of this record is based on the scribe's personal observations and the provider's statements to them. This document has been checked and approved by the attending provider.

## 2015-12-29 ENCOUNTER — Ambulatory Visit: Payer: Medicare Other

## 2015-12-30 ENCOUNTER — Ambulatory Visit: Payer: Medicare Other

## 2015-12-31 ENCOUNTER — Ambulatory Visit
Admission: RE | Admit: 2015-12-31 | Discharge: 2015-12-31 | Disposition: A | Discharge status: Home / Self Care | Payer: Medicare Other | Source: Ambulatory Visit | Attending: Radiation Oncology | Admitting: Radiation Oncology

## 2015-12-31 ENCOUNTER — Ambulatory Visit: Payer: Medicare Other

## 2015-12-31 ENCOUNTER — Ambulatory Visit: Payer: Medicare Other | Attending: Radiation Oncology | Admitting: Physical Therapy

## 2015-12-31 DIAGNOSIS — Z510 Encounter for antineoplastic radiation therapy: Secondary | ICD-10-CM | POA: Diagnosis not present

## 2016-01-01 ENCOUNTER — Ambulatory Visit: Payer: Medicare Other

## 2016-01-01 ENCOUNTER — Ambulatory Visit
Admission: RE | Admit: 2016-01-01 | Discharge: 2016-01-01 | Disposition: A | Discharge status: Home / Self Care | Payer: Medicare Other | Source: Ambulatory Visit | Attending: Radiation Oncology | Admitting: Radiation Oncology

## 2016-01-01 DIAGNOSIS — Z510 Encounter for antineoplastic radiation therapy: Secondary | ICD-10-CM | POA: Diagnosis not present

## 2016-01-02 ENCOUNTER — Ambulatory Visit: Payer: Medicare Other

## 2016-01-02 ENCOUNTER — Ambulatory Visit
Admission: RE | Admit: 2016-01-02 | Discharge: 2016-01-02 | Disposition: A | Discharge status: Home / Self Care | Payer: Medicare Other | Source: Ambulatory Visit | Attending: Radiation Oncology | Admitting: Radiation Oncology

## 2016-01-02 DIAGNOSIS — Z510 Encounter for antineoplastic radiation therapy: Secondary | ICD-10-CM | POA: Diagnosis not present

## 2016-01-03 ENCOUNTER — Ambulatory Visit: Payer: Medicare Other

## 2016-01-03 ENCOUNTER — Ambulatory Visit
Admission: RE | Admit: 2016-01-03 | Discharge: 2016-01-03 | Disposition: A | Discharge status: Home / Self Care | Payer: Medicare Other | Source: Ambulatory Visit | Attending: Radiation Oncology | Admitting: Radiation Oncology

## 2016-01-03 VITALS — BP 134/93 | HR 94 | Temp 98.3°F | Resp 12 | Wt 200.8 lb

## 2016-01-03 DIAGNOSIS — C50412 Malignant neoplasm of upper-outer quadrant of left female breast: Secondary | ICD-10-CM

## 2016-01-03 DIAGNOSIS — Z510 Encounter for antineoplastic radiation therapy: Secondary | ICD-10-CM | POA: Diagnosis not present

## 2016-01-03 NOTE — Progress Notes (Signed)
Department of Radiation Oncology  Phone:  [phone] Fax:        [phone]  Weekly Treatment Note    Name: Deborah Mcintyre Date: 01/03/2016 MRN: [id number] DOB: [birth year]   Diagnosis:     ICD-9-CM ICD-10-CM   1. Malignant neoplasm of upper-outer quadrant of left female breast, unspecified estrogen receptor status ([place]) 174.4 C50.412      Current dose: 56.4 Gy  Current fraction: 31   MEDICATIONS: Current Outpatient Prescriptions  Medication Sig Dispense Refill  . albuterol (PROVENTIL HFA;VENTOLIN HFA) 108 (90 Base) MCG/ACT inhaler Inhale 1-2 puffs into the lungs every 6 (six) hours as needed for wheezing or shortness of breath.    [name] albuterol (PROVENTIL) (2.5 MG/3ML) 0.083% nebulizer solution Take 2.5 mg by nebulization every 6 (six) hours as needed for wheezing or shortness of breath.    . ALPRAZolam (XANAX) 0.5 MG tablet Take 1 tablet (0.5 mg total) by mouth 3 (three) times daily as needed for anxiety or sleep. 60 tablet 0  . cycloSPORINE (RESTASIS) 0.05 % ophthalmic emulsion Place 2 drops into both eyes 2 (two) times daily.    . hyaluronate sodium (RADIAPLEXRX) GEL Apply 1 application topically 2 (two) times daily.    [name] ibuprofen (ADVIL,MOTRIN) 200 MG tablet Take 200 mg by mouth every 6 (six) hours as needed. Reported on 03/22/2015    . LORazepam (ATIVAN) 0.5 MG tablet Take 1 tablet (0.5 mg total) by mouth at bedtime. 30 tablet 0  . magic mouthwash w/lidocaine SOLN Take 5 mLs by mouth 2 (two) times daily as needed for mouth pain.    . metoprolol (LOPRESSOR) 50 MG tablet Take 0.5 tablets (25 mg total) by mouth 2 (two) times daily. (Patient taking differently: Take 50 mg by mouth 2 (two) times daily. )    . non-metallic deodorant (ALRA) MISC Apply 1 application topically daily as needed.    . ondansetron (ZOFRAN) 8 MG tablet TAKE 1 TABLET TWICE DAILY AS NEEDED STARTING 3RD DAY AFTER CHEMOTHERAPY 20 tablet 1  . oxyCODONE-acetaminophen (PERCOCET/ROXICET) 5-325 MG tablet  Take 1 tablet by mouth every 4 (four) hours as needed for moderate pain or severe pain (1 tab every 4-6 hour prn pain). 1 tab every 4-6 hours prn pain, dispense 60 tabs no refill 60 tablet 0  . PARoxetine (PAXIL) 20 MG tablet Take 20 mg by mouth 2 (two) times daily.     . prochlorperazine (COMPAZINE) 10 MG tablet TAKE 1 TABLET (10 MG TOTAL) BY MOUTH EVERY 6 (SIX) HOURS AS NEEDED (NAUSEA OR VOMITING). 30 tablet 1  . tetrahydrozoline 0.05 % ophthalmic solution Place 1 drop into both eyes daily as needed (dry eye).      No current facility-administered medications for this encounter.      ALLERGIES: Patient has no known allergies.   LABORATORY DATA:  Lab Results  Component Value Date   WBC 4.3 08/20/2015   HGB 13.1 08/20/2015   HCT 41.2 08/20/2015   MCV 99.8 08/20/2015   PLT 232 08/20/2015   Lab Results  Component Value Date   NA 140 08/20/2015   K 3.7 08/20/2015   CL 107 08/20/2015   CO2 24 08/20/2015   Lab Results  Component Value Date   ALT 10 06/21/2015   AST 13 06/21/2015   ALKPHOS 56 06/21/2015   BILITOT <0.30 06/21/2015     NARRATIVE: Deborah Mcintyre was seen today for weekly treatment management. The chart was checked and the patient's films were reviewed.  The patient  has completed 31/33 fractions to her left breast. The patient reports pain 3/10 in severity, which is burning and dull over the left breast. She denies edema. She reports fatigue and weakness.  PHYSICAL EXAMINATION: weight is 200 lb 12.8 oz (91.1 kg). Her oral temperature is 98.3 F (36.8 C). Her blood pressure is 134/93 (abnormal) and her pulse is 94. Her respiration is 12 and oxygen saturation is 100%.  The patient is well appearing. Hyperpigmentation and diffuse dryness in the treatment area without any moist desquamation.   ASSESSMENT: The patient is doing satisfactorily with treatment.  PLAN: We will continue with the patient's radiation treatment as planned.    ------------------------------------------------  Jodelle Gross, MD, PhD  This document serves as a record of services personally performed by Kyung Rudd, MD. It was created on his behalf by Maryla Morrow, a trained medical scribe. The creation of this record is based on the scribe's personal observations and the provider's statements to them. This document has been checked and approved by the attending provider.

## 2016-01-03 NOTE — Progress Notes (Signed)
  Radiation Oncology         [phone]) [phone] ________________________________  Name: Deborah Mcintyre MRN: [id number]  Date: 12/11/2015  DOB: [birth year]  Complex simulation note  The patient has undergone complex simulation for her upcoming boost treatment for her diagnosis of breast cancer. The patient has initially been planned to receive 50.4 Gy to the chest wall. The patient will now receive a 10 Gy boost to the mastectomy scar which has been identified. This will be accomplished using an en face electron field. Based on the depth of the target area, 9 MeV electrons will be used. The patient's final total dose therefore will be 60.4 Gy. A special port plan is requested for the boost treatment.   _______________________________  Jodelle Gross, MD, PhD

## 2016-01-03 NOTE — Progress Notes (Addendum)
PAIN: She rates her pain as a 3 on a scale of 0-10. intermittent, burning and dull over left breast. SKIN: Pt left breast and left posterior upper shoulder, positive for Hyperpigmentation, breast tenderness and dry desquamation, noted hyperpigmentation to left clavicle area.  Pt denies edema.  Pt continues to apply Radiaplex as directed. OTHER: Pt complains of fatigue and weakness. BP (!) 134/93   Pulse 94   Temp 98.3 F (36.8 C) (Oral)   Resp 12   Wt 200 lb 12.8 oz (91.1 kg)   SpO2 100%   BMI 30.99 kg/m  Wt Readings from Last 3 Encounters:  01/03/16 200 lb 12.8 oz (91.1 kg)  12/27/15 201 lb 1.6 oz (91.2 kg)  12/24/15 202 lb 9.6 oz (91.9 kg)

## 2016-01-06 ENCOUNTER — Ambulatory Visit: Payer: Medicare Other

## 2016-01-07 ENCOUNTER — Ambulatory Visit: Payer: Medicare Other

## 2016-01-07 ENCOUNTER — Ambulatory Visit
Admission: RE | Admit: 2016-01-07 | Discharge: 2016-01-07 | Disposition: A | Discharge status: Home / Self Care | Payer: Medicare Other | Source: Ambulatory Visit | Attending: Radiation Oncology | Admitting: Radiation Oncology

## 2016-01-07 DIAGNOSIS — Z171 Estrogen receptor negative status [ER-]: Secondary | ICD-10-CM | POA: Diagnosis not present

## 2016-01-07 DIAGNOSIS — Z79899 Other long term (current) drug therapy: Secondary | ICD-10-CM | POA: Diagnosis not present

## 2016-01-07 DIAGNOSIS — C50412 Malignant neoplasm of upper-outer quadrant of left female breast: Secondary | ICD-10-CM | POA: Diagnosis not present

## 2016-01-07 DIAGNOSIS — Z510 Encounter for antineoplastic radiation therapy: Secondary | ICD-10-CM | POA: Diagnosis present

## 2016-01-08 ENCOUNTER — Ambulatory Visit: Payer: Medicare Other

## 2016-01-08 ENCOUNTER — Ambulatory Visit
Admission: RE | Admit: 2016-01-08 | Discharge: 2016-01-08 | Disposition: A | Discharge status: Home / Self Care | Payer: Medicare Other | Source: Ambulatory Visit | Attending: Radiation Oncology | Admitting: Radiation Oncology

## 2016-01-08 DIAGNOSIS — Z510 Encounter for antineoplastic radiation therapy: Secondary | ICD-10-CM | POA: Diagnosis not present

## 2016-01-09 ENCOUNTER — Ambulatory Visit: Payer: Medicare Other

## 2016-01-10 ENCOUNTER — Encounter: Payer: Self-pay | Admitting: *Deleted

## 2016-01-15 ENCOUNTER — Ambulatory Visit: Payer: Medicare Other

## 2016-01-15 ENCOUNTER — Encounter: Payer: Self-pay | Admitting: Radiation Oncology

## 2016-01-15 NOTE — Progress Notes (Signed)
°  Radiation Oncology         [phone]) [phone] ________________________________  Name: Deborah Mcintyre MRN: [id number]  Date: 01/15/2016  DOB: [birth year]  End of Treatment Note  Diagnosis:   Malignant neoplasm of upper-outer quadrant of left breast     Indication for treatment:  Curative       Radiation treatment dates:   10/31/15 - 01/08/16  Site/dose:   Left Chest Wall and Left SCLV treated to 50.4 Gy in 28 fractions of 1.8 Gy. Left Chest Wall was then boosted to 60.4 Gy in 5 fractions of 2 Gy.  Beams/energy:   3D  //  10X, 6X        Boost:  Depth Dose  //  9MeV  Narrative: The patient tolerated radiation treatment relatively well. She experienced fatigue and weakness throughout the course of treatment. The patient experienced pain, both burning and dull, over the left breast, which she rated 3/10 in severity. She denied edema throughout treatment.  Plan: The patient has completed radiation treatment. The patient will return to radiation oncology clinic for routine followup in one month. I advised them to call or return sooner if they have any questions or concerns related to their recovery or treatment.  ------------------------------------------------  Jodelle Gross, MD, PhD  This document serves as a record of services personally performed by Kyung Rudd, MD. It was created on his behalf by Maryla Morrow, a trained medical scribe. The creation of this record is based on the scribe's personal observations and the provider's statements to them. This document has been checked and approved by the attending provider.

## 2016-01-20 NOTE — Assessment & Plan Note (Deleted)
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  Treatment summary: 1. Neoadjuvant chemotherapy with dose dense Adriamycin and Cytoxan 4 followed by Taxol and carboplatin 12 weekly from 01/18/2015 to 06/21/2015 2. left Mastectomy With axillary lymph node dissection 08/23/2015: Invasive ductal carcinoma with calcifications grade 3, 1.3 cm, image, margins negative, 1/6 lymph nodes positive with extracapsular extension, ER 0%, 0%, T1cN1a stage IIA 3. Plan: Adjuvant XRT 10/31/15 -   Plan:  1. Adj Capecitabine for 6 months. Patient will start to Xeloda after the holidays in January. 2. SWOG 1418 trial with Pembro vs Placebo  RTC in 3 weeks on Xeloda

## 2016-01-21 ENCOUNTER — Telehealth: Payer: Self-pay | Admitting: *Deleted

## 2016-01-21 ENCOUNTER — Ambulatory Visit: Payer: Medicare Other | Admitting: Hematology and Oncology

## 2016-01-21 ENCOUNTER — Other Ambulatory Visit: Payer: Medicare Other

## 2016-01-21 NOTE — Progress Notes (Deleted)
[name]. Storti 61 y.o. woman with   one month FU Skin status: What lotion are you using?  Have you seen med onc? If not, when is appointment: If they are ER+, have they started Al or Tamoxifen? If not, why?  Discuss survivorship appointment.  Have you had a mammogram scheduled? Not scheduled yet Offer referral to [facility]. Will receive at the survivorship appointment. Oncotype Dx. Score: Appetite: Pain: Fatigue: Arm mobility: Lymphedema:

## 2016-01-21 NOTE — Telephone Encounter (Signed)
Patient voice message  : Requesting refill on percocet, last written 12/24/15 by Bryson Ha , states "I ran out last night, my arm and breast burning,"  She  Completed radiation to her breast 01/08/16 her follow up with our PA Bryson Ha is 02/06/16, she did not show for her appt with Dr. Lindi Adie today, will call patient at [phone] after advice from [facility] 2:56 PM

## 2016-01-21 NOTE — Telephone Encounter (Signed)
Bryson Ha will see patient for follow up tomorrow at 2:30pm, called and gave appt time for patient to arrive at 2:15pm to check in at registration for 2:30pm appt, patient thanked this Rn and Bryson Ha for appt so soon

## 2016-01-21 NOTE — Telephone Encounter (Signed)
Patient c/o skin burning and chest where radiation was, asked if she could come in today,  But was told no, asked if she could come in tomorrow to see Bryson Ha, will call her back once Bryson Ha is out of seeing the next patient, also reminded patient she missed her lab ,flush an appt with Dr. Lindi Adie today, she forgot stated,asked if she could still come in today and see Dr. Lindi Adie, offered to transfer her to Dr. Geralyn Flash nurse phone, patient will call her self 3:06 PM

## 2016-01-22 ENCOUNTER — Telehealth: Payer: Self-pay | Admitting: Radiation Oncology

## 2016-01-22 ENCOUNTER — Ambulatory Visit
Admission: RE | Admit: 2016-01-22 | Discharge: 2016-01-22 | Disposition: A | Discharge status: Home / Self Care | Payer: Medicare Other | Source: Ambulatory Visit | Attending: Radiation Oncology | Admitting: Radiation Oncology

## 2016-01-22 DIAGNOSIS — C50412 Malignant neoplasm of upper-outer quadrant of left female breast: Secondary | ICD-10-CM

## 2016-01-22 DIAGNOSIS — Z171 Estrogen receptor negative status [ER-]: Principal | ICD-10-CM

## 2016-01-22 NOTE — Telephone Encounter (Signed)
I LM with the patient on both phone numbers listed to review her concerns regarding pain.

## 2016-01-23 ENCOUNTER — Encounter: Payer: Self-pay | Admitting: *Deleted

## 2016-01-23 ENCOUNTER — Ambulatory Visit
Admission: RE | Admit: 2016-01-23 | Discharge: 2016-01-23 | Disposition: A | Discharge status: Home / Self Care | Payer: Medicare Other | Source: Ambulatory Visit | Attending: Radiation Oncology | Admitting: Radiation Oncology

## 2016-01-23 NOTE — Progress Notes (Signed)
53 Spoke with [name] and she cancelled her appointment for today and will come in on Monday 01-27-16 at 1045.

## 2016-01-23 NOTE — Progress Notes (Deleted)
[name] 61 y.o. Woman with malignant neoplasm of upper-outer quadrant of left breast in female, estrogen receptor negative, completed 01-08-16 FU c/o pain.

## 2016-01-23 NOTE — Progress Notes (Deleted)
Mrs. Moe Brier. Boom 61 y.o. woman with Malignant neoplasm of upper-outer quadrant of left breast in female, estrogen receptor negative FU completed left breast  01-08-16 request pain medication.  Skin status: What lotion are you using?  Have you seen med onc? If not, when is appointment:12-02-16 Dr. Lindi Adie was a no show for 01-21-16 appointment If they are ER+, have they started Al or Tamoxifen? If not, why?  Discuss survivorship appointment.  Have you had a mammogram scheduled? Not scheduled yet Offer referral to [facility]. Will receive at the survivorship appointment. Oncotype Dx. Score:                                   start to Xeloda after the holidays in January Appetite: Pain: Fatigue: Arm mobility: Lymphedema:

## 2016-01-27 ENCOUNTER — Ambulatory Visit
Admission: RE | Admit: 2016-01-27 | Discharge: 2016-01-27 | Disposition: A | Discharge status: Home / Self Care | Payer: Medicare Other | Source: Ambulatory Visit | Attending: Radiation Oncology | Admitting: Radiation Oncology

## 2016-01-27 ENCOUNTER — Telehealth: Payer: Self-pay | Admitting: *Deleted

## 2016-01-27 ENCOUNTER — Encounter: Payer: Self-pay | Admitting: Radiation Oncology

## 2016-01-27 VITALS — HR 96 | Temp 98.7°F | Resp 18 | Ht 67.5 in | Wt 202.4 lb

## 2016-01-27 DIAGNOSIS — Z170 Estrogen receptor positive status [ER+]: Secondary | ICD-10-CM | POA: Diagnosis not present

## 2016-01-27 DIAGNOSIS — Z79899 Other long term (current) drug therapy: Secondary | ICD-10-CM | POA: Diagnosis not present

## 2016-01-27 DIAGNOSIS — Z923 Personal history of irradiation: Secondary | ICD-10-CM | POA: Diagnosis not present

## 2016-01-27 DIAGNOSIS — C50412 Malignant neoplasm of upper-outer quadrant of left female breast: Secondary | ICD-10-CM

## 2016-01-27 DIAGNOSIS — G629 Polyneuropathy, unspecified: Secondary | ICD-10-CM | POA: Insufficient documentation

## 2016-01-27 DIAGNOSIS — C50912 Malignant neoplasm of unspecified site of left female breast: Secondary | ICD-10-CM | POA: Insufficient documentation

## 2016-01-27 DIAGNOSIS — Z9012 Acquired absence of left breast and nipple: Secondary | ICD-10-CM | POA: Insufficient documentation

## 2016-01-27 MED ORDER — GABAPENTIN 300 MG PO CAPS
300.0000 mg | ORAL_CAPSULE | Freq: Three times a day (TID) | ORAL | 2 refills | Status: DC
Start: 2016-01-27 — End: 2016-05-25

## 2016-01-27 NOTE — Progress Notes (Signed)
Mrs. Montzerrat Brunell. Curd 62 y.o. woman with Malignant neoplasm of upper-outer quadrant of left breast in female, estrogen receptor negative completed left breast  01-08-16 FU c/o pain.  Skin status:Mild hyperpigmentation What lotion are you using? Radiaplex gel will change to lotion with vitamin E Have you seen med onc? If not, when is appointment:12-02-16 Dr. Lindi Adie was a no show for 01-21-16 appointment If they are ER+, have they started Al or Tamoxifen? If not, why? Triple negative Oncotype Dx. Score:                                   start to Xeloda after the holidays in January Appetite:Good Pain:3/10 taking Oxycodone Fatigue:Having fatigue all day. Arm mobility:Having problems raising left arm stated it hurts to raise the left arm. Lymphedema:None Wt Readings from Last 3 Encounters:  01/27/16 202 lb 6.4 oz (91.8 kg)  01/03/16 200 lb 12.8 oz (91.1 kg)  12/27/15 201 lb 1.6 oz (91.2 kg)  Pulse 96   Temp 98.7 F (37.1 C) (Oral)   Resp 18   Ht 5' 7.5" (1.715 m)   Wt 202 lb 6.4 oz (91.8 kg)   BMI 31.23 kg/m

## 2016-01-27 NOTE — Telephone Encounter (Signed)
CALLED PATIENT TO INFORM OF PT APPT. ON 01-28-16- ARRIVAL TIME - 3 PM @ [facility], LVM FOR A RETURN CALL

## 2016-01-27 NOTE — Progress Notes (Signed)
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   Interval Since Last Radiation:  2 weeks   12//17-1/10/18:  1. 50.4 Gy to the left chest wall  2. 10 Gy boost to the left mastectomy scar  Narrative:  The patient returns today for routine follow-up. During treatment she did very well with radiotherapy and did not have significant desquamation, but she did have complaints of neuropathic pain in the axilla on the left since surgery. She has managed this with oxycodone and called in wanting a new prescription for this.                             On review of systems, the patient states she has a burning pain that causes her to have a hard time allowing her arm to relax against the lateral side of her chest wall. She denies any blistering or rash that has appeared. Her pain is helped somewhat by oxycodone. She denies any skin concerns over the chest wall, or edema. No other complaints are noted.  ALLERGIES:  has No Known Allergies.  Meds: Current Outpatient Prescriptions  Medication Sig Dispense Refill  . albuterol (PROVENTIL HFA;VENTOLIN HFA) 108 (90 Base) MCG/ACT inhaler Inhale 1-2 puffs into the lungs every 6 (six) hours as needed for wheezing or shortness of breath.    [name] albuterol (PROVENTIL) (2.5 MG/3ML) 0.083% nebulizer solution Take 2.5 mg by nebulization every 6 (six) hours as needed for wheezing or shortness of breath.    . ALPRAZolam (XANAX) 0.5 MG tablet Take 1 tablet (0.5 mg total) by mouth 3 (three) times daily as needed for anxiety or sleep. 60 tablet 0  . cycloSPORINE (RESTASIS) 0.05 % ophthalmic emulsion Place 2 drops into both eyes 2 (two) times daily.    . hyaluronate sodium (RADIAPLEXRX) GEL Apply 1 application topically 2 (two) times daily.      [name] ibuprofen (ADVIL,MOTRIN) 200 MG tablet Take 200 mg by mouth every 6 (six) hours as needed. Reported on 03/22/2015    . metoprolol (LOPRESSOR) 50 MG tablet Take 0.5 tablets (25 mg total) by mouth 2 (two) times daily. (Patient taking differently: Take 50 mg by mouth 2 (two) times daily. )    . oxyCODONE-acetaminophen (PERCOCET/ROXICET) 5-325 MG tablet Take 1 tablet by mouth every 4 (four) hours as needed for moderate pain or severe pain (1 tab every 4-6 hour prn pain). 1 tab every 4-6 hours prn pain, dispense 60 tabs no refill 60 tablet 0  . PARoxetine (PAXIL) 20 MG tablet Take 20 mg by mouth 2 (two) times daily.     . prochlorperazine (COMPAZINE) 10 MG tablet TAKE 1 TABLET (10 MG TOTAL) BY MOUTH EVERY 6 (SIX) HOURS AS NEEDED (NAUSEA OR VOMITING). 30 tablet 1  . tetrahydrozoline 0.05 % ophthalmic solution Place 1 drop into both eyes daily as needed (dry eye).     [name] gabapentin (NEURONTIN) 300 MG capsule Take 1 capsule (300 mg total) by mouth 3 (three) times daily. 90 capsule 2  . LORazepam (ATIVAN) 0.5 MG tablet Take 1 tablet (0.5 mg total) by mouth at bedtime. (Patient not taking: Reported on 01/27/2016) 30 tablet 0  . magic mouthwash w/lidocaine SOLN Take 5 mLs by mouth  2 (two) times daily as needed for mouth pain.    [name] ondansetron (ZOFRAN) 8 MG tablet TAKE 1 TABLET TWICE DAILY AS NEEDED STARTING 3RD DAY AFTER CHEMOTHERAPY (Patient not taking: Reported on 01/27/2016) 20 tablet 1   No current facility-administered medications for this encounter.     Physical Findings:  height is 5' 7.5" (1.715 m) and weight is 202 lb 6.4 oz (91.8 kg). Her oral temperature is 98.7 F (37.1 C). Her pulse is 96. Her respiration is 18.  In general this is a well appearing African American female in no acute distress. She's alert and oriented x4 and appropriate throughout the examination. Cardiopulmonary assessment is negative for acute distress and she exhibits normal effort. The left breast is surgically excised. There  is minimal hyperpigmentation along the chest wall, and no evidence of desquamation or skin disruption. No rashes are seen. No edema of the chest wall or left upper extremity is noted. She has limited range of motion due to pain.    Lab Findings: Lab Results  Component Value Date   WBC 4.3 08/20/2015   HGB 13.1 08/20/2015   HCT 41.2 08/20/2015   MCV 99.8 08/20/2015   PLT 232 08/20/2015     Radiographic Findings: No results found.  Impression/Plan: 1. Stage IIIB, T4, N1 ER/PR positive inflammatory breast cancer. The patient has been doing well since completion of radiotherapy. We discussed that we would be happy to continue to follow her as needed, but she will also continue to follow up with Dr. Lindi Adie in medical oncology. She was counseled on skin care as well as measures to avoid sun exposure to this area.  2. Nerve pain. The patient has had trouble with neuropathic pain in the left axilla and mastectomy site since surgery. I have spent time discussing with her that narcotic pain medication is not the best solution to her pain. Despite that she feels this helps, we reviewed that there are other solutions to neuropathic pain such as gabapentin. The off label use of this may help her neuropathic symptoms. I will also refer her to PT for evaluation and treatment as she is limited in her range of motion somewhat due to her pain. I will share this as well with Dr. Lucia Gaskins and Dr. Lindi Adie for their input. If her symptoms do not improve, we would likely have her meet with neurology for more instruction on management of this long term. She states agreement and understanding.      Carola Rhine, PAC

## 2016-01-28 ENCOUNTER — Ambulatory Visit: Payer: Medicare Other | Admitting: Physical Therapy

## 2016-01-28 ENCOUNTER — Ambulatory Visit: Payer: Medicare Other | Attending: Radiation Oncology | Admitting: Physical Therapy

## 2016-01-29 ENCOUNTER — Telehealth: Payer: Self-pay | Admitting: *Deleted

## 2016-01-29 NOTE — Telephone Encounter (Signed)
CALLED PATIENT TO ASK ABOUT SCHEDULING ANOTHER PT APPT. FOR HER, LVM FOR A RETURN CALL

## 2016-02-02 NOTE — Assessment & Plan Note (Signed)
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  Treatment summary: 1. Neoadjuvant chemotherapy with dose dense Adriamycin and Cytoxan 4 followed by Taxol and carboplatin 12 weekly from 01/18/2015 to 06/21/2015 2. left Mastectomy With axillary lymph node dissection 08/23/2015: Invasive ductal carcinoma with calcifications grade 3, 1.3 cm, image, margins negative, 1/6 lymph nodes positive with extracapsular extension, ER 0%, 0%, T1cN1a stage IIA 3. Plan: Adjuvant XRT 10/31/15 -   Plan:  1. Adj Capecitabine for 6 months. 2. SWOG 1418 trial with Pembro vs Placebo  Xeloda Counseling: RTC in 1 month for follow up.

## 2016-02-03 ENCOUNTER — Encounter: Payer: Self-pay | Admitting: Hematology and Oncology

## 2016-02-03 ENCOUNTER — Other Ambulatory Visit: Payer: Self-pay | Admitting: Emergency Medicine

## 2016-02-03 ENCOUNTER — Other Ambulatory Visit (HOSPITAL_BASED_OUTPATIENT_CLINIC_OR_DEPARTMENT_OTHER): Payer: Medicare Other

## 2016-02-03 ENCOUNTER — Ambulatory Visit: Payer: Medicare Other

## 2016-02-03 ENCOUNTER — Ambulatory Visit (HOSPITAL_BASED_OUTPATIENT_CLINIC_OR_DEPARTMENT_OTHER): Payer: Medicare Other | Admitting: Hematology and Oncology

## 2016-02-03 DIAGNOSIS — C50411 Malignant neoplasm of upper-outer quadrant of right female breast: Secondary | ICD-10-CM | POA: Diagnosis not present

## 2016-02-03 DIAGNOSIS — C50412 Malignant neoplasm of upper-outer quadrant of left female breast: Secondary | ICD-10-CM | POA: Diagnosis present

## 2016-02-03 DIAGNOSIS — Z170 Estrogen receptor positive status [ER+]: Principal | ICD-10-CM

## 2016-02-03 DIAGNOSIS — C773 Secondary and unspecified malignant neoplasm of axilla and upper limb lymph nodes: Secondary | ICD-10-CM

## 2016-02-03 LAB — COMPREHENSIVE METABOLIC PANEL
ALT: 10 U/L (ref 0–55)
AST: 13 U/L (ref 5–34)
Albumin: 3.7 g/dL (ref 3.5–5.0)
Alkaline Phosphatase: 69 U/L (ref 40–150)
Anion Gap: 10 mEq/L (ref 3–11)
BUN: 14.8 mg/dL (ref 7.0–26.0)
CALCIUM: 9.1 mg/dL (ref 8.4–10.4)
CHLORIDE: 108 meq/L (ref 98–109)
CO2: 26 mEq/L (ref 22–29)
Creatinine: 0.8 mg/dL (ref 0.6–1.1)
EGFR: >90 mL/min/{1.73_m2} (ref >90)
GLUCOSE: 105 mg/dL (ref 70–140)
POTASSIUM: 3.9 meq/L (ref 3.5–5.1)
SODIUM: 143 meq/L (ref 136–145)
Total Bilirubin: <0.22 mg/dL (ref 0.20–1.20)
Total Protein: 6.5 g/dL (ref 6.4–8.3)

## 2016-02-03 LAB — CBC WITH DIFFERENTIAL/PLATELET
BASO%: 0.6 % (ref 0.0–2.0)
BASOS ABS: 0 10*3/uL (ref 0.0–0.1)
EOS%: 2.6 % (ref 0.0–7.0)
Eosinophils Absolute: 0.1 10*3/uL (ref 0.0–0.5)
HCT: 37.1 % (ref 34.8–46.6)
HGB: 12.5 g/dL (ref 11.6–15.9)
LYMPH%: 20.4 % (ref 14.0–49.7)
MCH: 33.1 pg (ref 25.1–34.0)
MCHC: 33.7 g/dL (ref 31.5–36.0)
MCV: 98.4 fL (ref 79.5–101.0)
MONO#: 0.4 10*3/uL (ref 0.1–0.9)
MONO%: 9.2 % (ref 0.0–14.0)
NEUT#: 3 10*3/uL (ref 1.5–6.5)
NEUT%: 67.2 % (ref 38.4–76.8)
Platelets: 213 10*3/uL (ref 145–400)
RBC: 3.77 10*6/uL (ref 3.70–5.45)
RDW: 14.1 % (ref 11.2–14.5)
WBC: 4.4 10*3/uL (ref 3.9–10.3)
lymph#: 0.9 10*3/uL (ref 0.9–3.3)

## 2016-02-03 MED ORDER — CAPECITABINE 500 MG PO TABS: 750.0000 mg/m2 | ORAL_TABLET | Freq: Two times a day (BID) | ORAL | 6 refills | Status: DC

## 2016-02-03 MED ORDER — OXYCODONE-ACETAMINOPHEN 5-325 MG PO TABS: 1.0000 | ORAL_TABLET | ORAL | 0 refills | Status: DC | PRN

## 2016-02-03 MED ORDER — SODIUM CHLORIDE 0.9 % IJ SOLN
10.0000 mL | INTRAMUSCULAR | Status: DC | PRN
  Administered 2016-02-03: 10 mL via INTRAVENOUS
  Filled 2016-02-03: qty 10

## 2016-02-03 MED ORDER — CAPECITABINE 500 MG PO TABS: 625.0000 mg/m2 | ORAL_TABLET | Freq: Two times a day (BID) | ORAL | 6 refills | Status: DC

## 2016-02-03 MED ORDER — HEPARIN SOD (PORK) LOCK FLUSH 100 UNIT/ML IV SOLN
500.0000 [IU] | Freq: Once | INTRAVENOUS | Status: AC | PRN
  Administered 2016-02-03: 500 [IU] via INTRAVENOUS
  Filled 2016-02-03: qty 5

## 2016-02-03 MED FILL — XELODA 500 MG TABLET: 500 | 14 days supply | Qty: 84 | Fill #0

## 2016-02-03 NOTE — Progress Notes (Signed)
Patient Care Team: [name], MD as PCP - General (Internal Medicine) Nicholas Lose, MD as Consulting Physician (Hematology and Oncology) Alphonsa Overall, MD as Consulting Physician (General Surgery)  DIAGNOSIS:  Encounter Diagnosis  Name Primary?  . 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      12/26/2014 Initial Diagnosis    Left breast biopsy: Invasive ductal carcinoma with lymphovascular invasion, 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, grade 3, ER/PR negative, Ki-67 80% HER-2 Neg      01/18/2015 - 06/21/2015 Neo-Adjuvant Chemotherapy    Dose dense Adriamycin and Cytoxan 4 followed by Taxol and carboplatin [id number]      07/03/2015 Breast MRI    Improvement in multiple areas of enhancement largest area 3.4 cm other suspicious nodules anterior to the mass also improved, left axillary internal mammary lymph nodes improved      08/23/2015 Surgery    Left mastectomy with axillary lymph node dissection: Invasive ductal carcinoma with calcifications grade 3, 1.3 cm, image, margins negative, 1/6 lymph nodes positive with extracapsular extension, ER 0%, 0%, T1cN1a stage II a       10/31/2015 - 12/02/2015 Radiation Therapy    Adj XRT      02/03/2016 -  Chemotherapy    Xeloda 1500 mg by mouth twice a day 2 weeks on one week off       CHIEF COMPLIANT: Patient here to discuss the pros and cons of adjuvant Xeloda  INTERVAL HISTORY: Deborah Mcintyre is a 61-year-old with above-mentioned history left breast cancer who underwent neoadjuvant chemotherapy followed by mastectomy and  radiation. She is complaining of a lot of pain in the left breast. She has seen radiation oncology who put her on gabapentin. She tells me it is not helping her. She rates the pain as 5-8. Some days it's worse on the others. She has recently noticed headaches and nausea. This is been going on for the past week. Not associated with any other neurological deficits.  REVIEW OF SYSTEMS:   Constitutional: Denies fevers, chills or abnormal weight loss Eyes: Denies blurriness of vision Ears, nose, mouth, throat, and face: Denies mucositis or sore throat Respiratory: Denies cough, dyspnea or wheezes Cardiovascular: Denies palpitation, chest discomfort Gastrointestinal:  Nausea and headache Skin: Denies abnormal skin rashes Lymphatics: Denies new lymphadenopathy or easy bruising Neurological:Denies numbness, tingling or new weaknesses Behavioral/Psych: Mood is stable, no new changes  Extremities: No lower extremity edema Breast: Left breast pain All other systems were reviewed with the patient and are negative.  I have reviewed the past medical history, past surgical history, social history and family history with the patient and they are unchanged from previous note.  ALLERGIES:  has No Known Allergies.  MEDICATIONS:  Current Outpatient Prescriptions  Medication Sig Dispense Refill  . albuterol (PROVENTIL HFA;VENTOLIN HFA) 108 (90 Base) MCG/ACT inhaler Inhale 1-2 puffs into the lungs every 6 (six) hours as needed for wheezing or shortness of breath.    [name] albuterol (PROVENTIL) (2.5 MG/3ML) 0.083% nebulizer solution Take 2.5 mg by nebulization every 6 (six) hours as needed for wheezing or  shortness of breath.    . ALPRAZolam (XANAX) 0.5 MG tablet Take 1 tablet (0.5 mg total) by mouth 3 (three) times daily as needed for anxiety or sleep. 60 tablet 0  . capecitabine (XELODA) 500 MG tablet Take 3 tablets (1,500 mg total) by mouth 2 (two) times daily after a meal. Take for 14 days and then off for 1  week 84 tablet 6  . cycloSPORINE (RESTASIS) 0.05 % ophthalmic emulsion Place 2 drops into both eyes 2 (two) times daily.    [name] gabapentin (NEURONTIN) 300 MG capsule Take 1 capsule (300 mg total) by mouth 3 (three) times daily. 90 capsule 2  . hyaluronate sodium (RADIAPLEXRX) GEL Apply 1 application topically 2 (two) times daily.    [name] ibuprofen (ADVIL,MOTRIN) 200 MG tablet Take 200 mg by mouth every 6 (six) hours as needed. Reported on 03/22/2015    . LORazepam (ATIVAN) 0.5 MG tablet Take 1 tablet (0.5 mg total) by mouth at bedtime. (Patient not taking: Reported on 01/27/2016) 30 tablet 0  . magic mouthwash w/lidocaine SOLN Take 5 mLs by mouth 2 (two) times daily as needed for mouth pain.    . metoprolol (LOPRESSOR) 50 MG tablet Take 0.5 tablets (25 mg total) by mouth 2 (two) times daily. (Patient taking differently: Take 50 mg by mouth 2 (two) times daily. )    . ondansetron (ZOFRAN) 8 MG tablet TAKE 1 TABLET TWICE DAILY AS NEEDED STARTING 3RD DAY AFTER CHEMOTHERAPY (Patient not taking: Reported on 01/27/2016) 20 tablet 1  . oxyCODONE-acetaminophen (PERCOCET/ROXICET) 5-325 MG tablet Take 1 tablet by mouth every 4 (four) hours as needed for moderate pain or severe pain (1 tab every 4-6 hour prn pain). 1 tab every 4-6 hours prn pain, dispense 60 tabs no refill 60 tablet 0  . PARoxetine (PAXIL) 20 MG tablet Take 20 mg by mouth 2 (two) times daily.     . prochlorperazine (COMPAZINE) 10 MG tablet TAKE 1 TABLET (10 MG TOTAL) BY MOUTH EVERY 6 (SIX) HOURS AS NEEDED (NAUSEA OR VOMITING). 30 tablet 1  . tetrahydrozoline 0.05 % ophthalmic solution Place 1 drop into both eyes daily as needed (dry eye).      No current facility-administered medications for this visit.     PHYSICAL EXAMINATION: ECOG PERFORMANCE STATUS: 1 - Symptomatic but completely ambulatory  Vitals:   02/03/16 1122  BP: (!) 153/88  Pulse: 82  Resp: 18  Temp: 98 F (36.7 C)   Filed Weights   02/03/16 1122  Weight: 201 lb 1.6 oz (91.2  kg)    GENERAL:alert, no distress and comfortable SKIN: skin color, texture, turgor are normal, no rashes or significant lesions EYES: normal, Conjunctiva are pink and non-injected, sclera clear OROPHARYNX:no exudate, no erythema and lips, buccal mucosa, and tongue normal  NECK: supple, thyroid normal size, non-tender, without nodularity LYMPH:  no palpable lymphadenopathy in the cervical, axillary or inguinal LUNGS: clear to auscultation and percussion with normal breathing effort HEART: regular rate & rhythm and no murmurs and no lower extremity edema ABDOMEN:abdomen soft, non-tender and normal bowel sounds MUSCULOSKELETAL:no cyanosis of digits and no clubbing  NEURO: alert & oriented x 3 with fluent speech, no focal motor/sensory deficits EXTREMITIES: No lower extremity edema  LABORATORY DATA:  I have reviewed the data as listed   Chemistry      Component Value Date/Time   NA 143 02/03/2016 1024   K 3.9 02/03/2016 1024   CL 107 08/20/2015 1356   CO2 26 02/03/2016 1024  BUN 14.8 02/03/2016 1024   CREATININE 0.8 02/03/2016 1024      Component Value Date/Time   CALCIUM 9.1 02/03/2016 1024   ALKPHOS 69 02/03/2016 1024   AST 13 02/03/2016 1024   ALT 10 02/03/2016 1024   BILITOT <0.22 02/03/2016 1024       Lab Results  Component Value Date   WBC 4.4 02/03/2016   HGB 12.5 02/03/2016   HCT 37.1 02/03/2016   MCV 98.4 02/03/2016   PLT 213 02/03/2016   NEUTROABS 3.0 02/03/2016    ASSESSMENT & PLAN:  Breast cancer of upper-outer quadrant of left female breast ([place]) Left breast biopsy12/21/16: inflammatory breast cancerInvasive ductal carcinoma with lymphovascular invasion, 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  Treatment summary: 1. Neoadjuvant chemotherapy  with dose dense Adriamycin and Cytoxan 4 followed by Taxol and carboplatin 12 weekly from 01/18/2015 to 06/21/2015 2. left Mastectomy With axillary lymph node dissection 08/23/2015: Invasive ductal carcinoma with calcifications grade 3, 1.3 cm, image, margins negative, 1/6 lymph nodes positive with extracapsular extension, ER 0%, 0%, T1cN1a stage IIA 3. Plan: Adjuvant XRT 10/31/15 -   Plan:  1. Adj Capecitabine for 6 months. 2. SWOG 1418 trial with Pembro vs Placebo  Xeloda Counseling: RTC in 3 weeks for follow up.   I spent 25 minutes talking to the patient of which more than half was spent in counseling and coordination of care.  Orders Placed This Encounter  Procedures  . CBC with Differential    Standing Status:   Future    Standing Expiration Date:   02/02/2017  . Comprehensive metabolic panel    Standing Status:   Future    Standing Expiration Date:   02/02/2017   The patient has a good understanding of the overall plan. she agrees with it. she will call with any problems that may develop before the next visit here.   Rulon Eisenmenger, MD 02/03/16

## 2016-02-03 NOTE — Patient Instructions (Signed)

## 2016-02-06 ENCOUNTER — Ambulatory Visit: Payer: Self-pay | Admitting: Radiation Oncology

## 2016-02-25 ENCOUNTER — Ambulatory Visit: Payer: Medicare Other

## 2016-02-25 ENCOUNTER — Encounter: Payer: Self-pay | Admitting: Hematology and Oncology

## 2016-02-25 ENCOUNTER — Ambulatory Visit (HOSPITAL_BASED_OUTPATIENT_CLINIC_OR_DEPARTMENT_OTHER): Payer: Medicare Other | Admitting: Hematology and Oncology

## 2016-02-25 ENCOUNTER — Other Ambulatory Visit (HOSPITAL_BASED_OUTPATIENT_CLINIC_OR_DEPARTMENT_OTHER): Payer: Medicare Other

## 2016-02-25 DIAGNOSIS — N644 Mastodynia: Secondary | ICD-10-CM

## 2016-02-25 DIAGNOSIS — C50412 Malignant neoplasm of upper-outer quadrant of left female breast: Secondary | ICD-10-CM

## 2016-02-25 DIAGNOSIS — R197 Diarrhea, unspecified: Secondary | ICD-10-CM

## 2016-02-25 DIAGNOSIS — C773 Secondary and unspecified malignant neoplasm of axilla and upper limb lymph nodes: Secondary | ICD-10-CM

## 2016-02-25 DIAGNOSIS — Z170 Estrogen receptor positive status [ER+]: Secondary | ICD-10-CM | POA: Diagnosis not present

## 2016-02-25 LAB — CBC WITH DIFFERENTIAL/PLATELET
BASO%: 0.3 % (ref 0.0–2.0)
Basophils Absolute: 0 10*3/uL (ref 0.0–0.1)
EOS ABS: 0.1 10*3/uL (ref 0.0–0.5)
EOS%: 2.3 % (ref 0.0–7.0)
HEMATOCRIT: 36.8 % (ref 34.8–46.6)
HGB: 12.4 g/dL (ref 11.6–15.9)
LYMPH#: 0.9 10*3/uL (ref 0.9–3.3)
LYMPH%: 21.6 % (ref 14.0–49.7)
MCH: 33.8 pg (ref 25.1–34.0)
MCHC: 33.8 g/dL (ref 31.5–36.0)
MCV: 99.9 fL (ref 79.5–101.0)
MONO#: 0.4 10*3/uL (ref 0.1–0.9)
MONO%: 9.8 % (ref 0.0–14.0)
NEUT#: 2.7 10*3/uL (ref 1.5–6.5)
NEUT%: 66 % (ref 38.4–76.8)
PLATELETS: 158 10*3/uL (ref 145–400)
RBC: 3.68 10*6/uL — ABNORMAL LOW (ref 3.70–5.45)
RDW: 14.5 % (ref 11.2–14.5)
WBC: 4.1 10*3/uL (ref 3.9–10.3)

## 2016-02-25 LAB — COMPREHENSIVE METABOLIC PANEL
ALT: 9 U/L (ref 0–55)
AST: 12 U/L (ref 5–34)
Albumin: 3.8 g/dL (ref 3.5–5.0)
Alkaline Phosphatase: 64 U/L (ref 40–150)
Anion Gap: 9 mEq/L (ref 3–11)
BILIRUBIN TOTAL: 0.23 mg/dL (ref 0.20–1.20)
BUN: 16 mg/dL (ref 7.0–26.0)
CHLORIDE: 108 meq/L (ref 98–109)
CO2: 25 mEq/L (ref 22–29)
CREATININE: 0.8 mg/dL (ref 0.6–1.1)
Calcium: 9.2 mg/dL (ref 8.4–10.4)
EGFR: >90 mL/min/{1.73_m2} (ref >90)
Glucose: 98 mg/dl (ref 70–140)
Potassium: 3.7 mEq/L (ref 3.5–5.1)
Sodium: 142 mEq/L (ref 136–145)
Total Protein: 6.6 g/dL (ref 6.4–8.3)

## 2016-02-25 MED ORDER — SODIUM CHLORIDE 0.9 % IJ SOLN
10.0000 mL | INTRAMUSCULAR | Status: DC | PRN
  Administered 2016-02-25: 10 mL via INTRAVENOUS
  Filled 2016-02-25: qty 10

## 2016-02-25 MED ORDER — HEPARIN SOD (PORK) LOCK FLUSH 100 UNIT/ML IV SOLN
500.0000 [IU] | Freq: Once | INTRAVENOUS | Status: AC | PRN
  Administered 2016-02-25: 500 [IU] via INTRAVENOUS
  Filled 2016-02-25: qty 5

## 2016-02-25 MED ORDER — OXYCODONE-ACETAMINOPHEN 5-325 MG PO TABS: 1.0000 | ORAL_TABLET | ORAL | 0 refills | Status: DC | PRN

## 2016-02-25 NOTE — Assessment & Plan Note (Signed)
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  Treatment summary: 1. Neoadjuvant chemotherapy with dose dense Adriamycin and Cytoxan 4 followed by Taxol and carboplatin 12 weekly from 01/18/2015 to 06/21/2015 2. left Mastectomy With axillary lymph node dissection 08/23/2015: Invasive ductal carcinoma with calcifications grade 3, 1.3 cm, image, margins negative, 1/6 lymph nodes positive with extracapsular extension, ER 0%, 0%, T1cN1a stage IIA 3. Plan: Adjuvant XRT 10/31/15 - 01/08/2016  Plan:  1. Adj Capecitabine for 6 months. 2. SWOG 1418 trial with Pembro vs Placebo  Xeloda Counseling: Patient understands risks and benefits of Xeloda. RTC in one month  for follow up.

## 2016-02-25 NOTE — Progress Notes (Signed)
Patient Care Team: [name], MD as PCP - General (Internal Medicine) Nicholas Lose, MD as Consulting Physician (Hematology and Oncology) Alphonsa Overall, MD as Consulting Physician (General Surgery)  DIAGNOSIS:  Encounter Diagnosis  Name Primary?  . 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      12/26/2014 Initial Diagnosis    Left breast biopsy: Invasive ductal carcinoma with lymphovascular invasion, 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, grade 3, ER/PR negative, Ki-67 80% HER-2 Neg      01/18/2015 - 06/21/2015 Neo-Adjuvant Chemotherapy    Dose dense Adriamycin and Cytoxan 4 followed by Taxol and carboplatin [id number]      07/03/2015 Breast MRI    Improvement in multiple areas of enhancement largest area 3.4 cm other suspicious nodules anterior to the mass also improved, left axillary internal mammary lymph nodes improved      08/23/2015 Surgery    Left mastectomy with axillary lymph node dissection: Invasive ductal carcinoma with calcifications grade 3, 1.3 cm, image, margins negative, 1/6 lymph nodes positive with extracapsular extension, ER 0%, 0%, T1cN1a stage II a       10/31/2015 - 01/08/2016 Radiation Therapy    Adj XRT      02/03/2016 -  Chemotherapy    Xeloda 1500 mg by mouth twice a day 2 weeks on one week off       CHIEF COMPLIANT: Follow-up on Xeloda  INTERVAL HISTORY: Deborah Mcintyre is a 61 year old with above-mentioned history of left breast cancer currently on oral Xeloda adjuvant chemotherapy. She is tolerating Xeloda extremely well. She completed first 2  weeks of treatment. Denies any nausea vomiting. She does have occasional loose stools but denies any diarrhea. She does not complain of any fatigue. Denies any hand-foot syndrome. He continues to have intermittent pain in the breasts and requesting medication.  REVIEW OF SYSTEMS:   Constitutional: Denies fevers, chills or abnormal weight loss Eyes: Denies blurriness of vision Ears, nose, mouth, throat, and face: Denies mucositis or sore throat Respiratory: Denies cough, dyspnea or wheezes Cardiovascular: Denies palpitation, chest discomfort Gastrointestinal:  Denies nausea, heartburn or change in bowel habits Skin: Denies abnormal skin rashes Lymphatics: Denies new lymphadenopathy or easy bruising Neurological:Denies numbness, tingling or new weaknesses Behavioral/Psych: Mood is stable, no new changes  Extremities: No lower extremity edema Breast:  Pain in the breast All other systems were reviewed with the patient and are negative.  I have reviewed the past medical history, past surgical history, social history and family history with the patient and they are unchanged from previous note.  ALLERGIES:  has No Known Allergies.  MEDICATIONS:  Current Outpatient Prescriptions  Medication Sig Dispense Refill  . albuterol (PROVENTIL HFA;VENTOLIN HFA) 108 (90 Base) MCG/ACT inhaler Inhale 1-2 puffs into the lungs every 6 (six) hours as needed for wheezing or shortness of breath.    [name] albuterol (PROVENTIL) (2.5 MG/3ML) 0.083% nebulizer solution Take 2.5 mg by nebulization every 6 (six) hours as needed for wheezing or shortness of breath.    . ALPRAZolam (XANAX) 0.5 MG tablet Take 1 tablet (0.5 mg total) by  mouth 3 (three) times daily as needed for anxiety or sleep. 60 tablet 0  . capecitabine (XELODA) 500 MG tablet Take 3 tablets (1,500 mg total) by mouth 2 (two) times daily after a meal. Take for 14 days and then off for 1 week 84 tablet 6  . cycloSPORINE (RESTASIS) 0.05 % ophthalmic emulsion  Place 2 drops into both eyes 2 (two) times daily.    [name] gabapentin (NEURONTIN) 300 MG capsule Take 1 capsule (300 mg total) by mouth 3 (three) times daily. 90 capsule 2  . ibuprofen (ADVIL,MOTRIN) 200 MG tablet Take 200 mg by mouth every 6 (six) hours as needed. Reported on 03/22/2015    . magic mouthwash w/lidocaine SOLN Take 5 mLs by mouth 2 (two) times daily as needed for mouth pain.    . metoprolol (LOPRESSOR) 50 MG tablet Take 0.5 tablets (25 mg total) by mouth 2 (two) times daily. (Patient taking differently: Take 50 mg by mouth 2 (two) times daily. )    . oxyCODONE-acetaminophen (PERCOCET/ROXICET) 5-325 MG tablet Take 1 tablet by mouth every 4 (four) hours as needed for moderate pain or severe pain (1 tab every 4-6 hour prn pain). 1 tab every 4-6 hours prn pain, dispense 60 tabs no refill 60 tablet 0  . PARoxetine (PAXIL) 20 MG tablet Take 20 mg by mouth 2 (two) times daily.      No current facility-administered medications for this visit.     PHYSICAL EXAMINATION: ECOG PERFORMANCE STATUS: 1 - Symptomatic but completely ambulatory  Vitals:   02/25/16 1353  BP: (!) 119/104  Pulse: 96  Resp: 18  Temp: 97.8 F (36.6 C)   Filed Weights   02/25/16 1353  Weight: 204 lb 6.4 oz (92.7 kg)    GENERAL:alert, no distress and comfortable SKIN: skin color, texture, turgor are normal, no rashes or significant lesions EYES: normal, Conjunctiva are pink and non-injected, sclera clear OROPHARYNX:no exudate, no erythema and lips, buccal mucosa, and tongue normal  NECK: supple, thyroid normal size, non-tender, without nodularity LYMPH:  no palpable lymphadenopathy in the cervical, axillary or inguinal LUNGS: clear to auscultation and percussion with normal breathing effort HEART: regular rate & rhythm and no murmurs and no lower extremity edema ABDOMEN:abdomen soft, non-tender and normal bowel sounds MUSCULOSKELETAL:no cyanosis of digits and no clubbing  NEURO: alert & oriented x 3 with fluent  speech, no focal motor/sensory deficits EXTREMITIES: No lower extremity edema   LABORATORY DATA:  I have reviewed the data as listed   Chemistry      Component Value Date/Time   NA 143 02/03/2016 1024   K 3.9 02/03/2016 1024   CL 107 08/20/2015 1356   CO2 26 02/03/2016 1024   BUN 14.8 02/03/2016 1024   CREATININE 0.8 02/03/2016 1024      Component Value Date/Time   CALCIUM 9.1 02/03/2016 1024   ALKPHOS 69 02/03/2016 1024   AST 13 02/03/2016 1024   ALT 10 02/03/2016 1024   BILITOT <0.22 02/03/2016 1024       Lab Results  Component Value Date   WBC 4.1 02/25/2016   HGB 12.4 02/25/2016   HCT 36.8 02/25/2016   MCV 99.9 02/25/2016   PLT 158 02/25/2016   NEUTROABS 2.7 02/25/2016    ASSESSMENT & PLAN:  Breast cancer of upper-outer quadrant of left female breast ([place]) Left breast biopsy12/21/16: inflammatory breast cancerInvasive ductal carcinoma with lymphovascular invasion, 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, grade 3, ER/PR negative, Her 2 Negative Left breast  palpable mass at 2:00: 4 x 3.8 x 2.5 cm, enlarged left axillary lymph node 4 cm in size with diffuse skin thickening T4 N1 (Stage 3B).  Treatment summary: 1. Neoadjuvant chemotherapy with dose dense Adriamycin and Cytoxan 4 followed by Taxol and carboplatin 12 weekly from 01/18/2015 to 06/21/2015 2. left Mastectomy With axillary lymph node dissection 08/23/2015: Invasive ductal carcinoma with calcifications grade 3, 1.3 cm, image, margins negative, 1/6 lymph nodes positive with extracapsular extension, ER 0%, 0%, T1cN1a stage IIA 3. Plan: Adjuvant XRT 10/31/15 - 01/08/2016  Plan:  1. Adj Capecitabine for 6 months Started 02/05/2016.  2. SWOG 1418 trial with Pembro vs Placebo  Xeloda toxicities: 1. Occasional loose stools 2. mild nausea No evidence of hand-foot syndrome.   RTC in 6 weeks for follow up.  I spent 25 minutes talking to the patient of which more than half was  spent in counseling and coordination of care.  Orders Placed This Encounter  Procedures  . CBC with Differential    Standing Status:   Future    Standing Expiration Date:   02/24/2017  . Comprehensive metabolic panel    Standing Status:   Future    Standing Expiration Date:   02/24/2017   The patient has a good understanding of the overall plan. she agrees with it. she will call with any problems that may develop before the next visit here.   Rulon Eisenmenger, MD 02/25/16

## 2016-02-28 ENCOUNTER — Other Ambulatory Visit: Payer: Self-pay | Admitting: Hematology and Oncology

## 2016-03-17 MED FILL — XELODA 500 MG TABLET: 500 | 28 days supply | Qty: 84 | Fill #0

## 2016-04-13 ENCOUNTER — Ambulatory Visit (HOSPITAL_BASED_OUTPATIENT_CLINIC_OR_DEPARTMENT_OTHER): Payer: Medicare Other | Admitting: Hematology and Oncology

## 2016-04-13 ENCOUNTER — Other Ambulatory Visit (HOSPITAL_BASED_OUTPATIENT_CLINIC_OR_DEPARTMENT_OTHER): Payer: Medicare Other

## 2016-04-13 ENCOUNTER — Encounter: Payer: Self-pay | Admitting: Hematology and Oncology

## 2016-04-13 ENCOUNTER — Ambulatory Visit: Payer: Medicare Other

## 2016-04-13 ENCOUNTER — Telehealth: Payer: Self-pay | Admitting: Hematology and Oncology

## 2016-04-13 DIAGNOSIS — R110 Nausea: Secondary | ICD-10-CM

## 2016-04-13 DIAGNOSIS — C50412 Malignant neoplasm of upper-outer quadrant of left female breast: Secondary | ICD-10-CM

## 2016-04-13 DIAGNOSIS — C773 Secondary and unspecified malignant neoplasm of axilla and upper limb lymph nodes: Secondary | ICD-10-CM

## 2016-04-13 DIAGNOSIS — Z170 Estrogen receptor positive status [ER+]: Principal | ICD-10-CM

## 2016-04-13 LAB — COMPREHENSIVE METABOLIC PANEL
ALBUMIN: 3.8 g/dL (ref 3.5–5.0)
ALK PHOS: 62 U/L (ref 40–150)
ALT: 12 U/L (ref 0–55)
ANION GAP: 9 meq/L (ref 3–11)
AST: 14 U/L (ref 5–34)
BUN: 12.4 mg/dL (ref 7.0–26.0)
CALCIUM: 9.3 mg/dL (ref 8.4–10.4)
CO2: 26 mEq/L (ref 22–29)
CREATININE: 0.7 mg/dL (ref 0.6–1.1)
Chloride: 108 mEq/L (ref 98–109)
EGFR: >90 mL/min/{1.73_m2} (ref >90)
Glucose: 101 mg/dl (ref 70–140)
Potassium: 4 mEq/L (ref 3.5–5.1)
Sodium: 143 mEq/L (ref 136–145)
Total Bilirubin: 0.42 mg/dL (ref 0.20–1.20)
Total Protein: 6.5 g/dL (ref 6.4–8.3)

## 2016-04-13 LAB — CBC WITH DIFFERENTIAL/PLATELET
BASO%: 0.5 % (ref 0.0–2.0)
BASOS ABS: 0 10*3/uL (ref 0.0–0.1)
EOS%: 2.9 % (ref 0.0–7.0)
Eosinophils Absolute: 0.1 10*3/uL (ref 0.0–0.5)
HEMATOCRIT: 37.3 % (ref 34.8–46.6)
HEMOGLOBIN: 12.3 g/dL (ref 11.6–15.9)
LYMPH#: 0.9 10*3/uL (ref 0.9–3.3)
LYMPH%: 23.3 % (ref 14.0–49.7)
MCH: 33.8 pg (ref 25.1–34.0)
MCHC: 33 g/dL (ref 31.5–36.0)
MCV: 102.5 fL — ABNORMAL HIGH (ref 79.5–101.0)
MONO#: 0.3 10*3/uL (ref 0.1–0.9)
MONO%: 8.7 % (ref 0.0–14.0)
NEUT#: 2.4 10*3/uL (ref 1.5–6.5)
NEUT%: 64.6 % (ref 38.4–76.8)
PLATELETS: 178 10*3/uL (ref 145–400)
RBC: 3.64 10*6/uL — ABNORMAL LOW (ref 3.70–5.45)
RDW: 14.6 % — AB (ref 11.2–14.5)
WBC: 3.8 10*3/uL — ABNORMAL LOW (ref 3.9–10.3)

## 2016-04-13 MED ORDER — ESTROGENS, CONJUGATED 0.625 MG/GM VA CREA: 1.0000 | TOPICAL_CREAM | Freq: Every day | VAGINAL | 12 refills | Status: DC

## 2016-04-13 MED ORDER — SODIUM CHLORIDE 0.9 % IJ SOLN
10.0000 mL | INTRAMUSCULAR | Status: DC | PRN
  Administered 2016-04-13: 10 mL via INTRAVENOUS
  Filled 2016-04-13: qty 10

## 2016-04-13 MED ORDER — HEPARIN SOD (PORK) LOCK FLUSH 100 UNIT/ML IV SOLN
500.0000 [IU] | Freq: Once | INTRAVENOUS | Status: AC | PRN
  Administered 2016-04-13: 500 [IU] via INTRAVENOUS
  Filled 2016-04-13: qty 5

## 2016-04-13 MED ORDER — CAPECITABINE 500 MG PO TABS: ORAL_TABLET | ORAL | 3 refills | Status: DC

## 2016-04-13 MED ORDER — OXYCODONE-ACETAMINOPHEN 5-325 MG PO TABS: 1.0000 | ORAL_TABLET | ORAL | 0 refills | Status: DC | PRN

## 2016-04-13 MED ORDER — ONDANSETRON HCL 8 MG PO TABS: 8.0000 mg | ORAL_TABLET | Freq: Three times a day (TID) | ORAL | 3 refills | Status: DC | PRN

## 2016-04-13 MED FILL — ONDANSETRON HCL 8 MG TABLET: 8 | 10 days supply | Qty: 30 | Fill #0

## 2016-04-13 MED FILL — OXYCODONE/APAP 5/325 MG TAB: 5-325 | 10 days supply | Qty: 60 | Fill #0

## 2016-04-13 MED FILL — PREMARIN VAGINAL CREAM-APPL: 0.625 | 15 days supply | Qty: 30 | Fill #0

## 2016-04-13 NOTE — Patient Instructions (Signed)
Implanted Port Home Guide An implanted port is a type of central line that is placed under the skin. Central lines are used to provide IV access when treatment or nutrition needs to be given through a person's veins. Implanted ports are used for long-term IV access. An implanted port may be placed because:  You need IV medicine that would be irritating to the small veins in your hands or arms.  You need long-term IV medicines, such as antibiotics.  You need IV nutrition for a long period.  You need frequent blood draws for lab tests.  You need dialysis.  Implanted ports are usually placed in the chest area, but they can also be placed in the upper arm, the abdomen, or the leg. An implanted port has two main parts:  Reservoir. The reservoir is round and will appear as a small, raised area under your skin. The reservoir is the part where a needle is inserted to give medicines or draw blood.  Catheter. The catheter is a thin, flexible tube that extends from the reservoir. The catheter is placed into a large vein. Medicine that is inserted into the reservoir goes into the catheter and then into the vein.  How will I care for my incision site? Do not get the incision site wet. Bathe or shower as directed by your health care provider. How is my port accessed? Special steps must be taken to access the port:  Before the port is accessed, a numbing cream can be placed on the skin. This helps numb the skin over the port site.  Your health care provider uses a sterile technique to access the port. ? Your health care provider must put on a mask and sterile gloves. ? The skin over your port is cleaned carefully with an antiseptic and allowed to dry. ? The port is gently pinched between sterile gloves, and a needle is inserted into the port.  Only "non-coring" port needles should be used to access the port. Once the port is accessed, a blood return should be checked. This helps ensure that the port  is in the vein and is not clogged.  If your port needs to remain accessed for a constant infusion, a clear (transparent) bandage will be placed over the needle site. The bandage and needle will need to be changed every week, or as directed by your health care provider.  Keep the bandage covering the needle clean and dry. Do not get it wet. Follow your health care provider's instructions on how to take a shower or bath while the port is accessed.  If your port does not need to stay accessed, no bandage is needed over the port.  What is flushing? Flushing helps keep the port from getting clogged. Follow your health care provider's instructions on how and when to flush the port. Ports are usually flushed with saline solution or a medicine called heparin. The need for flushing will depend on how the port is used.  If the port is used for intermittent medicines or blood draws, the port will need to be flushed: ? After medicines have been given. ? After blood has been drawn. ? As part of routine maintenance.  If a constant infusion is running, the port may not need to be flushed.  How long will my port stay implanted? The port can stay in for as long as your health care provider thinks it is needed. When it is time for the port to come out, surgery will be   done to remove it. The procedure is similar to the one performed when the port was put in. When should I seek immediate medical care? When you have an implanted port, you should seek immediate medical care if:  You notice a bad smell coming from the incision site.  You have swelling, redness, or drainage at the incision site.  You have more swelling or pain at the port site or the surrounding area.  You have a fever that is not controlled with medicine.  This information is not intended to replace advice given to you by your health care provider. Make sure you discuss any questions you have with your health care provider. Document  Released: 12/22/2004 Document Revised: 05/30/2015 Document Reviewed: 08/29/2012 [employer] Interactive Patient Education  2017 [employer]  

## 2016-04-13 NOTE — Progress Notes (Signed)
Patient Care Team: [name], MD as PCP - General (Internal Medicine) Nicholas Lose, MD as Consulting Physician (Hematology and Oncology) Alphonsa Overall, MD as Consulting Physician (General Surgery)  DIAGNOSIS:  Encounter Diagnosis  Name Primary?  . 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      12/26/2014 Initial Diagnosis    Left breast biopsy: Invasive ductal carcinoma with lymphovascular invasion, 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, grade 3, ER/PR negative, Ki-67 80% HER-2 Neg      01/18/2015 - 06/21/2015 Neo-Adjuvant Chemotherapy    Dose dense Adriamycin and Cytoxan 4 followed by Taxol and carboplatin [id number]      07/03/2015 Breast MRI    Improvement in multiple areas of enhancement largest area 3.4 cm other suspicious nodules anterior to the mass also improved, left axillary internal mammary lymph nodes improved      08/23/2015 Surgery    Left mastectomy with axillary lymph node dissection: Invasive ductal carcinoma with calcifications grade 3, 1.3 cm, image, margins negative, 1/6 lymph nodes positive with extracapsular extension, ER 0%, 0%, T1cN1a stage II a       10/31/2015 - 01/08/2016 Radiation Therapy    Adj XRT      02/03/2016 -  Chemotherapy    Xeloda 1500 mg by mouth twice a day 2 weeks on one week off       CHIEF COMPLIANT: Follow-up on oral Xeloda  INTERVAL HISTORY: Deborah Mcintyre is a 61 year old with above-mentioned history left breast cancer triple negative disease underwent neoadjuvant chemotherapy followed by mastectomy and radiation is currently on  adjuvant Xeloda. She is tolerating Xeloda moderately well. She did have nausea and did not have any nausea medication. She did not call us about it either. She denies any diarrhea or hand-foot syndrome.  REVIEW OF SYSTEMS:   Constitutional: Denies fevers, chills or abnormal weight loss Eyes: Denies blurriness of vision Ears, nose, mouth, throat, and face: Denies mucositis or sore throat Respiratory: Denies cough, dyspnea or wheezes Cardiovascular: Denies palpitation, chest discomfort Gastrointestinal:  Complains of nausea Skin: Denies abnormal skin rashes Lymphatics: Denies new lymphadenopathy or easy bruising Neurological:Denies numbness, tingling or new weaknesses Behavioral/Psych: Mood is stable, no new changes  Extremities: No lower extremity edema All other systems were reviewed with the patient and are negative.  I have reviewed the past medical history, past surgical history, social history and family history with the patient and they are unchanged from previous note.  ALLERGIES:  has No Known Allergies.  MEDICATIONS:  Current Outpatient Prescriptions  Medication Sig Dispense Refill  . albuterol (PROVENTIL HFA;VENTOLIN HFA) 108 (90 Base) MCG/ACT inhaler Inhale 1-2 puffs into the lungs every 6 (six) hours as needed for wheezing or shortness of breath.    [name] albuterol (PROVENTIL) (2.5 MG/3ML) 0.083% nebulizer solution Take 2.5 mg by nebulization every 6 (six) hours as needed for wheezing or shortness of breath.    . ALPRAZolam (XANAX) 0.5 MG tablet Take 1 tablet (0.5 mg total) by mouth 3 (three) times daily as needed for anxiety or sleep. 60 tablet 0  . capecitabine (XELODA) 500  MG tablet TAKE 3 TABLETS BY MOUTH TWICE A DAY AFTER A MEAL. TAKE FOR 14 DAYS AND THEN OFF FOR 1 WEEK 84 tablet 3  . conjugated estrogens (PREMARIN) vaginal cream Place 1 Applicatorful vaginally daily. 42.5 g 12  . cycloSPORINE (RESTASIS) 0.05 % ophthalmic emulsion Place 2 drops into both eyes 2 (two) times  daily.    [name] gabapentin (NEURONTIN) 300 MG capsule Take 1 capsule (300 mg total) by mouth 3 (three) times daily. 90 capsule 2  . ibuprofen (ADVIL,MOTRIN) 200 MG tablet Take 200 mg by mouth every 6 (six) hours as needed. Reported on 03/22/2015    . magic mouthwash w/lidocaine SOLN Take 5 mLs by mouth 2 (two) times daily as needed for mouth pain.    . metoprolol (LOPRESSOR) 50 MG tablet Take 0.5 tablets (25 mg total) by mouth 2 (two) times daily. (Patient taking differently: Take 50 mg by mouth 2 (two) times daily. )    . ondansetron (ZOFRAN) 8 MG tablet Take 1 tablet (8 mg total) by mouth every 8 (eight) hours as needed for nausea. 30 tablet 3  . oxyCODONE-acetaminophen (PERCOCET/ROXICET) 5-325 MG tablet Take 1 tablet by mouth every 4 (four) hours as needed for moderate pain or severe pain (1 tab every 4-6 hour prn pain). 1 tab every 4-6 hours prn pain, dispense 60 tabs no refill 60 tablet 0  . PARoxetine (PAXIL) 20 MG tablet Take 20 mg by mouth 2 (two) times daily.      No current facility-administered medications for this visit.     PHYSICAL EXAMINATION: ECOG PERFORMANCE STATUS: 1 - Symptomatic but completely ambulatory  Vitals:   04/13/16 1437  BP: (!) 162/95  Pulse: 95  Resp: 18  Temp: 97.9 F (36.6 C)   Filed Weights   04/13/16 1437  Weight: 204 lb 11.2 oz (92.9 kg)    GENERAL:alert, no distress and comfortable SKIN: skin color, texture, turgor are normal, no rashes or significant lesions EYES: normal, Conjunctiva are pink and non-injected, sclera clear OROPHARYNX:no exudate, no erythema and lips, buccal mucosa, and tongue normal  NECK: supple, thyroid normal size, non-tender, without nodularity LYMPH:  no palpable lymphadenopathy in the cervical, axillary or inguinal LUNGS: clear to auscultation and percussion with normal breathing effort HEART: regular rate & rhythm and no murmurs and no lower extremity edema ABDOMEN:abdomen soft, non-tender and normal bowel  sounds MUSCULOSKELETAL:no cyanosis of digits and no clubbing  NEURO: alert & oriented x 3 with fluent speech, no focal motor/sensory deficits EXTREMITIES: No lower extremity edema  LABORATORY DATA:  I have reviewed the data as listed   Chemistry      Component Value Date/Time   NA 143 04/13/2016 1350   K 4.0 04/13/2016 1350   CL 107 08/20/2015 1356   CO2 26 04/13/2016 1350   BUN 12.4 04/13/2016 1350   CREATININE 0.7 04/13/2016 1350      Component Value Date/Time   CALCIUM 9.3 04/13/2016 1350   ALKPHOS 62 04/13/2016 1350   AST 14 04/13/2016 1350   ALT 12 04/13/2016 1350   BILITOT 0.42 04/13/2016 1350       Lab Results  Component Value Date   WBC 3.8 (L) 04/13/2016   HGB 12.3 04/13/2016   HCT 37.3 04/13/2016   MCV 102.5 (H) 04/13/2016   PLT 178 04/13/2016   NEUTROABS 2.4 04/13/2016    ASSESSMENT & PLAN:  Breast cancer of upper-outer quadrant of left female breast ([place]) Left breast biopsy12/21/16: inflammatory breast cancerInvasive ductal carcinoma with lymphovascular  invasion, 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  Treatment summary: 1. Neoadjuvant chemotherapy with dose dense Adriamycin and Cytoxan 4 followed by Taxol and carboplatin 12 weekly from 01/18/2015 to 06/21/2015 2. left Mastectomy With axillary lymph node dissection 08/23/2015: Invasive ductal carcinoma with calcifications grade 3, 1.3 cm, image, margins negative, 1/6 lymph nodes positive with extracapsular extension, ER 0%, 0%, T1cN1a stage IIA 3. Plan: Adjuvant XRT 10/31/15 - 01/08/2016  Plan:  1. Adj Capecitabine for 6 months Started 02/05/2016.  2. SWOG 1418 trial with Pembro vs Placebo  Xeloda toxicities: 1. Occasional loose stools 2. mild nausea No evidence of hand-foot syndrome.    RTC in 6 weeks for follow up.   I spent 25 minutes talking to the patient of which more than half was spent in counseling and coordination of care.  No orders of the defined types were placed in this encounter.  The patient has a good understanding of the overall plan. she agrees with it. she will call with any problems that may develop before the next visit here.   Rulon Eisenmenger, MD 04/13/16

## 2016-04-13 NOTE — Assessment & Plan Note (Signed)
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  Treatment summary: 1. Neoadjuvant chemotherapy with dose dense Adriamycin and Cytoxan 4 followed by Taxol and carboplatin 12 weekly from 01/18/2015 to 06/21/2015 2. left Mastectomy With axillary lymph node dissection 08/23/2015: Invasive ductal carcinoma with calcifications grade 3, 1.3 cm, image, margins negative, 1/6 lymph nodes positive with extracapsular extension, ER 0%, 0%, T1cN1a stage IIA 3. Plan: Adjuvant XRT 10/31/15 - 01/08/2016  Plan:  1. Adj Capecitabine for 6 months Started 02/05/2016.  2. SWOG 1418 trial with Pembro vs Placebo  Xeloda toxicities: 1. Occasional loose stools 2. mild nausea No evidence of hand-foot syndrome.   RTC in 6 weeks for follow up.

## 2016-04-13 NOTE — Telephone Encounter (Signed)
Gave patient avs report and appointments for May and June ([facility]).

## 2016-04-14 ENCOUNTER — Other Ambulatory Visit: Payer: Self-pay

## 2016-04-15 ENCOUNTER — Telehealth: Payer: Self-pay | Admitting: Pharmacist

## 2016-04-15 DIAGNOSIS — C50412 Malignant neoplasm of upper-outer quadrant of left female breast: Secondary | ICD-10-CM

## 2016-04-15 MED ORDER — CAPECITABINE 500 MG PO TABS: ORAL_TABLET | ORAL | 3 refills | Status: DC

## 2016-04-15 MED FILL — XELODA 500 MG TABLET: 500 | 21 days supply | Qty: 84 | Fill #0

## 2016-04-15 NOTE — Telephone Encounter (Signed)
Oral Chemotherapy Pharmacist Encounter  Received notification from [facility] that they would need Xeloda prescription re-sent with an ICD-10 code on it. Noted MD e-scribed Xeloda on 04/13/16 Labs from 04/13/16 reviewed, OK for treatment Current medication list in Epic assessed, no significant DDIs with Xeloda identified  Patient prescribed Xeloda for breast cancer, ICD-10 C50.412, as adjuvant treatment Start date: 02/05/2016  I will re-send prescription with ICD-10  Johny Drilling, PharmD, BCPS, BCOP 04/15/2016  10:21 AM [facility] [phone]

## 2016-05-22 ENCOUNTER — Other Ambulatory Visit: Payer: Self-pay | Admitting: Emergency Medicine

## 2016-05-22 DIAGNOSIS — C50412 Malignant neoplasm of upper-outer quadrant of left female breast: Secondary | ICD-10-CM

## 2016-05-25 ENCOUNTER — Other Ambulatory Visit (HOSPITAL_BASED_OUTPATIENT_CLINIC_OR_DEPARTMENT_OTHER): Payer: Medicare Other

## 2016-05-25 ENCOUNTER — Ambulatory Visit (HOSPITAL_BASED_OUTPATIENT_CLINIC_OR_DEPARTMENT_OTHER): Payer: Medicare Other | Admitting: Hematology and Oncology

## 2016-05-25 ENCOUNTER — Encounter: Payer: Self-pay | Admitting: Hematology and Oncology

## 2016-05-25 ENCOUNTER — Ambulatory Visit: Payer: Medicare Other

## 2016-05-25 DIAGNOSIS — R197 Diarrhea, unspecified: Secondary | ICD-10-CM

## 2016-05-25 DIAGNOSIS — C773 Secondary and unspecified malignant neoplasm of axilla and upper limb lymph nodes: Secondary | ICD-10-CM | POA: Diagnosis not present

## 2016-05-25 DIAGNOSIS — C50412 Malignant neoplasm of upper-outer quadrant of left female breast: Secondary | ICD-10-CM | POA: Diagnosis present

## 2016-05-25 DIAGNOSIS — R110 Nausea: Secondary | ICD-10-CM

## 2016-05-25 DIAGNOSIS — Z171 Estrogen receptor negative status [ER-]: Secondary | ICD-10-CM | POA: Diagnosis not present

## 2016-05-25 DIAGNOSIS — Z170 Estrogen receptor positive status [ER+]: Principal | ICD-10-CM

## 2016-05-25 LAB — CBC WITH DIFFERENTIAL/PLATELET
BASO%: 0.6 % (ref 0.0–2.0)
BASOS ABS: 0 10*3/uL (ref 0.0–0.1)
EOS ABS: 0.1 10*3/uL (ref 0.0–0.5)
EOS%: 2.5 % (ref 0.0–7.0)
HCT: 40.2 % (ref 34.8–46.6)
HEMOGLOBIN: 13.5 g/dL (ref 11.6–15.9)
LYMPH%: 25.1 % (ref 14.0–49.7)
MCH: 35 pg — AB (ref 25.1–34.0)
MCHC: 33.7 g/dL (ref 31.5–36.0)
MCV: 103.8 fL — AB (ref 79.5–101.0)
MONO#: 0.4 10*3/uL (ref 0.1–0.9)
MONO%: 9.7 % (ref 0.0–14.0)
NEUT#: 2.5 10*3/uL (ref 1.5–6.5)
NEUT%: 62.1 % (ref 38.4–76.8)
Platelets: 208 10*3/uL (ref 145–400)
RBC: 3.87 10*6/uL (ref 3.70–5.45)
RDW: 14.8 % — ABNORMAL HIGH (ref 11.2–14.5)
WBC: 4 10*3/uL (ref 3.9–10.3)
lymph#: 1 10*3/uL (ref 0.9–3.3)

## 2016-05-25 LAB — COMPREHENSIVE METABOLIC PANEL
ALBUMIN: 4 g/dL (ref 3.5–5.0)
ALK PHOS: 65 U/L (ref 40–150)
ALT: 8 U/L (ref 0–55)
AST: 14 U/L (ref 5–34)
Anion Gap: 7 mEq/L (ref 3–11)
BUN: 10.8 mg/dL (ref 7.0–26.0)
CHLORIDE: 109 meq/L (ref 98–109)
CO2: 27 mEq/L (ref 22–29)
Calcium: 9.3 mg/dL (ref 8.4–10.4)
Creatinine: 0.8 mg/dL (ref 0.6–1.1)
GLUCOSE: 95 mg/dL (ref 70–140)
POTASSIUM: 3.7 meq/L (ref 3.5–5.1)
SODIUM: 143 meq/L (ref 136–145)
Total Bilirubin: 0.5 mg/dL (ref 0.20–1.20)
Total Protein: 6.8 g/dL (ref 6.4–8.3)

## 2016-05-25 MED ORDER — GABAPENTIN 300 MG PO CAPS: 300.0000 mg | ORAL_CAPSULE | Freq: Three times a day (TID) | ORAL | 2 refills | Status: DC

## 2016-05-25 MED ORDER — SODIUM CHLORIDE 0.9 % IJ SOLN
10.0000 mL | INTRAMUSCULAR | Status: DC | PRN
  Administered 2016-05-25: 10 mL via INTRAVENOUS
  Filled 2016-05-25: qty 10

## 2016-05-25 MED ORDER — HEPARIN SOD (PORK) LOCK FLUSH 100 UNIT/ML IV SOLN
500.0000 [IU] | Freq: Once | INTRAVENOUS | Status: AC | PRN
  Administered 2016-05-25: 500 [IU] via INTRAVENOUS
  Filled 2016-05-25: qty 5

## 2016-05-25 MED ORDER — OXYCODONE-ACETAMINOPHEN 5-325 MG PO TABS: 1.0000 | ORAL_TABLET | ORAL | 0 refills | Status: DC | PRN

## 2016-05-25 MED FILL — GABAPENTIN 300 MG CAPSULE: 300 | 30 days supply | Qty: 90 | Fill #0

## 2016-05-25 NOTE — Assessment & Plan Note (Signed)
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  Treatment summary: 1. Neoadjuvant chemotherapy with dose dense Adriamycin and Cytoxan 4 followed by Taxol and carboplatin 12 weekly from 01/18/2015 to 06/21/2015 2. left Mastectomy With axillary lymph node dissection 08/23/2015: Invasive ductal carcinoma with calcifications grade 3, 1.3 cm, image, margins negative, 1/6 lymph nodes positive with extracapsular extension, ER 0%, 0%, T1cN1a stage IIA 3. Plan: Adjuvant XRT 10/31/15 - 01/08/2016  Plan:  1. Adj Capecitabine for 6 months Started 02/05/2016.  2. SWOG 1418 trial with Pembro vs Placebo  Xeloda toxicities: 1. Occasional loose stools 2.mild nausea No evidence of hand-foot syndrome.  RTC in 6 weeks for follow up.

## 2016-05-25 NOTE — Patient Instructions (Signed)
Implanted Port Home Guide An implanted port is a type of central line that is placed under the skin. Central lines are used to provide IV access when treatment or nutrition needs to be given through a person's veins. Implanted ports are used for long-term IV access. An implanted port may be placed because:  You need IV medicine that would be irritating to the small veins in your hands or arms.  You need long-term IV medicines, such as antibiotics.  You need IV nutrition for a long period.  You need frequent blood draws for lab tests.  You need dialysis.  Implanted ports are usually placed in the chest area, but they can also be placed in the upper arm, the abdomen, or the leg. An implanted port has two main parts:  Reservoir. The reservoir is round and will appear as a small, raised area under your skin. The reservoir is the part where a needle is inserted to give medicines or draw blood.  Catheter. The catheter is a thin, flexible tube that extends from the reservoir. The catheter is placed into a large vein. Medicine that is inserted into the reservoir goes into the catheter and then into the vein.  How will I care for my incision site? Do not get the incision site wet. Bathe or shower as directed by your health care provider. How is my port accessed? Special steps must be taken to access the port:  Before the port is accessed, a numbing cream can be placed on the skin. This helps numb the skin over the port site.  Your health care provider uses a sterile technique to access the port. ? Your health care provider must put on a mask and sterile gloves. ? The skin over your port is cleaned carefully with an antiseptic and allowed to dry. ? The port is gently pinched between sterile gloves, and a needle is inserted into the port.  Only "non-coring" port needles should be used to access the port. Once the port is accessed, a blood return should be checked. This helps ensure that the port  is in the vein and is not clogged.  If your port needs to remain accessed for a constant infusion, a clear (transparent) bandage will be placed over the needle site. The bandage and needle will need to be changed every week, or as directed by your health care provider.  Keep the bandage covering the needle clean and dry. Do not get it wet. Follow your health care provider's instructions on how to take a shower or bath while the port is accessed.  If your port does not need to stay accessed, no bandage is needed over the port.  What is flushing? Flushing helps keep the port from getting clogged. Follow your health care provider's instructions on how and when to flush the port. Ports are usually flushed with saline solution or a medicine called heparin. The need for flushing will depend on how the port is used.  If the port is used for intermittent medicines or blood draws, the port will need to be flushed: ? After medicines have been given. ? After blood has been drawn. ? As part of routine maintenance.  If a constant infusion is running, the port may not need to be flushed.  How long will my port stay implanted? The port can stay in for as long as your health care provider thinks it is needed. When it is time for the port to come out, surgery will be   done to remove it. The procedure is similar to the one performed when the port was put in. When should I seek immediate medical care? When you have an implanted port, you should seek immediate medical care if:  You notice a bad smell coming from the incision site.  You have swelling, redness, or drainage at the incision site.  You have more swelling or pain at the port site or the surrounding area.  You have a fever that is not controlled with medicine.  This information is not intended to replace advice given to you by your health care provider. Make sure you discuss any questions you have with your health care provider. Document  Released: 12/22/2004 Document Revised: 05/30/2015 Document Reviewed: 08/29/2012 [employer] Interactive Patient Education  2017 [employer]  

## 2016-05-25 NOTE — Addendum Note (Signed)
Addended by: Adalberto Cole on: 05/25/2016 02:50 PM   Modules accepted: Orders

## 2016-05-25 NOTE — Progress Notes (Signed)
Attempted to draw labs from port; port flushes well but was only to obtain approx 5 mL blood. Pt stuck in arm for lab draw.

## 2016-05-25 NOTE — Progress Notes (Signed)
Patient Care Team: [name], MD as PCP - General (Internal Medicine) Nicholas Lose, MD as Consulting Physician (Hematology and Oncology) Alphonsa Overall, MD as Consulting Physician (General Surgery)  DIAGNOSIS:  Encounter Diagnosis  Name Primary?  . 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      12/26/2014 Initial Diagnosis    Left breast biopsy: Invasive ductal carcinoma with lymphovascular invasion, 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, grade 3, ER/PR negative, Ki-67 80% HER-2 Neg      01/18/2015 - 06/21/2015 Neo-Adjuvant Chemotherapy    Dose dense Adriamycin and Cytoxan 4 followed by Taxol and carboplatin [id number]      07/03/2015 Breast MRI    Improvement in multiple areas of enhancement largest area 3.4 cm other suspicious nodules anterior to the mass also improved, left axillary internal mammary lymph nodes improved      08/23/2015 Surgery    Left mastectomy with axillary lymph node dissection: Invasive ductal carcinoma with calcifications grade 3, 1.3 cm, image, margins negative, 1/6 lymph nodes positive with extracapsular extension, ER 0%, 0%, T1cN1a stage II a       10/31/2015 - 01/08/2016 Radiation Therapy    Adj XRT      02/03/2016 -  Chemotherapy    Xeloda 1500 mg by mouth twice a day 2 weeks on one week off       CHIEF COMPLIANT: Follow-up on Xeloda  INTERVAL HISTORY: Deborah Mcintyre is a 61 year old with above-mentioned history left inflammatory breast cancer treated with neoadjuvant chemotherapy followed by left mastectomy and radiation. She is currently on  adjuvant Capecitabine that started 02/05/2016. She is tolerating gemcitabine fairly well except for occasional loose stools and mild nausea. She denies any hand-foot syndrome.  REVIEW OF SYSTEMS:   Constitutional: Denies fevers, chills or abnormal weight loss Eyes: Denies blurriness of vision Ears, nose, mouth, throat, and face: Denies mucositis or sore throat Respiratory: Denies cough, dyspnea or wheezes Cardiovascular: Denies palpitation, chest discomfort Gastrointestinal:  Denies nausea, heartburn or change in bowel habits Skin: Denies abnormal skin rashes Lymphatics: Denies new lymphadenopathy or easy bruising Neurological:Denies numbness, tingling or new weaknesses Behavioral/Psych: Mood is stable, no new changes  Extremities: No lower extremity edema Breast:  Left mastectomy All other systems were reviewed with the patient and are negative.  I have reviewed the past medical history, past surgical history, social history and family history with the patient and they are unchanged from previous note.  ALLERGIES:  has No Known Allergies.  MEDICATIONS:  Current Outpatient Prescriptions  Medication Sig Dispense Refill  . albuterol (PROVENTIL HFA;VENTOLIN HFA) 108 (90 Base) MCG/ACT inhaler Inhale 1-2 puffs into the lungs every 6 (six) hours as needed for wheezing or shortness of breath.    [name] albuterol (PROVENTIL) (2.5 MG/3ML) 0.083% nebulizer solution Take 2.5 mg by nebulization every 6 (six) hours as needed for wheezing or shortness of breath.    . ALPRAZolam (XANAX) 0.5 MG tablet Take 1 tablet (0.5 mg total) by mouth 3 (three) times daily as needed for anxiety or sleep. 60 tablet 0  . capecitabine (XELODA) 500 MG  tablet TAKE 3 TABLETS BY MOUTH TWICE A DAY AFTER A MEAL. TAKE FOR 14 DAYS AND THEN OFF FOR 1 WEEK 84 tablet 3  . conjugated estrogens (PREMARIN) vaginal cream Place 1 Applicatorful vaginally daily. 42.5 g 12  . cycloSPORINE (RESTASIS) 0.05 % ophthalmic emulsion Place 2 drops  into both eyes 2 (two) times daily.    [name] gabapentin (NEURONTIN) 300 MG capsule Take 1 capsule (300 mg total) by mouth 3 (three) times daily. 90 capsule 2  . ibuprofen (ADVIL,MOTRIN) 200 MG tablet Take 200 mg by mouth every 6 (six) hours as needed. Reported on 03/22/2015    . magic mouthwash w/lidocaine SOLN Take 5 mLs by mouth 2 (two) times daily as needed for mouth pain.    . metoprolol (LOPRESSOR) 50 MG tablet Take 0.5 tablets (25 mg total) by mouth 2 (two) times daily. (Patient taking differently: Take 50 mg by mouth 2 (two) times daily. )    . ondansetron (ZOFRAN) 8 MG tablet Take 1 tablet (8 mg total) by mouth every 8 (eight) hours as needed for nausea. 30 tablet 3  . oxyCODONE-acetaminophen (PERCOCET/ROXICET) 5-325 MG tablet Take 1 tablet by mouth every 4 (four) hours as needed for moderate pain or severe pain (1 tab every 4-6 hour prn pain). 1 tab every 4-6 hours prn pain, dispense 60 tabs no refill 60 tablet 0  . PARoxetine (PAXIL) 20 MG tablet Take 20 mg by mouth 2 (two) times daily.      No current facility-administered medications for this visit.    Facility-Administered Medications Ordered in Other Visits  Medication Dose Route Frequency Provider Last Rate Last Dose  . sodium chloride 0.9 % injection 10 mL  10 mL Intravenous PRN Nicholas Lose, MD   10 mL at 05/25/16 1440    PHYSICAL EXAMINATION: ECOG PERFORMANCE STATUS: 1 - Symptomatic but completely ambulatory  Vitals:   05/25/16 1453  BP: (!) 151/94  Pulse: 88  Resp: 19  Temp: 98 F (36.7 C)   Filed Weights   05/25/16 1453  Weight: 198 lb 11.2 oz (90.1 kg)    GENERAL:alert, no distress and comfortable SKIN: skin color, texture, turgor are normal, no rashes or significant lesions EYES: normal, Conjunctiva are pink and non-injected, sclera clear OROPHARYNX:no exudate, no erythema and lips, buccal mucosa, and tongue normal  NECK: supple, thyroid normal size, non-tender, without nodularity LYMPH:  no palpable  lymphadenopathy in the cervical, axillary or inguinal LUNGS: clear to auscultation and percussion with normal breathing effort HEART: regular rate & rhythm and no murmurs and no lower extremity edema ABDOMEN:abdomen soft, non-tender and normal bowel sounds MUSCULOSKELETAL:no cyanosis of digits and no clubbing  NEURO: alert & oriented x 3 with fluent speech, no focal motor/sensory deficits EXTREMITIES: No lower extremity edema  LABORATORY DATA:  I have reviewed the data as listed   Chemistry      Component Value Date/Time   NA 143 05/25/2016 1410   K 3.7 05/25/2016 1410   CL 107 08/20/2015 1356   CO2 27 05/25/2016 1410   BUN 10.8 05/25/2016 1410   CREATININE 0.8 05/25/2016 1410      Component Value Date/Time   CALCIUM 9.3 05/25/2016 1410   ALKPHOS 65 05/25/2016 1410   AST 14 05/25/2016 1410   ALT 8 05/25/2016 1410   BILITOT 0.50 05/25/2016 1410       Lab Results  Component Value Date   WBC 4.0 05/25/2016   HGB 13.5 05/25/2016   HCT 40.2 05/25/2016   MCV  103.8 (H) 05/25/2016   PLT 208 05/25/2016   NEUTROABS 2.5 05/25/2016    ASSESSMENT & PLAN:  Breast cancer of upper-outer quadrant of left female breast ([place]) Left breast biopsy12/21/16: inflammatory breast cancerInvasive ductal carcinoma with lymphovascular invasion, 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  Treatment summary: 1. Neoadjuvant chemotherapy with dose dense Adriamycin and Cytoxan 4 followed by Taxol and carboplatin 12 weekly from 01/18/2015 to 06/21/2015 2. left Mastectomy With axillary lymph node dissection 08/23/2015: Invasive ductal carcinoma with calcifications grade 3, 1.3 cm, image, margins negative, 1/6 lymph nodes positive with extracapsular extension, ER 0%, 0%, T1cN1a stage IIA 3. Plan:  Adjuvant XRT 10/31/15 - 01/08/2016  Plan:  1. Adj Capecitabine for 6 months Started 02/05/2016.  2. SWOG 1418 trial with Pembro vs Placebo  Xeloda toxicities: 1. Occasional loose stools 2.mild nausea No evidence of hand-foot syndrome.  We evaluated the patient for SWOG 1418 clinical trial. She was not felt to be eligible for the trial because the window for enrollment on the trial completed on 05/19/2016.  RTC in 6 weeks for follow up.  I spent 25 minutes talking to the patient of which more than half was spent in counseling and coordination of care.  Orders Placed This Encounter  Procedures  . MM DIAG BREAST TOMO BILATERAL    ANNUAL/ PF: 12/19/2014 BCG/ NO NEEDS/ EPIC ORDER [facility]  [phone]     Standing Status:   Future    Standing Expiration Date:   07/25/2017    Order Specific Question:   Reason for Exam (SYMPTOM  OR DIAGNOSIS REQUIRED)    Answer:   Annual mammograms for breast cancer    Order Specific Question:   Is the patient pregnant?    Answer:   No    Order Specific Question:   Preferred imaging location?    Answer:   [facility]   The patient has a good understanding of the overall plan. she agrees with it. she will call with any problems that may develop before the next visit here.   Rulon Eisenmenger, MD 05/25/16

## 2016-05-26 MED FILL — XELODA 500 MG TABLET: 500 | 21 days supply | Qty: 84 | Fill #0

## 2016-05-26 MED FILL — PREMARIN VAGINAL CREAM-APPL: 0.625 | 15 days supply | Qty: 30 | Fill #1

## 2016-05-27 ENCOUNTER — Telehealth: Payer: Self-pay | Admitting: *Deleted

## 2016-05-27 NOTE — Telephone Encounter (Signed)
Left VM for patient on 05/25/16, 05/26/16 and again today requesting patient return call regarding the clinical trial MD referred her for [facility] [phone]). She was a screen failure due to being outside the 270 day window since surgery. Research nurse wanted to inform her of this. Did not leave details on the message. Mauri Reading Michaelle Copas, RN, BSN Clinical Research Nurse 05/27/16 @ 1:23 pm

## 2016-06-18 MED FILL — XELODA 500 MG TABLET: 500 | 21 days supply | Qty: 84 | Fill #1

## 2016-06-26 MED FILL — PREMARIN VAGINAL CREAM-APPL: 0.625 | 15 days supply | Qty: 30 | Fill #2

## 2016-06-30 NOTE — Progress Notes (Deleted)
CLINIC:  Survivorship   REASON FOR VISIT:  Routine follow-up post-treatment for a recent history of breast cancer.  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      12/26/2014 Initial Diagnosis    Left breast biopsy: Invasive ductal carcinoma with lymphovascular invasion, 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, grade 3, ER/PR negative, Ki-67 80% HER-2 Neg      01/18/2015 - 06/21/2015 Neo-Adjuvant Chemotherapy    Dose dense Adriamycin and Cytoxan 4 followed by Taxol and carboplatin [id number]      07/03/2015 Breast MRI    Improvement in multiple areas of enhancement largest area 3.4 cm other suspicious nodules anterior to the mass also improved, left axillary internal mammary lymph nodes improved      08/23/2015 Surgery    Left mastectomy with axillary lymph node dissection: Invasive ductal carcinoma with calcifications grade 3, 1.3 cm, image, margins negative, 1/6 lymph nodes positive with extracapsular extension, ER 0%, 0%, T1cN1a stage II a       10/31/2015 - 01/08/2016 Radiation Therapy    Adj XRT      02/03/2016 -  Chemotherapy    Xeloda 1500 mg by mouth twice a day 2 weeks on one week off       INTERVAL HISTORY:  Deborah Mcintyre presents to the [facility] today for our initial meeting to review her survivorship care plan detailing her treatment course for breast cancer, as well as monitoring long-term side effects of that treatment, education regarding health maintenance, screening, and overall wellness and health promotion.     Overall, Deborah Mcintyre reports feeling quite well since completing her radiation therapy approximately 3 months ago.  She ***    REVIEW OF SYSTEMS:  Review  of Systems - Oncology Breast: Denies any new nodularity, masses, tenderness, nipple changes, or nipple discharge.      ONCOLOGY TREATMENT TEAM:  1. Surgeon:  Dr. [name] at [facility] 2. Medical Oncologist: Dr. [name]  3. Radiation Oncologist: Dr. [name]    PAST MEDICAL/SURGICAL HISTORY:  Past Medical History:  Diagnosis Date  . Anxiety   . Arthritis   . Bronchitis   . COPD (chronic obstructive pulmonary disease) ([place])   . Depression   . Hypertension   . Malignant neoplasm of upper-outer quadrant of left female breast ([place]) 01/04/2015  . Nocturia   . Numbness and tingling    toes - bilateral  . Pneumonia   . PONV (postoperative nausea and vomiting)    Nausea   Past Surgical History:  Procedure Laterality Date  . BREAST LUMPECTOMY Bilateral    x3  . BREAST LUMPECTOMY WITH RADIOACTIVE SEED LOCALIZATION Right 08/23/2015   Procedure: RIGHT BREAST LUMPECTOMY WITH RADIOACTIVE SEED LOCALIZATION;  Surgeon: Alphonsa Overall, MD;  Location: [place];  Service: General;  Laterality: Right;  . CESAREAN SECTION     x4  . MASTECTOMY Left 08/23/2015    RIGHT BREAST LUMPECTOMY WITH RADIOACTIVE SEED LOCALIZATION,  LEFT MASTECTOMY WITH AXILLARY LYMPH NODE DISSECTION  . MASTECTOMY WITH AXILLARY LYMPH NODE DISSECTION Left 08/23/2015   Procedure: LEFT MASTECTOMY WITH AXILLARY LYMPH NODE DISSECTION;  Surgeon: Alphonsa Overall, MD;  Location: [place];  Service: General;  Laterality: Left;  .  MULTIPLE TOOTH EXTRACTIONS       ALLERGIES:  No Known Allergies   CURRENT MEDICATIONS:  Outpatient Encounter Prescriptions as of 07/01/2016  Medication Sig  . albuterol (PROVENTIL HFA;VENTOLIN HFA) 108 (90 Base) MCG/ACT inhaler Inhale 1-2 puffs into the lungs every 6 (six) hours as needed for wheezing or shortness of breath.  [name] albuterol (PROVENTIL) (2.5 MG/3ML) 0.083% nebulizer solution Take 2.5 mg by nebulization every 6 (six) hours as needed for wheezing or shortness of breath.  . ALPRAZolam (XANAX) 0.5 MG  tablet Take 1 tablet (0.5 mg total) by mouth 3 (three) times daily as needed for anxiety or sleep.  . capecitabine (XELODA) 500 MG tablet TAKE 3 TABLETS BY MOUTH TWICE A DAY AFTER A MEAL. TAKE FOR 14 DAYS AND THEN OFF FOR 1 WEEK  . conjugated estrogens (PREMARIN) vaginal cream Place 1 Applicatorful vaginally daily.  . cycloSPORINE (RESTASIS) 0.05 % ophthalmic emulsion Place 2 drops into both eyes 2 (two) times daily.  [name] gabapentin (NEURONTIN) 300 MG capsule Take 1 capsule (300 mg total) by mouth 3 (three) times daily.  [name] ibuprofen (ADVIL,MOTRIN) 200 MG tablet Take 200 mg by mouth every 6 (six) hours as needed. Reported on 03/22/2015  . magic mouthwash w/lidocaine SOLN Take 5 mLs by mouth 2 (two) times daily as needed for mouth pain.  . metoprolol (LOPRESSOR) 50 MG tablet Take 0.5 tablets (25 mg total) by mouth 2 (two) times daily. (Patient taking differently: Take 50 mg by mouth 2 (two) times daily. )  . ondansetron (ZOFRAN) 8 MG tablet Take 1 tablet (8 mg total) by mouth every 8 (eight) hours as needed for nausea.  [name] oxyCODONE-acetaminophen (PERCOCET/ROXICET) 5-325 MG tablet Take 1 tablet by mouth every 4 (four) hours as needed for moderate pain or severe pain (1 tab every 4-6 hour prn pain). 1 tab every 4-6 hours prn pain, dispense 60 tabs no refill  . PARoxetine (PAXIL) 20 MG tablet Take 20 mg by mouth 2 (two) times daily.    Facility-Administered Encounter Medications as of 07/01/2016  Medication  . sodium chloride 0.9 % injection 10 mL     ONCOLOGIC FAMILY HISTORY:  No family history on file.   GENETIC COUNSELING/TESTING: ***  SOCIAL HISTORY:  Deborah Mcintyre is /single/married/divorced/widowed/separated and lives alone/with her spouse/family/friend in (city), [place].  She has (#) children and they live in (city).  Deborah Mcintyre is currently retired/disabled/working part-time/full-time as ***.  She denies any current or history of tobacco, alcohol, or illicit drug use.      PHYSICAL EXAMINATION:  Vital Signs:  There were no vitals filed for this visit. There were no vitals filed for this visit. General: Well-nourished, well-appearing female in no acute distress.  She is unaccompanied/accompanied in clinic by her ***** today.   HEENT: Head is normocephalic.  Pupils equal and reactive to light. Conjunctivae clear without exudate.  Sclerae anicteric. Oral mucosa is pink, moist.  Oropharynx is pink without lesions or erythema.  Lymph: No cervical, supraclavicular, or infraclavicular lymphadenopathy noted on palpation.  Cardiovascular: Regular rate and rhythm.[name] Respiratory: Clear to auscultation bilaterally. Chest expansion symmetric; breathing non-labored.  GI: Abdomen soft and round; non-tender, non-distended. Bowel sounds normoactive.  GU: Deferred.  Neuro: No focal deficits. Steady gait.  Psych: Mood and affect normal and appropriate for situation.  Extremities: No edema. MSK: No focal spinal tenderness to palpation.  Full range of motion in bilateral upper extremities Skin: Warm and dry.  LABORATORY DATA:  None for this visit.  DIAGNOSTIC  IMAGING:  None for this visit.      ASSESSMENT AND PLAN:  Ms.. Mcintyre is a pleasant 61 y.o. female with Stage *** right/left breast invasive ductal carcinoma, ER+/PR+/HER2-, diagnosed in (date), treated with lumpectomy, adjuvant radiation therapy, and anti-estrogen therapy with *** beginning in (date).  She presents to the Survivorship Clinic for our initial meeting and routine follow-up post-completion of treatment for breast cancer.    1. Stage *** right/left breast cancer:  Deborah Mcintyre is continuing to recover from definitive treatment for breast cancer. She will follow-up with her medical oncologist, Dr. Ross Ludwig in (month) /2017 with history and physical exam per surveillance protocol.  She will continue her anti-estrogen therapy with (drug). Thus far, she is tolerating the *** well, with minimal  side effects. She was instructed to make Dr. Lindi Adie or myself aware if she begins to experience any worsening side effects of the medication and I could see her back in clinic to help manage those side effects, as needed. Though the incidence is low, there is an associated risk of endometrial cancer with anti-estrogen therapies like Tamoxifen.  Deborah Mcintyre was encouraged to contact Dr. Carrington Mcintyre or myself with any vaginal bleeding while taking Tamoxifen. Other side effects of Tamoxifen were again reviewed with her as well. Today, a comprehensive survivorship care plan and treatment summary was reviewed with the patient today detailing her breast cancer diagnosis, treatment course, potential late/long-term effects of treatment, appropriate follow-up care with recommendations for the future, and patient education resources.  A copy of this summary, along with a letter will be sent to the patient's primary care provider via mail/fax/In Basket message after today's visit.    #. Problem(s) at Visit______________  #. Bone health:  Given Deborah Mcintyre age/history of breast cancer and her current treatment regimen including anti-estrogen therapy with _______, she is at risk for bone demineralization.  Her last DEXA scan was **/**/20**, which showed (results).***  In the meantime, she was encouraged to increase her consumption of foods rich in calcium, as well as increase her weight-bearing activities.  She was given education on specific activities to promote bone health.  #. Cancer screening:  Due to Deborah Mcintyre history and her age, she should receive screening for skin cancers, colon cancer, and gynecologic cancers.  The information and recommendations are listed on the patient's comprehensive care plan/treatment summary and were reviewed in detail with the patient.    #. Health maintenance and wellness promotion: Deborah Mcintyre was encouraged to consume 5-7 servings of fruits and vegetables per day. We  reviewed the "Nutrition Rainbow" handout, as well as the handout "Take Control of Your Health and Reduce Your Cancer Risk" from the [employer].  She was also encouraged to engage in moderate to vigorous exercise for 30 minutes per day most days of the week. We discussed the [employer] fitness program, which is designed for cancer survivors to help them become more physically fit after cancer treatments.  She was instructed to limit her alcohol consumption and continue to abstain from tobacco use/***was encouraged stop smoking.     #. Support services/counseling: It is not uncommon for this period of the patient's cancer care trajectory to be one of many emotions and stressors.  We discussed an opportunity for her to participate in the next session of [facility] ("Finding Your New Normal") support group series designed for patients after they have completed treatment.   Deborah Mcintyre was encouraged to take advantage of our many other support services programs,  support groups, and/or counseling in coping with her new life as a cancer survivor after completing anti-cancer treatment.  She was offered support today through active listening and expressive supportive counseling.  She was given information regarding our available services and encouraged to contact me with any questions or for help enrolling in any of our support group/programs.    Dispo:   -Return to cancer center ***  -Mammogram due in *** -Follow up with surgery *** -She is welcome to return back to the Survivorship Clinic at any time; no additional follow-up needed at this time.  -Consider referral back to survivorship as a long-term survivor for continued surveillance  A total of (30) minutes of face-to-face time was spent with this patient with greater than 50% of that time in counseling and care-coordination.   Gardenia Phlegm, [place] [phone]   Note: PRIMARY CARE  PROVIDER [name], [place] [phone]

## 2016-07-01 ENCOUNTER — Encounter: Payer: Medicare Other | Admitting: Adult Health

## 2016-07-02 ENCOUNTER — Other Ambulatory Visit: Payer: Self-pay | Admitting: *Deleted

## 2016-07-02 ENCOUNTER — Ambulatory Visit (HOSPITAL_BASED_OUTPATIENT_CLINIC_OR_DEPARTMENT_OTHER): Payer: Medicare Other

## 2016-07-02 ENCOUNTER — Other Ambulatory Visit: Payer: Self-pay

## 2016-07-02 ENCOUNTER — Telehealth: Payer: Self-pay

## 2016-07-02 VITALS — BP 146/86 | HR 92 | Temp 98.6°F | Resp 18 | Ht 67.5 in | Wt 203.1 lb

## 2016-07-02 DIAGNOSIS — C50412 Malignant neoplasm of upper-outer quadrant of left female breast: Secondary | ICD-10-CM

## 2016-07-02 DIAGNOSIS — Z171 Estrogen receptor negative status [ER-]: Principal | ICD-10-CM

## 2016-07-02 DIAGNOSIS — R197 Diarrhea, unspecified: Secondary | ICD-10-CM

## 2016-07-02 DIAGNOSIS — Z170 Estrogen receptor positive status [ER+]: Principal | ICD-10-CM

## 2016-07-02 LAB — CBC WITH DIFFERENTIAL/PLATELET
BASO%: 0.6 % (ref 0.0–2.0)
Basophils Absolute: 0 10*3/uL (ref 0.0–0.1)
EOS%: 2.4 % (ref 0.0–7.0)
Eosinophils Absolute: 0.1 10*3/uL (ref 0.0–0.5)
HEMATOCRIT: 41.3 % (ref 34.8–46.6)
HGB: 13.9 g/dL (ref 11.6–15.9)
LYMPH#: 1 10*3/uL (ref 0.9–3.3)
LYMPH%: 26.5 % (ref 14.0–49.7)
MCH: 35.4 pg — ABNORMAL HIGH (ref 25.1–34.0)
MCHC: 33.6 g/dL (ref 31.5–36.0)
MCV: 105.1 fL — ABNORMAL HIGH (ref 79.5–101.0)
MONO#: 0.4 10*3/uL (ref 0.1–0.9)
MONO%: 11.1 % (ref 0.0–14.0)
NEUT#: 2.3 10*3/uL (ref 1.5–6.5)
NEUT%: 59.4 % (ref 38.4–76.8)
Platelets: 183 10*3/uL (ref 145–400)
RBC: 3.93 10*6/uL (ref 3.70–5.45)
RDW: 14.7 % — ABNORMAL HIGH (ref 11.2–14.5)
WBC: 3.9 10*3/uL (ref 3.9–10.3)

## 2016-07-02 LAB — COMPREHENSIVE METABOLIC PANEL
ALBUMIN: 3.9 g/dL (ref 3.5–5.0)
ALT: 9 U/L (ref 0–55)
AST: 13 U/L (ref 5–34)
Alkaline Phosphatase: 62 U/L (ref 40–150)
Anion Gap: 7 mEq/L (ref 3–11)
BUN: 10.5 mg/dL (ref 7.0–26.0)
CO2: 28 mEq/L (ref 22–29)
Calcium: 9.6 mg/dL (ref 8.4–10.4)
Chloride: 107 mEq/L (ref 98–109)
Creatinine: 0.8 mg/dL (ref 0.6–1.1)
Glucose: 88 mg/dl (ref 70–140)
POTASSIUM: 4 meq/L (ref 3.5–5.1)
Sodium: 142 mEq/L (ref 136–145)
Total Bilirubin: 0.69 mg/dL (ref 0.20–1.20)
Total Protein: 7 g/dL (ref 6.4–8.3)

## 2016-07-02 LAB — MAGNESIUM: MAGNESIUM: 2 mg/dL (ref 1.5–2.5)

## 2016-07-02 MED ORDER — HEPARIN SOD (PORK) LOCK FLUSH 100 UNIT/ML IV SOLN
500.0000 [IU] | Freq: Once | INTRAVENOUS | Status: DC | PRN
  Filled 2016-07-02: qty 5

## 2016-07-02 MED ORDER — SODIUM CHLORIDE 0.9 % IJ SOLN
10.0000 mL | INTRAMUSCULAR | Status: DC | PRN
  Filled 2016-07-02: qty 10

## 2016-07-02 MED ORDER — SODIUM CHLORIDE 0.9 % IV SOLN
Freq: Once | INTRAVENOUS | Status: AC
  Administered 2016-07-02: 16:00:00 via INTRAVENOUS

## 2016-07-02 MED ORDER — DIPHENOXYLATE-ATROPINE 2.5-0.025 MG PO TABS: 1.0000 | ORAL_TABLET | Freq: Four times a day (QID) | ORAL | 0 refills | Status: DC | PRN

## 2016-07-02 MED ORDER — OXYCODONE-ACETAMINOPHEN 5-325 MG PO TABS: 1.0000 | ORAL_TABLET | ORAL | 0 refills | Status: DC | PRN

## 2016-07-02 MED FILL — OXYCODONE-ACETAMINOPHEN 5-3: 5-325 | 10 days supply | Qty: 60 | Fill #0

## 2016-07-02 MED FILL — DIPHENOXYLATE/ATROPINE TAB: 2.5-0.025 | 7 days supply | Qty: 30 | Fill #0

## 2016-07-02 NOTE — Patient Instructions (Signed)

## 2016-07-02 NOTE — Telephone Encounter (Addendum)
Diarrhea for 4-5 days, had 10 diarrhea stools yesterday, watery stools. She will eat or drink something, about 15 minutes later will get stomach pain and then have diarrhea. Therefore pt is not eating or drinking much.  Pt taking xeloda. Finished with xrt. Some stomach pains, some nausea, no vomiting.  Pt requesting refill of percocet for surgical pain. Next lab/MD 7/2  S/w Dr Lindi Adie, and prepared rx for lomotil and percocet.  Arranged labs and [facility] today.   Called and s/w boyfriend to come right on in. He was agreeable.  CMet ordered, scheduling inbasket sent, rx at Dr Geralyn Flash desk for signature.

## 2016-07-02 NOTE — Progress Notes (Signed)
Reviewed pt labs and pt status with Dr. Lindi Adie. Pt requesting to go home and not get all of the IV fluid.  He is agreeable to that.   Discharge teaching done regarding trying imodium first before using lomotil. Reviewed BRAT diet and increasing po fluids.  Pt has medicine for nausea if needed.  Also per Dr. Lindi Adie pt to take only 2 tablets of Xeloda BID instead of 3 tablets. Pt verbalized understanding.   She has appt to see Dr. Lindi Adie on Monday, 07/06/16.  She knows to call if problems persist.

## 2016-07-03 ENCOUNTER — Other Ambulatory Visit: Payer: Self-pay

## 2016-07-03 DIAGNOSIS — Z171 Estrogen receptor negative status [ER-]: Principal | ICD-10-CM

## 2016-07-03 DIAGNOSIS — C50412 Malignant neoplasm of upper-outer quadrant of left female breast: Secondary | ICD-10-CM

## 2016-07-06 ENCOUNTER — Ambulatory Visit (HOSPITAL_BASED_OUTPATIENT_CLINIC_OR_DEPARTMENT_OTHER): Payer: Medicare Other | Admitting: Hematology and Oncology

## 2016-07-06 ENCOUNTER — Other Ambulatory Visit (HOSPITAL_BASED_OUTPATIENT_CLINIC_OR_DEPARTMENT_OTHER): Payer: Medicare Other

## 2016-07-06 DIAGNOSIS — C773 Secondary and unspecified malignant neoplasm of axilla and upper limb lymph nodes: Secondary | ICD-10-CM

## 2016-07-06 DIAGNOSIS — Z171 Estrogen receptor negative status [ER-]: Secondary | ICD-10-CM

## 2016-07-06 DIAGNOSIS — C50412 Malignant neoplasm of upper-outer quadrant of left female breast: Secondary | ICD-10-CM | POA: Diagnosis present

## 2016-07-06 LAB — CBC WITH DIFFERENTIAL/PLATELET
BASO%: 0.3 % (ref 0.0–2.0)
BASOS ABS: 0 10*3/uL (ref 0.0–0.1)
EOS ABS: 0.1 10*3/uL (ref 0.0–0.5)
EOS%: 2.2 % (ref 0.0–7.0)
HCT: 41.8 % (ref 34.8–46.6)
HGB: 14 g/dL (ref 11.6–15.9)
LYMPH%: 30.9 % (ref 14.0–49.7)
MCH: 35.5 pg — AB (ref 25.1–34.0)
MCHC: 33.5 g/dL (ref 31.5–36.0)
MCV: 106.1 fL — AB (ref 79.5–101.0)
MONO#: 0.4 10*3/uL (ref 0.1–0.9)
MONO%: 10.8 % (ref 0.0–14.0)
NEUT%: 55.8 % (ref 38.4–76.8)
NEUTROS ABS: 2 10*3/uL (ref 1.5–6.5)
PLATELETS: 205 10*3/uL (ref 145–400)
RBC: 3.94 10*6/uL (ref 3.70–5.45)
RDW: 13.7 % (ref 11.2–14.5)
WBC: 3.6 10*3/uL — ABNORMAL LOW (ref 3.9–10.3)
lymph#: 1.1 10*3/uL (ref 0.9–3.3)

## 2016-07-06 LAB — COMPREHENSIVE METABOLIC PANEL
ALT: 10 U/L (ref 0–55)
ANION GAP: 10 meq/L (ref 3–11)
AST: 14 U/L (ref 5–34)
Albumin: 3.9 g/dL (ref 3.5–5.0)
Alkaline Phosphatase: 59 U/L (ref 40–150)
BILIRUBIN TOTAL: 0.55 mg/dL (ref 0.20–1.20)
BUN: 12.8 mg/dL (ref 7.0–26.0)
CO2: 26 meq/L (ref 22–29)
Calcium: 9.4 mg/dL (ref 8.4–10.4)
Chloride: 106 mEq/L (ref 98–109)
Creatinine: 0.8 mg/dL (ref 0.6–1.1)
Glucose: 97 mg/dl (ref 70–140)
POTASSIUM: 3.9 meq/L (ref 3.5–5.1)
Sodium: 142 mEq/L (ref 136–145)
TOTAL PROTEIN: 7 g/dL (ref 6.4–8.3)

## 2016-07-06 MED ORDER — CAPECITABINE 500 MG PO TABS: ORAL_TABLET | ORAL | 0 refills | Status: DC

## 2016-07-06 MED FILL — XELODA 500 MG TABLET: 500 | 21 days supply | Qty: 56 | Fill #0

## 2016-07-06 NOTE — Progress Notes (Signed)
Patient Care Team: [name], MD as PCP - General (Internal Medicine) Nicholas Lose, MD as Consulting Physician (Hematology and Oncology) Alphonsa Overall, MD as Consulting Physician (General Surgery) Delice Bison, Charlestine Massed, NP as Nurse Practitioner (Hematology and Oncology) Kyung Rudd, MD as Consulting Physician (Radiation Oncology)  DIAGNOSIS:  Encounter Diagnoses  Name Primary?  . Malignant neoplasm of upper-outer quadrant of left breast in female, estrogen receptor negative ([place])   . 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      12/26/2014 Initial Diagnosis    Left breast biopsy: Invasive ductal carcinoma with lymphovascular invasion, 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, grade 3, ER/PR negative, Ki-67 80% HER-2 Neg      01/18/2015 - 06/21/2015 Neo-Adjuvant Chemotherapy    Dose dense Adriamycin and Cytoxan 4 followed by Taxol and carboplatin [id number]      07/03/2015 Breast MRI    Improvement in multiple areas of enhancement largest area 3.4 cm other suspicious nodules anterior to the mass also improved, left axillary internal mammary lymph nodes improved      08/23/2015 Surgery    Left mastectomy with axillary lymph node dissection: Invasive ductal carcinoma with calcifications grade 3, 1.3 cm, image, margins negative, 1/6 lymph nodes positive with extracapsular extension, ER 0%, 0%, T1cN1a stage II a       10/31/2015 - 01/08/2016 Radiation Therapy    Adj XRT Lisbeth Renshaw): Left Chest Wall and Left SCLV treated to 50.4 Gy in 28 fractions of 1.8 Gy. Left Chest Wall was then  boosted to 60.4 Gy in 5 fractions of 2 Gy.      02/03/2016 -  Chemotherapy    Xeloda 1500 mg by mouth twice a day 2 weeks on one week off       CHIEF COMPLIANT: Follow-up on Xeloda  INTERVAL HISTORY: Deborah Mcintyre is a 61 year old with above-mentioned history of left breast cancer treated with mastectomy followed by radiation and has been on adjuvant Xeloda. She developed a lot of diarrhea. Because of this would reduce the dosage of Xeloda. Diarrhea has subsided. She no longer has this symptom. Denies any nausea vomiting.  REVIEW OF SYSTEMS:   Constitutional: Denies fevers, chills or abnormal weight loss Eyes: Denies blurriness of vision Ears, nose, mouth, throat, and face: Denies mucositis or sore throat Respiratory: Denies cough, dyspnea or wheezes Cardiovascular: Denies palpitation, chest discomfort Gastrointestinal:  Denies nausea, heartburn or change in bowel habits Skin: Denies abnormal skin rashes Lymphatics: Denies new lymphadenopathy or easy bruising Neurological:Denies numbness, tingling or new weaknesses Behavioral/Psych: Mood is stable, no new changes  Extremities: No lower extremity edema Breast:  Complains of intermittent pain in the left breast. All other systems were reviewed with the patient and are negative.  I have reviewed the past medical history, past surgical history, social history and family history with the patient and they are unchanged from previous note.  ALLERGIES:  has No Known Allergies.  MEDICATIONS:  Current Outpatient Prescriptions  Medication Sig Dispense Refill  . albuterol (PROVENTIL HFA;VENTOLIN HFA) 108 (90 Base) MCG/ACT inhaler Inhale 1-2 puffs into the lungs every  6 (six) hours as needed for wheezing or shortness of breath.    [name] albuterol (PROVENTIL) (2.5 MG/3ML) 0.083% nebulizer solution Take 2.5 mg by nebulization every 6 (six) hours as needed for wheezing or shortness of breath.    . ALPRAZolam (XANAX) 0.5 MG tablet Take 1 tablet  (0.5 mg total) by mouth 3 (three) times daily as needed for anxiety or sleep. 60 tablet 0  . capecitabine (XELODA) 500 MG tablet TAKE 2 TABLETS BY MOUTH TWICE A DAY AFTER A MEAL. TAKE FOR 14 DAYS AND THEN OFF FOR 1 WEEK 56 tablet 0  . conjugated estrogens (PREMARIN) vaginal cream Place 1 Applicatorful vaginally daily. 42.5 g 12  . cycloSPORINE (RESTASIS) 0.05 % ophthalmic emulsion Place 2 drops into both eyes 2 (two) times daily.    . diphenoxylate-atropine (LOMOTIL) 2.5-0.025 MG tablet Take 1 tablet by mouth 4 (four) times daily as needed for diarrhea or loose stools. 30 tablet 0  . gabapentin (NEURONTIN) 300 MG capsule Take 1 capsule (300 mg total) by mouth 3 (three) times daily. 90 capsule 2  . ibuprofen (ADVIL,MOTRIN) 200 MG tablet Take 200 mg by mouth every 6 (six) hours as needed. Reported on 03/22/2015    . magic mouthwash w/lidocaine SOLN Take 5 mLs by mouth 2 (two) times daily as needed for mouth pain.    . metoprolol (LOPRESSOR) 50 MG tablet Take 0.5 tablets (25 mg total) by mouth 2 (two) times daily. (Patient not taking: Reported on 07/02/2016)    . ondansetron (ZOFRAN) 8 MG tablet Take 1 tablet (8 mg total) by mouth every 8 (eight) hours as needed for nausea. 30 tablet 3  . oxyCODONE-acetaminophen (PERCOCET/ROXICET) 5-325 MG tablet Take 1 tablet by mouth every 4 (four) hours as needed for moderate pain or severe pain (1 tab every 4-6 hour prn pain). 60 tablet 0  . PARoxetine (PAXIL) 20 MG tablet Take 20 mg by mouth 2 (two) times daily.      No current facility-administered medications for this visit.    Facility-Administered Medications Ordered in Other Visits  Medication Dose Route Frequency Provider Last Rate Last Dose  . sodium chloride 0.9 % injection 10 mL  10 mL Intravenous PRN Nicholas Lose, MD   10 mL at 05/25/16 1440    PHYSICAL EXAMINATION: ECOG PERFORMANCE STATUS: 1 - Symptomatic but completely ambulatory  Vitals:   07/06/16 1522  BP: (!) 136/94  Pulse: 89  Resp: 18    Temp: 98.2 F (36.8 C)   Filed Weights   07/06/16 1522  Weight: 202 lb (91.6 kg)    GENERAL:alert, no distress and comfortable SKIN: skin color, texture, turgor are normal, no rashes or significant lesions EYES: normal, Conjunctiva are pink and non-injected, sclera clear OROPHARYNX:no exudate, no erythema and lips, buccal mucosa, and tongue normal  NECK: supple, thyroid normal size, non-tender, without nodularity LYMPH:  no palpable lymphadenopathy in the cervical, axillary or inguinal LUNGS: clear to auscultation and percussion with normal breathing effort HEART: regular rate & rhythm and no murmurs and no lower extremity edema ABDOMEN:abdomen soft, non-tender and normal bowel sounds MUSCULOSKELETAL:no cyanosis of digits and no clubbing  NEURO: alert & oriented x 3 with fluent speech, no focal motor/sensory deficits EXTREMITIES: No lower extremity edema  LABORATORY DATA:  I have reviewed the data as listed   Chemistry      Component Value Date/Time   NA 142 07/02/2016 1511   K 4.0 07/02/2016 1511   CL 107 08/20/2015 1356   CO2 28 07/02/2016  1511   BUN 10.5 07/02/2016 1511   CREATININE 0.8 07/02/2016 1511      Component Value Date/Time   CALCIUM 9.6 07/02/2016 1511   ALKPHOS 62 07/02/2016 1511   AST 13 07/02/2016 1511   ALT 9 07/02/2016 1511   BILITOT 0.69 07/02/2016 1511       Lab Results  Component Value Date   WBC 3.6 (L) 07/06/2016   HGB 14.0 07/06/2016   HCT 41.8 07/06/2016   MCV 106.1 (H) 07/06/2016   PLT 205 07/06/2016   NEUTROABS 2.0 07/06/2016    ASSESSMENT & PLAN:  Breast cancer of upper-outer quadrant of left female breast ([place]) Left breast biopsy12/21/16: inflammatory breast cancerInvasive ductal carcinoma with lymphovascular invasion, 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, grade 3, ER/PR negative, Her 2 Negative Left breast palpable mass at 2:00: 4 x 3.8 x 2.5 cm, enlarged left axillary lymph node 4 cm in size with  diffuse skin thickening T4 N1 (Stage 3B).  Treatment summary: 1. Neoadjuvant chemotherapy with dose dense Adriamycin and Cytoxan 4 followed by Taxol and carboplatin 12 weekly from 01/18/2015 to 06/21/2015 2. left Mastectomy With axillary lymph node dissection 08/23/2015: Invasive ductal carcinoma with calcifications grade 3, 1.3 cm, image, margins negative, 1/6 lymph nodes positive with extracapsular extension, ER 0%, 0%, T1cN1a stage IIA 3. Plan: Adjuvant XRT 10/31/15 - 01/08/2016  Plan: Adj Capecitabine for 6 months Started 02/05/2016 . To be completed at the end of July 2018  Xeloda toxicities: 1. Occasional loose stools 2.mild nausea No evidence of hand-foot syndrome.  We evaluated the patient for SWOG 1418 clinical trial. She was not felt to be eligible for the trial because the window for enrollment  Return to clinic in 6 months for follow-up  I spent 25 minutes talking to the patient of which more than half was spent in counseling and coordination of care.  Orders Placed This Encounter  Procedures  . CBC with Differential    Standing Status:   Future    Standing Expiration Date:   07/06/2017  . Comprehensive metabolic panel    Standing Status:   Future    Standing Expiration Date:   07/06/2017   The patient has a good understanding of the overall plan. she agrees with it. she will call with any problems that may develop before the next visit here.   Rulon Eisenmenger, MD 07/06/16

## 2016-07-06 NOTE — Assessment & Plan Note (Signed)
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  Treatment summary: 1. Neoadjuvant chemotherapy with dose dense Adriamycin and Cytoxan 4 followed by Taxol and carboplatin 12 weekly from 01/18/2015 to 06/21/2015 2. left Mastectomy With axillary lymph node dissection 08/23/2015: Invasive ductal carcinoma with calcifications grade 3, 1.3 cm, image, margins negative, 1/6 lymph nodes positive with extracapsular extension, ER 0%, 0%, T1cN1a stage IIA 3. Plan: Adjuvant XRT 10/31/15 - 01/08/2016  Plan: Adj Capecitabine for 6 months Started 02/05/2016 .   Xeloda toxicities: 1. Occasional loose stools 2.mild nausea No evidence of hand-foot syndrome.  We evaluated the patient for SWOG 1418 clinical trial. She was not felt to be eligible for the trial because the window for enrollment

## 2016-10-01 ENCOUNTER — Telehealth: Payer: Self-pay

## 2016-10-01 NOTE — Telephone Encounter (Signed)
Pt calling to request refill on gabapentin and percocet for her nerve pain that originated from her surgery. Started 1 week ago and worsening each day. Explained as a numbness, tingling, dullness pain. Pt had called surgeon's office and could not get help.   Pt was on gabapentin back in May 2018 but ran out of pills. Pt was on percocet as well, but ran out. Pt denies chest pain, sob, swelling and redness of the left arm and surgical area. Denies any exertional activities that could cause muscular pain. Pt states that neurontin/percocet was helping to alleviate pain before.   Pt completed xeloda in July 2018 and not on any active chemo treatment. Pt states that she had a scheduled port removal with Dr.Newman, but d/t an active eye infection, procedure was cancelled. Pt requesting to have port removal scheduled at [facility] if possible.   Told pt that will discuss request with Dr.Gudena and update pt with more information. Pt verbalized understanding.

## 2016-10-02 ENCOUNTER — Other Ambulatory Visit: Payer: Self-pay

## 2016-10-02 ENCOUNTER — Ambulatory Visit: Payer: Medicare Other | Admitting: Hematology and Oncology

## 2016-10-02 ENCOUNTER — Telehealth: Payer: Self-pay

## 2016-10-02 DIAGNOSIS — C50412 Malignant neoplasm of upper-outer quadrant of left female breast: Secondary | ICD-10-CM

## 2016-10-02 DIAGNOSIS — Z171 Estrogen receptor negative status [ER-]: Principal | ICD-10-CM

## 2016-10-02 MED ORDER — GABAPENTIN 300 MG PO CAPS: 300.0000 mg | ORAL_CAPSULE | Freq: Three times a day (TID) | ORAL | 2 refills | Status: DC

## 2016-10-02 MED ORDER — OXYCODONE-ACETAMINOPHEN 5-325 MG PO TABS: 1.0000 | ORAL_TABLET | ORAL | 0 refills | Status: DC | PRN

## 2016-10-02 NOTE — Telephone Encounter (Signed)
Prescriptions picked up today 10/02/16 by patients spouse. DR # [id number].

## 2016-10-06 ENCOUNTER — Ambulatory Visit (HOSPITAL_COMMUNITY)
Admission: EM | Admit: 2016-10-06 | Discharge: 2016-10-06 | Disposition: A | Discharge status: Home / Self Care | Payer: Medicare Other | Attending: Family Medicine | Admitting: Family Medicine

## 2016-10-06 ENCOUNTER — Encounter (HOSPITAL_COMMUNITY): Payer: Self-pay | Admitting: Emergency Medicine

## 2016-10-06 DIAGNOSIS — R21 Rash and other nonspecific skin eruption: Secondary | ICD-10-CM

## 2016-10-06 DIAGNOSIS — L299 Pruritus, unspecified: Secondary | ICD-10-CM

## 2016-10-06 MED ORDER — PREDNISONE 10 MG (21) PO TBPK: ORAL_TABLET | Freq: Every day | ORAL | 0 refills | Status: DC

## 2016-10-06 MED ORDER — PERMETHRIN 1 % EX LIQD
Freq: Once | CUTANEOUS | 0 refills | Status: AC
Start: 2016-10-06 — End: 2016-10-06

## 2016-10-06 MED FILL — PREMARIN VAGINAL CREAM-APPL: 0.625 | 15 days supply | Qty: 30 | Fill #3

## 2016-10-06 NOTE — ED Triage Notes (Signed)
Pt sts generalized itchy rash x several days

## 2016-10-07 NOTE — ED Provider Notes (Signed)
[facility]   [id number] 10/06/16 Arrival Time: 1924  ASSESSMENT & PLAN:  1. Itching   2. Rash and nonspecific skin eruption     Meds ordered this encounter  Medications  . predniSONE (STERAPRED UNI-PAK 21 TAB) 10 MG (21) TBPK tablet    Sig: Take by mouth daily. Take as directed.    Dispense:  21 tablet    Refill:  0  . permethrin (NIX) 1 % liquid    Sig: Apply topically once.    Dispense:  118 mL    Refill:  0   I am not sure of the exact etiology of her symptoms. Will empirically treat for scabies along with steroid for itching. Will f/u in 1-2 weeks. Reviewed expectations re: course of current medical issues. Questions answered. Outlined signs and symptoms indicating need for more acute intervention. Patient verbalized understanding. After Visit Summary given.   SUBJECTIVE:  Deborah Mcintyre is a 61 y.o. female who presents with complaint of "itching for the past seven weeks". Occasional "small bumps" on arms and legs. No h/o similar. No new exposures. Traveled to [facility] in May of 2018. No contacts with similar. Sought medical evaluation and given Vistaril and steroid cream. No relief. Itching now interfering with sleep. Afebrile.  ROS: As per HPI.   OBJECTIVE:  Vitals:   10/06/16 1950  BP: 129/79  Pulse: 91  Resp: 18  Temp: 98.6 F (37 C)  TempSrc: Oral  SpO2: 99%    General appearance: alert; no distress Eyes: PERRLA; EOMI; conjunctiva normal Neck: supple Lungs: clear to auscultation bilaterally Heart: regular rate and rhythm Extremities: no cyanosis or edema; symmetrical with no gross deformities Skin: warm and dry; few areas on extremities with small papules that are very non-specific Neurologic: normal gait; normal symmetric reflexes Psychological: alert and cooperative; normal mood and affect   No Known Allergies  Past Medical History:  Diagnosis Date  . Anxiety   . Arthritis   . Bronchitis   . COPD (chronic obstructive  pulmonary disease) ([place])   . Depression   . Hypertension   . Malignant neoplasm of upper-outer quadrant of left female breast ([place]) 01/04/2015  . Nocturia   . Numbness and tingling    toes - bilateral  . Pneumonia   . PONV (postoperative nausea and vomiting)    Nausea   Social History   Social History  . Marital status: Married    Spouse name: N/A  . Number of children: N/A  . Years of education: N/A   Occupational History  . Not on file.   Social History Main Topics  . Smoking status: Current Every Day Smoker    Packs/day: 0.75    Years: 45.00    Types: Cigarettes  . Smokeless tobacco: Never Used  . Alcohol use 0.0 oz/week     Comment: ocassionally  . Drug use: No  . Sexual activity: Not on file   Other Topics Concern  . Not on file   Social History Narrative  . No narrative on file   History reviewed. No pertinent family history. Past Surgical History:  Procedure Laterality Date  . BREAST LUMPECTOMY Bilateral    x3  . BREAST LUMPECTOMY WITH RADIOACTIVE SEED LOCALIZATION Right 08/23/2015   Procedure: RIGHT BREAST LUMPECTOMY WITH RADIOACTIVE SEED LOCALIZATION;  Surgeon: Alphonsa Overall, MD;  Location: [facility];  Service: General;  Laterality: Right;  . CESAREAN SECTION     x4  . MASTECTOMY Left 08/23/2015    RIGHT BREAST  LUMPECTOMY WITH RADIOACTIVE SEED LOCALIZATION,  LEFT MASTECTOMY WITH AXILLARY LYMPH NODE DISSECTION  . MASTECTOMY WITH AXILLARY LYMPH NODE DISSECTION Left 08/23/2015   Procedure: LEFT MASTECTOMY WITH AXILLARY LYMPH NODE DISSECTION;  Surgeon: Alphonsa Overall, MD;  Location: [facility];  Service: General;  Laterality: Left;  [name] MULTIPLE TOOTH Jorge Mandril, MD 10/07/16 [phone]

## 2016-10-18 ENCOUNTER — Other Ambulatory Visit: Payer: Self-pay | Admitting: Radiology

## 2016-10-20 ENCOUNTER — Encounter (HOSPITAL_COMMUNITY): Payer: Self-pay

## 2016-10-20 ENCOUNTER — Ambulatory Visit (HOSPITAL_COMMUNITY)
Admission: RE | Admit: 2016-10-20 | Discharge: 2016-10-20 | Disposition: A | Discharge status: Home / Self Care | Payer: Medicare Other | Source: Ambulatory Visit | Attending: Hematology and Oncology | Admitting: Hematology and Oncology

## 2016-10-20 ENCOUNTER — Other Ambulatory Visit: Payer: Self-pay | Admitting: Student

## 2016-10-20 DIAGNOSIS — I10 Essential (primary) hypertension: Secondary | ICD-10-CM | POA: Diagnosis not present

## 2016-10-20 DIAGNOSIS — Z452 Encounter for adjustment and management of vascular access device: Secondary | ICD-10-CM | POA: Insufficient documentation

## 2016-10-20 DIAGNOSIS — Z853 Personal history of malignant neoplasm of breast: Secondary | ICD-10-CM | POA: Diagnosis not present

## 2016-10-20 DIAGNOSIS — F329 Major depressive disorder, single episode, unspecified: Secondary | ICD-10-CM | POA: Diagnosis not present

## 2016-10-20 DIAGNOSIS — Z9221 Personal history of antineoplastic chemotherapy: Secondary | ICD-10-CM | POA: Diagnosis not present

## 2016-10-20 DIAGNOSIS — J449 Chronic obstructive pulmonary disease, unspecified: Secondary | ICD-10-CM | POA: Insufficient documentation

## 2016-10-20 DIAGNOSIS — C50412 Malignant neoplasm of upper-outer quadrant of left female breast: Secondary | ICD-10-CM

## 2016-10-20 DIAGNOSIS — Z171 Estrogen receptor negative status [ER-]: Secondary | ICD-10-CM

## 2016-10-20 DIAGNOSIS — F419 Anxiety disorder, unspecified: Secondary | ICD-10-CM | POA: Diagnosis not present

## 2016-10-20 HISTORY — PX: IR REMOVAL TUN ACCESS W/ PORT W/O FL MOD SED: IMG2290

## 2016-10-20 LAB — BASIC METABOLIC PANEL
ANION GAP: 8 (ref 5–15)
BUN: 13 mg/dL (ref 6–20)
CALCIUM: 8.8 mg/dL — AB (ref 8.9–10.3)
CO2: 25 mmol/L (ref 22–32)
Chloride: 108 mmol/L (ref 101–111)
Creatinine, Ser: 0.64 mg/dL (ref 0.44–1.00)
Glucose, Bld: 111 mg/dL — ABNORMAL HIGH (ref 65–99)
Potassium: 3.4 mmol/L — ABNORMAL LOW (ref 3.5–5.1)
Sodium: 141 mmol/L (ref 135–145)

## 2016-10-20 LAB — CBC WITH DIFFERENTIAL/PLATELET
BASOS ABS: 0 10*3/uL (ref 0.0–0.1)
BASOS PCT: 0 %
Eosinophils Absolute: 0.1 10*3/uL (ref 0.0–0.7)
Eosinophils Relative: 2 %
HEMATOCRIT: 39.7 % (ref 36.0–46.0)
HEMOGLOBIN: 13.1 g/dL (ref 12.0–15.0)
Lymphocytes Relative: 27 %
Lymphs Abs: 1.3 10*3/uL (ref 0.7–4.0)
MCH: 33.3 pg (ref 26.0–34.0)
MCHC: 33 g/dL (ref 30.0–36.0)
MCV: 101 fL — ABNORMAL HIGH (ref 78.0–100.0)
Monocytes Absolute: 0.4 10*3/uL (ref 0.1–1.0)
Monocytes Relative: 8 %
NEUTROS ABS: 3.1 10*3/uL (ref 1.7–7.7)
NEUTROS PCT: 63 %
Platelets: 218 10*3/uL (ref 150–400)
RBC: 3.93 MIL/uL (ref 3.87–5.11)
RDW: 12.4 % (ref 11.5–15.5)
WBC: 5 10*3/uL (ref 4.0–10.5)

## 2016-10-20 LAB — PROTIME-INR
INR: 0.88
Prothrombin Time: 11.9 seconds (ref 11.4–15.2)

## 2016-10-20 MED ORDER — CEFAZOLIN SODIUM-DEXTROSE 2-4 GM/100ML-% IV SOLN
2.0000 g | INTRAVENOUS | Status: AC
  Administered 2016-10-20: 2 g via INTRAVENOUS

## 2016-10-20 MED ORDER — LIDOCAINE HCL 1 % IJ SOLN
INTRAMUSCULAR | Status: AC | PRN
  Administered 2016-10-20: 15 mL

## 2016-10-20 MED ORDER — LIDOCAINE HCL 1 % IJ SOLN
INTRAMUSCULAR | Status: AC
  Filled 2016-10-20: qty 20

## 2016-10-20 MED ORDER — MIDAZOLAM HCL 2 MG/2ML IJ SOLN
INTRAMUSCULAR | Status: AC
  Filled 2016-10-20: qty 2

## 2016-10-20 MED ORDER — FENTANYL CITRATE (PF) 100 MCG/2ML IJ SOLN
INTRAMUSCULAR | Status: AC
  Filled 2016-10-20: qty 2

## 2016-10-20 MED ORDER — MIDAZOLAM HCL 2 MG/2ML IJ SOLN
INTRAMUSCULAR | Status: AC | PRN
  Administered 2016-10-20 (×2): 1 mg via INTRAVENOUS

## 2016-10-20 MED ORDER — SODIUM CHLORIDE 0.9 % IV SOLN
INTRAVENOUS | Status: DC
  Administered 2016-10-20: 12:00:00 via INTRAVENOUS

## 2016-10-20 MED ORDER — CEFAZOLIN SODIUM-DEXTROSE 2-4 GM/100ML-% IV SOLN
INTRAVENOUS | Status: AC
  Administered 2016-10-20: 2 g via INTRAVENOUS
  Filled 2016-10-20: qty 100

## 2016-10-20 MED ORDER — FENTANYL CITRATE (PF) 100 MCG/2ML IJ SOLN
INTRAMUSCULAR | Status: AC | PRN
  Administered 2016-10-20 (×2): 50 ug via INTRAVENOUS

## 2016-10-20 NOTE — Procedures (Signed)
Interventional Radiology Procedure Note  Procedure: Removal right chest portacatheter  Complications: None  Estimated Blood Loss: None  Recommendations: DC home  Signed,  Criselda Peaches, MD

## 2016-10-20 NOTE — Progress Notes (Addendum)
Patient in short stay for pre procedure port removal. Stated she went to urgent care 10/2 (progress note in [facility]) for all over itching. She stated they told her to use NIX treatment as directed. She has completed the prednisone  (she thinks it was a week which would have been 10/3 - 10/10). States itching is "somewhat" better and now it is mostly in her abdominal area. RN assessed skin and a few areas on the back of her neck with scars from previous scabs. Nothing seen on her abdominal / trunk area.Informed Dan RN in IR and he will inform IR MD.

## 2016-10-20 NOTE — H&P (Signed)
Referring Physician(s): Nicholas Lose  Supervising Physician: Jacqulynn Cadet  Patient Status:  [facility]  Chief Complaint:  "I'm getting my port out"  Subjective:  Pt familiar to IR service from prior port a cath placement on 01/17/15. She has a hx of left breast carcinoma, s/p surgery and chemoradiation. She presents today for port removal. She denies fever,HA,CP, dyspnea, cough, abd/back pain,N/V or bleeding. She has had some recent itching, nonspecific skin eruption of arms/legs, treated empirically for scabies and areas improving per pt with few areas on back of neck. Past Medical History:  Diagnosis Date  . Anxiety   . Arthritis   . Bronchitis   . COPD (chronic obstructive pulmonary disease) ([place])   . Depression   . Hypertension   . Malignant neoplasm of upper-outer quadrant of left female breast ([place]) 01/04/2015  . Nocturia   . Numbness and tingling    toes - bilateral  . Pneumonia   . PONV (postoperative nausea and vomiting)    Nausea   Past Surgical History:  Procedure Laterality Date  . BREAST LUMPECTOMY Bilateral    x3  . BREAST LUMPECTOMY WITH RADIOACTIVE SEED LOCALIZATION Right 08/23/2015   Procedure: RIGHT BREAST LUMPECTOMY WITH RADIOACTIVE SEED LOCALIZATION;  Surgeon: Alphonsa Overall, MD;  Location: [facility];  Service: General;  Laterality: Right;  . CESAREAN SECTION     x4  . MASTECTOMY Left 08/23/2015    RIGHT BREAST LUMPECTOMY WITH RADIOACTIVE SEED LOCALIZATION,  LEFT MASTECTOMY WITH AXILLARY LYMPH NODE DISSECTION  . MASTECTOMY WITH AXILLARY LYMPH NODE DISSECTION Left 08/23/2015   Procedure: LEFT MASTECTOMY WITH AXILLARY LYMPH NODE DISSECTION;  Surgeon: Alphonsa Overall, MD;  Location: [facility];  Service: General;  Laterality: Left;  [name] MULTIPLE TOOTH EXTRACTIONS       Allergies: Patient has no known allergies.  Medications: Prior to Admission medications   Medication Sig Start Date End Date Taking? Authorizing Provider  albuterol (PROVENTIL HFA;VENTOLIN HFA)  108 (90 Base) MCG/ACT inhaler Inhale 1-2 puffs into the lungs every 6 (six) hours as needed for wheezing or shortness of breath.   Yes [provider]  albuterol (PROVENTIL) (2.5 MG/3ML) 0.083% nebulizer solution Take 2.5 mg by nebulization every 6 (six) hours as needed for wheezing or shortness of breath.   Yes [provider]  ALPRAZolam (XANAX) 0.5 MG tablet Take 1 tablet (0.5 mg total) by mouth 3 (three) times daily as needed for anxiety or sleep. 08/13/15  Yes Nicholas Lose, MD  gabapentin (NEURONTIN) 300 MG capsule Take 1 capsule (300 mg total) by mouth 3 (three) times daily. 10/02/16  Yes Nicholas Lose, MD  metoprolol (LOPRESSOR) 50 MG tablet Take 0.5 tablets (25 mg total) by mouth 2 (two) times daily. 08/07/13  Yes [name], MD  oxyCODONE-acetaminophen (PERCOCET/ROXICET) 5-325 MG tablet Take 1 tablet by mouth every 4 (four) hours as needed for moderate pain or severe pain (1 tab every 4-6 hour prn pain). 10/02/16  Yes Nicholas Lose, MD  PARoxetine (PAXIL) 20 MG tablet Take 20 mg by mouth 2 (two) times daily.    Yes [provider]  capecitabine (XELODA) 500 MG tablet TAKE 2 TABLETS BY MOUTH TWICE A DAY AFTER A MEAL. TAKE FOR 14 DAYS AND THEN OFF FOR 1 WEEK 07/06/16   Nicholas Lose, MD  conjugated estrogens (PREMARIN) vaginal cream Place 1 Applicatorful vaginally daily. 04/13/16   Nicholas Lose, MD  cycloSPORINE (RESTASIS) 0.05 % ophthalmic emulsion Place 2 drops into both eyes 2 (two) times daily.    [provider]  diphenoxylate-atropine (LOMOTIL) 2.5-0.025 MG tablet Take 1 tablet by mouth 4 (four) times daily as needed for diarrhea or loose stools. 07/02/16   Nicholas Lose, MD  ibuprofen (ADVIL,MOTRIN) 200 MG tablet Take 200 mg by mouth every 6 (six) hours as needed. Reported on 03/22/2015    [provider]  magic mouthwash w/lidocaine SOLN Take 5 mLs by mouth 2 (two) times daily as needed for mouth pain.    [provider]  ondansetron (ZOFRAN)  8 MG tablet Take 1 tablet (8 mg total) by mouth every 8 (eight) hours as needed for nausea. 04/13/16   Nicholas Lose, MD  predniSONE (STERAPRED UNI-PAK 21 TAB) 10 MG (21) TBPK tablet Take by mouth daily. Take as directed. 10/06/16   Vanessa Kick, MD     Vital Signs: BP (!) 123/95 (BP Location: Right Arm)   Pulse (!) 106   Temp 97.9 F (36.6 C) (Oral)   Resp 16   SpO2 98%   Physical Exam awake/alert; chest- CTA bilat; clean, intact right chest wall Port-A-Cath; heart with slightly tachycardic but regular rhythm. Abdomen soft, positive bowel sounds, nontender. No lower extremity edema.  Imaging: No results found.  Labs:  CBC:  Recent Labs  05/25/16 1410 07/02/16 1511 07/06/16 1506 10/20/16 1153  WBC 4.0 3.9 3.6* 5.0  HGB 13.5 13.9 14.0 13.1  HCT 40.2 41.3 41.8 39.7  PLT 208 183 205 218    COAGS:  Recent Labs  10/20/16 1153  INR 0.88    BMP:  Recent Labs  05/25/16 1410 07/02/16 1511 07/06/16 1506 10/20/16 1153  NA 143 142 142 141  K 3.7 4.0 3.9 3.4*  CL  --   --   --  108  CO2 27 28 26 25   GLUCOSE 95 88 97 111*  BUN 10.8 10.5 12.8 13  CALCIUM 9.3 9.6 9.4 8.8*  CREATININE 0.8 0.8 0.8 0.64  GFRNONAA  --   --   --  >60  GFRAA  --   --   --  >60    LIVER FUNCTION TESTS:  Recent Labs  04/13/16 1350 05/25/16 1410 07/02/16 1511 07/06/16 1506  BILITOT 0.42 0.50 0.69 0.55  AST 14 14 13 14   ALT 12 8 9 10   ALKPHOS 62 65 62 59  PROT 6.5 6.8 7.0 7.0  ALBUMIN 3.8 4.0 3.9 3.9    Assessment and Plan: Patient with history of left breast carcinoma, status post surgery and chemoradiation. Prior right chest wall Port-A-Cath placed on 01/17/15. She has now completed treatment and presents today for Port-A-Cath removal. Details/risks of procedure, including but not limited to, internal bleeding, infection, injury to adjacent structures, discussed with patient with her understanding and consent.   Electronically Signed: D. Rowe Robert, PA-C 10/20/2016, 1:34  PM   I spent a total of 20 minutes at the the patient's bedside AND on the patient's hospital floor or unit, greater than 50% of which was counseling/coordinating care for port a cath removal

## 2016-10-20 NOTE — Discharge Instructions (Signed)
Moderate Conscious Sedation, Adult, Care After °These instructions provide you with information about caring for yourself after your procedure. Your health care provider may also give you more specific instructions. Your treatment has been planned according to current medical practices, but problems sometimes occur. Call your health care provider if you have any problems or questions after your procedure. °What can I expect after the procedure? °After your procedure, it is common: °· To feel sleepy for several hours. °· To feel clumsy and have poor balance for several hours. °· To have poor judgment for several hours. °· To vomit if you eat too soon. ° °Follow these instructions at home: °For at least 24 hours after the procedure: ° °· Do not: °? Participate in activities where you could fall or become injured. °? Drive. °? Use heavy machinery. °? Drink alcohol. °? Take sleeping pills or medicines that cause drowsiness. °? Make important decisions or sign legal documents. °? Take care of children on your own. °· Rest. °Eating and drinking °· Follow the diet recommended by your health care provider. °· If you vomit: °? Drink water, juice, or soup when you can drink without vomiting. °? Make sure you have little or no nausea before eating solid foods. °General instructions °· Have a responsible adult stay with you until you are awake and alert. °· Take over-the-counter and prescription medicines only as told by your health care provider. °· If you smoke, do not smoke without supervision. °· Keep all follow-up visits as told by your health care provider. This is important. °Contact a health care provider if: °· You keep feeling nauseous or you keep vomiting. °· You feel light-headed. °· You develop a rash. °· You have a fever. °Get help right away if: °· You have trouble breathing. °This information is not intended to replace advice given to you by your health care provider. Make sure you discuss any questions you have  with your health care provider. °Document Released: 10/12/2012 Document Revised: 05/27/2015 Document Reviewed: 04/13/2015 °Elsevier Interactive Patient Education © 2018 [employer] ° ° ° ° °Implanted Port Removal, Care After °Refer to this sheet in the next few weeks. These instructions provide you with information about caring for yourself after your procedure. Your health care provider may also give you more specific instructions. Your treatment has been planned according to current medical practices, but problems sometimes occur. Call your health care provider if you have any problems or questions after your procedure. °What can I expect after the procedure? °After the procedure, it is common to have: °· Soreness or pain near your incision. °· Some swelling or bruising near your incision. ° °Follow these instructions at home: °Medicines °· Take over-the-counter and prescription medicines only as told by your health care provider. °· If you were prescribed an antibiotic medicine, take it as told by your health care provider. Do not stop taking the antibiotic even if you start to feel better. °Bathing °· Do not take baths, swim, or use a hot tub until your health care provider approves. Ask your health care provider if you can take showers. You may only be allowed to take sponge baths for bathing. °Incision care °· Follow instructions from your health care provider about how to take care of your incision. Make sure you: °? Wash your hands with soap and water before you change your bandage (dressing). If soap and water are not available, use hand sanitizer. °? Change your dressing as told by your health care provider. °?   Keep your dressing dry. °? Leave stitches (sutures), skin glue, or adhesive strips in place. These skin closures may need to stay in place for 2 weeks or longer. If adhesive strip edges start to loosen and curl up, you may trim the loose edges. Do not remove adhesive strips completely unless your  health care provider tells you to do that. °· Check your incision area every day for signs of infection. Check for: °? More redness, swelling, or pain. °? More fluid or blood. °? Warmth. °? Pus or a bad smell. °Driving °· If you received a sedative, do not drive for 24 hours after the procedure. °· If you did not receive a sedative, ask your health care provider when it is safe to drive. °Activity °· Return to your normal activities as told by your health care provider. Ask your health care provider what activities are safe for you. °· Until your health care provider says it is safe: °? Do not lift anything that is heavier than 10 lb (4.5 kg). °? Do not do activities that involve lifting your arms over your head. °General instructions °· Do not use any tobacco products, such as cigarettes, chewing tobacco, and e-cigarettes. Tobacco can delay healing. If you need help quitting, ask your health care provider. °· Keep all follow-up visits as told by your health care provider. This is important. °Contact a health care provider if: °· You have more redness, swelling, or pain around your incision. °· You have more fluid or blood coming from your incision. °· Your incision feels warm to the touch. °· You have pus or a bad smell coming from your incision. °· You have a fever. °· You have pain that is not relieved by your pain medicine. °Get help right away if: °· You have chest pain. °· You have difficulty breathing. °This information is not intended to replace advice given to you by your health care provider. Make sure you discuss any questions you have with your health care provider. °Document Released: 12/03/2014 Document Revised: 05/30/2015 Document Reviewed: 09/26/2014 °Elsevier Interactive Patient Education © 2018 [employer] ° °

## 2016-11-16 MED FILL — PREMARIN VAGINAL CREAM-APPL: 0.625 | 15 days supply | Qty: 30 | Fill #4

## 2016-11-25 NOTE — Telephone Encounter (Signed)
error 

## 2016-12-24 MED FILL — PREMARIN VAGINAL CREAM-APPL: 0.625 | 15 days supply | Qty: 30 | Fill #5

## 2017-01-06 NOTE — Assessment & Plan Note (Signed)
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  Treatment summary: 1. Neoadjuvant chemotherapy with dose dense Adriamycin and Cytoxan 4 followed by Taxol and carboplatin 12 weekly from 01/18/2015 to 06/21/2015 2. left Mastectomy With axillary lymph node dissection 08/23/2015: Invasive ductal carcinoma with calcifications grade 3, 1.3 cm, image, margins negative, 1/6 lymph nodes positive with extracapsular extension, ER 0%, 0%, T1cN1a stage IIA 3. Plan: Adjuvant XRT 10/31/15 - 01/08/2016  Plan: Adj Capecitabine for 6 months Started 02/05/2016 .   Xeloda toxicities: 1. Occasional loose stools 2.mild nausea No evidence of hand-foot syndrome.  RTC in 3 weeks.

## 2017-01-07 ENCOUNTER — Other Ambulatory Visit (HOSPITAL_BASED_OUTPATIENT_CLINIC_OR_DEPARTMENT_OTHER): Payer: Medicare Other

## 2017-01-07 ENCOUNTER — Telehealth: Payer: Self-pay | Admitting: Hematology and Oncology

## 2017-01-07 ENCOUNTER — Ambulatory Visit (HOSPITAL_BASED_OUTPATIENT_CLINIC_OR_DEPARTMENT_OTHER): Payer: Medicare Other | Admitting: Hematology and Oncology

## 2017-01-07 DIAGNOSIS — C50412 Malignant neoplasm of upper-outer quadrant of left female breast: Secondary | ICD-10-CM

## 2017-01-07 DIAGNOSIS — Z171 Estrogen receptor negative status [ER-]: Principal | ICD-10-CM

## 2017-01-07 LAB — COMPREHENSIVE METABOLIC PANEL
ALT: 8 U/L (ref 0–55)
AST: 12 U/L (ref 5–34)
Albumin: 3.9 g/dL (ref 3.5–5.0)
Alkaline Phosphatase: 69 U/L (ref 40–150)
Anion Gap: 9 mEq/L (ref 3–11)
BILIRUBIN TOTAL: 0.4 mg/dL (ref 0.20–1.20)
BUN: 9.7 mg/dL (ref 7.0–26.0)
CO2: 26 meq/L (ref 22–29)
Calcium: 9.2 mg/dL (ref 8.4–10.4)
Chloride: 107 mEq/L (ref 98–109)
Creatinine: 0.8 mg/dL (ref 0.6–1.1)
EGFR: >60 mL/min/{1.73_m2} (ref >60)
GLUCOSE: 107 mg/dL (ref 70–140)
Potassium: 3.9 mEq/L (ref 3.5–5.1)
SODIUM: 142 meq/L (ref 136–145)
TOTAL PROTEIN: 7.4 g/dL (ref 6.4–8.3)

## 2017-01-07 LAB — CBC WITH DIFFERENTIAL/PLATELET
BASO%: 0.5 % (ref 0.0–2.0)
Basophils Absolute: 0 10*3/uL (ref 0.0–0.1)
EOS%: 3.4 % (ref 0.0–7.0)
Eosinophils Absolute: 0.1 10*3/uL (ref 0.0–0.5)
HCT: 43.5 % (ref 34.8–46.6)
HGB: 14 g/dL (ref 11.6–15.9)
LYMPH%: 29.5 % (ref 14.0–49.7)
MCH: 32.6 pg (ref 25.1–34.0)
MCHC: 32.2 g/dL (ref 31.5–36.0)
MCV: 101.2 fL — ABNORMAL HIGH (ref 79.5–101.0)
MONO#: 0.4 10*3/uL (ref 0.1–0.9)
MONO%: 9.2 % (ref 0.0–14.0)
NEUT%: 57.4 % (ref 38.4–76.8)
NEUTROS ABS: 2.2 10*3/uL (ref 1.5–6.5)
Platelets: 142 10*3/uL — ABNORMAL LOW (ref 145–400)
RBC: 4.3 10*6/uL (ref 3.70–5.45)
RDW: 13.1 % (ref 11.2–14.5)
WBC: 3.8 10*3/uL — AB (ref 3.9–10.3)
lymph#: 1.1 10*3/uL (ref 0.9–3.3)

## 2017-01-07 MED ORDER — GABAPENTIN 300 MG PO CAPS: 300.0000 mg | ORAL_CAPSULE | Freq: Three times a day (TID) | ORAL | 3 refills | Status: DC

## 2017-01-07 MED ORDER — OXYCODONE-ACETAMINOPHEN 5-325 MG PO TABS: 1.0000 | ORAL_TABLET | ORAL | 0 refills | Status: DC | PRN

## 2017-01-07 NOTE — Progress Notes (Signed)
Patient Care Team: [name], MD as PCP - General (Internal Medicine) Nicholas Lose, MD as Consulting Physician (Hematology and Oncology) Alphonsa Overall, MD as Consulting Physician (General Surgery) Delice Bison, Charlestine Massed, NP as Nurse Practitioner (Hematology and Oncology) Kyung Rudd, MD as Consulting Physician (Radiation Oncology)  DIAGNOSIS:  Encounter Diagnosis  Name Primary?  . 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      12/26/2014 Initial Diagnosis    Left breast biopsy: Invasive ductal carcinoma with lymphovascular invasion, 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, grade 3, ER/PR negative, Ki-67 80% HER-2 Neg      01/18/2015 - 06/21/2015 Neo-Adjuvant Chemotherapy    Dose dense Adriamycin and Cytoxan 4 followed by Taxol and carboplatin [id number]      07/03/2015 Breast MRI    Improvement in multiple areas of enhancement largest area 3.4 cm other suspicious nodules anterior to the mass also improved, left axillary internal mammary lymph nodes improved      08/23/2015 Surgery    Left mastectomy with axillary lymph node dissection: Invasive ductal carcinoma with calcifications grade 3, 1.3 cm, image, margins negative, 1/6 lymph nodes positive with extracapsular extension, ER 0%, 0%, T1cN1a stage II a       10/31/2015 - 01/08/2016 Radiation Therapy    Adj XRT Lisbeth Renshaw): Left Chest Wall and Left SCLV treated to 50.4 Gy in 28 fractions of 1.8 Gy. Left Chest Wall was then boosted to 60.4 Gy in 5 fractions of 2 Gy.      02/03/2016 - 08/03/2016 Chemotherapy    Xeloda 1500 mg by mouth  twice a day 2 weeks on one week off       CHIEF COMPLIANT: Surveillance of breast cancer  INTERVAL HISTORY: MADELAINE Mcintyre is a 62 year old with above-mentioned history of triple negative left breast cancer treated with neoadjuvant chemotherapy followed by mastectomy and radiation followed by Xeloda and is currently on surveillance and observation.  Patient reports no major problems or concerns.  She denies any pain or discomfort in the breast.  Denies any lumps or nodules in the breast.  REVIEW OF SYSTEMS:   Constitutional: Denies fevers, chills or abnormal weight loss Eyes: Denies blurriness of vision Ears, nose, mouth, throat, and face: Denies mucositis or sore throat Respiratory: Denies cough, dyspnea or wheezes Cardiovascular: Denies palpitation, chest discomfort Gastrointestinal:  Denies nausea, heartburn or change in bowel habits Skin: Denies abnormal skin rashes Lymphatics: Denies new lymphadenopathy or easy bruising Neurological:Denies numbness, tingling or new weaknesses Behavioral/Psych: Mood is stable, no new changes  Extremities: No lower extremity edema Breast: Left mastectomy All other systems were reviewed with the patient and are negative.  I have reviewed the past medical history, past surgical history, social history and family history with the patient and they are unchanged from previous note.  ALLERGIES:  has No Known Allergies.  MEDICATIONS:  Current Outpatient Medications  Medication Sig Dispense Refill  . albuterol (PROVENTIL HFA;VENTOLIN HFA) 108 (90 Base) MCG/ACT inhaler Inhale 1-2 puffs into the lungs every 6 (six) hours as needed for wheezing or shortness of breath.    [name]  albuterol (PROVENTIL) (2.5 MG/3ML) 0.083% nebulizer solution Take 2.5 mg by nebulization every 6 (six) hours as needed for wheezing or shortness of breath.    . ALPRAZolam (XANAX) 0.5 MG tablet Take 1 tablet (0.5 mg total) by mouth 3 (three) times daily as needed for anxiety or sleep.  60 tablet 0  . capecitabine (XELODA) 500 MG tablet TAKE 2 TABLETS BY MOUTH TWICE A DAY AFTER A MEAL. TAKE FOR 14 DAYS AND THEN OFF FOR 1 WEEK 56 tablet 0  . conjugated estrogens (PREMARIN) vaginal cream Place 1 Applicatorful vaginally daily. 42.5 g 12  . cycloSPORINE (RESTASIS) 0.05 % ophthalmic emulsion Place 2 drops into both eyes 2 (two) times daily.    . diphenoxylate-atropine (LOMOTIL) 2.5-0.025 MG tablet Take 1 tablet by mouth 4 (four) times daily as needed for diarrhea or loose stools. 30 tablet 0  . gabapentin (NEURONTIN) 300 MG capsule Take 1 capsule (300 mg total) by mouth 3 (three) times daily. 90 capsule 2  . ibuprofen (ADVIL,MOTRIN) 200 MG tablet Take 200 mg by mouth every 6 (six) hours as needed. Reported on 03/22/2015    . magic mouthwash w/lidocaine SOLN Take 5 mLs by mouth 2 (two) times daily as needed for mouth pain.    . metoprolol (LOPRESSOR) 50 MG tablet Take 0.5 tablets (25 mg total) by mouth 2 (two) times daily.    . ondansetron (ZOFRAN) 8 MG tablet Take 1 tablet (8 mg total) by mouth every 8 (eight) hours as needed for nausea. 30 tablet 3  . oxyCODONE-acetaminophen (PERCOCET/ROXICET) 5-325 MG tablet Take 1 tablet by mouth every 4 (four) hours as needed for moderate pain or severe pain (1 tab every 4-6 hour prn pain). 60 tablet 0  . PARoxetine (PAXIL) 20 MG tablet Take 20 mg by mouth 2 (two) times daily.     . predniSONE (STERAPRED UNI-PAK 21 TAB) 10 MG (21) TBPK tablet Take by mouth daily. Take as directed. 21 tablet 0   No current facility-administered medications for this visit.    Facility-Administered Medications Ordered in Other Visits  Medication Dose Route Frequency Provider Last Rate Last Dose  . sodium chloride 0.9 % injection 10 mL  10 mL Intravenous PRN Nicholas Lose, MD   10 mL at 05/25/16 1440    PHYSICAL EXAMINATION: ECOG PERFORMANCE STATUS: 1 - Symptomatic but completely ambulatory  There were no vitals filed for this visit. There were no vitals filed  for this visit.  GENERAL:alert, no distress and comfortable SKIN: skin color, texture, turgor are normal, no rashes or significant lesions EYES: normal, Conjunctiva are pink and non-injected, sclera clear OROPHARYNX:no exudate, no erythema and lips, buccal mucosa, and tongue normal  NECK: supple, thyroid normal size, non-tender, without nodularity LYMPH:  no palpable lymphadenopathy in the cervical, axillary or inguinal LUNGS: clear to auscultation and percussion with normal breathing effort HEART: regular rate & rhythm and no murmurs and no lower extremity edema ABDOMEN:abdomen soft, non-tender and normal bowel sounds MUSCULOSKELETAL:no cyanosis of digits and no clubbing  NEURO: alert & oriented x 3 with fluent speech, no focal motor/sensory deficits EXTREMITIES: No lower extremity edema   LABORATORY DATA:  I have reviewed the data as listed   Chemistry      Component Value Date/Time   NA 141 10/20/2016 1153   NA 142 07/06/2016 1506   K 3.4 (L) 10/20/2016 1153   K 3.9 07/06/2016 1506   CL 108 10/20/2016 1153   CO2 25 10/20/2016 1153   CO2 26 07/06/2016 1506  BUN 13 10/20/2016 1153   BUN 12.8 07/06/2016 1506   CREATININE 0.64 10/20/2016 1153   CREATININE 0.8 07/06/2016 1506      Component Value Date/Time   CALCIUM 8.8 (L) 10/20/2016 1153   CALCIUM 9.4 07/06/2016 1506   ALKPHOS 59 07/06/2016 1506   AST 14 07/06/2016 1506   ALT 10 07/06/2016 1506   BILITOT 0.55 07/06/2016 1506       Lab Results  Component Value Date   WBC 5.0 10/20/2016   HGB 13.1 10/20/2016   HCT 39.7 10/20/2016   MCV 101.0 (H) 10/20/2016   PLT 218 10/20/2016   NEUTROABS 3.1 10/20/2016    ASSESSMENT & PLAN:  Breast cancer of upper-outer quadrant of left female breast ([place]) Left breast biopsy12/21/16: inflammatory breast cancerInvasive ductal carcinoma with lymphovascular invasion, 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, grade 3, ER/PR negative, Her 2 Negative Left  breast palpable mass at 2:00: 4 x 3.8 x 2.5 cm, enlarged left axillary lymph node 4 cm in size with diffuse skin thickening T4 N1 (Stage 3B).  Treatment summary: 1. Neoadjuvant chemotherapy with dose dense Adriamycin and Cytoxan 4 followed by Taxol and carboplatin 12 weekly from 01/18/2015 to 06/21/2015 2. left Mastectomy With axillary lymph node dissection 08/23/2015: Invasive ductal carcinoma with calcifications grade 3, 1.3 cm, image, margins negative, 1/6 lymph nodes positive with extracapsular extension, ER 0%, 0%, T1cN1a stage IIA 3. Plan: Adjuvant XRT 10/31/15 - 01/08/2016  Plan: Adj Capecitabine for 6 months Started 02/05/2016 .   Xeloda toxicities: 1. Occasional loose stools 2.mild nausea No evidence of hand-foot syndrome.  RTC in 3 weeks.   I spent 25 minutes talking to the patient of which more than half was spent in counseling and coordination of care.  No orders of the defined types were placed in this encounter.  The patient has a good understanding of the overall plan. she agrees with it. she will call with any problems that may develop before the next visit here.   Harriette Ohara, MD 01/07/17

## 2017-01-07 NOTE — Telephone Encounter (Signed)
Gave patient AVs and calendar of upcoming June appointments. °

## 2017-02-04 ENCOUNTER — Inpatient Hospital Stay (HOSPITAL_COMMUNITY)
Admission: EM | Admit: 2017-02-04 | Discharge: 2017-02-10 | DRG: 194 | Disposition: A | Discharge status: Home / Self Care | Payer: Medicare Other | Attending: Cardiovascular Disease | Admitting: Cardiovascular Disease

## 2017-02-04 ENCOUNTER — Other Ambulatory Visit: Payer: Self-pay

## 2017-02-04 ENCOUNTER — Encounter (HOSPITAL_COMMUNITY): Payer: Self-pay | Admitting: Emergency Medicine

## 2017-02-04 ENCOUNTER — Emergency Department (HOSPITAL_COMMUNITY): Payer: Medicare Other

## 2017-02-04 DIAGNOSIS — D638 Anemia in other chronic diseases classified elsewhere: Secondary | ICD-10-CM | POA: Diagnosis present

## 2017-02-04 DIAGNOSIS — N179 Acute kidney failure, unspecified: Secondary | ICD-10-CM | POA: Diagnosis present

## 2017-02-04 DIAGNOSIS — Z23 Encounter for immunization: Secondary | ICD-10-CM

## 2017-02-04 DIAGNOSIS — Z6832 Body mass index (BMI) 32.0-32.9, adult: Secondary | ICD-10-CM

## 2017-02-04 DIAGNOSIS — J440 Chronic obstructive pulmonary disease with acute lower respiratory infection: Secondary | ICD-10-CM | POA: Diagnosis present

## 2017-02-04 DIAGNOSIS — Z9221 Personal history of antineoplastic chemotherapy: Secondary | ICD-10-CM

## 2017-02-04 DIAGNOSIS — Z79899 Other long term (current) drug therapy: Secondary | ICD-10-CM | POA: Diagnosis not present

## 2017-02-04 DIAGNOSIS — D696 Thrombocytopenia, unspecified: Secondary | ICD-10-CM | POA: Diagnosis present

## 2017-02-04 DIAGNOSIS — Z9010 Acquired absence of unspecified breast and nipple: Secondary | ICD-10-CM | POA: Diagnosis not present

## 2017-02-04 DIAGNOSIS — J181 Lobar pneumonia, unspecified organism: Secondary | ICD-10-CM

## 2017-02-04 DIAGNOSIS — R000 Tachycardia, unspecified: Secondary | ICD-10-CM | POA: Diagnosis present

## 2017-02-04 DIAGNOSIS — F17210 Nicotine dependence, cigarettes, uncomplicated: Secondary | ICD-10-CM | POA: Diagnosis present

## 2017-02-04 DIAGNOSIS — J189 Pneumonia, unspecified organism: Principal | ICD-10-CM | POA: Diagnosis present

## 2017-02-04 DIAGNOSIS — E876 Hypokalemia: Secondary | ICD-10-CM | POA: Diagnosis present

## 2017-02-04 DIAGNOSIS — Z853 Personal history of malignant neoplasm of breast: Secondary | ICD-10-CM | POA: Diagnosis not present

## 2017-02-04 DIAGNOSIS — I10 Essential (primary) hypertension: Secondary | ICD-10-CM | POA: Diagnosis present

## 2017-02-04 LAB — BASIC METABOLIC PANEL
ANION GAP: 11 (ref 5–15)
BUN: 28 mg/dL — ABNORMAL HIGH (ref 6–20)
CO2: 23 mmol/L (ref 22–32)
Calcium: 8.1 mg/dL — ABNORMAL LOW (ref 8.9–10.3)
Chloride: 100 mmol/L — ABNORMAL LOW (ref 101–111)
Creatinine, Ser: 2.61 mg/dL — ABNORMAL HIGH (ref 0.44–1.00)
GFR calc Af Amer: 22 mL/min — ABNORMAL LOW (ref >60)
GFR, EST NON AFRICAN AMERICAN: 19 mL/min — AB (ref >60)
GLUCOSE: 94 mg/dL (ref 65–99)
POTASSIUM: 3.2 mmol/L — AB (ref 3.5–5.1)
Sodium: 134 mmol/L — ABNORMAL LOW (ref 135–145)

## 2017-02-04 LAB — CBC WITH DIFFERENTIAL/PLATELET
BASOS ABS: 0 10*3/uL (ref 0.0–0.1)
Basophils Relative: 0 %
Eosinophils Absolute: 0 10*3/uL (ref 0.0–0.7)
Eosinophils Relative: 0 %
HEMATOCRIT: 36.5 % (ref 36.0–46.0)
Hemoglobin: 12.3 g/dL (ref 12.0–15.0)
LYMPHS PCT: 4 %
Lymphs Abs: 0.5 10*3/uL — ABNORMAL LOW (ref 0.7–4.0)
MCH: 33.1 pg (ref 26.0–34.0)
MCHC: 33.7 g/dL (ref 30.0–36.0)
MCV: 98.1 fL (ref 78.0–100.0)
Monocytes Absolute: 0.7 10*3/uL (ref 0.1–1.0)
Monocytes Relative: 5 %
Neutro Abs: 12.5 10*3/uL — ABNORMAL HIGH (ref 1.7–7.7)
Neutrophils Relative %: 91 %
Platelets: 96 10*3/uL — ABNORMAL LOW (ref 150–400)
RBC: 3.72 MIL/uL — ABNORMAL LOW (ref 3.87–5.11)
RDW: 12.9 % (ref 11.5–15.5)
WBC: 13.8 10*3/uL — AB (ref 4.0–10.5)

## 2017-02-04 LAB — I-STAT TROPONIN, ED: Troponin i, poc: 0.05 ng/mL (ref 0.00–0.08)

## 2017-02-04 MED ORDER — ALBUTEROL SULFATE (2.5 MG/3ML) 0.083% IN NEBU
2.5000 mg | INHALATION_SOLUTION | Freq: Four times a day (QID) | RESPIRATORY_TRACT | Status: AC
  Administered 2017-02-04 – 2017-02-06 (×6): 2.5 mg via RESPIRATORY_TRACT
  Filled 2017-02-04 (×6): qty 3

## 2017-02-04 MED ORDER — METOPROLOL TARTRATE 25 MG PO TABS
25.0000 mg | ORAL_TABLET | Freq: Two times a day (BID) | ORAL | Status: DC
  Administered 2017-02-05 – 2017-02-10 (×10): 25 mg via ORAL
  Filled 2017-02-04 (×12): qty 1

## 2017-02-04 MED ORDER — DEXTROSE 5 % IV SOLN
500.0000 mg | Freq: Once | INTRAVENOUS | Status: AC
  Administered 2017-02-04: 500 mg via INTRAVENOUS
  Filled 2017-02-04: qty 500

## 2017-02-04 MED ORDER — ONDANSETRON HCL 4 MG PO TABS
8.0000 mg | ORAL_TABLET | Freq: Three times a day (TID) | ORAL | Status: DC | PRN
  Administered 2017-02-07: 8 mg via ORAL
  Filled 2017-02-04: qty 2

## 2017-02-04 MED ORDER — SODIUM CHLORIDE 0.9 % IV BOLUS (SEPSIS)
1000.0000 mL | Freq: Once | INTRAVENOUS | Status: AC
  Administered 2017-02-04: 1000 mL via INTRAVENOUS

## 2017-02-04 MED ORDER — DEXTROSE 5 % IV SOLN
1.0000 g | INTRAVENOUS | Status: DC
  Administered 2017-02-05 – 2017-02-09 (×5): 1 g via INTRAVENOUS
  Filled 2017-02-04 (×6): qty 10

## 2017-02-04 MED ORDER — PAROXETINE HCL 20 MG PO TABS
20.0000 mg | ORAL_TABLET | Freq: Two times a day (BID) | ORAL | Status: DC
  Administered 2017-02-05 – 2017-02-10 (×12): 20 mg via ORAL
  Filled 2017-02-04 (×13): qty 1

## 2017-02-04 MED ORDER — FENTANYL CITRATE (PF) 100 MCG/2ML IJ SOLN
50.0000 ug | Freq: Once | INTRAMUSCULAR | Status: AC
  Administered 2017-02-04: 50 ug via INTRAVENOUS
  Filled 2017-02-04: qty 2

## 2017-02-04 MED ORDER — DEXTROSE 5 % IV SOLN
1.0000 g | Freq: Once | INTRAVENOUS | Status: AC
  Administered 2017-02-04: 1 g via INTRAVENOUS
  Filled 2017-02-04: qty 10

## 2017-02-04 MED ORDER — DEXTROSE 5 % IV SOLN
500.0000 mg | INTRAVENOUS | Status: DC
  Administered 2017-02-05: 500 mg via INTRAVENOUS
  Filled 2017-02-04: qty 500

## 2017-02-04 MED ORDER — ONDANSETRON HCL 4 MG/2ML IJ SOLN
4.0000 mg | Freq: Once | INTRAMUSCULAR | Status: AC
  Administered 2017-02-04: 4 mg via INTRAVENOUS
  Filled 2017-02-04: qty 2

## 2017-02-04 MED ORDER — GABAPENTIN 300 MG PO CAPS
300.0000 mg | ORAL_CAPSULE | Freq: Three times a day (TID) | ORAL | Status: DC
  Administered 2017-02-05 – 2017-02-10 (×16): 300 mg via ORAL
  Filled 2017-02-04 (×16): qty 1

## 2017-02-04 MED ORDER — ALBUTEROL SULFATE (2.5 MG/3ML) 0.083% IN NEBU: 3.0000 mL | INHALATION_SOLUTION | Freq: Four times a day (QID) | RESPIRATORY_TRACT | Status: DC | PRN

## 2017-02-04 MED ORDER — SODIUM CHLORIDE 0.9 % IV SOLN
INTRAVENOUS | Status: DC
  Administered 2017-02-04: 23:00:00 via INTRAVENOUS

## 2017-02-04 MED ORDER — HEPARIN SODIUM (PORCINE) 5000 UNIT/ML IJ SOLN
5000.0000 [IU] | Freq: Three times a day (TID) | INTRAMUSCULAR | Status: DC
  Administered 2017-02-04 – 2017-02-10 (×17): 5000 [IU] via SUBCUTANEOUS
  Filled 2017-02-04 (×17): qty 1

## 2017-02-04 MED ORDER — OXYCODONE-ACETAMINOPHEN 5-325 MG PO TABS
1.0000 | ORAL_TABLET | ORAL | Status: DC | PRN
  Administered 2017-02-04 – 2017-02-10 (×18): 1 via ORAL
  Filled 2017-02-04 (×18): qty 1

## 2017-02-04 NOTE — ED Provider Notes (Signed)
[facility] EMERGENCY DEPARTMENT Provider Note   CSN: [id number] Arrival date & time: 02/04/17  1933     History   Chief Complaint No chief complaint on file.   HPI Deborah Mcintyre is a 62 y.o. female.  Patient presents to the emergency department with a chief complaint of chest pain.  She states that the symptoms started last night.  She reports associated pain to her left shoulder.  She reports lightheadedness and nausea with standing.  She reports associated fever, chills, and productive cough.  She denies having taken anything for symptoms.  She denies any history of PE or DVT.  She states that last week she did have a swollen left arm, which was painful.  She denies any traumatic injury.  She states the swelling and pain has resolved.  She denies any other associated symptoms.   The history is provided by the patient. No language interpreter was used.    Past Medical History:  Diagnosis Date  . Anxiety   . Arthritis   . Bronchitis   . COPD (chronic obstructive pulmonary disease) ([place])   . Depression   . Hypertension   . Malignant neoplasm of upper-outer quadrant of left female breast ([place]) 01/04/2015  . Nocturia   . Numbness and tingling    toes - bilateral  . Pneumonia   . PONV (postoperative nausea and vomiting)    Nausea    Patient Active Problem List   Diagnosis Date Noted  . Antineoplastic chemotherapy induced pancytopenia ([place]) 04/26/2015  . Antineoplastic chemotherapy induced anemia 04/05/2015  . Breast cancer of upper-outer quadrant of left female breast ([place]) 01/01/2015  . Acute exacerbation of chronic bronchitis ([place]) 08/03/2013    Past Surgical History:  Procedure Laterality Date  . BREAST LUMPECTOMY Bilateral    x3  . BREAST LUMPECTOMY WITH RADIOACTIVE SEED LOCALIZATION Right 08/23/2015   Procedure: RIGHT BREAST LUMPECTOMY WITH RADIOACTIVE SEED LOCALIZATION;  Surgeon: Alphonsa Overall, MD;  Location: [facility];  Service: General;   Laterality: Right;  . CESAREAN SECTION     x4  . IR REMOVAL TUN ACCESS W/ PORT W/O FL MOD SED  10/20/2016  . MASTECTOMY Left 08/23/2015    RIGHT BREAST LUMPECTOMY WITH RADIOACTIVE SEED LOCALIZATION,  LEFT MASTECTOMY WITH AXILLARY LYMPH NODE DISSECTION  . MASTECTOMY WITH AXILLARY LYMPH NODE DISSECTION Left 08/23/2015   Procedure: LEFT MASTECTOMY WITH AXILLARY LYMPH NODE DISSECTION;  Surgeon: Alphonsa Overall, MD;  Location: [facility];  Service: General;  Laterality: Left;  [name] MULTIPLE TOOTH EXTRACTIONS      OB History    No data available       Home Medications    Prior to Admission medications   Medication Sig Start Date End Date Taking? Authorizing Provider  albuterol (PROVENTIL HFA;VENTOLIN HFA) 108 (90 Base) MCG/ACT inhaler Inhale 1-2 puffs into the lungs every 6 (six) hours as needed for wheezing or shortness of breath.    [provider]  albuterol (PROVENTIL) (2.5 MG/3ML) 0.083% nebulizer solution Take 2.5 mg by nebulization every 6 (six) hours as needed for wheezing or shortness of breath.    [provider]  ALPRAZolam Duanne Moron) 0.5 MG tablet Take 1 tablet (0.5 mg total) by mouth 3 (three) times daily as needed for anxiety or sleep. 08/13/15   Nicholas Lose, MD  conjugated estrogens (PREMARIN) vaginal cream Place 1 Applicatorful vaginally daily. 04/13/16   Nicholas Lose, MD  cycloSPORINE (RESTASIS) 0.05 % ophthalmic emulsion Place 2 drops into both eyes 2 (two) times  daily.    [provider]  diphenoxylate-atropine (LOMOTIL) 2.5-0.025 MG tablet Take 1 tablet by mouth 4 (four) times daily as needed for diarrhea or loose stools. 07/02/16   Nicholas Lose, MD  gabapentin (NEURONTIN) 300 MG capsule Take 1 capsule (300 mg total) by mouth 3 (three) times daily. 01/07/17   Nicholas Lose, MD  ibuprofen (ADVIL,MOTRIN) 200 MG tablet Take 200 mg by mouth every 6 (six) hours as needed. Reported on 03/22/2015    [provider]  magic mouthwash w/lidocaine SOLN Take 5 mLs by  mouth 2 (two) times daily as needed for mouth pain.    [provider]  metoprolol (LOPRESSOR) 50 MG tablet Take 0.5 tablets (25 mg total) by mouth 2 (two) times daily. 08/07/13   [name], MD  ondansetron (ZOFRAN) 8 MG tablet Take 1 tablet (8 mg total) by mouth every 8 (eight) hours as needed for nausea. 04/13/16   Nicholas Lose, MD  oxyCODONE-acetaminophen (PERCOCET/ROXICET) 5-325 MG tablet Take 1 tablet by mouth every 4 (four) hours as needed for moderate pain or severe pain (1 tab every 4-6 hour prn pain). 01/07/17   Nicholas Lose, MD  PARoxetine (PAXIL) 20 MG tablet Take 20 mg by mouth 2 (two) times daily.     [provider]  predniSONE (STERAPRED UNI-PAK 21 TAB) 10 MG (21) TBPK tablet Take by mouth daily. Take as directed. 10/06/16   Vanessa Kick, MD    Family History History reviewed. No pertinent family history.  Social History Social History   Tobacco Use  . Smoking status: Current Every Day Smoker    Packs/day: 0.75    Years: 45.00    Pack years: 33.75    Types: Cigarettes  . Smokeless tobacco: Never Used  Substance Use Topics  . Alcohol use: Yes    Alcohol/week: 0.0 oz    Comment: ocassionally  . Drug use: No     Allergies   Patient has no known allergies.   Review of Systems Review of Systems  All other systems reviewed and are negative.    Physical Exam Updated Vital Signs BP 102/74   Pulse (!) 122   Temp 99.6 F (37.6 C) (Oral)   Resp 16   Ht 5' 7.5" (1.715 m)   Wt 93 kg (205 lb)   SpO2 97%   BMI 31.63 kg/m   Physical Exam  Constitutional: She is oriented to person, place, and time. She appears well-developed and well-nourished.  HENT:  Head: Normocephalic and atraumatic.  Eyes: Conjunctivae and EOM are normal. Pupils are equal, round, and reactive to light.  Neck: Normal range of motion. Neck supple.  Cardiovascular: Regular rhythm. Exam reveals no gallop and no friction rub.  No murmur heard. Tachycardia  Pulmonary/Chest:  Effort normal and breath sounds normal. No respiratory distress. She has no wheezes. She has no rales. She exhibits no tenderness.  CTAB  Abdominal: Soft. Bowel sounds are normal. She exhibits no distension and no mass. There is no tenderness. There is no rebound and no guarding.  Musculoskeletal: Normal range of motion. She exhibits no edema or tenderness.  Neurological: She is alert and oriented to person, place, and time.  Skin: Skin is warm and dry.  Psychiatric: She has a normal mood and affect. Her behavior is normal. Judgment and thought content normal.  Nursing note and vitals reviewed.    ED Treatments / Results  Labs (all labs ordered are listed, but only abnormal results are displayed) Labs Reviewed  CBC WITH  DIFFERENTIAL/PLATELET - Abnormal; Notable for the following components:      Result Value   WBC 13.8 (*)    RBC 3.72 (*)    Platelets 96 (*)    Neutro Abs 12.5 (*)    Lymphs Abs 0.5 (*)    All other components within normal limits  BASIC METABOLIC PANEL - Abnormal; Notable for the following components:   Sodium 134 (*)    Potassium 3.2 (*)    Chloride 100 (*)    BUN 28 (*)    Creatinine, Ser 2.61 (*)    Calcium 8.1 (*)    GFR calc non Af Amer 19 (*)    GFR calc Af Amer 22 (*)    All other components within normal limits  CULTURE, BLOOD (ROUTINE X 2)  CULTURE, BLOOD (ROUTINE X 2)  INFLUENZA PANEL BY PCR (TYPE A & B)  I-STAT TROPONIN, ED    EKG  EKG Interpretation None       Radiology Dg Chest 2 View  Result Date: 02/04/2017 CLINICAL DATA:  Cough EXAM: CHEST  2 VIEW COMPARISON:  08/15/2014 FINDINGS: Consolidation noted in the left lower lobe compatible with pneumonia. Heart is normal size. Patchy opacity in the inferior right upper lobe on the frontal view may also reflect an area of pneumonia. No effusions or acute bony abnormality. IMPRESSION: Left lower lobe pneumonia and possible early right upper lobe pneumonia. Electronically Signed   By: Rolm Baptise M.D.   On: 02/04/2017 21:15    Procedures Procedures (including critical care time)  Medications Ordered in ED Medications  cefTRIAXone (ROCEPHIN) 1 g in dextrose 5 % 50 mL IVPB (not administered)  azithromycin (ZITHROMAX) 500 mg in dextrose 5 % 250 mL IVPB (not administered)  fentaNYL (SUBLIMAZE) injection 50 mcg (50 mcg Intravenous Given 02/04/17 2139)  ondansetron (ZOFRAN) injection 4 mg (4 mg Intravenous Given 02/04/17 2138)  sodium chloride 0.9 % bolus 1,000 mL (1,000 mLs Intravenous New Bag/Given 02/04/17 2138)  sodium chloride 0.9 % bolus 1,000 mL (1,000 mLs Intravenous New Bag/Given 02/04/17 2138)     Initial Impression / Assessment and Plan / ED Course  I have reviewed the triage vital signs and the nursing notes.  Pertinent labs & imaging results that were available during my care of the patient were reviewed by me and considered in my medical decision making (see chart for details).     Patient with chest pain and shortness of breath.  She reports associated cough and fever.  She is noted to be tachycardic and having blood pressures in the low 100s.  Will give to fluid boluses.  She has no history of CHF.  Lungs are clear to auscultation.  Will check chest x-ray.  Also have a degree of suspicion for influenza.    Chest x-ray shows left-sided pneumonia and possible right-sided pneumonia.  Additionally, there is significant increase in her creatinine.  She will need fluids and admission to the hospital.  I discussed the case with Dr. Doylene Canard, who will admit the patient.  Final Clinical Impressions(s) / ED Diagnoses   Final diagnoses:  Community acquired pneumonia of left lower lobe of lung ([place])  Acute renal failure, unspecified acute renal failure type [facility])    ED Discharge Orders    None       Montine Circle, PA-C 02/04/17 2208    Pixie Casino, MD 02/04/17 2243

## 2017-02-04 NOTE — ED Notes (Signed)
Patient transported to X-ray 

## 2017-02-04 NOTE — H&P (Signed)
Referring Physician:  DRISANA SCHWEICKERT is an 62 y.o. female.                       Chief Complaint: Cough and chest pain  HPI: 61 year old female with hypertension, Left breat cancer with mastectomy, COPD and arthritis has  Left sided chest pain with fever and cough. Chest x-ray showed left lung pneumonia and early right lung pneumonia.   Past Medical History:  Diagnosis Date  . Anxiety   . Arthritis   . Bronchitis   . COPD (chronic obstructive pulmonary disease) ([place])   . Depression   . Hypertension   . Malignant neoplasm of upper-outer quadrant of left female breast ([place]) 01/04/2015  . Nocturia   . Numbness and tingling    toes - bilateral  . Pneumonia   . PONV (postoperative nausea and vomiting)    Nausea      Past Surgical History:  Procedure Laterality Date  . BREAST LUMPECTOMY Bilateral    x3  . BREAST LUMPECTOMY WITH RADIOACTIVE SEED LOCALIZATION Right 08/23/2015   Procedure: RIGHT BREAST LUMPECTOMY WITH RADIOACTIVE SEED LOCALIZATION;  Surgeon: Alphonsa Overall, MD;  Location: [facility];  Service: General;  Laterality: Right;  . CESAREAN SECTION     x4  . IR REMOVAL TUN ACCESS W/ PORT W/O FL MOD SED  10/20/2016  . MASTECTOMY Left 08/23/2015    RIGHT BREAST LUMPECTOMY WITH RADIOACTIVE SEED LOCALIZATION,  LEFT MASTECTOMY WITH AXILLARY LYMPH NODE DISSECTION  . MASTECTOMY WITH AXILLARY LYMPH NODE DISSECTION Left 08/23/2015   Procedure: LEFT MASTECTOMY WITH AXILLARY LYMPH NODE DISSECTION;  Surgeon: Alphonsa Overall, MD;  Location: [facility];  Service: General;  Laterality: Left;  [name] MULTIPLE TOOTH EXTRACTIONS      History reviewed. No pertinent family history. Social History:  reports that she has been smoking cigarettes.  She has a 33.75 pack-year smoking history. she has never used smokeless tobacco. She reports that she drinks alcohol. She reports that she does not use drugs.  Allergies: No Known Allergies   (Not in a hospital admission)  Results for orders placed or performed  during the hospital encounter of 02/04/17 (from the past 48 hour(s))  CBC with Differential/Platelet     Status: Abnormal   Collection Time: 02/04/17  8:41 PM  Result Value Ref Range   WBC 13.8 (H) 4.0 - 10.5 K/uL   RBC 3.72 (L) 3.87 - 5.11 MIL/uL   Hemoglobin 12.3 12.0 - 15.0 g/dL   HCT 36.5 36.0 - 46.0 %   MCV 98.1 78.0 - 100.0 fL   MCH 33.1 26.0 - 34.0 pg   MCHC 33.7 30.0 - 36.0 g/dL   RDW 12.9 11.5 - 15.5 %   Platelets 96 (L) 150 - 400 K/uL    Comment: REPEATED TO VERIFY SPECIMEN CHECKED FOR CLOTS PLATELET COUNT CONFIRMED BY SMEAR    Neutrophils Relative % 91 %   Neutro Abs 12.5 (H) 1.7 - 7.7 K/uL   Lymphocytes Relative 4 %   Lymphs Abs 0.5 (L) 0.7 - 4.0 K/uL   Monocytes Relative 5 %   Monocytes Absolute 0.7 0.1 - 1.0 K/uL   Eosinophils Relative 0 %   Eosinophils Absolute 0.0 0.0 - 0.7 K/uL   Basophils Relative 0 %   Basophils Absolute 0.0 0.0 - 0.1 K/uL  Basic metabolic panel     Status: Abnormal   Collection Time: 02/04/17  8:41 PM  Result Value Ref Range   Sodium 134 (L) 135 -  145 mmol/L   Potassium 3.2 (L) 3.5 - 5.1 mmol/L   Chloride 100 (L) 101 - 111 mmol/L   CO2 23 22 - 32 mmol/L   Glucose, Bld 94 65 - 99 mg/dL   BUN 28 (H) 6 - 20 mg/dL   Creatinine, Ser 2.61 (H) 0.44 - 1.00 mg/dL   Calcium 8.1 (L) 8.9 - 10.3 mg/dL   GFR calc non Af Amer 19 (L) >60 mL/min   GFR calc Af Amer 22 (L) >60 mL/min    Comment: (NOTE) The eGFR has been calculated using the CKD EPI equation. This calculation has not been validated in all clinical situations. eGFR's persistently <60 mL/min signify possible Chronic Kidney Disease.    Anion gap 11 5 - 15  I-stat troponin, ED     Status: None   Collection Time: 02/04/17  8:54 PM  Result Value Ref Range   Troponin i, poc 0.05 0.00 - 0.08 ng/mL   Comment 3            Comment: Due to the release kinetics of cTnI, a negative result within the first hours of the onset of symptoms does not rule out myocardial infarction with  certainty. If myocardial infarction is still suspected, repeat the test at appropriate intervals.    Dg Chest 2 View  Result Date: 02/04/2017 CLINICAL DATA:  Cough EXAM: CHEST  2 VIEW COMPARISON:  08/15/2014 FINDINGS: Consolidation noted in the left lower lobe compatible with pneumonia. Heart is normal size. Patchy opacity in the inferior right upper lobe on the frontal view may also reflect an area of pneumonia. No effusions or acute bony abnormality. IMPRESSION: Left lower lobe pneumonia and possible early right upper lobe pneumonia. Electronically Signed   By: Rolm Baptise M.D.   On: 02/04/2017 21:15    Review Of Systems Constitutional: No fever, chills, weight loss or gain. Eyes: No vision change, wears glasses. No discharge or pain. Ears: No hearing loss, No tinnitus. Respiratory: No asthma, COPD, Positive pneumonias. Positive shortness of breath. No hemoptysis. Cardiovascular: Positive chest pain, no palpitation, leg edema. Gastrointestinal: No nausea, vomiting, diarrhea, constipation. No GI bleed. No hepatitis. Genitourinary: No dysuria, hematuria, kidney stone. No incontinance. Neurological: No headache, stroke, seizures.  Psychiatry: No psych facility admission for anxiety, depression, suicide. No detox. Skin: No rash. Musculoskeletal: Positive joint pain, no fibromyalgia. No neck pain, positive back pain. Lymphadenopathy: No lymphadenopathy. Hematology: No anemia or easy bruising.   Blood pressure (!) 117/96, pulse (!) 117, temperature 99.6 F (37.6 C), temperature source Oral, resp. rate (!) 24, height 5' 7.5" (1.715 m), weight 93 kg (205 lb), SpO2 97 %. Body mass index is 31.63 kg/m. General appearance: alert, cooperative, appears stated age and no distress Head: Normocephalic, atraumatic. Eyes: Brown eyes, pink conjunctiva, corneas clear. PERRL, EOM's intact. Neck: No adenopathy, no carotid bruit, no JVD, supple, symmetrical, trachea midline and thyroid not  enlarged. Resp: Rhonchi left lower lung. Cardio: Tachycardic, S1, S2 normal, II/VI systolic murmur, no click, rub or gallop GI: Soft, non-tender; bowel sounds normal; no organomegaly. Extremities: No edema, cyanosis or clubbing. Skin: Warm and dry.  Neurologic: Alert and oriented X 3, normal strength. Normal coordination and gait.  Assessment/Plan Left lung pneumonia Possible right lung pneumonia S/P left breat cancer Hypertension Obesity COPD Acute renal failure  Admit/Oxygen/antibiotics/IV fluids.  Birdie Riddle, MD  02/04/2017, 10:41 PM

## 2017-02-04 NOTE — ED Triage Notes (Signed)
Pt coming from home by ems. C/o left sided chest pain that goes to the back, nausea, dizziness with standing. Pt hasn't eaten or drank anything since Tuesday when this all started. When ems arrived pt bp was 100/70 with HR 140's, pt was then given 324asa and 250ml bolus which increased bp to 115/64 and HR down to 120. Ems did not give nitro.

## 2017-02-05 ENCOUNTER — Encounter (HOSPITAL_COMMUNITY): Payer: Self-pay | Admitting: General Practice

## 2017-02-05 LAB — BASIC METABOLIC PANEL
Anion gap: 12 (ref 5–15)
BUN: 26 mg/dL — AB (ref 6–20)
CALCIUM: 7.7 mg/dL — AB (ref 8.9–10.3)
CO2: 20 mmol/L — AB (ref 22–32)
Chloride: 104 mmol/L (ref 101–111)
Creatinine, Ser: 1.92 mg/dL — ABNORMAL HIGH (ref 0.44–1.00)
GFR calc Af Amer: 31 mL/min — ABNORMAL LOW (ref >60)
GFR calc non Af Amer: 27 mL/min — ABNORMAL LOW (ref >60)
Glucose, Bld: 119 mg/dL — ABNORMAL HIGH (ref 65–99)
POTASSIUM: 3.1 mmol/L — AB (ref 3.5–5.1)
Sodium: 136 mmol/L (ref 135–145)

## 2017-02-05 LAB — CBC
HCT: 35.3 % — ABNORMAL LOW (ref 36.0–46.0)
HEMATOCRIT: 33.9 % — AB (ref 36.0–46.0)
Hemoglobin: 11.2 g/dL — ABNORMAL LOW (ref 12.0–15.0)
Hemoglobin: 11.8 g/dL — ABNORMAL LOW (ref 12.0–15.0)
MCH: 32.4 pg (ref 26.0–34.0)
MCH: 32.9 pg (ref 26.0–34.0)
MCHC: 33 g/dL (ref 30.0–36.0)
MCHC: 33.4 g/dL (ref 30.0–36.0)
MCV: 98 fL (ref 78.0–100.0)
MCV: 98.3 fL (ref 78.0–100.0)
PLATELETS: 93 10*3/uL — AB (ref 150–400)
Platelets: 82 10*3/uL — ABNORMAL LOW (ref 150–400)
RBC: 3.46 MIL/uL — ABNORMAL LOW (ref 3.87–5.11)
RBC: 3.59 MIL/uL — ABNORMAL LOW (ref 3.87–5.11)
RDW: 13 % (ref 11.5–15.5)
RDW: 13.1 % (ref 11.5–15.5)
WBC: 12.6 10*3/uL — ABNORMAL HIGH (ref 4.0–10.5)
WBC: 13.4 10*3/uL — AB (ref 4.0–10.5)

## 2017-02-05 LAB — INFLUENZA PANEL BY PCR (TYPE A & B)
INFLBPCR: NEGATIVE
Influenza A By PCR: NEGATIVE

## 2017-02-05 LAB — CREATININE, SERUM
Creatinine, Ser: 2.08 mg/dL — ABNORMAL HIGH (ref 0.44–1.00)
GFR, EST AFRICAN AMERICAN: 28 mL/min — AB (ref >60)
GFR, EST NON AFRICAN AMERICAN: 25 mL/min — AB (ref >60)

## 2017-02-05 LAB — HIV ANTIBODY (ROUTINE TESTING W REFLEX): HIV SCREEN 4TH GENERATION: NONREACTIVE

## 2017-02-05 MED ORDER — SODIUM CHLORIDE 0.9 % IV BOLUS (SEPSIS)
200.0000 mL | Freq: Once | INTRAVENOUS | Status: AC
  Administered 2017-02-05: 200 mL via INTRAVENOUS

## 2017-02-05 MED ORDER — POTASSIUM CHLORIDE IN NACL 20-0.9 MEQ/L-% IV SOLN
INTRAVENOUS | Status: DC
Start: 2017-02-05 — End: 2017-02-08
  Administered 2017-02-05 – 2017-02-08 (×4): via INTRAVENOUS
  Filled 2017-02-05 (×5): qty 1000

## 2017-02-05 MED ORDER — ACETAMINOPHEN 325 MG PO TABS
650.0000 mg | ORAL_TABLET | Freq: Four times a day (QID) | ORAL | Status: DC | PRN
  Administered 2017-02-05: 650 mg via ORAL
  Filled 2017-02-05: qty 2

## 2017-02-05 MED ORDER — INFLUENZA VAC SPLIT QUAD 0.5 ML IM SUSY
0.5000 mL | PREFILLED_SYRINGE | INTRAMUSCULAR | Status: AC
  Administered 2017-02-08: 0.5 mL via INTRAMUSCULAR
  Filled 2017-02-05: qty 0.5

## 2017-02-05 MED ORDER — ALPRAZOLAM 0.5 MG PO TABS
0.5000 mg | ORAL_TABLET | Freq: Two times a day (BID) | ORAL | Status: DC | PRN
  Administered 2017-02-05 – 2017-02-06 (×2): 0.5 mg via ORAL
  Filled 2017-02-05 (×2): qty 1

## 2017-02-05 MED ORDER — POTASSIUM CHLORIDE ER 10 MEQ PO TBCR
10.0000 meq | EXTENDED_RELEASE_TABLET | Freq: Two times a day (BID) | ORAL | Status: DC
  Administered 2017-02-05 – 2017-02-07 (×5): 10 meq via ORAL
  Filled 2017-02-05 (×10): qty 1

## 2017-02-05 NOTE — Progress Notes (Signed)
New order received from [facility] for 200 cc IVF normal saline bolus over 30 minutes and to recheck blood pressure. This nurse will start IVF bolus and report off to oncoming nurse for blood pressure recheck.

## 2017-02-05 NOTE — ED Notes (Signed)
Pt requesting prn xanax, admitting MD paged

## 2017-02-05 NOTE — Progress Notes (Signed)
Patient arrived to unit [facility] bed 09 from ER. Oriented patient to nursing unit and call bed system.Paient instructed not to get out of bed without assistance from nursing staff and verbalized understanding.Patient blood pressure 81/52 in left arm and 90/50 in right arm.Patient does complain of being dizzy just lying in bed .Call placed to Dr.Kadakia awaiting a response.

## 2017-02-05 NOTE — ED Notes (Signed)
Patient sleeping at present.

## 2017-02-05 NOTE — Progress Notes (Signed)
Ref: [name], MD   Subjective:  Dizzy at times. Lower blood pressure than usual. IV fluid bolus given. Mildly hypokalemic. T max 100.1 degree F.  Objective:  Vital Signs in the last 24 hours: Temp:  [98.8 F (37.1 C)-100.1 F (37.8 C)] 98.8 F (37.1 C) (02/01 0645) Pulse Rate:  [94-126] 106 (02/01 0934) Cardiac Rhythm: Sinus tachycardia (01/31 2006) Resp:  [16-24] 24 (01/31 2330) BP: (81-160)/(50-96) 94/67 (02/01 0934) SpO2:  [90 %-100 %] 92 % (02/01 0830) Weight:  [93 kg (205 lb)] 93 kg (205 lb) (01/31 2002)  Physical Exam: BP Readings from Last 1 Encounters:  02/05/17 94/67     Wt Readings from Last 1 Encounters:  02/04/17 93 kg (205 lb)    Weight change:  Body mass index is 31.63 kg/m. HEENT: [place]/AT, Eyes-Brown, PERL, EOMI, Conjunctiva-Pink, Sclera-Non-icteric Neck: No JVD, No bruit, Trachea midline. Lungs:  Rhonchi, Bilateral. Cardiac:  Regular rhythm, normal S1 and S2, no S3. II/VI systolic murmur. Abdomen:  Soft, non-tender. BS present. Extremities:  No edema present. No cyanosis. No clubbing. CNS: AxOx3, Cranial nerves grossly intact, moves all 4 extremities.  Skin: Warm and dry.   Intake/Output from previous day: 01/31 0701 - 02/01 0700 In: 2250 [IV Piggyback:2250] Out: -     Lab Results: BMET    Component Value Date/Time   NA 136 02/05/2017 0308   NA 134 (L) 02/04/2017 2041   NA 142 01/07/2017 1308   NA 141 10/20/2016 1153   NA 142 07/06/2016 1506   NA 142 07/02/2016 1511   K 3.1 (L) 02/05/2017 0308   K 3.2 (L) 02/04/2017 2041   K 3.9 01/07/2017 1308   K 3.4 (L) 10/20/2016 1153   K 3.9 07/06/2016 1506   K 4.0 07/02/2016 1511   CL 104 02/05/2017 0308   CL 100 (L) 02/04/2017 2041   CL 108 10/20/2016 1153   CO2 20 (L) 02/05/2017 0308   CO2 23 02/04/2017 2041   CO2 26 01/07/2017 1308   CO2 25 10/20/2016 1153   CO2 26 07/06/2016 1506   CO2 28 07/02/2016 1511   GLUCOSE 119 (H) 02/05/2017 0308   GLUCOSE 94 02/04/2017 2041   GLUCOSE 107  01/07/2017 1308   GLUCOSE 111 (H) 10/20/2016 1153   GLUCOSE 97 07/06/2016 1506   GLUCOSE 88 07/02/2016 1511   BUN 26 (H) 02/05/2017 0308   BUN 28 (H) 02/04/2017 2041   BUN 9.7 01/07/2017 1308   BUN 13 10/20/2016 1153   BUN 12.8 07/06/2016 1506   BUN 10.5 07/02/2016 1511   CREATININE 1.92 (H) 02/05/2017 0308   CREATININE 2.08 (H) 02/05/2017 0032   CREATININE 2.61 (H) 02/04/2017 2041   CREATININE 0.8 01/07/2017 1308   CREATININE 0.8 07/06/2016 1506   CREATININE 0.8 07/02/2016 1511   CALCIUM 7.7 (L) 02/05/2017 0308   CALCIUM 8.1 (L) 02/04/2017 2041   CALCIUM 9.2 01/07/2017 1308   CALCIUM 8.8 (L) 10/20/2016 1153   CALCIUM 9.4 07/06/2016 1506   CALCIUM 9.6 07/02/2016 1511   GFRNONAA 27 (L) 02/05/2017 0308   GFRNONAA 25 (L) 02/05/2017 0032   GFRNONAA 19 (L) 02/04/2017 2041   GFRAA 31 (L) 02/05/2017 0308   GFRAA 28 (L) 02/05/2017 0032   GFRAA 22 (L) 02/04/2017 2041   CBC    Component Value Date/Time   WBC 12.6 (H) 02/05/2017 0308   RBC 3.46 (L) 02/05/2017 0308   HGB 11.2 (L) 02/05/2017 0308   HGB 14.0 01/07/2017 1308   HCT 33.9 (L) 02/05/2017 0308  HCT 43.5 01/07/2017 1308   PLT 82 (L) 02/05/2017 0308   PLT 142 (L) 01/07/2017 1308   MCV 98.0 02/05/2017 0308   MCV 101.2 (H) 01/07/2017 1308   MCH 32.4 02/05/2017 0308   MCHC 33.0 02/05/2017 0308   RDW 13.1 02/05/2017 0308   RDW 13.1 01/07/2017 1308   LYMPHSABS 0.5 (L) 02/04/2017 2041   LYMPHSABS 1.1 01/07/2017 1308   MONOABS 0.7 02/04/2017 2041   MONOABS 0.4 01/07/2017 1308   EOSABS 0.0 02/04/2017 2041   EOSABS 0.1 01/07/2017 1308   BASOSABS 0.0 02/04/2017 2041   BASOSABS 0.0 01/07/2017 1308   HEPATIC Function Panel Recent Labs    07/02/16 1511 07/06/16 1506 01/07/17 1308  PROT 7.0 7.0 7.4   HEMOGLOBIN A1C No components found for: HGA1C,  MPG CARDIAC ENZYMES No results found for: CKTOTAL, CKMB, CKMBINDEX, TROPONINI BNP No results for input(s): PROBNP in the last 8760 hours. TSH No results for input(s): TSH  in the last 8760 hours. CHOLESTEROL No results for input(s): CHOL in the last 8760 hours.  Scheduled Meds: . albuterol  2.5 mg Nebulization Q6H  . gabapentin  300 mg Oral TID  . heparin  5,000 Units Subcutaneous Q8H  . [START ON 02/06/2017] Influenza vac split quadrivalent PF  0.5 mL Intramuscular Tomorrow-1000  . metoprolol tartrate  25 mg Oral BID  . PARoxetine  20 mg Oral BID  . potassium chloride  10 mEq Oral BID   Continuous Infusions: . 0.9 % NaCl with KCl 20 mEq / L    . azithromycin    . cefTRIAXone (ROCEPHIN)  IV     PRN Meds:.acetaminophen, albuterol, ALPRAZolam, ondansetron, oxyCODONE-acetaminophen  Assessment/Plan: Left lung pneumonia Early right lung pneumonia S/P left breast cancer Hypertension Obesity COPD Acute renal failure/Prerenal azotemia-improving Hypokalemia  Continue IV fluids. Potassium supplement as needed.   LOS: 1 day    [name]  MD  02/05/2017, 9:55 AM

## 2017-02-06 MED ORDER — AZITHROMYCIN 500 MG PO TABS
500.0000 mg | ORAL_TABLET | Freq: Every day | ORAL | Status: AC
  Administered 2017-02-06 – 2017-02-08 (×3): 500 mg via ORAL
  Filled 2017-02-06 (×3): qty 1

## 2017-02-06 NOTE — Progress Notes (Signed)
Subjective:  Complains of pleuritic left-sided chest pain associated with coughing. Overall feels little better denies shortness of breath. Denies hemoptysis  Objective:  Vital Signs in the last 24 hours: Temp:  [98.1 F (36.7 C)-98.6 F (37 C)] 98.1 F (36.7 C) (02/02 0549) Pulse Rate:  [91-97] 93 (02/02 0549) BP: (90-107)/(54-66) 107/65 (02/02 0549) SpO2:  [96 %-100 %] 100 % (02/02 0549)  Intake/Output from previous day: 02/01 0701 - 02/02 0700 In: 1975 [P.O.:210; I.V.:1465; IV Piggyback:300] Out: -  Intake/Output from this shift: No intake/output data recorded.  Physical Exam: Neck: no adenopathy, no carotid bruit, no JVD and supple, symmetrical, trachea midline Lungs: Bilateral rhonchi noted Heart: regular rate and rhythm, S1, S2 normal and Soft systolic murmur noted no pericardial rub Abdomen: soft, non-tender; bowel sounds normal; no masses,  no organomegaly Extremities: extremities normal, atraumatic, no cyanosis or edema  Lab Results: Recent Labs    02/05/17 0032 02/05/17 0308  WBC 13.4* 12.6*  HGB 11.8* 11.2*  PLT 93* 82*   Recent Labs    02/04/17 2041 02/05/17 0032 02/05/17 0308  NA 134*  --  136  K 3.2*  --  3.1*  CL 100*  --  104  CO2 23  --  20*  GLUCOSE 94  --  119*  BUN 28*  --  26*  CREATININE 2.61* 2.08* 1.92*   No results for input(s): TROPONINI in the last 72 hours.  Invalid input(s): CK, MB Hepatic Function Panel No results for input(s): PROT, ALBUMIN, AST, ALT, ALKPHOS, BILITOT, BILIDIR, IBILI in the last 72 hours. No results for input(s): CHOL in the last 72 hours. No results for input(s): PROTIME in the last 72 hours.  Imaging: Imaging results have been reviewed and Dg Chest 2 View  Result Date: 02/04/2017 CLINICAL DATA:  Cough EXAM: CHEST  2 VIEW COMPARISON:  08/15/2014 FINDINGS: Consolidation noted in the left lower lobe compatible with pneumonia. Heart is normal size. Patchy opacity in the inferior right upper lobe on the frontal  view may also reflect an area of pneumonia. No effusions or acute bony abnormality. IMPRESSION: Left lower lobe pneumonia and possible early right upper lobe pneumonia. Electronically Signed   By: Rolm Baptise M.D.   On: 02/04/2017 21:15    Cardiac Studies:  Assessment/Plan:  Resolving bilateral pneumonia Hypertension COPD Acute kidney injury Hypokalemia Morbid obesity History of left breast cancer Plan Continue present management Check labs in a.m.   LOS: 2 days    Charolette Forward 02/06/2017, 9:34 AM

## 2017-02-07 LAB — CBC
HEMATOCRIT: 32.3 % — AB (ref 36.0–46.0)
Hemoglobin: 10.5 g/dL — ABNORMAL LOW (ref 12.0–15.0)
MCH: 32.9 pg (ref 26.0–34.0)
MCHC: 32.5 g/dL (ref 30.0–36.0)
MCV: 101.3 fL — AB (ref 78.0–100.0)
Platelets: 49 10*3/uL — ABNORMAL LOW (ref 150–400)
RBC: 3.19 MIL/uL — AB (ref 3.87–5.11)
RDW: 13.5 % (ref 11.5–15.5)
WBC: 6.5 10*3/uL (ref 4.0–10.5)

## 2017-02-07 LAB — BASIC METABOLIC PANEL
ANION GAP: 7 (ref 5–15)
BUN: 14 mg/dL (ref 6–20)
CHLORIDE: 110 mmol/L (ref 101–111)
CO2: 26 mmol/L (ref 22–32)
Calcium: 8.6 mg/dL — ABNORMAL LOW (ref 8.9–10.3)
Creatinine, Ser: 0.85 mg/dL (ref 0.44–1.00)
GFR calc Af Amer: >60 mL/min (ref >60)
GFR calc non Af Amer: >60 mL/min (ref >60)
GLUCOSE: 88 mg/dL (ref 65–99)
POTASSIUM: 4.8 mmol/L (ref 3.5–5.1)
Sodium: 143 mmol/L (ref 135–145)

## 2017-02-07 MED ORDER — FERROUS SULFATE 325 (65 FE) MG PO TABS
325.0000 mg | ORAL_TABLET | Freq: Three times a day (TID) | ORAL | Status: DC
  Administered 2017-02-07 – 2017-02-10 (×10): 325 mg via ORAL
  Filled 2017-02-07 (×10): qty 1

## 2017-02-07 NOTE — Progress Notes (Signed)
Subjective:  Patient denies any chest pain states breathing and coughing has improved. Renal function is back to normal. Objective:  Vital Signs in the last 24 hours: Temp:  [97.7 F (36.5 C)-98 F (36.7 C)] 98 F (36.7 C) (02/03 0445) Pulse Rate:  [83-92] 87 (02/03 0445) Resp:  [16] 16 (02/02 1312) BP: (123-136)/(82-87) 130/87 (02/03 0445) SpO2:  [96 %-100 %] 100 % (02/03 0445)  Intake/Output from previous day: 02/02 0701 - 02/03 0700 In: 1000 [I.V.:1000] Out: -  Intake/Output from this shift: No intake/output data recorded.  Physical Exam: Neck: no adenopathy, no carotid bruit, no JVD and supple, symmetrical, trachea midline Lungs: Decreased breath sound at bases with occasional left lung rhonchi Heart: regular rate and rhythm, S1, S2 normal and Soft systolic murmur noted Abdomen: soft, non-tender; bowel sounds normal; no masses,  no organomegaly Extremities: extremities normal, atraumatic, no cyanosis or edema  Lab Results: Recent Labs    02/05/17 0308 02/07/17 0603  WBC 12.6* 6.5  HGB 11.2* 10.5*  PLT 82* 49*   Recent Labs    02/05/17 0308 02/07/17 0603  NA 136 143  K 3.1* 4.8  CL 104 110  CO2 20* 26  GLUCOSE 119* 88  BUN 26* 14  CREATININE 1.92* 0.85   No results for input(s): TROPONINI in the last 72 hours.  Invalid input(s): CK, MB Hepatic Function Panel No results for input(s): PROT, ALBUMIN, AST, ALT, ALKPHOS, BILITOT, BILIDIR, IBILI in the last 72 hours. No results for input(s): CHOL in the last 72 hours. No results for input(s): PROTIME in the last 72 hours.  Imaging: Imaging results have been reviewed and No results found.  Cardiac Studies:  Assessment/Plan:  Resolving bilateral pneumonia Hypertension COPD Status post Acute kidney injury Status post Hypokalemia Morbid obesity History of left breast cancer Acute on chronic anemia secondary to hydration rule out GI loss Plan As per orders Check stool for occult blood Start Feosol DC  potassium Continue IV antibiotics Check cultures  LOS: 3 days    Charolette Forward 02/07/2017, 11:02 AM

## 2017-02-07 NOTE — Plan of Care (Signed)
  Nutrition: Adequate nutrition will be maintained 02/07/2017 1350 - Progressing by Williams Che, RN   Pain Managment: General experience of comfort will improve 02/07/2017 1350 - Progressing by Williams Che, RN   Safety: Ability to remain free from injury will improve 02/07/2017 1350 - Progressing by Williams Che, RN

## 2017-02-07 NOTE — Care Management Note (Signed)
Case Management Note  Patient Details  Name: Deborah Mcintyre MRN: [id number] Date of Birth: [birth year]  Subjective/Objective:        Pt presents for history of COPD with bilateral penumonia.  Pt from home with daughter.  Pt states has nebulizer and has no other equipment.  Pt drives self to appointments and has no problem getting prescriptions.      Action/Plan: No CM needs at this time.  Will continue to follow.  Expected Discharge Date:                  Expected Discharge Plan:  Home/Self Care  In-House Referral:  NA  Discharge planning Services  CM Consult  Post Acute Care Choice:    Choice offered to:     DME Arranged:    DME Agency:     HH Arranged:    HH Agency:     Status of Service:  In process, will continue to follow  If discussed at Long Length of Stay Meetings, dates discussed:    Additional Comments:  Arley Phenix, RN 02/07/2017, 3:25 PM

## 2017-02-08 LAB — CBC
HCT: 36.4 % (ref 36.0–46.0)
Hemoglobin: 11.7 g/dL — ABNORMAL LOW (ref 12.0–15.0)
MCH: 32.1 pg (ref 26.0–34.0)
MCHC: 32.1 g/dL (ref 30.0–36.0)
MCV: 100 fL (ref 78.0–100.0)
PLATELETS: 69 10*3/uL — AB (ref 150–400)
RBC: 3.64 MIL/uL — ABNORMAL LOW (ref 3.87–5.11)
RDW: 13.3 % (ref 11.5–15.5)
WBC: 4.9 10*3/uL (ref 4.0–10.5)

## 2017-02-08 MED ORDER — AMLODIPINE BESYLATE 5 MG PO TABS
5.0000 mg | ORAL_TABLET | Freq: Every day | ORAL | Status: DC
  Administered 2017-02-09 – 2017-02-10 (×3): 5 mg via ORAL
  Filled 2017-02-08 (×3): qty 1

## 2017-02-08 MED ORDER — ALBUTEROL SULFATE (2.5 MG/3ML) 0.083% IN NEBU
3.0000 mL | INHALATION_SOLUTION | Freq: Four times a day (QID) | RESPIRATORY_TRACT | Status: DC
  Administered 2017-02-08 (×2): 3 mL via RESPIRATORY_TRACT
  Filled 2017-02-08 (×2): qty 3

## 2017-02-08 MED ORDER — ALBUTEROL SULFATE (2.5 MG/3ML) 0.083% IN NEBU: 3.0000 mL | INHALATION_SOLUTION | Freq: Four times a day (QID) | RESPIRATORY_TRACT | Status: DC | PRN

## 2017-02-08 NOTE — Care Management Important Message (Signed)
Important Message  Patient Details  Name: Deborah Mcintyre MRN: [id number] Date of Birth: 10/08/1955   Medicare Important Message Given:  Yes    Stirling Orton P [place] 02/08/2017, 3:52 PM

## 2017-02-08 NOTE — Progress Notes (Signed)
Ref: [name], MD   Subjective:  Cough is present. Afebrile. Improved renal function and potassium level.  Objective:  Vital Signs in the last 24 hours: Temp:  [97.9 F (36.6 C)-98.2 F (36.8 C)] 98.2 F (36.8 C) (02/04 0557) Pulse Rate:  [79-88] 88 (02/04 0557) Resp:  [18-23] 18 (02/04 0557) BP: (145-163)/(92-103) 163/103 (02/04 0557) SpO2:  [95 %-100 %] 95 % (02/04 0557)  Physical Exam: BP Readings from Last 1 Encounters:  02/08/17 (!) 163/103     Wt Readings from Last 1 Encounters:  02/04/17 93 kg (205 lb)    Weight change:  Body mass index is 31.63 kg/m. HEENT: [place]/AT, Eyes-Brown, PERL, EOMI, Conjunctiva-Pink, Sclera-Non-icteric Neck: No JVD, No bruit, Trachea midline. Lungs:  Clearing with mild forced expiratory wheezing, Bilateral. Cardiac:  Regular rhythm, normal S1 and S2, no S3. II/VI systolic murmur. Abdomen:  Soft, non-tender. BS present. Extremities:  No edema present. No cyanosis. No clubbing. CNS: AxOx3, Cranial nerves grossly intact, moves all 4 extremities.  Skin: Warm and dry.   Intake/Output from previous day: No intake/output data recorded.    Lab Results: BMET    Component Value Date/Time   NA 143 02/07/2017 0603   NA 136 02/05/2017 0308   NA 134 (L) 02/04/2017 2041   NA 142 01/07/2017 1308   NA 142 07/06/2016 1506   NA 142 07/02/2016 1511   K 4.8 02/07/2017 0603   K 3.1 (L) 02/05/2017 0308   K 3.2 (L) 02/04/2017 2041   K 3.9 01/07/2017 1308   K 3.9 07/06/2016 1506   K 4.0 07/02/2016 1511   CL 110 02/07/2017 0603   CL 104 02/05/2017 0308   CL 100 (L) 02/04/2017 2041   CO2 26 02/07/2017 0603   CO2 20 (L) 02/05/2017 0308   CO2 23 02/04/2017 2041   CO2 26 01/07/2017 1308   CO2 26 07/06/2016 1506   CO2 28 07/02/2016 1511   GLUCOSE 88 02/07/2017 0603   GLUCOSE 119 (H) 02/05/2017 0308   GLUCOSE 94 02/04/2017 2041   GLUCOSE 107 01/07/2017 1308   GLUCOSE 97 07/06/2016 1506   GLUCOSE 88 07/02/2016 1511   BUN 14 02/07/2017 0603   BUN 26 (H) 02/05/2017 0308   BUN 28 (H) 02/04/2017 2041   BUN 9.7 01/07/2017 1308   BUN 12.8 07/06/2016 1506   BUN 10.5 07/02/2016 1511   CREATININE 0.85 02/07/2017 0603   CREATININE 1.92 (H) 02/05/2017 0308   CREATININE 2.08 (H) 02/05/2017 0032   CREATININE 0.8 01/07/2017 1308   CREATININE 0.8 07/06/2016 1506   CREATININE 0.8 07/02/2016 1511   CALCIUM 8.6 (L) 02/07/2017 0603   CALCIUM 7.7 (L) 02/05/2017 0308   CALCIUM 8.1 (L) 02/04/2017 2041   CALCIUM 9.2 01/07/2017 1308   CALCIUM 9.4 07/06/2016 1506   CALCIUM 9.6 07/02/2016 1511   GFRNONAA >60 02/07/2017 0603   GFRNONAA 27 (L) 02/05/2017 0308   GFRNONAA 25 (L) 02/05/2017 0032   GFRAA >60 02/07/2017 0603   GFRAA 31 (L) 02/05/2017 0308   GFRAA 28 (L) 02/05/2017 0032   CBC    Component Value Date/Time   WBC 6.5 02/07/2017 0603   RBC 3.19 (L) 02/07/2017 0603   HGB 10.5 (L) 02/07/2017 0603   HGB 14.0 01/07/2017 1308   HCT 32.3 (L) 02/07/2017 0603   HCT 43.5 01/07/2017 1308   PLT 49 (L) 02/07/2017 0603   PLT 142 (L) 01/07/2017 1308   MCV 101.3 (H) 02/07/2017 0603   MCV 101.2 (H) 01/07/2017 1308  MCH 32.9 02/07/2017 0603   MCHC 32.5 02/07/2017 0603   RDW 13.5 02/07/2017 0603   RDW 13.1 01/07/2017 1308   LYMPHSABS 0.5 (L) 02/04/2017 2041   LYMPHSABS 1.1 01/07/2017 1308   MONOABS 0.7 02/04/2017 2041   MONOABS 0.4 01/07/2017 1308   EOSABS 0.0 02/04/2017 2041   EOSABS 0.1 01/07/2017 1308   BASOSABS 0.0 02/04/2017 2041   BASOSABS 0.0 01/07/2017 1308   HEPATIC Function Panel Recent Labs    07/02/16 1511 07/06/16 1506 01/07/17 1308  PROT 7.0 7.0 7.4   HEMOGLOBIN A1C No components found for: HGA1C,  MPG CARDIAC ENZYMES No results found for: CKTOTAL, CKMB, CKMBINDEX, TROPONINI BNP No results for input(s): PROBNP in the last 8760 hours. TSH No results for input(s): TSH in the last 8760 hours. CHOLESTEROL No results for input(s): CHOL in the last 8760 hours.  Scheduled Meds: . azithromycin  500 mg Oral Daily  .  ferrous sulfate  325 mg Oral TID WC  . gabapentin  300 mg Oral TID  . heparin  5,000 Units Subcutaneous Q8H  . Influenza vac split quadrivalent PF  0.5 mL Intramuscular Tomorrow-1000  . metoprolol tartrate  25 mg Oral BID  . PARoxetine  20 mg Oral BID   Continuous Infusions: . 0.9 % NaCl with KCl 20 mEq / L 100 mL/hr at 02/07/17 1154  . cefTRIAXone (ROCEPHIN)  IV Stopped (02/07/17 1624)   PRN Meds:.acetaminophen, albuterol, ALPRAZolam, ondansetron, oxyCODONE-acetaminophen  Assessment/Plan: Left lung pneumonia Early right lung pneumonia S/P left breast cancer Hypertension Obesity COPD Acute renal failure, improved Hypokalemia- improved  Increase activity/   LOS: 4 days    [name]  MD  02/08/2017, 10:08 AM

## 2017-02-09 NOTE — Progress Notes (Signed)
Ref: [name], MD   Subjective:  Weakness. No chest pain. Positive cough. Afebrile.  Objective:  Vital Signs in the last 24 hours: Temp:  [98 F (36.7 C)-98.2 F (36.8 C)] 98 F (36.7 C) (02/05 0635) Pulse Rate:  [70-75] 70 (02/05 0635) Resp:  [18-20] 20 (02/05 0635) BP: (152-162)/(91-96) 154/91 (02/05 0635) SpO2:  [96 %-98 %] 97 % (02/05 0635) FiO2 (%):  [99 %] 99 % (02/04 1957) Weight:  [96.9 kg (213 lb 10 oz)] 96.9 kg (213 lb 10 oz) (02/05 1941)  Physical Exam: BP Readings from Last 1 Encounters:  02/09/17 (!) 154/91     Wt Readings from Last 1 Encounters:  02/09/17 96.9 kg (213 lb 10 oz)    Weight change:  Body mass index is 32.96 kg/m. HEENT: [place]/AT, Eyes-Brown, PERL, EOMI, Conjunctiva-Pink, Sclera-Non-icteric Neck: No JVD, No bruit, Trachea midline. Lungs:  Clearing, Bilateral. Cardiac:  Regular rhythm, normal S1 and S2, no S3. II/VI systolic murmur. Abdomen:  Soft, non-tender. BS present. Extremities:  No edema present. No cyanosis. No clubbing. CNS: AxOx3, Cranial nerves grossly intact, moves all 4 extremities.  Skin: Warm and dry.   Intake/Output from previous day: 02/04 0701 - 02/05 0700 In: 720 [P.O.:720] Out: -     Lab Results: BMET    Component Value Date/Time   NA 143 02/07/2017 0603   NA 136 02/05/2017 0308   NA 134 (L) 02/04/2017 2041   NA 142 01/07/2017 1308   NA 142 07/06/2016 1506   NA 142 07/02/2016 1511   K 4.8 02/07/2017 0603   K 3.1 (L) 02/05/2017 0308   K 3.2 (L) 02/04/2017 2041   K 3.9 01/07/2017 1308   K 3.9 07/06/2016 1506   K 4.0 07/02/2016 1511   CL 110 02/07/2017 0603   CL 104 02/05/2017 0308   CL 100 (L) 02/04/2017 2041   CO2 26 02/07/2017 0603   CO2 20 (L) 02/05/2017 0308   CO2 23 02/04/2017 2041   CO2 26 01/07/2017 1308   CO2 26 07/06/2016 1506   CO2 28 07/02/2016 1511   GLUCOSE 88 02/07/2017 0603   GLUCOSE 119 (H) 02/05/2017 0308   GLUCOSE 94 02/04/2017 2041   GLUCOSE 107 01/07/2017 1308   GLUCOSE 97  07/06/2016 1506   GLUCOSE 88 07/02/2016 1511   BUN 14 02/07/2017 0603   BUN 26 (H) 02/05/2017 0308   BUN 28 (H) 02/04/2017 2041   BUN 9.7 01/07/2017 1308   BUN 12.8 07/06/2016 1506   BUN 10.5 07/02/2016 1511   CREATININE 0.85 02/07/2017 0603   CREATININE 1.92 (H) 02/05/2017 0308   CREATININE 2.08 (H) 02/05/2017 0032   CREATININE 0.8 01/07/2017 1308   CREATININE 0.8 07/06/2016 1506   CREATININE 0.8 07/02/2016 1511   CALCIUM 8.6 (L) 02/07/2017 0603   CALCIUM 7.7 (L) 02/05/2017 0308   CALCIUM 8.1 (L) 02/04/2017 2041   CALCIUM 9.2 01/07/2017 1308   CALCIUM 9.4 07/06/2016 1506   CALCIUM 9.6 07/02/2016 1511   GFRNONAA >60 02/07/2017 0603   GFRNONAA 27 (L) 02/05/2017 0308   GFRNONAA 25 (L) 02/05/2017 0032   GFRAA >60 02/07/2017 0603   GFRAA 31 (L) 02/05/2017 0308   GFRAA 28 (L) 02/05/2017 0032   CBC    Component Value Date/Time   WBC 4.9 02/08/2017 0849   RBC 3.64 (L) 02/08/2017 0849   HGB 11.7 (L) 02/08/2017 0849   HGB 14.0 01/07/2017 1308   HCT 36.4 02/08/2017 0849   HCT 43.5 01/07/2017 1308   PLT 69 (  L) 02/08/2017 0849   PLT 142 (L) 01/07/2017 1308   MCV 100.0 02/08/2017 0849   MCV 101.2 (H) 01/07/2017 1308   MCH 32.1 02/08/2017 0849   MCHC 32.1 02/08/2017 0849   RDW 13.3 02/08/2017 0849   RDW 13.1 01/07/2017 1308   LYMPHSABS 0.5 (L) 02/04/2017 2041   LYMPHSABS 1.1 01/07/2017 1308   MONOABS 0.7 02/04/2017 2041   MONOABS 0.4 01/07/2017 1308   EOSABS 0.0 02/04/2017 2041   EOSABS 0.1 01/07/2017 1308   BASOSABS 0.0 02/04/2017 2041   BASOSABS 0.0 01/07/2017 1308   HEPATIC Function Panel Recent Labs    07/02/16 1511 07/06/16 1506 01/07/17 1308  PROT 7.0 7.0 7.4   HEMOGLOBIN A1C No components found for: HGA1C,  MPG CARDIAC ENZYMES No results found for: CKTOTAL, CKMB, CKMBINDEX, TROPONINI BNP No results for input(s): PROBNP in the last 8760 hours. TSH No results for input(s): TSH in the last 8760 hours. CHOLESTEROL No results for input(s): CHOL in the last  8760 hours.  Scheduled Meds: . amLODipine  5 mg Oral Daily  . ferrous sulfate  325 mg Oral TID WC  . gabapentin  300 mg Oral TID  . heparin  5,000 Units Subcutaneous Q8H  . metoprolol tartrate  25 mg Oral BID  . PARoxetine  20 mg Oral BID   Continuous Infusions: . cefTRIAXone (ROCEPHIN)  IV 1 g (02/08/17 1631)   PRN Meds:.acetaminophen, albuterol, ALPRAZolam, ondansetron, oxyCODONE-acetaminophen  Assessment/Plan: Right and Left lung pneumonia S/P breast cancer Hypertension Obesity COPD Acute renal failure-improved Hypokalemia-improved  Increase activity Home in AM if stable.    LOS: 5 days    [name]  MD  02/09/2017, 9:23 AM

## 2017-02-10 LAB — CULTURE, BLOOD (ROUTINE X 2)
Culture: NO GROWTH
Culture: NO GROWTH
Special Requests: ADEQUATE
Special Requests: ADEQUATE

## 2017-02-10 MED ORDER — AMLODIPINE BESYLATE 5 MG PO TABS: 5.0000 mg | ORAL_TABLET | Freq: Every day | ORAL | 3 refills | Status: DC

## 2017-02-10 MED ORDER — LEVOFLOXACIN 500 MG PO TABS: 500.0000 mg | ORAL_TABLET | Freq: Every day | ORAL | 0 refills | Status: DC

## 2017-02-10 MED ORDER — LEVOFLOXACIN 500 MG PO TABS
500.0000 mg | ORAL_TABLET | Freq: Every day | ORAL | Status: DC
  Administered 2017-02-10: 500 mg via ORAL
  Filled 2017-02-10: qty 1

## 2017-02-10 MED ORDER — FERROUS SULFATE 325 (65 FE) MG PO TABS: 325.0000 mg | ORAL_TABLET | Freq: Every day | ORAL | 3 refills | Status: DC

## 2017-02-10 NOTE — Discharge Summary (Signed)
Physician Discharge Summary  Patient ID: Deborah Mcintyre MRN: [id number] DOB/AGE: 62-Dec-1957 62 y.o.  Admit date: 02/04/2017 Discharge date: 02/10/2017  Admission Diagnoses: Left lung pneumonia Possible right lung pneumonia Status post left breast cancer Hypertension Obesity COPD Acute renal failure  Discharge Diagnoses:  Principal problem: Right and left lung pneumonia, community-acquired Active Problems: Status post left breast cancer Hypertension Obesity COPD Acute renal failure - improved Hypokalemia - improved Anemia of chronic disease Chronic thrombocytopenia  Discharged Condition: fair  Hospital Course: 62 year old female with a past medical history of hypertension, left breast cancer with mastectomy, COPD and arthritis had a left-sided chest pain with fever and cough chest x-ray showed a left lung pneumonia and early right lung pneumonia.  She was treated with IV antibiotic and gradually improved over 1 week.  Her acute renal failure responded to IV fluids.   Consults: cardiology  Significant Diagnostic Studies: labs: WBC counts were elevated at 13,800.  Platelets counts were low at 49-96,000.  Potassium level was low at 3.2 and normalized with potassium supplement.  Creatinine was elevated at 2.61 and normalized with IV fluids.  EKG showed sinus tachycardia with right bundle branch block.  Chest x-ray showed left lower lobe pneumonia and possible early right upper lobe pneumonia  Treatments: antibiotics: IV ceftriaxone followed by PO Levaquin.  Discharge Exam: Blood pressure 124/76, pulse 73, temperature 98.5 F (36.9 C), temperature source Oral, resp. rate 16, height 5' 7.5" (1.715 m), weight 95.2 kg (209 lb 14.1 oz), SpO2 96 %. General appearance: alert, cooperative and appears stated age. Head: Normocephalic, atraumatic. Eyes: Brown/Blue/Hazel eyes, pink conjunctiva, corneas clear. PERRL, EOM's intact.  Neck: No adenopathy, no carotid bruit, no JVD, supple,  symmetrical, trachea midline and thyroid not enlarged. Resp: Clear to auscultation bilaterally. Cardio: Regular rate and rhythm, S1, S2 normal, II/VI systolic murmur, no click, rub or gallop. GI: Soft, non-tender; bowel sounds normal; no organomegaly. Extremities: No edema, cyanosis or clubbing. Skin: Warm and dry.  Neurologic: Alert and oriented X 3, normal strength and tone. Normal coordination and gait.  Disposition: 01-Home or Self Care   Allergies as of 02/10/2017   No Known Allergies     Medication List    STOP taking these medications   ibuprofen 200 MG tablet Commonly known as:  ADVIL,MOTRIN     TAKE these medications   albuterol (2.5 MG/3ML) 0.083% nebulizer solution Commonly known as:  PROVENTIL Take 2.5 mg by nebulization every 6 (six) hours as needed for wheezing or shortness of breath.   albuterol 108 (90 Base) MCG/ACT inhaler Commonly known as:  PROVENTIL HFA;VENTOLIN HFA Inhale 1-2 puffs into the lungs every 6 (six) hours as needed for wheezing or shortness of breath.   ALPRAZolam 0.5 MG tablet Commonly known as:  XANAX Take 1 tablet (0.5 mg total) by mouth 3 (three) times daily as needed for anxiety or sleep.   amLODipine 5 MG tablet Commonly known as:  NORVASC Take 1 tablet (5 mg total) by mouth daily.   conjugated estrogens vaginal cream Commonly known as:  PREMARIN Place 1 Applicatorful vaginally daily.   cycloSPORINE 0.05 % ophthalmic emulsion Commonly known as:  RESTASIS Place 2 drops into both eyes 2 (two) times daily as needed (dryness).   diphenoxylate-atropine 2.5-0.025 MG tablet Commonly known as:  LOMOTIL Take 1 tablet by mouth 4 (four) times daily as needed for diarrhea or loose stools.   ferrous sulfate 325 (65 FE) MG tablet Take 1 tablet (325 mg total) by mouth daily with breakfast.  gabapentin 300 MG capsule Commonly known as:  NEURONTIN Take 1 capsule (300 mg total) by mouth 3 (three) times daily.   levofloxacin 500 MG  tablet Commonly known as:  LEVAQUIN Take 1 tablet (500 mg total) by mouth daily.   magic mouthwash w/lidocaine Soln Take 5 mLs by mouth 2 (two) times daily as needed for mouth pain.   metoprolol tartrate 50 MG tablet Commonly known as:  LOPRESSOR Take 0.5 tablets (25 mg total) by mouth 2 (two) times daily.   ondansetron 8 MG tablet Commonly known as:  ZOFRAN Take 1 tablet (8 mg total) by mouth every 8 (eight) hours as needed for nausea.   oxyCODONE-acetaminophen 5-325 MG tablet Commonly known as:  PERCOCET/ROXICET Take 1 tablet by mouth every 4 (four) hours as needed for moderate pain or severe pain (1 tab every 4-6 hour prn pain).   PARoxetine 20 MG tablet Commonly known as:  PAXIL Take 20 mg by mouth 2 (two) times daily.      Follow-up Information    [name], MD. Schedule an appointment as soon as possible for a visit in 1 week(s).   Specialty:  Cardiology Contact information: [place] [address] [phone]           Signed: Birdie Riddle 02/10/2017, 8:34 AM

## 2017-03-01 MED FILL — PREMARIN VAGINAL CREAM-APPL: 0.625 | 15 days supply | Qty: 30 | Fill #6

## 2017-03-04 ENCOUNTER — Other Ambulatory Visit: Payer: Self-pay

## 2017-03-04 MED ORDER — OXYCODONE-ACETAMINOPHEN 5-325 MG PO TABS: 1.0000 | ORAL_TABLET | ORAL | 0 refills | Status: DC | PRN

## 2017-03-04 NOTE — Telephone Encounter (Signed)
Called pt and informed script is available for pickup this afternoon prior to 430p.  Cyndia Bent RN

## 2017-04-19 ENCOUNTER — Other Ambulatory Visit: Payer: Self-pay | Admitting: Hematology and Oncology

## 2017-04-20 ENCOUNTER — Other Ambulatory Visit: Payer: Self-pay

## 2017-04-20 MED ORDER — OXYCODONE-ACETAMINOPHEN 5-325 MG PO TABS: 1.0000 | ORAL_TABLET | ORAL | 0 refills | Status: DC | PRN

## 2017-04-20 NOTE — Telephone Encounter (Signed)
Called and informed pt script would be available for pick up prior to 4:30 this afternoon.  Cyndia Bent RN

## 2017-06-10 ENCOUNTER — Other Ambulatory Visit: Payer: Self-pay

## 2017-06-10 ENCOUNTER — Telehealth: Payer: Self-pay | Admitting: *Deleted

## 2017-06-10 MED ORDER — OXYCODONE-ACETAMINOPHEN 5-325 MG PO TABS: 1.0000 | ORAL_TABLET | ORAL | 0 refills | Status: DC | PRN

## 2017-06-10 NOTE — Telephone Encounter (Signed)
"  I need to leave a message for Dr. Geralyn Flash nurse." Transferred as requested.  No Triage needs at this time.

## 2017-06-11 ENCOUNTER — Telehealth: Payer: Self-pay

## 2017-06-11 NOTE — Telephone Encounter (Signed)
LVM Informed pt her script was available for pick up.

## 2017-06-30 ENCOUNTER — Inpatient Hospital Stay: Payer: Medicare Other | Attending: Hematology and Oncology | Admitting: Hematology and Oncology

## 2017-06-30 NOTE — Assessment & Plan Note (Deleted)
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  Treatment summary: 1. Neoadjuvant chemotherapy with dose dense Adriamycin and Cytoxan 4 followed by Taxol and carboplatin 12 weekly from 01/18/2015 to 06/21/2015 2. left Mastectomy With axillary lymph node dissection 08/23/2015: Invasive ductal carcinoma with calcifications grade 3, 1.3 cm, image, margins negative, 1/6 lymph nodes positive with extracapsular extension, ER 0%, 0%, T1cN1a stage IIA 3. Adjuvant XRT 10/31/15 - 01/08/2016  Plan: Adj Capecitabine for 6 months Started 02/05/2016 .   Breast cancer surveillance: 1.  Breast exam: Benign 2. mammogram annually  Return to clinic in 1 year for follow-up

## 2017-07-21 ENCOUNTER — Other Ambulatory Visit: Payer: Self-pay | Admitting: *Deleted

## 2017-07-21 MED ORDER — OXYCODONE-ACETAMINOPHEN 5-325 MG PO TABS: 1.0000 | ORAL_TABLET | ORAL | 0 refills | Status: DC | PRN

## 2017-09-15 ENCOUNTER — Telehealth: Payer: Self-pay

## 2017-09-15 NOTE — Telephone Encounter (Signed)
Left a detailed msg concerning patient request for a new appointment to be scheduled. Mailed a letter with a calender enclosed. Per 9/11 los

## 2017-09-16 ENCOUNTER — Telehealth: Payer: Self-pay | Admitting: General Practice

## 2017-09-16 NOTE — Telephone Encounter (Signed)
[place] CSW Progress Notes  FMLA paperwork received by fax from son, Cohen Doleman.  Given to Rolly Salter for processing.  Edwyna Shell, LCSW Clinical Social Worker Phone:  [phone]

## 2017-09-20 ENCOUNTER — Telehealth: Payer: Self-pay

## 2017-09-20 NOTE — Progress Notes (Signed)
FMLA for son, Adelee Hannula, mailed to patient address on file. No fax number provided to fax to.

## 2017-09-20 NOTE — Telephone Encounter (Signed)
Attempted to return call to pt.  Name identified on the vm for number provided was not the patient.  No msg left.

## 2017-09-23 ENCOUNTER — Inpatient Hospital Stay: Payer: Medicare Other | Attending: Hematology and Oncology | Admitting: Hematology and Oncology

## 2017-09-23 ENCOUNTER — Telehealth: Payer: Self-pay | Admitting: Hematology and Oncology

## 2017-09-23 DIAGNOSIS — Z9221 Personal history of antineoplastic chemotherapy: Secondary | ICD-10-CM | POA: Diagnosis not present

## 2017-09-23 DIAGNOSIS — Z9012 Acquired absence of left breast and nipple: Secondary | ICD-10-CM

## 2017-09-23 DIAGNOSIS — C50412 Malignant neoplasm of upper-outer quadrant of left female breast: Secondary | ICD-10-CM | POA: Insufficient documentation

## 2017-09-23 DIAGNOSIS — Z923 Personal history of irradiation: Secondary | ICD-10-CM

## 2017-09-23 DIAGNOSIS — Z8701 Personal history of pneumonia (recurrent): Secondary | ICD-10-CM | POA: Diagnosis not present

## 2017-09-23 DIAGNOSIS — G893 Neoplasm related pain (acute) (chronic): Secondary | ICD-10-CM

## 2017-09-23 DIAGNOSIS — Z79899 Other long term (current) drug therapy: Secondary | ICD-10-CM | POA: Diagnosis not present

## 2017-09-23 DIAGNOSIS — Z171 Estrogen receptor negative status [ER-]: Secondary | ICD-10-CM | POA: Diagnosis not present

## 2017-09-23 MED ORDER — ONDANSETRON HCL 8 MG PO TABS: 8.0000 mg | ORAL_TABLET | Freq: Three times a day (TID) | ORAL | 3 refills | Status: AC | PRN

## 2017-09-23 MED ORDER — OXYCODONE-ACETAMINOPHEN 5-325 MG PO TABS: 1.0000 | ORAL_TABLET | Freq: Three times a day (TID) | ORAL | 0 refills | Status: DC | PRN

## 2017-09-23 MED ORDER — GABAPENTIN 300 MG PO CAPS: 300.0000 mg | ORAL_CAPSULE | Freq: Three times a day (TID) | ORAL | 6 refills | Status: DC

## 2017-09-23 NOTE — Telephone Encounter (Signed)
Gave avs and calendar ° °

## 2017-09-23 NOTE — Assessment & Plan Note (Signed)
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  Treatment summary: 1. Neoadjuvant chemotherapy with dose dense Adriamycin and Cytoxan 4 followed by Taxol and carboplatin 12 weekly from 01/18/2015 to 06/21/2015 2. left Mastectomy With axillary lymph node dissection 08/23/2015: Invasive ductal carcinoma with calcifications grade 3, 1.3 cm, image, margins negative, 1/6 lymph nodes positive with extracapsular extension, ER 0%, 0%, T1cN1a stage IIA 3. Plan: Adjuvant XRT 10/31/15 - 01/08/2016  Plan: Adj Capecitabine for 6 months Started 02/05/2016 .   Xeloda toxicities: 1. Occasional loose stools 2.mild nausea No evidence of hand-foot syndrome.

## 2017-09-23 NOTE — Progress Notes (Signed)
Patient Care Team: [name], MD as PCP - General (Internal Medicine) Nicholas Lose, MD as Consulting Physician (Hematology and Oncology) Alphonsa Overall, MD as Consulting Physician (General Surgery) Delice Bison, Charlestine Massed, NP as Nurse Practitioner (Hematology and Oncology) Kyung Rudd, MD as Consulting Physician (Radiation Oncology)  DIAGNOSIS:  Encounter Diagnosis  Name Primary?  . Malignant neoplasm of upper-outer quadrant of left breast in female, estrogen receptor negative ([place])     SUMMARY OF ONCOLOGIC HISTORY:   Breast cancer of upper-outer quadrant of left female breast ([place])   12/19/2014 Mammogram    Left breast palpable mass at 2:00: 4 x 3.8 x 2.5 cm, enlarged left axillary lymph node 4 cm in size with diffuse skin thickening [place] 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    12/26/2014 Initial Diagnosis    Left breast biopsy: Invasive ductal carcinoma with lymphovascular invasion, 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, grade 3, ER/PR negative, Ki-67 80% HER-2 Neg    01/18/2015 - 06/21/2015 Neo-Adjuvant Chemotherapy    Dose dense Adriamycin and Cytoxan 4 followed by Taxol and carboplatin [id number]    07/03/2015 Breast MRI    Improvement in multiple areas of enhancement largest area 3.4 cm other suspicious nodules anterior to the mass also improved, left axillary internal mammary lymph nodes improved    08/23/2015 Surgery    Left mastectomy with axillary lymph node dissection: Invasive ductal carcinoma with calcifications grade 3, 1.3 cm, image, margins negative, 1/6 lymph nodes positive with extracapsular extension, ER 0%, 0%, T1cN1a stage II a     10/31/2015 - 01/08/2016 Radiation Therapy    Adj XRT Lisbeth Renshaw): Left Chest Wall and Left SCLV treated to 50.4 Gy in 28 fractions of 1.8 Gy. Left Chest Wall was then boosted to 60.4 Gy in 5 fractions of 2 Gy.    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  She is here for annual follow-up.  She complains of severe pain in the left chest wall for which she takes gabapentin as well as narcotic pain medication this pain is unrelenting for her.  Denies any lumps or nodules in the breast.   REVIEW OF SYSTEMS:   Constitutional: Denies fevers, chills or abnormal weight loss Eyes: Denies blurriness of vision Ears, nose, mouth, throat, and face: Denies mucositis or sore throat Respiratory: Denies cough, dyspnea or wheezes Cardiovascular: Denies palpitation, chest discomfort Gastrointestinal:  Denies nausea, heartburn or change in bowel habits Skin: Denies abnormal skin rashes Lymphatics: Denies new lymphadenopathy or easy bruising Neurological: Neuropathy in hands Behavioral/Psych: Mood is stable, no new changes  Extremities: No lower extremity edema Breast: Severe pain in the left chest wall All other systems were reviewed with the patient and are negative.  I have reviewed the past medical history, past surgical history, social history and family history with the patient and they are unchanged from previous note.  ALLERGIES:  has No Known Allergies.  MEDICATIONS:  Current Outpatient Medications  Medication Sig Dispense Refill  . albuterol (PROVENTIL HFA;VENTOLIN HFA) 108 (90 Base) MCG/ACT inhaler Inhale 1-2 puffs into the lungs every 6 (  six) hours as needed for wheezing or shortness of breath.    [name] albuterol (PROVENTIL) (2.5 MG/3ML) 0.083% nebulizer solution Take 2.5 mg by nebulization every 6 (six) hours as needed for wheezing or shortness of breath.    . ALPRAZolam (XANAX) 0.5 MG tablet  Take 1 tablet (0.5 mg total) by mouth 3 (three) times daily as needed for anxiety or sleep. 60 tablet 0  . ferrous sulfate 325 (65 FE) MG tablet Take 1 tablet (325 mg total) by mouth daily with breakfast. 30 tablet 3  . gabapentin (NEURONTIN) 300 MG capsule Take 1 capsule (300 mg total) by mouth 3 (three) times daily. 90 capsule 6  . metoprolol (LOPRESSOR) 50 MG tablet Take 0.5 tablets (25 mg total) by mouth 2 (two) times daily.    . ondansetron (ZOFRAN) 8 MG tablet Take 1 tablet (8 mg total) by mouth every 8 (eight) hours as needed for nausea. 30 tablet 3  . oxyCODONE-acetaminophen (PERCOCET/ROXICET) 5-325 MG tablet Take 1 tablet by mouth every 8 (eight) hours as needed for moderate pain or severe pain (1 tab every 4-6 hour prn pain). 60 tablet 0  . PARoxetine (PAXIL) 20 MG tablet Take 20 mg by mouth 2 (two) times daily.     [name] PREMARIN vaginal cream INSERT 1 APPLICATORFUL VAGINALLY DAILY 30 g 12   No current facility-administered medications for this visit.    Facility-Administered Medications Ordered in Other Visits  Medication Dose Route Frequency Provider Last Rate Last Dose  . sodium chloride 0.9 % injection 10 mL  10 mL Intravenous PRN Nicholas Lose, MD   10 mL at 05/25/16 1440    PHYSICAL EXAMINATION: ECOG PERFORMANCE STATUS: 1 - Symptomatic but completely ambulatory  Vitals:   09/23/17 1455  BP: (!) 152/99  Pulse: (!) 101  Resp: 18  Temp: 98.3 F (36.8 C)  SpO2: 99%   Filed Weights   09/23/17 1455  Weight: 200 lb 4.8 oz (90.9 kg)    GENERAL:alert, no distress and comfortable SKIN: skin color, texture, turgor are normal, no rashes or significant lesions EYES: normal, Conjunctiva are pink and non-injected, sclera clear OROPHARYNX:no exudate, no erythema and lips, buccal mucosa, and tongue normal  NECK: supple, thyroid normal size, non-tender, without nodularity LYMPH:  no palpable lymphadenopathy in the cervical, axillary or inguinal LUNGS: clear to auscultation and  percussion with normal breathing effort HEART: regular rate & rhythm and no murmurs and no lower extremity edema ABDOMEN:abdomen soft, non-tender and normal bowel sounds MUSCULOSKELETAL:no cyanosis of digits and no clubbing  NEURO: alert & oriented x 3 with fluent speech, no focal motor/sensory deficits EXTREMITIES: No lower extremity edema BREAST: No palpable lumps or nodules in the left chest wall.  No lumps or nodules in the right breast (exam performed in the presence of a chaperone)  LABORATORY DATA:  I have reviewed the data as listed CMP Latest Ref Rng & Units 02/07/2017 02/05/2017 02/05/2017  Glucose 65 - 99 mg/dL 88 119(H) -  BUN 6 - 20 mg/dL 14 26(H) -  Creatinine 0.44 - 1.00 mg/dL 0.85 1.92(H) 2.08(H)  Sodium 135 - 145 mmol/L 143 136 -  Potassium 3.5 - 5.1 mmol/L 4.8 3.1(L) -  Chloride 101 - 111 mmol/L 110 104 -  CO2 22 - 32 mmol/L 26 20(L) -  Calcium 8.9 - 10.3 mg/dL 8.6(L) 7.7(L) -  Total Protein 6.4 - 8.3 g/dL - - -  Total Bilirubin 0.20 - 1.20 mg/dL - - -  Alkaline Phos 40 - 150 U/L - - -  AST 5 - 34 U/L - - -  ALT 0 - 55 U/L - - -    Lab Results  Component Value Date   WBC 4.9 02/08/2017   HGB 11.7 (L) 02/08/2017   HCT 36.4 02/08/2017   MCV 100.0 02/08/2017   PLT 69 (L) 02/08/2017   NEUTROABS 12.5 (H) 02/04/2017    ASSESSMENT & PLAN:  Breast cancer of upper-outer quadrant of left female breast ([place]) Left breast biopsy12/21/16: inflammatory breast cancerInvasive ductal carcinoma with lymphovascular invasion, 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  Treatment summary: 1. Neoadjuvant chemotherapy with dose dense Adriamycin and Cytoxan 4 followed by Taxol and carboplatin 12 weekly from 01/18/2015 to 06/21/2015 2. left Mastectomy With axillary lymph node  dissection 08/23/2015: Invasive ductal carcinoma with calcifications grade 3, 1.3 cm, image, margins negative, 1/6 lymph nodes positive with extracapsular extension, ER 0%, 0%, T1cN1a stage IIA 3. Plan: Adjuvant XRT 10/31/15 - 01/08/2016   I will refer the patient to see Dr. Maryjean Ka with interventional pain medicine regarding her postmastectomy pain. I renewed her prescription for pain medication.  Return to clinic in 1 year for follow-up   No orders of the defined types were placed in this encounter.  The patient has a good understanding of the overall plan. she agrees with it. she will call with any problems that may develop before the next visit here.   Harriette Ohara, MD 09/23/17

## 2017-11-01 ENCOUNTER — Other Ambulatory Visit: Payer: Self-pay

## 2017-11-01 ENCOUNTER — Telehealth: Payer: Self-pay

## 2017-11-01 MED ORDER — OXYCODONE-ACETAMINOPHEN 5-325 MG PO TABS: 1.0000 | ORAL_TABLET | Freq: Three times a day (TID) | ORAL | 0 refills | Status: DC | PRN

## 2017-11-01 NOTE — Telephone Encounter (Signed)
RX approved/sign by Dr. Lindi Adie.  Left voicemail for patient informing her that RX was ready for pick up.  RX placed in script book in triage.

## 2017-12-17 ENCOUNTER — Other Ambulatory Visit: Payer: Self-pay

## 2017-12-17 MED ORDER — OXYCODONE-ACETAMINOPHEN 5-325 MG PO TABS: 1.0000 | ORAL_TABLET | Freq: Three times a day (TID) | ORAL | 0 refills | Status: DC | PRN

## 2017-12-17 NOTE — Telephone Encounter (Signed)
Pt called requesting refill on her pain medication. Told pt that Dr.Gudena is out of the office until next week. She will call back and call on Monday.

## 2018-02-04 ENCOUNTER — Encounter (HOSPITAL_COMMUNITY): Payer: Self-pay | Admitting: Emergency Medicine

## 2018-02-04 ENCOUNTER — Ambulatory Visit (HOSPITAL_COMMUNITY)
Admission: EM | Admit: 2018-02-04 | Discharge: 2018-02-04 | Disposition: A | Discharge status: Home / Self Care | Payer: Medicare Other | Attending: Family Medicine | Admitting: Family Medicine

## 2018-02-04 ENCOUNTER — Other Ambulatory Visit: Payer: Self-pay

## 2018-02-04 DIAGNOSIS — T7840XA Allergy, unspecified, initial encounter: Secondary | ICD-10-CM | POA: Diagnosis not present

## 2018-02-04 MED ORDER — METHYLPREDNISOLONE SODIUM SUCC 125 MG IJ SOLR
INTRAMUSCULAR | Status: AC
  Filled 2018-02-04: qty 2

## 2018-02-04 MED ORDER — METHYLPREDNISOLONE SODIUM SUCC 125 MG IJ SOLR
80.0000 mg | Freq: Once | INTRAMUSCULAR | Status: AC
  Administered 2018-02-04: 80 mg via INTRAMUSCULAR

## 2018-02-04 MED ORDER — AMOXICILLIN-POT CLAVULANATE 875-125 MG PO TABS: 1.0000 | ORAL_TABLET | Freq: Two times a day (BID) | ORAL | 0 refills | Status: DC

## 2018-02-04 MED ORDER — PREDNISONE 10 MG (21) PO TBPK: ORAL_TABLET | ORAL | 0 refills | Status: DC

## 2018-02-04 NOTE — Discharge Instructions (Addendum)
I am treating your for an allergic reaction and infection Augmentin 2 x day for 7 days.  Steroid injection given here in the clinic  You can start the other steroid tomorrow Follow up as needed for continued or worsening symptoms

## 2018-02-04 NOTE — ED Triage Notes (Signed)
PT had her eyebrows threaded and tinted Tuesday. Thursday, she developed swelling around eyes and redness/ open sore to sire of eyebrow work.

## 2018-02-04 NOTE — ED Provider Notes (Signed)
[place]    CSN: [id number] Arrival date & time: 02/04/18  1509     History   Chief Complaint Chief Complaint  Patient presents with  . Allergic Reaction    HPI Deborah Mcintyre is a 63 y.o. female.   Patient is a 63 year old female that presents today with facial swelling, drainage.  She reports that she had her eyebrows threaded and tented on Tuesday.  Thursday she started developing swelling around the eye with redness. Reports this am her eyes were swollen shut.   She is also been having swelling and drainage from both eyebrows.  The drainage has been green and purulent.  Her symptoms have been constant and worsening.  She has not taken anything for her symptoms.  She denies any associated fever, chills, headache or vision problems.  ROS per HPI      Past Medical History:  Diagnosis Date  . Anxiety   . Arthritis   . Bronchitis   . COPD (chronic obstructive pulmonary disease) ([place])   . Depression   . Hypertension   . Malignant neoplasm of upper-outer quadrant of left female breast ([place]) 01/04/2015  . Nocturia   . Numbness and tingling    toes - bilateral  . Pneumonia   . PONV (postoperative nausea and vomiting)    Nausea    Patient Active Problem List   Diagnosis Date Noted  . Community acquired pneumonia of left lung 02/04/2017  . Antineoplastic chemotherapy induced pancytopenia ([place]) 04/26/2015  . Antineoplastic chemotherapy induced anemia 04/05/2015  . Breast cancer of upper-outer quadrant of left female breast ([place]) 01/01/2015  . Acute exacerbation of chronic bronchitis ([place]) 08/03/2013    Past Surgical History:  Procedure Laterality Date  . BREAST LUMPECTOMY Bilateral    x3  . BREAST LUMPECTOMY WITH RADIOACTIVE SEED LOCALIZATION Right 08/23/2015   Procedure: RIGHT BREAST LUMPECTOMY WITH RADIOACTIVE SEED LOCALIZATION;  Surgeon: Alphonsa Overall, MD;  Location: [place] [facility];  Service: General;  Laterality: Right;  . CESAREAN SECTION     x4  . IR  REMOVAL TUN ACCESS W/ PORT W/O FL MOD SED  10/20/2016  . MASTECTOMY Left 08/23/2015    RIGHT BREAST LUMPECTOMY WITH RADIOACTIVE SEED LOCALIZATION,  LEFT MASTECTOMY WITH AXILLARY LYMPH NODE DISSECTION  . MASTECTOMY WITH AXILLARY LYMPH NODE DISSECTION Left 08/23/2015   Procedure: LEFT MASTECTOMY WITH AXILLARY LYMPH NODE DISSECTION;  Surgeon: Alphonsa Overall, MD;  Location: [facility];  Service: General;  Laterality: Left;  [name] MULTIPLE TOOTH EXTRACTIONS      OB History   No obstetric history on file.      Home Medications    Prior to Admission medications   Medication Sig Start Date End Date Taking? Authorizing Provider  ferrous sulfate 325 (65 FE) MG tablet Take 1 tablet (325 mg total) by mouth daily with breakfast. 02/10/17  Yes [name], MD  gabapentin (NEURONTIN) 300 MG capsule Take 1 capsule (300 mg total) by mouth 3 (three) times daily. 09/23/17  Yes Nicholas Lose, MD  metoprolol (LOPRESSOR) 50 MG tablet Take 0.5 tablets (25 mg total) by mouth 2 (two) times daily. 08/07/13  Yes [name], MD  ondansetron (ZOFRAN) 8 MG tablet Take 1 tablet (8 mg total) by mouth every 8 (eight) hours as needed for nausea. 09/23/17  Yes Nicholas Lose, MD  oxyCODONE-acetaminophen (PERCOCET/ROXICET) 5-325 MG tablet Take 1 tablet by mouth every 8 (eight) hours as needed for moderate pain or severe pain (1 tab every 4-6 hour prn pain). 12/17/17  Yes  Nicholas Lose, MD  PARoxetine (PAXIL) 20 MG tablet Take 20 mg by mouth 2 (two) times daily.    Yes [provider]  albuterol (PROVENTIL HFA;VENTOLIN HFA) 108 (90 Base) MCG/ACT inhaler Inhale 1-2 puffs into the lungs every 6 (six) hours as needed for wheezing or shortness of breath.    [provider]  albuterol (PROVENTIL) (2.5 MG/3ML) 0.083% nebulizer solution Take 2.5 mg by nebulization every 6 (six) hours as needed for wheezing or shortness of breath.    [provider]  ALPRAZolam Duanne Moron) 0.5 MG tablet Take 1 tablet (0.5 mg total) by mouth 3  (three) times daily as needed for anxiety or sleep. 08/13/15   Nicholas Lose, MD  amoxicillin-clavulanate (AUGMENTIN) 875-125 MG tablet Take 1 tablet by mouth every 12 (twelve) hours. 02/04/18   Loura Halt A, NP  predniSONE (STERAPRED UNI-PAK 21 TAB) 10 MG (21) TBPK tablet 6 tabs for 1 day, then 5 tabs for 1 das, then 4 tabs for 1 day, then 3 tabs for 1 day, 2 tabs for 1 day, then 1 tab for 1 day 02/04/18   Loura Halt A, NP  PREMARIN vaginal cream INSERT 1 APPLICATORFUL VAGINALLY DAILY 04/19/17   Nicholas Lose, MD    Family History No family history on file.  Social History Social History   Tobacco Use  . Smoking status: Current Every Day Smoker    Packs/day: 0.75    Years: 45.00    Pack years: 33.75    Types: Cigarettes  . Smokeless tobacco: Never Used  Substance Use Topics  . Alcohol use: Yes    Comment: ocassionally  . Drug use: No     Allergies   Patient has no known allergies.   Review of Systems Review of Systems   Physical Exam Triage Vital Signs ED Triage Vitals  Enc Vitals Group     BP 02/04/18 1718 (!) 152/98     Pulse Rate 02/04/18 1718 87     Resp 02/04/18 1718 16     Temp 02/04/18 1718 99.1 F (37.3 C)     Temp Source 02/04/18 1718 Oral     SpO2 02/04/18 1718 97 %     Weight --      Height --      Head Circumference --      Peak Flow --      Pain Score 02/04/18 1717 4     Pain Loc --      Pain Edu? --      Excl. in [place]? --    No data found.  Updated Vital Signs BP (!) 152/98   Pulse 87   Temp 99.1 F (37.3 C) (Oral)   Resp 16   SpO2 97%   Visual Acuity Right Eye Distance:   Left Eye Distance:   Bilateral Distance:    Right Eye Near:   Left Eye Near:    Bilateral Near:     Physical Exam Vitals signs and nursing note reviewed.  Constitutional:      General: She is not in acute distress.    Appearance: She is well-developed.  HENT:     Head: Normocephalic.     Comments: Swelling, erythema and green drainage noted from bilateral  eyebrows.   Eyes:     Conjunctiva/sclera: Conjunctivae normal.     Comments: Significant periorbital edema and erythema.   Neck:     Musculoskeletal: Neck supple.  Cardiovascular:     Rate and Rhythm: Normal rate and regular rhythm.  Heart sounds: No murmur.  Pulmonary:     Effort: Pulmonary effort is normal. No respiratory distress.     Breath sounds: Normal breath sounds.  Abdominal:     Palpations: Abdomen is soft.     Tenderness: There is no abdominal tenderness.  Musculoskeletal: Normal range of motion.  Skin:    General: Skin is warm and dry.  Neurological:     Mental Status: She is alert.  Psychiatric:        Mood and Affect: Mood normal.      UC Treatments / Results  Labs (all labs ordered are listed, but only abnormal results are displayed) Labs Reviewed - No data to display  EKG None  Radiology No results found.  Procedures Procedures (including critical care time)  Medications Ordered in UC Medications  methylPREDNISolone sodium succinate (SOLU-MEDROL) 125 mg/2 mL injection 80 mg (80 mg Intramuscular Given 02/04/18 1819)    Initial Impression / Assessment and Plan / UC Course  I have reviewed the triage vital signs and the nursing notes.  Pertinent labs & imaging results that were available during my care of the patient were reviewed by me and considered in my medical decision making (see chart for details).     We will go ahead and treat patient for allergic reaction based on the amount of facial swelling.  I am also prescribed antibiotics for possible infection due to purulent drainage from eyebrows. Solu-Medrol 80 mg given here in clinic I am giving a Dosepak for her to start in the morning and Augmentin for her to start tonight Strict precautions and instructions that if her symptoms worsen despite treatment she will need to the hospital Patient understanding and agreeable plan Final Clinical Impressions(s) / UC Diagnoses   Final diagnoses:   Allergic reaction, initial encounter     Discharge Instructions     I am treating your for an allergic reaction and infection Augmentin 2 x day for 7 days.  Steroid injection given here in the clinic  You can start the other steroid tomorrow Follow up as needed for continued or worsening symptoms     ED Prescriptions    Medication Sig Dispense Auth. Provider   amoxicillin-clavulanate (AUGMENTIN) 875-125 MG tablet Take 1 tablet by mouth every 12 (twelve) hours. 14 tablet Aprile Dickenson A, NP   predniSONE (STERAPRED UNI-PAK 21 TAB) 10 MG (21) TBPK tablet 6 tabs for 1 day, then 5 tabs for 1 das, then 4 tabs for 1 day, then 3 tabs for 1 day, 2 tabs for 1 day, then 1 tab for 1 day 21 tablet Rozanna Box, Daryl Quiros A, NP     Controlled Substance Prescriptions [place] Controlled Substance Registry consulted? Not Applicable   Orvan July, NP 02/04/18 [phone]

## 2018-02-07 ENCOUNTER — Other Ambulatory Visit: Payer: Self-pay

## 2018-02-09 ENCOUNTER — Other Ambulatory Visit: Payer: Self-pay | Admitting: Hematology and Oncology

## 2018-02-09 MED ORDER — OXYCODONE-ACETAMINOPHEN 5-325 MG PO TABS: 1.0000 | ORAL_TABLET | Freq: Three times a day (TID) | ORAL | 0 refills | Status: DC | PRN

## 2018-02-09 NOTE — Progress Notes (Signed)
MD unable to refill pain medication electronically d/t system error. Printed prescription for pt to pick up at the cancer center today. Pt aware.

## 2018-03-22 ENCOUNTER — Other Ambulatory Visit: Payer: Self-pay | Admitting: Hematology and Oncology

## 2018-03-22 ENCOUNTER — Other Ambulatory Visit: Payer: Self-pay

## 2018-03-22 ENCOUNTER — Telehealth: Payer: Self-pay

## 2018-03-22 DIAGNOSIS — Z171 Estrogen receptor negative status [ER-]: Principal | ICD-10-CM

## 2018-03-22 DIAGNOSIS — C50412 Malignant neoplasm of upper-outer quadrant of left female breast: Secondary | ICD-10-CM

## 2018-03-22 MED ORDER — OXYCODONE-ACETAMINOPHEN 5-325 MG PO TABS: 1.0000 | ORAL_TABLET | Freq: Three times a day (TID) | ORAL | 0 refills | Status: DC | PRN

## 2018-03-22 NOTE — Telephone Encounter (Signed)
Called pt to return her call. Pt requesting refill for her percocet. Pt asking about pain management referral. Will place referral for pt to Dr.Harkin's office. Pt to follow up with our office if she does not hear anything in 2 weeks. Pt verbalized understanding. Will forward percocet refill request to [facility].

## 2018-04-27 ENCOUNTER — Telehealth: Payer: Self-pay | Admitting: *Deleted

## 2018-04-27 ENCOUNTER — Other Ambulatory Visit: Payer: Self-pay | Admitting: Hematology and Oncology

## 2018-04-27 MED ORDER — OXYCODONE-ACETAMINOPHEN 5-325 MG PO TABS: 1.0000 | ORAL_TABLET | Freq: Three times a day (TID) | ORAL | 0 refills | Status: DC | PRN

## 2018-04-27 NOTE — Telephone Encounter (Signed)
Received VM from pt yesterday 04/26/2018 regarding pain medication refill and stating she has not heard back from Dr. Maryjean Ka office for chronic pain management.  I attempted to call yesterday x1 and LVM for Dr. Maryjean Ka nurse.  Today I called x2 with no answer and again LVM for Dr. Maryjean Ka nurse.  Original referral fax was sent on 03/12/2018.  I re sent referral today with note stating this was a second attempt and pt has not heard from them. Pt updated and Dr. Lindi Adie will send in script for pt percocet 5/325.

## 2018-05-11 ENCOUNTER — Inpatient Hospital Stay (HOSPITAL_COMMUNITY)
Admission: EM | Admit: 2018-05-11 | Discharge: 2018-05-15 | DRG: 597 | Disposition: A | Discharge status: Home / Self Care | Payer: Medicare Other | Attending: Internal Medicine | Admitting: Internal Medicine

## 2018-05-11 ENCOUNTER — Encounter (HOSPITAL_COMMUNITY): Payer: Self-pay | Admitting: Emergency Medicine

## 2018-05-11 ENCOUNTER — Other Ambulatory Visit: Payer: Self-pay

## 2018-05-11 ENCOUNTER — Emergency Department (HOSPITAL_COMMUNITY): Payer: Medicare Other

## 2018-05-11 DIAGNOSIS — F419 Anxiety disorder, unspecified: Secondary | ICD-10-CM | POA: Diagnosis present

## 2018-05-11 DIAGNOSIS — J90 Pleural effusion, not elsewhere classified: Secondary | ICD-10-CM

## 2018-05-11 DIAGNOSIS — Z20828 Contact with and (suspected) exposure to other viral communicable diseases: Secondary | ICD-10-CM | POA: Diagnosis present

## 2018-05-11 DIAGNOSIS — Z66 Do not resuscitate: Secondary | ICD-10-CM | POA: Diagnosis present

## 2018-05-11 DIAGNOSIS — Z171 Estrogen receptor negative status [ER-]: Secondary | ICD-10-CM

## 2018-05-11 DIAGNOSIS — Z803 Family history of malignant neoplasm of breast: Secondary | ICD-10-CM

## 2018-05-11 DIAGNOSIS — Z923 Personal history of irradiation: Secondary | ICD-10-CM

## 2018-05-11 DIAGNOSIS — J189 Pneumonia, unspecified organism: Secondary | ICD-10-CM | POA: Diagnosis present

## 2018-05-11 DIAGNOSIS — Z9221 Personal history of antineoplastic chemotherapy: Secondary | ICD-10-CM

## 2018-05-11 DIAGNOSIS — J910 Malignant pleural effusion: Secondary | ICD-10-CM | POA: Diagnosis present

## 2018-05-11 DIAGNOSIS — Z79899 Other long term (current) drug therapy: Secondary | ICD-10-CM

## 2018-05-11 DIAGNOSIS — Z23 Encounter for immunization: Secondary | ICD-10-CM

## 2018-05-11 DIAGNOSIS — I248 Other forms of acute ischemic heart disease: Secondary | ICD-10-CM | POA: Diagnosis present

## 2018-05-11 DIAGNOSIS — R0902 Hypoxemia: Secondary | ICD-10-CM

## 2018-05-11 DIAGNOSIS — R0602 Shortness of breath: Secondary | ICD-10-CM

## 2018-05-11 DIAGNOSIS — C50912 Malignant neoplasm of unspecified site of left female breast: Principal | ICD-10-CM | POA: Diagnosis present

## 2018-05-11 DIAGNOSIS — J9601 Acute respiratory failure with hypoxia: Secondary | ICD-10-CM | POA: Diagnosis present

## 2018-05-11 DIAGNOSIS — C50412 Malignant neoplasm of upper-outer quadrant of left female breast: Secondary | ICD-10-CM | POA: Diagnosis present

## 2018-05-11 DIAGNOSIS — C773 Secondary and unspecified malignant neoplasm of axilla and upper limb lymph nodes: Secondary | ICD-10-CM | POA: Diagnosis present

## 2018-05-11 DIAGNOSIS — F329 Major depressive disorder, single episode, unspecified: Secondary | ICD-10-CM

## 2018-05-11 DIAGNOSIS — J449 Chronic obstructive pulmonary disease, unspecified: Secondary | ICD-10-CM

## 2018-05-11 DIAGNOSIS — Z9012 Acquired absence of left breast and nipple: Secondary | ICD-10-CM

## 2018-05-11 DIAGNOSIS — J440 Chronic obstructive pulmonary disease with acute lower respiratory infection: Secondary | ICD-10-CM | POA: Diagnosis present

## 2018-05-11 DIAGNOSIS — F17210 Nicotine dependence, cigarettes, uncomplicated: Secondary | ICD-10-CM | POA: Diagnosis present

## 2018-05-11 DIAGNOSIS — I10 Essential (primary) hypertension: Secondary | ICD-10-CM | POA: Diagnosis present

## 2018-05-11 LAB — BASIC METABOLIC PANEL
Anion gap: 11 (ref 5–15)
BUN: 10 mg/dL (ref 8–23)
CO2: 28 mmol/L (ref 22–32)
Calcium: 9.3 mg/dL (ref 8.9–10.3)
Chloride: 104 mmol/L (ref 98–111)
Creatinine, Ser: 0.99 mg/dL (ref 0.44–1.00)
GFR calc Af Amer: >60 mL/min (ref >60)
GFR calc non Af Amer: >60 mL/min (ref >60)
Glucose, Bld: 117 mg/dL — ABNORMAL HIGH (ref 70–99)
Potassium: 3.6 mmol/L (ref 3.5–5.1)
Sodium: 143 mmol/L (ref 135–145)

## 2018-05-11 LAB — CBC
HCT: 44.6 % (ref 36.0–46.0)
Hemoglobin: 14.3 g/dL (ref 12.0–15.0)
MCH: 30.8 pg (ref 26.0–34.0)
MCHC: 32.1 g/dL (ref 30.0–36.0)
MCV: 96.1 fL (ref 80.0–100.0)
Platelets: 302 10*3/uL (ref 150–400)
RBC: 4.64 MIL/uL (ref 3.87–5.11)
RDW: 12.6 % (ref 11.5–15.5)
WBC: 6.1 10*3/uL (ref 4.0–10.5)
nRBC: 0 % (ref 0.0–0.2)

## 2018-05-11 LAB — TROPONIN I: Troponin I: 0.03 ng/mL (ref <0.03)

## 2018-05-11 MED ORDER — ALBUTEROL SULFATE HFA 108 (90 BASE) MCG/ACT IN AERS
4.0000 | INHALATION_SPRAY | Freq: Once | RESPIRATORY_TRACT | Status: AC
  Administered 2018-05-11: 23:00:00 4 via RESPIRATORY_TRACT
  Filled 2018-05-11: qty 6.7

## 2018-05-11 NOTE — ED Provider Notes (Signed)
[facility] EMERGENCY DEPARTMENT Provider Note   CSN: [id number] Arrival date & time: 05/11/18  2126    History   Chief Complaint Chief Complaint  Patient presents with  . Shortness of Breath    HPI Deborah Mcintyre is a 63 y.o. female. With a history of COPD and left breast cancer s/p mastectomy. Endorses 4 days of SOB that has been progressively getting worse. Denies fevers. Chronic cough unchanged from baseline. Has been using her home inhalers without improvement. Nothing makes her SOB better.       HPI  Past Medical History:  Diagnosis Date  . Anxiety   . Arthritis   . Bronchitis   . COPD (chronic obstructive pulmonary disease) ([place])   . Depression   . Hypertension   . Malignant neoplasm of upper-outer quadrant of left female breast ([place]) 01/04/2015  . Nocturia   . Numbness and tingling    toes - bilateral  . Pneumonia   . PONV (postoperative nausea and vomiting)    Nausea    Patient Active Problem List   Diagnosis Date Noted  . Pleural effusion on left 05/12/2018  . Hypertension 05/12/2018  . COPD (chronic obstructive pulmonary disease) ([place]) 05/12/2018  . Anxiety and depression 05/12/2018  . Community acquired pneumonia of left lung 02/04/2017  . Antineoplastic chemotherapy induced pancytopenia ([place]) 04/26/2015  . Antineoplastic chemotherapy induced anemia 04/05/2015  . Breast cancer of upper-outer quadrant of left female breast ([place]) 01/01/2015  . Acute exacerbation of chronic bronchitis ([place]) 08/03/2013    Past Surgical History:  Procedure Laterality Date  . BREAST LUMPECTOMY Bilateral    x3  . BREAST LUMPECTOMY WITH RADIOACTIVE SEED LOCALIZATION Right 08/23/2015   Procedure: RIGHT BREAST LUMPECTOMY WITH RADIOACTIVE SEED LOCALIZATION;  Surgeon: Alphonsa Overall, MD;  Location: [facility];  Service: General;  Laterality: Right;  . CESAREAN SECTION     x4  . IR REMOVAL TUN ACCESS W/ PORT W/O FL MOD SED  10/20/2016  . MASTECTOMY Left  08/23/2015    RIGHT BREAST LUMPECTOMY WITH RADIOACTIVE SEED LOCALIZATION,  LEFT MASTECTOMY WITH AXILLARY LYMPH NODE DISSECTION  . MASTECTOMY WITH AXILLARY LYMPH NODE DISSECTION Left 08/23/2015   Procedure: LEFT MASTECTOMY WITH AXILLARY LYMPH NODE DISSECTION;  Surgeon: Alphonsa Overall, MD;  Location: [facility];  Service: General;  Laterality: Left;  [name] MULTIPLE TOOTH EXTRACTIONS       OB History   No obstetric history on file.      Home Medications    Prior to Admission medications   Medication Sig Start Date End Date Taking? Authorizing Provider  albuterol (PROVENTIL HFA;VENTOLIN HFA) 108 (90 Base) MCG/ACT inhaler Inhale 1-2 puffs into the lungs every 6 (six) hours as needed for wheezing or shortness of breath.   Yes [provider]  albuterol (PROVENTIL) (2.5 MG/3ML) 0.083% nebulizer solution Take 2.5 mg by nebulization every 6 (six) hours as needed for wheezing or shortness of breath.   Yes [provider]  ALPRAZolam (XANAX) 0.5 MG tablet Take 1 tablet (0.5 mg total) by mouth 3 (three) times daily as needed for anxiety or sleep. 08/13/15  Yes Nicholas Lose, MD  ferrous sulfate 325 (65 FE) MG tablet Take 1 tablet (325 mg total) by mouth daily with breakfast. 02/10/17  Yes [name], MD  gabapentin (NEURONTIN) 300 MG capsule Take 1 capsule (300 mg total) by mouth 3 (three) times daily. 09/23/17  Yes Nicholas Lose, MD  metoprolol (LOPRESSOR) 50 MG tablet Take 0.5 tablets (25 mg total) by  mouth 2 (two) times daily. 08/07/13  Yes [name], MD  ondansetron (ZOFRAN) 8 MG tablet Take 1 tablet (8 mg total) by mouth every 8 (eight) hours as needed for nausea. 09/23/17  Yes Nicholas Lose, MD  oxyCODONE-acetaminophen (PERCOCET/ROXICET) 5-325 MG tablet Take 1 tablet by mouth every 8 (eight) hours as needed for moderate pain or severe pain (1 tab every 4-6 hour prn pain). 04/27/18  Yes Nicholas Lose, MD  PARoxetine (PAXIL) 20 MG tablet Take 20 mg by mouth 2 (two) times daily.    Yes [provider]  PREMARIN vaginal cream INSERT 1 APPLICATORFUL VAGINALLY DAILY Patient taking differently: Place 1 Applicatorful vaginally daily.  04/19/17  Yes Nicholas Lose, MD    Family History Family History  Problem Relation Age of Onset  . Breast cancer Maternal Aunt     Social History Social History   Tobacco Use  . Smoking status: Current Every Day Smoker    Packs/day: 0.75    Years: 45.00    Pack years: 33.75    Types: Cigarettes  . Smokeless tobacco: Never Used  Substance Use Topics  . Alcohol use: Yes    Comment: ocassionally  . Drug use: No     Allergies   Patient has no known allergies.   Review of Systems Review of Systems  Constitutional: Positive for fatigue. Negative for appetite change and fever.  HENT: Negative for congestion.   Respiratory: Positive for chest tightness and shortness of breath.   Cardiovascular: Positive for chest pain.  Gastrointestinal: Negative for abdominal pain, diarrhea, nausea and vomiting.  Genitourinary: Negative for dysuria.  Neurological: Negative for headaches.  All other systems reviewed and are negative.    Physical Exam Updated Vital Signs BP (!) 175/117   Pulse 86   Temp 97.8 F (36.6 C) (Oral)   Resp (!) 25   Ht 5' 7.5" (1.715 m)   Wt 85 kg   SpO2 97%   BMI 28.93 kg/m   Physical Exam Vitals signs and nursing note reviewed.  Constitutional:      General: She is not in acute distress.    Appearance: She is well-developed.  HENT:     Head: Normocephalic and atraumatic.  Eyes:     Conjunctiva/sclera: Conjunctivae normal.  Neck:     Musculoskeletal: Neck supple.  Cardiovascular:     Rate and Rhythm: Normal rate and regular rhythm.     Heart sounds: No murmur.  Pulmonary:     Effort: Pulmonary effort is normal. No respiratory distress.     Breath sounds: Examination of the right-upper field reveals decreased breath sounds. Examination of the left-upper field reveals decreased breath sounds.  Examination of the right-middle field reveals decreased breath sounds. Examination of the left-middle field reveals decreased breath sounds. Examination of the right-lower field reveals decreased breath sounds. Examination of the left-lower field reveals decreased breath sounds. Decreased breath sounds and wheezing present. No rhonchi or rales.  Chest:     Chest wall: Tenderness (left sided above her mastectomy scar) present.  Abdominal:     Palpations: Abdomen is soft.     Tenderness: There is no abdominal tenderness.  Musculoskeletal:     Right lower leg: No edema.     Left lower leg: No edema.  Skin:    General: Skin is warm and dry.  Neurological:     Mental Status: She is alert.      ED Treatments / Results  Labs (all labs ordered are listed, but only  abnormal results are displayed) Labs Reviewed  BASIC METABOLIC PANEL - Abnormal; Notable for the following components:      Result Value   Glucose, Bld 117 (*)    All other components within normal limits  TROPONIN I - Abnormal; Notable for the following components:   Troponin I 0.03 (*)    All other components within normal limits  COMPREHENSIVE METABOLIC PANEL - Abnormal; Notable for the following components:   Glucose, Bld 130 (*)    AST 13 (*)    All other components within normal limits  TROPONIN I - Abnormal; Notable for the following components:   Troponin I 0.03 (*)    All other components within normal limits  SARS CORONAVIRUS 2 (HOSPITAL ORDER, [place])  CBC  CBC  LACTATE DEHYDROGENASE  TSH  HIV ANTIBODY (ROUTINE TESTING W REFLEX)    EKG EKG Interpretation  Date/Time:  Wednesday May 11 2018 22:13:27 EDT Ventricular Rate:  99 PR Interval:    QRS Duration: 148 QT Interval:  403 QTC Calculation: 518 R Axis:   -155 Text Interpretation:  Sinus rhythm RBBB and LPFB No significant change since last tracing Confirmed by Isla Pence [phone]) on 05/11/2018 10:24:18 PM Also  confirmed by Isla Pence [phone]), editor Philomena Doheny [phone])  on 05/12/2018 7:59:45 AM   Radiology Ct Chest W Contrast  Result Date: 05/12/2018 CLINICAL DATA:  Left side chest pain, shortness of breath EXAM: CT CHEST WITH CONTRAST TECHNIQUE: Multidetector CT imaging of the chest was performed during intravenous contrast administration. CONTRAST:  43mL OMNIPAQUE IOHEXOL 300 MG/ML  SOLN COMPARISON:  Chest x-ray 05/11/2018 FINDINGS: Cardiovascular: Heart is normal size. Scattered aortic calcifications. Aorta is normal size. Mediastinum/Nodes: Enlarged mediastinal lymph nodes. Right paratracheal lymph node has a short axis diameter of 1.7 cm on image 56. Prevascular/AP window lymph nodal mass has a short axis diameter of 1.7 cm. No axillary adenopathy or right hilar adenopathy. Difficult to assess the left hilum due to airspace disease and fluid. Lungs/Pleura: Near complete opacification of the left hemithorax due to large partially loculated left effusion and airspace disease. Dense consolidation in the left upper lobe. Compressive atelectasis in the left lower lobe. No effusion on the right. Ground-glass nodule posteriorly in the right lower lobe measures 11 mm. Upper Abdomen: Imaging into the upper abdomen shows no acute findings. Musculoskeletal: Prior left mastectomy.  No acute bony abnormality. IMPRESSION: Dense consolidative airspace disease in the left upper lobe with large partially loculated left pleural effusion and compressive atelectasis in the left lower lobe. This could be related to pneumonia. A postobstructive process cannot be excluded. Mediastinal adenopathy. Prior left mastectomy. Electronically Signed   By: Rolm Baptise M.D.   On: 05/12/2018 01:25   Dg Chest Port 1 View  Result Date: 05/11/2018 CLINICAL DATA:  Shortness of breath EXAM: PORTABLE CHEST 1 VIEW COMPARISON:  02/04/2017 FINDINGS: There is near complete opacification of the left hemithorax. There is no significant shift of the  mediastinum to the right. The cardiac silhouette is suboptimally evaluated secondary to the appearance of the left hemithorax. No definite pneumothorax. The right lung field appears essentially clear. Surgical clips are noted in the left axilla. IMPRESSION: Near complete opacification of the left hemithorax which may be secondary to a large left-sided pleural effusion and/or confluent airspace disease. An underlying mass cannot be excluded. Electronically Signed   By: Constance Holster M.D.   On: 05/11/2018 21:57    Procedures Procedures (including critical care time)  Medications Ordered in ED Medications  sodium chloride flush (NS) 0.9 % injection 3 mL (3 mLs Intravenous Given 05/12/18 1124)  acetaminophen (TYLENOL) tablet 650 mg (has no administration in time range)    Or  acetaminophen (TYLENOL) suppository 650 mg (has no administration in time range)  ondansetron (ZOFRAN) tablet 4 mg (has no administration in time range)    Or  ondansetron (ZOFRAN) injection 4 mg (has no administration in time range)  albuterol (PROVENTIL) (2.5 MG/3ML) 0.083% nebulizer solution 3 mL (has no administration in time range)  PARoxetine (PAXIL) tablet 20 mg (20 mg Oral Given 05/12/18 1126)  gabapentin (NEURONTIN) capsule 300 mg (300 mg Oral Given 05/12/18 1125)  metoprolol tartrate (LOPRESSOR) tablet 25 mg (25 mg Oral Given 05/12/18 1125)  oxyCODONE-acetaminophen (PERCOCET/ROXICET) 5-325 MG per tablet 1 tablet (1 tablet Oral Given 05/12/18 0805)  pneumococcal 23 valent vaccine (PNU-IMMUNE) injection 0.5 mL (has no administration in time range)  cefTRIAXone (ROCEPHIN) 1 g in sodium chloride 0.9 % 100 mL IVPB (1 g Intravenous New Bag/Given 05/12/18 1132)  azithromycin (ZITHROMAX) 500 mg in sodium chloride 0.9 % 250 mL IVPB (has no administration in time range)  hydrALAZINE (APRESOLINE) injection 10 mg (has no administration in time range)  amLODipine (NORVASC) tablet 5 mg (5 mg Oral Given 05/12/18 1125)  lidocaine  (XYLOCAINE) 1 % (with pres) injection (has no administration in time range)  albuterol (VENTOLIN HFA) 108 (90 Base) MCG/ACT inhaler 4-8 puff (4 puffs Inhalation Given 05/11/18 2249)  iohexol (OMNIPAQUE) 300 MG/ML solution 75 mL (75 mLs Intravenous Contrast Given 05/12/18 0103)  oxyCODONE-acetaminophen (PERCOCET/ROXICET) 5-325 MG per tablet 1 tablet (1 tablet Oral Given 05/12/18 0151)     Initial Impression / Assessment and Plan / ED Course  I have reviewed the triage vital signs and the nursing notes.  Pertinent labs & imaging results that were available during my care of the patient were reviewed by me and considered in my medical decision making (see chart for details).         Deborah Mcintyre is a 63 y.o. female. With a history of COPD and left breast cancer s/p mastectomy. Endorses 4 days of SOB that has been progressively getting worse. Denies fevers. Chronic cough unchanged from baseline. Has been using her home inhalers without improvement. Nothing makes her SOB better.  On EMS arrival patient was satting 85% and was placed on NRB with improvement to 100%.   On initial exam patient is ill appearing with increased WOB. She is currently on a NRB. Patient taken off NRB and sats are 95% on RA. Vital signs otherwise stable. Physical exam as above shows increased WOB with decreased breath sounds throughout and expiratory wheezes.    ED interpretation of Labs: Normal WBC, troponin 0.03. BMP and covid pending at time of handoff ED interpretation of EKG: as above ED interpretation of Imaging: CXR shows near opacification of left lung.   Medications given in ED: albuterol MDI. Patient was given solumedrol by EMS   Pending COVID results, patient will likely need a CT chest and admission. Concern for malignant effusion given history of breast cancer.    Final Clinical Impressions(s) / ED Diagnoses   Final diagnoses:  Pleural effusion  Hypoxia  SOB (shortness of breath)    ED  Discharge Orders    None       Doneta Public, MD 05/12/18 1235    Isla Pence, MD 05/14/18 1627

## 2018-05-11 NOTE — ED Triage Notes (Signed)
Pt brought to ED by [facility] from home for increase SOB x 4 days, hx of COPD not getting better with neb treatment. SPO2 low 85% on RA on EMS arrival, 125 mg Solumedrol given by EMS pta. Temp 97.3., BP 156 84, HR 122, SPO2 99% on NRM.

## 2018-05-11 NOTE — ED Provider Notes (Signed)
  Care assumed from Dr. Doneta Public at shift change with labs pending.   In brief, this patient is a 63 y.o. F with PMH/o COPD who presents for evaluation of progressively worsening SOB x 4 days. Patient also reports some left sided chest pain x 4 days. She states that the pain is localized above her mastectomy scar.  Patient was initially hypoxic on EMS arrival with O2 sats of 85% on room air.  She was wheeze on nonrebreather which bumped up to 100%.  No recent fevers, congestion.  She has not had any sick contacts.  She does have a chronic cough but states no changes.  See note from previous provider for full history/physical exam.  Physical Exam  BP (!) 161/109   Pulse 98   Resp (!) 25   Ht 5\' 7"  (1.702 m)   Wt 83.9 kg   SpO2 95%   BMI 28.98 kg/m   Physical Exam   TTP noted to anterior left chest   ED Course/Procedures     Procedures  MDM    PLAN:  Patient received albuterol inhaler in the ED with improvement in breathing.   Patient pending COVID testing.  If COVID testing negative, will need CT chest for further characterization of effusion noted on chest x-ray.  Plan for admission.  MDM:  COVID testing negative.  Troponin is slightly elevated at 0.03.  Suspect this likely be from hypoxia. EKG without any acute changes.  Patient's chest pain is very atypical.  It is reproducible with palpation and is located mostly from her mastectomy scar.  Chest CT shows dense consolidative airspace disease in the left upper lobe with large partially loculated left pleural effusion and compressive atelectasis in the left lower lobe.  Pleural effusion, shortness of breath and hypoxia, will require admission.  Discussed patient with Dr. Posey Pronto (hospitalist). Will plan for admission.   Portions of this note were generated with [employer]. Dictation errors may occur despite best attempts at proofreading.    1. Pleural effusion   2. Hypoxia   3. SOB (shortness of breath)    4. Pleural effusion on left       Desma Mcgregor 05/12/18 3748    Merrily Pew, MD 05/12/18 [id number]

## 2018-05-12 ENCOUNTER — Encounter: Payer: Self-pay | Admitting: Hematology and Oncology

## 2018-05-12 ENCOUNTER — Encounter (HOSPITAL_COMMUNITY): Payer: Self-pay | Admitting: Radiology

## 2018-05-12 ENCOUNTER — Inpatient Hospital Stay (HOSPITAL_COMMUNITY): Payer: Medicare Other

## 2018-05-12 ENCOUNTER — Observation Stay (HOSPITAL_COMMUNITY): Payer: Medicare Other

## 2018-05-12 ENCOUNTER — Emergency Department (HOSPITAL_COMMUNITY): Payer: Medicare Other

## 2018-05-12 DIAGNOSIS — I248 Other forms of acute ischemic heart disease: Secondary | ICD-10-CM | POA: Diagnosis present

## 2018-05-12 DIAGNOSIS — I10 Essential (primary) hypertension: Secondary | ICD-10-CM | POA: Diagnosis present

## 2018-05-12 DIAGNOSIS — Z803 Family history of malignant neoplasm of breast: Secondary | ICD-10-CM | POA: Diagnosis not present

## 2018-05-12 DIAGNOSIS — Z923 Personal history of irradiation: Secondary | ICD-10-CM | POA: Diagnosis not present

## 2018-05-12 DIAGNOSIS — J90 Pleural effusion, not elsewhere classified: Secondary | ICD-10-CM

## 2018-05-12 DIAGNOSIS — F17210 Nicotine dependence, cigarettes, uncomplicated: Secondary | ICD-10-CM | POA: Diagnosis present

## 2018-05-12 DIAGNOSIS — Z66 Do not resuscitate: Secondary | ICD-10-CM | POA: Diagnosis present

## 2018-05-12 DIAGNOSIS — F329 Major depressive disorder, single episode, unspecified: Secondary | ICD-10-CM

## 2018-05-12 DIAGNOSIS — J449 Chronic obstructive pulmonary disease, unspecified: Secondary | ICD-10-CM

## 2018-05-12 DIAGNOSIS — Z9012 Acquired absence of left breast and nipple: Secondary | ICD-10-CM | POA: Diagnosis not present

## 2018-05-12 DIAGNOSIS — F419 Anxiety disorder, unspecified: Secondary | ICD-10-CM

## 2018-05-12 DIAGNOSIS — J440 Chronic obstructive pulmonary disease with acute lower respiratory infection: Secondary | ICD-10-CM | POA: Diagnosis present

## 2018-05-12 DIAGNOSIS — J910 Malignant pleural effusion: Secondary | ICD-10-CM | POA: Diagnosis present

## 2018-05-12 DIAGNOSIS — J189 Pneumonia, unspecified organism: Secondary | ICD-10-CM | POA: Diagnosis present

## 2018-05-12 DIAGNOSIS — Z853 Personal history of malignant neoplasm of breast: Secondary | ICD-10-CM | POA: Diagnosis not present

## 2018-05-12 DIAGNOSIS — Z79899 Other long term (current) drug therapy: Secondary | ICD-10-CM | POA: Diagnosis not present

## 2018-05-12 DIAGNOSIS — R0602 Shortness of breath: Secondary | ICD-10-CM | POA: Diagnosis present

## 2018-05-12 DIAGNOSIS — F32A Depression, unspecified: Secondary | ICD-10-CM

## 2018-05-12 DIAGNOSIS — J9601 Acute respiratory failure with hypoxia: Secondary | ICD-10-CM | POA: Diagnosis present

## 2018-05-12 DIAGNOSIS — Z171 Estrogen receptor negative status [ER-]: Secondary | ICD-10-CM | POA: Diagnosis not present

## 2018-05-12 DIAGNOSIS — C773 Secondary and unspecified malignant neoplasm of axilla and upper limb lymph nodes: Secondary | ICD-10-CM | POA: Diagnosis present

## 2018-05-12 DIAGNOSIS — Z9221 Personal history of antineoplastic chemotherapy: Secondary | ICD-10-CM | POA: Diagnosis not present

## 2018-05-12 DIAGNOSIS — Z23 Encounter for immunization: Secondary | ICD-10-CM | POA: Diagnosis present

## 2018-05-12 DIAGNOSIS — Z20828 Contact with and (suspected) exposure to other viral communicable diseases: Secondary | ICD-10-CM | POA: Diagnosis present

## 2018-05-12 DIAGNOSIS — C50912 Malignant neoplasm of unspecified site of left female breast: Secondary | ICD-10-CM | POA: Diagnosis present

## 2018-05-12 HISTORY — PX: IR THORACENTESIS ASP PLEURAL SPACE W/IMG GUIDE: IMG5380

## 2018-05-12 LAB — COMPREHENSIVE METABOLIC PANEL
ALT: 9 U/L (ref 0–44)
AST: 13 U/L — ABNORMAL LOW (ref 15–41)
Albumin: 3.7 g/dL (ref 3.5–5.0)
Alkaline Phosphatase: 57 U/L (ref 38–126)
Anion gap: 14 (ref 5–15)
BUN: 10 mg/dL (ref 8–23)
CO2: 24 mmol/L (ref 22–32)
Calcium: 9.2 mg/dL (ref 8.9–10.3)
Chloride: 103 mmol/L (ref 98–111)
Creatinine, Ser: 0.91 mg/dL (ref 0.44–1.00)
GFR calc Af Amer: >60 mL/min (ref >60)
GFR calc non Af Amer: >60 mL/min (ref >60)
Glucose, Bld: 130 mg/dL — ABNORMAL HIGH (ref 70–99)
Potassium: 3.5 mmol/L (ref 3.5–5.1)
Sodium: 141 mmol/L (ref 135–145)
Total Bilirubin: 0.6 mg/dL (ref 0.3–1.2)
Total Protein: 6.9 g/dL (ref 6.5–8.1)

## 2018-05-12 LAB — LACTATE DEHYDROGENASE: LDH: 160 U/L (ref 98–192)

## 2018-05-12 LAB — GRAM STAIN

## 2018-05-12 LAB — BODY FLUID CELL COUNT WITH DIFFERENTIAL
Eos, Fluid: 0 %
Lymphs, Fluid: 64 %
Monocyte-Macrophage-Serous Fluid: 34 % — ABNORMAL LOW (ref 50–90)
Neutrophil Count, Fluid: 2 % (ref 0–25)
Other Cells, Fluid: REACTIVE %
Total Nucleated Cell Count, Fluid: 535 cu mm (ref 0–1000)

## 2018-05-12 LAB — CBC
HCT: 43.8 % (ref 36.0–46.0)
Hemoglobin: 14.5 g/dL (ref 12.0–15.0)
MCH: 31.2 pg (ref 26.0–34.0)
MCHC: 33.1 g/dL (ref 30.0–36.0)
MCV: 94.2 fL (ref 80.0–100.0)
Platelets: 295 10*3/uL (ref 150–400)
RBC: 4.65 MIL/uL (ref 3.87–5.11)
RDW: 12.6 % (ref 11.5–15.5)
WBC: 5.5 10*3/uL (ref 4.0–10.5)
nRBC: 0 % (ref 0.0–0.2)

## 2018-05-12 LAB — AMYLASE, PLEURAL OR PERITONEAL FLUID: Amylase, Fluid: 60 U/L

## 2018-05-12 LAB — PROTEIN, PLEURAL OR PERITONEAL FLUID: Total protein, fluid: 4.7 g/dL

## 2018-05-12 LAB — GLUCOSE, PLEURAL OR PERITONEAL FLUID: Glucose, Fluid: 50 mg/dL

## 2018-05-12 LAB — LACTATE DEHYDROGENASE, PLEURAL OR PERITONEAL FLUID: LD, Fluid: 605 U/L — ABNORMAL HIGH (ref 3–23)

## 2018-05-12 LAB — TSH: TSH: 1 u[IU]/mL (ref 0.350–4.500)

## 2018-05-12 LAB — SARS CORONAVIRUS 2 BY RT PCR (HOSPITAL ORDER, PERFORMED IN ~~LOC~~ HOSPITAL LAB): SARS Coronavirus 2: NEGATIVE

## 2018-05-12 LAB — TROPONIN I: Troponin I: 0.03 ng/mL (ref <0.03)

## 2018-05-12 LAB — HIV ANTIBODY (ROUTINE TESTING W REFLEX): HIV Screen 4th Generation wRfx: NONREACTIVE

## 2018-05-12 MED ORDER — SODIUM CHLORIDE 0.9 % IV SOLN
1.0000 g | INTRAVENOUS | Status: DC
  Administered 2018-05-12 – 2018-05-15 (×4): 1 g via INTRAVENOUS
  Filled 2018-05-12 (×4): qty 10

## 2018-05-12 MED ORDER — OXYCODONE-ACETAMINOPHEN 5-325 MG PO TABS
1.0000 | ORAL_TABLET | Freq: Once | ORAL | Status: AC
  Administered 2018-05-12: 02:00:00 1 via ORAL
  Filled 2018-05-12: qty 1

## 2018-05-12 MED ORDER — OXYCODONE-ACETAMINOPHEN 5-325 MG PO TABS
1.0000 | ORAL_TABLET | Freq: Four times a day (QID) | ORAL | Status: DC | PRN
  Administered 2018-05-12 – 2018-05-15 (×11): 1 via ORAL
  Filled 2018-05-12 (×11): qty 1

## 2018-05-12 MED ORDER — ACETAMINOPHEN 325 MG PO TABS: 650.0000 mg | ORAL_TABLET | Freq: Four times a day (QID) | ORAL | Status: DC | PRN

## 2018-05-12 MED ORDER — ONDANSETRON HCL 4 MG/2ML IJ SOLN: 4.0000 mg | Freq: Four times a day (QID) | INTRAMUSCULAR | Status: DC | PRN

## 2018-05-12 MED ORDER — IOHEXOL 300 MG/ML  SOLN
75.0000 mL | Freq: Once | INTRAMUSCULAR | Status: AC | PRN
  Administered 2018-05-12: 01:00:00 75 mL via INTRAVENOUS

## 2018-05-12 MED ORDER — METOPROLOL TARTRATE 25 MG PO TABS
25.0000 mg | ORAL_TABLET | Freq: Two times a day (BID) | ORAL | Status: DC
  Administered 2018-05-12 – 2018-05-15 (×8): 25 mg via ORAL
  Filled 2018-05-12 (×9): qty 1

## 2018-05-12 MED ORDER — AMLODIPINE BESYLATE 5 MG PO TABS
5.0000 mg | ORAL_TABLET | Freq: Every day | ORAL | Status: DC
  Administered 2018-05-12 – 2018-05-15 (×4): 5 mg via ORAL
  Filled 2018-05-12 (×5): qty 1

## 2018-05-12 MED ORDER — HYDRALAZINE HCL 20 MG/ML IJ SOLN
5.0000 mg | INTRAMUSCULAR | Status: DC | PRN
  Administered 2018-05-12: 04:00:00 5 mg via INTRAVENOUS
  Filled 2018-05-12: qty 1

## 2018-05-12 MED ORDER — ACETAMINOPHEN 650 MG RE SUPP: 650.0000 mg | Freq: Four times a day (QID) | RECTAL | Status: DC | PRN

## 2018-05-12 MED ORDER — LIDOCAINE HCL 1 % IJ SOLN
INTRAMUSCULAR | Status: AC
  Administered 2018-05-12: 12:00:00
  Filled 2018-05-12: qty 20

## 2018-05-12 MED ORDER — OXYCODONE-ACETAMINOPHEN 5-325 MG PO TABS: 1.0000 | ORAL_TABLET | Freq: Three times a day (TID) | ORAL | Status: DC | PRN

## 2018-05-12 MED ORDER — GABAPENTIN 300 MG PO CAPS
300.0000 mg | ORAL_CAPSULE | Freq: Three times a day (TID) | ORAL | Status: DC
  Administered 2018-05-12 – 2018-05-15 (×11): 300 mg via ORAL
  Filled 2018-05-12 (×12): qty 1

## 2018-05-12 MED ORDER — ONDANSETRON HCL 4 MG PO TABS: 4.0000 mg | ORAL_TABLET | Freq: Four times a day (QID) | ORAL | Status: DC | PRN

## 2018-05-12 MED ORDER — SODIUM CHLORIDE 0.9% FLUSH
3.0000 mL | Freq: Two times a day (BID) | INTRAVENOUS | Status: DC
  Administered 2018-05-12 – 2018-05-15 (×8): 3 mL via INTRAVENOUS

## 2018-05-12 MED ORDER — PAROXETINE HCL 20 MG PO TABS
20.0000 mg | ORAL_TABLET | Freq: Two times a day (BID) | ORAL | Status: DC
  Administered 2018-05-12 – 2018-05-15 (×8): 20 mg via ORAL
  Filled 2018-05-12 (×9): qty 1

## 2018-05-12 MED ORDER — LIDOCAINE HCL (PF) 1 % IJ SOLN
INTRAMUSCULAR | Status: AC | PRN
  Administered 2018-05-12: 10 mL

## 2018-05-12 MED ORDER — PNEUMOCOCCAL VAC POLYVALENT 25 MCG/0.5ML IJ INJ
0.5000 mL | INJECTION | INTRAMUSCULAR | Status: AC | PRN
  Administered 2018-05-15: 15:00:00 0.5 mL via INTRAMUSCULAR
  Filled 2018-05-12: qty 0.5

## 2018-05-12 MED ORDER — ALBUTEROL SULFATE (2.5 MG/3ML) 0.083% IN NEBU: 3.0000 mL | INHALATION_SOLUTION | Freq: Four times a day (QID) | RESPIRATORY_TRACT | Status: DC | PRN

## 2018-05-12 MED ORDER — SODIUM CHLORIDE 0.9 % IV SOLN
500.0000 mg | INTRAVENOUS | Status: DC
  Administered 2018-05-12 – 2018-05-14 (×3): 500 mg via INTRAVENOUS
  Filled 2018-05-12 (×4): qty 500

## 2018-05-12 MED ORDER — HYDRALAZINE HCL 20 MG/ML IJ SOLN: 10.0000 mg | INTRAMUSCULAR | Status: DC | PRN

## 2018-05-12 NOTE — ED Notes (Signed)
Patient transported to CT 

## 2018-05-12 NOTE — Progress Notes (Signed)
PROGRESS NOTE    Deborah Mcintyre  PIR:[id number] DOB: [birth year] DOA: 05/11/2018 PCP: [name], MD   Brief Narrative 63 year old with past medical history significant for left side breast cancer treated with neoadjuvant chemotherapy status post left mastectomy followed by radiation and Xeloda chemotherapy, COPD, hypertension, current smoker who presents complaining of worsening shortness of breath and cough. Patient was found to be hypoxic with oxygen saturation 95 on room air, she was placed on non-rebreather by EMS. CT chest with contrast showed dense consolidation airspace disease in the left upper lobe with a large partially loculated left pleural effusion and compressive atelectasis of the left lower lobe.   Assessment & Plan:   Principal Problem:   Pleural effusion on left Active Problems:   Breast cancer of upper-outer quadrant of left female breast (HCC)   Hypertension   COPD (chronic obstructive pulmonary disease) (HCC)   Anxiety and depression   1-left side pleural effusion, left upper lobe consolidation; Patient underwent thoracentesis yielding 1.5 L. CT chest showed loculated left lower effusion and left upper lobe consolidation I have started IV antibiotics to cover for possible pneumonia Follow cytology Pleural fluid; white blood cell 535, monocyte 34, no organisms seen.  Protein 4.7, DH pending, body fluid culture pending LDH 160, Follow cytology Pleural effusion exudative by labs.  Follow culture to determine next step management.  2-acute hypoxic respiratory failure secondary to left pleural effusion and left upper lobe consolidation Taper off of oxygen as possible. Start IV antibiotic  3-HTN; continue with metoprolol.  Restart Norvasc blood pressure uncontrolled 4- History breast cancer will Dr. Payton Mccallum to the rounding list.  Concern for left pleural effusion to be malignant  Estimated body mass index is 28.93 kg/m as calculated from the following:  Height as of this encounter: 5' 7.5" (1.715 m).   Weight as of this encounter: 85 kg.   DVT prophylaxis:  Code Status: DNR Family Communication: Care discussed with patient Disposition Plan: Remain  in the hospital for further treatment of hypoxic respiratory failure, pleural effusion, for thoracentesis today, awaiting cytology, follow pleural fluid culture   Consultants:   Dr. Sonny Dandy added to the rounding list   Procedures:   none   Antimicrobials:   Ceftriaxone  Azithromycin   Subjective: Patient seen this morning, she report shortness of breath, cough.  She continued to smoke.  Counseling provided. She is currently on 3 L of oxygen  Objective: Vitals:   05/12/18 0030 05/12/18 0115 05/12/18 0245 05/12/18 0331  BP: (!) 161/109  (!) 168/111 (!) 166/110  Pulse: (!) 101 98 (!) 102   Resp: (!) 25     Temp:   97.8 F (36.6 C)   TempSrc:   Oral   SpO2: 93% 95% 94%   Weight:   85 kg   Height:   5' 7.5" (1.715 m)     Intake/Output Summary (Last 24 hours) at 05/12/2018 0938 Last data filed at 05/12/2018 0500 Gross per 24 hour  Intake --  Output 200 ml  Net -200 ml   Filed Weights   05/11/18 2140 05/12/18 0245  Weight: 83.9 kg 85 kg    Examination:  General exam: Appears calm and comfortable  Respiratory system: Decreased sound on the left side, crackles on the left side Cardiovascular system: S1 & S2 heard, RRR. No JVD, murmurs, rubs, gallops or clicks. No pedal edema. Gastrointestinal system: Abdomen is nondistended, soft and nontender. No organomegaly or masses felt. Normal bowel sounds heard. Central nervous system: Alert and  oriented. No focal neurological deficits. Extremities: Symmetric 5 x 5 power. Skin: No rashes, lesions or ulcers Psychiatry: Judgement and insight appear normal. Mood & affect appropriate.     Data Reviewed: I have personally reviewed following labs and imaging studies  CBC: Recent Labs  Lab 05/11/18 2154 05/12/18 0353  WBC 6.1  5.5  HGB 14.3 14.5  HCT 44.6 43.8  MCV 96.1 94.2  PLT 302 696   Basic Metabolic Panel: Recent Labs  Lab 05/11/18 2154 05/12/18 0353  NA 143 141  K 3.6 3.5  CL 104 103  CO2 28 24  GLUCOSE 117* 130*  BUN 10 10  CREATININE 0.99 0.91  CALCIUM 9.3 9.2   GFR: Estimated Creatinine Clearance: 72.6 mL/min (by C-G formula based on SCr of 0.91 mg/dL). Liver Function Tests: Recent Labs  Lab 05/12/18 0353  AST 13*  ALT 9  ALKPHOS 57  BILITOT 0.6  PROT 6.9  ALBUMIN 3.7   No results for input(s): LIPASE, AMYLASE in the last 168 hours. No results for input(s): AMMONIA in the last 168 hours. Coagulation Profile: No results for input(s): INR, PROTIME in the last 168 hours. Cardiac Enzymes: Recent Labs  Lab 05/11/18 2154 05/12/18 0353  TROPONINI 0.03* 0.03*   BNP (last 3 results) No results for input(s): PROBNP in the last 8760 hours. HbA1C: No results for input(s): HGBA1C in the last 72 hours. CBG: No results for input(s): GLUCAP in the last 168 hours. Lipid Profile: No results for input(s): CHOL, HDL, LDLCALC, TRIG, CHOLHDL, LDLDIRECT in the last 72 hours. Thyroid Function Tests: Recent Labs    05/12/18 0353  TSH 1.000   Anemia Panel: No results for input(s): VITAMINB12, FOLATE, FERRITIN, TIBC, IRON, RETICCTPCT in the last 72 hours. Sepsis Labs: No results for input(s): PROCALCITON, LATICACIDVEN in the last 168 hours.  Recent Results (from the past 240 hour(s))  SARS Coronavirus 2 (CEPHEID- Performed in [facility] hospital lab), Hosp Order     Status: None   Collection Time: 05/11/18 10:54 PM  Result Value Ref Range Status   SARS Coronavirus 2 NEGATIVE NEGATIVE Final    Comment: (NOTE) If result is NEGATIVE SARS-CoV-2 target nucleic acids are NOT DETECTED. The SARS-CoV-2 RNA is generally detectable in upper and lower  respiratory specimens during the acute phase of infection. The lowest  concentration of SARS-CoV-2 viral copies this assay can detect is 250    copies / mL. A negative result does not preclude SARS-CoV-2 infection  and should not be used as the sole basis for treatment or other  patient management decisions.  A negative result may occur with  improper specimen collection / handling, submission of specimen other  than nasopharyngeal swab, presence of viral mutation(s) within the  areas targeted by this assay, and inadequate number of viral copies  (<250 copies / mL). A negative result must be combined with clinical  observations, patient history, and epidemiological information. If result is POSITIVE SARS-CoV-2 target nucleic acids are DETECTED. The SARS-CoV-2 RNA is generally detectable in upper and lower  respiratory specimens dur ing the acute phase of infection.  Positive  results are indicative of active infection with SARS-CoV-2.  Clinical  correlation with patient history and other diagnostic information is  necessary to determine patient infection status.  Positive results do  not rule out bacterial infection or co-infection with other viruses. If result is PRESUMPTIVE POSTIVE SARS-CoV-2 nucleic acids MAY BE PRESENT.   A presumptive positive result was obtained on the submitted specimen  and  confirmed on repeat testing.  While 2019 novel coronavirus  (SARS-CoV-2) nucleic acids may be present in the submitted sample  additional confirmatory testing may be necessary for epidemiological  and / or clinical management purposes  to differentiate between  SARS-CoV-2 and other Sarbecovirus currently known to infect humans.  If clinically indicated additional testing with an alternate test  methodology [phone]) is advised. The SARS-CoV-2 RNA is generally  detectable in upper and lower respiratory sp ecimens during the acute  phase of infection. The expected result is Negative. Fact Sheet for Patients:  StrictlyIdeas.no Fact Sheet for Healthcare  Providers: BankingDealers.co.za This test is not yet approved or cleared by the [place] FDA and has been authorized for detection and/or diagnosis of SARS-CoV-2 by FDA under an Emergency Use Authorization (EUA).  This EUA will remain in effect (meaning this test can be used) for the duration of the COVID-19 declaration under Section 564(b)(1) of the Act, 21 U.S.C. section 360bbb-3(b)(1), unless the authorization is terminated or revoked sooner. Performed at [facility], [place] [address] [place], [place] [address]          Radiology Studies: Ct Chest W Contrast  Result Date: 05/12/2018 CLINICAL DATA:  Left side chest pain, shortness of breath EXAM: CT CHEST WITH CONTRAST TECHNIQUE: Multidetector CT imaging of the chest was performed during intravenous contrast administration. CONTRAST:  3mL OMNIPAQUE IOHEXOL 300 MG/ML  SOLN COMPARISON:  Chest x-ray 05/11/2018 FINDINGS: Cardiovascular: Heart is normal size. Scattered aortic calcifications. Aorta is normal size. Mediastinum/Nodes: Enlarged mediastinal lymph nodes. Right paratracheal lymph node has a short axis diameter of 1.7 cm on image 56. Prevascular/AP window lymph nodal mass has a short axis diameter of 1.7 cm. No axillary adenopathy or right hilar adenopathy. Difficult to assess the left hilum due to airspace disease and fluid. Lungs/Pleura: Near complete opacification of the left hemithorax due to large partially loculated left effusion and airspace disease. Dense consolidation in the left upper lobe. Compressive atelectasis in the left lower lobe. No effusion on the right. Ground-glass nodule posteriorly in the right lower lobe measures 11 mm. Upper Abdomen: Imaging into the upper abdomen shows no acute findings. Musculoskeletal: Prior left mastectomy.  No acute bony abnormality. IMPRESSION: Dense consolidative airspace disease in the left upper lobe with large partially loculated left pleural effusion and  compressive atelectasis in the left lower lobe. This could be related to pneumonia. A postobstructive process cannot be excluded. Mediastinal adenopathy. Prior left mastectomy. Electronically Signed   By: Rolm Baptise M.D.   On: 05/12/2018 01:25   Dg Chest Port 1 View  Result Date: 05/11/2018 CLINICAL DATA:  Shortness of breath EXAM: PORTABLE CHEST 1 VIEW COMPARISON:  02/04/2017 FINDINGS: There is near complete opacification of the left hemithorax. There is no significant shift of the mediastinum to the right. The cardiac silhouette is suboptimally evaluated secondary to the appearance of the left hemithorax. No definite pneumothorax. The right lung field appears essentially clear. Surgical clips are noted in the left axilla. IMPRESSION: Near complete opacification of the left hemithorax which may be secondary to a large left-sided pleural effusion and/or confluent airspace disease. An underlying mass cannot be excluded. Electronically Signed   By: Constance Holster M.D.   On: 05/11/2018 21:57        Scheduled Meds:  amLODipine  5 mg Oral Daily   gabapentin  300 mg Oral TID   metoprolol tartrate  25 mg Oral BID   PARoxetine  20 mg Oral BID  sodium chloride flush  3 mL Intravenous Q12H   Continuous Infusions:  azithromycin     cefTRIAXone (ROCEPHIN)  IV       LOS: 0 days    Time spent: 35 minutes    Elmarie Shiley, MD [facility] Pager [phone]  If 7PM-7AM, please contact night-coverage [email or web address] Password [facility] 05/12/2018, 9:38 AM

## 2018-05-12 NOTE — Progress Notes (Signed)
Patient transferred by transport to IR in bed for thoracentesis.

## 2018-05-12 NOTE — H&P (Signed)
History and Physical    Deborah Mcintyre VOH:[id number] DOB: 10-27-1955 DOA: 05/11/2018  PCP: [name], MD  Patient coming from: Home  I have personally briefly reviewed patient's old medical records in [place]  Chief Complaint: Shortness of breath  HPI: Deborah Mcintyre is a 63 y.o. female with medical history significant for left-sided breast cancer treated with neoadjuvant chemotherapy s/p left mastectomy followed by XRT and Xeloda chemotherapy, COPD, hypertension, anxiety, and depression who presents to the ED with 4 days of progressive shortness of breath occurring with activity.  She reports associated left-sided chest pain localized near her mastectomy scar as well as pain in her lower left back and lower lateral chest wall.  She reports a chronic cough related to her COPD which is nonproductive and unchanged.  She reports recent weight loss which he attributes to decreased oral intake as she is having several teeth pulled by her dentist.  She reports occasional diaphoresis.  She denies any fevers, abdominal pain, nausea, vomiting, diarrhea, or dysuria.  Due to worsening shortness of breath she called EMS.  On EMS arrival her oxygen saturation was between 80-85% on room air.  She was placed on a nonrebreather with improvement in O2 saturation to 100% brought to the ED.  ED Course:  Initial vitals showed BP 120/95, pulse 101, RR 18, SPO2 95% on room air.  Initial labs notable for serum glucose 117 and troponin I 0.03, otherwise CBC and metabolic panel were unremarkable.  SARS-CoV-2 test was negative.  She was given an albuterol treatment and Percocet for pain.  Portable chest x-ray showed near complete opacification of the left lung field.  CT chest with contrast showed dense consolidative airspace disease in the left upper lobe with a large partially loculated left pleural effusion and compressive atelectasis in the left lower lobe.  The hospitalist service was  consulted to admit for further evaluation and management of left-sided pleural effusion.  Review of Systems: As per HPI otherwise 10 point review of systems negative.    Past Medical History:  Diagnosis Date   Anxiety    Arthritis    Bronchitis    COPD (chronic obstructive pulmonary disease) (HCC)    Depression    Hypertension    Malignant neoplasm of upper-outer quadrant of left female breast ([facility]) 01/04/2015   Nocturia    Numbness and tingling    toes - bilateral   Pneumonia    PONV (postoperative nausea and vomiting)    Nausea    Past Surgical History:  Procedure Laterality Date   BREAST LUMPECTOMY Bilateral    x3   BREAST LUMPECTOMY WITH RADIOACTIVE SEED LOCALIZATION Right 08/23/2015   Procedure: RIGHT BREAST LUMPECTOMY WITH RADIOACTIVE SEED LOCALIZATION;  Surgeon: Alphonsa Overall, MD;  Location: [facility];  Service: General;  Laterality: Right;   CESAREAN SECTION     x4   IR REMOVAL TUN ACCESS W/ PORT W/O FL MOD SED  10/20/2016   MASTECTOMY Left 08/23/2015    RIGHT BREAST LUMPECTOMY WITH RADIOACTIVE SEED LOCALIZATION,  LEFT MASTECTOMY WITH AXILLARY LYMPH NODE DISSECTION   MASTECTOMY WITH AXILLARY LYMPH NODE DISSECTION Left 08/23/2015   Procedure: LEFT MASTECTOMY WITH AXILLARY LYMPH NODE DISSECTION;  Surgeon: Alphonsa Overall, MD;  Location: [facility];  Service: General;  Laterality: Left;   MULTIPLE TOOTH EXTRACTIONS       reports that she has been smoking cigarettes. She has a 33.75 pack-year smoking history. She has never used smokeless tobacco. She reports current alcohol use. She  reports that she does not use drugs.  No Known Allergies  Family History  Problem Relation Age of Onset   Breast cancer Maternal Aunt      Prior to Admission medications   Medication Sig Start Date End Date Taking? Authorizing Provider  albuterol (PROVENTIL HFA;VENTOLIN HFA) 108 (90 Base) MCG/ACT inhaler Inhale 1-2 puffs into the lungs every 6 (six) hours as needed for wheezing or  shortness of breath.   Yes [provider]  albuterol (PROVENTIL) (2.5 MG/3ML) 0.083% nebulizer solution Take 2.5 mg by nebulization every 6 (six) hours as needed for wheezing or shortness of breath.   Yes [provider]  ALPRAZolam (XANAX) 0.5 MG tablet Take 1 tablet (0.5 mg total) by mouth 3 (three) times daily as needed for anxiety or sleep. 08/13/15  Yes Nicholas Lose, MD  ferrous sulfate 325 (65 FE) MG tablet Take 1 tablet (325 mg total) by mouth daily with breakfast. 02/10/17  Yes [name], MD  gabapentin (NEURONTIN) 300 MG capsule Take 1 capsule (300 mg total) by mouth 3 (three) times daily. 09/23/17  Yes Nicholas Lose, MD  metoprolol (LOPRESSOR) 50 MG tablet Take 0.5 tablets (25 mg total) by mouth 2 (two) times daily. 08/07/13  Yes [name], MD  ondansetron (ZOFRAN) 8 MG tablet Take 1 tablet (8 mg total) by mouth every 8 (eight) hours as needed for nausea. 09/23/17  Yes Nicholas Lose, MD  oxyCODONE-acetaminophen (PERCOCET/ROXICET) 5-325 MG tablet Take 1 tablet by mouth every 8 (eight) hours as needed for moderate pain or severe pain (1 tab every 4-6 hour prn pain). 04/27/18  Yes Nicholas Lose, MD  PARoxetine (PAXIL) 20 MG tablet Take 20 mg by mouth 2 (two) times daily.    Yes [provider]  PREMARIN vaginal cream INSERT 1 APPLICATORFUL VAGINALLY DAILY Patient taking differently: Place 1 Applicatorful vaginally daily.  04/19/17  Yes Nicholas Lose, MD    Physical Exam: Vitals:   05/11/18 2200 05/11/18 2300 05/12/18 0030 05/12/18 0115  BP: (!) 128/95 (!) 144/90 (!) 161/109   Pulse: 100 97 (!) 101 98  Resp: 18 19 (!) 25   SpO2: 95% 92% 93% 95%  Weight:      Height:        Constitutional: Resting supine in bed, NAD, calm, comfortable Eyes: PERRL, lids and conjunctivae normal ENMT: Mucous membranes are moist. Posterior pharynx clear of any exudate or lesions.multiple missing teeth. Neck: normal, supple, no masses. Respiratory: Expiratory wheezing  bilaterally, diminished breath sounds throughout the left lung field.  Normal respiratory effort. No accessory muscle use.  Cardiovascular: Regular rate and rhythm, no murmurs / rubs / gallops. No extremity edema.  Abdomen: no tenderness, no masses palpated. No hepatosplenomegaly. Bowel sounds positive.  Musculoskeletal: no clubbing / cyanosis. No joint deformity upper and lower extremities. Good ROM, no contractures. Normal muscle tone.  Skin: Left mastectomy scar appears well-healed. Neurologic: CN 2-12 grossly intact. Sensation intact,Strength 5/5 in all 4.  Psychiatric: Normal judgment and insight. Alert and oriented x 3. Normal mood.     Labs on Admission: I have personally reviewed following labs and imaging studies  CBC: Recent Labs  Lab 05/11/18 2154  WBC 6.1  HGB 14.3  HCT 44.6  MCV 96.1  PLT 027   Basic Metabolic Panel: Recent Labs  Lab 05/11/18 2154  NA 143  K 3.6  CL 104  CO2 28  GLUCOSE 117*  BUN 10  CREATININE 0.99  CALCIUM 9.3   GFR: Estimated Creatinine Clearance: 65.6 mL/min (  by C-G formula based on SCr of 0.99 mg/dL). Liver Function Tests: No results for input(s): AST, ALT, ALKPHOS, BILITOT, PROT, ALBUMIN in the last 168 hours. No results for input(s): LIPASE, AMYLASE in the last 168 hours. No results for input(s): AMMONIA in the last 168 hours. Coagulation Profile: No results for input(s): INR, PROTIME in the last 168 hours. Cardiac Enzymes: Recent Labs  Lab 05/11/18 2154  TROPONINI 0.03*   BNP (last 3 results) No results for input(s): PROBNP in the last 8760 hours. HbA1C: No results for input(s): HGBA1C in the last 72 hours. CBG: No results for input(s): GLUCAP in the last 168 hours. Lipid Profile: No results for input(s): CHOL, HDL, LDLCALC, TRIG, CHOLHDL, LDLDIRECT in the last 72 hours. Thyroid Function Tests: No results for input(s): TSH, T4TOTAL, FREET4, T3FREE, THYROIDAB in the last 72 hours. Anemia Panel: No results for input(s):  VITAMINB12, FOLATE, FERRITIN, TIBC, IRON, RETICCTPCT in the last 72 hours. Urine analysis:    Component Value Date/Time   COLORURINE YELLOW 12/06/2014 2207   APPEARANCEUR CLOUDY (A) 12/06/2014 2207   LABSPEC 1.025 12/06/2014 2207   PHURINE 5.5 12/06/2014 2207   GLUCOSEU NEGATIVE 12/06/2014 2207   HGBUR MODERATE (A) 12/06/2014 2207   BILIRUBINUR NEGATIVE 12/06/2014 2207   KETONESUR NEGATIVE 12/06/2014 2207   PROTEINUR NEGATIVE 12/06/2014 2207   UROBILINOGEN 1.0 07/30/2013 1527   NITRITE POSITIVE (A) 12/06/2014 2207   LEUKOCYTESUR SMALL (A) 12/06/2014 2207    Radiological Exams on Admission: Ct Chest W Contrast  Result Date: 05/12/2018 CLINICAL DATA:  Left side chest pain, shortness of breath EXAM: CT CHEST WITH CONTRAST TECHNIQUE: Multidetector CT imaging of the chest was performed during intravenous contrast administration. CONTRAST:  69mL OMNIPAQUE IOHEXOL 300 MG/ML  SOLN COMPARISON:  Chest x-ray 05/11/2018 FINDINGS: Cardiovascular: Heart is normal size. Scattered aortic calcifications. Aorta is normal size. Mediastinum/Nodes: Enlarged mediastinal lymph nodes. Right paratracheal lymph node has a short axis diameter of 1.7 cm on image 56. Prevascular/AP window lymph nodal mass has a short axis diameter of 1.7 cm. No axillary adenopathy or right hilar adenopathy. Difficult to assess the left hilum due to airspace disease and fluid. Lungs/Pleura: Near complete opacification of the left hemithorax due to large partially loculated left effusion and airspace disease. Dense consolidation in the left upper lobe. Compressive atelectasis in the left lower lobe. No effusion on the right. Ground-glass nodule posteriorly in the right lower lobe measures 11 mm. Upper Abdomen: Imaging into the upper abdomen shows no acute findings. Musculoskeletal: Prior left mastectomy.  No acute bony abnormality. IMPRESSION: Dense consolidative airspace disease in the left upper lobe with large partially loculated left  pleural effusion and compressive atelectasis in the left lower lobe. This could be related to pneumonia. A postobstructive process cannot be excluded. Mediastinal adenopathy. Prior left mastectomy. Electronically Signed   By: Rolm Baptise M.D.   On: 05/12/2018 01:25   Dg Chest Port 1 View  Result Date: 05/11/2018 CLINICAL DATA:  Shortness of breath EXAM: PORTABLE CHEST 1 VIEW COMPARISON:  02/04/2017 FINDINGS: There is near complete opacification of the left hemithorax. There is no significant shift of the mediastinum to the right. The cardiac silhouette is suboptimally evaluated secondary to the appearance of the left hemithorax. No definite pneumothorax. The right lung field appears essentially clear. Surgical clips are noted in the left axilla. IMPRESSION: Near complete opacification of the left hemithorax which may be secondary to a large left-sided pleural effusion and/or confluent airspace disease. An underlying mass cannot be excluded. Electronically  Signed   By: Constance Holster M.D.   On: 05/11/2018 21:57    EKG: Independently reviewed. Sinus rhythm, RBBB and LPFB.  No significant change from prior except sinus tachycardia has improved.  Assessment/Plan Principal Problem:   Pleural effusion on left Active Problems:   Breast cancer of upper-outer quadrant of left female breast (HCC)   Hypertension   COPD (chronic obstructive pulmonary disease) (HCC)   Anxiety and depression  Deborah Mcintyre is a 63 y.o. female with medical history significant for left-sided breast cancer treated with neoadjuvant chemotherapy s/p left mastectomy followed by XRT and Xeloda chemotherapy, COPD, hypertension, anxiety, and depression who is admitted with acute respiratory distress and hypoxia due to large left-sided pleural effusion.   Left-sided pleural effusion: Concerning for malignancy association given her history of left-sided breast cancer.  Currently no signs/symptoms of an infectious process.  I  discussed the possibility for diagnostic and therapeutic thoracentesis for which she is agreeable to proceed with. -Order for IR thoracentesis with labs placed -Keep n.p.o. for now, hold pharmacologic VTE prophylaxis -Continue pain control as needed  Left-sided breast cancer: Treated with neoadjuvant chemotherapy followed by left mastectomy radiation and Xeloda.  She follows with oncology, Dr. Lindi Adie.  Will evaluate pleural effusion as above.  She has chronic postmastectomy pain. -Continue pain control -Thoracentesis as above, likely needs oncology input afterwards  Borderline troponin elevation: Likely due to demand ischemia from hypoxia.  Will repeat in a.m. and trend if rising.  COPD: Has some expiratory wheezing bilaterally, but presenting symptoms more likely due to her large left-sided pleural effusion. -Continue supplemental oxygen and albuterol nebulizers as needed  Hypertension: Hypertensive on admission. -Resume home metoprolol, IV hydralazine as needed  Anxiety and depression: -Continue home Paxil   DVT prophylaxis: SCDs Code Status: DNR/DNI, confirmed with patient at bedside Family Communication: Discussed with patient, she declined when asked if she would like me to speak to any family Disposition Plan: Pending thoracentesis and stabilization in respiratory status Consults called: IR order placed Admission status: Observation   Zada Finders MD [facility] Pager [phone]  If 7PM-7AM, please contact night-coverage [email or web address]  05/12/2018, 2:41 AM

## 2018-05-12 NOTE — ED Notes (Signed)
ED TO INPATIENT HANDOFF REPORT  ED Nurse Name and Phone #: Gifford Shave [phone]  S Name/Age/Gender Deborah Mcintyre 63 y.o. female Room/Bed: 022C/022C  Code Status   Code Status: Prior  Home/SNF/Other Home Patient oriented to: self, place, time and situation Is this baseline? Yes   Triage Complete: Triage complete  Chief Complaint SOB  Triage Note Pt brought to ED by [facility] from home for increase SOB x 4 days, hx of COPD not getting better with neb treatment. SPO2 low 85% on RA on EMS arrival, 125 mg Solumedrol given by EMS pta. Temp 97.3., BP 156 84, HR 122, SPO2 99% on NRM.   Allergies No Known Allergies  Level of Care/Admitting Diagnosis ED Disposition    ED Disposition Condition Comment   Admit  The patient appears reasonably stabilized for admission considering the current resources, flow, and capabilities available in the ED at this time, and I doubt any other [facility] requiring further screening and/or treatment in the ED prior to admission is  present.       B Medical/Surgery History Past Medical History:  Diagnosis Date  . Anxiety   . Arthritis   . Bronchitis   . COPD (chronic obstructive pulmonary disease) ([place])   . Depression   . Hypertension   . Malignant neoplasm of upper-outer quadrant of left female breast ([facility]) 01/04/2015  . Nocturia   . Numbness and tingling    toes - bilateral  . Pneumonia   . PONV (postoperative nausea and vomiting)    Nausea   Past Surgical History:  Procedure Laterality Date  . BREAST LUMPECTOMY Bilateral    x3  . BREAST LUMPECTOMY WITH RADIOACTIVE SEED LOCALIZATION Right 08/23/2015   Procedure: RIGHT BREAST LUMPECTOMY WITH RADIOACTIVE SEED LOCALIZATION;  Surgeon: Alphonsa Overall, MD;  Location: [facility];  Service: General;  Laterality: Right;  . CESAREAN SECTION     x4  . IR REMOVAL TUN ACCESS W/ PORT W/O FL MOD SED  10/20/2016  . MASTECTOMY Left 08/23/2015    RIGHT BREAST LUMPECTOMY WITH RADIOACTIVE SEED LOCALIZATION,   LEFT MASTECTOMY WITH AXILLARY LYMPH NODE DISSECTION  . MASTECTOMY WITH AXILLARY LYMPH NODE DISSECTION Left 08/23/2015   Procedure: LEFT MASTECTOMY WITH AXILLARY LYMPH NODE DISSECTION;  Surgeon: Alphonsa Overall, MD;  Location: [facility];  Service: General;  Laterality: Left;  [name] MULTIPLE TOOTH EXTRACTIONS       A IV Location/Drains/Wounds Patient Lines/Drains/Airways Status   Active Line/Drains/Airways    Name:   Placement date:   Placement time:   Site:   Days:   Peripheral IV 05/12/18 Right Hand   05/12/18    -    Hand   less than 1   Closed System Drain 1 Left;Ventral;Lateral Chest Bulb (JP) 9 Fr.   08/23/15    1242    Chest   993   Closed System Drain 2 Left;Ventral;Midline Chest Bulb (JP) 9 Fr.   08/23/15    1243    Chest   993   Incision (Closed) 08/23/15 Breast Bilateral   08/23/15    1241     993          Intake/Output Last 24 hours No intake or output data in the 24 hours ending 05/12/18 0144  Labs/Imaging Results for orders placed or performed during the hospital encounter of 05/11/18 (from the past 48 hour(s))  Basic metabolic panel     Status: Abnormal   Collection Time: 05/11/18  9:54 PM  Result Value Ref Range  Sodium 143 135 - 145 mmol/L   Potassium 3.6 3.5 - 5.1 mmol/L   Chloride 104 98 - 111 mmol/L   CO2 28 22 - 32 mmol/L   Glucose, Bld 117 (H) 70 - 99 mg/dL   BUN 10 8 - 23 mg/dL   Creatinine, Ser 0.99 0.44 - 1.00 mg/dL   Calcium 9.3 8.9 - 10.3 mg/dL   GFR calc non Af Amer >60 >60 mL/min   GFR calc Af Amer >60 >60 mL/min   Anion gap 11 5 - 15    Comment: Performed at [facility] [address], [place], [place] [address]  CBC     Status: None   Collection Time: 05/11/18  9:54 PM  Result Value Ref Range   WBC 6.1 4.0 - 10.5 K/uL   RBC 4.64 3.87 - 5.11 MIL/uL   Hemoglobin 14.3 12.0 - 15.0 g/dL   HCT 44.6 36.0 - 46.0 %   MCV 96.1 80.0 - 100.0 fL   MCH 30.8 26.0 - 34.0 pg   MCHC 32.1 30.0 - 36.0 g/dL   RDW 12.6 11.5 - 15.5 %   Platelets 302 150 - 400  K/uL   nRBC 0.0 0.0 - 0.2 %    Comment: Performed at [facility], [place] [address], [place], [place]  [address]  Troponin I - Once     Status: Abnormal   Collection Time: 05/11/18  9:54 PM  Result Value Ref Range   Troponin I 0.03 (HH) <0.03 ng/mL    Comment: CRITICAL RESULT CALLED TO, READ BACK BY AND VERIFIED WITH: Jazariah Teall P,RN 05/11/18 2303 WAYK Performed at [facility] [address] [place], [place] [address]   SARS Coronavirus 2 (CEPHEID- Performed in [facility] hospital lab), Hosp Order     Status: None   Collection Time: 05/11/18 10:54 PM  Result Value Ref Range   SARS Coronavirus 2 NEGATIVE NEGATIVE    Comment: (NOTE) If result is NEGATIVE SARS-CoV-2 target nucleic acids are NOT DETECTED. The SARS-CoV-2 RNA is generally detectable in upper and lower  respiratory specimens during the acute phase of infection. The lowest  concentration of SARS-CoV-2 viral copies this assay can detect is 250  copies / mL. A negative result does not preclude SARS-CoV-2 infection  and should not be used as the sole basis for treatment or other  patient management decisions.  A negative result may occur with  improper specimen collection / handling, submission of specimen other  than nasopharyngeal swab, presence of viral mutation(s) within the  areas targeted by this assay, and inadequate number of viral copies  (<250 copies / mL). A negative result must be combined with clinical  observations, patient history, and epidemiological information. If result is POSITIVE SARS-CoV-2 target nucleic acids are DETECTED. The SARS-CoV-2 RNA is generally detectable in upper and lower  respiratory specimens dur ing the acute phase of infection.  Positive  results are indicative of active infection with SARS-CoV-2.  Clinical  correlation with patient history and other diagnostic information is  necessary to determine patient infection status.  Positive results do  not rule out bacterial  infection or co-infection with other viruses. If result is PRESUMPTIVE POSTIVE SARS-CoV-2 nucleic acids MAY BE PRESENT.   A presumptive positive result was obtained on the submitted specimen  and confirmed on repeat testing.  While 2019 novel coronavirus  (SARS-CoV-2) nucleic acids may be present in the submitted sample  additional confirmatory testing may be necessary for epidemiological  and / or clinical management purposes  to differentiate between  SARS-CoV-2 and other Sarbecovirus currently known to infect humans.  If clinically indicated additional testing with an alternate test  methodology [phone]) is advised. The SARS-CoV-2 RNA is generally  detectable in upper and lower respiratory sp ecimens during the acute  phase of infection. The expected result is Negative. Fact Sheet for Patients:  StrictlyIdeas.no Fact Sheet for Healthcare Providers: BankingDealers.co.za This test is not yet approved or cleared by the [place] FDA and has been authorized for detection and/or diagnosis of SARS-CoV-2 by FDA under an Emergency Use Authorization (EUA).  This EUA will remain in effect (meaning this test can be used) for the duration of the COVID-19 declaration under Section 564(b)(1) of the Act, 21 U.S.C. section 360bbb-3(b)(1), unless the authorization is terminated or revoked sooner. Performed at [facility], [place] [address], [place], [place] [address]    Ct Chest W Contrast  Result Date: 05/12/2018 CLINICAL DATA:  Left side chest pain, shortness of breath EXAM: CT CHEST WITH CONTRAST TECHNIQUE: Multidetector CT imaging of the chest was performed during intravenous contrast administration. CONTRAST:  4mL OMNIPAQUE IOHEXOL 300 MG/ML  SOLN COMPARISON:  Chest x-ray 05/11/2018 FINDINGS: Cardiovascular: Heart is normal size. Scattered aortic calcifications. Aorta is normal size. Mediastinum/Nodes: Enlarged mediastinal lymph nodes.  Right paratracheal lymph node has a short axis diameter of 1.7 cm on image 56. Prevascular/AP window lymph nodal mass has a short axis diameter of 1.7 cm. No axillary adenopathy or right hilar adenopathy. Difficult to assess the left hilum due to airspace disease and fluid. Lungs/Pleura: Near complete opacification of the left hemithorax due to large partially loculated left effusion and airspace disease. Dense consolidation in the left upper lobe. Compressive atelectasis in the left lower lobe. No effusion on the right. Ground-glass nodule posteriorly in the right lower lobe measures 11 mm. Upper Abdomen: Imaging into the upper abdomen shows no acute findings. Musculoskeletal: Prior left mastectomy.  No acute bony abnormality. IMPRESSION: Dense consolidative airspace disease in the left upper lobe with large partially loculated left pleural effusion and compressive atelectasis in the left lower lobe. This could be related to pneumonia. A postobstructive process cannot be excluded. Mediastinal adenopathy. Prior left mastectomy. Electronically Signed   By: Rolm Baptise M.D.   On: 05/12/2018 01:25   Dg Chest Port 1 View  Result Date: 05/11/2018 CLINICAL DATA:  Shortness of breath EXAM: PORTABLE CHEST 1 VIEW COMPARISON:  02/04/2017 FINDINGS: There is near complete opacification of the left hemithorax. There is no significant shift of the mediastinum to the right. The cardiac silhouette is suboptimally evaluated secondary to the appearance of the left hemithorax. No definite pneumothorax. The right lung field appears essentially clear. Surgical clips are noted in the left axilla. IMPRESSION: Near complete opacification of the left hemithorax which may be secondary to a large left-sided pleural effusion and/or confluent airspace disease. An underlying mass cannot be excluded. Electronically Signed   By: Constance Holster M.D.   On: 05/11/2018 21:57    Pending Labs Unresulted Labs (From admission, onward)   None       Vitals/Pain Today's Vitals   05/11/18 2300 05/12/18 0013 05/12/18 0030 05/12/18 0115  BP: (!) 144/90  (!) 161/109   Pulse: 97  (!) 101 98  Resp: 19  (!) 25   SpO2: 92%  93% 95%  Weight:      Height:      PainSc:  8  Isolation Precautions Droplet and Contact precautions  Medications Medications  oxyCODONE-acetaminophen (PERCOCET/ROXICET) 5-325 MG per tablet 1 tablet (has no administration in time range)  albuterol (VENTOLIN HFA) 108 (90 Base) MCG/ACT inhaler 4-8 puff (4 puffs Inhalation Given 05/11/18 2249)  iohexol (OMNIPAQUE) 300 MG/ML solution 75 mL (75 mLs Intravenous Contrast Given 05/12/18 0103)    Mobility walks Low fall risk   Focused Assessments    R Recommendations: See Admitting Provider Note  Report given to:   Additional Notes:

## 2018-05-12 NOTE — Procedures (Addendum)
PROCEDURE SUMMARY:  Successful image-guided left thoracentesis. Yielded 1.55 liters of hazy gold fluid. Procedure was stopped after 1.55 L due to patient coughing/discomfort. Patient tolerated procedure well. No immediate complications. EBL = 0 mL.  Specimen was sent for labs. CXR ordered.  Torri Langston PA-C 05/12/2018 1:02 PM

## 2018-05-13 DIAGNOSIS — Z9221 Personal history of antineoplastic chemotherapy: Secondary | ICD-10-CM

## 2018-05-13 DIAGNOSIS — Z8701 Personal history of pneumonia (recurrent): Secondary | ICD-10-CM

## 2018-05-13 DIAGNOSIS — Z9012 Acquired absence of left breast and nipple: Secondary | ICD-10-CM

## 2018-05-13 DIAGNOSIS — C773 Secondary and unspecified malignant neoplasm of axilla and upper limb lymph nodes: Secondary | ICD-10-CM

## 2018-05-13 DIAGNOSIS — Z853 Personal history of malignant neoplasm of breast: Secondary | ICD-10-CM

## 2018-05-13 LAB — PH, BODY FLUID: pH, Body Fluid: 6.1

## 2018-05-13 LAB — CBC
HCT: 42.6 % (ref 36.0–46.0)
Hemoglobin: 13.9 g/dL (ref 12.0–15.0)
MCH: 31 pg (ref 26.0–34.0)
MCHC: 32.6 g/dL (ref 30.0–36.0)
MCV: 95.1 fL (ref 80.0–100.0)
Platelets: 270 10*3/uL (ref 150–400)
RBC: 4.48 MIL/uL (ref 3.87–5.11)
RDW: 12.5 % (ref 11.5–15.5)
WBC: 6.5 10*3/uL (ref 4.0–10.5)
nRBC: 0 % (ref 0.0–0.2)

## 2018-05-13 LAB — BASIC METABOLIC PANEL
Anion gap: 8 (ref 5–15)
BUN: 18 mg/dL (ref 8–23)
CO2: 28 mmol/L (ref 22–32)
Calcium: 8.7 mg/dL — ABNORMAL LOW (ref 8.9–10.3)
Chloride: 104 mmol/L (ref 98–111)
Creatinine, Ser: 0.8 mg/dL (ref 0.44–1.00)
GFR calc Af Amer: >60 mL/min (ref >60)
GFR calc non Af Amer: >60 mL/min (ref >60)
Glucose, Bld: 96 mg/dL (ref 70–99)
Potassium: 4 mmol/L (ref 3.5–5.1)
Sodium: 140 mmol/L (ref 135–145)

## 2018-05-13 MED ORDER — ALPRAZOLAM 0.5 MG PO TABS
0.5000 mg | ORAL_TABLET | Freq: Three times a day (TID) | ORAL | Status: DC | PRN
  Administered 2018-05-13 (×2): 0.5 mg via ORAL
  Filled 2018-05-13 (×2): qty 1

## 2018-05-13 NOTE — Progress Notes (Signed)
Nutrition Brief Note  Patient identified on the Malnutrition Screening Tool (MST) Report  Patient with insignificant weight loss. Currently consuming 100% of meals.   Wt Readings from Last 15 Encounters:  05/13/18 83.5 kg  09/23/17 90.9 kg  02/10/17 95.2 kg  01/07/17 92 kg  07/06/16 91.6 kg  07/02/16 92.1 kg  05/25/16 90.1 kg  04/13/16 92.9 kg  02/25/16 92.7 kg  02/03/16 91.2 kg  01/27/16 91.8 kg  01/03/16 91.1 kg  12/27/15 91.2 kg  12/24/15 91.9 kg  12/13/15 90.7 kg    Body mass index is 28.39 kg/m. Patient meets criteria for overweight based on current BMI.   Current diet order is regular, patient is consuming approximately 100% of meals at this time. Labs and medications reviewed.   No nutrition interventions warranted at this time. If nutrition issues arise, please consult RD.   Clayton Bibles, MS, RD, [place] Dietitian Pager: [phone] After Hours Pager: [phone]

## 2018-05-13 NOTE — Progress Notes (Signed)
HEMATOLOGY-ONCOLOGY PROGRESS NOTE  SUBJECTIVE: Shortness of breath exertion with history of chronic cough and COPD left-sided chest pain which is chronic, CT chest in the emergency room showed left upper lobe consolidation with a large loculated left pleural effusion.  Thoracentesis was performed and results showed that it is an exudate.  Cytology is pending.    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    12/26/2014 Initial Diagnosis    Left breast biopsy: Invasive ductal carcinoma with lymphovascular invasion, 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, grade 3, ER/PR negative, Ki-67 80% HER-2 Neg    01/18/2015 - 06/21/2015 Neo-Adjuvant Chemotherapy    Dose dense Adriamycin and Cytoxan 4 followed by Taxol and carboplatin [id number]    07/03/2015 Breast MRI    Improvement in multiple areas of enhancement largest area 3.4 cm other suspicious nodules anterior to the mass also improved, left axillary internal mammary lymph nodes improved    08/23/2015 Surgery    Left mastectomy with axillary lymph node dissection: Invasive ductal carcinoma with calcifications grade 3, 1.3 cm, image, margins negative, 1/6 lymph nodes positive with extracapsular extension, ER 0%, 0%, T1cN1a stage II a     10/31/2015 - 01/08/2016 Radiation Therapy    Adj XRT Lisbeth Renshaw): Left Chest Wall and Left SCLV treated to 50.4 Gy in 28 fractions of 1.8 Gy. Left Chest Wall was then boosted to 60.4 Gy in 5 fractions of 2 Gy.    02/03/2016 - 08/03/2016 Chemotherapy    Xeloda 1500 mg by mouth twice a day 2 weeks on one week off    02/04/2017 - 02/10/2017 Hospital Admission    Admitted for bilateral pneumonia     OBJECTIVE: REVIEW OF SYSTEMS:   Constitutional: Denies fevers,  chills, mild weight loss Eyes: Denies blurriness of vision Ears, nose, mouth, throat, and face: Denies mucositis or sore throat Respiratory: Shortness of breath much improved after thoracentesis. Cardiovascular: Denies palpitation, chest discomfort Gastrointestinal:  Denies nausea, heartburn or change in bowel habits Skin: Denies abnormal skin rashes Lymphatics: Denies new lymphadenopathy or easy bruising Neurological:Denies numbness, tingling or new weaknesses Behavioral/Psych: Mood is stable, no new changes  Extremities: No lower extremity edema All other systems were reviewed with the patient and are negative.  I have reviewed the past medical history, past surgical history, social history and family history with the patient and they are unchanged from previous note.   PHYSICAL EXAMINATION: ECOG PERFORMANCE STATUS: 1 - Symptomatic but completely ambulatory  Vitals:   05/13/18 0401 05/13/18 0845  BP: 106/66 132/87  Pulse: (!) 58 77  Resp: 17   Temp: 97.9 F (36.6 C)   SpO2: 99% 93%   Filed Weights   05/11/18 2140 05/12/18 0245 05/13/18 0401  Weight: 185 lb (83.9 kg) 187 lb 8 oz (85 kg) 184 lb (83.5 kg)    GENERAL:alert, no distress and comfortable SKIN: skin color, texture, turgor are normal, no rashes or significant lesions EYES: normal, Conjunctiva are pink and non-injected, sclera clear OROPHARYNX:no exudate, no erythema and lips, buccal mucosa, and tongue normal  NECK: supple, thyroid normal size, non-tender, without nodularity LYMPH:  no palpable lymphadenopathy in the cervical, axillary or inguinal LUNGS: Diminished breath sounds left side  hEART: regular rate & rhythm  and no murmurs and no lower extremity edema ABDOMEN:abdomen soft, non-tender and normal bowel sounds Musculoskeletal:no cyanosis of digits and no clubbing  NEURO: alert & oriented x 3 with fluent speech, no focal motor/sensory deficits  LABORATORY DATA:  I have reviewed the data as listed CMP  Latest Ref Rng & Units 05/13/2018 05/12/2018 05/11/2018  Glucose 70 - 99 mg/dL 96 130(H) 117(H)  BUN 8 - 23 mg/dL '18 10 10  ' Creatinine 0.44 - 1.00 mg/dL 0.80 0.91 0.99  Sodium 135 - 145 mmol/L 140 141 143  Potassium 3.5 - 5.1 mmol/L 4.0 3.5 3.6  Chloride 98 - 111 mmol/L 104 103 104  CO2 22 - 32 mmol/L '28 24 28  ' Calcium 8.9 - 10.3 mg/dL 8.7(L) 9.2 9.3  Total Protein 6.5 - 8.1 g/dL - 6.9 -  Total Bilirubin 0.3 - 1.2 mg/dL - 0.6 -  Alkaline Phos 38 - 126 U/L - 57 -  AST 15 - 41 U/L - 13(L) -  ALT 0 - 44 U/L - 9 -    Lab Results  Component Value Date   WBC 6.5 05/13/2018   HGB 13.9 05/13/2018   HCT 42.6 05/13/2018   MCV 95.1 05/13/2018   PLT 270 05/13/2018   NEUTROABS 12.5 (H) 02/04/2017    ASSESSMENT AND PLAN: Left pleural effusion with left upper lobe consolidation and paratracheal and AP window lymphadenopathy. Status post thoracentesis, cytology is pending. I will follow the patient next week in my office with a video visit Patient can go home from medical oncology standpoint. If the cytology is positive for malignancy then she will need a whole-body PET CT scan.  Appt made for 05/19/18 at 8:45 AM Patient was instructed that it is a video visit and that she does not have to come to the cancer center. I will call her at the time of the visit and initiate the appointment.

## 2018-05-13 NOTE — Progress Notes (Signed)
PROGRESS NOTE    KYAH BUESING  BJS:[id number] DOB: 1955-05-13 DOA: 05/11/2018 PCP: [name], MD   Brief Narrative 63 year old with past medical history significant for left side breast cancer treated with neoadjuvant chemotherapy status post left mastectomy followed by radiation and Xeloda chemotherapy, COPD, hypertension, current smoker who presents complaining of worsening shortness of breath and cough. Patient was found to be hypoxic with oxygen saturation 95 on room air, she was placed on non-rebreather by EMS. CT chest with contrast showed dense consolidation airspace disease in the left upper lobe with a large partially loculated left pleural effusion and compressive atelectasis of the left lower lobe.   Assessment & Plan:   Principal Problem:   Pleural effusion on left Active Problems:   Breast cancer of upper-outer quadrant of left female breast ([facility])   Hypertension   COPD (chronic obstructive pulmonary disease) ([facility])   Anxiety and depression   1-left side pleural effusion, left upper lobe consolidation; Patient underwent thoracentesis yielding 1.5 L. CT chest showed loculated left lower effusion and left upper lobe consolidation I have started IV antibiotics to cover for possible pneumonia Follow cytology Pleural fluid; white blood cell 535, monocyte 34, no organisms seen.  Protein 4.7, DH pending, body fluid culture no growth to date.  LDH 160, Follow cytology Pleural effusion exudative by labs.   concern with malignant effusion. Discussed with pulmonologist await cytology , if negative will need Pulmonary consult for bronchoscopy and biopsy.   2-acute hypoxic respiratory failure secondary to left pleural effusion and left upper lobe consolidation Taper off of oxygen as possible. Continue with IV antibiotics for now, follow final culture results and cytology   3-HTN; continue with metoprolol.  Restart Norvasc blood pressure uncontrolled 4- History breast  cancer, Dr Lindi Adie will see patient in consultation today   Concern for left pleural effusion to be malignant 5-Anxiety; PRN xanax ordered.   Estimated body mass index is 28.39 kg/m as calculated from the following:   Height as of this encounter: 5' 7.5" (1.715 m).   Weight as of this encounter: 83.5 kg.   DVT prophylaxis:  Code Status: DNR Family Communication: Care discussed with patient Disposition Plan: Patient will remain in the hospital for further evaluation of pleural effusion, shortness of breath, she might require bronchoscopy if cytology is inconclusive.  Oncology will evaluate patient today as well   Consultants:   Dr. Sonny Dandy added to the rounding list   Procedures:   none   Antimicrobials:   Ceftriaxone  Azithromycin   Subjective: She is breathing better than yesterday but she still feels SOB Report cough. She is anxious with results of pleural fluid.   Objective: Vitals:   05/12/18 1945 05/12/18 2049 05/13/18 0401 05/13/18 0845  BP:  122/68 106/66 132/87  Pulse: 64 79 (!) 58 77  Resp:  (!) 22 17   Temp:  98.3 F (36.8 C) 97.9 F (36.6 C)   TempSrc:  Oral Oral   SpO2: 97% 97% 99% 93%  Weight:   83.5 kg   Height:        Intake/Output Summary (Last 24 hours) at 05/13/2018 0911 Last data filed at 05/12/2018 1800 Gross per 24 hour  Intake 894 ml  Output --  Net 894 ml   Filed Weights   05/11/18 2140 05/12/18 0245 05/13/18 0401  Weight: 83.9 kg 85 kg 83.5 kg    Examination:  General exam: Anxious Respiratory system: Decreased sounds on the left side Cardiovascular system: S1, S2 regular rhythm  and rate Gastrointestinal system: Sounds present, soft nontender nondistended Central nervous system: Nonfocal Extremities:Symmetric power    Data Reviewed: I have personally reviewed following labs and imaging studies  CBC: Recent Labs  Lab 05/11/18 2154 05/12/18 0353  WBC 6.1 5.5  HGB 14.3 14.5  HCT 44.6 43.8  MCV 96.1 94.2  PLT 302 027    Basic Metabolic Panel: Recent Labs  Lab 05/11/18 2154 05/12/18 0353  NA 143 141  K 3.6 3.5  CL 104 103  CO2 28 24  GLUCOSE 117* 130*  BUN 10 10  CREATININE 0.99 0.91  CALCIUM 9.3 9.2   GFR: Estimated Creatinine Clearance: 71.9 mL/min (by C-G formula based on SCr of 0.91 mg/dL). Liver Function Tests: Recent Labs  Lab 05/12/18 0353  AST 13*  ALT 9  ALKPHOS 57  BILITOT 0.6  PROT 6.9  ALBUMIN 3.7   No results for input(s): LIPASE, AMYLASE in the last 168 hours. No results for input(s): AMMONIA in the last 168 hours. Coagulation Profile: No results for input(s): INR, PROTIME in the last 168 hours. Cardiac Enzymes: Recent Labs  Lab 05/11/18 2154 05/12/18 0353  TROPONINI 0.03* 0.03*   BNP (last 3 results) No results for input(s): PROBNP in the last 8760 hours. HbA1C: No results for input(s): HGBA1C in the last 72 hours. CBG: No results for input(s): GLUCAP in the last 168 hours. Lipid Profile: No results for input(s): CHOL, HDL, LDLCALC, TRIG, CHOLHDL, LDLDIRECT in the last 72 hours. Thyroid Function Tests: Recent Labs    05/12/18 0353  TSH 1.000   Anemia Panel: No results for input(s): VITAMINB12, FOLATE, FERRITIN, TIBC, IRON, RETICCTPCT in the last 72 hours. Sepsis Labs: No results for input(s): PROCALCITON, LATICACIDVEN in the last 168 hours.  Recent Results (from the past 240 hour(s))  SARS Coronavirus 2 (CEPHEID- Performed in [place] hospital lab), Hosp Order     Status: None   Collection Time: 05/11/18 10:54 PM  Result Value Ref Range Status   SARS Coronavirus 2 NEGATIVE NEGATIVE Final    Comment: (NOTE) If result is NEGATIVE SARS-CoV-2 target nucleic acids are NOT DETECTED. The SARS-CoV-2 RNA is generally detectable in upper and lower  respiratory specimens during the acute phase of infection. The lowest  concentration of SARS-CoV-2 viral copies this assay can detect is 250  copies / mL. A negative result does not preclude SARS-CoV-2  infection  and should not be used as the sole basis for treatment or other  patient management decisions.  A negative result may occur with  improper specimen collection / handling, submission of specimen other  than nasopharyngeal swab, presence of viral mutation(s) within the  areas targeted by this assay, and inadequate number of viral copies  (<250 copies / mL). A negative result must be combined with clinical  observations, patient history, and epidemiological information. If result is POSITIVE SARS-CoV-2 target nucleic acids are DETECTED. The SARS-CoV-2 RNA is generally detectable in upper and lower  respiratory specimens dur ing the acute phase of infection.  Positive  results are indicative of active infection with SARS-CoV-2.  Clinical  correlation with patient history and other diagnostic information is  necessary to determine patient infection status.  Positive results do  not rule out bacterial infection or co-infection with other viruses. If result is PRESUMPTIVE POSTIVE SARS-CoV-2 nucleic acids MAY BE PRESENT.   A presumptive positive result was obtained on the submitted specimen  and confirmed on repeat testing.  While 2019 novel coronavirus  (SARS-CoV-2) nucleic acids may  be present in the submitted sample  additional confirmatory testing may be necessary for epidemiological  and / or clinical management purposes  to differentiate between  SARS-CoV-2 and other Sarbecovirus currently known to infect humans.  If clinically indicated additional testing with an alternate test  methodology [phone]) is advised. The SARS-CoV-2 RNA is generally  detectable in upper and lower respiratory sp ecimens during the acute  phase of infection. The expected result is Negative. Fact Sheet for Patients:  StrictlyIdeas.no Fact Sheet for Healthcare Providers: BankingDealers.co.za This test is not yet approved or cleared by the [place]  FDA and has been authorized for detection and/or diagnosis of SARS-CoV-2 by FDA under an Emergency Use Authorization (EUA).  This EUA will remain in effect (meaning this test can be used) for the duration of the COVID-19 declaration under Section 564(b)(1) of the Act, 21 U.S.C. section 360bbb-3(b)(1), unless the authorization is terminated or revoked sooner. Performed at [facility], [place] [address], [place], [place] [address]   Gram stain     Status: None   Collection Time: 05/12/18 12:49 PM  Result Value Ref Range Status   Specimen Description PLEURAL  Final   Special Requests NONE  Final   Gram Stain   Final    WBC PRESENT, PREDOMINANTLY MONONUCLEAR NO ORGANISMS SEEN CYTOSPIN SMEAR Performed at [facility], [place] [address] [place], [place] [address]    Report Status 05/12/2018 FINAL  Final  Culture, body fluid-bottle     Status: None (Preliminary result)   Collection Time: 05/12/18 12:50 PM  Result Value Ref Range Status   Specimen Description PLEURAL  Final   Special Requests NONE  Final   Culture   Final    NO GROWTH < 24 HOURS Performed at [facility], [place] [address], [place], [place] [address]    Report Status PENDING  Incomplete         Radiology Studies: Dg Chest 1 View  Result Date: 05/12/2018 CLINICAL DATA:  Status post left thoracentesis. EXAM: CHEST  1 VIEW COMPARISON:  CT chest from same day.  Chest x-ray from yesterday. FINDINGS: Stable cardiomediastinal silhouette with silhouetting of the left heart border. Interval decrease in size of now moderate loculated left pleural effusion. Dense consolidation in the left upper lobe as seen on earlier chest CT. Mildly improved aeration of the left lung. The right lung remains clear. No pneumothorax. No acute osseous abnormality. IMPRESSION: 1. Decrease in size of now moderate loculated left pleural effusion with mildly improved left lung aeration. No pneumothorax. 2. Persistent dense consolidation in  the left upper lobe. Electronically Signed   By: Titus Dubin M.D.   On: 05/12/2018 13:23   Ct Chest W Contrast  Result Date: 05/12/2018 CLINICAL DATA:  Left side chest pain, shortness of breath EXAM: CT CHEST WITH CONTRAST TECHNIQUE: Multidetector CT imaging of the chest was performed during intravenous contrast administration. CONTRAST:  52mL OMNIPAQUE IOHEXOL 300 MG/ML  SOLN COMPARISON:  Chest x-ray 05/11/2018 FINDINGS: Cardiovascular: Heart is normal size. Scattered aortic calcifications. Aorta is normal size. Mediastinum/Nodes: Enlarged mediastinal lymph nodes. Right paratracheal lymph node has a short axis diameter of 1.7 cm on image 56. Prevascular/AP window lymph nodal mass has a short axis diameter of 1.7 cm. No axillary adenopathy or right hilar adenopathy. Difficult to assess the left hilum due to airspace disease and fluid. Lungs/Pleura: Near complete opacification of the left hemithorax due to large partially loculated left effusion and airspace disease. Dense consolidation  in the left upper lobe. Compressive atelectasis in the left lower lobe. No effusion on the right. Ground-glass nodule posteriorly in the right lower lobe measures 11 mm. Upper Abdomen: Imaging into the upper abdomen shows no acute findings. Musculoskeletal: Prior left mastectomy.  No acute bony abnormality. IMPRESSION: Dense consolidative airspace disease in the left upper lobe with large partially loculated left pleural effusion and compressive atelectasis in the left lower lobe. This could be related to pneumonia. A postobstructive process cannot be excluded. Mediastinal adenopathy. Prior left mastectomy. Electronically Signed   By: Rolm Baptise M.D.   On: 05/12/2018 01:25   Dg Chest Port 1 View  Result Date: 05/11/2018 CLINICAL DATA:  Shortness of breath EXAM: PORTABLE CHEST 1 VIEW COMPARISON:  02/04/2017 FINDINGS: There is near complete opacification of the left hemithorax. There is no significant shift of the  mediastinum to the right. The cardiac silhouette is suboptimally evaluated secondary to the appearance of the left hemithorax. No definite pneumothorax. The right lung field appears essentially clear. Surgical clips are noted in the left axilla. IMPRESSION: Near complete opacification of the left hemithorax which may be secondary to a large left-sided pleural effusion and/or confluent airspace disease. An underlying mass cannot be excluded. Electronically Signed   By: Constance Holster M.D.   On: 05/11/2018 21:57   Ir Thoracentesis Asp Pleural Space W/img Guide  Result Date: 05/12/2018 INDICATION: Patient with history of left-sided breast cancer, COPD, dyspnea, and left pleural effusion. Request is made for diagnostic and therapeutic left thoracentesis. EXAM: ULTRASOUND GUIDED DIAGNOSTIC AND THERAPEUTIC THORACENTESIS MEDICATIONS: 10 mL 1% lidocaine COMPLICATIONS: None immediate. PROCEDURE: An ultrasound guided thoracentesis was thoroughly discussed with the patient and questions answered. The benefits, risks, alternatives and complications were also discussed. The patient understands and wishes to proceed with the procedure. Written consent was obtained. Ultrasound was performed to localize and mark an adequate pocket of fluid in the left chest. The area was then prepped and draped in the normal sterile fashion. 1% Lidocaine was used for local anesthesia. Under ultrasound guidance a 6 Fr Safe-T-Centesis catheter was introduced. Thoracentesis was performed. The catheter was removed and a dressing applied. FINDINGS: A total of approximately 1.55 L of hazy gold fluid was removed. Procedure was stopped after 1.55 L due to patient coughing/discomfort. Samples were sent to the laboratory as requested by the clinical team. IMPRESSION: Successful ultrasound guided left thoracentesis yielding 1.55 L of pleural fluid. Read by: Earley Abide, PA-C Electronically Signed   By: Jerilynn Mages.  Shick M.D.   On: 05/12/2018 13:35         Scheduled Meds:  amLODipine  5 mg Oral Daily   gabapentin  300 mg Oral TID   metoprolol tartrate  25 mg Oral BID   PARoxetine  20 mg Oral BID   sodium chloride flush  3 mL Intravenous Q12H   Continuous Infusions:  azithromycin 500 mg (05/12/18 1357)   cefTRIAXone (ROCEPHIN)  IV 1 g (05/13/18 0850)     LOS: 1 day    Time spent: 35 minutes    Elmarie Shiley, MD [facility] Pager [phone]  If 7PM-7AM, please contact night-coverage [email or web address] Password [email or web address] 05/13/2018, 9:11 AM

## 2018-05-14 MED ORDER — GUAIFENESIN-DM 100-10 MG/5ML PO SYRP
5.0000 mL | ORAL_SOLUTION | ORAL | Status: DC | PRN
  Administered 2018-05-14 – 2018-05-15 (×4): 5 mL via ORAL
  Filled 2018-05-14 (×4): qty 5

## 2018-05-14 NOTE — Progress Notes (Signed)
PROGRESS NOTE    Deborah Mcintyre  WUJ:[id number] DOB: July 16, 1955 DOA: 05/11/2018 PCP: [name], MD   Brief Narrative 63 year old with past medical history significant for left side breast cancer treated with neoadjuvant chemotherapy status post left mastectomy followed by radiation and Xeloda chemotherapy, COPD, hypertension, current smoker who presents complaining of worsening shortness of breath and cough. Patient was found to be hypoxic with oxygen saturation 95 on room air, she was placed on non-rebreather by EMS. CT chest with contrast showed dense consolidation airspace disease in the left upper lobe with a large partially loculated left pleural effusion and compressive atelectasis of the left lower lobe.   Assessment & Plan:   Principal Problem:   Pleural effusion on left Active Problems:   Breast cancer of upper-outer quadrant of left female breast (HCC)   Hypertension   COPD (chronic obstructive pulmonary disease) (HCC)   Anxiety and depression   1-left side pleural effusion, left upper lobe consolidation; Patient underwent thoracentesis yielding 1.5 L. CT chest showed loculated left lower effusion and left upper lobe consolidation I have started IV antibiotics to cover for possible pneumonia Follow cytology Pleural fluid; white blood cell 535, monocyte 34, no organisms seen.  Protein 4.7, DH pending, body fluid culture no growth to date.  LDH 160, Follow cytology Pleural effusion exudative by labs.   Preliminary report from cytology concerning with adenocarcinoma.  Patient SOB today, back on oxygen. Will repeat chest x ray in am.   2-acute hypoxic respiratory failure secondary to left pleural effusion and left upper lobe consolidation Taper off of oxygen as possible. Continue with IV antibiotics for now, follow final culture results and cytology  Home oxygen evaluation.   3-HTN; continue with metoprolol.  Restart Norvasc blood pressure uncontrolled 4- History  breast cancer, Dr Lindi Adie will see patient in consultation today   Concern for left pleural effusion to be malignant 5-Anxiety; PRN xanax ordered.   Estimated body mass index is 28.69 kg/m as calculated from the following:   Height as of this encounter: 5' 7.5" (1.715 m).   Weight as of this encounter: 84.3 kg.   DVT prophylaxis:  Code Status: DNR Family Communication: Care discussed with patient Disposition Plan: Patient is more SOB today, requiring oxygen supplementation again. Will plan to repeat chest x ray in am. If recurrence of pleural effusion will ask IR for thoracentesis,.    Consultants:   Dr. Sonny Dandy added to the rounding list   Procedures:   none   Antimicrobials:   Ceftriaxone  Azithromycin   Subjective: She is more  SOB today. Back on oxygen, feels fluid is coming back.  Objective: Vitals:   05/14/18 0034 05/14/18 0543 05/14/18 0827 05/14/18 1418  BP: 124/86 (!) 133/99 124/78 117/75  Pulse: 64 79 83 73  Resp: (!) 25 19    Temp: 97.6 F (36.4 C) 97.7 F (36.5 C)  98.6 F (37 C)  TempSrc: Oral Oral  Oral  SpO2:  93%  99%  Weight:  84.3 kg    Height:        Intake/Output Summary (Last 24 hours) at 05/14/2018 1532 Last data filed at 05/14/2018 0830 Gross per 24 hour  Intake 818 ml  Output 600 ml  Net 218 ml   Filed Weights   05/12/18 0245 05/13/18 0401 05/14/18 0543  Weight: 85 kg 83.5 kg 84.3 kg    Examination:  General exam, flat affect.  Respiratory system: mild tachypnea, decrease breath sound left  Cardiovascular system: S 1,.  S 2 RRR Gastrointestinal system: BS present, soft, nt Central nervous system: non focal.  Extremities: symmetric powe.r     Data Reviewed: I have personally reviewed following labs and imaging studies  CBC: Recent Labs  Lab 05/11/18 2154 05/12/18 0353 05/13/18 0940  WBC 6.1 5.5 6.5  HGB 14.3 14.5 13.9  HCT 44.6 43.8 42.6  MCV 96.1 94.2 95.1  PLT 302 295 938   Basic Metabolic Panel: Recent Labs   Lab 05/11/18 2154 05/12/18 0353 05/13/18 0940  NA 143 141 140  K 3.6 3.5 4.0  CL 104 103 104  CO2 28 24 28   GLUCOSE 117* 130* 96  BUN 10 10 18   CREATININE 0.99 0.91 0.80  CALCIUM 9.3 9.2 8.7*   GFR: Estimated Creatinine Clearance: 82.2 mL/min (by C-G formula based on SCr of 0.8 mg/dL). Liver Function Tests: Recent Labs  Lab 05/12/18 0353  AST 13*  ALT 9  ALKPHOS 57  BILITOT 0.6  PROT 6.9  ALBUMIN 3.7   No results for input(s): LIPASE, AMYLASE in the last 168 hours. No results for input(s): AMMONIA in the last 168 hours. Coagulation Profile: No results for input(s): INR, PROTIME in the last 168 hours. Cardiac Enzymes: Recent Labs  Lab 05/11/18 2154 05/12/18 0353  TROPONINI 0.03* 0.03*   BNP (last 3 results) No results for input(s): PROBNP in the last 8760 hours. HbA1C: No results for input(s): HGBA1C in the last 72 hours. CBG: No results for input(s): GLUCAP in the last 168 hours. Lipid Profile: No results for input(s): CHOL, HDL, LDLCALC, TRIG, CHOLHDL, LDLDIRECT in the last 72 hours. Thyroid Function Tests: Recent Labs    05/12/18 0353  TSH 1.000   Anemia Panel: No results for input(s): VITAMINB12, FOLATE, FERRITIN, TIBC, IRON, RETICCTPCT in the last 72 hours. Sepsis Labs: No results for input(s): PROCALCITON, LATICACIDVEN in the last 168 hours.  Recent Results (from the past 240 hour(s))  SARS Coronavirus 2 (CEPHEID- Performed in [place] hospital lab), Hosp Order     Status: None   Collection Time: 05/11/18 10:54 PM  Result Value Ref Range Status   SARS Coronavirus 2 NEGATIVE NEGATIVE Final    Comment: (NOTE) If result is NEGATIVE SARS-CoV-2 target nucleic acids are NOT DETECTED. The SARS-CoV-2 RNA is generally detectable in upper and lower  respiratory specimens during the acute phase of infection. The lowest  concentration of SARS-CoV-2 viral copies this assay can detect is 250  copies / mL. A negative result does not preclude SARS-CoV-2  infection  and should not be used as the sole basis for treatment or other  patient management decisions.  A negative result may occur with  improper specimen collection / handling, submission of specimen other  than nasopharyngeal swab, presence of viral mutation(s) within the  areas targeted by this assay, and inadequate number of viral copies  (<250 copies / mL). A negative result must be combined with clinical  observations, patient history, and epidemiological information. If result is POSITIVE SARS-CoV-2 target nucleic acids are DETECTED. The SARS-CoV-2 RNA is generally detectable in upper and lower  respiratory specimens dur ing the acute phase of infection.  Positive  results are indicative of active infection with SARS-CoV-2.  Clinical  correlation with patient history and other diagnostic information is  necessary to determine patient infection status.  Positive results do  not rule out bacterial infection or co-infection with other viruses. If result is PRESUMPTIVE POSTIVE SARS-CoV-2 nucleic acids MAY BE PRESENT.   A presumptive positive result was  obtained on the submitted specimen  and confirmed on repeat testing.  While 2019 novel coronavirus  (SARS-CoV-2) nucleic acids may be present in the submitted sample  additional confirmatory testing may be necessary for epidemiological  and / or clinical management purposes  to differentiate between  SARS-CoV-2 and other Sarbecovirus currently known to infect humans.  If clinically indicated additional testing with an alternate test  methodology [phone]) is advised. The SARS-CoV-2 RNA is generally  detectable in upper and lower respiratory sp ecimens during the acute  phase of infection. The expected result is Negative. Fact Sheet for Patients:  StrictlyIdeas.no Fact Sheet for Healthcare Providers: BankingDealers.co.za This test is not yet approved or cleared by the [place]  FDA and has been authorized for detection and/or diagnosis of SARS-CoV-2 by FDA under an Emergency Use Authorization (EUA).  This EUA will remain in effect (meaning this test can be used) for the duration of the COVID-19 declaration under Section 564(b)(1) of the Act, 21 U.S.C. section 360bbb-3(b)(1), unless the authorization is terminated or revoked sooner. Performed at [facility], [place] [address] [place], [place] [address]   Gram stain     Status: None   Collection Time: 05/12/18 12:49 PM  Result Value Ref Range Status   Specimen Description PLEURAL  Final   Special Requests NONE  Final   Gram Stain   Final    WBC PRESENT, PREDOMINANTLY MONONUCLEAR NO ORGANISMS SEEN CYTOSPIN SMEAR Performed at [facility], [place] [address], [place], [place] [address]    Report Status 05/12/2018 FINAL  Final  Culture, body fluid-bottle     Status: None (Preliminary result)   Collection Time: 05/12/18 12:50 PM  Result Value Ref Range Status   Specimen Description PLEURAL  Final   Special Requests NONE  Final   Culture   Final    NO GROWTH 1 DAY Performed at [facility], [place] [address] [place], [place] [address]    Report Status PENDING  Incomplete         Radiology Studies: No results found.      Scheduled Meds: . amLODipine  5 mg Oral Daily  . gabapentin  300 mg Oral TID  . metoprolol tartrate  25 mg Oral BID  . PARoxetine  20 mg Oral BID  . sodium chloride flush  3 mL Intravenous Q12H   Continuous Infusions: . azithromycin 500 mg (05/14/18 1050)  . cefTRIAXone (ROCEPHIN)  IV 1 g (05/14/18 0822)     LOS: 2 days    Time spent: 35 minutes    Elmarie Shiley, MD [facility] Pager [phone]  If 7PM-7AM, please contact night-coverage [email or web address] Password [facility] 05/14/2018, 3:32 PM

## 2018-05-15 ENCOUNTER — Inpatient Hospital Stay (HOSPITAL_COMMUNITY): Payer: Medicare Other

## 2018-05-15 MED ORDER — GUAIFENESIN-DM 100-10 MG/5ML PO SYRP: 5.0000 mL | ORAL_SOLUTION | ORAL | 0 refills | Status: DC | PRN

## 2018-05-15 MED ORDER — ALBUTEROL SULFATE (2.5 MG/3ML) 0.083% IN NEBU: 2.5000 mg | INHALATION_SOLUTION | Freq: Four times a day (QID) | RESPIRATORY_TRACT | 0 refills | Status: DC | PRN

## 2018-05-15 MED ORDER — AMOXICILLIN-POT CLAVULANATE 875-125 MG PO TABS: 1.0000 | ORAL_TABLET | Freq: Two times a day (BID) | ORAL | 0 refills | Status: AC

## 2018-05-15 MED ORDER — PAROXETINE HCL 20 MG PO TABS: 20.0000 mg | ORAL_TABLET | Freq: Two times a day (BID) | ORAL | 0 refills | Status: DC

## 2018-05-15 MED ORDER — ALPRAZOLAM 0.5 MG PO TABS: 0.5000 mg | ORAL_TABLET | Freq: Three times a day (TID) | ORAL | 0 refills | Status: DC | PRN

## 2018-05-15 MED ORDER — AZITHROMYCIN 250 MG PO TABS
500.0000 mg | ORAL_TABLET | Freq: Every day | ORAL | Status: DC
  Administered 2018-05-15: 11:00:00 500 mg via ORAL
  Filled 2018-05-15: qty 2

## 2018-05-15 MED ORDER — AMLODIPINE BESYLATE 5 MG PO TABS: 5.0000 mg | ORAL_TABLET | Freq: Every day | ORAL | 0 refills | Status: DC

## 2018-05-15 NOTE — TOC Transition Note (Signed)
Transition of Care [facility]) - CM/SW Discharge Note   Patient Details  Name: Deborah Mcintyre MRN: [id number] Date of Birth: 08-23-1955  Transition of [facility]) CM/SW Contact:  Claudie Leach, RN Phone Number: 05/15/2018, 2:28 PM   Clinical Narrative:    Pt to d/c home with family and DME O2 today.  Patient asked to speak to CM regarding housing needs.  Pt in section 8 housing with daughter and grandchildren and wants to move because "it is too crowded".  Advised patient to contact section 8/DSS Education officer, museum.    Final next level of care: Home/Self Care Barriers to Discharge: No Barriers Identified   Patient Goals and CMS Choice Patient states their goals for this hospitalization and ongoing recovery are:: to go home    Discharge Plan and Services  DME Arranged: Oxygen DME Agency: [employer] Date DME Agency Contacted: 05/15/18 Time DME Agency Contacted: [phone] Representative spoke with at DME Agency: Sunday Corn

## 2018-05-15 NOTE — Discharge Summary (Addendum)
Physician Discharge Summary  ALIANIS TRIMMER FGH:[id number] DOB: 1955/06/22 DOA: 05/11/2018  PCP: [name], MD  Admit date: 05/11/2018 Discharge date: 05/15/2018  Admitted From: Home  Disposition:  Home   Recommendations for Outpatient Follow-up:  1. Follow up with PCP in 1-2 weeks 2. Please obtain BMP/CBC in one week 3. Please follow up on the following pending results: follow final report of cytology.  4. Follow up with Dr Lindi Adie for further treatment of pleural effusion and lung mass.  5. Needs chest xray in 1 week to follow pleural effusion.    Equipment/Devices: yes. Oxygen.   Discharge Condition: stable.  CODE STATUS: DNR Diet recommendation: Heart Healthy  Brief/Interim Summary: 63 year old with past medical history significant for left side breast cancer treated with neoadjuvant chemotherapy status post left mastectomy followed by radiation and Xeloda chemotherapy, COPD, hypertension, current smoker who presents complaining of worsening shortness of breath and cough. Patient was found to be hypoxic with oxygen saturation 95 on room air, she was placed on non-rebreather by EMS. CT chest with contrast showed dense consolidation airspace disease in the left upper lobe with a large partially loculated left pleural effusion and compressive atelectasis of the left lower lobe.  1-left side pleural effusion, left upper lobe consolidation; Left side pleural effusion malignancy, mets from Breast,.  Patient underwent thoracentesis yielding 1.5 L. CT chest showed loculated left lower effusion and left upper lobe consolidation I have started IV antibiotics to cover for possible pneumonia Follow cytology Pleural fluid; white blood cell 535, monocyte 34, no organisms seen.  Protein 4.7, DH pending, body fluid culture no growth to date.  LDH 160, Follow cytology Pleural effusion exudative by labs.   Preliminary report from cytology concerning with adenocarcinoma.  Repeated chest x  ray similar than last x ray post thoracentesis. Needs close follow up. She will required further thoracentesis   2-acute hypoxic respiratory failure secondary to left pleural effusion and left upper lobe consolidation Taper off of oxygen as possible. Treated with antibiotics, discharge on 3 days of antibiotics.  Likely malignancy, cytology preliminary report consistent with malignancy.  Patient will need home oxygen 3 L.   3-HTN; continue with metoprolol.  Restarted  Norvasc blood pressure uncontrolled  4- History breast cancer, Dr Lindi Adie will see patient in consultation today   Concern for left pleural effusion to be malignant Dr Lindi Adie will follow final cytology results. Patient has appointment early next week.   5-Anxiety; PRN xanax ordered. will provide refill, patient is anxious with new possible diagnosis.    Discharge Diagnoses:  Principal Problem:   Pleural effusion on left Active Problems:   Breast cancer of upper-outer quadrant of left female breast (HCC)   Hypertension   COPD (chronic obstructive pulmonary disease) (HCC)   Anxiety and depression    Discharge Instructions  Discharge Instructions    Diet - low sodium heart healthy   Complete by:  As directed    Increase activity slowly   Complete by:  As directed      Allergies as of 05/15/2018   No Known Allergies     Medication List    TAKE these medications   albuterol 108 (90 Base) MCG/ACT inhaler Commonly known as:  VENTOLIN HFA Inhale 1-2 puffs into the lungs every 6 (six) hours as needed for wheezing or shortness of breath.   albuterol (2.5 MG/3ML) 0.083% nebulizer solution Commonly known as:  PROVENTIL Take 3 mLs (2.5 mg total) by nebulization every 6 (six) hours as needed for wheezing  or shortness of breath.   ALPRAZolam 0.5 MG tablet Commonly known as:  XANAX Take 1 tablet (0.5 mg total) by mouth 3 (three) times daily as needed for anxiety or sleep.   amLODipine 5 MG tablet Commonly known  as:  NORVASC Take 1 tablet (5 mg total) by mouth daily. Start taking on:  May 16, 2018   amoxicillin-clavulanate 875-125 MG tablet Commonly known as:  Augmentin Take 1 tablet by mouth 2 (two) times daily for 3 days.   ferrous sulfate 325 (65 FE) MG tablet Take 1 tablet (325 mg total) by mouth daily with breakfast.   gabapentin 300 MG capsule Commonly known as:  NEURONTIN Take 1 capsule (300 mg total) by mouth 3 (three) times daily.   guaiFENesin-dextromethorphan 100-10 MG/5ML syrup Commonly known as:  ROBITUSSIN DM Take 5 mLs by mouth every 4 (four) hours as needed for cough.   metoprolol tartrate 50 MG tablet Commonly known as:  LOPRESSOR Take 0.5 tablets (25 mg total) by mouth 2 (two) times daily.   ondansetron 8 MG tablet Commonly known as:  ZOFRAN Take 1 tablet (8 mg total) by mouth every 8 (eight) hours as needed for nausea.   oxyCODONE-acetaminophen 5-325 MG tablet Commonly known as:  PERCOCET/ROXICET Take 1 tablet by mouth every 8 (eight) hours as needed for moderate pain or severe pain (1 tab every 4-6 hour prn pain).   PARoxetine 20 MG tablet Commonly known as:  PAXIL Take 1 tablet (20 mg total) by mouth 2 (two) times daily.   Premarin vaginal cream Generic drug:  conjugated estrogens INSERT 1 APPLICATORFUL VAGINALLY DAILY What changed:  See the new instructions.            Durable Medical Equipment  (From admission, onward)         Start     Ordered   05/15/18 1306  For home use only DME oxygen  Once    Question Answer Comment  Mode or (Route) Nasal cannula   Liters per Minute 3   Frequency Continuous (stationary and portable oxygen unit needed)   Oxygen conserving device Yes   Oxygen delivery system Gas      05/15/18 1305          No Known Allergies  Consultations: Dr Lindi Adie  Procedures/Studies: Dg Chest 1 View  Result Date: 05/12/2018 CLINICAL DATA:  Status post left thoracentesis. EXAM: CHEST  1 VIEW COMPARISON:  CT chest from same  day.  Chest x-ray from yesterday. FINDINGS: Stable cardiomediastinal silhouette with silhouetting of the left heart border. Interval decrease in size of now moderate loculated left pleural effusion. Dense consolidation in the left upper lobe as seen on earlier chest CT. Mildly improved aeration of the left lung. The right lung remains clear. No pneumothorax. No acute osseous abnormality. IMPRESSION: 1. Decrease in size of now moderate loculated left pleural effusion with mildly improved left lung aeration. No pneumothorax. 2. Persistent dense consolidation in the left upper lobe. Electronically Signed   By: Titus Dubin M.D.   On: 05/12/2018 13:23   Dg Chest 2 View  Result Date: 05/15/2018 CLINICAL DATA:  SOB (shortness of breath) EXAM: CHEST - 2 VIEW COMPARISON:  Radiograph 05/12/2018 FINDINGS: Normal cardiac silhouette. Opacification of the LEFT hemithorax with volume loss again noted. There is some improved aeration to the LEFT lung compared to prior. Large effusion noted. RIGHT lung clear. No pneumothorax. IMPRESSION: 1. Some improved aeration to the LEFT lung. Persistent dense opacification of the entire LEFT hemithorax with  volume loss and effusion. 2. RIGHT lung clear. Electronically Signed   By: Suzy Bouchard M.D.   On: 05/15/2018 06:05   Ct Chest W Contrast  Result Date: 05/12/2018 CLINICAL DATA:  Left side chest pain, shortness of breath EXAM: CT CHEST WITH CONTRAST TECHNIQUE: Multidetector CT imaging of the chest was performed during intravenous contrast administration. CONTRAST:  30mL OMNIPAQUE IOHEXOL 300 MG/ML  SOLN COMPARISON:  Chest x-ray 05/11/2018 FINDINGS: Cardiovascular: Heart is normal size. Scattered aortic calcifications. Aorta is normal size. Mediastinum/Nodes: Enlarged mediastinal lymph nodes. Right paratracheal lymph node has a short axis diameter of 1.7 cm on image 56. Prevascular/AP window lymph nodal mass has a short axis diameter of 1.7 cm. No axillary adenopathy or right  hilar adenopathy. Difficult to assess the left hilum due to airspace disease and fluid. Lungs/Pleura: Near complete opacification of the left hemithorax due to large partially loculated left effusion and airspace disease. Dense consolidation in the left upper lobe. Compressive atelectasis in the left lower lobe. No effusion on the right. Ground-glass nodule posteriorly in the right lower lobe measures 11 mm. Upper Abdomen: Imaging into the upper abdomen shows no acute findings. Musculoskeletal: Prior left mastectomy.  No acute bony abnormality. IMPRESSION: Dense consolidative airspace disease in the left upper lobe with large partially loculated left pleural effusion and compressive atelectasis in the left lower lobe. This could be related to pneumonia. A postobstructive process cannot be excluded. Mediastinal adenopathy. Prior left mastectomy. Electronically Signed   By: Rolm Baptise M.D.   On: 05/12/2018 01:25   Dg Chest Port 1 View  Result Date: 05/11/2018 CLINICAL DATA:  Shortness of breath EXAM: PORTABLE CHEST 1 VIEW COMPARISON:  02/04/2017 FINDINGS: There is near complete opacification of the left hemithorax. There is no significant shift of the mediastinum to the right. The cardiac silhouette is suboptimally evaluated secondary to the appearance of the left hemithorax. No definite pneumothorax. The right lung field appears essentially clear. Surgical clips are noted in the left axilla. IMPRESSION: Near complete opacification of the left hemithorax which may be secondary to a large left-sided pleural effusion and/or confluent airspace disease. An underlying mass cannot be excluded. Electronically Signed   By: Constance Holster M.D.   On: 05/11/2018 21:57   Ir Thoracentesis Asp Pleural Space W/img Guide  Result Date: 05/12/2018 INDICATION: Patient with history of left-sided breast cancer, COPD, dyspnea, and left pleural effusion. Request is made for diagnostic and therapeutic left thoracentesis. EXAM:  ULTRASOUND GUIDED DIAGNOSTIC AND THERAPEUTIC THORACENTESIS MEDICATIONS: 10 mL 1% lidocaine COMPLICATIONS: None immediate. PROCEDURE: An ultrasound guided thoracentesis was thoroughly discussed with the patient and questions answered. The benefits, risks, alternatives and complications were also discussed. The patient understands and wishes to proceed with the procedure. Written consent was obtained. Ultrasound was performed to localize and mark an adequate pocket of fluid in the left chest. The area was then prepped and draped in the normal sterile fashion. 1% Lidocaine was used for local anesthesia. Under ultrasound guidance a 6 Fr Safe-T-Centesis catheter was introduced. Thoracentesis was performed. The catheter was removed and a dressing applied. FINDINGS: A total of approximately 1.55 L of hazy gold fluid was removed. Procedure was stopped after 1.55 L due to patient coughing/discomfort. Samples were sent to the laboratory as requested by the clinical team. IMPRESSION: Successful ultrasound guided left thoracentesis yielding 1.55 L of pleural fluid. Read by: Earley Abide, PA-C Electronically Signed   By: Jerilynn Mages.  Shick M.D.   On: 05/12/2018 13:35  Subjective: She report breathing ok, from 1--10 ( be worse )  she rate it a 3.   Discharge Exam: Vitals:   05/15/18 0554 05/15/18 0830  BP: (!) 141/78 (!) 131/98  Pulse: 84 80  Resp: 18   Temp: 98.4 F (36.9 C)   SpO2: 94%      General: Pt is alert, awake, not in acute distress Cardiovascular: RRR, S1/S2 +, no rubs, no gallops Respiratory: CTA bilaterally, no wheezing, no rhonchi Abdominal: Soft, NT, ND, bowel sounds + Extremities: no edema, no cyanosis    The results of significant diagnostics from this hospitalization (including imaging, microbiology, ancillary and laboratory) are listed below for reference.     Microbiology: Recent Results (from the past 240 hour(s))  SARS Coronavirus 2 (CEPHEID- Performed in [place] hospital  lab), Hosp Order     Status: None   Collection Time: 05/11/18 10:54 PM  Result Value Ref Range Status   SARS Coronavirus 2 NEGATIVE NEGATIVE Final    Comment: (NOTE) If result is NEGATIVE SARS-CoV-2 target nucleic acids are NOT DETECTED. The SARS-CoV-2 RNA is generally detectable in upper and lower  respiratory specimens during the acute phase of infection. The lowest  concentration of SARS-CoV-2 viral copies this assay can detect is 250  copies / mL. A negative result does not preclude SARS-CoV-2 infection  and should not be used as the sole basis for treatment or other  patient management decisions.  A negative result may occur with  improper specimen collection / handling, submission of specimen other  than nasopharyngeal swab, presence of viral mutation(s) within the  areas targeted by this assay, and inadequate number of viral copies  (<250 copies / mL). A negative result must be combined with clinical  observations, patient history, and epidemiological information. If result is POSITIVE SARS-CoV-2 target nucleic acids are DETECTED. The SARS-CoV-2 RNA is generally detectable in upper and lower  respiratory specimens dur ing the acute phase of infection.  Positive  results are indicative of active infection with SARS-CoV-2.  Clinical  correlation with patient history and other diagnostic information is  necessary to determine patient infection status.  Positive results do  not rule out bacterial infection or co-infection with other viruses. If result is PRESUMPTIVE POSTIVE SARS-CoV-2 nucleic acids MAY BE PRESENT.   A presumptive positive result was obtained on the submitted specimen  and confirmed on repeat testing.  While 2019 novel coronavirus  (SARS-CoV-2) nucleic acids may be present in the submitted sample  additional confirmatory testing may be necessary for epidemiological  and / or clinical management purposes  to differentiate between  SARS-CoV-2 and other Sarbecovirus  currently known to infect humans.  If clinically indicated additional testing with an alternate test  methodology [phone]) is advised. The SARS-CoV-2 RNA is generally  detectable in upper and lower respiratory sp ecimens during the acute  phase of infection. The expected result is Negative. Fact Sheet for Patients:  StrictlyIdeas.no Fact Sheet for Healthcare Providers: BankingDealers.co.za This test is not yet approved or cleared by the [place] FDA and has been authorized for detection and/or diagnosis of SARS-CoV-2 by FDA under an Emergency Use Authorization (EUA).  This EUA will remain in effect (meaning this test can be used) for the duration of the COVID-19 declaration under Section 564(b)(1) of the Act, 21 U.S.C. section 360bbb-3(b)(1), unless the authorization is terminated or revoked sooner. Performed at [facility], [place] [address], [place], [place] [address]   Gram stain  Status: None   Collection Time: 05/12/18 12:49 PM  Result Value Ref Range Status   Specimen Description PLEURAL  Final   Special Requests NONE  Final   Gram Stain   Final    WBC PRESENT, PREDOMINANTLY MONONUCLEAR NO ORGANISMS SEEN CYTOSPIN SMEAR Performed at [facility], [address], [place], [place] [address]    Report Status 05/12/2018 FINAL  Final  Culture, body fluid-bottle     Status: None (Preliminary result)   Collection Time: 05/12/18 12:50 PM  Result Value Ref Range Status   Specimen Description PLEURAL  Final   Special Requests NONE  Final   Culture   Final    NO GROWTH 3 DAYS Performed at [place] [address] [place], [place] [address]    Report Status PENDING  Incomplete     Labs: BNP (last 3 results) No results for input(s): BNP in the last 8760 hours. Basic Metabolic Panel: Recent Labs  Lab 05/11/18 2154 05/12/18 0353 05/13/18 0940  NA 143 141 140  K 3.6 3.5 4.0  CL 104 103 104  CO2 28  24 28   GLUCOSE 117* 130* 96  BUN 10 10 18   CREATININE 0.99 0.91 0.80  CALCIUM 9.3 9.2 8.7*   Liver Function Tests: Recent Labs  Lab 05/12/18 0353  AST 13*  ALT 9  ALKPHOS 57  BILITOT 0.6  PROT 6.9  ALBUMIN 3.7   No results for input(s): LIPASE, AMYLASE in the last 168 hours. No results for input(s): AMMONIA in the last 168 hours. CBC: Recent Labs  Lab 05/11/18 2154 05/12/18 0353 05/13/18 0940  WBC 6.1 5.5 6.5  HGB 14.3 14.5 13.9  HCT 44.6 43.8 42.6  MCV 96.1 94.2 95.1  PLT 302 295 270   Cardiac Enzymes: Recent Labs  Lab 05/11/18 2154 05/12/18 0353  TROPONINI 0.03* 0.03*   BNP: Invalid input(s): POCBNP CBG: No results for input(s): GLUCAP in the last 168 hours. D-Dimer No results for input(s): DDIMER in the last 72 hours. Hgb A1c No results for input(s): HGBA1C in the last 72 hours. Lipid Profile No results for input(s): CHOL, HDL, LDLCALC, TRIG, CHOLHDL, LDLDIRECT in the last 72 hours. Thyroid function studies No results for input(s): TSH, T4TOTAL, T3FREE, THYROIDAB in the last 72 hours.  Invalid input(s): FREET3 Anemia work up No results for input(s): VITAMINB12, FOLATE, FERRITIN, TIBC, IRON, RETICCTPCT in the last 72 hours. Urinalysis    Component Value Date/Time   COLORURINE YELLOW 12/06/2014 2207   APPEARANCEUR CLOUDY (A) 12/06/2014 2207   LABSPEC 1.025 12/06/2014 2207   PHURINE 5.5 12/06/2014 2207   GLUCOSEU NEGATIVE 12/06/2014 2207   HGBUR MODERATE (A) 12/06/2014 2207   BILIRUBINUR NEGATIVE 12/06/2014 2207   KETONESUR NEGATIVE 12/06/2014 2207   PROTEINUR NEGATIVE 12/06/2014 2207   UROBILINOGEN 1.0 07/30/2013 1527   NITRITE POSITIVE (A) 12/06/2014 2207   LEUKOCYTESUR SMALL (A) 12/06/2014 2207   Sepsis Labs Invalid input(s): PROCALCITONIN,  WBC,  LACTICIDVEN Microbiology Recent Results (from the past 240 hour(s))  SARS Coronavirus 2 (CEPHEID- Performed in [facility] hospital lab), Hosp Order     Status: None   Collection Time: 05/11/18  10:54 PM  Result Value Ref Range Status   SARS Coronavirus 2 NEGATIVE NEGATIVE Final    Comment: (NOTE) If result is NEGATIVE SARS-CoV-2 target nucleic acids are NOT DETECTED. The SARS-CoV-2 RNA is generally detectable in upper and lower  respiratory specimens during the acute phase of infection. The lowest  concentration of SARS-CoV-2 viral  copies this assay can detect is 250  copies / mL. A negative result does not preclude SARS-CoV-2 infection  and should not be used as the sole basis for treatment or other  patient management decisions.  A negative result may occur with  improper specimen collection / handling, submission of specimen other  than nasopharyngeal swab, presence of viral mutation(s) within the  areas targeted by this assay, and inadequate number of viral copies  (<250 copies / mL). A negative result must be combined with clinical  observations, patient history, and epidemiological information. If result is POSITIVE SARS-CoV-2 target nucleic acids are DETECTED. The SARS-CoV-2 RNA is generally detectable in upper and lower  respiratory specimens dur ing the acute phase of infection.  Positive  results are indicative of active infection with SARS-CoV-2.  Clinical  correlation with patient history and other diagnostic information is  necessary to determine patient infection status.  Positive results do  not rule out bacterial infection or co-infection with other viruses. If result is PRESUMPTIVE POSTIVE SARS-CoV-2 nucleic acids MAY BE PRESENT.   A presumptive positive result was obtained on the submitted specimen  and confirmed on repeat testing.  While 2019 novel coronavirus  (SARS-CoV-2) nucleic acids may be present in the submitted sample  additional confirmatory testing may be necessary for epidemiological  and / or clinical management purposes  to differentiate between  SARS-CoV-2 and other Sarbecovirus currently known to infect humans.  If clinically indicated  additional testing with an alternate test  methodology [phone]) is advised. The SARS-CoV-2 RNA is generally  detectable in upper and lower respiratory sp ecimens during the acute  phase of infection. The expected result is Negative. Fact Sheet for Patients:  StrictlyIdeas.no Fact Sheet for Healthcare Providers: BankingDealers.co.za This test is not yet approved or cleared by the [place] FDA and has been authorized for detection and/or diagnosis of SARS-CoV-2 by FDA under an Emergency Use Authorization (EUA).  This EUA will remain in effect (meaning this test can be used) for the duration of the COVID-19 declaration under Section 564(b)(1) of the Act, 21 U.S.C. section 360bbb-3(b)(1), unless the authorization is terminated or revoked sooner. Performed at [facility], [place] [address], [place], [place] [address]   Gram stain     Status: None   Collection Time: 05/12/18 12:49 PM  Result Value Ref Range Status   Specimen Description PLEURAL  Final   Special Requests NONE  Final   Gram Stain   Final    WBC PRESENT, PREDOMINANTLY MONONUCLEAR NO ORGANISMS SEEN CYTOSPIN SMEAR Performed at [facility], [place] [address] [place], [place] [address]    Report Status 05/12/2018 FINAL  Final  Culture, body fluid-bottle     Status: None (Preliminary result)   Collection Time: 05/12/18 12:50 PM  Result Value Ref Range Status   Specimen Description PLEURAL  Final   Special Requests NONE  Final   Culture   Final    NO GROWTH 3 DAYS Performed at [facility] [address], [place], [place] [address]    Report Status PENDING  Incomplete     Time coordinating discharge: 40 minutes  SIGNED:   Elmarie Shiley, MD  [facility]

## 2018-05-15 NOTE — Progress Notes (Signed)
SATURATION QUALIFICATIONS: (This note is used to comply with regulatory documentation for home oxygen)  Patient Saturations on Room Air at Rest = 91%  Patient Saturations on Room Air while Ambulating = 88%  Patient Saturations on 3 Liters of oxygen while Ambulating = 95%  Please briefly explain why patient needs home oxygen: Pt O2 sat dropped to 88% while ambulating a short distance.

## 2018-05-15 NOTE — Progress Notes (Signed)
PHARMACIST - PHYSICIAN COMMUNICATION ° °CONCERNING: Antibiotic IV to Oral Route Change Policy ° °RECOMMENDATION: °This patient is receiving azithromycin by the intravenous route.  Based on criteria approved by the Pharmacy and Therapeutics Committee, the antibiotic(s) is/are being converted to the equivalent oral dose form(s). ° ° °DESCRIPTION: °These criteria include: °Patient being treated for a respiratory tract infection, urinary tract infection, cellulitis or clostridium difficile associated diarrhea if on metronidazole °The patient is not neutropenic and does not exhibit a GI malabsorption state °The patient is eating (either orally or via tube) and/or has been taking other orally administered medications for a least 24 hours °The patient is improving clinically and has a Tmax < 100.5 ° °If you have questions about this conversion, please contact the Pharmacy Department  °[]  ( [phone] )  [place] °[]  ( [phone] )  [facility] °[x]  ( [phone] )  [place] [place] °[]  ( [phone] )  [facility] °[]  ( [phone] )  [facility]   ° ° °Konner Saiz, PharmD °Clinical Pharmacist °**Pharmacist phone directory can now be found on [email or web address] (PW [email or web address]).  Listed under [facility]. ° ° °

## 2018-05-17 LAB — CULTURE, BODY FLUID W GRAM STAIN -BOTTLE: Culture: NO GROWTH

## 2018-05-18 NOTE — Progress Notes (Signed)
HEMATOLOGY-ONCOLOGY PROGRESS NOTE  CHIEF COMPLIANT: Follow-up after hospitalization for pleural effusion  INTERVAL HISTORY: Deborah Mcintyre is a 63 y.o. [place] with above-mentioned history of triple negative left breast cancer treated with neoadjuvant chemotherapy, left mastectomy, adjuvant radiation, and Xeloda. On 05/11/18 she presented to the ED for SOB and was found to have left side pleural effusion and left upper lob consolidation for which she underwent a thoracentesis yielding 1.5L. Cytology on the fluid adenocarcinoma, consistent with breast primary, with cells positive for Gata-3 and Cytokeratin 7. She presents today over [employer] to discuss treatment options.     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    12/26/2014 Initial Diagnosis    Left breast biopsy: Invasive ductal carcinoma with lymphovascular invasion, 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, grade 3, ER/PR negative, Ki-67 80% HER-2 Neg    01/18/2015 - 06/21/2015 Neo-Adjuvant Chemotherapy    Dose dense Adriamycin and Cytoxan 4 followed by Taxol and carboplatin [id number]    07/03/2015 Breast MRI    Improvement in multiple areas of enhancement largest area 3.4 cm other suspicious nodules anterior to the mass also improved, left axillary internal mammary lymph nodes improved    08/23/2015 Surgery    Left mastectomy with axillary lymph node dissection: Invasive ductal carcinoma with calcifications grade 3, 1.3 cm, image, margins negative, 1/6 lymph nodes positive with extracapsular extension, ER 0%, 0%, T1cN1a stage II a     10/31/2015 - 01/08/2016 Radiation Therapy    Adj XRT Lisbeth Renshaw): Left Chest Wall and Left SCLV treated to 50.4 Gy in 28 fractions of 1.8 Gy. Left Chest  Wall was then boosted to 60.4 Gy in 5 fractions of 2 Gy.    02/03/2016 - 08/03/2016 Chemotherapy    Xeloda 1500 mg by mouth twice a day 2 weeks on one week off    02/04/2017 - 02/10/2017 Hospital Admission    Admitted for bilateral pneumonia    05/13/2018 Relapse/Recurrence    Left pleural effusion with left upper lobe consolidation and paratracheal and AP window lymphadenopathy: Thoracentesis: Cytology, adenocarcinoma consistent with breast primary, positive for GA TA-3 and CK7, negative for CK20 and TTF-1, ER PR HER-2 negative     REVIEW OF SYSTEMS:   Constitutional: Denies fevers, chills or abnormal weight loss Eyes: Denies blurriness of vision Ears, nose, mouth, throat, and face: Denies mucositis or sore throat Respiratory: Denies cough, dyspnea or wheezes Cardiovascular: Denies palpitation, chest discomfort Gastrointestinal:  Denies nausea, heartburn or change in bowel habits Skin: Denies abnormal skin rashes Lymphatics: Denies new lymphadenopathy or easy bruising Neurological:Denies numbness, tingling or new weaknesses Behavioral/Psych: Mood is stable, no new changes  Extremities: No lower extremity edema Breast: denies any pain or lumps or nodules in either breasts All other systems were reviewed with the patient and are negative.  Observations/Objective:  There were no vitals filed for this visit. There is no height or weight on file to calculate BMI.  I have reviewed the data as listed CMP Latest Ref Rng & Units 05/13/2018 05/12/2018 05/11/2018  Glucose 70 - 99 mg/dL 96 130(H) 117(H)  BUN 8 - 23 mg/dL _0 Creatinine 0.44 - 1.00 mg/dL 0.80 0.91 0.99  Sodium 135 - 145 mmol/L 140 141  143  Potassium 3.5 - 5.1 mmol/L 4.0 3.5 3.6  Chloride 98 - 111 mmol/L 104 103 104  CO2 22 - 32 mmol/L _0 Calcium 8.9 - 10.3 mg/dL 8.7(L) 9.2 9.3  Total Protein 6.5 - 8.1 g/dL - 6.9 -  Total Bilirubin 0.3 - 1.2 mg/dL - 0.6 -  Alkaline Phos 38 - 126 U/L - 57 -  AST 15 - 41 U/L - 13(L) -   ALT 0 - 44 U/L - 9 -    Lab Results  Component Value Date   WBC 6.5 05/13/2018   HGB 13.9 05/13/2018   HCT 42.6 05/13/2018   MCV 95.1 05/13/2018   PLT 270 05/13/2018   NEUTROABS 12.5 (H) 02/04/2017      Assessment Plan:  Breast cancer of upper-outer quadrant of left female breast ([place]) 12/26/14: inflammatory breast cancerInvasive ductal carcinoma with lymphovascular invasion, 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  Patient received neoadjuvant chemotherapy with dose dense Adriamycin and Cytoxan followed by Taxol and carboplatin followed by left mastectomy and axillary lymph node dissection 08/23/2015 followed by adjuvant radiation completed 01/08/2016  Metastatic breast cancer: 05/13/2018:Left pleural effusion with left upper lobe consolidation and paratracheal and AP window lymphadenopathy: Thoracentesis: Cytology, adenocarcinoma consistent with breast primary, positive for GA TA-3 and CK7, negative for CK20 and TTF-1, ER PR HER-2 negative.  Treatment plan: Palliative chemotherapy with Halaven days 1 and 8 every 3 weeks Send tumor for [facility] molecular testing Will request PET CT scan.  Discussed risks and benefits of systemic chemotherapy. Plan to start chemotherapy in 2 weeks after port is been placed.   Rulon Eisenmenger, MD 05/19/2018   I, Molly Dorshimer, am acting as scribe for Nicholas Lose, MD.  I have reviewed the above documentation for accuracy and completeness, and I agree with the above.

## 2018-05-19 ENCOUNTER — Other Ambulatory Visit: Payer: Self-pay

## 2018-05-19 ENCOUNTER — Inpatient Hospital Stay: Payer: Medicare Other | Attending: Hematology and Oncology | Admitting: Hematology and Oncology

## 2018-05-19 VITALS — BP 131/97 | HR 113 | Temp 97.7°F | Resp 18 | Ht 67.0 in | Wt 184.1 lb

## 2018-05-19 DIAGNOSIS — Z9012 Acquired absence of left breast and nipple: Secondary | ICD-10-CM | POA: Diagnosis not present

## 2018-05-19 DIAGNOSIS — Z923 Personal history of irradiation: Secondary | ICD-10-CM | POA: Insufficient documentation

## 2018-05-19 DIAGNOSIS — C50412 Malignant neoplasm of upper-outer quadrant of left female breast: Secondary | ICD-10-CM | POA: Diagnosis present

## 2018-05-19 DIAGNOSIS — Z7189 Other specified counseling: Secondary | ICD-10-CM

## 2018-05-19 DIAGNOSIS — Z9221 Personal history of antineoplastic chemotherapy: Secondary | ICD-10-CM | POA: Diagnosis not present

## 2018-05-19 DIAGNOSIS — Z171 Estrogen receptor negative status [ER-]: Secondary | ICD-10-CM | POA: Diagnosis not present

## 2018-05-19 DIAGNOSIS — J90 Pleural effusion, not elsewhere classified: Secondary | ICD-10-CM | POA: Insufficient documentation

## 2018-05-19 DIAGNOSIS — R918 Other nonspecific abnormal finding of lung field: Secondary | ICD-10-CM | POA: Diagnosis not present

## 2018-05-19 MED ORDER — LIDOCAINE-PRILOCAINE 2.5-2.5 % EX CREA: TOPICAL_CREAM | CUTANEOUS | 3 refills | Status: AC

## 2018-05-19 MED ORDER — PROCHLORPERAZINE MALEATE 10 MG PO TABS: 10.0000 mg | ORAL_TABLET | Freq: Four times a day (QID) | ORAL | 1 refills | Status: AC | PRN

## 2018-05-19 MED ORDER — ONDANSETRON HCL 8 MG PO TABS: 8.0000 mg | ORAL_TABLET | Freq: Two times a day (BID) | ORAL | 1 refills | Status: DC | PRN

## 2018-05-19 MED ORDER — OXYCODONE-ACETAMINOPHEN 5-325 MG PO TABS: 1.0000 | ORAL_TABLET | Freq: Three times a day (TID) | ORAL | 0 refills | Status: DC | PRN

## 2018-05-19 NOTE — Assessment & Plan Note (Signed)
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  Patient received neoadjuvant chemotherapy with dose dense Adriamycin and Cytoxan followed by Taxol and carboplatin followed by left mastectomy and axillary lymph node dissection 08/23/2015 followed by adjuvant radiation completed 01/08/2016  Metastatic breast cancer: 05/13/2018:Left pleural effusion with left upper lobe consolidation and paratracheal and AP window lymphadenopathy: Thoracentesis: Cytology, adenocarcinoma consistent with breast primary, positive for GA TA-3 and CK7, negative for CK20 and TTF-1, ER PR HER-2 negative.  Treatment plan: Palliative chemotherapy with Halaven days 1 and 8 every 3 weeks Send tumor for [facility] molecular testing Discussed risks and benefits of systemic chemotherapy. Plan to start chemotherapy in 2 weeks after port is been placed.

## 2018-05-19 NOTE — Progress Notes (Signed)
START ON PATHWAY REGIMEN - Breast     A cycle is every 21 days:     Eribulin mesylate   **Always confirm dose/schedule in your pharmacy ordering system**  Patient Characteristics: Distant Metastases or Locoregional Recurrent Disease - Unresected or Locally Advanced Unresectable Disease Progressing after Neoadjuvant and Local Therapies, HER2 Negative/Unknown/Equivocal, ER Negative/Unknown, Chemotherapy, Second Line Therapeutic Status: Distant Metastases BRCA Mutation Status: Awaiting Test Results ER Status: Negative (-) HER2 Status: Negative (-) PR Status: Negative (-) Line of Therapy: Second Line Intent of Therapy: Non-Curative / Palliative Intent, Discussed with Patient

## 2018-05-20 ENCOUNTER — Telehealth: Payer: Self-pay | Admitting: Hematology and Oncology

## 2018-05-20 NOTE — Telephone Encounter (Signed)
Tried to reach regarding schedule °

## 2018-05-24 ENCOUNTER — Other Ambulatory Visit: Payer: Self-pay | Admitting: Radiology

## 2018-05-25 ENCOUNTER — Encounter (HOSPITAL_COMMUNITY): Payer: Self-pay

## 2018-05-25 ENCOUNTER — Other Ambulatory Visit: Payer: Self-pay

## 2018-05-25 ENCOUNTER — Ambulatory Visit (HOSPITAL_COMMUNITY)
Admission: RE | Admit: 2018-05-25 | Discharge: 2018-05-25 | Disposition: A | Discharge status: Home / Self Care | Payer: Medicare Other | Source: Ambulatory Visit | Attending: Hematology and Oncology | Admitting: Hematology and Oncology

## 2018-05-25 ENCOUNTER — Other Ambulatory Visit: Payer: Self-pay | Admitting: Hematology and Oncology

## 2018-05-25 DIAGNOSIS — C50412 Malignant neoplasm of upper-outer quadrant of left female breast: Secondary | ICD-10-CM | POA: Insufficient documentation

## 2018-05-25 DIAGNOSIS — I10 Essential (primary) hypertension: Secondary | ICD-10-CM | POA: Insufficient documentation

## 2018-05-25 DIAGNOSIS — J449 Chronic obstructive pulmonary disease, unspecified: Secondary | ICD-10-CM | POA: Insufficient documentation

## 2018-05-25 DIAGNOSIS — M1990 Unspecified osteoarthritis, unspecified site: Secondary | ICD-10-CM | POA: Insufficient documentation

## 2018-05-25 DIAGNOSIS — F419 Anxiety disorder, unspecified: Secondary | ICD-10-CM | POA: Insufficient documentation

## 2018-05-25 DIAGNOSIS — Z79899 Other long term (current) drug therapy: Secondary | ICD-10-CM | POA: Insufficient documentation

## 2018-05-25 DIAGNOSIS — F329 Major depressive disorder, single episode, unspecified: Secondary | ICD-10-CM | POA: Insufficient documentation

## 2018-05-25 DIAGNOSIS — R0602 Shortness of breath: Secondary | ICD-10-CM | POA: Diagnosis not present

## 2018-05-25 DIAGNOSIS — Z171 Estrogen receptor negative status [ER-]: Secondary | ICD-10-CM | POA: Insufficient documentation

## 2018-05-25 DIAGNOSIS — C7981 Secondary malignant neoplasm of breast: Secondary | ICD-10-CM | POA: Diagnosis not present

## 2018-05-25 HISTORY — PX: IR IMAGING GUIDED PORT INSERTION: IMG5740

## 2018-05-25 LAB — CBC WITH DIFFERENTIAL/PLATELET
Abs Immature Granulocytes: 0.02 K/uL (ref 0.00–0.07)
Basophils Absolute: 0 K/uL (ref 0.0–0.1)
Basophils Relative: 0 %
Eosinophils Absolute: 0.1 K/uL (ref 0.0–0.5)
Eosinophils Relative: 2 %
HCT: 41.6 % (ref 36.0–46.0)
Hemoglobin: 12.8 g/dL (ref 12.0–15.0)
Immature Granulocytes: 0 %
Lymphocytes Relative: 10 %
Lymphs Abs: 0.7 K/uL (ref 0.7–4.0)
MCH: 30.3 pg (ref 26.0–34.0)
MCHC: 30.8 g/dL (ref 30.0–36.0)
MCV: 98.6 fL (ref 80.0–100.0)
Monocytes Absolute: 0.6 K/uL (ref 0.1–1.0)
Monocytes Relative: 8 %
Neutro Abs: 5.2 K/uL (ref 1.7–7.7)
Neutrophils Relative %: 80 %
Platelets: 298 K/uL (ref 150–400)
RBC: 4.22 MIL/uL (ref 3.87–5.11)
RDW: 12.7 % (ref 11.5–15.5)
WBC: 6.6 K/uL (ref 4.0–10.5)
nRBC: 0 % (ref 0.0–0.2)

## 2018-05-25 LAB — PROTIME-INR
INR: 1 (ref 0.8–1.2)
Prothrombin Time: 13.3 seconds (ref 11.4–15.2)

## 2018-05-25 MED ORDER — MIDAZOLAM HCL 2 MG/2ML IJ SOLN
INTRAMUSCULAR | Status: AC | PRN
  Administered 2018-05-25 (×3): 1 mg via INTRAVENOUS

## 2018-05-25 MED ORDER — MIDAZOLAM HCL 2 MG/2ML IJ SOLN
INTRAMUSCULAR | Status: AC
  Filled 2018-05-25: qty 2

## 2018-05-25 MED ORDER — LIDOCAINE-EPINEPHRINE 2 %-1:100000 IJ SOLN
INTRAMUSCULAR | Status: AC | PRN
  Administered 2018-05-25 (×2): 10 mL

## 2018-05-25 MED ORDER — HEPARIN SOD (PORK) LOCK FLUSH 100 UNIT/ML IV SOLN
INTRAVENOUS | Status: AC | PRN
  Administered 2018-05-25: 500 [IU] via INTRAVENOUS

## 2018-05-25 MED ORDER — CEFAZOLIN SODIUM-DEXTROSE 2-4 GM/100ML-% IV SOLN
INTRAVENOUS | Status: AC
  Administered 2018-05-25: 13:00:00 2 g via INTRAVENOUS
  Filled 2018-05-25: qty 100

## 2018-05-25 MED ORDER — HEPARIN SOD (PORK) LOCK FLUSH 100 UNIT/ML IV SOLN
INTRAVENOUS | Status: AC
  Filled 2018-05-25: qty 5

## 2018-05-25 MED ORDER — FENTANYL CITRATE (PF) 100 MCG/2ML IJ SOLN
INTRAMUSCULAR | Status: AC | PRN
  Administered 2018-05-25 (×2): 50 ug via INTRAVENOUS

## 2018-05-25 MED ORDER — LIDOCAINE-EPINEPHRINE 1 %-1:100000 IJ SOLN
INTRAMUSCULAR | Status: AC
  Filled 2018-05-25: qty 1

## 2018-05-25 MED ORDER — FENTANYL CITRATE (PF) 100 MCG/2ML IJ SOLN
INTRAMUSCULAR | Status: AC
  Filled 2018-05-25: qty 2

## 2018-05-25 MED ORDER — CEFAZOLIN SODIUM-DEXTROSE 2-4 GM/100ML-% IV SOLN
2.0000 g | Freq: Once | INTRAVENOUS | Status: AC
  Administered 2018-05-25: 2 g via INTRAVENOUS

## 2018-05-25 NOTE — Discharge Instructions (Addendum)
Implanted Port Insertion, Care After This sheet gives you information about how to care for yourself after your procedure. Your health care provider may also give you more specific instructions. If you have problems or questions, contact your health care provider. What can I expect after the procedure? After the procedure, it is common to have:  Discomfort at the port insertion site.  Bruising on the skin over the port. This should improve over 3-4 days. Follow these instructions at home: [facility] care  After your port is placed, you will get a manufacturer's information card. The card has information about your port. Keep this card with you at all times.  Take care of the port as told by your health care provider. Ask your health care provider if you or a family member can get training for taking care of the port at home. A home health care nurse may also take care of the port.  Make sure to remember what type of port you have. Incision care      Follow instructions from your health care provider about how to take care of your port insertion site. Make sure you: ? Wash your hands with soap and water before and after you change your bandage (dressing). If soap and water are not available, use hand sanitizer. ? Change your dressing as told by your health care provider.  You may remove your dressing tomorrow. ? Leave stitches (sutures), skin glue, or adhesive strips in place. These skin closures may need to stay in place for 2 weeks or longer. If adhesive strip edges start to loosen and curl up, you may trim the loose edges. Do not remove adhesive strips completely unless your health care provider tells you to do that.  DO NOT use EMLA cream for 2 weeks after port placement as this cream will remove surgical glue on your incision.  Check your port insertion site every day for signs of infection. Check for: ? Redness, swelling, or pain. ? Fluid or blood. ? Warmth. ? Pus or a bad  smell. Activity  Return to your normal activities as told by your health care provider. Ask your health care provider what activities are safe for you.  Do not lift anything that is heavier than 10 lb (4.5 kg), or the limit that you are told, until your health care provider says that it is safe. General instructions  Take over-the-counter and prescription medicines only as told by your health care provider.  Do not take baths, swim, or use a hot tub until your health care provider approves. Ask your health care provider if you may take showers. You may only be allowed to take sponge baths.  You may shower tomorrow.  Do not drive for 24 hours if you were given a sedative during your procedure.  Wear a medical alert bracelet in case of an emergency. This will tell any health care providers that you have a port.  Keep all follow-up visits as told by your health care provider. This is important. Contact a health care provider if:  You cannot flush your port with saline as directed, or you cannot draw blood from the port.  You have a fever or chills.  You have redness, swelling, or pain around your port insertion site.  You have fluid or blood coming from your port insertion site.  Your port insertion site feels warm to the touch.  You have pus or a bad smell coming from the port insertion site. Get help right  away if:  You have chest pain or shortness of breath.  You have bleeding from your port that you cannot control. Summary  Take care of the port as told by your health care provider. Keep the manufacturer's information card with you at all times.  Change your dressing as told by your health care provider.  Contact a health care provider if you have a fever or chills or if you have redness, swelling, or pain around your port insertion site.  Keep all follow-up visits as told by your health care provider. This information is not intended to replace advice given to you by  your health care provider. Make sure you discuss any questions you have with your health care provider. Document Released: 10/12/2012 Document Revised: 07/20/2017 Document Reviewed: 07/20/2017 Elsevier Interactive Patient Education  2019 [place].   Moderate Conscious Sedation, Adult, Care After These instructions provide you with information about caring for yourself after your procedure. Your health care provider may also give you more specific instructions. Your treatment has been planned according to current medical practices, but problems sometimes occur. Call your health care provider if you have any problems or questions after your procedure. What can I expect after the procedure? After your procedure, it is common:  To feel sleepy for several hours.  To feel clumsy and have poor balance for several hours.  To have poor judgment for several hours.  To vomit if you eat too soon. Follow these instructions at home: For at least 24 hours after the procedure:   Do not: ? Participate in activities where you could fall or become injured. ? Drive. ? Use heavy machinery. ? Drink alcohol. ? Take sleeping pills or medicines that cause drowsiness. ? Make important decisions or sign legal documents. ? Take care of children on your own.  Rest. Eating and drinking  Follow the diet recommended by your health care provider.  If you vomit: ? Drink water, juice, or soup when you can drink without vomiting. ? Make sure you have little or no nausea before eating solid foods. General instructions  Have a responsible adult stay with you until you are awake and alert.  Take over-the-counter and prescription medicines only as told by your health care provider.  If you smoke, do not smoke without supervision.  Keep all follow-up visits as told by your health care provider. This is important. Contact a health care provider if:  You keep feeling nauseous or you keep vomiting.  You  feel light-headed.  You develop a rash.  You have a fever. Get help right away if:  You have trouble breathing. This information is not intended to replace advice given to you by your health care provider. Make sure you discuss any questions you have with your health care provider. Document Released: 10/12/2012 Document Revised: 05/27/2015 Document Reviewed: 04/13/2015 Elsevier Interactive Patient Education  2019 [employer].

## 2018-05-25 NOTE — H&P (Signed)
Referring Physician(s): Nicholas Lose  Supervising Physician: Arne Cleveland  Patient Status:  [facility] OP  Chief Complaint: "I'm getting another port a cath"   Subjective: Patient familiar to IR service from prior Port-A-Cath placement in 2017 and removal in 2018 as well as left thoracentesis on 05/12/2018. 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  She now presents with recurrence and is scheduled today for Port-A-Cath placement for palliative chemotherapy.  She currently denies fever, chest pain, back pain, vomiting or bleeding.  She does have occasional headaches, dyspnea with exertion, occasional cough and some intermittent abdominal pain as well as nausea. Past Medical History:  Diagnosis Date  . Anxiety   . Arthritis   . Bronchitis   . COPD (chronic obstructive pulmonary disease) ([place])   . Depression   . Hypertension   . Malignant neoplasm of upper-outer quadrant of left female breast ([place]) 01/04/2015  . Nocturia   . Numbness and tingling    toes - bilateral  . Pneumonia   . PONV (postoperative nausea and vomiting)    Nausea   Past Surgical History:  Procedure Laterality Date  . BREAST LUMPECTOMY Bilateral    x3  . BREAST LUMPECTOMY WITH RADIOACTIVE SEED LOCALIZATION Right 08/23/2015   Procedure: RIGHT BREAST LUMPECTOMY WITH RADIOACTIVE SEED LOCALIZATION;  Surgeon: Alphonsa Overall, MD;  Location: [facility];  Service: General;  Laterality: Right;  . CESAREAN SECTION     x4  . IR REMOVAL TUN ACCESS W/ PORT W/O FL MOD SED  10/20/2016  . IR THORACENTESIS ASP PLEURAL SPACE W/IMG GUIDE  05/12/2018  . MASTECTOMY Left 08/23/2015    RIGHT BREAST LUMPECTOMY WITH RADIOACTIVE SEED LOCALIZATION,  LEFT MASTECTOMY WITH AXILLARY LYMPH NODE DISSECTION  . MASTECTOMY WITH AXILLARY LYMPH NODE DISSECTION Left 08/23/2015   Procedure: LEFT MASTECTOMY WITH AXILLARY LYMPH NODE DISSECTION;  Surgeon: Alphonsa Overall, MD;  Location: [place] [facility];  Service: General;  Laterality: Left;  [name] MULTIPLE TOOTH EXTRACTIONS       Allergies: Patient has no known allergies.  Medications: Prior to Admission medications   Medication Sig Start Date End Date Taking? Authorizing Provider  albuterol (PROVENTIL HFA;VENTOLIN HFA) 108 (90 Base) MCG/ACT inhaler Inhale 1-2 puffs into the lungs every 6 (six) hours as needed for wheezing or shortness of breath.    [provider]  albuterol (PROVENTIL) (2.5 MG/3ML) 0.083% nebulizer solution Take 3 mLs (2.5 mg total) by nebulization every 6 (six) hours as needed for wheezing or shortness of breath. 05/15/18   Regalado, Belkys A, MD  ALPRAZolam (XANAX) 0.5 MG tablet Take 1 tablet (0.5 mg total) by mouth 3 (three) times daily as needed for anxiety or sleep. 05/15/18   Regalado, Belkys A, MD  amLODipine (NORVASC) 5 MG tablet Take 1 tablet (5 mg total) by mouth daily. 05/16/18   Regalado, Belkys A, MD  ferrous sulfate 325 (65 FE) MG tablet Take 1 tablet (325 mg total) by mouth daily with breakfast. 02/10/17   [name], MD  gabapentin (NEURONTIN) 300 MG capsule Take 1 capsule (300 mg total) by mouth 3 (three) times daily. 09/23/17   Nicholas Lose, MD  guaiFENesin-dextromethorphan (ROBITUSSIN DM) 100-10 MG/5ML syrup Take 5 mLs by mouth every 4 (four) hours as needed for cough. 05/15/18   Regalado, Belkys A, MD  lidocaine-prilocaine (EMLA) cream Apply to affected area once 05/19/18   Nicholas Lose, MD  metoprolol (LOPRESSOR) 50 MG tablet Take 0.5 tablets (25  mg total) by mouth 2 (two) times daily. 08/07/13   [name], MD  ondansetron (ZOFRAN) 8 MG tablet Take 1 tablet (8 mg total) by mouth every 8 (eight) hours as needed for nausea. 09/23/17   Nicholas Lose, MD  ondansetron (ZOFRAN) 8 MG tablet Take 1 tablet (8 mg total) by mouth 2 (two) times daily as needed (Nausea or vomiting). 05/19/18   Nicholas Lose, MD  oxyCODONE-acetaminophen (PERCOCET/ROXICET) 5-325 MG tablet Take 1 tablet  by mouth every 8 (eight) hours as needed for moderate pain or severe pain (1 tab every 4-6 hour prn pain). 05/19/18   Nicholas Lose, MD  PARoxetine (PAXIL) 20 MG tablet Take 1 tablet (20 mg total) by mouth 2 (two) times daily. 05/15/18   Regalado, Cassie Freer, MD  PREMARIN vaginal cream INSERT 1 APPLICATORFUL VAGINALLY DAILY Patient taking differently: Place 1 Applicatorful vaginally daily.  04/19/17   Nicholas Lose, MD  prochlorperazine (COMPAZINE) 10 MG tablet Take 1 tablet (10 mg total) by mouth every 6 (six) hours as needed (Nausea or vomiting). 05/19/18   Nicholas Lose, MD     Vital Signs: BP (!) 129/95 (BP Location: Left Arm)   Pulse (!) 112   Temp 98.1 F (36.7 C) (Oral)   Resp 18   SpO2 96%   Physical Exam awake, alert.  Chest with diminished breath sounds left mid to lower lung zones, right clear.  Heart with tachycardic but regular rhythm.  Abdomen soft, positive bowel sounds, mild epigastric tenderness to palpation.  No lower extremity edema.  Imaging: No results found.  Labs:  CBC: Recent Labs    05/11/18 2154 05/12/18 0353 05/13/18 0940  WBC 6.1 5.5 6.5  HGB 14.3 14.5 13.9  HCT 44.6 43.8 42.6  PLT 302 295 270    COAGS: No results for input(s): INR, APTT in the last 8760 hours.  BMP: Recent Labs    05/11/18 2154 05/12/18 0353 05/13/18 0940  NA 143 141 140  K 3.6 3.5 4.0  CL 104 103 104  CO2 28 24 28   GLUCOSE 117* 130* 96  BUN 10 10 18   CALCIUM 9.3 9.2 8.7*  CREATININE 0.99 0.91 0.80  GFRNONAA >60 >60 >60  GFRAA >60 >60 >60    LIVER FUNCTION TESTS: Recent Labs    05/12/18 0353  BILITOT 0.6  AST 13*  ALT 9  ALKPHOS 57  PROT 6.9  ALBUMIN 3.7    Assessment and Plan: Pt with history of metastatic inflammatory carcinoma of the left breast, initially diagnosed in 2016, with prior surgery and chemoradiation.  She now presents with recurrence and is scheduled today for Port-A-Cath placement for palliative chemotherapy.  Risks and benefits of image  guided port-a-catheter placement was discussed with the patient including, but not limited to bleeding, infection, pneumothorax, or fibrin sheath development and need for additional procedures.  All of the patient's questions were answered, patient is agreeable to proceed. Consent signed and in chart.  LABS PENDING   Electronically Signed: D. Rowe Robert, PA-C 05/25/2018, 10:25 AM   I spent a total of 20 minutes at the the patient's bedside AND on the patient's hospital floor or unit, greater than 50% of which was counseling/coordinating care for Port-A-Cath placement

## 2018-05-25 NOTE — Procedures (Signed)
  Procedure: R IJ Port catheter placement   EBL:   minimal Complications:  none immediate  See full dictation in [facility].  Dillard Cannon MD Main # [phone] Pager  [phone]

## 2018-05-27 ENCOUNTER — Inpatient Hospital Stay (HOSPITAL_COMMUNITY)
Admission: EM | Admit: 2018-05-27 | Discharge: 2018-05-29 | DRG: 579 | Disposition: A | Discharge status: Home Health | Payer: Medicare Other | Attending: Internal Medicine | Admitting: Internal Medicine

## 2018-05-27 ENCOUNTER — Telehealth: Payer: Self-pay | Admitting: *Deleted

## 2018-05-27 ENCOUNTER — Other Ambulatory Visit: Payer: Self-pay

## 2018-05-27 ENCOUNTER — Emergency Department (HOSPITAL_COMMUNITY): Payer: Medicare Other

## 2018-05-27 DIAGNOSIS — Z9221 Personal history of antineoplastic chemotherapy: Secondary | ICD-10-CM

## 2018-05-27 DIAGNOSIS — J939 Pneumothorax, unspecified: Secondary | ICD-10-CM

## 2018-05-27 DIAGNOSIS — Z7289 Other problems related to lifestyle: Secondary | ICD-10-CM

## 2018-05-27 DIAGNOSIS — J9622 Acute and chronic respiratory failure with hypercapnia: Secondary | ICD-10-CM | POA: Diagnosis not present

## 2018-05-27 DIAGNOSIS — J449 Chronic obstructive pulmonary disease, unspecified: Secondary | ICD-10-CM | POA: Diagnosis present

## 2018-05-27 DIAGNOSIS — Z803 Family history of malignant neoplasm of breast: Secondary | ICD-10-CM

## 2018-05-27 DIAGNOSIS — C7981 Secondary malignant neoplasm of breast: Secondary | ICD-10-CM | POA: Diagnosis present

## 2018-05-27 DIAGNOSIS — J910 Malignant pleural effusion: Secondary | ICD-10-CM

## 2018-05-27 DIAGNOSIS — Z9012 Acquired absence of left breast and nipple: Secondary | ICD-10-CM | POA: Diagnosis not present

## 2018-05-27 DIAGNOSIS — I10 Essential (primary) hypertension: Secondary | ICD-10-CM | POA: Diagnosis present

## 2018-05-27 DIAGNOSIS — Z1159 Encounter for screening for other viral diseases: Secondary | ICD-10-CM

## 2018-05-27 DIAGNOSIS — F419 Anxiety disorder, unspecified: Secondary | ICD-10-CM | POA: Diagnosis present

## 2018-05-27 DIAGNOSIS — R079 Chest pain, unspecified: Secondary | ICD-10-CM

## 2018-05-27 DIAGNOSIS — Z923 Personal history of irradiation: Secondary | ICD-10-CM | POA: Diagnosis not present

## 2018-05-27 DIAGNOSIS — Z9889 Other specified postprocedural states: Secondary | ICD-10-CM

## 2018-05-27 DIAGNOSIS — J9811 Atelectasis: Secondary | ICD-10-CM | POA: Diagnosis present

## 2018-05-27 DIAGNOSIS — F17210 Nicotine dependence, cigarettes, uncomplicated: Secondary | ICD-10-CM | POA: Diagnosis present

## 2018-05-27 DIAGNOSIS — J9621 Acute and chronic respiratory failure with hypoxia: Secondary | ICD-10-CM | POA: Diagnosis not present

## 2018-05-27 DIAGNOSIS — J90 Pleural effusion, not elsewhere classified: Secondary | ICD-10-CM

## 2018-05-27 DIAGNOSIS — R0602 Shortness of breath: Secondary | ICD-10-CM

## 2018-05-27 DIAGNOSIS — Z9981 Dependence on supplemental oxygen: Secondary | ICD-10-CM | POA: Diagnosis not present

## 2018-05-27 DIAGNOSIS — C50412 Malignant neoplasm of upper-outer quadrant of left female breast: Secondary | ICD-10-CM | POA: Diagnosis present

## 2018-05-27 LAB — COMPREHENSIVE METABOLIC PANEL
ALT: 9 U/L (ref 0–44)
AST: 10 U/L — ABNORMAL LOW (ref 15–41)
Albumin: 3.5 g/dL (ref 3.5–5.0)
Alkaline Phosphatase: 58 U/L (ref 38–126)
Anion gap: 11 (ref 5–15)
BUN: 10 mg/dL (ref 8–23)
CO2: 29 mmol/L (ref 22–32)
Calcium: 9.2 mg/dL (ref 8.9–10.3)
Chloride: 102 mmol/L (ref 98–111)
Creatinine, Ser: 0.59 mg/dL (ref 0.44–1.00)
GFR calc Af Amer: >60 mL/min (ref >60)
GFR calc non Af Amer: >60 mL/min (ref >60)
Glucose, Bld: 105 mg/dL — ABNORMAL HIGH (ref 70–99)
Potassium: 3.5 mmol/L (ref 3.5–5.1)
Sodium: 142 mmol/L (ref 135–145)
Total Bilirubin: 0.2 mg/dL — ABNORMAL LOW (ref 0.3–1.2)
Total Protein: 7.1 g/dL (ref 6.5–8.1)

## 2018-05-27 LAB — CBC WITH DIFFERENTIAL/PLATELET
Abs Immature Granulocytes: 0.04 10*3/uL (ref 0.00–0.07)
Basophils Absolute: 0 10*3/uL (ref 0.0–0.1)
Basophils Relative: 0 %
Eosinophils Absolute: 0.1 10*3/uL (ref 0.0–0.5)
Eosinophils Relative: 1 %
HCT: 42.3 % (ref 36.0–46.0)
Hemoglobin: 13.3 g/dL (ref 12.0–15.0)
Immature Granulocytes: 1 %
Lymphocytes Relative: 5 %
Lymphs Abs: 0.4 10*3/uL — ABNORMAL LOW (ref 0.7–4.0)
MCH: 30.9 pg (ref 26.0–34.0)
MCHC: 31.4 g/dL (ref 30.0–36.0)
MCV: 98.4 fL (ref 80.0–100.0)
Monocytes Absolute: 0.2 10*3/uL (ref 0.1–1.0)
Monocytes Relative: 3 %
Neutro Abs: 7.8 10*3/uL — ABNORMAL HIGH (ref 1.7–7.7)
Neutrophils Relative %: 90 %
Platelets: 280 10*3/uL (ref 150–400)
RBC: 4.3 MIL/uL (ref 3.87–5.11)
RDW: 12.7 % (ref 11.5–15.5)
WBC: 8.6 10*3/uL (ref 4.0–10.5)
nRBC: 0 % (ref 0.0–0.2)

## 2018-05-27 LAB — SARS CORONAVIRUS 2 BY RT PCR (HOSPITAL ORDER, PERFORMED IN ~~LOC~~ HOSPITAL LAB): SARS Coronavirus 2: NEGATIVE

## 2018-05-27 MED ORDER — ALPRAZOLAM 0.5 MG PO TABS
0.5000 mg | ORAL_TABLET | Freq: Three times a day (TID) | ORAL | Status: DC | PRN
  Administered 2018-05-29: 0.5 mg via ORAL
  Filled 2018-05-27: qty 1

## 2018-05-27 MED ORDER — AMLODIPINE BESYLATE 5 MG PO TABS
5.0000 mg | ORAL_TABLET | Freq: Every day | ORAL | Status: DC
  Administered 2018-05-28 – 2018-05-29 (×2): 5 mg via ORAL
  Filled 2018-05-27 (×2): qty 1

## 2018-05-27 MED ORDER — NICOTINE 14 MG/24HR TD PT24
14.0000 mg | MEDICATED_PATCH | Freq: Every day | TRANSDERMAL | Status: DC
  Filled 2018-05-27: qty 1

## 2018-05-27 MED ORDER — METOPROLOL TARTRATE 25 MG PO TABS
25.0000 mg | ORAL_TABLET | Freq: Two times a day (BID) | ORAL | Status: DC
  Administered 2018-05-27 – 2018-05-29 (×4): 25 mg via ORAL
  Filled 2018-05-27 (×4): qty 1

## 2018-05-27 MED ORDER — ONDANSETRON HCL 4 MG/2ML IJ SOLN
4.0000 mg | Freq: Once | INTRAMUSCULAR | Status: AC
  Administered 2018-05-27: 4 mg via INTRAVENOUS
  Filled 2018-05-27: qty 2

## 2018-05-27 MED ORDER — DEXTROSE-NACL 5-0.45 % IV SOLN
INTRAVENOUS | Status: DC
  Administered 2018-05-27: 23:00:00 via INTRAVENOUS

## 2018-05-27 MED ORDER — OXYCODONE-ACETAMINOPHEN 5-325 MG PO TABS
1.0000 | ORAL_TABLET | Freq: Three times a day (TID) | ORAL | Status: DC | PRN
  Filled 2018-05-27: qty 1

## 2018-05-27 MED ORDER — HYDROMORPHONE HCL 1 MG/ML IJ SOLN
1.0000 mg | INTRAMUSCULAR | Status: DC | PRN
  Administered 2018-05-27 – 2018-05-29 (×11): 1 mg via INTRAVENOUS
  Filled 2018-05-27 (×12): qty 1

## 2018-05-27 MED ORDER — NICOTINE 14 MG/24HR TD PT24
14.0000 mg | MEDICATED_PATCH | Freq: Every day | TRANSDERMAL | Status: DC
  Administered 2018-05-27 – 2018-05-29 (×3): 14 mg via TRANSDERMAL
  Filled 2018-05-27 (×2): qty 1

## 2018-05-27 MED ORDER — ONDANSETRON HCL 4 MG PO TABS
8.0000 mg | ORAL_TABLET | Freq: Three times a day (TID) | ORAL | Status: DC | PRN
  Administered 2018-05-28 – 2018-05-29 (×2): 8 mg via ORAL
  Filled 2018-05-27 (×2): qty 2

## 2018-05-27 MED ORDER — PAROXETINE HCL 20 MG PO TABS
20.0000 mg | ORAL_TABLET | Freq: Two times a day (BID) | ORAL | Status: DC
  Administered 2018-05-27 – 2018-05-29 (×4): 20 mg via ORAL
  Filled 2018-05-27 (×4): qty 1

## 2018-05-27 MED ORDER — GABAPENTIN 300 MG PO CAPS
300.0000 mg | ORAL_CAPSULE | Freq: Three times a day (TID) | ORAL | Status: DC
  Administered 2018-05-27 – 2018-05-29 (×6): 300 mg via ORAL
  Filled 2018-05-27 (×6): qty 1

## 2018-05-27 MED ORDER — ALBUTEROL SULFATE (2.5 MG/3ML) 0.083% IN NEBU: 2.5000 mg | INHALATION_SOLUTION | Freq: Four times a day (QID) | RESPIRATORY_TRACT | Status: DC | PRN

## 2018-05-27 MED ORDER — HYDROMORPHONE HCL 1 MG/ML IJ SOLN
0.5000 mg | Freq: Once | INTRAMUSCULAR | Status: AC
  Administered 2018-05-27: 0.5 mg via INTRAVENOUS
  Filled 2018-05-27: qty 1

## 2018-05-27 NOTE — Assessment & Plan Note (Deleted)
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  Patient received neoadjuvant chemotherapy with dose dense Adriamycin and Cytoxan followed by Taxol and carboplatin followed by left mastectomy and axillary lymph node dissection 08/23/2015 followed by adjuvant radiation completed 01/08/2016  Metastatic breast cancer: 05/13/2018:Left pleural effusion with left upper lobe consolidation and paratracheal and AP window lymphadenopathy: Thoracentesis: Cytology, adenocarcinoma consistent with breast primary, positive for GA TA-3 and CK7, negative for CK20 and TTF-1, ER PR HER-2 negative.  Treatment plan: Palliative chemotherapy with Halaven days 1 and 8 every 3 weeks Send tumor for [facility] molecular testing --------------------------------------------------------------------------------------------------------------------------------------- Current treatment: Cycle 1 day 1 Halaven Chemo consent obtained Chemo education given Labs reviewed Return to clinic in 1 week for toxicity check and for cycle 1 day 8 Halaven

## 2018-05-27 NOTE — ED Notes (Signed)
Bed: KQ20 Expected date:  Expected time:  Means of arrival:  Comments: EMS- 63yo [place] Pt, SOB

## 2018-05-27 NOTE — ED Notes (Signed)
X-ray at bedside

## 2018-05-27 NOTE — ED Provider Notes (Signed)
Care transferred to me. Patient's CXR shows complete opacification of left lung. Will need repeat thoracentesis, maybe drain? No distress at this time, but will need admission. Dr. Si Raider to admit.  Results for orders placed or performed during the hospital encounter of 05/27/18  SARS Coronavirus 2 (CEPHEID - Performed in [facility] hospital lab), [facility] Order  Result Value Ref Range   SARS Coronavirus 2 NEGATIVE NEGATIVE  CBC with Differential/Platelet  Result Value Ref Range   WBC 8.6 4.0 - 10.5 K/uL   RBC 4.30 3.87 - 5.11 MIL/uL   Hemoglobin 13.3 12.0 - 15.0 g/dL   HCT 42.3 36.0 - 46.0 %   MCV 98.4 80.0 - 100.0 fL   MCH 30.9 26.0 - 34.0 pg   MCHC 31.4 30.0 - 36.0 g/dL   RDW 12.7 11.5 - 15.5 %   Platelets 280 150 - 400 K/uL   nRBC 0.0 0.0 - 0.2 %   Neutrophils Relative % 90 %   Neutro Abs 7.8 (H) 1.7 - 7.7 K/uL   Lymphocytes Relative 5 %   Lymphs Abs 0.4 (L) 0.7 - 4.0 K/uL   Monocytes Relative 3 %   Monocytes Absolute 0.2 0.1 - 1.0 K/uL   Eosinophils Relative 1 %   Eosinophils Absolute 0.1 0.0 - 0.5 K/uL   Basophils Relative 0 %   Basophils Absolute 0.0 0.0 - 0.1 K/uL   Immature Granulocytes 1 %   Abs Immature Granulocytes 0.04 0.00 - 0.07 K/uL  Comprehensive metabolic panel  Result Value Ref Range   Sodium 142 135 - 145 mmol/L   Potassium 3.5 3.5 - 5.1 mmol/L   Chloride 102 98 - 111 mmol/L   CO2 29 22 - 32 mmol/L   Glucose, Bld 105 (H) 70 - 99 mg/dL   BUN 10 8 - 23 mg/dL   Creatinine, Ser 0.59 0.44 - 1.00 mg/dL   Calcium 9.2 8.9 - 10.3 mg/dL   Total Protein 7.1 6.5 - 8.1 g/dL   Albumin 3.5 3.5 - 5.0 g/dL   AST 10 (L) 15 - 41 U/L   ALT 9 0 - 44 U/L   Alkaline Phosphatase 58 38 - 126 U/L   Total Bilirubin 0.2 (L) 0.3 - 1.2 mg/dL   GFR calc non Af Amer >60 >60 mL/min   GFR calc Af Amer >60 >60 mL/min   Anion gap 11 5 - 15   Dg Chest 1 View  Result Date: 05/12/2018 CLINICAL DATA:  Status post left thoracentesis. EXAM: CHEST  1 VIEW COMPARISON:  CT chest from same day.   Chest x-ray from yesterday. FINDINGS: Stable cardiomediastinal silhouette with silhouetting of the left heart border. Interval decrease in size of now moderate loculated left pleural effusion. Dense consolidation in the left upper lobe as seen on earlier chest CT. Mildly improved aeration of the left lung. The right lung remains clear. No pneumothorax. No acute osseous abnormality. IMPRESSION: 1. Decrease in size of now moderate loculated left pleural effusion with mildly improved left lung aeration. No pneumothorax. 2. Persistent dense consolidation in the left upper lobe. Electronically Signed   By: Titus Dubin M.D.   On: 05/12/2018 13:23   Dg Chest 2 View  Result Date: 05/15/2018 CLINICAL DATA:  SOB (shortness of breath) EXAM: CHEST - 2 VIEW COMPARISON:  Radiograph 05/12/2018 FINDINGS: Normal cardiac silhouette. Opacification of the LEFT hemithorax with volume loss again noted. There is some improved aeration to the LEFT lung compared to prior. Large effusion noted. RIGHT lung clear. No pneumothorax.  IMPRESSION: 1. Some improved aeration to the LEFT lung. Persistent dense opacification of the entire LEFT hemithorax with volume loss and effusion. 2. RIGHT lung clear. Electronically Signed   By: Suzy Bouchard M.D.   On: 05/15/2018 06:05   Ct Chest W Contrast  Result Date: 05/12/2018 CLINICAL DATA:  Left side chest pain, shortness of breath EXAM: CT CHEST WITH CONTRAST TECHNIQUE: Multidetector CT imaging of the chest was performed during intravenous contrast administration. CONTRAST:  48mL OMNIPAQUE IOHEXOL 300 MG/ML  SOLN COMPARISON:  Chest x-ray 05/11/2018 FINDINGS: Cardiovascular: Heart is normal size. Scattered aortic calcifications. Aorta is normal size. Mediastinum/Nodes: Enlarged mediastinal lymph nodes. Right paratracheal lymph node has a short axis diameter of 1.7 cm on image 56. Prevascular/AP window lymph nodal mass has a short axis diameter of 1.7 cm. No axillary adenopathy or right hilar  adenopathy. Difficult to assess the left hilum due to airspace disease and fluid. Lungs/Pleura: Near complete opacification of the left hemithorax due to large partially loculated left effusion and airspace disease. Dense consolidation in the left upper lobe. Compressive atelectasis in the left lower lobe. No effusion on the right. Ground-glass nodule posteriorly in the right lower lobe measures 11 mm. Upper Abdomen: Imaging into the upper abdomen shows no acute findings. Musculoskeletal: Prior left mastectomy.  No acute bony abnormality. IMPRESSION: Dense consolidative airspace disease in the left upper lobe with large partially loculated left pleural effusion and compressive atelectasis in the left lower lobe. This could be related to pneumonia. A postobstructive process cannot be excluded. Mediastinal adenopathy. Prior left mastectomy. Electronically Signed   By: Rolm Baptise M.D.   On: 05/12/2018 01:25   Dg Chest Port 1 View  Result Date: 05/27/2018 CLINICAL DATA:  Chest pain and shortness of breath EXAM: PORTABLE CHEST 1 VIEW COMPARISON:  May 15, 2018 FINDINGS: There is complete opacification on the left without appreciable shift of heart and mediastinum toward the left. The right lung is clear. Heart appears mildly enlarged with visualized pulmonary vascularity normal. Note that the pulmonary vascular on the left cannot be assessed. Port-A-Cath tip is at the cavoatrial junction. No pneumothorax. No bone lesions. No adenopathy in areas for adenopathy can be assessed. IMPRESSION: Complete opacification on the left. Question combination of consolidation and pleural effusion. Note that the trachea remains essentially midline. Heart is not appear shifted toward the left. Right lung essentially clear. Heart appears prominent although difficult to assess due to the diffuse opacification on the left side. Electronically Signed   By: Lowella Grip III M.D.   On: 05/27/2018 17:35   Dg Chest Port 1  View  Result Date: 05/11/2018 CLINICAL DATA:  Shortness of breath EXAM: PORTABLE CHEST 1 VIEW COMPARISON:  02/04/2017 FINDINGS: There is near complete opacification of the left hemithorax. There is no significant shift of the mediastinum to the right. The cardiac silhouette is suboptimally evaluated secondary to the appearance of the left hemithorax. No definite pneumothorax. The right lung field appears essentially clear. Surgical clips are noted in the left axilla. IMPRESSION: Near complete opacification of the left hemithorax which may be secondary to a large left-sided pleural effusion and/or confluent airspace disease. An underlying mass cannot be excluded. Electronically Signed   By: Constance Holster M.D.   On: 05/11/2018 21:57   Ir Imaging Guided Port Insertion  Result Date: 05/25/2018 CLINICAL DATA:  Recurrent inflammatory left breast carcinoma, needs durable venous access for chemotherapy EXAM: TUNNELED PORT CATHETER PLACEMENT WITH ULTRASOUND AND FLUOROSCOPIC GUIDANCE FLUOROSCOPY TIME:  0.1 minute;  25  uGym2 DAP ANESTHESIA/SEDATION: Intravenous Fentanyl 123mcg and Versed 3mg  were administered as conscious sedation during continuous monitoring of the patient's level of consciousness and physiological / cardiorespiratory status by the radiology RN, with a total moderate sedation time of 18 minutes. TECHNIQUE: The procedure, risks, benefits, and alternatives were explained to the patient. Questions regarding the procedure were encouraged and answered. The patient understands and consents to the procedure. As antibiotic prophylaxis, cefazolin 2 g was ordered pre-procedure and administered intravenously within one hour of incision. Patency of the right IJ vein was confirmed with ultrasound with image documentation. An appropriate skin site was determined. Skin site was marked. Region was prepped using maximum barrier technique including cap and mask, sterile gown, sterile gloves, large sterile sheet, and  Chlorhexidine as cutaneous antisepsis. The region was infiltrated locally with 1% lidocaine. Under real-time ultrasound guidance, the right IJ vein was accessed with a 21 gauge micropuncture needle; the needle tip within the vein was confirmed with ultrasound image documentation. Needle was exchanged over a 018 guidewire for transitional dilator which allowed passage of the [facility] wire into the IVC. Over this, the transitional dilator was exchanged for a 5 [place] MPA catheter. A small incision was made on the right anterior chest wall and a subcutaneous pocket fashioned. The power-injectable port was positioned and its catheter tunneled to the right IJ dermatotomy site. The MPA catheter was exchanged over an Amplatz wire for a peel-away sheath, through which the port catheter, which had been trimmed to the appropriate length, was advanced and positioned under fluoroscopy with its tip at the cavoatrial junction. Spot chest radiograph confirms good catheter position and no pneumothorax. The pocket was closed with deep interrupted and subcuticular continuous 3-0 Monocryl sutures. The port was flushed per protocol. The incisions were covered with Dermabond then covered with a sterile dressing. COMPLICATIONS: COMPLICATIONS None immediate IMPRESSION: Technically successful right IJ power-injectable port catheter placement. Ready for routine use. Electronically Signed   By: Lucrezia Europe M.D.   On: 05/25/2018 14:51   Ir Thoracentesis Asp Pleural Space W/img Guide  Result Date: 05/12/2018 INDICATION: Patient with history of left-sided breast cancer, COPD, dyspnea, and left pleural effusion. Request is made for diagnostic and therapeutic left thoracentesis. EXAM: ULTRASOUND GUIDED DIAGNOSTIC AND THERAPEUTIC THORACENTESIS MEDICATIONS: 10 mL 1% lidocaine COMPLICATIONS: None immediate. PROCEDURE: An ultrasound guided thoracentesis was thoroughly discussed with the patient and questions answered. The benefits, risks,  alternatives and complications were also discussed. The patient understands and wishes to proceed with the procedure. Written consent was obtained. Ultrasound was performed to localize and mark an adequate pocket of fluid in the left chest. The area was then prepped and draped in the normal sterile fashion. 1% Lidocaine was used for local anesthesia. Under ultrasound guidance a 6 Fr Safe-T-Centesis catheter was introduced. Thoracentesis was performed. The catheter was removed and a dressing applied. FINDINGS: A total of approximately 1.55 L of hazy gold fluid was removed. Procedure was stopped after 1.55 L due to patient coughing/discomfort. Samples were sent to the laboratory as requested by the clinical team. IMPRESSION: Successful ultrasound guided left thoracentesis yielding 1.55 L of pleural fluid. Read by: Earley Abide, PA-C Electronically Signed   By: Jerilynn Mages.  Shick M.D.   On: 05/12/2018 13:35      Sherwood Gambler, MD 05/27/18 [phone]

## 2018-05-27 NOTE — ED Provider Notes (Signed)
[facility] DEPT Provider Note   CSN: [id number] Arrival date & time: 05/27/18  1549    History   Chief Complaint Chief Complaint  Patient presents with  . Shortness of Breath    HPI Deborah Mcintyre is a 63 y.o. female.     Patient is a 63 year old female with a history of COPD who continues to smoke, breast cancer which is recently found to have metastasized to the left lung with pleural effusion which required thoracentesis on 05/12/2018 who has been on 2 L of home oxygen since she was discharged on the 10th presenting today with worsening chest pain and shortness of breath.  Patient states over the last 3 days she has had significant worsening shortness of breath not improved with oxygen therapy or her nebulizer.  She has had a cough that is been nonproductive.  She is having significant pain in her chest wall mostly on the left side and back but also on the right side.  Certain positioning makes the pain worse and coughing.  It is a sharp pain that is not improved with her home Percocet.  She had a port placed last week and was planning on starting IV chemotherapy next week.  She was COVID -2 weeks ago upon hospitalization.  The history is provided by the patient.  Shortness of Breath  Severity:  Moderate Onset quality:  Gradual Duration:  3 days Timing:  Constant Progression:  Worsening Chronicity:  Recurrent Context: activity   Relieved by:  Rest Worsened by:  Activity and coughing Ineffective treatments: slightly better with neb but doesn't last long. Associated symptoms: chest pain, cough and wheezing   Associated symptoms: no abdominal pain, no fever, no hemoptysis, no sputum production and no vomiting   Risk factors: hx of cancer     Past Medical History:  Diagnosis Date  . Anxiety   . Arthritis   . Bronchitis   . COPD (chronic obstructive pulmonary disease) ([place])   . Depression   . Hypertension   . Malignant neoplasm of  upper-outer quadrant of left female breast ([place]) 01/04/2015  . Nocturia   . Numbness and tingling    toes - bilateral  . Pneumonia   . PONV (postoperative nausea and vomiting)    Nausea    Patient Active Problem List   Diagnosis Date Noted  . Goals of care, counseling/discussion 05/19/2018  . Pleural effusion on left 05/12/2018  . Hypertension 05/12/2018  . COPD (chronic obstructive pulmonary disease) ([place]) 05/12/2018  . Anxiety and depression 05/12/2018  . Community acquired pneumonia of left lung 02/04/2017  . Antineoplastic chemotherapy induced pancytopenia ([place]) 04/26/2015  . Antineoplastic chemotherapy induced anemia 04/05/2015  . Breast cancer of upper-outer quadrant of left female breast ([place]) 01/01/2015  . Acute exacerbation of chronic bronchitis ([place]) 08/03/2013    Past Surgical History:  Procedure Laterality Date  . BREAST LUMPECTOMY Bilateral    x3  . BREAST LUMPECTOMY WITH RADIOACTIVE SEED LOCALIZATION Right 08/23/2015   Procedure: RIGHT BREAST LUMPECTOMY WITH RADIOACTIVE SEED LOCALIZATION;  Surgeon: Alphonsa Overall, MD;  Location: [facility];  Service: General;  Laterality: Right;  . CESAREAN SECTION     x4  . IR IMAGING GUIDED PORT INSERTION  05/25/2018  . IR REMOVAL TUN ACCESS W/ PORT W/O FL MOD SED  10/20/2016  . IR THORACENTESIS ASP PLEURAL SPACE W/IMG GUIDE  05/12/2018  . MASTECTOMY Left 08/23/2015    RIGHT BREAST LUMPECTOMY WITH RADIOACTIVE SEED LOCALIZATION,  LEFT MASTECTOMY WITH AXILLARY LYMPH  NODE DISSECTION  . MASTECTOMY WITH AXILLARY LYMPH NODE DISSECTION Left 08/23/2015   Procedure: LEFT MASTECTOMY WITH AXILLARY LYMPH NODE DISSECTION;  Surgeon: Alphonsa Overall, MD;  Location: [facility];  Service: General;  Laterality: Left;  [name] MULTIPLE TOOTH EXTRACTIONS       OB History   No obstetric history on file.      Home Medications    Prior to Admission medications   Medication Sig Start Date End Date Taking? Authorizing Provider  albuterol (PROVENTIL HFA;VENTOLIN HFA)  108 (90 Base) MCG/ACT inhaler Inhale 1-2 puffs into the lungs every 6 (six) hours as needed for wheezing or shortness of breath.    [provider]  albuterol (PROVENTIL) (2.5 MG/3ML) 0.083% nebulizer solution Take 3 mLs (2.5 mg total) by nebulization every 6 (six) hours as needed for wheezing or shortness of breath. 05/15/18   Regalado, Belkys A, MD  ALPRAZolam (XANAX) 0.5 MG tablet Take 1 tablet (0.5 mg total) by mouth 3 (three) times daily as needed for anxiety or sleep. 05/15/18   Regalado, Belkys A, MD  amLODipine (NORVASC) 5 MG tablet Take 1 tablet (5 mg total) by mouth daily. 05/16/18   Regalado, Belkys A, MD  ferrous sulfate 325 (65 FE) MG tablet Take 1 tablet (325 mg total) by mouth daily with breakfast. 02/10/17   [name], MD  gabapentin (NEURONTIN) 300 MG capsule Take 1 capsule (300 mg total) by mouth 3 (three) times daily. 09/23/17   Nicholas Lose, MD  guaiFENesin-dextromethorphan (ROBITUSSIN DM) 100-10 MG/5ML syrup Take 5 mLs by mouth every 4 (four) hours as needed for cough. 05/15/18   Regalado, Belkys A, MD  lidocaine-prilocaine (EMLA) cream Apply to affected area once 05/19/18   Nicholas Lose, MD  metoprolol (LOPRESSOR) 50 MG tablet Take 0.5 tablets (25 mg total) by mouth 2 (two) times daily. 08/07/13   [name], MD  ondansetron (ZOFRAN) 8 MG tablet Take 1 tablet (8 mg total) by mouth every 8 (eight) hours as needed for nausea. 09/23/17   Nicholas Lose, MD  ondansetron (ZOFRAN) 8 MG tablet Take 1 tablet (8 mg total) by mouth 2 (two) times daily as needed (Nausea or vomiting). 05/19/18   Nicholas Lose, MD  oxyCODONE-acetaminophen (PERCOCET/ROXICET) 5-325 MG tablet Take 1 tablet by mouth every 8 (eight) hours as needed for moderate pain or severe pain (1 tab every 4-6 hour prn pain). 05/19/18   Nicholas Lose, MD  PARoxetine (PAXIL) 20 MG tablet Take 1 tablet (20 mg total) by mouth 2 (two) times daily. 05/15/18   Regalado, Cassie Freer, MD  PREMARIN vaginal cream INSERT 1 APPLICATORFUL  VAGINALLY DAILY Patient taking differently: Place 1 Applicatorful vaginally daily.  04/19/17   Nicholas Lose, MD  prochlorperazine (COMPAZINE) 10 MG tablet Take 1 tablet (10 mg total) by mouth every 6 (six) hours as needed (Nausea or vomiting). 05/19/18   Nicholas Lose, MD    Family History Family History  Problem Relation Age of Onset  . Breast cancer Maternal Aunt     Social History Social History   Tobacco Use  . Smoking status: Current Every Day Smoker    Packs/day: 0.75    Years: 45.00    Pack years: 33.75    Types: Cigarettes  . Smokeless tobacco: Never Used  Substance Use Topics  . Alcohol use: Yes    Comment: ocassionally  . Drug use: No     Allergies   Patient has no known allergies.   Review of Systems Review of Systems  Constitutional: Negative  for fever.  Respiratory: Positive for cough, shortness of breath and wheezing. Negative for hemoptysis and sputum production.   Cardiovascular: Positive for chest pain.  Gastrointestinal: Negative for abdominal pain and vomiting.  All other systems reviewed and are negative.    Physical Exam Updated Vital Signs SpO2 99%   Physical Exam Vitals signs and nursing note reviewed.  Constitutional:      General: She is not in acute distress.    Appearance: She is well-developed.  HENT:     Head: Normocephalic and atraumatic.  Eyes:     Conjunctiva/sclera: Conjunctivae normal.     Pupils: Pupils are equal, round, and reactive to light.  Neck:     Musculoskeletal: Normal range of motion and neck supple.  Cardiovascular:     Rate and Rhythm: Normal rate and regular rhythm.     Heart sounds: No murmur.  Pulmonary:     Effort: Pulmonary effort is normal. No respiratory distress.     Breath sounds: Examination of the left-upper field reveals decreased breath sounds. Examination of the left-middle field reveals decreased breath sounds. Examination of the left-lower field reveals decreased breath sounds. Decreased  breath sounds and wheezing present. No rales.     Comments: Mild expiratory wheezing.  Healing port site in the right upper chest Chest:     Chest wall: Tenderness present. No deformity.  Abdominal:     General: There is no distension.     Palpations: Abdomen is soft.     Tenderness: There is no abdominal tenderness. There is no guarding or rebound.  Musculoskeletal: Normal range of motion.        General: No tenderness.  Skin:    General: Skin is warm and dry.     Capillary Refill: Capillary refill takes less than 2 seconds.     Findings: No erythema or rash.  Neurological:     General: No focal deficit present.     Mental Status: She is alert and oriented to person, place, and time.  Psychiatric:        Behavior: Behavior normal.      ED Treatments / Results  Labs (all labs ordered are listed, but only abnormal results are displayed) Labs Reviewed  SARS CORONAVIRUS 2 (HOSPITAL ORDER, [facility])  CBC WITH DIFFERENTIAL/PLATELET  COMPREHENSIVE METABOLIC PANEL    EKG None  Radiology No results found.  Procedures Procedures (including critical care time)  Medications Ordered in ED Medications  ondansetron (ZOFRAN) injection 4 mg (has no administration in time range)  HYDROmorphone (DILAUDID) injection 0.5 mg (has no administration in time range)     Initial Impression / Assessment and Plan / ED Course  I have reviewed the triage vital signs and the nursing notes.  Pertinent labs & imaging results that were available during my care of the patient were reviewed by me and considered in my medical decision making (see chart for details).        63 year old female returning today for worsening shortness of breath.  She is known to have a new left metastatic lesion thought to be from breast cancer with pleural effusion 3 weeks ago.  Patient did have thoracentesis with 1.5 L removed and improvement in symptoms.  She has been home since 10 May  and has been on oxygen at home continuing to use nebulizer treatments for her COPD.  She states she was doing okay until 3 days ago when symptoms worsen.  She denies any infectious symptoms, lower  extremity edema or productive cough.  She is having worsening pain states it feels like it did the last time she came in.  She has significantly decreased breath sounds on the left but some mild wheezing throughout.  She is not having COVID-like symptoms at this time.  She is due to start IV chemotherapy next week.  Will repeat chest x-ray today to evaluate if fluid has reaccumulated.  Final Clinical Impressions(s) / ED Diagnoses   Final diagnoses:  None    ED Discharge Orders    None       Blanchie Dessert, MD 05/28/18 [phone]

## 2018-05-27 NOTE — Telephone Encounter (Signed)
Received call from pt stating she has been experiencing increased shortness of breath unrelieved by rest or her home O2.  Pt states it hurts for her to cough and breathe as well.  Pt states she is experiencing sharp pain bilateral sides, back, and chest.  Dr. Lindi Adie out of office, Sandi Mealy PA notified.  Per Sandi Mealy, PA pt needs to go to the emergency room for evaluation.  Pt verbalized understanding and will go to ER.

## 2018-05-27 NOTE — H&P (Signed)
History and Physical    Deborah Mcintyre HUD:[id number] DOB: [birth year] DOA: 05/27/2018  PCP: [name], MD  Patient coming from: home   Chief Complaint: shortness of breath  HPI: Deborah Mcintyre is a 63 y.o. female with medical history significant for htn, copd, smoker, breast cancer, who presents w/ above.  History left breast cancer, treated several years ago with left mastectomy, neoadjuvant chemotherapy, and radiation.  Admitted earlier this month with dyspnea, found to have left sided pleural effusion, 1.5 L drained. Covered with abx for pneumonia. Fluid analysis yielded adenocarcinoma consistent with primary breast cancer. Plan made to start palliative chemotherapy, set to start in one week. Right IJ portacath placed 2 days ago.  Patient reports that never felt much better after thoracentesis. Remained somewhat dyspneic at home, worse with ambulation, and this has slowly worsened. No acute change. Was discharged with home O2 but says this hasn't helped much so doesn't always use. No fevers, has basleline dry cough that has not worsened. No known covid exposures. Has bilateral chest/rib pain, worse with deep breath, that has persisted the last few weeks. It is not exertional. Tolerating PO. No abdominal pain or n/v.  ED Course: labs, cxr, hydromorphone  Review of Systems: As per HPI otherwise 10 point review of systems negative.    Past Medical History:  Diagnosis Date  . Anxiety   . Arthritis   . Bronchitis   . COPD (chronic obstructive pulmonary disease) ([place])   . Depression   . Hypertension   . Malignant neoplasm of upper-outer quadrant of left female breast ([place]) 01/04/2015  . Nocturia   . Numbness and tingling    toes - bilateral  . Pneumonia   . PONV (postoperative nausea and vomiting)    Nausea    Past Surgical History:  Procedure Laterality Date  . BREAST LUMPECTOMY Bilateral    x3  . BREAST LUMPECTOMY WITH RADIOACTIVE SEED LOCALIZATION Right 08/23/2015    Procedure: RIGHT BREAST LUMPECTOMY WITH RADIOACTIVE SEED LOCALIZATION;  Surgeon: Alphonsa Overall, MD;  Location: [facility];  Service: General;  Laterality: Right;  . CESAREAN SECTION     x4  . IR IMAGING GUIDED PORT INSERTION  05/25/2018  . IR REMOVAL TUN ACCESS W/ PORT W/O FL MOD SED  10/20/2016  . IR THORACENTESIS ASP PLEURAL SPACE W/IMG GUIDE  05/12/2018  . MASTECTOMY Left 08/23/2015    RIGHT BREAST LUMPECTOMY WITH RADIOACTIVE SEED LOCALIZATION,  LEFT MASTECTOMY WITH AXILLARY LYMPH NODE DISSECTION  . MASTECTOMY WITH AXILLARY LYMPH NODE DISSECTION Left 08/23/2015   Procedure: LEFT MASTECTOMY WITH AXILLARY LYMPH NODE DISSECTION;  Surgeon: Alphonsa Overall, MD;  Location: [facility];  Service: General;  Laterality: Left;  [name] MULTIPLE TOOTH EXTRACTIONS       reports that she has been smoking cigarettes. She has a 33.75 pack-year smoking history. She has never used smokeless tobacco. She reports current alcohol use. She reports that she does not use drugs.  No Known Allergies  Family History  Problem Relation Age of Onset  . Breast cancer Maternal Aunt     Prior to Admission medications   Medication Sig Start Date End Date Taking? Authorizing Provider  albuterol (PROVENTIL HFA;VENTOLIN HFA) 108 (90 Base) MCG/ACT inhaler Inhale 1-2 puffs into the lungs every 6 (six) hours as needed for wheezing or shortness of breath.    [provider]  albuterol (PROVENTIL) (2.5 MG/3ML) 0.083% nebulizer solution Take 3 mLs (2.5 mg total) by nebulization every 6 (six) hours as needed for wheezing or  shortness of breath. 05/15/18   Regalado, Belkys A, MD  ALPRAZolam (XANAX) 0.5 MG tablet Take 1 tablet (0.5 mg total) by mouth 3 (three) times daily as needed for anxiety or sleep. 05/15/18   Regalado, Belkys A, MD  amLODipine (NORVASC) 5 MG tablet Take 1 tablet (5 mg total) by mouth daily. 05/16/18   Regalado, Belkys A, MD  ferrous sulfate 325 (65 FE) MG tablet Take 1 tablet (325 mg total) by mouth daily with breakfast.  02/10/17   [name], MD  gabapentin (NEURONTIN) 300 MG capsule Take 1 capsule (300 mg total) by mouth 3 (three) times daily. 09/23/17   Nicholas Lose, MD  guaiFENesin-dextromethorphan (ROBITUSSIN DM) 100-10 MG/5ML syrup Take 5 mLs by mouth every 4 (four) hours as needed for cough. 05/15/18   Regalado, Belkys A, MD  lidocaine-prilocaine (EMLA) cream Apply to affected area once 05/19/18   Nicholas Lose, MD  metoprolol (LOPRESSOR) 50 MG tablet Take 0.5 tablets (25 mg total) by mouth 2 (two) times daily. 08/07/13   [name], MD  ondansetron (ZOFRAN) 8 MG tablet Take 1 tablet (8 mg total) by mouth every 8 (eight) hours as needed for nausea. 09/23/17   Nicholas Lose, MD  ondansetron (ZOFRAN) 8 MG tablet Take 1 tablet (8 mg total) by mouth 2 (two) times daily as needed (Nausea or vomiting). 05/19/18   Nicholas Lose, MD  oxyCODONE-acetaminophen (PERCOCET/ROXICET) 5-325 MG tablet Take 1 tablet by mouth every 8 (eight) hours as needed for moderate pain or severe pain (1 tab every 4-6 hour prn pain). 05/19/18   Nicholas Lose, MD  PARoxetine (PAXIL) 20 MG tablet Take 1 tablet (20 mg total) by mouth 2 (two) times daily. 05/15/18   Regalado, Cassie Freer, MD  PREMARIN vaginal cream INSERT 1 APPLICATORFUL VAGINALLY DAILY Patient taking differently: Place 1 Applicatorful vaginally daily.  04/19/17   Nicholas Lose, MD  prochlorperazine (COMPAZINE) 10 MG tablet Take 1 tablet (10 mg total) by mouth every 6 (six) hours as needed (Nausea or vomiting). 05/19/18   Nicholas Lose, MD    Physical Exam: Vitals:   05/27/18 1807 05/27/18 1811 05/27/18 1830 05/27/18 1900  BP: 128/79  132/89 137/76  Pulse: (!) 111  (!) 108 (!) 104  Resp: (!) 24  (!) 23 17  Temp:  98.9 F (37.2 C)    TempSrc:  Oral    SpO2: 98%  100% 99%  Weight:      Height:        Constitutional: No acute distress Head: Atraumatic Eyes: Conjunctiva clear ENM: Moist mucous membranes. Normal dentition.  Neck: Supple Respiratory: mildly tachypnic. Markedly  decreased breath sounds on left throughout. Faint rhonchi on the right Cardiovascular: tachycardia, rr, soft systolic murmur. Abdomen: Non-tender, non-distended. No masses. No rebound or guarding. Positive bowel sounds. Musculoskeletal: No joint deformity upper and lower extremities. Normal ROM, no contractures. Normal muscle tone.  Skin: No rashes, lesions, or ulcers.  Extremities: No peripheral edema. Palpable peripheral pulses. Neurologic: Alert, moving all 4 extremities. Psychiatric: Normal insight and judgement.  Lines: Right IJ port-a-cath   Labs on Admission: I have personally reviewed following labs and imaging studies  CBC: Recent Labs  Lab 05/25/18 1023 05/27/18 1653  WBC 6.6 8.6  NEUTROABS 5.2 7.8*  HGB 12.8 13.3  HCT 41.6 42.3  MCV 98.6 98.4  PLT 298 161   Basic Metabolic Panel: Recent Labs  Lab 05/27/18 1653  NA 142  K 3.5  CL 102  CO2 29  GLUCOSE 105*  BUN 10  CREATININE 0.59  CALCIUM 9.2   GFR: Estimated Creatinine Clearance: 81 mL/min (by C-G formula based on SCr of 0.59 mg/dL). Liver Function Tests: Recent Labs  Lab 05/27/18 1653  AST 10*  ALT 9  ALKPHOS 58  BILITOT 0.2*  PROT 7.1  ALBUMIN 3.5   No results for input(s): LIPASE, AMYLASE in the last 168 hours. No results for input(s): AMMONIA in the last 168 hours. Coagulation Profile: Recent Labs  Lab 05/25/18 1023  INR 1.0   Cardiac Enzymes: No results for input(s): CKTOTAL, CKMB, CKMBINDEX, TROPONINI in the last 168 hours. BNP (last 3 results) No results for input(s): PROBNP in the last 8760 hours. HbA1C: No results for input(s): HGBA1C in the last 72 hours. CBG: No results for input(s): GLUCAP in the last 168 hours. Lipid Profile: No results for input(s): CHOL, HDL, LDLCALC, TRIG, CHOLHDL, LDLDIRECT in the last 72 hours. Thyroid Function Tests: No results for input(s): TSH, T4TOTAL, FREET4, T3FREE, THYROIDAB in the last 72 hours. Anemia Panel: No results for input(s):  VITAMINB12, FOLATE, FERRITIN, TIBC, IRON, RETICCTPCT in the last 72 hours. Urine analysis:    Component Value Date/Time   COLORURINE YELLOW 12/06/2014 2207   APPEARANCEUR CLOUDY (A) 12/06/2014 2207   LABSPEC 1.025 12/06/2014 2207   PHURINE 5.5 12/06/2014 2207   GLUCOSEU NEGATIVE 12/06/2014 2207   HGBUR MODERATE (A) 12/06/2014 2207   BILIRUBINUR NEGATIVE 12/06/2014 2207   KETONESUR NEGATIVE 12/06/2014 2207   PROTEINUR NEGATIVE 12/06/2014 2207   UROBILINOGEN 1.0 07/30/2013 1527   NITRITE POSITIVE (A) 12/06/2014 2207   LEUKOCYTESUR SMALL (A) 12/06/2014 2207    Radiological Exams on Admission: Dg Chest Port 1 View  Result Date: 05/27/2018 CLINICAL DATA:  Chest pain and shortness of breath EXAM: PORTABLE CHEST 1 VIEW COMPARISON:  May 15, 2018 FINDINGS: There is complete opacification on the left without appreciable shift of heart and mediastinum toward the left. The right lung is clear. Heart appears mildly enlarged with visualized pulmonary vascularity normal. Note that the pulmonary vascular on the left cannot be assessed. Port-A-Cath tip is at the cavoatrial junction. No pneumothorax. No bone lesions. No adenopathy in areas for adenopathy can be assessed. IMPRESSION: Complete opacification on the left. Question combination of consolidation and pleural effusion. Note that the trachea remains essentially midline. Heart is not appear shifted toward the left. Right lung essentially clear. Heart appears prominent although difficult to assess due to the diffuse opacification on the left side. Electronically Signed   By: Lowella Grip III M.D.   On: 05/27/2018 17:35    EKG: Independently reviewed. Sinus tach, rbb  Assessment/Plan Active Problems:   Breast cancer of upper-outer quadrant of left female breast (HCC)   Malignant pleural effusion   Hypertension   COPD (chronic obstructive pulmonary disease) ([facility])   Cigarette smoker    # Malignant pleural effusion - recurrent, consistent w/  recurrence of breast cancer. With O2 requirement (dips to upper 80s off oxygen). Causing tachypnea, but breathing comfortably during my exam, no distress. No tracheal deviation on CXR. Think unlikely superimposed infection, will hold on abx. - continue O2 - likely discussion w/ oncology and VIR in morning regarding best approach (catheter placement, plueurodesis, etc.) - pain control: home percocet and gabapentin, add hydromorphone prn  # Anxiety - cont home xanax and paxil  # copd - cont home albuterol prn  # htn - here bp wnl - cont home amlodipine and metoprolol  # smoker - nicotine patch  DVT prophylaxis: scds, holding on pharmacologic until  decision made regarding procedure Code Status: full  Family Communication: mother mary foster [phone]  Disposition Plan: tbd  Consults called: none  Admission status: med/surg    Desma Maxim MD [facility] Pager [phone]  If 7PM-7AM, please contact night-coverage [email or web address] Password [facility]  05/27/2018, 8:48 PM

## 2018-05-27 NOTE — ED Notes (Signed)
MD at bedside. 

## 2018-05-27 NOTE — ED Triage Notes (Signed)
Pt from home come in from EMS, Pt called EMS d/t SOB. Pt recently d/c from [facility] after hospitalization for SOB and pt has fluid removed from lungs, Pt has hx of COPD, Hx of breast cancer and was informed it had came back during recent hospital admission. Per EMS wheezing on right and diminished throughout and labored breahting. Pt received albuterol neb and solumederol 125 mg via IV 20 g R wrist.

## 2018-05-28 ENCOUNTER — Encounter (HOSPITAL_COMMUNITY): Payer: Self-pay | Admitting: General Practice

## 2018-05-28 ENCOUNTER — Inpatient Hospital Stay (HOSPITAL_COMMUNITY): Payer: Medicare Other

## 2018-05-28 LAB — BASIC METABOLIC PANEL
Anion gap: 8 (ref 5–15)
BUN: 12 mg/dL (ref 8–23)
CO2: 28 mmol/L (ref 22–32)
Calcium: 8.1 mg/dL — ABNORMAL LOW (ref 8.9–10.3)
Chloride: 98 mmol/L (ref 98–111)
Creatinine, Ser: 0.69 mg/dL (ref 0.44–1.00)
GFR calc Af Amer: >60 mL/min (ref >60)
GFR calc non Af Amer: >60 mL/min (ref >60)
Glucose, Bld: 487 mg/dL — ABNORMAL HIGH (ref 70–99)
Potassium: 3.8 mmol/L (ref 3.5–5.1)
Sodium: 134 mmol/L — ABNORMAL LOW (ref 135–145)

## 2018-05-28 LAB — CBC
HCT: 37.2 % (ref 36.0–46.0)
Hemoglobin: 11.4 g/dL — ABNORMAL LOW (ref 12.0–15.0)
MCH: 30.8 pg (ref 26.0–34.0)
MCHC: 30.6 g/dL (ref 30.0–36.0)
MCV: 100.5 fL — ABNORMAL HIGH (ref 80.0–100.0)
Platelets: 290 10*3/uL (ref 150–400)
RBC: 3.7 MIL/uL — ABNORMAL LOW (ref 3.87–5.11)
RDW: 12.7 % (ref 11.5–15.5)
WBC: 5.2 10*3/uL (ref 4.0–10.5)
nRBC: 0 % (ref 0.0–0.2)

## 2018-05-28 MED ORDER — ONDANSETRON HCL 4 MG/2ML IJ SOLN
4.0000 mg | Freq: Four times a day (QID) | INTRAMUSCULAR | Status: DC | PRN
  Administered 2018-05-28 – 2018-05-29 (×4): 4 mg via INTRAVENOUS
  Filled 2018-05-28 (×4): qty 2

## 2018-05-28 MED ORDER — LIDOCAINE HCL 1 % IJ SOLN
INTRAMUSCULAR | Status: AC
  Filled 2018-05-28: qty 10

## 2018-05-28 MED ORDER — ORAL CARE MOUTH RINSE
15.0000 mL | Freq: Two times a day (BID) | OROMUCOSAL | Status: DC
  Administered 2018-05-28 – 2018-05-29 (×2): 15 mL via OROMUCOSAL

## 2018-05-28 NOTE — Progress Notes (Signed)
PROGRESS NOTE    Deborah Mcintyre  NIO:[id number] DOB: [birth year] DOA: 05/27/2018 PCP: [name], MD   Brief Narrative: Patient is a 63 year old female with history of hypertension, COPD, smoker, breast cancer who presents the emergency department with complaints of shortness of breath.  She has history of breast cancer, status post left-sided mastectomy, neoadjuvant chemotherapy and radiation.  She has history of left-sided pleural effusion due to breast cancer.  Chest x-ray done in the emergency department showed opacification of the left lung due to pleural effusion with +/- consolidation.  She underwent ultrasound-guided thoracentesis today by IR.  Assessment & Plan:   Active Problems:   Breast cancer of upper-outer quadrant of left female breast (HCC)   Malignant pleural effusion   Hypertension   COPD (chronic obstructive pulmonary disease) ([facility])   Cigarette smoker   Malignant left-sided pleural effusion:She underwent ultrasound-guided thoracentesis today by IR.Yielded 1.1 liters of hazy gold fluid.Chest x-ray done in the emergency department showed opacification of the left lung due to pleural effusion with +/- consolidation. She is currently on oxygen at home.  Continue supplemental oxygen as needed. I do not suspect she has pneumonia. She is afebrile  white cell,white cell stable.  She was treated for suspicion of pneumonia on her last admission earlier this month.  She also underwent left-sided pleural effusion on that admission, cytology showed adenocarcinoma.     Left breast cancer: Follows with Dr. Lindi Adie.  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  Has Port-A-Cath on the right chest.  Plan is to start on palliative chemotherapy.  She was treated with left mastectomy, neoadjuvant chemotherapy and radiation in the past.  COPD: Currently stable.  Continue inhalers.  Continue bronchodilators as  needed.  Smoker: Still smokes 10 cigarettes a day.  Counseled for cessation.  Continue nicotine patch  Anxiety: Continue Xanax and Paxil from home.  Hypertension: Currently blood stable.  Continue amlodipine and metoprolol         DVT prophylaxis:scD Code Status: Full Family Communication: None present at the bedside Disposition Plan: Likely home tomorrow   Consultants: None  Procedures: Thoracentesis  Antimicrobials: None Anti-infectives (From admission, onward)   None      Subjective: Patient seen and examined the bedside this morning.  Complains of some  shortness of breath.  Waiting for thoracocentesis.  Denies any chest pain or cough.  Objective: Vitals:   05/28/18 1235 05/28/18 1240 05/28/18 1246 05/28/18 1255  BP: (!) 150/25 134/84 140/89 (!) 154/89  Pulse:      Resp:      Temp:      TempSrc:      SpO2:      Weight:      Height:        Intake/Output Summary (Last 24 hours) at 05/28/2018 1343 Last data filed at 05/28/2018 0820 Gross per 24 hour  Intake 0 ml  Output -  Net 0 ml   Filed Weights   05/27/18 1629  Weight: 83.5 kg    Examination:  General exam: Not in distress,average built HEENT:PERRL,Oral mucosa moist, Ear/Nose normal on gross exam Respiratory system: No air entry on the left side Cardiovascular system: S1 & S2 heard, RRR. No JVD, murmurs, rubs, gallops or clicks. No pedal edema.  Right sided Port-A-Cath Left mastectomy Gastrointestinal system: Abdomen is nondistended, soft and nontender. No organomegaly or masses felt. Normal bowel sounds heard. Central nervous system: Alert and oriented. No focal neurological deficits. Extremities: No edema,  no clubbing ,no cyanosis, distal peripheral pulses palpable. Skin: No rashes, lesions or ulcers,no icterus ,no pallor MSK: Normal muscle bulk,tone ,power Psychiatry: Judgement and insight appear normal. Mood & affect appropriate.     Data Reviewed: I have personally reviewed following  labs and imaging studies  CBC: Recent Labs  Lab 05/25/18 1023 05/27/18 1653 05/28/18 0601  WBC 6.6 8.6 5.2  NEUTROABS 5.2 7.8*  --   HGB 12.8 13.3 11.4*  HCT 41.6 42.3 37.2  MCV 98.6 98.4 100.5*  PLT 298 280 329   Basic Metabolic Panel: Recent Labs  Lab 05/27/18 1653 05/28/18 0601  NA 142 134*  K 3.5 3.8  CL 102 98  CO2 29 28  GLUCOSE 105* 487*  BUN 10 12  CREATININE 0.59 0.69  CALCIUM 9.2 8.1*   GFR: Estimated Creatinine Clearance: 81 mL/min (by C-G formula based on SCr of 0.69 mg/dL). Liver Function Tests: Recent Labs  Lab 05/27/18 1653  AST 10*  ALT 9  ALKPHOS 58  BILITOT 0.2*  PROT 7.1  ALBUMIN 3.5   No results for input(s): LIPASE, AMYLASE in the last 168 hours. No results for input(s): AMMONIA in the last 168 hours. Coagulation Profile: Recent Labs  Lab 05/25/18 1023  INR 1.0   Cardiac Enzymes: No results for input(s): CKTOTAL, CKMB, CKMBINDEX, TROPONINI in the last 168 hours. BNP (last 3 results) No results for input(s): PROBNP in the last 8760 hours. HbA1C: No results for input(s): HGBA1C in the last 72 hours. CBG: No results for input(s): GLUCAP in the last 168 hours. Lipid Profile: No results for input(s): CHOL, HDL, LDLCALC, TRIG, CHOLHDL, LDLDIRECT in the last 72 hours. Thyroid Function Tests: No results for input(s): TSH, T4TOTAL, FREET4, T3FREE, THYROIDAB in the last 72 hours. Anemia Panel: No results for input(s): VITAMINB12, FOLATE, FERRITIN, TIBC, IRON, RETICCTPCT in the last 72 hours. Sepsis Labs: No results for input(s): PROCALCITON, LATICACIDVEN in the last 168 hours.  Recent Results (from the past 240 hour(s))  SARS Coronavirus 2 (CEPHEID - Performed in [facility] hospital lab), Hosp Order     Status: None   Collection Time: 05/27/18  4:53 PM  Result Value Ref Range Status   SARS Coronavirus 2 NEGATIVE NEGATIVE Final    Comment: (NOTE) If result is NEGATIVE SARS-CoV-2 target nucleic acids are NOT DETECTED. The SARS-CoV-2  RNA is generally detectable in upper and lower  respiratory specimens during the acute phase of infection. The lowest  concentration of SARS-CoV-2 viral copies this assay can detect is 250  copies / mL. A negative result does not preclude SARS-CoV-2 infection  and should not be used as the sole basis for treatment or other  patient management decisions.  A negative result may occur with  improper specimen collection / handling, submission of specimen other  than nasopharyngeal swab, presence of viral mutation(s) within the  areas targeted by this assay, and inadequate number of viral copies  (<250 copies / mL). A negative result must be combined with clinical  observations, patient history, and epidemiological information. If result is POSITIVE SARS-CoV-2 target nucleic acids are DETECTED. The SARS-CoV-2 RNA is generally detectable in upper and lower  respiratory specimens dur ing the acute phase of infection.  Positive  results are indicative of active infection with SARS-CoV-2.  Clinical  correlation with patient history and other diagnostic information is  necessary to determine patient infection status.  Positive results do  not rule out bacterial infection or co-infection with other viruses. If result is PRESUMPTIVE  POSTIVE SARS-CoV-2 nucleic acids MAY BE PRESENT.   A presumptive positive result was obtained on the submitted specimen  and confirmed on repeat testing.  While 2019 novel coronavirus  (SARS-CoV-2) nucleic acids may be present in the submitted sample  additional confirmatory testing may be necessary for epidemiological  and / or clinical management purposes  to differentiate between  SARS-CoV-2 and other Sarbecovirus currently known to infect humans.  If clinically indicated additional testing with an alternate test  methodology [phone]) is advised. The SARS-CoV-2 RNA is generally  detectable in upper and lower respiratory sp ecimens during the acute  phase of  infection. The expected result is Negative. Fact Sheet for Patients:  StrictlyIdeas.no Fact Sheet for Healthcare Providers: BankingDealers.co.za This test is not yet approved or cleared by the [place] FDA and has been authorized for detection and/or diagnosis of SARS-CoV-2 by FDA under an Emergency Use Authorization (EUA).  This EUA will remain in effect (meaning this test can be used) for the duration of the COVID-19 declaration under Section 564(b)(1) of the Act, 21 U.S.C. section 360bbb-3(b)(1), unless the authorization is terminated or revoked sooner. Performed at [facility], [place] [address], [place], [place] [address]          Radiology Studies: Dg Chest 1 View  Result Date: 05/28/2018 CLINICAL DATA:  Post thoracentesis. EXAM: CHEST  1 VIEW COMPARISON:  Chest x-ray dated 05/27/2018. FINDINGS: Improved aeration of the LEFT hemithorax although large opacity persists. No pneumothorax seen. RIGHT lung is clear. RIGHT chest wall Port-A-Cath appears stable in position with tip at the level of the mid/lower SVC. IMPRESSION: Slightly improved aeration of the LEFT hemithorax status post thoracentesis, although large opacity persists. No pneumothorax seen. Electronically Signed   By: Franki Cabot M.D.   On: 05/28/2018 13:12   Dg Chest Port 1 View  Result Date: 05/27/2018 CLINICAL DATA:  Chest pain and shortness of breath EXAM: PORTABLE CHEST 1 VIEW COMPARISON:  May 15, 2018 FINDINGS: There is complete opacification on the left without appreciable shift of heart and mediastinum toward the left. The right lung is clear. Heart appears mildly enlarged with visualized pulmonary vascularity normal. Note that the pulmonary vascular on the left cannot be assessed. Port-A-Cath tip is at the cavoatrial junction. No pneumothorax. No bone lesions. No adenopathy in areas for adenopathy can be assessed. IMPRESSION: Complete  opacification on the left. Question combination of consolidation and pleural effusion. Note that the trachea remains essentially midline. Heart is not appear shifted toward the left. Right lung essentially clear. Heart appears prominent although difficult to assess due to the diffuse opacification on the left side. Electronically Signed   By: Lowella Grip III M.D.   On: 05/27/2018 17:35        Scheduled Meds: . amLODipine  5 mg Oral Daily  . gabapentin  300 mg Oral TID  . lidocaine      . mouth rinse  15 mL Mouth Rinse BID  . metoprolol tartrate  25 mg Oral BID  . nicotine  14 mg Transdermal Daily  . PARoxetine  20 mg Oral BID   Continuous Infusions: . dextrose 5 % and 0.45% NaCl 100 mL/hr at 05/27/18 2315     LOS: 1 day    Time spent: 35 mins.More than 50% of that time was spent in counseling and/or coordination of care.      Shelly Coss, MD [facility] Pager [phone]  If 7PM-7AM, please contact night-coverage [email or web address] Password [facility] 05/28/2018, 1:43 PM

## 2018-05-28 NOTE — Procedures (Addendum)
PROCEDURE SUMMARY:  Successful image-guided left thoracentesis. Yielded 1.1 liters of hazy gold fluid. Procedure was stopped after 1.1 L per patient request due to patient coughing. Patient tolerated procedure well. No immediate complications. EBL = 5 mL.  Specimen was not sent for labs. CXR ordered.  Earley Abide PA-C 05/28/2018 12:55 PM

## 2018-05-29 ENCOUNTER — Inpatient Hospital Stay (HOSPITAL_COMMUNITY): Payer: Medicare Other

## 2018-05-29 MED ORDER — SODIUM CHLORIDE 0.9% FLUSH
10.0000 mL | INTRAVENOUS | Status: DC | PRN
  Administered 2018-05-29: 10 mL
  Filled 2018-05-29: qty 40

## 2018-05-29 MED ORDER — LIDOCAINE HCL 1 % IJ SOLN
INTRAMUSCULAR | Status: AC
  Filled 2018-05-29: qty 10

## 2018-05-29 MED ORDER — ENSURE ENLIVE PO LIQD: 237.0000 mL | ORAL | Status: DC

## 2018-05-29 MED ORDER — HEPARIN SOD (PORK) LOCK FLUSH 100 UNIT/ML IV SOLN
500.0000 [IU] | INTRAVENOUS | Status: AC | PRN
  Administered 2018-05-29: 500 [IU]

## 2018-05-29 MED ORDER — LIDOCAINE 5 % EX PTCH
1.0000 | MEDICATED_PATCH | CUTANEOUS | Status: DC
  Administered 2018-05-29: 1 via TRANSDERMAL
  Filled 2018-05-29: qty 1

## 2018-05-29 MED ORDER — LIDOCAINE 5 % EX PTCH: 1.0000 | MEDICATED_PATCH | CUTANEOUS | 0 refills | Status: DC

## 2018-05-29 NOTE — Progress Notes (Signed)
Pt leaving this afternoon with her spouse. Alert and oriented. Discharge instructions given/explained with pt verbalizing understanding. Pt aware of followup appointments and to schedule for the drainage bag for left pleural effusion.

## 2018-05-29 NOTE — Progress Notes (Signed)
Received consult to deaccess port for discharge home. Upon flushing the port, it was noted that the needle was not in place. Abigail Butts, RN had just given the patient a dose of IV dilaudid and it leaked out of the dressing onto patient's shirt (with IV RN present). When dressing was taken off, port a cath needle was barely in the skin. Any medicine just leaked out of the dressing. Port then reaccessed so could be flushed with heparin and another dose of dilaudid was given (since patient did not receive first dose) before flushing the port with heparin

## 2018-05-29 NOTE — Progress Notes (Signed)
Initial Nutrition Assessment  RD working remotely.   DOCUMENTATION CODES:   Not applicable  INTERVENTION:  - will order Ensure Enlive once/day, each supplement provides 350 kcal and 20 grams of protein. - continue to encourage PO intakes.    NUTRITION DIAGNOSIS:   Increased nutrient needs related to chronic illness, cancer and cancer related treatments as evidenced by estimated needs.  GOAL:   Patient will meet greater than or equal to 90% of their needs  MONITOR:   PO intake, Supplement acceptance, Labs, Weight trends  REASON FOR ASSESSMENT:   Malnutrition Screening Tool  ASSESSMENT:   63 year old female with history of hypertension, COPD, tobacco abuse, and breast cancer s/p L mastectomy, neoadjuvant chemo and radiation. She presented to the ED with complaints of SOB. She has history of left-sided pleural effusion d/t breast cancer. CXR done in the ED showed opacification of the L lung d/t pleural effusion with +/- consolidation. She underwent ultrasound-guided thoracentesis by IR on 5/23.  Per RN flow sheet, patient consumed 0% of breakfast, 75% of lunch, and 90% of dinner yesterday and 100% of breakfast today. Patient was likely unable to eat breakfast yesterday AM as patient had L thoracenteses with 1.1L removed. Procedure then had to be stopped d/t ongoing coughing. Repeat thoracentesis was done earlier today with 1.6L removed (total of 2.7L).   Per chart review, weight on admission (5/22) was 184 lb and last most recent weight was 09/23/17 when she weighed 200 lb. This indicates 16 lb weight loss (8% body weight) in 8 months; not significant for time frame.   Discharge order and discharge summary placed in chart a few minutes ago. Plan is for d/c home.   Medications reviewed. Labs reviewed; Na: 134 mmol/l, Ca: 8.1 mg/dl.      NUTRITION - FOCUSED PHYSICAL EXAM:  unable to perform at this time.   Diet Order:   Diet Order            Diet - low sodium heart  healthy        Diet Heart Room service appropriate? Yes; Fluid consistency: Thin  Diet effective now              EDUCATION NEEDS:   No education needs have been identified at this time  Skin:  Skin Assessment: Reviewed RN Assessment  Last BM:  5/21 (PTA)  Height:   Ht Readings from Last 1 Encounters:  05/27/18 5\' 7"  (1.702 m)    Weight:   Wt Readings from Last 1 Encounters:  05/27/18 83.5 kg    Ideal Body Weight:  61.4 kg  BMI:  Body mass index is 28.82 kg/m.  Estimated Nutritional Needs:   Kcal:  2100-2300 kcal  Protein:  100-115 grams  Fluid:  >/= 2 L/day      Jarome Matin, MS, RD, LDN, [facility] Inpatient Clinical Dietitian Pager # [phone] After hours/weekend pager # [phone]

## 2018-05-29 NOTE — Procedures (Signed)
Interventional Radiology Procedure Note  Procedure:  US guided left thora.  Repeated from yesterday.  Today 1.6L removed.   Findings:  Patient has significant coughing as fluid drains.  This is prohibitive for higher volume drainage, as unwilling to proceed.  Coughing is painful.   This may be prohibitive to draining enough fluid from her chest to allow improved O2 requirement.   There is sig residual on the final image after 1.6 L  Complications: None  Recommendations:  CXR now Routine wound care  Signed,  Dulcy Fanny. Earleen Newport, DO

## 2018-05-29 NOTE — Discharge Summary (Signed)
Physician Discharge Summary  Deborah Mcintyre PPI:[id number] DOB: [birth year] DOA: 05/27/2018  PCP: [name], MD  Admit date: 05/27/2018 Discharge date: 05/29/2018  Admitted From: Home Disposition:  Home  Discharge Condition:Stable CODE STATUS:FULL Diet recommendation: Heart Healthy   Brief/Interim Summary: Patient is a 63 year old female with history of hypertension, COPD, smoker, breast cancer who presents the emergency department with complaints of shortness of breath.  She has history of breast cancer, status post left-sided mastectomy, neoadjuvant chemotherapy and radiation.  She has history of left-sided pleural effusion due to breast cancer.  Chest x-ray done in the emergency department showed opacification of the left lung due to pleural effusion with +/- consolidation.  She underwent ultrasound-guided thoracentesis today by IR twice.  There was no suspicion for pneumonia.  She is already on 2 L of oxygen at home on baseline.  She will follow-up with IR for possible Pleurx catheter placement.  Following problems were addressed during her hospitalization:  Malignant left-sided pleural effusion:She underwent ultrasound-guided thoracentesis twice by IR.Removal of total 2.7 L..Chest x-ray done in the emergency department showed opacification of the left lung due to pleural effusion with +/- consolidation. She is currently on oxygen at home.  Continue supplemental oxygen as needed. I do not suspect she has pneumonia. She is afebrile  white cell,white cell stable. She was treated for suspicion of pneumonia on her last admission earlier this month.  She also underwent left-sided pleural effusion on that admission, cytology showed adenocarcinoma.   We discussed with interventional radiology about possibility of putting a Pleurx catheter.  It was not possible because of the  weekend and the next possible date will only be Tuesday.  Since she already had 2 thoracenteses, yesterday and today,  she will follow-up with IR as an outpatient for Pleurx catheter placement. I will also notify Dr. Lindi Adie tomorrow about this plan.  Left breast cancer: Follows with Dr. Lindi Adie.  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  Has Port-A-Cath on the right chest.  Plan is to start on palliative chemotherapy.  She was treated with left mastectomy, neoadjuvant chemotherapy and radiation in the past.  COPD: Currently stable.  Continue inhalers.  Continue bronchodilators as needed.  Smoker: Still smokes 10 cigarettes a day.  Counseled for cessation. C  Anxiety: Continue Xanax and Paxil from home.  Hypertension: Currently blood stable.  Continue amlodipine and metoprolol   Discharge Diagnoses:  Active Problems:   Breast cancer of upper-outer quadrant of left female breast (HCC)   Malignant pleural effusion   Hypertension   COPD (chronic obstructive pulmonary disease) (HCC)   Cigarette smoker    Discharge Instructions  Discharge Instructions    Diet - low sodium heart healthy   Complete by:  As directed    Discharge instructions   Complete by:  As directed    1)Please follow up with interventional radiology for possible PleurX cathter placement.You will  be called for appointment. 2)Follow up with oncologist in a week. 2)Continue your home meds.   Increase activity slowly   Complete by:  As directed      Allergies as of 05/29/2018   No Known Allergies     Medication List    STOP taking these medications   guaiFENesin-dextromethorphan 100-10 MG/5ML syrup Commonly known as:  ROBITUSSIN DM     TAKE these medications   albuterol 108 (90 Base) MCG/ACT inhaler Commonly known as:  VENTOLIN HFA Inhale 1-2 puffs into the lungs every 6 (  six) hours as needed for wheezing or shortness of breath.   albuterol (2.5 MG/3ML) 0.083% nebulizer solution Commonly known as:  PROVENTIL Take 3 mLs (2.5 mg total)  by nebulization every 6 (six) hours as needed for wheezing or shortness of breath.   ALPRAZolam 0.5 MG tablet Commonly known as:  XANAX Take 1 tablet (0.5 mg total) by mouth 3 (three) times daily as needed for anxiety or sleep.   amLODipine 5 MG tablet Commonly known as:  NORVASC Take 1 tablet (5 mg total) by mouth daily.   ferrous sulfate 325 (65 FE) MG tablet Take 1 tablet (325 mg total) by mouth daily with breakfast.   gabapentin 300 MG capsule Commonly known as:  NEURONTIN Take 1 capsule (300 mg total) by mouth 3 (three) times daily.   ibuprofen 200 MG tablet Commonly known as:  ADVIL Take 800 mg by mouth every 6 (six) hours as needed for moderate pain.   lidocaine 5 % Commonly known as:  LIDODERM Place 1 patch onto the skin daily. Remove & Discard patch within 12 hours or as directed by MD   lidocaine-prilocaine cream Commonly known as:  EMLA Apply to affected area once What changed:    how much to take  how to take this  when to take this  reasons to take this  additional instructions   metoprolol tartrate 50 MG tablet Commonly known as:  LOPRESSOR Take 0.5 tablets (25 mg total) by mouth 2 (two) times daily.   ondansetron 8 MG tablet Commonly known as:  ZOFRAN Take 1 tablet (8 mg total) by mouth every 8 (eight) hours as needed for nausea. What changed:  Another medication with the same name was removed. Continue taking this medication, and follow the directions you see here.   oxyCODONE-acetaminophen 5-325 MG tablet Commonly known as:  PERCOCET/ROXICET Take 1 tablet by mouth every 8 (eight) hours as needed for moderate pain or severe pain (1 tab every 4-6 hour prn pain). What changed:    when to take this  reasons to take this   PARoxetine 20 MG tablet Commonly known as:  PAXIL Take 1 tablet (20 mg total) by mouth 2 (two) times daily.   Premarin vaginal cream Generic drug:  conjugated estrogens INSERT 1 APPLICATORFUL VAGINALLY DAILY What changed:   See the new instructions.   prochlorperazine 10 MG tablet Commonly known as:  COMPAZINE Take 1 tablet (10 mg total) by mouth every 6 (six) hours as needed (Nausea or vomiting).      Follow-up Information    [name], MD. Schedule an appointment as soon as possible for a visit in 1 week(s).   Specialty:  Cardiology Contact information: [place] [address] [phone]          No Known Allergies  Consultations:  IR   Procedures/Studies: Dg Chest 1 View  Result Date: 05/28/2018 CLINICAL DATA:  Post thoracentesis. EXAM: CHEST  1 VIEW COMPARISON:  Chest x-ray dated 05/27/2018. FINDINGS: Improved aeration of the LEFT hemithorax although large opacity persists. No pneumothorax seen. RIGHT lung is clear. RIGHT chest wall Port-A-Cath appears stable in position with tip at the level of the mid/lower SVC. IMPRESSION: Slightly improved aeration of the LEFT hemithorax status post thoracentesis, although large opacity persists. No pneumothorax seen. Electronically Signed   By: Franki Cabot M.D.   On: 05/28/2018 13:12   Dg Chest 1 View  Result Date: 05/12/2018 CLINICAL DATA:  Status post left thoracentesis. EXAM: CHEST  1 VIEW  COMPARISON:  CT chest from same day.  Chest x-ray from yesterday. FINDINGS: Stable cardiomediastinal silhouette with silhouetting of the left heart border. Interval decrease in size of now moderate loculated left pleural effusion. Dense consolidation in the left upper lobe as seen on earlier chest CT. Mildly improved aeration of the left lung. The right lung remains clear. No pneumothorax. No acute osseous abnormality. IMPRESSION: 1. Decrease in size of now moderate loculated left pleural effusion with mildly improved left lung aeration. No pneumothorax. 2. Persistent dense consolidation in the left upper lobe. Electronically Signed   By: Titus Dubin M.D.   On: 05/12/2018 13:23   Dg Chest 2 View  Result Date: 05/15/2018 CLINICAL DATA:   SOB (shortness of breath) EXAM: CHEST - 2 VIEW COMPARISON:  Radiograph 05/12/2018 FINDINGS: Normal cardiac silhouette. Opacification of the LEFT hemithorax with volume loss again noted. There is some improved aeration to the LEFT lung compared to prior. Large effusion noted. RIGHT lung clear. No pneumothorax. IMPRESSION: 1. Some improved aeration to the LEFT lung. Persistent dense opacification of the entire LEFT hemithorax with volume loss and effusion. 2. RIGHT lung clear. Electronically Signed   By: Suzy Bouchard M.D.   On: 05/15/2018 06:05   Ct Chest W Contrast  Result Date: 05/12/2018 CLINICAL DATA:  Left side chest pain, shortness of breath EXAM: CT CHEST WITH CONTRAST TECHNIQUE: Multidetector CT imaging of the chest was performed during intravenous contrast administration. CONTRAST:  62mL OMNIPAQUE IOHEXOL 300 MG/ML  SOLN COMPARISON:  Chest x-ray 05/11/2018 FINDINGS: Cardiovascular: Heart is normal size. Scattered aortic calcifications. Aorta is normal size. Mediastinum/Nodes: Enlarged mediastinal lymph nodes. Right paratracheal lymph node has a short axis diameter of 1.7 cm on image 56. Prevascular/AP window lymph nodal mass has a short axis diameter of 1.7 cm. No axillary adenopathy or right hilar adenopathy. Difficult to assess the left hilum due to airspace disease and fluid. Lungs/Pleura: Near complete opacification of the left hemithorax due to large partially loculated left effusion and airspace disease. Dense consolidation in the left upper lobe. Compressive atelectasis in the left lower lobe. No effusion on the right. Ground-glass nodule posteriorly in the right lower lobe measures 11 mm. Upper Abdomen: Imaging into the upper abdomen shows no acute findings. Musculoskeletal: Prior left mastectomy.  No acute bony abnormality. IMPRESSION: Dense consolidative airspace disease in the left upper lobe with large partially loculated left pleural effusion and compressive atelectasis in the left lower  lobe. This could be related to pneumonia. A postobstructive process cannot be excluded. Mediastinal adenopathy. Prior left mastectomy. Electronically Signed   By: Rolm Baptise M.D.   On: 05/12/2018 01:25   Dg Chest Port 1 View  Result Date: 05/29/2018 CLINICAL DATA:  Status post thoracentesis EXAM: PORTABLE CHEST 1 VIEW COMPARISON:  Chest radiograph May 28, 2018. FINDINGS: No pneumothorax. There remains moderate pleural effusion on the left. There is opacification of much of the remaining left lung, likely due to a combination of airspace consolidation and a degree of re-expansion pulmonary edema. The right lung is clear. Heart is mildly enlarged with visualized pulmonary vascularity unremarkable. Port-A-Cath tip is in the superior vena cava. No bone lesions. Surgical clips are noted in the left axilla. IMPRESSION: No pneumothorax. There remains residual effusion on the left, much less than prior to thoracentesis. Opacification throughout much of the remaining left lung is likely due to a combination of underlying airspace opacity and re-expansion pulmonary edema. Right lung clear. Stable cardiac silhouette. Electronically Signed   By: Gwyndolyn Saxon  Jasmine December III M.D.   On: 05/29/2018 11:52   Dg Chest Port 1 View  Result Date: 05/27/2018 CLINICAL DATA:  Chest pain and shortness of breath EXAM: PORTABLE CHEST 1 VIEW COMPARISON:  May 15, 2018 FINDINGS: There is complete opacification on the left without appreciable shift of heart and mediastinum toward the left. The right lung is clear. Heart appears mildly enlarged with visualized pulmonary vascularity normal. Note that the pulmonary vascular on the left cannot be assessed. Port-A-Cath tip is at the cavoatrial junction. No pneumothorax. No bone lesions. No adenopathy in areas for adenopathy can be assessed. IMPRESSION: Complete opacification on the left. Question combination of consolidation and pleural effusion. Note that the trachea remains essentially midline.  Heart is not appear shifted toward the left. Right lung essentially clear. Heart appears prominent although difficult to assess due to the diffuse opacification on the left side. Electronically Signed   By: Lowella Grip III M.D.   On: 05/27/2018 17:35   Dg Chest Port 1 View  Result Date: 05/11/2018 CLINICAL DATA:  Shortness of breath EXAM: PORTABLE CHEST 1 VIEW COMPARISON:  02/04/2017 FINDINGS: There is near complete opacification of the left hemithorax. There is no significant shift of the mediastinum to the right. The cardiac silhouette is suboptimally evaluated secondary to the appearance of the left hemithorax. No definite pneumothorax. The right lung field appears essentially clear. Surgical clips are noted in the left axilla. IMPRESSION: Near complete opacification of the left hemithorax which may be secondary to a large left-sided pleural effusion and/or confluent airspace disease. An underlying mass cannot be excluded. Electronically Signed   By: Constance Holster M.D.   On: 05/11/2018 21:57   Ir Imaging Guided Port Insertion  Result Date: 05/25/2018 CLINICAL DATA:  Recurrent inflammatory left breast carcinoma, needs durable venous access for chemotherapy EXAM: TUNNELED PORT CATHETER PLACEMENT WITH ULTRASOUND AND FLUOROSCOPIC GUIDANCE FLUOROSCOPY TIME:  0.1 minute; 45  uGym2 DAP ANESTHESIA/SEDATION: Intravenous Fentanyl 175mcg and Versed 3mg  were administered as conscious sedation during continuous monitoring of the patient's level of consciousness and physiological / cardiorespiratory status by the radiology RN, with a total moderate sedation time of 18 minutes. TECHNIQUE: The procedure, risks, benefits, and alternatives were explained to the patient. Questions regarding the procedure were encouraged and answered. The patient understands and consents to the procedure. As antibiotic prophylaxis, cefazolin 2 g was ordered pre-procedure and administered intravenously within one hour of incision.  Patency of the right IJ vein was confirmed with ultrasound with image documentation. An appropriate skin site was determined. Skin site was marked. Region was prepped using maximum barrier technique including cap and mask, sterile gown, sterile gloves, large sterile sheet, and Chlorhexidine as cutaneous antisepsis. The region was infiltrated locally with 1% lidocaine. Under real-time ultrasound guidance, the right IJ vein was accessed with a 21 gauge micropuncture needle; the needle tip within the vein was confirmed with ultrasound image documentation. Needle was exchanged over a 018 guidewire for transitional dilator which allowed passage of the [facility] wire into the IVC. Over this, the transitional dilator was exchanged for a 5 [place] MPA catheter. A small incision was made on the right anterior chest wall and a subcutaneous pocket fashioned. The power-injectable port was positioned and its catheter tunneled to the right IJ dermatotomy site. The MPA catheter was exchanged over an Amplatz wire for a peel-away sheath, through which the port catheter, which had been trimmed to the appropriate length, was advanced and positioned under fluoroscopy with its tip at the cavoatrial junction.  Spot chest radiograph confirms good catheter position and no pneumothorax. The pocket was closed with deep interrupted and subcuticular continuous 3-0 Monocryl sutures. The port was flushed per protocol. The incisions were covered with Dermabond then covered with a sterile dressing. COMPLICATIONS: COMPLICATIONS None immediate IMPRESSION: Technically successful right IJ power-injectable port catheter placement. Ready for routine use. Electronically Signed   By: Lucrezia Europe M.D.   On: 05/25/2018 14:51   Ir Thoracentesis Asp Pleural Space W/img Guide  Result Date: 05/12/2018 INDICATION: Patient with history of left-sided breast cancer, COPD, dyspnea, and left pleural effusion. Request is made for diagnostic and therapeutic left  thoracentesis. EXAM: ULTRASOUND GUIDED DIAGNOSTIC AND THERAPEUTIC THORACENTESIS MEDICATIONS: 10 mL 1% lidocaine COMPLICATIONS: None immediate. PROCEDURE: An ultrasound guided thoracentesis was thoroughly discussed with the patient and questions answered. The benefits, risks, alternatives and complications were also discussed. The patient understands and wishes to proceed with the procedure. Written consent was obtained. Ultrasound was performed to localize and mark an adequate pocket of fluid in the left chest. The area was then prepped and draped in the normal sterile fashion. 1% Lidocaine was used for local anesthesia. Under ultrasound guidance a 6 Fr Safe-T-Centesis catheter was introduced. Thoracentesis was performed. The catheter was removed and a dressing applied. FINDINGS: A total of approximately 1.55 L of hazy gold fluid was removed. Procedure was stopped after 1.55 L due to patient coughing/discomfort. Samples were sent to the laboratory as requested by the clinical team. IMPRESSION: Successful ultrasound guided left thoracentesis yielding 1.55 L of pleural fluid. Read by: Earley Abide, PA-C Electronically Signed   By: Jerilynn Mages.  Shick M.D.   On: 05/12/2018 13:35   US Thoracentesis Asp Pleural Space W/img Guide  Result Date: 05/29/2018 INDICATION: 63 year old female with recurrent left pleural effusion, malignant EXAM: ULTRASOUND GUIDED LEFT THORACENTESIS MEDICATIONS: None COMPLICATIONS: None PROCEDURE: An ultrasound guided thoracentesis was thoroughly discussed with the patient and questions answered. The benefits, risks, alternatives and complications were also discussed. The patient understands and wishes to proceed with the procedure. Written consent was obtained. Ultrasound was performed to localize and mark an adequate pocket of fluid in the left chest. The area was then prepped and draped in the normal sterile fashion. 1% Lidocaine was used for local anesthesia. Under ultrasound guidance a 8 Fr  Safe-T-Centesis catheter was introduced. Thoracentesis was performed. The patient did not want to proceed once she started coughing, at a volume of 1.6 L. The catheter was removed and a dressing applied. FINDINGS: A total of approximately 1.6 L of amber fluid was removed. IMPRESSION: Status post ultrasound-guided left thoracentesis with 1.6 L fluid removed. The patient does not wish to proceed to full drainage given her symptoms of coughing and pain. Residual fluid at completion. Signed, Dulcy Fanny. Dellia Nims, RPVI [facility] Radiology Electronically Signed   By: Corrie Mckusick D.O.   On: 05/29/2018 12:21   US Thoracentesis Asp Pleural Space W/img Guide  Result Date: 05/28/2018 INDICATION: Patient with history of left-sided breast cancer, COPD, dyspnea, and recurrent left pleural effusion. Request is made for therapeutic left thoracentesis. EXAM: ULTRASOUND GUIDED THERAPEUTIC LEFT THORACENTESIS MEDICATIONS: 10 mL 1% lidocaine COMPLICATIONS: None immediate. PROCEDURE: An ultrasound guided thoracentesis was thoroughly discussed with the patient and questions answered. The benefits, risks, alternatives and complications were also discussed. The patient understands and wishes to proceed with the procedure. Written consent was obtained. Ultrasound was performed to localize and mark an adequate pocket of fluid in the left chest. The area  was then prepped and draped in the normal sterile fashion. 1% Lidocaine was used for local anesthesia. Under ultrasound guidance a 6 Fr Safe-T-Centesis catheter was introduced. Thoracentesis was performed. The catheter was removed and a dressing applied. FINDINGS: A total of approximately 1.1 L of hazy gold fluid was removed. Procedure was stopped after 1.1 L per patient request due to patient coughing. IMPRESSION: Successful ultrasound guided left thoracentesis yielding 1.1 L of pleural fluid. Read by: Earley Abide, PA-C No  pneumothorax on follow-up radiograph Electronically Signed   By: Lucrezia Europe M.D.   On: 05/28/2018 13:15      Subjective: Patient seen and examined the bedside this morning.  She had thoracentesis today.  1.6 L of fluid was removed.  She remained hemodynamically stable after thoracentesis.  She was complaining of pain.  She has oxycodone at home.  Will supply lidocaine patch.  Discharge Exam: Vitals:   05/29/18 1120 05/29/18 1324  BP: 124/74 104/67  Pulse:  73  Resp:  20  Temp:  98.1 F (36.7 C)  SpO2:  95%   Vitals:   05/29/18 1105 05/29/18 1115 05/29/18 1120 05/29/18 1324  BP: 108/66 101/68 124/74 104/67  Pulse:    73  Resp:    20  Temp:    98.1 F (36.7 C)  TempSrc:    Oral  SpO2:    95%  Weight:      Height:        General: Pt is alert, awake, not in acute distress Cardiovascular: RRR, S1/S2 +, no rubs, no gallops Respiratory: Decreased air entry on the left side Abdominal: Soft, NT, ND, bowel sounds + Extremities: no edema, no cyanosis    The results of significant diagnostics from this hospitalization (including imaging, microbiology, ancillary and laboratory) are listed below for reference.     Microbiology: Recent Results (from the past 240 hour(s))  SARS Coronavirus 2 (CEPHEID - Performed in [place] hospital lab), Hosp Order     Status: None   Collection Time: 05/27/18  4:53 PM  Result Value Ref Range Status   SARS Coronavirus 2 NEGATIVE NEGATIVE Final    Comment: (NOTE) If result is NEGATIVE SARS-CoV-2 target nucleic acids are NOT DETECTED. The SARS-CoV-2 RNA is generally detectable in upper and lower  respiratory specimens during the acute phase of infection. The lowest  concentration of SARS-CoV-2 viral copies this assay can detect is 250  copies / mL. A negative result does not preclude SARS-CoV-2 infection  and should not be used as the sole basis for treatment or other  patient management decisions.  A negative result may occur with  improper  specimen collection / handling, submission of specimen other  than nasopharyngeal swab, presence of viral mutation(s) within the  areas targeted by this assay, and inadequate number of viral copies  (<250 copies / mL). A negative result must be combined with clinical  observations, patient history, and epidemiological information. If result is POSITIVE SARS-CoV-2 target nucleic acids are DETECTED. The SARS-CoV-2 RNA is generally detectable in upper and lower  respiratory specimens dur ing the acute phase of infection.  Positive  results are indicative of active infection with SARS-CoV-2.  Clinical  correlation with patient history and other diagnostic information is  necessary to determine patient infection status.  Positive results do  not rule out bacterial infection or co-infection with other viruses. If result is PRESUMPTIVE POSTIVE SARS-CoV-2 nucleic acids MAY BE PRESENT.   A presumptive positive result was obtained on the submitted  specimen  and confirmed on repeat testing.  While 2019 novel coronavirus  (SARS-CoV-2) nucleic acids may be present in the submitted sample  additional confirmatory testing may be necessary for epidemiological  and / or clinical management purposes  to differentiate between  SARS-CoV-2 and other Sarbecovirus currently known to infect humans.  If clinically indicated additional testing with an alternate test  methodology [phone]) is advised. The SARS-CoV-2 RNA is generally  detectable in upper and lower respiratory sp ecimens during the acute  phase of infection. The expected result is Negative. Fact Sheet for Patients:  StrictlyIdeas.no Fact Sheet for Healthcare Providers: BankingDealers.co.za This test is not yet approved or cleared by the [place] FDA and has been authorized for detection and/or diagnosis of SARS-CoV-2 by FDA under an Emergency Use Authorization (EUA).  This EUA will remain in  effect (meaning this test can be used) for the duration of the COVID-19 declaration under Section 564(b)(1) of the Act, 21 U.S.C. section 360bbb-3(b)(1), unless the authorization is terminated or revoked sooner. Performed at [facility], [place] [address] [place], [place] [address]      Labs: BNP (last 3 results) No results for input(s): BNP in the last 8760 hours. Basic Metabolic Panel: Recent Labs  Lab 05/27/18 1653 05/28/18 0601  NA 142 134*  K 3.5 3.8  CL 102 98  CO2 29 28  GLUCOSE 105* 487*  BUN 10 12  CREATININE 0.59 0.69  CALCIUM 9.2 8.1*   Liver Function Tests: Recent Labs  Lab 05/27/18 1653  AST 10*  ALT 9  ALKPHOS 58  BILITOT 0.2*  PROT 7.1  ALBUMIN 3.5   No results for input(s): LIPASE, AMYLASE in the last 168 hours. No results for input(s): AMMONIA in the last 168 hours. CBC: Recent Labs  Lab 05/25/18 1023 05/27/18 1653 05/28/18 0601  WBC 6.6 8.6 5.2  NEUTROABS 5.2 7.8*  --   HGB 12.8 13.3 11.4*  HCT 41.6 42.3 37.2  MCV 98.6 98.4 100.5*  PLT 298 280 290   Cardiac Enzymes: No results for input(s): CKTOTAL, CKMB, CKMBINDEX, TROPONINI in the last 168 hours. BNP: Invalid input(s): POCBNP CBG: No results for input(s): GLUCAP in the last 168 hours. D-Dimer No results for input(s): DDIMER in the last 72 hours. Hgb A1c No results for input(s): HGBA1C in the last 72 hours. Lipid Profile No results for input(s): CHOL, HDL, LDLCALC, TRIG, CHOLHDL, LDLDIRECT in the last 72 hours. Thyroid function studies No results for input(s): TSH, T4TOTAL, T3FREE, THYROIDAB in the last 72 hours.  Invalid input(s): FREET3 Anemia work up No results for input(s): VITAMINB12, FOLATE, FERRITIN, TIBC, IRON, RETICCTPCT in the last 72 hours. Urinalysis    Component Value Date/Time   COLORURINE YELLOW 12/06/2014 2207   APPEARANCEUR CLOUDY (A) 12/06/2014 2207   LABSPEC 1.025 12/06/2014 2207   PHURINE 5.5 12/06/2014 2207   GLUCOSEU NEGATIVE  12/06/2014 2207   HGBUR MODERATE (A) 12/06/2014 2207   BILIRUBINUR NEGATIVE 12/06/2014 2207   KETONESUR NEGATIVE 12/06/2014 2207   PROTEINUR NEGATIVE 12/06/2014 2207   UROBILINOGEN 1.0 07/30/2013 1527   NITRITE POSITIVE (A) 12/06/2014 2207   LEUKOCYTESUR SMALL (A) 12/06/2014 2207   Sepsis Labs Invalid input(s): PROCALCITONIN,  WBC,  LACTICIDVEN Microbiology Recent Results (from the past 240 hour(s))  SARS Coronavirus 2 (CEPHEID - Performed in [place] hospital lab), Hosp Order     Status: None   Collection Time: 05/27/18  4:53 PM  Result Value Ref Range Status   SARS Coronavirus 2 NEGATIVE  NEGATIVE Final    Comment: (NOTE) If result is NEGATIVE SARS-CoV-2 target nucleic acids are NOT DETECTED. The SARS-CoV-2 RNA is generally detectable in upper and lower  respiratory specimens during the acute phase of infection. The lowest  concentration of SARS-CoV-2 viral copies this assay can detect is 250  copies / mL. A negative result does not preclude SARS-CoV-2 infection  and should not be used as the sole basis for treatment or other  patient management decisions.  A negative result may occur with  improper specimen collection / handling, submission of specimen other  than nasopharyngeal swab, presence of viral mutation(s) within the  areas targeted by this assay, and inadequate number of viral copies  (<250 copies / mL). A negative result must be combined with clinical  observations, patient history, and epidemiological information. If result is POSITIVE SARS-CoV-2 target nucleic acids are DETECTED. The SARS-CoV-2 RNA is generally detectable in upper and lower  respiratory specimens dur ing the acute phase of infection.  Positive  results are indicative of active infection with SARS-CoV-2.  Clinical  correlation with patient history and other diagnostic information is  necessary to determine patient infection status.  Positive results do  not rule out bacterial infection or  co-infection with other viruses. If result is PRESUMPTIVE POSTIVE SARS-CoV-2 nucleic acids MAY BE PRESENT.   A presumptive positive result was obtained on the submitted specimen  and confirmed on repeat testing.  While 2019 novel coronavirus  (SARS-CoV-2) nucleic acids may be present in the submitted sample  additional confirmatory testing may be necessary for epidemiological  and / or clinical management purposes  to differentiate between  SARS-CoV-2 and other Sarbecovirus currently known to infect humans.  If clinically indicated additional testing with an alternate test  methodology [phone]) is advised. The SARS-CoV-2 RNA is generally  detectable in upper and lower respiratory sp ecimens during the acute  phase of infection. The expected result is Negative. Fact Sheet for Patients:  StrictlyIdeas.no Fact Sheet for Healthcare Providers: BankingDealers.co.za This test is not yet approved or cleared by the [place] FDA and has been authorized for detection and/or diagnosis of SARS-CoV-2 by FDA under an Emergency Use Authorization (EUA).  This EUA will remain in effect (meaning this test can be used) for the duration of the COVID-19 declaration under Section 564(b)(1) of the Act, 21 U.S.C. section 360bbb-3(b)(1), unless the authorization is terminated or revoked sooner. Performed at [facility], [place] [address] [place], [place] [address]     Please note: You were cared for by a hospitalist during your hospital stay. Once you are discharged, your primary care physician will handle any further medical issues. Please note that NO REFILLS for any discharge medications will be authorized once you are discharged, as it is imperative that you return to your primary care physician (or establish a relationship with a primary care physician if you do not have one) for your post hospital discharge needs so that they can  reassess your need for medications and monitor your lab values.    Time coordinating discharge: 40 minutes  SIGNED:   Shelly Coss, MD  [facility] 05/29/2018, 2:35 PM Pager [phone]  If 7PM-7AM, please contact night-coverage [email or web address] Password [email or web address]

## 2018-05-31 ENCOUNTER — Encounter: Payer: Self-pay | Admitting: *Deleted

## 2018-05-31 ENCOUNTER — Other Ambulatory Visit: Payer: Self-pay | Admitting: *Deleted

## 2018-05-31 DIAGNOSIS — Z171 Estrogen receptor negative status [ER-]: Secondary | ICD-10-CM

## 2018-05-31 DIAGNOSIS — C50412 Malignant neoplasm of upper-outer quadrant of left female breast: Secondary | ICD-10-CM

## 2018-05-31 NOTE — Progress Notes (Signed)
Pt will have PleurX catheter placed Friday 06/03/2018 [facility] IR.  [facility]. Notified [place] with [facility].

## 2018-05-31 NOTE — Progress Notes (Signed)
64 written order for pleural drainage supplies filled out and faxed to [facility].

## 2018-06-01 ENCOUNTER — Other Ambulatory Visit: Payer: Self-pay

## 2018-06-01 ENCOUNTER — Emergency Department (HOSPITAL_COMMUNITY): Payer: Medicare Other

## 2018-06-01 ENCOUNTER — Inpatient Hospital Stay (HOSPITAL_COMMUNITY)
Admission: EM | Admit: 2018-06-01 | Discharge: 2018-06-03 | DRG: 598 | Disposition: A | Discharge status: Home Health | Payer: Medicare Other | Attending: Internal Medicine | Admitting: Internal Medicine

## 2018-06-01 ENCOUNTER — Encounter (HOSPITAL_COMMUNITY): Payer: Self-pay

## 2018-06-01 ENCOUNTER — Encounter: Payer: Self-pay | Admitting: General Practice

## 2018-06-01 DIAGNOSIS — R7989 Other specified abnormal findings of blood chemistry: Secondary | ICD-10-CM

## 2018-06-01 DIAGNOSIS — Z938 Other artificial opening status: Secondary | ICD-10-CM

## 2018-06-01 DIAGNOSIS — Z7982 Long term (current) use of aspirin: Secondary | ICD-10-CM

## 2018-06-01 DIAGNOSIS — I10 Essential (primary) hypertension: Secondary | ICD-10-CM | POA: Diagnosis present

## 2018-06-01 DIAGNOSIS — R9431 Abnormal electrocardiogram [ECG] [EKG]: Secondary | ICD-10-CM | POA: Diagnosis present

## 2018-06-01 DIAGNOSIS — J910 Malignant pleural effusion: Secondary | ICD-10-CM | POA: Diagnosis present

## 2018-06-01 DIAGNOSIS — R778 Other specified abnormalities of plasma proteins: Secondary | ICD-10-CM | POA: Diagnosis present

## 2018-06-01 DIAGNOSIS — F419 Anxiety disorder, unspecified: Secondary | ICD-10-CM | POA: Diagnosis not present

## 2018-06-01 DIAGNOSIS — M1990 Unspecified osteoarthritis, unspecified site: Secondary | ICD-10-CM | POA: Diagnosis present

## 2018-06-01 DIAGNOSIS — Z9981 Dependence on supplemental oxygen: Secondary | ICD-10-CM

## 2018-06-01 DIAGNOSIS — Z9012 Acquired absence of left breast and nipple: Secondary | ICD-10-CM

## 2018-06-01 DIAGNOSIS — Z720 Tobacco use: Secondary | ICD-10-CM | POA: Diagnosis present

## 2018-06-01 DIAGNOSIS — Z803 Family history of malignant neoplasm of breast: Secondary | ICD-10-CM

## 2018-06-01 DIAGNOSIS — R079 Chest pain, unspecified: Secondary | ICD-10-CM | POA: Diagnosis not present

## 2018-06-01 DIAGNOSIS — F17210 Nicotine dependence, cigarettes, uncomplicated: Secondary | ICD-10-CM | POA: Diagnosis present

## 2018-06-01 DIAGNOSIS — C50412 Malignant neoplasm of upper-outer quadrant of left female breast: Principal | ICD-10-CM

## 2018-06-01 DIAGNOSIS — J449 Chronic obstructive pulmonary disease, unspecified: Secondary | ICD-10-CM | POA: Diagnosis not present

## 2018-06-01 DIAGNOSIS — Z20828 Contact with and (suspected) exposure to other viral communicable diseases: Secondary | ICD-10-CM | POA: Diagnosis present

## 2018-06-01 DIAGNOSIS — F329 Major depressive disorder, single episode, unspecified: Secondary | ICD-10-CM | POA: Diagnosis present

## 2018-06-01 LAB — CBC WITH DIFFERENTIAL/PLATELET
Abs Immature Granulocytes: 0.01 10*3/uL (ref 0.00–0.07)
Basophils Absolute: 0 10*3/uL (ref 0.0–0.1)
Basophils Relative: 1 %
Eosinophils Absolute: 0.2 10*3/uL (ref 0.0–0.5)
Eosinophils Relative: 4 %
HCT: 40.5 % (ref 36.0–46.0)
Hemoglobin: 12.9 g/dL (ref 12.0–15.0)
Immature Granulocytes: 0 %
Lymphocytes Relative: 16 %
Lymphs Abs: 0.9 10*3/uL (ref 0.7–4.0)
MCH: 30.8 pg (ref 26.0–34.0)
MCHC: 31.9 g/dL (ref 30.0–36.0)
MCV: 96.7 fL (ref 80.0–100.0)
Monocytes Absolute: 0.6 10*3/uL (ref 0.1–1.0)
Monocytes Relative: 11 %
Neutro Abs: 3.6 10*3/uL (ref 1.7–7.7)
Neutrophils Relative %: 68 %
Platelets: 275 10*3/uL (ref 150–400)
RBC: 4.19 MIL/uL (ref 3.87–5.11)
RDW: 12.7 % (ref 11.5–15.5)
WBC: 5.3 10*3/uL (ref 4.0–10.5)
nRBC: 0 % (ref 0.0–0.2)

## 2018-06-01 LAB — PROTIME-INR
INR: 1 (ref 0.8–1.2)
Prothrombin Time: 13.4 seconds (ref 11.4–15.2)

## 2018-06-01 LAB — COMPREHENSIVE METABOLIC PANEL
ALT: 10 U/L (ref 0–44)
AST: 10 U/L — ABNORMAL LOW (ref 15–41)
Albumin: 3 g/dL — ABNORMAL LOW (ref 3.5–5.0)
Alkaline Phosphatase: 49 U/L (ref 38–126)
Anion gap: 8 (ref 5–15)
BUN: 7 mg/dL — ABNORMAL LOW (ref 8–23)
CO2: 32 mmol/L (ref 22–32)
Calcium: 8.8 mg/dL — ABNORMAL LOW (ref 8.9–10.3)
Chloride: 100 mmol/L (ref 98–111)
Creatinine, Ser: 0.63 mg/dL (ref 0.44–1.00)
GFR calc Af Amer: >60 mL/min (ref >60)
GFR calc non Af Amer: >60 mL/min (ref >60)
Glucose, Bld: 88 mg/dL (ref 70–99)
Potassium: 3.6 mmol/L (ref 3.5–5.1)
Sodium: 140 mmol/L (ref 135–145)
Total Bilirubin: 0.3 mg/dL (ref 0.3–1.2)
Total Protein: 6.1 g/dL — ABNORMAL LOW (ref 6.5–8.1)

## 2018-06-01 LAB — TROPONIN I: Troponin I: 0.03 ng/mL (ref <0.03)

## 2018-06-01 LAB — SARS CORONAVIRUS 2 BY RT PCR (HOSPITAL ORDER, PERFORMED IN ~~LOC~~ HOSPITAL LAB): SARS Coronavirus 2: NEGATIVE

## 2018-06-01 MED ORDER — DOCUSATE SODIUM 100 MG PO CAPS
100.0000 mg | ORAL_CAPSULE | Freq: Two times a day (BID) | ORAL | Status: DC
  Administered 2018-06-01 – 2018-06-03 (×4): 100 mg via ORAL
  Filled 2018-06-01 (×4): qty 1

## 2018-06-01 MED ORDER — IOHEXOL 350 MG/ML SOLN
100.0000 mL | Freq: Once | INTRAVENOUS | Status: AC | PRN
  Administered 2018-06-01: 100 mL via INTRAVENOUS

## 2018-06-01 MED ORDER — FENTANYL CITRATE (PF) 100 MCG/2ML IJ SOLN
50.0000 ug | INTRAMUSCULAR | Status: DC | PRN
  Administered 2018-06-01: 50 ug via INTRAVENOUS
  Filled 2018-06-01: qty 2

## 2018-06-01 MED ORDER — SODIUM CHLORIDE 0.9 % IV BOLUS
500.0000 mL | Freq: Once | INTRAVENOUS | Status: AC
  Administered 2018-06-01: 500 mL via INTRAVENOUS

## 2018-06-01 MED ORDER — ONDANSETRON HCL 4 MG/2ML IJ SOLN
4.0000 mg | Freq: Three times a day (TID) | INTRAMUSCULAR | Status: DC | PRN
  Administered 2018-06-01 – 2018-06-03 (×3): 4 mg via INTRAVENOUS
  Filled 2018-06-01 (×3): qty 2

## 2018-06-01 MED ORDER — OXYCODONE-ACETAMINOPHEN 5-325 MG PO TABS
1.0000 | ORAL_TABLET | Freq: Four times a day (QID) | ORAL | Status: DC | PRN
  Administered 2018-06-01 – 2018-06-03 (×5): 1 via ORAL
  Filled 2018-06-01 (×6): qty 1

## 2018-06-01 MED ORDER — ALBUTEROL SULFATE (2.5 MG/3ML) 0.083% IN NEBU: 2.5000 mg | INHALATION_SOLUTION | Freq: Four times a day (QID) | RESPIRATORY_TRACT | Status: DC | PRN

## 2018-06-01 MED ORDER — POLYETHYLENE GLYCOL 3350 17 G PO PACK: 17.0000 g | PACK | Freq: Every day | ORAL | Status: DC | PRN

## 2018-06-01 MED ORDER — METOPROLOL TARTRATE 25 MG PO TABS
25.0000 mg | ORAL_TABLET | Freq: Two times a day (BID) | ORAL | Status: DC
  Administered 2018-06-01 – 2018-06-03 (×4): 25 mg via ORAL
  Filled 2018-06-01 (×4): qty 1

## 2018-06-01 MED ORDER — SENNA 8.6 MG PO TABS
1.0000 | ORAL_TABLET | Freq: Two times a day (BID) | ORAL | Status: DC
  Administered 2018-06-02 – 2018-06-03 (×3): 8.6 mg via ORAL
  Filled 2018-06-01 (×3): qty 1

## 2018-06-01 MED ORDER — SODIUM CHLORIDE 0.9% FLUSH: 3.0000 mL | INTRAVENOUS | Status: DC | PRN

## 2018-06-01 MED ORDER — ACETAMINOPHEN 325 MG PO TABS: 650.0000 mg | ORAL_TABLET | Freq: Four times a day (QID) | ORAL | Status: DC | PRN

## 2018-06-01 MED ORDER — SODIUM CHLORIDE (PF) 0.9 % IJ SOLN
INTRAMUSCULAR | Status: AC
  Filled 2018-06-01: qty 100

## 2018-06-01 MED ORDER — BISACODYL 10 MG RE SUPP: 10.0000 mg | Freq: Every day | RECTAL | Status: DC | PRN

## 2018-06-01 MED ORDER — PAROXETINE HCL 20 MG PO TABS
20.0000 mg | ORAL_TABLET | Freq: Two times a day (BID) | ORAL | Status: DC
  Administered 2018-06-01 – 2018-06-03 (×4): 20 mg via ORAL
  Filled 2018-06-01 (×4): qty 1

## 2018-06-01 MED ORDER — MORPHINE SULFATE (PF) 4 MG/ML IV SOLN
4.0000 mg | Freq: Once | INTRAVENOUS | Status: AC
  Administered 2018-06-01: 4 mg via INTRAVENOUS
  Filled 2018-06-01: qty 1

## 2018-06-01 MED ORDER — ALPRAZOLAM 0.5 MG PO TABS
0.5000 mg | ORAL_TABLET | Freq: Three times a day (TID) | ORAL | Status: DC | PRN
  Administered 2018-06-02: 0.5 mg via ORAL
  Filled 2018-06-01 (×2): qty 1

## 2018-06-01 MED ORDER — AMLODIPINE BESYLATE 5 MG PO TABS
5.0000 mg | ORAL_TABLET | Freq: Every day | ORAL | Status: DC
  Administered 2018-06-02 – 2018-06-03 (×2): 5 mg via ORAL
  Filled 2018-06-01 (×2): qty 1

## 2018-06-01 MED ORDER — GABAPENTIN 300 MG PO CAPS
300.0000 mg | ORAL_CAPSULE | Freq: Three times a day (TID) | ORAL | Status: DC
  Administered 2018-06-01 – 2018-06-03 (×5): 300 mg via ORAL
  Filled 2018-06-01 (×5): qty 1

## 2018-06-01 MED ORDER — FENTANYL CITRATE (PF) 100 MCG/2ML IJ SOLN
50.0000 ug | Freq: Once | INTRAMUSCULAR | Status: AC
  Administered 2018-06-01: 50 ug via INTRAVENOUS
  Filled 2018-06-01: qty 2

## 2018-06-01 MED ORDER — ACETAMINOPHEN 650 MG RE SUPP: 650.0000 mg | Freq: Four times a day (QID) | RECTAL | Status: DC | PRN

## 2018-06-01 MED ORDER — SODIUM CHLORIDE 0.9 % IV SOLN: 250.0000 mL | INTRAVENOUS | Status: DC | PRN

## 2018-06-01 MED ORDER — SODIUM CHLORIDE 0.9% FLUSH
3.0000 mL | Freq: Two times a day (BID) | INTRAVENOUS | Status: DC
  Administered 2018-06-03: 3 mL via INTRAVENOUS

## 2018-06-01 NOTE — ED Notes (Signed)
Date and time results received: 06/01/18 1813   Test: Troponin  Critical Value: 0.03  Name of Provider Notified: Dr. Ascencion Dike   Orders Received? Or Actions Taken?: Will continue to monitor and await new orders

## 2018-06-01 NOTE — Progress Notes (Signed)
Deborah Mcintyre CSW Progress Notes  Call from son, Trace Cederberg, concerned about patient's living situation.  States there are significant environmental concerns w current housing, patient wants to move out ASAP.  Also reports patient is struggling w mobility at home, will "say she is fine, but she is really not doing well at all."  Reports multiple admissions, "she is getting ready to come back in soon."  Wanted help getting patient into more stable and healthy housing.  Can refer to [facility] if patient consents - call made to patient, left VM.  Will also email list of senior/disabled housing options to son so family can explore possible resources.  MD can discuss option of referral for Medicaid Personal Care Services if patient's condition meets criteria for these services. However, per son, patient has been reluctant to have outsiders come into the home, which is an understandable concern in current circumstances.  MD advised of need to discuss need for additional help at home and possible PCS referral at next appointment.  Edwyna Shell, LCSW Clinical Social Worker Phone:  [phone]

## 2018-06-01 NOTE — ED Notes (Signed)
Bed: FW26 Expected date:  Expected time:  Means of arrival:  Comments: EMS 53 F Chest Pain /CA

## 2018-06-01 NOTE — ED Provider Notes (Signed)
[place] DEPT Provider Note   CSN: [id number] Arrival date & time: 06/01/18  1608    History   Chief Complaint Chief Complaint  Patient presents with  . Chest Pain    HPI Deborah Mcintyre is a 63 y.o. female.     HPI Patient has left-sided chest pain and shortness of breath which has been gradually worsening since her last thoracentesis.  States this is the same pain she has presented with in the past requiring thoracentesis.  She denies any fever or chills.  States the pain and shortness of breath is worse with lying flat.  She denies any new lower extremity swelling or pain.  Patient has had nonproductive cough. Past Medical History:  Diagnosis Date  . Anxiety   . Arthritis   . Bronchitis   . COPD (chronic obstructive pulmonary disease) ([place])   . Depression   . Hypertension   . Malignant neoplasm of upper-outer quadrant of left female breast ([place]) 01/04/2015  . Nocturia   . Numbness and tingling    toes - bilateral  . Pneumonia   . PONV (postoperative nausea and vomiting)    Nausea    Patient Active Problem List   Diagnosis Date Noted  . Tobacco abuse 06/01/2018  . Prolonged QT interval 06/01/2018  . Chest pain 06/01/2018  . Elevated troponin 06/01/2018  . Cigarette smoker 05/27/2018  . Goals of care, counseling/discussion 05/19/2018  . 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  . Hypertension 05/12/2018  . COPD (chronic obstructive pulmonary disease) ([place]) 05/12/2018  . Anxiety and depression 05/12/2018  . Community acquired pneumonia of left lung 02/04/2017  . Antineoplastic chemotherapy induced pancytopenia ([employer]) 04/26/2015  . Antineoplastic chemotherapy induced anemia 04/05/2015  . Breast cancer of upper-outer quadrant of left female breast ([place]) 01/01/2015  . Acute exacerbation of chronic bronchitis ([place]) 08/03/2013    Past Surgical History:  Procedure Laterality Date  . BREAST LUMPECTOMY Bilateral    x3  . BREAST  LUMPECTOMY WITH RADIOACTIVE SEED LOCALIZATION Right 08/23/2015   Procedure: RIGHT BREAST LUMPECTOMY WITH RADIOACTIVE SEED LOCALIZATION;  Surgeon: Alphonsa Overall, MD;  Location: [facility];  Service: General;  Laterality: Right;  . CESAREAN SECTION     x4  . IR IMAGING GUIDED PORT INSERTION  05/25/2018  . IR REMOVAL TUN ACCESS W/ PORT W/O FL MOD SED  10/20/2016  . IR THORACENTESIS ASP PLEURAL SPACE W/IMG GUIDE  05/12/2018  . MASTECTOMY Left 08/23/2015    RIGHT BREAST LUMPECTOMY WITH RADIOACTIVE SEED LOCALIZATION,  LEFT MASTECTOMY WITH AXILLARY LYMPH NODE DISSECTION  . MASTECTOMY WITH AXILLARY LYMPH NODE DISSECTION Left 08/23/2015   Procedure: LEFT MASTECTOMY WITH AXILLARY LYMPH NODE DISSECTION;  Surgeon: Alphonsa Overall, MD;  Location: [facility];  Service: General;  Laterality: Left;  [name] MULTIPLE TOOTH EXTRACTIONS       OB History   No obstetric history on file.      Home Medications    Prior to Admission medications   Medication Sig Start Date End Date Taking? Authorizing Provider  albuterol (PROVENTIL HFA;VENTOLIN HFA) 108 (90 Base) MCG/ACT inhaler Inhale 1-2 puffs into the lungs every 6 (six) hours as needed for wheezing or shortness of breath.   Yes [provider]  albuterol (PROVENTIL) (2.5 MG/3ML) 0.083% nebulizer solution Take 3 mLs (2.5 mg total) by nebulization every 6 (six) hours as needed for wheezing or shortness of breath. 05/15/18  Yes Regalado, Belkys A, MD  ALPRAZolam (XANAX) 0.5 MG tablet Take 1 tablet (0.5 mg total) by  mouth 3 (three) times daily as needed for anxiety or sleep. 05/15/18  Yes Regalado, Belkys A, MD  amLODipine (NORVASC) 5 MG tablet Take 1 tablet (5 mg total) by mouth daily. 05/16/18  Yes Regalado, Belkys A, MD  aspirin EC 81 MG tablet Take 81 mg by mouth daily.   Yes [provider]  gabapentin (NEURONTIN) 300 MG capsule Take 1 capsule (300 mg total) by mouth 3 (three) times daily. 09/23/17  Yes Nicholas Lose, MD  ibuprofen (ADVIL) 200 MG tablet Take 800 mg  by mouth every 6 (six) hours as needed for moderate pain.   Yes [provider]  lidocaine-prilocaine (EMLA) cream Apply to affected area once Patient taking differently: Apply 1 application topically daily as needed. Port 05/19/18  Yes Nicholas Lose, MD  metoprolol (LOPRESSOR) 50 MG tablet Take 0.5 tablets (25 mg total) by mouth 2 (two) times daily. 08/07/13  Yes [name], MD  ondansetron (ZOFRAN) 8 MG tablet Take 1 tablet (8 mg total) by mouth every 8 (eight) hours as needed for nausea. 09/23/17  Yes Nicholas Lose, MD  oxyCODONE-acetaminophen (PERCOCET/ROXICET) 5-325 MG tablet Take 1 tablet by mouth every 8 (eight) hours as needed for moderate pain or severe pain (1 tab every 4-6 hour prn pain). Patient taking differently: Take 1 tablet by mouth every 6 (six) hours as needed for moderate pain or severe pain.  05/19/18  Yes Nicholas Lose, MD  PARoxetine (PAXIL) 20 MG tablet Take 1 tablet (20 mg total) by mouth 2 (two) times daily. 05/15/18  Yes Regalado, Belkys A, MD  PREMARIN vaginal cream INSERT 1 APPLICATORFUL VAGINALLY DAILY Patient taking differently: Place 1 Applicatorful vaginally daily.  04/19/17  Yes Nicholas Lose, MD  prochlorperazine (COMPAZINE) 10 MG tablet Take 1 tablet (10 mg total) by mouth every 6 (six) hours as needed (Nausea or vomiting). 05/19/18  Yes Nicholas Lose, MD  ferrous sulfate 325 (65 FE) MG tablet Take 1 tablet (325 mg total) by mouth daily with breakfast. Patient not taking: Reported on 06/01/2018 02/10/17   [name], MD  lidocaine (LIDODERM) 5 % Place 1 patch onto the skin daily. Remove & Discard patch within 12 hours or as directed by MD Patient not taking: Reported on 06/01/2018 05/29/18   Shelly Coss, MD    Family History Family History  Problem Relation Age of Onset  . Breast cancer Maternal Aunt     Social History Social History   Tobacco Use  . Smoking status: Current Every Day Smoker    Packs/day: 0.75    Years: 45.00    Pack years: 33.75     Types: Cigarettes  . Smokeless tobacco: Never Used  Substance Use Topics  . Alcohol use: Yes    Comment: ocassionally  . Drug use: No     Allergies   Patient has no known allergies.   Review of Systems Review of Systems  Constitutional: Negative for chills and fever.  HENT: Negative for sore throat and trouble swallowing.   Eyes: Negative for visual disturbance.  Respiratory: Positive for cough and shortness of breath.   Cardiovascular: Positive for chest pain. Negative for palpitations and leg swelling.  Gastrointestinal: Negative for abdominal pain, constipation, diarrhea, nausea and vomiting.  Genitourinary: Negative for flank pain and frequency.  Musculoskeletal: Negative for back pain, myalgias and neck pain.  Skin: Negative for rash and wound.  Neurological: Negative for dizziness, weakness, light-headedness, numbness and headaches.  All other systems reviewed and are negative.    Physical Exam Updated  Vital Signs BP (!) 139/97 (BP Location: Left Arm)   Pulse (!) 107   Temp 98.3 F (36.8 C) (Oral)   Resp 18   Ht 5' 7.5" (1.715 m)   Wt 82.2 kg   SpO2 95%   BMI 27.96 kg/m   Physical Exam Vitals signs and nursing note reviewed.  Constitutional:      Appearance: Normal appearance. She is well-developed.  HENT:     Head: Normocephalic and atraumatic.     Nose: Nose normal.  Eyes:     Pupils: Pupils are equal, round, and reactive to light.  Neck:     Musculoskeletal: Normal range of motion and neck supple.  Cardiovascular:     Rate and Rhythm: Normal rate and regular rhythm.     Heart sounds: No murmur. No friction rub. No gallop.   Pulmonary:     Effort: Pulmonary effort is normal.     Comments: No respiratory distress.  Diminished breath sounds in the left lung field. Abdominal:     General: Bowel sounds are normal.     Palpations: Abdomen is soft.     Tenderness: There is no abdominal tenderness. There is no guarding or rebound.  Musculoskeletal:  Normal range of motion.        General: No swelling, tenderness, deformity or signs of injury.     Right lower leg: No edema.     Left lower leg: No edema.  Skin:    General: Skin is warm and dry.     Capillary Refill: Capillary refill takes less than 2 seconds.     Findings: No erythema or rash.  Neurological:     General: No focal deficit present.     Mental Status: She is alert and oriented to person, place, and time.     Comments: Moving all extremities without focal deficit.  Sensation intact.  Psychiatric:        Behavior: Behavior normal.      ED Treatments / Results  Labs (all labs ordered are listed, but only abnormal results are displayed) Labs Reviewed  COMPREHENSIVE METABOLIC PANEL - Abnormal; Notable for the following components:      Result Value   BUN 7 (*)    Calcium 8.8 (*)    Total Protein 6.1 (*)    Albumin 3.0 (*)    AST 10 (*)    All other components within normal limits  TROPONIN I - Abnormal; Notable for the following components:   Troponin I 0.03 (*)    All other components within normal limits  SARS CORONAVIRUS 2 (HOSPITAL ORDER, [facility])  CBC WITH DIFFERENTIAL/PLATELET  PROTIME-INR  TROPONIN I  TROPONIN I  TROPONIN I  MAGNESIUM  MAGNESIUM  PHOSPHORUS  TSH  COMPREHENSIVE METABOLIC PANEL  CBC    EKG EKG Interpretation  Date/Time:  Wednesday Jun 01 2018 18:16:23 EDT Ventricular Rate:  102 PR Interval:    QRS Duration: 139 QT Interval:  387 QTC Calculation: 505 R Axis:   64 Text Interpretation:  Sinus tachycardia Right bundle branch block Confirmed by Julianne Rice [phone]) on 06/01/2018 10:42:19 PM   Radiology Ct Angio Chest Pe W And/or Wo Contrast  Result Date: 06/01/2018 CLINICAL DATA:  Dyspnea, multiple recent left thoracentesis procedures, history of left breast cancer. Cytology from 05/12/2018 left thoracentesis demonstrated adenocarcinoma. EXAM: CT ANGIOGRAPHY CHEST WITH CONTRAST TECHNIQUE:  Multidetector CT imaging of the chest was performed using the standard protocol during bolus administration of intravenous contrast. Multiplanar  CT image reconstructions and MIPs were obtained to evaluate the vascular anatomy. CONTRAST:  137mL OMNIPAQUE IOHEXOL 350 MG/ML SOLN COMPARISON:  Chest radiograph from earlier today. 05/12/2018 chest CT. FINDINGS: Cardiovascular: The study is moderate quality for the evaluation of pulmonary embolism, with some motion degradation. There are no filling defects in the central, lobar, segmental or subsegmental pulmonary artery branches to suggest acute pulmonary embolism. Atherosclerotic thoracic aorta with stable ectatic 4.0 cm ascending thoracic aorta. Stable dilated main pulmonary artery (3.9 cm diameter). Normal heart size. No significant pericardial fluid/thickening. Right internal jugular Port-A-Cath terminates at the cavoatrial junction. Mediastinum/Nodes: No discrete thyroid nodules. Unremarkable esophagus. No axillary adenopathy. Left axillary surgical clips again noted. Enlarged 1.4 cm left prevascular node (series 11/image 34), stable. Multiple enlarged right paratracheal nodes up to 2.1 cm (series 11/image 32), mildly increased from 1.9 cm using similar measurement technique. Enlarged 2.1 cm subcarinal node (series 11/image 42), increased from 1.8 cm. Enlarged 1.8 cm left hilar node (series 11/image 36), increased from 1.4 cm. No right hilar adenopathy. Lungs/Pleura: No pneumothorax. Moderate to large left pleural effusion, predominantly basilar. No right pleural effusion. Dense masslike consolidation replacing the left upper lobe measuring up to 12.0 x 5.8 cm (series 11/image 37), not appreciably changed in size, with loss of previously seen apical left upper lobe aeration. Similar moderate to severe compressive atelectasis in the left lower lobe. Basilar right upper lobe 0.8 cm solid pulmonary nodule (series 10/image 69), increased from 0.3 cm. Peripheral right  lower lobe 1.3 cm subsolid nodule (series 10/image 60), increased from 1.1 cm and increased in density. Upper abdomen: No acute abnormality. Musculoskeletal: No aggressive appearing focal osseous lesions. Left mastectomy. Superficial subcutaneous round cystic 1.6 cm lesion in midline back (series 11/image 40), unchanged, presumably a sebaceous cyst. Mild thoracic spondylosis. Review of the MIP images confirms the above findings. IMPRESSION: 1. Motion degraded scan. No evidence of pulmonary embolism. 2. Moderate to large left pleural effusion. Dense masslike consolidation replacing the left upper lobe, not appreciably changed in size, likely representing confluent metastatic disease. Worsening aeration in the left lung. 3. Interval growth of two scattered right pulmonary nodules, suspicious for pulmonary metastases. 4. Mediastinal and left hilar adenopathy is stable to mildly increased, suspicious for metastatic nodal disease. 5. Stable ectatic 4.0 cm ascending thoracic aorta. Recommend annual imaging followup by CTA or MRA. This recommendation follows 2010 ACCF/AHA/AATS/ACR/ASA/SCA/SCAI/SIR/STS/SVM Guidelines for the Diagnosis and Management of Patients with Thoracic Aortic Disease. 2010; 121: R678-L381. 6. Stable dilated main pulmonary artery, suggesting chronic pulmonary arterial hypertension. Aortic Atherosclerosis (ICD10-I70.0). Electronically Signed   By: Ilona Sorrel M.D.   On: 06/01/2018 20:23   Dg Chest Port 1 View  Result Date: 06/01/2018 CLINICAL DATA:  Chest pain EXAM: PORTABLE CHEST 1 VIEW COMPARISON:  05/29/2018 FINDINGS: The right-sided Port-A-Cath well position. There is near complete opacification of the left hemithorax, progressed from prior study. The right lung field is essentially clear. There is no acute osseous abnormality. The cardiac silhouette is poorly evaluated on this examination. IMPRESSION: Near complete opacification of the left hemithorax, worsened since prior study. Findings are  suspicious for a growing left-sided pleural effusion with associated compressive atelectasis. Electronically Signed   By: Constance Holster M.D.   On: 06/01/2018 17:20    Procedures Procedures (including critical care time)  Medications Ordered in ED Medications  sodium chloride (PF) 0.9 % injection (has no administration in time range)  fentaNYL (SUBLIMAZE) injection 50 mcg (50 mcg Intravenous Given 06/01/18 2236)  oxyCODONE-acetaminophen (PERCOCET/ROXICET) 5-325  MG per tablet 1 tablet (has no administration in time range)  amLODipine (NORVASC) tablet 5 mg (has no administration in time range)  metoprolol tartrate (LOPRESSOR) tablet 25 mg (has no administration in time range)  ALPRAZolam (XANAX) tablet 0.5 mg (has no administration in time range)  PARoxetine (PAXIL) tablet 20 mg (has no administration in time range)  gabapentin (NEURONTIN) capsule 300 mg (has no administration in time range)  albuterol (PROVENTIL) (2.5 MG/3ML) 0.083% nebulizer solution 2.5 mg (has no administration in time range)  acetaminophen (TYLENOL) tablet 650 mg (has no administration in time range)    Or  acetaminophen (TYLENOL) suppository 650 mg (has no administration in time range)  sodium chloride flush (NS) 0.9 % injection 3 mL (has no administration in time range)  sodium chloride flush (NS) 0.9 % injection 3 mL (has no administration in time range)  0.9 %  sodium chloride infusion (has no administration in time range)  docusate sodium (COLACE) capsule 100 mg (has no administration in time range)  senna (SENOKOT) tablet 8.6 mg (has no administration in time range)  polyethylene glycol (MIRALAX / GLYCOLAX) packet 17 g (has no administration in time range)  bisacodyl (DULCOLAX) suppository 10 mg (has no administration in time range)  morphine 4 MG/ML injection 4 mg (4 mg Intravenous Given 06/01/18 1742)  iohexol (OMNIPAQUE) 350 MG/ML injection 100 mL (100 mLs Intravenous Contrast Given 06/01/18 1950)  sodium  chloride 0.9 % bolus 500 mL (500 mLs Intravenous New Bag/Given 06/01/18 2007)  fentaNYL (SUBLIMAZE) injection 50 mcg (50 mcg Intravenous Given 06/01/18 2059)     Initial Impression / Assessment and Plan / ED Course  I have reviewed the triage vital signs and the nursing notes.  Pertinent labs & imaging results that were available during my care of the patient were reviewed by me and considered in my medical decision making (see chart for details).       Discussed with hospitalist who will see patient in the emergency department and admit.  Will get CT angio chest to rule out PE.   Final Clinical Impressions(s) / ED Diagnoses   Final diagnoses:  Malignant pleural effusion    ED Discharge Orders    None       Julianne Rice, MD 06/01/18 2243

## 2018-06-01 NOTE — ED Notes (Signed)
ED TO INPATIENT HANDOFF REPORT  Name/Age/Gender Deborah Mcintyre 63 y.o. female  Code Status Code Status History    Date Active Date Inactive Code Status Order ID Comments User Context   05/27/2018 2223 05/29/2018 1957 Full Code [id number]  Gwynne Edinger, MD Inpatient   05/12/2018 0220 05/15/2018 1922 DNR [id number]  Lenore Cordia, MD ED   02/04/2017 2236 02/10/2017 1447 Full Code [id number]  [name], MD ED   08/23/2015 1551 08/24/2015 1244 Full Code [id number]  Alphonsa Overall, MD Inpatient   08/03/2013 2300 08/07/2013 1602 Full Code [id number]  [name], MD ED      Home/SNF/Other Home  Chief Complaint chest wall pain; fever; Ca pt  Level of Care/Admitting Diagnosis ED Disposition    ED Disposition Condition Comment   Admit  Hospital Area: [facility] [[id number]]  Level of Care: Telemetry [5]  Admit to tele based on following criteria: Monitor QTC interval  Covid Evaluation: N/A  Diagnosis: Malignant pleural effusion [511.81.ICD-9-CM]  Admitting Physician: Toy Baker [[id number]]  Attending Physician: Toy Baker [[id number]]  PT Class (Do Not Modify): Observation [104]  PT Acc Code (Do Not Modify): Observation [10022]       Medical History Past Medical History:  Diagnosis Date  . Anxiety   . Arthritis   . Bronchitis   . COPD (chronic obstructive pulmonary disease) ([place])   . Depression   . Hypertension   . Malignant neoplasm of upper-outer quadrant of left female breast ([place]) 01/04/2015  . Nocturia   . Numbness and tingling    toes - bilateral  . Pneumonia   . PONV (postoperative nausea and vomiting)    Nausea    Allergies No Known Allergies  IV Location/Drains/Wounds Patient Lines/Drains/Airways Status   Active Line/Drains/Airways    Name:   Placement date:   Placement time:   Site:   Days:   Implanted Port 05/25/18 Right Chest   05/25/18    1257    Chest   7   Peripheral IV 06/01/18 Left Forearm   06/01/18    1940    Forearm    less than 1   Peripheral IV 06/01/18   06/01/18    2040    -   less than 1   Closed System Drain 1 Left;Ventral;Lateral Chest Bulb (JP) 9 Fr.   08/23/15    1242    Chest   1013   Closed System Drain 2 Left;Ventral;Midline Chest Bulb (JP) 9 Fr.   08/23/15    1243    Chest   1013   Incision (Closed) 08/23/15 Breast Bilateral   08/23/15    1241     1013          Labs/Imaging Results for orders placed or performed during the hospital encounter of 06/01/18 (from the past 48 hour(s))  CBC with Differential/Platelet     Status: None   Collection Time: 06/01/18  5:14 PM  Result Value Ref Range   WBC 5.3 4.0 - 10.5 K/uL   RBC 4.19 3.87 - 5.11 MIL/uL   Hemoglobin 12.9 12.0 - 15.0 g/dL   HCT 40.5 36.0 - 46.0 %   MCV 96.7 80.0 - 100.0 fL   MCH 30.8 26.0 - 34.0 pg   MCHC 31.9 30.0 - 36.0 g/dL   RDW 12.7 11.5 - 15.5 %   Platelets 275 150 - 400 K/uL   nRBC 0.0 0.0 - 0.2 %   Neutrophils Relative % 68 %   Neutro Abs  3.6 1.7 - 7.7 K/uL   Lymphocytes Relative 16 %   Lymphs Abs 0.9 0.7 - 4.0 K/uL   Monocytes Relative 11 %   Monocytes Absolute 0.6 0.1 - 1.0 K/uL   Eosinophils Relative 4 %   Eosinophils Absolute 0.2 0.0 - 0.5 K/uL   Basophils Relative 1 %   Basophils Absolute 0.0 0.0 - 0.1 K/uL   Immature Granulocytes 0 %   Abs Immature Granulocytes 0.01 0.00 - 0.07 K/uL    Comment: Performed at [facility], [place] [address] [place], [place] [address]  Comprehensive metabolic panel     Status: Abnormal   Collection Time: 06/01/18  5:14 PM  Result Value Ref Range   Sodium 140 135 - 145 mmol/L   Potassium 3.6 3.5 - 5.1 mmol/L   Chloride 100 98 - 111 mmol/L   CO2 32 22 - 32 mmol/L   Glucose, Bld 88 70 - 99 mg/dL   BUN 7 (L) 8 - 23 mg/dL   Creatinine, Ser 0.63 0.44 - 1.00 mg/dL   Calcium 8.8 (L) 8.9 - 10.3 mg/dL   Total Protein 6.1 (L) 6.5 - 8.1 g/dL   Albumin 3.0 (L) 3.5 - 5.0 g/dL   AST 10 (L) 15 - 41 U/L   ALT 10 0 - 44 U/L   Alkaline Phosphatase 49 38 - 126 U/L    Total Bilirubin 0.3 0.3 - 1.2 mg/dL   GFR calc non Af Amer >60 >60 mL/min   GFR calc Af Amer >60 >60 mL/min   Anion gap 8 5 - 15    Comment: Performed at [facility], [place] [address] [place], [place] [address]  Protime-INR     Status: None   Collection Time: 06/01/18  5:14 PM  Result Value Ref Range   Prothrombin Time 13.4 11.4 - 15.2 seconds   INR 1.0 0.8 - 1.2    Comment: (NOTE) INR goal varies based on device and disease states. Performed at [facility], [place] [address] [place], [place] [address]   Troponin I - ONCE - STAT     Status: Abnormal   Collection Time: 06/01/18  5:14 PM  Result Value Ref Range   Troponin I 0.03 (HH) <0.03 ng/mL    Comment: CRITICAL RESULT CALLED TO, READ BACK BY AND VERIFIED WITH: Ethelda Chick [id number] @ [id number] BY J SCOTTON Performed at [facility], [place] [address], [place],  [address]   SARS Coronavirus 2 (CEPHEID- Performed in [facility] hospital lab), Hosp Order     Status: None   Collection Time: 06/01/18  5:15 PM  Result Value Ref Range   SARS Coronavirus 2 NEGATIVE NEGATIVE    Comment: (NOTE) If result is NEGATIVE SARS-CoV-2 target nucleic acids are NOT DETECTED. The SARS-CoV-2 RNA is generally detectable in upper and lower  respiratory specimens during the acute phase of infection. The lowest  concentration of SARS-CoV-2 viral copies this assay can detect is 250  copies / mL. A negative result does not preclude SARS-CoV-2 infection  and should not be used as the sole basis for treatment or other  patient management decisions.  A negative result may occur with  improper specimen collection / handling, submission of specimen other  than nasopharyngeal swab, presence of viral mutation(s) within the  areas targeted by this assay, and inadequate number of viral copies  (<250 copies / mL). A negative result must be combined with clinical  observations, patient history, and  epidemiological  information. If result is POSITIVE SARS-CoV-2 target nucleic acids are DETECTED. The SARS-CoV-2 RNA is generally detectable in upper and lower  respiratory specimens dur ing the acute phase of infection.  Positive  results are indicative of active infection with SARS-CoV-2.  Clinical  correlation with patient history and other diagnostic information is  necessary to determine patient infection status.  Positive results do  not rule out bacterial infection or co-infection with other viruses. If result is PRESUMPTIVE POSTIVE SARS-CoV-2 nucleic acids MAY BE PRESENT.   A presumptive positive result was obtained on the submitted specimen  and confirmed on repeat testing.  While 2019 novel coronavirus  (SARS-CoV-2) nucleic acids may be present in the submitted sample  additional confirmatory testing may be necessary for epidemiological  and / or clinical management purposes  to differentiate between  SARS-CoV-2 and other Sarbecovirus currently known to infect humans.  If clinically indicated additional testing with an alternate test  methodology [id number]) is advised. The SARS-CoV-2 RNA is generally  detectable in upper and lower respiratory sp ecimens during the acute  phase of infection. The expected result is Negative. Fact Sheet for Patients:  StrictlyIdeas.no Fact Sheet for Healthcare Providers: BankingDealers.co.za This test is not yet approved or cleared by the [place] FDA and has been authorized for detection and/or diagnosis of SARS-CoV-2 by FDA under an Emergency Use Authorization (EUA).  This EUA will remain in effect (meaning this test can be used) for the duration of the COVID-19 declaration under Section 564(b)(1) of the Act, 21 U.S.C. section 360bbb-3(b)(1), unless the authorization is terminated or revoked sooner. Performed at [facility], [place] [address] [place], [place] [address]     Ct Angio Chest Pe W And/or Wo Contrast  Result Date: 06/01/2018 CLINICAL DATA:  Dyspnea, multiple recent left thoracentesis procedures, history of left breast cancer. Cytology from 05/12/2018 left thoracentesis demonstrated adenocarcinoma. EXAM: CT ANGIOGRAPHY CHEST WITH CONTRAST TECHNIQUE: Multidetector CT imaging of the chest was performed using the standard protocol during bolus administration of intravenous contrast. Multiplanar CT image reconstructions and MIPs were obtained to evaluate the vascular anatomy. CONTRAST:  137mL OMNIPAQUE IOHEXOL 350 MG/ML SOLN COMPARISON:  Chest radiograph from earlier today. 05/12/2018 chest CT. FINDINGS: Cardiovascular: The study is moderate quality for the evaluation of pulmonary embolism, with some motion degradation. There are no filling defects in the central, lobar, segmental or subsegmental pulmonary artery branches to suggest acute pulmonary embolism. Atherosclerotic thoracic aorta with stable ectatic 4.0 cm ascending thoracic aorta. Stable dilated main pulmonary artery (3.9 cm diameter). Normal heart size. No significant pericardial fluid/thickening. Right internal jugular Port-A-Cath terminates at the cavoatrial junction. Mediastinum/Nodes: No discrete thyroid nodules. Unremarkable esophagus. No axillary adenopathy. Left axillary surgical clips again noted. Enlarged 1.4 cm left prevascular node (series 11/image 34), stable. Multiple enlarged right paratracheal nodes up to 2.1 cm (series 11/image 32), mildly increased from 1.9 cm using similar measurement technique. Enlarged 2.1 cm subcarinal node (series 11/image 42), increased from 1.8 cm. Enlarged 1.8 cm left hilar node (series 11/image 36), increased from 1.4 cm. No right hilar adenopathy. Lungs/Pleura: No pneumothorax. Moderate to large left pleural effusion, predominantly basilar. No right pleural effusion. Dense masslike consolidation replacing the left upper lobe measuring up to 12.0 x 5.8 cm (series  11/image 37), not appreciably changed in size, with loss of previously seen apical left upper lobe aeration. Similar moderate to severe compressive atelectasis in the left lower lobe. Basilar right upper lobe 0.8 cm solid pulmonary nodule (series 10/image 69), increased  from 0.3 cm. Peripheral right lower lobe 1.3 cm subsolid nodule (series 10/image 60), increased from 1.1 cm and increased in density. Upper abdomen: No acute abnormality. Musculoskeletal: No aggressive appearing focal osseous lesions. Left mastectomy. Superficial subcutaneous round cystic 1.6 cm lesion in midline back (series 11/image 40), unchanged, presumably a sebaceous cyst. Mild thoracic spondylosis. Review of the MIP images confirms the above findings. IMPRESSION: 1. Motion degraded scan. No evidence of pulmonary embolism. 2. Moderate to large left pleural effusion. Dense masslike consolidation replacing the left upper lobe, not appreciably changed in size, likely representing confluent metastatic disease. Worsening aeration in the left lung. 3. Interval growth of two scattered right pulmonary nodules, suspicious for pulmonary metastases. 4. Mediastinal and left hilar adenopathy is stable to mildly increased, suspicious for metastatic nodal disease. 5. Stable ectatic 4.0 cm ascending thoracic aorta. Recommend annual imaging followup by CTA or MRA. This recommendation follows 2010 ACCF/AHA/AATS/ACR/ASA/SCA/SCAI/SIR/STS/SVM Guidelines for the Diagnosis and Management of Patients with Thoracic Aortic Disease. 2010; 121: L875-I433. 6. Stable dilated main pulmonary artery, suggesting chronic pulmonary arterial hypertension. Aortic Atherosclerosis (ICD10-I70.0). Electronically Signed   By: Ilona Sorrel M.D.   On: 06/01/2018 20:23   Dg Chest Port 1 View  Result Date: 06/01/2018 CLINICAL DATA:  Chest pain EXAM: PORTABLE CHEST 1 VIEW COMPARISON:  05/29/2018 FINDINGS: The right-sided Port-A-Cath well position. There is near complete opacification of  the left hemithorax, progressed from prior study. The right lung field is essentially clear. There is no acute osseous abnormality. The cardiac silhouette is poorly evaluated on this examination. IMPRESSION: Near complete opacification of the left hemithorax, worsened since prior study. Findings are suspicious for a growing left-sided pleural effusion with associated compressive atelectasis. Electronically Signed   By: Constance Holster M.D.   On: 06/01/2018 17:20    Pending Labs Unresulted Labs (From admission, onward)    Start     Ordered   06/01/18 2157  Magnesium  Add-on,   R     06/01/18 2156   06/01/18 1904  Troponin I - Now Then Q6H  Now then every 6 hours,   R     06/01/18 1903   Signed and Held  Magnesium  Tomorrow morning,   R    Comments:  Call MD if <1.5    Signed and Held   Signed and Held  Phosphorus  Tomorrow morning,   R     Signed and Held   Signed and Held  TSH  Once,   R    Comments:  Cancel if already done within 1 month and notify MD    Signed and Held   Signed and Held  Comprehensive metabolic panel  Once,   R    Comments:  Cal MD for K<3.5 or >5.0    Signed and Held   Signed and Held  CBC  Once,   R    Comments:  Call for hg <8.0    Signed and Held          Vitals/Pain Today's Vitals   06/01/18 1946 06/01/18 2006 06/01/18 2030 06/01/18 2119  BP: 90/70 (!) 157/95 (!) 150/94   Pulse: (!) 105 100 94   Resp: (!) 25 15 (!) 24   Temp:      TempSrc:      SpO2: 98% 95% 95%   PainSc: 7    5     Isolation Precautions No active isolations  Medications Medications  sodium chloride (PF) 0.9 % injection (has no administration in time  range)  morphine 4 MG/ML injection 4 mg (4 mg Intravenous Given 06/01/18 1742)  iohexol (OMNIPAQUE) 350 MG/ML injection 100 mL (100 mLs Intravenous Contrast Given 06/01/18 1950)  sodium chloride 0.9 % bolus 500 mL (500 mLs Intravenous New Bag/Given 06/01/18 2007)  fentaNYL (SUBLIMAZE) injection 50 mcg (50 mcg Intravenous Given  06/01/18 2059)    Mobility walks

## 2018-06-01 NOTE — ED Triage Notes (Signed)
Pt arrived via EMS from home. Pt c/o generalized chest pain, pt reports area is tender on palpation, and with breathing and movement. Pt reports that she had a Thoracentesis recently during recent hospitalization.    Pt gave herself neb treatments at home, has home Oxygen Pt has COPD and left breast Cancer that has recently returned. 98-100 RA   EMS v/s 162/100 HR 96, O2 98%, CBG 138

## 2018-06-01 NOTE — H&P (Signed)
Deborah Mcintyre ZOX:[id number] DOB: [birth year] DOA: 06/01/2018     PCP: Orpah Cobb, MD   Outpatient Specialists:     Oncology Dr. Pamelia Hoit     Patient arrived to ER on 06/01/18 at 1608  Patient coming from: home Lives   With family    Chief Complaint:  Chief Complaint  Patient presents with   Chest Pain    HPI: Deborah Mcintyre is a 63 y.o. female with medical history significant of hypertension, COPD on 2 L of oxygen at baseline, smoker, breast cancer, tobacco abuse, left-sided malignant pleural effusion  Presented with chest pain generalized worse with breathing and movement she has given herself nebulizer treatments at home On EMS arrival 9200% on room air blood pressure 162/100 CBG 138 This of breath and chest pain has been gradually getting worse and so arthrocentesis.  No associated fevers or chills shortness of breath was fine flat no leg edema nonproductive cough   Admitted for left-sided lumbar fusion underwent ultrasound-guided thoracentesis by IR twice  Removal of total 2.7 L plan was was further follow-up with IR for Pleurx catheter placement done over the weekend was plan was to do it on Tuesday. In the past she has undergone thoracentesis as well and at that time cytology showed adenocarcinoma. 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 Follows with Dr. Pamelia Hoit.sp Port-A-Cath on the right chest. Plan is to start on palliative chemotherapy.   left-sided pleural effusion due to breast cancer surgically drained during her last admission      HTN on amlodipine and metoprolol      COPD - on baseline oxygen  2L, continues to  smoke inhalers    While in ER:  The following Work up has been ordered so far:  Orders Placed This Encounter  Procedures   SARS Coronavirus 2 (CEPHEID- Performed in [facility] hospital lab), Hosp Order   DG Chest [place] 1 View   CT Angio Chest PE W and/or Wo Contrast   IR Guided Drain W Catheter Placement   CBC with Differential/Platelet   Comprehensive metabolic panel   Protime-INR   Troponin I - ONCE - STAT   Troponin I - Now Then Q6H   Diet NPO time specified   Cardiac monitoring   Consult to hospitalist   ED EKG   EKG 12-Lead   Place in observation (patient's expected length of stay will be less than 2 midnights)     Following Medications were ordered in ER: Medications  sodium chloride (PF) 0.9 % injection (has no administration in time range)  morphine 4 MG/ML injection 4 mg (4 mg Intravenous Given 06/01/18 1742)  iohexol (OMNIPAQUE) 350 MG/ML injection 100 mL (100 mLs Intravenous Contrast Given 06/01/18 1950)  sodium chloride 0.9 % bolus 500 mL (500 mLs Intravenous New Bag/Given 06/01/18 2007)  fentaNYL (SUBLIMAZE) injection 50 mcg (50 mcg Intravenous Given 06/01/18 2059)        Consult Orders  (From admission, onward)         Start     Ordered   06/01/18 1818  Consult to hospitalist  Once    Provider:  Therisa Doyne, MD  Question Answer Comment  Place call  to: [facility]   Reason for Consult Admit      06/01/18 1817           Significant initial  Findings: Abnormal Labs Reviewed  COMPREHENSIVE METABOLIC PANEL - Abnormal; Notable for the following components:      Result Value   BUN 7 (*)    Calcium 8.8 (*)    Total Protein 6.1 (*)    Albumin 3.0 (*)    AST 10 (*)    All other components within normal limits  TROPONIN I - Abnormal; Notable for the following components:   Troponin I 0.03 (*)    All other components within normal limits     Otherwise labs showing:    Recent Labs  Lab 05/27/18 1653 05/28/18 0601  06/01/18 1714  NA 142 134* 140  K 3.5 3.8 3.6  CO2 29 28 32  GLUCOSE 105* 487* 88  BUN 10 12 7*  CREATININE 0.59 0.69 0.63  CALCIUM 9.2 8.1* 8.8*    Cr stable,  Lab Results  Component Value Date   CREATININE 0.63 06/01/2018   CREATININE 0.69 05/28/2018   CREATININE 0.59 05/27/2018    Recent Labs  Lab 05/27/18 1653 06/01/18 1714  AST 10* 10*  ALT 9 10  ALKPHOS 58 49  BILITOT 0.2* 0.3  PROT 7.1 6.1*  ALBUMIN 3.5 3.0*   Lab Results  Component Value Date   CALCIUM 8.8 (L) 06/01/2018      WBC      Component Value Date/Time   WBC 5.3 06/01/2018 1714   ANC    Component Value Date/Time   NEUTROABS 3.6 06/01/2018 1714   NEUTROABS 2.2 01/07/2017 1308   ALC No results found for: LYMPHOABS    Plt: Lab Results  Component Value Date   PLT 275 06/01/2018     Lactic Acid, Venous No results found for: LATICACIDVEN     COVID-19 Labs  No results for input(s): DDIMER, FERRITIN, LDH, CRP in the last 72 hours.  Lab Results  Component Value Date   SARSCOV2NAA NEGATIVE 06/01/2018   SARSCOV2NAA NEGATIVE 05/27/2018   SARSCOV2NAA NEGATIVE 05/11/2018     HG/HCT  Stable,     Component Value Date/Time   HGB 12.9 06/01/2018 1714   HGB 14.0 01/07/2017 1308   HCT 40.5 06/01/2018 1714   HCT 43.5 01/07/2017 1308     Troponin   Cardiac Panel (last 3 results) Recent Labs    06/01/18 1714  TROPONINI 0.03*       UA  not ordered    CXR - Near complete opacification of the left hemithorax  CTA chest -  nonacute, no PE,  no evidence of infiltrate, metastatic spread to the chest likely secondary to breast cancer and large left pleural effusion   ECG:  Personally reviewed by me showing: HR :102  Rhythm:  Sinus tachycardia  , RBBB,     no evidence of ischemic changes QTC 505     ED Triage Vitals  Enc Vitals Group     BP 06/01/18 1622 (!) 144/88     Pulse Rate 06/01/18 1622 96     Resp 06/01/18 1622 18     Temp 06/01/18 1622 98.4 F (36.9 C)     Temp  Source 06/01/18 1622 Oral     SpO2 06/01/18 1622 96 %     Weight --      Height --      Head Circumference --      Peak Flow --  Pain Score 06/01/18 1619 7     Pain Loc --      Pain Edu? --      Excl. in GC? --   TMAX(24)@       Latest  Blood pressure (!) 144/88, pulse 96, temperature 98.4 F (36.9 C), temperature source Oral, resp. rate 18, SpO2 96 %.     Hospitalist was called for admission for chest  pain due to recurrent malignant pleural effusion    Review of Systems:    Pertinent positives include: shortness of breath at rest. dyspnea on exertion  Constitutional:  No weight loss, night sweats, Fevers, chills, fatigue, weight loss  HEENT:  No headaches, Difficulty swallowing,Tooth/dental problems,Sore throat,  No sneezing, itching, ear ache, nasal congestion, post nasal drip,  Cardio-vascular:  No chest pain, Orthopnea, PND, anasarca, dizziness, palpitations.no Bilateral lower extremity swelling  GI:  No heartburn, indigestion, abdominal pain, nausea, vomiting, diarrhea, change in bowel habits, loss of appetite, melena, blood in stool, hematemesis Resp:   No excess mucus, no productive cough, No non-productive cough, No coughing up of blood.No change in color of mucus.No wheezing. Skin:  no rash or lesions. No jaundice GU:  no dysuria, change in color of urine, no urgency or frequency. No straining to urinate.  No flank pain.  Musculoskeletal:  No joint pain or no joint swelling. No decreased range of motion. No back pain.  Psych:  No change in mood or affect. No depression or anxiety. No memory loss.  Neuro: no localizing neurological complaints, no tingling, no weakness, no double vision, no gait abnormality, no slurred speech, no confusion  All systems reviewed and apart from HOPI all are negative  Past Medical History:   Past Medical History:  Diagnosis Date   Anxiety    Arthritis    Bronchitis    COPD (chronic obstructive pulmonary disease)  (HCC)    Depression    Hypertension    Malignant neoplasm of upper-outer quadrant of left female breast (HCC) 01/04/2015   Nocturia    Numbness and tingling    toes - bilateral   Pneumonia    PONV (postoperative nausea and vomiting)    Nausea      Past Surgical History:  Procedure Laterality Date   BREAST LUMPECTOMY Bilateral    x3   BREAST LUMPECTOMY WITH RADIOACTIVE SEED LOCALIZATION Right 08/23/2015   Procedure: RIGHT BREAST LUMPECTOMY WITH RADIOACTIVE SEED LOCALIZATION;  Surgeon: Ovidio Kin, MD;  Location: [facility] OR;  Service: General;  Laterality: Right;   CESAREAN SECTION     x4   IR IMAGING GUIDED PORT INSERTION  05/25/2018   IR REMOVAL TUN ACCESS W/ PORT W/O FL MOD SED  10/20/2016   IR THORACENTESIS ASP PLEURAL SPACE W/IMG GUIDE  05/12/2018   MASTECTOMY Left 08/23/2015    RIGHT BREAST LUMPECTOMY WITH RADIOACTIVE SEED LOCALIZATION,  LEFT MASTECTOMY WITH AXILLARY LYMPH NODE DISSECTION   MASTECTOMY WITH AXILLARY LYMPH NODE DISSECTION Left 08/23/2015   Procedure: LEFT MASTECTOMY WITH AXILLARY LYMPH NODE DISSECTION;  Surgeon: Ovidio Kin, MD;  Location: [facility] OR;  Service: General;  Laterality: Left;   MULTIPLE TOOTH EXTRACTIONS      Social History:  Ambulatory   independently     reports that she has been smoking cigarettes. She has a 33.75 pack-year smoking history. She has never used smokeless tobacco. She reports current alcohol use. She reports that she does not use drugs.  Family History:       Family History  Problem Relation Age of  Onset   Breast cancer Maternal Aunt     Allergies: No Known Allergies   Prior to Admission medications   Medication Sig Start Date End Date Taking? Authorizing Provider  albuterol (PROVENTIL HFA;VENTOLIN HFA) 108 (90 Base) MCG/ACT inhaler Inhale 1-2 puffs into the lungs every 6 (six) hours as needed for wheezing or shortness of breath.    [provider]  albuterol (PROVENTIL) (2.5 MG/3ML) 0.083% nebulizer  solution Take 3 mLs (2.5 mg total) by nebulization every 6 (six) hours as needed for wheezing or shortness of breath. 05/15/18   Regalado, Belkys A, MD  ALPRAZolam (XANAX) 0.5 MG tablet Take 1 tablet (0.5 mg total) by mouth 3 (three) times daily as needed for anxiety or sleep. 05/15/18   Regalado, Belkys A, MD  amLODipine (NORVASC) 5 MG tablet Take 1 tablet (5 mg total) by mouth daily. 05/16/18   Regalado, Belkys A, MD  ferrous sulfate 325 (65 FE) MG tablet Take 1 tablet (325 mg total) by mouth daily with breakfast. 02/10/17   Orpah Cobb, MD  gabapentin (NEURONTIN) 300 MG capsule Take 1 capsule (300 mg total) by mouth 3 (three) times daily. 09/23/17   Serena Croissant, MD  ibuprofen (ADVIL) 200 MG tablet Take 800 mg by mouth every 6 (six) hours as needed for moderate pain.    [provider]  lidocaine (LIDODERM) 5 % Place 1 patch onto the skin daily. Remove & Discard patch within 12 hours or as directed by MD 05/29/18   Burnadette Pop, MD  lidocaine-prilocaine (EMLA) cream Apply to affected area once Patient taking differently: Apply 1 application topically daily as needed. Port 05/19/18   Serena Croissant, MD  metoprolol (LOPRESSOR) 50 MG tablet Take 0.5 tablets (25 mg total) by mouth 2 (two) times daily. 08/07/13   Orpah Cobb, MD  ondansetron (ZOFRAN) 8 MG tablet Take 1 tablet (8 mg total) by mouth every 8 (eight) hours as needed for nausea. 09/23/17   Serena Croissant, MD  oxyCODONE-acetaminophen (PERCOCET/ROXICET) 5-325 MG tablet Take 1 tablet by mouth every 8 (eight) hours as needed for moderate pain or severe pain (1 tab every 4-6 hour prn pain). Patient taking differently: Take 1 tablet by mouth every 6 (six) hours as needed for moderate pain or severe pain.  05/19/18   Serena Croissant, MD  PARoxetine (PAXIL) 20 MG tablet Take 1 tablet (20 mg total) by mouth 2 (two) times daily. 05/15/18   Regalado, Prentiss Bells, MD  PREMARIN vaginal cream INSERT 1 APPLICATORFUL VAGINALLY DAILY Patient taking differently:  Place 1 Applicatorful vaginally daily.  04/19/17   Serena Croissant, MD  prochlorperazine (COMPAZINE) 10 MG tablet Take 1 tablet (10 mg total) by mouth every 6 (six) hours as needed (Nausea or vomiting). 05/19/18   Serena Croissant, MD   Physical Exam: Blood pressure (!) 144/88, pulse 96, temperature 98.4 F (36.9 C), temperature source Oral, resp. rate 18, SpO2 96 %. 1. General:  in No Acute distress    Chronically ill -appearing 2. Psychological: Alert and   Oriented 3. Head/ENT:     Dry Mucous Membranes                          Head Non traumatic, neck supple                            Poor Dentition 4. SKIN:  decreased Skin turgor,  Skin clean Dry and intact no rash  5. Heart: Regular rate and rhythm no  Murmur, no Rub or gallop 6. Lungs:  , no wheezes or crackles, diminished on the left    7. Abdomen: Soft,  non-tender, Non distended   Obese bowel sounds present 8. Lower extremities: no clubbing, cyanosis, no  edema 9. Neurologically Grossly intact, moving all 4 extremities equally   10. MSK: Normal range of motion   All other LABS:     Recent Labs  Lab 05/27/18 1653 05/28/18 0601 06/01/18 1714  WBC 8.6 5.2 5.3  NEUTROABS 7.8*  --  3.6  HGB 13.3 11.4* 12.9  HCT 42.3 37.2 40.5  MCV 98.4 100.5* 96.7  PLT 280 290 275     Recent Labs  Lab 05/27/18 1653 05/28/18 0601 06/01/18 1714  NA 142 134* 140  K 3.5 3.8 3.6  CL 102 98 100  CO2 29 28 32  GLUCOSE 105* 487* 88  BUN 10 12 7*  CREATININE 0.59 0.69 0.63  CALCIUM 9.2 8.1* 8.8*     Recent Labs  Lab 05/27/18 1653 06/01/18 1714  AST 10* 10*  ALT 9 10  ALKPHOS 58 49  BILITOT 0.2* 0.3  PROT 7.1 6.1*  ALBUMIN 3.5 3.0*       Cultures:    Component Value Date/Time   SDES PLEURAL 05/12/2018 1250   SPECREQUEST NONE 05/12/2018 1250   CULT  05/12/2018 1250    NO GROWTH 5 DAYS Performed at [facility], [address] [place], [place] [address]    REPTSTATUS 05/17/2018 FINAL 05/12/2018 1250     Radiological  Exams on Admission: Ct Angio Chest Pe W And/or Wo Contrast  Result Date: 06/01/2018 CLINICAL DATA:  Dyspnea, multiple recent left thoracentesis procedures, history of left breast cancer. Cytology from 05/12/2018 left thoracentesis demonstrated adenocarcinoma. EXAM: CT ANGIOGRAPHY CHEST WITH CONTRAST TECHNIQUE: Multidetector CT imaging of the chest was performed using the standard protocol during bolus administration of intravenous contrast. Multiplanar CT image reconstructions and MIPs were obtained to evaluate the vascular anatomy. CONTRAST:  OMNIPAQUE IOHEXOL 350 MG/ML SOLN COMPARISON:  Chest radiograph from earlier today. 05/12/2018 chest CT. FINDINGS: Cardiovascular: The study is moderate quality for the evaluation of pulmonary embolism, with some motion degradation. There are no filling defects in the central, lobar, segmental or subsegmental pulmonary artery branches to suggest acute pulmonary embolism. Atherosclerotic thoracic aorta with stable ectatic 4.0 cm ascending thoracic aorta. Stable dilated main pulmonary artery (3.9 cm diameter). Normal heart size. No significant pericardial fluid/thickening. Right internal jugular Port-A-Cath terminates at the cavoatrial junction. Mediastinum/Nodes: No discrete thyroid nodules. Unremarkable esophagus. No axillary adenopathy. Left axillary surgical clips again noted. Enlarged 1.4 cm left prevascular node (series 11/image 34), stable. Multiple enlarged right paratracheal nodes up to 2.1 cm (series 11/image 32), mildly increased from 1.9 cm using similar measurement technique. Enlarged 2.1 cm subcarinal node (series 11/image 42), increased from 1.8 cm. Enlarged 1.8 cm left hilar node (series 11/image 36), increased from 1.4 cm. No right hilar adenopathy. Lungs/Pleura: No pneumothorax. Moderate to large left pleural effusion, predominantly basilar. No right pleural effusion. Dense masslike consolidation replacing the left upper lobe measuring up to 12.0 x 5.8  cm (series 11/image 37), not appreciably changed in size, with loss of previously seen apical left upper lobe aeration. Similar moderate to severe compressive atelectasis in the left lower lobe. Basilar right upper lobe 0.8 cm solid pulmonary nodule (series 10/image 69), increased from 0.3 cm. Peripheral right lower lobe 1.3 cm subsolid nodule (series 10/image 60),  increased from 1.1 cm and increased in density. Upper abdomen: No acute abnormality. Musculoskeletal: No aggressive appearing focal osseous lesions. Left mastectomy. Superficial subcutaneous round cystic 1.6 cm lesion in midline back (series 11/image 40), unchanged, presumably a sebaceous cyst. Mild thoracic spondylosis. Review of the MIP images confirms the above findings. IMPRESSION: 1. Motion degraded scan. No evidence of pulmonary embolism. 2. Moderate to large left pleural effusion. Dense masslike consolidation replacing the left upper lobe, not appreciably changed in size, likely representing confluent metastatic disease. Worsening aeration in the left lung. 3. Interval growth of two scattered right pulmonary nodules, suspicious for pulmonary metastases. 4. Mediastinal and left hilar adenopathy is stable to mildly increased, suspicious for metastatic nodal disease. 5. Stable ectatic 4.0 cm ascending thoracic aorta. Recommend annual imaging followup by CTA or MRA. This recommendation follows 2010 ACCF/AHA/AATS/ACR/ASA/SCA/SCAI/SIR/STS/SVM Guidelines for the Diagnosis and Management of Patients with Thoracic Aortic Disease. 2010; 121: Z610-R604. 6. Stable dilated main pulmonary artery, suggesting chronic pulmonary arterial hypertension. Aortic Atherosclerosis (ICD10-I70.0). Electronically Signed   By: Delbert Phenix M.D.   On: 06/01/2018 20:23   Dg Chest Port 1 View  Result Date: 06/01/2018 CLINICAL DATA:  Chest pain EXAM: PORTABLE CHEST 1 VIEW COMPARISON:  05/29/2018 FINDINGS: The right-sided Port-A-Cath well position. There is near complete  opacification of the left hemithorax, progressed from prior study. The right lung field is essentially clear. There is no acute osseous abnormality. The cardiac silhouette is poorly evaluated on this examination. IMPRESSION: Near complete opacification of the left hemithorax, worsened since prior study. Findings are suspicious for a growing left-sided pleural effusion with associated compressive atelectasis. Electronically Signed   By: Katherine Mantle M.D.   On: 06/01/2018 17:20    Chart has been reviewed    Assessment/Plan   63 y.o. female with medical history significant of hypertension, COPD on 2 L of oxygen at baseline, smoker, breast cancer, tobacco abuse, left-sided malignant pleural effusion  Admitted for left pleural effusion secondary to metastatic spread  Present on Admission:  Breast cancer of upper-outer quadrant of left female breast (HCC) -likely metastatic to the chest tube now pleural effusion recurrent.  Will notify Dr. Samson Frederic through epic the patient has been admitted  Malignant pleural effusion -likely need, repeat thoracentesis and Pleurx catheter placement order IR consult   Hypertension -chronic stable continue home medications  COPD (chronic obstructive pulmonary disease) (HCC) -currently stable at baseline we will continue to monitor  Anxiety and depression -chronic continue home medications  Tobacco abuse -order nicotine patch and tobacco cessation protocol  Prolonged QT interval- - will monitor on tele avoid QT prolonging medications, rehydrate correct electrolytes   Chest pain most likely noncardiac withdrawal secondary to pleural effusion usually improves after thoracentesis and pain medication  Elevated troponin -chronically elevated continue to monitor but currently stable no EKG changes suspicious for ischemia chronic chest pain no change from prior  Debility - Family was concerned regarding patient's ability to ambulate will get PT OT to evaluate  her for safe discharge Other plan as per orders.  DVT prophylaxis:  SCD    Code Status:  FULL CODE  Family Communication:   Family not at  Bedside    Disposition Plan:     To home once workup is complete and patient is stable                    Would benefit from PT/OT eval prior to DC  Ordered  Consults called: none, IR procedure ordered   Admission status:  ED Disposition    ED Disposition Condition Comment   Admit  Hospital Area: [facility] [[id number]]  Level of Care: Telemetry [5]  Admit to tele based on following criteria: Monitor QTC interval  Covid Evaluation: N/A  Diagnosis: Malignant pleural effusion [511.81.ICD-9-CM]  Admitting Physician: Therisa Doyne [[id number]]  Attending Physician: Therisa Doyne [[id number]]  PT Class (Do Not Modify): Observation [104]  PT Acc Code (Do Not Modify): Observation [10022]         Obs    Level of care     tele  For 12H   Precautions:  NONE  No active isolations  PPE: Used by the provider:   P100  eye Goggles,  Gloves      Aroush Chasse 06/01/2018, 9:51 PM    [facility]     after 2 AM please page floor coverage PA If 7AM-7PM, please contact the day team taking care of the patient using [email or web address]

## 2018-06-02 ENCOUNTER — Encounter (HOSPITAL_COMMUNITY): Payer: Self-pay

## 2018-06-02 ENCOUNTER — Observation Stay (HOSPITAL_COMMUNITY): Payer: Medicare Other

## 2018-06-02 ENCOUNTER — Encounter: Payer: Self-pay | Admitting: *Deleted

## 2018-06-02 ENCOUNTER — Encounter (HOSPITAL_COMMUNITY): Payer: Medicare Other

## 2018-06-02 DIAGNOSIS — Z803 Family history of malignant neoplasm of breast: Secondary | ICD-10-CM | POA: Diagnosis not present

## 2018-06-02 DIAGNOSIS — Z938 Other artificial opening status: Secondary | ICD-10-CM

## 2018-06-02 DIAGNOSIS — Z9012 Acquired absence of left breast and nipple: Secondary | ICD-10-CM | POA: Diagnosis not present

## 2018-06-02 DIAGNOSIS — R9431 Abnormal electrocardiogram [ECG] [EKG]: Secondary | ICD-10-CM | POA: Diagnosis present

## 2018-06-02 DIAGNOSIS — R079 Chest pain, unspecified: Secondary | ICD-10-CM | POA: Diagnosis present

## 2018-06-02 DIAGNOSIS — M1990 Unspecified osteoarthritis, unspecified site: Secondary | ICD-10-CM | POA: Diagnosis present

## 2018-06-02 DIAGNOSIS — F419 Anxiety disorder, unspecified: Secondary | ICD-10-CM | POA: Diagnosis present

## 2018-06-02 DIAGNOSIS — C50412 Malignant neoplasm of upper-outer quadrant of left female breast: Secondary | ICD-10-CM | POA: Diagnosis present

## 2018-06-02 DIAGNOSIS — F17210 Nicotine dependence, cigarettes, uncomplicated: Secondary | ICD-10-CM | POA: Diagnosis present

## 2018-06-02 DIAGNOSIS — Z9981 Dependence on supplemental oxygen: Secondary | ICD-10-CM | POA: Diagnosis not present

## 2018-06-02 DIAGNOSIS — Z20828 Contact with and (suspected) exposure to other viral communicable diseases: Secondary | ICD-10-CM | POA: Diagnosis present

## 2018-06-02 DIAGNOSIS — I10 Essential (primary) hypertension: Secondary | ICD-10-CM | POA: Diagnosis present

## 2018-06-02 DIAGNOSIS — F329 Major depressive disorder, single episode, unspecified: Secondary | ICD-10-CM | POA: Diagnosis present

## 2018-06-02 DIAGNOSIS — Z7982 Long term (current) use of aspirin: Secondary | ICD-10-CM | POA: Diagnosis not present

## 2018-06-02 DIAGNOSIS — J449 Chronic obstructive pulmonary disease, unspecified: Secondary | ICD-10-CM | POA: Diagnosis present

## 2018-06-02 DIAGNOSIS — J910 Malignant pleural effusion: Secondary | ICD-10-CM | POA: Diagnosis present

## 2018-06-02 HISTORY — PX: IR GUIDED DRAIN W CATHETER PLACEMENT: IMG719

## 2018-06-02 LAB — COMPREHENSIVE METABOLIC PANEL
ALT: 8 U/L (ref 0–44)
AST: 11 U/L — ABNORMAL LOW (ref 15–41)
Albumin: 3.1 g/dL — ABNORMAL LOW (ref 3.5–5.0)
Alkaline Phosphatase: 50 U/L (ref 38–126)
Anion gap: 6 (ref 5–15)
BUN: 7 mg/dL — ABNORMAL LOW (ref 8–23)
CO2: 31 mmol/L (ref 22–32)
Calcium: 8.7 mg/dL — ABNORMAL LOW (ref 8.9–10.3)
Chloride: 102 mmol/L (ref 98–111)
Creatinine, Ser: 0.66 mg/dL (ref 0.44–1.00)
GFR calc Af Amer: >60 mL/min (ref >60)
GFR calc non Af Amer: >60 mL/min (ref >60)
Glucose, Bld: 92 mg/dL (ref 70–99)
Potassium: 3.7 mmol/L (ref 3.5–5.1)
Sodium: 139 mmol/L (ref 135–145)
Total Bilirubin: 0.5 mg/dL (ref 0.3–1.2)
Total Protein: 6.1 g/dL — ABNORMAL LOW (ref 6.5–8.1)

## 2018-06-02 LAB — TSH: TSH: 3.265 u[IU]/mL (ref 0.350–4.500)

## 2018-06-02 LAB — CBC
HCT: 41.1 % (ref 36.0–46.0)
Hemoglobin: 13 g/dL (ref 12.0–15.0)
MCH: 30.4 pg (ref 26.0–34.0)
MCHC: 31.6 g/dL (ref 30.0–36.0)
MCV: 96.3 fL (ref 80.0–100.0)
Platelets: 287 10*3/uL (ref 150–400)
RBC: 4.27 MIL/uL (ref 3.87–5.11)
RDW: 12.7 % (ref 11.5–15.5)
WBC: 5.7 10*3/uL (ref 4.0–10.5)
nRBC: 0 % (ref 0.0–0.2)

## 2018-06-02 LAB — MAGNESIUM: Magnesium: 2.1 mg/dL (ref 1.7–2.4)

## 2018-06-02 LAB — TROPONIN I
Troponin I: 0.03 ng/mL (ref <0.03)
Troponin I: 0.03 ng/mL (ref <0.03)

## 2018-06-02 LAB — PHOSPHORUS: Phosphorus: 3.6 mg/dL (ref 2.5–4.6)

## 2018-06-02 MED ORDER — ENSURE ENLIVE PO LIQD
237.0000 mL | Freq: Two times a day (BID) | ORAL | Status: DC
  Administered 2018-06-02 – 2018-06-03 (×2): 237 mL via ORAL

## 2018-06-02 MED ORDER — SODIUM CHLORIDE 0.9% FLUSH: 10.0000 mL | INTRAVENOUS | Status: DC | PRN

## 2018-06-02 MED ORDER — MIDAZOLAM HCL 2 MG/2ML IJ SOLN
INTRAMUSCULAR | Status: AC
  Filled 2018-06-02: qty 4

## 2018-06-02 MED ORDER — FENTANYL CITRATE (PF) 100 MCG/2ML IJ SOLN
INTRAMUSCULAR | Status: AC | PRN
  Administered 2018-06-02: 50 ug via INTRAVENOUS

## 2018-06-02 MED ORDER — CEFAZOLIN SODIUM-DEXTROSE 2-4 GM/100ML-% IV SOLN
INTRAVENOUS | Status: AC
  Administered 2018-06-02: 2 g via INTRAVENOUS
  Filled 2018-06-02: qty 100

## 2018-06-02 MED ORDER — LIDOCAINE-EPINEPHRINE (PF) 1 %-1:200000 IJ SOLN
INTRAMUSCULAR | Status: AC | PRN
  Administered 2018-06-02: 10 mL

## 2018-06-02 MED ORDER — HYDROMORPHONE HCL 1 MG/ML IJ SOLN
0.5000 mg | INTRAMUSCULAR | Status: DC | PRN
  Administered 2018-06-02 – 2018-06-03 (×5): 0.5 mg via INTRAVENOUS
  Filled 2018-06-02 (×5): qty 0.5

## 2018-06-02 MED ORDER — MIDAZOLAM HCL 2 MG/2ML IJ SOLN
INTRAMUSCULAR | Status: AC | PRN
  Administered 2018-06-02: 1 mg via INTRAVENOUS

## 2018-06-02 MED ORDER — LIDOCAINE HCL 1 % IJ SOLN
INTRAMUSCULAR | Status: AC
  Filled 2018-06-02: qty 20

## 2018-06-02 MED ORDER — PRO-STAT SUGAR FREE PO LIQD
30.0000 mL | Freq: Two times a day (BID) | ORAL | Status: DC
  Administered 2018-06-02 – 2018-06-03 (×2): 30 mL via ORAL
  Filled 2018-06-02 (×2): qty 30

## 2018-06-02 MED ORDER — FENTANYL CITRATE (PF) 100 MCG/2ML IJ SOLN
INTRAMUSCULAR | Status: AC
  Filled 2018-06-02: qty 2

## 2018-06-02 MED ORDER — METOPROLOL TARTRATE 25 MG PO TABS: 25.0000 mg | ORAL_TABLET | Freq: Two times a day (BID) | ORAL | 0 refills | Status: DC

## 2018-06-02 MED ORDER — ADULT MULTIVITAMIN W/MINERALS CH
1.0000 | ORAL_TABLET | Freq: Every day | ORAL | Status: DC
  Administered 2018-06-02 – 2018-06-03 (×2): 1 via ORAL
  Filled 2018-06-02 (×2): qty 1

## 2018-06-02 MED ORDER — CEFAZOLIN SODIUM-DEXTROSE 2-4 GM/100ML-% IV SOLN
2.0000 g | INTRAVENOUS | Status: AC
  Administered 2018-06-02: 13:00:00 2 g via INTRAVENOUS

## 2018-06-02 NOTE — Progress Notes (Signed)
PROGRESS NOTE    Deborah Mcintyre  EXH:[id number] DOB: 20-Jul-1955 DOA: 06/01/2018 PCP: [name], MD  Brief Narrative: 63 y.o. female with medical history significant of hypertension, COPD on 2 L of oxygen at baseline, smoker, breast cancer, tobacco abuse, left-sided malignant pleural effusion  Presented with chest pain generalized worse with breathing and movement she has given herself nebulizer treatments at home On EMS arrival 9200% on room air blood pressure 162/100 CBG 138 This of breath and chest pain has been gradually getting worse and so arthrocentesis.  No associated fevers or chills shortness of breath was fine flat no leg edema nonproductive cough   Admitted for left-sided lumbar fusion underwent ultrasound-guided thoracentesis by IR twice  Removal of total 2.7 L plan was was further follow-up with IR for Pleurx catheter placement done over the weekend was plan was to do it on Tuesday. In the past she has undergone thoracentesis as well and at that time cytology showed adenocarcinoma. 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  Now with recurrent left pleural effusion thought to be malignancy related.  Patient has had multiple hospital admissions for the same.  She had 3 admissions just this month May 2020.  All the 3 time she was admitted with recurrent symptomatic malignant left pleural effusion.  She was just discharged home 05/29/2018.  IR placed tunneled left-sided pleural drain and a large volume left-sided thoracentesis  performed following drain placement.  Appreciate IR.  Patient requires overnight monitoring for replacement of the tunneled catheter and teaching how to use the catheter. Patient and family very concerned about going home tonight and would like t be discharged tomorrow.  #2 COPD with ongoing tobacco abuse continue nebulizers  #3 hypertension continue current treatment blood pressure stable  #4 chronically elevated troponin patient has no symptoms  Nutrition Problem: Increased nutrient needs Etiology: chronic illness, cancer and cancer related treatments     Signs/Symptoms: estimated needs    Interventions: Ensure Enlive (each supplement provides 350kcal and 20 grams of protein), Prostat, MVI  Estimated body mass index is 27.96 kg/m as calculated from the following:   Height as of this encounter: 5' 7.5" (1.715 m).   Weight as of this encounter: 82.2 kg.   Subjective:   Objective: Vitals:   06/02/18 1335 06/02/18 1340 06/02/18 1345 06/02/18 1414  BP: 116/81 117/80 (!) 150/111 110/80  Pulse:    71  Resp: 14 10 (!) 21 18  Temp:    98.4 F (36.9 C)  TempSrc:    Oral  SpO2:    99%  Weight:      Height:        Intake/Output Summary (Last 24 hours) at 06/02/2018 1426 Last data filed at 06/02/2018 0200 Gross per 24 hour  Intake 140 ml  Output --  Net 140 ml   Filed Weights   06/01/18 2222  Weight: 82.2 kg    Examination:  General exam: Appears calm and comfortable  Respiratory system: Clear to auscultation. Respiratory effort normal. Cardiovascular system: S1 & S2 heard, RRR.  No JVD, murmurs, rubs, gallops or clicks. No pedal edema. Gastrointestinal system: Abdomen is nondistended, soft and nontender. No organomegaly or masses felt. Normal bowel sounds heard. Central nervous system: Alert and oriented. No focal neurological deficits. Extremities: Symmetric 5 x 5 power. Skin: No rashes, lesions or ulcers Psychiatry: Judgement and insight appear normal. Mood &  affect appropriate.     Data Reviewed: I have personally reviewed following labs and imaging studies  CBC: Recent Labs  Lab 05/27/18 1653 05/28/18 0601 06/01/18 1714 06/02/18 0110  WBC 8.6 5.2 5.3 5.7  NEUTROABS 7.8*  --  3.6  --   HGB 13.3 11.4* 12.9 13.0  HCT 42.3 37.2 40.5 41.1  MCV 98.4 100.5* 96.7 96.3  PLT 280 290 275 419   Basic Metabolic Panel: Recent Labs  Lab 05/27/18 1653 05/28/18 0601 06/01/18 1714 06/02/18 0110  NA 142 134* 140 139  K 3.5 3.8 3.6 3.7  CL 102 98 100 102  CO2 29 28 32 31  GLUCOSE 105* 487* 88 92  BUN 10 12 7* 7*  CREATININE 0.59 0.69 0.63 0.66  CALCIUM 9.2 8.1* 8.8* 8.7*  MG  --   --   --  2.1  PHOS  --   --   --  3.6   GFR: Estimated Creatinine Clearance: 80.2 mL/min (by C-G formula based on SCr of 0.66 mg/dL). Liver Function Tests: Recent Labs  Lab 05/27/18 1653 06/01/18 1714 06/02/18 0110  AST 10* 10* 11*  ALT 9 10 8   ALKPHOS 58 49 50  BILITOT 0.2* 0.3 0.5  PROT 7.1 6.1* 6.1*  ALBUMIN 3.5 3.0* 3.1*   No results for input(s): LIPASE, AMYLASE in the last 168 hours. No results for input(s): AMMONIA in the last 168 hours. Coagulation Profile: Recent Labs  Lab 06/01/18 1714  INR 1.0   Cardiac Enzymes: Recent Labs  Lab 06/01/18 1714 06/02/18 0110 06/02/18 0616  TROPONINI 0.03* 0.03* 0.03*   BNP (last 3 results) No results for input(s): PROBNP in the last 8760 hours. HbA1C: No results for input(s): HGBA1C in the last 72 hours. CBG: No results for input(s): GLUCAP in the last 168 hours. Lipid Profile: No results for input(s): CHOL, HDL, LDLCALC, TRIG, CHOLHDL, LDLDIRECT in the last 72 hours. Thyroid Function Tests: Recent Labs    06/02/18 0110  TSH 3.265   Anemia Panel: No results for input(s): VITAMINB12, FOLATE, FERRITIN, TIBC, IRON, RETICCTPCT in the last 72 hours. Sepsis Labs: No results for input(s): PROCALCITON, LATICACIDVEN in the last 168 hours.  Recent Results (from the past 240 hour(s))  SARS  Coronavirus 2 (CEPHEID - Performed in [place] hospital lab), Hosp Order     Status: None   Collection Time: 05/27/18  4:53 PM  Result Value Ref Range Status   SARS Coronavirus 2 NEGATIVE NEGATIVE Final    Comment: (NOTE) If result is NEGATIVE SARS-CoV-2 target nucleic acids are NOT DETECTED. The SARS-CoV-2 RNA is generally detectable in upper and lower  respiratory specimens during the acute phase of infection. The lowest  concentration of SARS-CoV-2 viral copies this assay can detect is 250  copies / mL. A negative result does not preclude SARS-CoV-2 infection  and should not be used as the sole basis for treatment or other  patient management decisions.  A negative result may occur with  improper specimen collection / handling, submission of specimen other  than nasopharyngeal swab, presence of viral mutation(s) within the  areas targeted by this assay, and inadequate number of viral copies  (<  250 copies / mL). A negative result must be combined with clinical  observations, patient history, and epidemiological information. If result is POSITIVE SARS-CoV-2 target nucleic acids are DETECTED. The SARS-CoV-2 RNA is generally detectable in upper and lower  respiratory specimens dur ing the acute phase of infection.  Positive  results are indicative of active infection with SARS-CoV-2.  Clinical  correlation with patient history and other diagnostic information is  necessary to determine patient infection status.  Positive results do  not rule out bacterial infection or co-infection with other viruses. If result is PRESUMPTIVE POSTIVE SARS-CoV-2 nucleic acids MAY BE PRESENT.   A presumptive positive result was obtained on the submitted specimen  and confirmed on repeat testing.  While 2019 novel coronavirus  (SARS-CoV-2) nucleic acids may be present in the submitted sample  additional confirmatory testing may be necessary for epidemiological  and / or clinical management purposes  to  differentiate between  SARS-CoV-2 and other Sarbecovirus currently known to infect humans.  If clinically indicated additional testing with an alternate test  methodology [phone]) is advised. The SARS-CoV-2 RNA is generally  detectable in upper and lower respiratory sp ecimens during the acute  phase of infection. The expected result is Negative. Fact Sheet for Patients:  StrictlyIdeas.no Fact Sheet for Healthcare Providers: BankingDealers.co.za This test is not yet approved or cleared by the [place] FDA and has been authorized for detection and/or diagnosis of SARS-CoV-2 by FDA under an Emergency Use Authorization (EUA).  This EUA will remain in effect (meaning this test can be used) for the duration of the COVID-19 declaration under Section 564(b)(1) of the Act, 21 U.S.C. section 360bbb-3(b)(1), unless the authorization is terminated or revoked sooner. Performed at [facility], [place] [address] [place], [place] [address]   SARS Coronavirus 2 (CEPHEID- Performed in [place] hospital lab), Hosp Order     Status: None   Collection Time: 06/01/18  5:15 PM  Result Value Ref Range Status   SARS Coronavirus 2 NEGATIVE NEGATIVE Final    Comment: (NOTE) If result is NEGATIVE SARS-CoV-2 target nucleic acids are NOT DETECTED. The SARS-CoV-2 RNA is generally detectable in upper and lower  respiratory specimens during the acute phase of infection. The lowest  concentration of SARS-CoV-2 viral copies this assay can detect is 250  copies / mL. A negative result does not preclude SARS-CoV-2 infection  and should not be used as the sole basis for treatment or other  patient management decisions.  A negative result may occur with  improper specimen collection / handling, submission of specimen other  than nasopharyngeal swab, presence of viral mutation(s) within the  areas targeted by this assay, and inadequate number of  viral copies  (<250 copies / mL). A negative result must be combined with clinical  observations, patient history, and epidemiological information. If result is POSITIVE SARS-CoV-2 target nucleic acids are DETECTED. The SARS-CoV-2 RNA is generally detectable in upper and lower  respiratory specimens dur ing the acute phase of infection.  Positive  results are indicative of active infection with SARS-CoV-2.  Clinical  correlation with patient history and other diagnostic information is  necessary to determine patient infection status.  Positive results do  not rule out bacterial infection or co-infection with other viruses. If result is PRESUMPTIVE POSTIVE SARS-CoV-2 nucleic acids MAY BE PRESENT.   A presumptive positive result was obtained on the submitted specimen  and confirmed on repeat testing.  While 2019 novel coronavirus  (SARS-CoV-2) nucleic acids may  be present in the submitted sample  additional confirmatory testing may be necessary for epidemiological  and / or clinical management purposes  to differentiate between  SARS-CoV-2 and other Sarbecovirus currently known to infect humans.  If clinically indicated additional testing with an alternate test  methodology [phone]) is advised. The SARS-CoV-2 RNA is generally  detectable in upper and lower respiratory sp ecimens during the acute  phase of infection. The expected result is Negative. Fact Sheet for Patients:  StrictlyIdeas.no Fact Sheet for Healthcare Providers: BankingDealers.co.za This test is not yet approved or cleared by the [place] FDA and has been authorized for detection and/or diagnosis of SARS-CoV-2 by FDA under an Emergency Use Authorization (EUA).  This EUA will remain in effect (meaning this test can be used) for the duration of the COVID-19 declaration under Section 564(b)(1) of the Act, 21 U.S.C. section 360bbb-3(b)(1), unless the authorization is  terminated or revoked sooner. Performed at [facility], [place] [address], [place], [place] [address]          Radiology Studies: Ct Angio Chest Pe W And/or Wo Contrast  Result Date: 06/01/2018 CLINICAL DATA:  Dyspnea, multiple recent left thoracentesis procedures, history of left breast cancer. Cytology from 05/12/2018 left thoracentesis demonstrated adenocarcinoma. EXAM: CT ANGIOGRAPHY CHEST WITH CONTRAST TECHNIQUE: Multidetector CT imaging of the chest was performed using the standard protocol during bolus administration of intravenous contrast. Multiplanar CT image reconstructions and MIPs were obtained to evaluate the vascular anatomy. CONTRAST:  167mL OMNIPAQUE IOHEXOL 350 MG/ML SOLN COMPARISON:  Chest radiograph from earlier today. 05/12/2018 chest CT. FINDINGS: Cardiovascular: The study is moderate quality for the evaluation of pulmonary embolism, with some motion degradation. There are no filling defects in the central, lobar, segmental or subsegmental pulmonary artery branches to suggest acute pulmonary embolism. Atherosclerotic thoracic aorta with stable ectatic 4.0 cm ascending thoracic aorta. Stable dilated main pulmonary artery (3.9 cm diameter). Normal heart size. No significant pericardial fluid/thickening. Right internal jugular Port-A-Cath terminates at the cavoatrial junction. Mediastinum/Nodes: No discrete thyroid nodules. Unremarkable esophagus. No axillary adenopathy. Left axillary surgical clips again noted. Enlarged 1.4 cm left prevascular node (series 11/image 34), stable. Multiple enlarged right paratracheal nodes up to 2.1 cm (series 11/image 32), mildly increased from 1.9 cm using similar measurement technique. Enlarged 2.1 cm subcarinal node (series 11/image 42), increased from 1.8 cm. Enlarged 1.8 cm left hilar node (series 11/image 36), increased from 1.4 cm. No right hilar adenopathy. Lungs/Pleura: No pneumothorax. Moderate to large left pleural  effusion, predominantly basilar. No right pleural effusion. Dense masslike consolidation replacing the left upper lobe measuring up to 12.0 x 5.8 cm (series 11/image 37), not appreciably changed in size, with loss of previously seen apical left upper lobe aeration. Similar moderate to severe compressive atelectasis in the left lower lobe. Basilar right upper lobe 0.8 cm solid pulmonary nodule (series 10/image 69), increased from 0.3 cm. Peripheral right lower lobe 1.3 cm subsolid nodule (series 10/image 60), increased from 1.1 cm and increased in density. Upper abdomen: No acute abnormality. Musculoskeletal: No aggressive appearing focal osseous lesions. Left mastectomy. Superficial subcutaneous round cystic 1.6 cm lesion in midline back (series 11/image 40), unchanged, presumably a sebaceous cyst. Mild thoracic spondylosis. Review of the MIP images confirms the above findings. IMPRESSION: 1. Motion degraded scan. No evidence of pulmonary embolism. 2. Moderate to large left pleural effusion. Dense masslike consolidation replacing the left upper lobe, not appreciably changed in size, likely representing confluent metastatic disease. Worsening aeration in the left lung.  3. Interval growth of two scattered right pulmonary nodules, suspicious for pulmonary metastases. 4. Mediastinal and left hilar adenopathy is stable to mildly increased, suspicious for metastatic nodal disease. 5. Stable ectatic 4.0 cm ascending thoracic aorta. Recommend annual imaging followup by CTA or MRA. This recommendation follows 2010 ACCF/AHA/AATS/ACR/ASA/SCA/SCAI/SIR/STS/SVM Guidelines for the Diagnosis and Management of Patients with Thoracic Aortic Disease. 2010; 121: K270-W237. 6. Stable dilated main pulmonary artery, suggesting chronic pulmonary arterial hypertension. Aortic Atherosclerosis (ICD10-I70.0). Electronically Signed   By: Ilona Sorrel M.D.   On: 06/01/2018 20:23   Dg Chest Port 1 View  Result Date: 06/01/2018 CLINICAL DATA:   Chest pain EXAM: PORTABLE CHEST 1 VIEW COMPARISON:  05/29/2018 FINDINGS: The right-sided Port-A-Cath well position. There is near complete opacification of the left hemithorax, progressed from prior study. The right lung field is essentially clear. There is no acute osseous abnormality. The cardiac silhouette is poorly evaluated on this examination. IMPRESSION: Near complete opacification of the left hemithorax, worsened since prior study. Findings are suspicious for a growing left-sided pleural effusion with associated compressive atelectasis. Electronically Signed   By: Constance Holster M.D.   On: 06/01/2018 17:20        Scheduled Meds:  amLODipine  5 mg Oral Daily   docusate sodium  100 mg Oral BID   feeding supplement (ENSURE ENLIVE)  237 mL Oral BID BM   feeding supplement (PRO-STAT SUGAR FREE 64)  30 mL Oral BID   fentaNYL       gabapentin  300 mg Oral TID   metoprolol tartrate  25 mg Oral BID   midazolam       multivitamin with minerals  1 tablet Oral Daily   PARoxetine  20 mg Oral BID   senna  1 tablet Oral BID   sodium chloride flush  3 mL Intravenous Q12H   Continuous Infusions:  sodium chloride       LOS: 0 days       Georgette Shell, MD [facility]  If 7PM-7AM, please contact night-coverage [email or web address] Password [email or web address] 06/02/2018, 2:26 PM

## 2018-06-02 NOTE — Procedures (Signed)
Pre procedural Dx: Metastatic breast cancer with recurrent symptomatic left sided pleural effusion Post procedural Dx: Same  Technically successful image guided placement of a tunneled left sided pleural drain. Large volume left sided thoracentesis performed following drain placement.  EBL: Minimal  Complications: None immediate  [name], MD Pager #: [phone]

## 2018-06-02 NOTE — Progress Notes (Signed)
Initial Nutrition Assessment  RD working remotely.   DOCUMENTATION CODES:   Not applicable  INTERVENTION:  - will order Ensure Enlive BID for once diet advanced to at least FLD, each supplement provides 350 kcal and 20 grams of protein. - will order 30 mL Prostat BID, each supplement provides 100 kcal and 15 grams of protein. - will order daily multivitamin with minerals. - diet advancement as medically feasible.    NUTRITION DIAGNOSIS:   Increased nutrient needs related to chronic illness, cancer and cancer related treatments as evidenced by estimated needs.  GOAL:   Patient will meet greater than or equal to 90% of their needs  MONITOR:   Diet advancement, PO intake, Supplement acceptance, Labs, Weight trends  REASON FOR ASSESSMENT:   Malnutrition Screening Tool  ASSESSMENT:   63 y.o. female with medical history significant of HTN, COPD on 2L O2 at baseline, smoker/tobacco abuse, breast cancer, and L-sided malignant pleural effusion. She presented to the ED with chest pain which is worse with breathing and movement. Patient was admitted a few days ago and underweight underwent ultrasound-guided thoracentesis by IR x2 with the removal of 2.7L total. Plan had then been made for patient to undergo Pleurx cath placement earlier this week. Patient was COVID-19 negative in the ED.  Patient has been NPO since midnight. Patient is out of her room to IR for pleurex catheter placement. Unable to obtain any information at this time. Patient was seen remotely by this RD on 5/24 at which time patient was eating 75% of meals, on average.   Per chart review, current weight is 181 lb and weight on 5/10 was 184 lb; change likely d/t 2.7L fluid removal. Weight on 09/23/17 was 200 lb which indicates 19 lb weight loss (9.5% body weight) compared to current weight; not significant for 8 month time frame.    Medications reviewed; 100 mg colace BID, 1 tablet senokot BID. Labs reviewed; BUN: 7  mg/dl, Ca: 8.7 mg/dl.     NUTRITION - FOCUSED PHYSICAL EXAM:  unable to complete at this time.   Diet Order:   Diet Order            Diet NPO time specified  Diet effective midnight              EDUCATION NEEDS:   Not appropriate for education at this time  Skin:  Skin Assessment: Reviewed RN Assessment  Last BM:  PTA/unknown  Height:   Ht Readings from Last 1 Encounters:  06/01/18 5' 7.5" (1.715 m)    Weight:   Wt Readings from Last 1 Encounters:  06/01/18 82.2 kg    Ideal Body Weight:  62.5 kg  BMI:  Body mass index is 27.96 kg/m.  Estimated Nutritional Needs:   Kcal:  2055-2300 kcal  Protein:  100-115 grams  Fluid:  >/= 1.5 L/day     Jarome Matin, MS, RD, LDN, [facility] Inpatient Clinical Dietitian Pager # [phone] After hours/weekend pager # [phone]

## 2018-06-02 NOTE — Progress Notes (Signed)
PT Cancellation Note  Patient Details Name: ABBYGAYLE HELFAND MRN: [id number] DOB: [birth year]   Cancelled Treatment:    Reason Eval/Treat Not Completed: Patient at procedure or test/unavailable(pt is out of room for test/procedure. Will follow. )   Philomena Doheny PT 06/02/2018  Acute Rehabilitation Services Pager [phone] Office [phone]

## 2018-06-02 NOTE — Progress Notes (Signed)
Patient back from IR.  Stable at this time.  PleurX site CDI.  Will continue to monitor patient.

## 2018-06-02 NOTE — TOC Transition Note (Signed)
Transition of Care (TOC) - CM/SW Discharge Note   Patient Details  Name: Deborah Mcintyre MRN: [id number] Date of Birth: 04/07/1955  Transition of Care (TOC) CM/SW Contact:  Mahabir, Kathy, RN Phone Number: 06/02/2018, 3:11 PM   Clinical Narrative: Spoke to patient about d/c plans-she declines Personal Care Services-she states she has her husband James @ home-c#[phone],she also has dtr & son asst when needed. She chose [facility] for HHC services-rep Cory aware of d/c in am. HHRN/HHPT ordered. Faxed w/confirmation [facility] pleurx cath drainage kit form. Nsg has ordered Pleurx cath cannisters to rm prior d/c. Patient states she will have family to pick her up from hospital @ d/c. MD updated. No further CM needs.      Final next level of care: Home w Home Health Services Barriers to Discharge: No Barriers Identified   Patient Goals and CMS Choice   CMS Medicare.gov Compare Post Acute Care list provided to:: Patient Choice offered to / list presented to : Patient  Discharge Placement                       Discharge Plan and Services   Discharge Planning Services: CM Consult                      HH Arranged: RN, PT HH Agency: [facility] Date HH Agency Contacted: 06/02/18 Time HH Agency Contacted: 1510 Representative spoke with at HH Agency: Cory  Social Determinants of Health (SDOH) Interventions     Readmission Risk Interventions No flowsheet data found.     

## 2018-06-02 NOTE — TOC Initial Note (Signed)
Transition of Care [facility]) - Initial/Assessment Note    Patient Details  Name: Deborah Mcintyre MRN: [id number] Date of Birth: Oct 14, 1955  Transition of Care [facility]) CM/SW Contact:    Dessa Phi, RN Phone Number: 06/02/2018, 1:42 PM  Clinical Narrative: From home. Patient currently off the floor-will await return to discuss d/c plans-CM is working on [employer] if agreed by patient. Also noted may need [place] agency if home w/pleurx cath for management. Spoke to [place] for dr. Lavella Lemons of [employer] form-will fax to their office once completed by our attending-fax#[phone] to continue w/process. Noted [place] csw prior note.Continue to monitor.               Expected Discharge Plan: [place] Barriers to Discharge: No Barriers Identified   Patient Goals and CMS Choice   CMS Medicare.gov Compare Post Acute Care list provided to:: Patient Choice offered to / list presented to : Patient  Expected Discharge Plan and Services Expected Discharge Plan: [place]   Discharge Planning Services: CM Consult   Living arrangements for the past 2 months: Apartment                                      Prior Living Arrangements/Services Living arrangements for the past 2 months: Apartment Lives with:: Self Patient language and need for interpreter reviewed:: Yes Do you feel safe going back to the place where you live?: Yes      Need for Family Participation in Patient Care: No (Comment) Care giver support system in place?: Yes (comment)   Criminal Activity/Legal Involvement Pertinent to Current Situation/Hospitalization: No - Comment as needed  Activities of Daily Living Home Assistive Devices/Equipment: None ADL Screening (condition at time of admission) Patient's cognitive ability adequate to safely complete daily activities?: Yes Is the patient deaf or have difficulty hearing?: No Does the  patient have difficulty seeing, even when wearing glasses/contacts?: No Does the patient have difficulty concentrating, remembering, or making decisions?: No Patient able to express need for assistance with ADLs?: Yes Does the patient have difficulty dressing or bathing?: No Independently performs ADLs?: Yes (appropriate for developmental age) Does the patient have difficulty walking or climbing stairs?: Yes Weakness of Legs: None Weakness of Arms/Hands: None  Permission Sought/Granted Permission sought to share information with : Case Manager Permission granted to share information with : Yes, Verbal Permission Granted              Emotional Assessment Appearance:: Appears stated age Attitude/Demeanor/Rapport: Gracious Affect (typically observed): Accepting Orientation: : Oriented to Self, Oriented to Place, Oriented to  Time, Oriented to Situation      Admission diagnosis:  Malignant pleural effusion [J91.0] Patient Active Problem List   Diagnosis Date Noted  . Tobacco abuse 06/01/2018  . Prolonged QT interval 06/01/2018  . Chest pain 06/01/2018  . Elevated troponin 06/01/2018  . Cigarette smoker 05/27/2018  . Goals of care, counseling/discussion 05/19/2018  . 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  . Hypertension 05/12/2018  . COPD (chronic obstructive pulmonary disease) ([place]) 05/12/2018  . Anxiety and depression 05/12/2018  . Community acquired pneumonia of left lung 02/04/2017  . Antineoplastic chemotherapy induced pancytopenia ([place]) 04/26/2015  . Antineoplastic chemotherapy induced anemia 04/05/2015  . Breast cancer of upper-outer quadrant of left female breast ([place]) 01/01/2015  . Acute exacerbation of chronic bronchitis ([place]) 08/03/2013  PCP:  [name], MD Pharmacy:   [facility] [place], [address] [address] Phone: [phone] Fax:  [phone]     Social Determinants of Health (SDOH) Interventions    Readmission Risk Interventions No flowsheet data found.

## 2018-06-02 NOTE — Progress Notes (Addendum)
Referring Physician(s): Doutova,A/Gudena,V  Supervising Physician: Sandi Mariscal  Patient Status:  [facility] - In-pt  Chief Complaint:  Breast cancer, dyspnea, chest pain, recurrent malignant left pleural effusion  Subjective: Patient familiar to IR service from prior Port-A-Cath in 2017, removal in 2018 followed by replacement for recurrent disease on 05/25/2018.  She has also undergone left thoracenteses on 05/12/2018, 05/28/2018 and 05/29/2018 for recurrent symptomatic malignant left pleural effusion.  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  She was admitted to [facility] on 5/27 with chest pain and worsening dyspnea.  CT angiogram of the chest revealed:  1. Motion degraded scan. No evidence of pulmonary embolism. 2. Moderate to large left pleural effusion. Dense masslike consolidation replacing the left upper lobe, not appreciably changed in size, likely representing confluent metastatic disease. Worsening aeration in the left lung. 3. Interval growth of two scattered right pulmonary nodules, suspicious for pulmonary metastases. 4. Mediastinal and left hilar adenopathy is stable to mildly increased, suspicious for metastatic nodal disease. 5. Stable ectatic 4.0 cm ascending thoracic aorta. Recommend annual imaging followup by CTA or MRA. This recommendation follows 2010 ACCF/AHA/AATS/ACR/ASA/SCA/SCAI/SIR/STS/SVM Guidelines for the Diagnosis and Management of Patients with Thoracic Aortic Disease. 2010; 121: Z610-R604. 6. Stable dilated main pulmonary artery, suggesting chronic pulmonary arterial hypertension  Request now received for left Pleurx catheter placement.  She is COVID-19 negative.     Past Medical History:  Diagnosis Date   Anxiety    Arthritis    Bronchitis    COPD (chronic obstructive pulmonary disease) (HCC)    Depression    Hypertension    Malignant neoplasm of  upper-outer quadrant of left female breast ([place]) 01/04/2015   Nocturia    Numbness and tingling    toes - bilateral   Pneumonia    PONV (postoperative nausea and vomiting)    Nausea   Past Surgical History:  Procedure Laterality Date   BREAST LUMPECTOMY Bilateral    x3   BREAST LUMPECTOMY WITH RADIOACTIVE SEED LOCALIZATION Right 08/23/2015   Procedure: RIGHT BREAST LUMPECTOMY WITH RADIOACTIVE SEED LOCALIZATION;  Surgeon: Alphonsa Overall, MD;  Location: [place];  Service: General;  Laterality: Right;   CESAREAN SECTION     x4   IR IMAGING GUIDED PORT INSERTION  05/25/2018   IR REMOVAL TUN ACCESS W/ PORT W/O FL MOD SED  10/20/2016   IR THORACENTESIS ASP PLEURAL SPACE W/IMG GUIDE  05/12/2018   MASTECTOMY Left 08/23/2015    RIGHT BREAST LUMPECTOMY WITH RADIOACTIVE SEED LOCALIZATION,  LEFT MASTECTOMY WITH AXILLARY LYMPH NODE DISSECTION   MASTECTOMY WITH AXILLARY LYMPH NODE DISSECTION Left 08/23/2015   Procedure: LEFT MASTECTOMY WITH AXILLARY LYMPH NODE DISSECTION;  Surgeon: Alphonsa Overall, MD;  Location: [place];  Service: General;  Laterality: Left;   MULTIPLE TOOTH EXTRACTIONS        Allergies: Patient has no known allergies.  Medications: Prior to Admission medications   Medication Sig Start Date End Date Taking? Authorizing Provider  albuterol (PROVENTIL HFA;VENTOLIN HFA) 108 (90 Base) MCG/ACT inhaler Inhale 1-2 puffs into the lungs every 6 (six) hours as needed for wheezing or shortness of breath.   Yes [provider]  albuterol (PROVENTIL) (2.5 MG/3ML) 0.083% nebulizer solution Take 3 mLs (2.5 mg total) by nebulization every 6 (six) hours as needed for wheezing or shortness of breath. 05/15/18  Yes Regalado, Belkys A, MD  ALPRAZolam (XANAX) 0.5 MG tablet Take 1 tablet (0.5 mg total)  by mouth 3 (three) times daily as needed for anxiety or sleep. 05/15/18  Yes Regalado, Belkys A, MD  amLODipine (NORVASC) 5 MG tablet Take 1 tablet (5 mg total) by mouth daily. 05/16/18  Yes  Regalado, Belkys A, MD  aspirin EC 81 MG tablet Take 81 mg by mouth daily.   Yes [provider]  gabapentin (NEURONTIN) 300 MG capsule Take 1 capsule (300 mg total) by mouth 3 (three) times daily. 09/23/17  Yes Nicholas Lose, MD  ibuprofen (ADVIL) 200 MG tablet Take 800 mg by mouth every 6 (six) hours as needed for moderate pain.   Yes [provider]  lidocaine-prilocaine (EMLA) cream Apply to affected area once Patient taking differently: Apply 1 application topically daily as needed. Port 05/19/18  Yes Nicholas Lose, MD  metoprolol (LOPRESSOR) 50 MG tablet Take 0.5 tablets (25 mg total) by mouth 2 (two) times daily. 08/07/13  Yes [name], MD  ondansetron (ZOFRAN) 8 MG tablet Take 1 tablet (8 mg total) by mouth every 8 (eight) hours as needed for nausea. 09/23/17  Yes Nicholas Lose, MD  oxyCODONE-acetaminophen (PERCOCET/ROXICET) 5-325 MG tablet Take 1 tablet by mouth every 8 (eight) hours as needed for moderate pain or severe pain (1 tab every 4-6 hour prn pain). Patient taking differently: Take 1 tablet by mouth every 6 (six) hours as needed for moderate pain or severe pain.  05/19/18  Yes Nicholas Lose, MD  PARoxetine (PAXIL) 20 MG tablet Take 1 tablet (20 mg total) by mouth 2 (two) times daily. 05/15/18  Yes Regalado, Belkys A, MD  PREMARIN vaginal cream INSERT 1 APPLICATORFUL VAGINALLY DAILY Patient taking differently: Place 1 Applicatorful vaginally daily.  04/19/17  Yes Nicholas Lose, MD  prochlorperazine (COMPAZINE) 10 MG tablet Take 1 tablet (10 mg total) by mouth every 6 (six) hours as needed (Nausea or vomiting). 05/19/18  Yes Nicholas Lose, MD  ferrous sulfate 325 (65 FE) MG tablet Take 1 tablet (325 mg total) by mouth daily with breakfast. Patient not taking: Reported on 06/01/2018 02/10/17   [name], MD  lidocaine (LIDODERM) 5 % Place 1 patch onto the skin daily. Remove & Discard patch within 12 hours or as directed by MD Patient not taking: Reported on 06/01/2018  05/29/18   Shelly Coss, MD     Vital Signs: BP 113/86    Pulse 77    Temp 98.3 F (36.8 C) (Oral)    Resp 20    Ht 5' 7.5" (1.715 m)    Wt 181 lb 3.5 oz (82.2 kg)    SpO2 92%    BMI 27.96 kg/m   Physical Exam awake, alert.  Chest with distant breath sounds bilaterally and diminished from left mid to lower lung zone; heart with regular rate and rhythm.  Abdomen soft, positive bowel sounds, nontender.  Imaging: Ct Angio Chest Pe W And/or Wo Contrast  Result Date: 06/01/2018 CLINICAL DATA:  Dyspnea, multiple recent left thoracentesis procedures, history of left breast cancer. Cytology from 05/12/2018 left thoracentesis demonstrated adenocarcinoma. EXAM: CT ANGIOGRAPHY CHEST WITH CONTRAST TECHNIQUE: Multidetector CT imaging of the chest was performed using the standard protocol during bolus administration of intravenous contrast. Multiplanar CT image reconstructions and MIPs were obtained to evaluate the vascular anatomy. CONTRAST:  168mL OMNIPAQUE IOHEXOL 350 MG/ML SOLN COMPARISON:  Chest radiograph from earlier today. 05/12/2018 chest CT. FINDINGS: Cardiovascular: The study is moderate quality for the evaluation of pulmonary embolism, with some motion degradation. There are no filling defects in the central, lobar, segmental  or subsegmental pulmonary artery branches to suggest acute pulmonary embolism. Atherosclerotic thoracic aorta with stable ectatic 4.0 cm ascending thoracic aorta. Stable dilated main pulmonary artery (3.9 cm diameter). Normal heart size. No significant pericardial fluid/thickening. Right internal jugular Port-A-Cath terminates at the cavoatrial junction. Mediastinum/Nodes: No discrete thyroid nodules. Unremarkable esophagus. No axillary adenopathy. Left axillary surgical clips again noted. Enlarged 1.4 cm left prevascular node (series 11/image 34), stable. Multiple enlarged right paratracheal nodes up to 2.1 cm (series 11/image 32), mildly increased from 1.9 cm using similar  measurement technique. Enlarged 2.1 cm subcarinal node (series 11/image 42), increased from 1.8 cm. Enlarged 1.8 cm left hilar node (series 11/image 36), increased from 1.4 cm. No right hilar adenopathy. Lungs/Pleura: No pneumothorax. Moderate to large left pleural effusion, predominantly basilar. No right pleural effusion. Dense masslike consolidation replacing the left upper lobe measuring up to 12.0 x 5.8 cm (series 11/image 37), not appreciably changed in size, with loss of previously seen apical left upper lobe aeration. Similar moderate to severe compressive atelectasis in the left lower lobe. Basilar right upper lobe 0.8 cm solid pulmonary nodule (series 10/image 69), increased from 0.3 cm. Peripheral right lower lobe 1.3 cm subsolid nodule (series 10/image 60), increased from 1.1 cm and increased in density. Upper abdomen: No acute abnormality. Musculoskeletal: No aggressive appearing focal osseous lesions. Left mastectomy. Superficial subcutaneous round cystic 1.6 cm lesion in midline back (series 11/image 40), unchanged, presumably a sebaceous cyst. Mild thoracic spondylosis. Review of the MIP images confirms the above findings. IMPRESSION: 1. Motion degraded scan. No evidence of pulmonary embolism. 2. Moderate to large left pleural effusion. Dense masslike consolidation replacing the left upper lobe, not appreciably changed in size, likely representing confluent metastatic disease. Worsening aeration in the left lung. 3. Interval growth of two scattered right pulmonary nodules, suspicious for pulmonary metastases. 4. Mediastinal and left hilar adenopathy is stable to mildly increased, suspicious for metastatic nodal disease. 5. Stable ectatic 4.0 cm ascending thoracic aorta. Recommend annual imaging followup by CTA or MRA. This recommendation follows 2010 ACCF/AHA/AATS/ACR/ASA/SCA/SCAI/SIR/STS/SVM Guidelines for the Diagnosis and Management of Patients with Thoracic Aortic Disease. 2010; 121: C376-E831.  6. Stable dilated main pulmonary artery, suggesting chronic pulmonary arterial hypertension. Aortic Atherosclerosis (ICD10-I70.0). Electronically Signed   By: Ilona Sorrel M.D.   On: 06/01/2018 20:23   Dg Chest Port 1 View  Result Date: 06/01/2018 CLINICAL DATA:  Chest pain EXAM: PORTABLE CHEST 1 VIEW COMPARISON:  05/29/2018 FINDINGS: The right-sided Port-A-Cath well position. There is near complete opacification of the left hemithorax, progressed from prior study. The right lung field is essentially clear. There is no acute osseous abnormality. The cardiac silhouette is poorly evaluated on this examination. IMPRESSION: Near complete opacification of the left hemithorax, worsened since prior study. Findings are suspicious for a growing left-sided pleural effusion with associated compressive atelectasis. Electronically Signed   By: Constance Holster M.D.   On: 06/01/2018 17:20   Dg Chest Port 1 View  Result Date: 05/29/2018 CLINICAL DATA:  Status post thoracentesis EXAM: PORTABLE CHEST 1 VIEW COMPARISON:  Chest radiograph May 28, 2018. FINDINGS: No pneumothorax. There remains moderate pleural effusion on the left. There is opacification of much of the remaining left lung, likely due to a combination of airspace consolidation and a degree of re-expansion pulmonary edema. The right lung is clear. Heart is mildly enlarged with visualized pulmonary vascularity unremarkable. Port-A-Cath tip is in the superior vena cava. No bone lesions. Surgical clips are noted in the left axilla. IMPRESSION: No pneumothorax.  There remains residual effusion on the left, much less than prior to thoracentesis. Opacification throughout much of the remaining left lung is likely due to a combination of underlying airspace opacity and re-expansion pulmonary edema. Right lung clear. Stable cardiac silhouette. Electronically Signed   By: Lowella Grip III M.D.   On: 05/29/2018 11:52   US Thoracentesis Asp Pleural Space W/img  Guide  Result Date: 05/29/2018 INDICATION: 63 year old female with recurrent left pleural effusion, malignant EXAM: ULTRASOUND GUIDED LEFT THORACENTESIS MEDICATIONS: None COMPLICATIONS: None PROCEDURE: An ultrasound guided thoracentesis was thoroughly discussed with the patient and questions answered. The benefits, risks, alternatives and complications were also discussed. The patient understands and wishes to proceed with the procedure. Written consent was obtained. Ultrasound was performed to localize and mark an adequate pocket of fluid in the left chest. The area was then prepped and draped in the normal sterile fashion. 1% Lidocaine was used for local anesthesia. Under ultrasound guidance a 8 Fr Safe-T-Centesis catheter was introduced. Thoracentesis was performed. The patient did not want to proceed once she started coughing, at a volume of 1.6 L. The catheter was removed and a dressing applied. FINDINGS: A total of approximately 1.6 L of amber fluid was removed. IMPRESSION: Status post ultrasound-guided left thoracentesis with 1.6 L fluid removed. The patient does not wish to proceed to full drainage given her symptoms of coughing and pain. Residual fluid at completion. Signed, Dulcy Fanny. Dellia Nims, RPVI [facility] Electronically Signed   By: Corrie Mckusick D.O.   On: 05/29/2018 12:21    Labs:  CBC: Recent Labs    05/27/18 1653 05/28/18 0601 06/01/18 1714 06/02/18 0110  WBC 8.6 5.2 5.3 5.7  HGB 13.3 11.4* 12.9 13.0  HCT 42.3 37.2 40.5 41.1  PLT 280 290 275 287    COAGS: Recent Labs    05/25/18 1023 06/01/18 1714  INR 1.0 1.0    BMP: Recent Labs    05/27/18 1653 05/28/18 0601 06/01/18 1714 06/02/18 0110  NA 142 134* 140 139  K 3.5 3.8 3.6 3.7  CL 102 98 100 102  CO2 29 28 32 31  GLUCOSE 105* 487* 88 92  BUN 10 12 7* 7*  CALCIUM 9.2 8.1* 8.8* 8.7*  CREATININE 0.59 0.69 0.63 0.66  GFRNONAA >60 >60 >60 >60  GFRAA  >60 >60 >60 >60    LIVER FUNCTION TESTS: Recent Labs    05/12/18 0353 05/27/18 1653 06/01/18 1714 06/02/18 0110  BILITOT 0.6 0.2* 0.3 0.5  AST 13* 10* 10* 11*  ALT 9 9 10 8   ALKPHOS 57 58 49 50  PROT 6.9 7.1 6.1* 6.1*  ALBUMIN 3.7 3.5 3.0* 3.1*    Assessment and Plan: Patient with history of tobacco abuse, COPD, hypertension, 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  Prior imaging studies have been reviewed by Dr. Pascal Lux.  Details/risks of procedure, including but not limited to, internal bleeding, infection, injury to adjacent structures/pneumothorax, inability to adequately drain fluid discussed with patient with her understanding and consent.  Procedure scheduled for today.  Patient is COVID-19 negative.   Electronically Signed: D. Rowe Robert, PA-C 06/02/2018, 10:16 AM   I spent a total of 30 minutes at the the patient's bedside AND on the patient's hospital floor or unit, greater than 50% of which was counseling/coordinating care for left Pleurx  catheter placement    Patient ID: Deborah Mcintyre, female   DOB: 02/03/1955, 63 y.o.   MRN: [id number]

## 2018-06-02 NOTE — Evaluation (Signed)
Occupational Therapy Evaluation Patient Details Name: Deborah Mcintyre MRN: [id number] DOB: 1955/12/16 Today's Date: 06/02/2018    History of Present Illness 63 y.o. female with medical history significant of hypertension, COPD on 2 L of oxygen at baseline, smoker, breast cancer, tobacco abuse, left-sided malignant pleural effusion   Clinical Impression   Pt admitted with L sided pleural effusion. Pt currently with functional limitations due to the deficits listed below (see OT Problem List).  Pt will benefit from skilled OT to increase their safety and independence with ADL and functional mobility for ADL to facilitate discharge to venue listed below.      Follow Up Recommendations  No OT follow up;Supervision - Intermittent    Equipment Recommendations  None recommended by OT    Recommendations for Other Services       Precautions / Restrictions Precautions Precautions: Fall      Mobility Bed Mobility Overal bed mobility: Needs Assistance Bed Mobility: Supine to Sit     Supine to sit: Min guard        Transfers Overall transfer level: Needs assistance Equipment used: 1 person hand held assist Transfers: Sit to/from [employer] Sit to Stand: Min guard Stand pivot transfers: Min guard            Balance Overall balance assessment: No apparent balance deficits (not formally assessed)                                         ADL either performed or assessed with clinical judgement   ADL Overall ADL's : Needs assistance/impaired Eating/Feeding: Set up;Sitting   Grooming: Set up;Sitting   Upper Body Bathing: Set up;Sitting   Lower Body Bathing: Minimal assistance;Sit to/from stand;Cueing for sequencing;Cueing for safety   Upper Body Dressing : Set up;Sitting   Lower Body Dressing: Minimal assistance;Sit to/from stand;Cueing for sequencing;Cueing for safety   Toilet Transfer: Min guard;Cueing for safety;Cueing for  sequencing   Toileting- Water quality scientist and Hygiene: Min guard;Sit to/from stand;Cueing for sequencing;Cueing for safety       Functional mobility during ADLs: Min guard;Cueing for safety;Cueing for sequencing       Vision Baseline Vision/History: Wears glasses Wears Glasses: Reading only Patient Visual Report: No change from baseline              Pertinent Vitals/Pain Pain Assessment: 0-10 Pain Location: L chest area Pain Descriptors / Indicators: Sore Pain Intervention(s): Limited activity within patient's tolerance;Repositioned     Hand Dominance     Extremity/Trunk Assessment Upper Extremity Assessment Upper Extremity Assessment: Generalized weakness           Communication Communication Communication: No difficulties   Cognition Arousal/Alertness: Awake/alert Behavior During Therapy: WFL for tasks assessed/performed Overall Cognitive Status: Within Functional Limits for tasks assessed                                                Home Living Family/patient expects to be discharged to:: Private residence Living Arrangements: Children Available Help at Discharge: Family Type of Home: House Home Access: Stairs to enter Technical brewer of Steps: 3   Home Layout: One level     Bathroom Shower/Tub: Walk-in shower;Tub/shower unit   Bathroom Toilet: Standard     Home Equipment: None  Prior Functioning/Environment Level of Independence: Independent                 OT Problem List: Decreased strength;Decreased activity tolerance      OT Treatment/Interventions: Self-care/ADL training;Patient/family education;Therapeutic activities;DME and/or AE instruction    OT Goals(Current goals can be found in the care plan section) Acute Rehab OT Goals Patient Stated Goal: home and feel better OT Goal Formulation: With patient Time For Goal Achievement: 06/16/18  OT Frequency: Min 2X/week    AM-PAC OT "6  Clicks" Daily Activity     Outcome Measure Help from another person eating meals?: None Help from another person taking care of personal grooming?: None Help from another person toileting, which includes using toliet, bedpan, or urinal?: A Little Help from another person bathing (including washing, rinsing, drying)?: A Little Help from another person to put on and taking off regular upper body clothing?: None Help from another person to put on and taking off regular lower body clothing?: A Little 6 Click Score: 21   End of Session Equipment Utilized During Treatment: Gait belt Nurse Communication: Mobility status  Activity Tolerance: Patient tolerated treatment well Patient left: in chair;with call bell/phone within reach  OT Visit Diagnosis: Unsteadiness on feet (R26.81);Other abnormalities of gait and mobility (R26.89);Muscle weakness (generalized) (M62.81)                Time: 1130-1200 OT Time Calculation (min): 30 min Charges:  OT General Charges $OT Visit: 1 Visit OT Evaluation $OT Eval Moderate Complexity: 1 Mod  Kari Baars, [place] [phone] Office- [phone]     Dudley, Edwena Felty D 06/02/2018, 1:47 PM

## 2018-06-02 NOTE — Care Management Obs Status (Signed)
[place] NOTIFICATION   Patient Details  Name: Deborah Mcintyre MRN: [id number] Date of Birth: [birth year]   Medicare Observation Status Notification Given:  Yes    MahabirJuliann Pulse, RN 06/02/2018, 1:41 PM

## 2018-06-02 NOTE — Progress Notes (Signed)
Recurrent Malignant Pleural Effusion: S/P Pleurx catheter placement. Patient's chemo will be postponed to next week. Will have our schedulers call her with a date and time for chemo. Likely discharge tomorrow

## 2018-06-02 NOTE — Progress Notes (Signed)
RN spoke with in patient case manager Dessa Phi, RN who stated she was going to start the process of referring pt for [facility].  Case manager unsure of how long pt will stay in patient, but stated she will fax all documentation to Dr. Geralyn Flash office [phone]).

## 2018-06-03 ENCOUNTER — Inpatient Hospital Stay: Payer: Medicare Other

## 2018-06-03 ENCOUNTER — Other Ambulatory Visit (HOSPITAL_COMMUNITY): Payer: Medicare Other

## 2018-06-03 ENCOUNTER — Inpatient Hospital Stay (HOSPITAL_COMMUNITY): Payer: Medicare Other

## 2018-06-03 ENCOUNTER — Inpatient Hospital Stay: Payer: Medicare Other | Admitting: Hematology and Oncology

## 2018-06-03 DIAGNOSIS — Z938 Other artificial opening status: Secondary | ICD-10-CM

## 2018-06-03 MED ORDER — HEPARIN SOD (PORK) LOCK FLUSH 100 UNIT/ML IV SOLN
500.0000 [IU] | INTRAVENOUS | Status: AC | PRN
  Administered 2018-06-03: 500 [IU]

## 2018-06-03 NOTE — Progress Notes (Signed)
Charge RN, Baxter Flattery, and I instructed patient on how to drain Pleurx.  Patient taught back to demonstrate her understanding.

## 2018-06-03 NOTE — Progress Notes (Signed)
OT Cancellation Note  Patient Details Name: Deborah Mcintyre MRN: [id number] DOB: [birth year]   Cancelled Treatment:    Reason Eval/Treat Not Completed: Patient declined, no reason specified Met with pt, anticipating d/c this date. Offered services as far as energy conservation, ADL practice, UE exercise, pt deferring all at this time. Will follow as needs arise, but anticipate d/c this date.   Zenovia Jarred, MSOT, OTR/L Behavioral Health OT/ Acute Relief OT [facility] Office: [place] 06/03/2018, 10:27 AM

## 2018-06-03 NOTE — TOC Transition Note (Signed)
Transition of Care [facility]) - CM/SW Discharge Note   Patient Details  Name: Deborah Mcintyre MRN: [id number] Date of Birth: [birth year]  Transition of Care [facility]) CM/SW Contact:  Dessa Phi, RN Phone Number: 06/03/2018, 9:54 AM   Clinical Narrative: Patient has pleurx cannisters in rm for home. [facility] from has already been faxed for ongoing pleurx drainage cannisters if needed. [facility] rep aware of d/c HHRN/PT ordered. Patient will need home 02 travel tank delivered to rm prior d/c-rep [facility] aware.Patient states her husband will transport home.     Final next level of care: Home w Home Health Services Barriers to Discharge: No Barriers Identified   Patient Goals and CMS Choice   CMS Medicare.gov Compare Post Acute Care list provided to:: Patient Choice offered to / list presented to : Patient  Discharge Placement                       Discharge Plan and Services   Discharge Planning Services: CM Consult                      HH Arranged: RN, PT [facility] Agency: [place] Date [facility] Agency Contacted: 06/02/18 Time [place]: 2800 Representative spoke with at [place]: [place] Interventions     Readmission Risk Interventions No flowsheet data found.

## 2018-06-03 NOTE — Progress Notes (Signed)
Referring Physician(s): Doutova,A/Gudena,V  Supervising Physician: Sandi Mariscal  Patient Status:  [facility] - In-pt  Chief Complaint:  Breast cancer, dyspnea, chest pain, recurrent malignant left pleural effusion  Subjective: Patient doing okay today.  Awaiting discharge home.   Allergies: Patient has no known allergies.  Medications: Prior to Admission medications   Medication Sig Start Date End Date Taking? Authorizing Provider  albuterol (PROVENTIL HFA;VENTOLIN HFA) 108 (90 Base) MCG/ACT inhaler Inhale 1-2 puffs into the lungs every 6 (six) hours as needed for wheezing or shortness of breath.   Yes [provider]  albuterol (PROVENTIL) (2.5 MG/3ML) 0.083% nebulizer solution Take 3 mLs (2.5 mg total) by nebulization every 6 (six) hours as needed for wheezing or shortness of breath. 05/15/18  Yes Regalado, Belkys A, MD  ALPRAZolam (XANAX) 0.5 MG tablet Take 1 tablet (0.5 mg total) by mouth 3 (three) times daily as needed for anxiety or sleep. 05/15/18  Yes Regalado, Belkys A, MD  amLODipine (NORVASC) 5 MG tablet Take 1 tablet (5 mg total) by mouth daily. 05/16/18  Yes Regalado, Belkys A, MD  aspirin EC 81 MG tablet Take 81 mg by mouth daily.   Yes [provider]  gabapentin (NEURONTIN) 300 MG capsule Take 1 capsule (300 mg total) by mouth 3 (three) times daily. 09/23/17  Yes Nicholas Lose, MD  ibuprofen (ADVIL) 200 MG tablet Take 800 mg by mouth every 6 (six) hours as needed for moderate pain.   Yes [provider]  lidocaine-prilocaine (EMLA) cream Apply to affected area once Patient taking differently: Apply 1 application topically daily as needed. Port 05/19/18  Yes Nicholas Lose, MD  metoprolol (LOPRESSOR) 50 MG tablet Take 0.5 tablets (25 mg total) by mouth 2 (two) times daily. 08/07/13  Yes [name], MD  ondansetron (ZOFRAN) 8 MG tablet Take 1 tablet (8 mg total) by mouth every 8 (eight) hours as needed for nausea. 09/23/17  Yes Nicholas Lose, MD    oxyCODONE-acetaminophen (PERCOCET/ROXICET) 5-325 MG tablet Take 1 tablet by mouth every 8 (eight) hours as needed for moderate pain or severe pain (1 tab every 4-6 hour prn pain). Patient taking differently: Take 1 tablet by mouth every 6 (six) hours as needed for moderate pain or severe pain.  05/19/18  Yes Nicholas Lose, MD  PARoxetine (PAXIL) 20 MG tablet Take 1 tablet (20 mg total) by mouth 2 (two) times daily. 05/15/18  Yes Regalado, Belkys A, MD  PREMARIN vaginal cream INSERT 1 APPLICATORFUL VAGINALLY DAILY Patient taking differently: Place 1 Applicatorful vaginally daily.  04/19/17  Yes Nicholas Lose, MD  prochlorperazine (COMPAZINE) 10 MG tablet Take 1 tablet (10 mg total) by mouth every 6 (six) hours as needed (Nausea or vomiting). 05/19/18  Yes Nicholas Lose, MD  ferrous sulfate 325 (65 FE) MG tablet Take 1 tablet (325 mg total) by mouth daily with breakfast. Patient not taking: Reported on 06/01/2018 02/10/17   [name], MD  lidocaine (LIDODERM) 5 % Place 1 patch onto the skin daily. Remove & Discard patch within 12 hours or as directed by MD Patient not taking: Reported on 06/01/2018 05/29/18   Shelly Coss, MD  metoprolol tartrate (LOPRESSOR) 25 MG tablet Take 1 tablet (25 mg total) by mouth 2 (two) times daily. 06/02/18   Georgette Shell, MD     Vital Signs: BP (!) 140/97 (BP Location: Right Arm)    Pulse 69    Temp 98 F (36.7 C) (Oral)    Resp 18    Ht  5' 7.5" (1.715 m)    Wt 181 lb 3.5 oz (82.2 kg)    SpO2 91%    BMI 27.96 kg/m   Physical Exam left anterior-lat  Pleurx catheter intact, insertion site okay, no leaking; site mildly tender  Imaging: Ct Angio Chest Pe W And/or Wo Contrast  Result Date: 06/01/2018 CLINICAL DATA:  Dyspnea, multiple recent left thoracentesis procedures, history of left breast cancer. Cytology from 05/12/2018 left thoracentesis demonstrated adenocarcinoma. EXAM: CT ANGIOGRAPHY CHEST WITH CONTRAST TECHNIQUE: Multidetector CT imaging of the chest  was performed using the standard protocol during bolus administration of intravenous contrast. Multiplanar CT image reconstructions and MIPs were obtained to evaluate the vascular anatomy. CONTRAST:  126m OMNIPAQUE IOHEXOL 350 MG/ML SOLN COMPARISON:  Chest radiograph from earlier today. 05/12/2018 chest CT. FINDINGS: Cardiovascular: The study is moderate quality for the evaluation of pulmonary embolism, with some motion degradation. There are no filling defects in the central, lobar, segmental or subsegmental pulmonary artery branches to suggest acute pulmonary embolism. Atherosclerotic thoracic aorta with stable ectatic 4.0 cm ascending thoracic aorta. Stable dilated main pulmonary artery (3.9 cm diameter). Normal heart size. No significant pericardial fluid/thickening. Right internal jugular Port-A-Cath terminates at the cavoatrial junction. Mediastinum/Nodes: No discrete thyroid nodules. Unremarkable esophagus. No axillary adenopathy. Left axillary surgical clips again noted. Enlarged 1.4 cm left prevascular node (series 11/image 34), stable. Multiple enlarged right paratracheal nodes up to 2.1 cm (series 11/image 32), mildly increased from 1.9 cm using similar measurement technique. Enlarged 2.1 cm subcarinal node (series 11/image 42), increased from 1.8 cm. Enlarged 1.8 cm left hilar node (series 11/image 36), increased from 1.4 cm. No right hilar adenopathy. Lungs/Pleura: No pneumothorax. Moderate to large left pleural effusion, predominantly basilar. No right pleural effusion. Dense masslike consolidation replacing the left upper lobe measuring up to 12.0 x 5.8 cm (series 11/image 37), not appreciably changed in size, with loss of previously seen apical left upper lobe aeration. Similar moderate to severe compressive atelectasis in the left lower lobe. Basilar right upper lobe 0.8 cm solid pulmonary nodule (series 10/image 69), increased from 0.3 cm. Peripheral right lower lobe 1.3 cm subsolid nodule (series  10/image 60), increased from 1.1 cm and increased in density. Upper abdomen: No acute abnormality. Musculoskeletal: No aggressive appearing focal osseous lesions. Left mastectomy. Superficial subcutaneous round cystic 1.6 cm lesion in midline back (series 11/image 40), unchanged, presumably a sebaceous cyst. Mild thoracic spondylosis. Review of the MIP images confirms the above findings. IMPRESSION: 1. Motion degraded scan. No evidence of pulmonary embolism. 2. Moderate to large left pleural effusion. Dense masslike consolidation replacing the left upper lobe, not appreciably changed in size, likely representing confluent metastatic disease. Worsening aeration in the left lung. 3. Interval growth of two scattered right pulmonary nodules, suspicious for pulmonary metastases. 4. Mediastinal and left hilar adenopathy is stable to mildly increased, suspicious for metastatic nodal disease. 5. Stable ectatic 4.0 cm ascending thoracic aorta. Recommend annual imaging followup by CTA or MRA. This recommendation follows 2010 ACCF/AHA/AATS/ACR/ASA/SCA/SCAI/SIR/STS/SVM Guidelines for the Diagnosis and Management of Patients with Thoracic Aortic Disease. 2010; 121: [id number] 6. Stable dilated main pulmonary artery, suggesting chronic pulmonary arterial hypertension. Aortic Atherosclerosis (ICD10-I70.0). Electronically Signed   By: JIlona SorrelM.D.   On: 06/01/2018 20:23   Ir Guided DNiel HummerW Catheter Placement  Result Date: 06/02/2018 CLINICAL DATA:  History of metastatic breast cancer with recurrent symptomatic left-sided pleural effusions. Please perform image guided tunneled pleural drainage catheter for palliative purposes. EXAM: INSERTION OF TUNNELED LEFT SIDED PLEURAL  DRAINAGE CATHETER COMPARISON:  Chest CT-06/01/2018; chest radiograph-06/01/2018; left-sided thoracentesis - 05/29/2018; 05/28/2018; 05/12/2018 MEDICATIONS: Ancef 2 gm IV; Antibiotic was administered in an appropriate time interval for the procedure.  ANESTHESIA/SEDATION: Moderate (conscious) sedation was employed during this procedure. A total of Versed 1 mg and Fentanyl 50 mcg was administered intravenously. Moderate Sedation Time: 17 minutes. The patient's level of consciousness and vital signs were monitored continuously by radiology nursing throughout the procedure under my direct supervision. FLUOROSCOPY TIME:  FLUOROSCOPY TIME 24 seconds (5.8 mGy) COMPLICATIONS: None immediate. PROCEDURE: The procedure, risks, benefits, and alternatives were explained to the patient, who wish to proceed with the placement of this permanent pleural catheter as the patient is seeking palliative care. The patient understand and consent to the procedure. The left lateral chest and upper abdomen were prepped with Chlorhexidine in a sterile fashion, and a sterile drape was applied covering the operative field. A sterile gown and sterile gloves were used for the procedure. Initial ultrasound scanning and fluoroscopic imaging demonstrates a recurrent moderate to large pleural effusion. Under direct ultrasound guidance, the left inferior lateral pleural space was accessed with a Yueh sheath needle after the overlying soft tissues were anesthetized with 1% lidocaine with epinephrine. An Amplatz super stiff wire was then advanced under fluoroscopy into the pleural space. A 15.5 French tunneled Pleur-X catheter was tunneled from an incision within the right upper abdominal quadrant to the access site. The pleural access site was serially dilated under fluoroscopy, ultimately allowing placement of a peel-away sheath. The catheter was advanced through the peel-away sheath. The sheath was then removed. Final catheter positioning was confirmed with a fluoroscopic radiographic image. The access incision was closed with subcutaneous subcuticular 4-0 Vicryl, Dermabond and Steri-Strips. A Prolene retention suture was applied at the catheter exit site. Large volume thoracentesis was performed  through the new catheter utilizing provided bulb vacuum assisted drainage bag. The patient tolerated the above procedure well without immediate postprocedural complication. FINDINGS: Preprocedural ultrasound scanning demonstrates a recurrent large sized left sided pleural effusion. After ultrasound and fluoroscopic guided placement, the catheter is directed medial aspect the left lung apex Following catheter placement, approximately 1.75 L of serous pleural fluid was removed. IMPRESSION: Successful placement of permanent, tunneled left pleural drainage catheter via lateral approach. 1.75 liters of serous pleural fluid was removed after catheter placement. Electronically Signed   By: Sandi Mariscal M.D.   On: 06/02/2018 14:36   Dg Chest Port 1 View  Result Date: 06/03/2018 CLINICAL DATA:  Chest tube placement. EXAM: PORTABLE CHEST 1 VIEW COMPARISON:  CT 06/01/2018.  Chest x-ray 06/01/2018. FINDINGS: PowerPort catheter with tip over superior vena cava in stable position. Left chest tube noted with tip over the upper medial left chest. Interim partial resolution of left-sided pleural effusion. Underlying left lung atelectasis/infiltrate. No pneumothorax. Heart size stable. Surgical clips left axilla. No acute bony abnormality identified. IMPRESSION: Interim placement of left chest tube with partial resolution of left-sided pleural effusion. Underlying left lung atelectasis/infiltrate noted. No pneumothorax. Electronically Signed   By: Marcello Moores  Register   On: 06/03/2018 06:37   Dg Chest Port 1 View  Result Date: 06/01/2018 CLINICAL DATA:  Chest pain EXAM: PORTABLE CHEST 1 VIEW COMPARISON:  05/29/2018 FINDINGS: The right-sided Port-A-Cath well position. There is near complete opacification of the left hemithorax, progressed from prior study. The right lung field is essentially clear. There is no acute osseous abnormality. The cardiac silhouette is poorly evaluated on this examination. IMPRESSION: Near complete  opacification of the left  hemithorax, worsened since prior study. Findings are suspicious for a growing left-sided pleural effusion with associated compressive atelectasis. Electronically Signed   By: Constance Holster M.D.   On: 06/01/2018 17:20    Labs:  CBC: Recent Labs    05/27/18 1653 05/28/18 0601 06/01/18 1714 06/02/18 0110  WBC 8.6 5.2 5.3 5.7  HGB 13.3 11.4* 12.9 13.0  HCT 42.3 37.2 40.5 41.1  PLT 280 290 275 287    COAGS: Recent Labs    05/25/18 1023 06/01/18 1714  INR 1.0 1.0    BMP: Recent Labs    05/27/18 1653 05/28/18 0601 06/01/18 1714 06/02/18 0110  NA 142 134* 140 139  K 3.5 3.8 3.6 3.7  CL 102 98 100 102  CO2 29 28 32 31  GLUCOSE 105* 487* 88 92  BUN 10 12 7* 7*  CALCIUM 9.2 8.1* 8.8* 8.7*  CREATININE 0.59 0.69 0.63 0.66  GFRNONAA >60 >60 >60 >60  GFRAA >60 >60 >60 >60    LIVER FUNCTION TESTS: Recent Labs    05/12/18 0353 05/27/18 1653 06/01/18 1714 06/02/18 0110  BILITOT 0.6 0.2* 0.3 0.5  AST 13* 10* 10* 11*  ALT '9 9 10 8  ' ALKPHOS 57 58 49 50  PROT 6.9 7.1 6.1* 6.1*  ALBUMIN 3.7 3.5 3.0* 3.1*    Assessment and Plan: Patient with history of met breast cancer and recurrent symptomatic malignant left pleural effusion, status post left Pleurx catheter placement on 5/28 with 1.8 liters fluid removed; afebrile, no new labs, chest x-ray today with partial resolution of left effusion, underlying left lung atelectasis/infiltrate, no pneumothorax.  Drain Pleurx as needed based on pt's symptoms; other plans as per oncology/TRH    Electronically Signed: D. Rowe Robert, PA-C 06/03/2018, 12:40 PM   I spent a total of 15 minutes at the the patient's bedside AND on the patient's hospital floor or unit, greater than 50% of which was counseling/coordinating care for left pleurx catheter    Patient ID: TYMESHIA AWAN, female   DOB: 20-Mar-1955, 63 y.o.   MRN: [id number]

## 2018-06-03 NOTE — Progress Notes (Signed)
PT Cancellation Note / SCREEN  Patient Details Name: Deborah Mcintyre MRN: [id number] DOB: 10/25/1955   Cancelled Treatment:    Reason Eval/Treat Not Completed: PT screened, no needs identified, will sign off Pt reports no PT needs at this time and declines PT.  Pt reports d/c home today.  Will sign off since pt declines PT.   Kameryn Davern,KATHrine E 06/03/2018, 11:09 AM Carmelia Bake, PT, DPT Acute Rehabilitation Services Office: [phone] Pager: [phone]

## 2018-06-03 NOTE — Progress Notes (Signed)
Charge Nurse, Baxter Flattery, and I took patient by wheelchair to main entrance where husband was waiting. Patient was in possession of her discharge packet, which I reviewed with her and she had all of her belongings.  All vital signs were stable.

## 2018-06-03 NOTE — Discharge Summary (Signed)
Physician Discharge Summary  BRANDOLYN SHORTRIDGE MLY:[id number] DOB: Oct 18, 1955 DOA: 06/01/2018  PCP: [name], MD  Admit date: 06/01/2018 Discharge date: 06/03/2018  Admitted From: Home Disposition: Home Recommendations for Outpatient Follow-up:  1. Follow up with PCP in 1-2 weeks 2. Please obtain BMP/CBC in one week 3. Follow-up with Dr. Sonny Dandy  Home Health: None patient refused Equipment/Devices: None Discharge Condition: Stable and improved CODE STATUS full code Diet recommendation: Cardiac  Brief/Interim Summary:63 y.o.femalewith medical history significant of hypertension, COPDon 2 L of oxygen at baseline, smoker, breast cancer, tobacco abuse,left-sided malignant pleural effusion  Presented withchest pain generalized worse with breathing and movement she has given herself nebulizer treatments at home On EMS arrival 9200% on room air blood pressure 162/100 CBG 138 This of breath and chest pain has been gradually getting worse and so arthrocentesis. No associated fevers or chills shortness of breath was fine flat no leg edema nonproductive cough   Admitted for left-sided lumbar fusion underwent ultrasound-guided thoracentesis by IR twiceRemoval of total 2.7 Lplan waswas further follow-up with IR for Pleurx catheter placement done over the weekend was plan was to do it on Tuesday. In the past she has undergone thoracentesis as well and at that timecytology showed adenocarcinoma. 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  Now with recurrent left pleural effusion thought to be malignancy related.  Patient has had multiple hospital admissions for the same.  She had 3 admissions just this month May 2020.  All the 3 time she was admitted with recurrent symptomatic malignant left pleural effusion.  She was just discharged home 05/29/2018.  IR placed tunneled left-sided pleural drain and a large volume left-sided thoracentesis performed following drain placement.  Patient will be discharged home today staff to drain how to take care of the Pleurx catheter.  Patient refused any help at home.  She said her family is able and willing to help her  #2 COPD with ongoing tobacco abuse continue nebulizers  #3 hypertension continue amlodipine blood pressure stable  #4 chronically elevated troponin patient has no symptoms    Nutrition Problem: Increased nutrient needs Etiology: chronic illness, cancer and cancer related treatments    Signs/Symptoms: estimated needs     Interventions: Ensure Enlive (each supplement provides 350kcal and 20 grams of protein), Prostat, MVI  Estimated body mass index is 27.96 kg/m as calculated from the following:   Height as of this encounter: 5' 7.5" (1.715 m).   Weight as of this encounter: 82.2 kg.  Discharge Instructions  Discharge Instructions    Call MD for:  difficulty breathing, headache or visual disturbances   Complete by:  As directed    Call MD for:  persistant dizziness or light-headedness   Complete by:  As directed    Call MD for:  persistant nausea and vomiting   Complete by:  As directed    Call MD for:  severe uncontrolled pain   Complete by:  As directed    Call MD for:  temperature >100.4  Complete by:  As directed    Diet - low sodium heart healthy   Complete by:  As directed    Increase activity slowly   Complete by:  As directed      Allergies as of 06/03/2018   No Known Allergies      Medication List    STOP taking these medications   ferrous sulfate 325 (65 FE) MG tablet   ibuprofen 200 MG tablet Commonly known as:  ADVIL   lidocaine 5 % Commonly known as:  LIDODERM     TAKE these medications   albuterol 108 (90 Base) MCG/ACT inhaler Commonly known as:  VENTOLIN HFA Inhale 1-2 puffs into the lungs every 6 (six) hours as needed for wheezing or shortness of breath.   albuterol (2.5 MG/3ML) 0.083% nebulizer solution Commonly known as:  PROVENTIL Take 3 mLs (2.5 mg total) by nebulization every 6 (six) hours as needed for wheezing or shortness of breath.   ALPRAZolam 0.5 MG tablet Commonly known as:  XANAX Take 1 tablet (0.5 mg total) by mouth 3 (three) times daily as needed for anxiety or sleep.   amLODipine 5 MG tablet Commonly known as:  NORVASC Take 1 tablet (5 mg total) by mouth daily.   aspirin EC 81 MG tablet Take 81 mg by mouth daily.   gabapentin 300 MG capsule Commonly known as:  NEURONTIN Take 1 capsule (300 mg total) by mouth 3 (three) times daily.   lidocaine-prilocaine cream Commonly known as:  EMLA Apply to affected area once What changed:    how much to take  how to take this  when to take this  reasons to take this  additional instructions   metoprolol tartrate 25 MG tablet Commonly known as:  LOPRESSOR Take 1 tablet (25 mg total) by mouth 2 (two) times daily. What changed:  medication strength   ondansetron 8 MG tablet Commonly known as:  ZOFRAN Take 1 tablet (8 mg total) by mouth every 8 (eight) hours as needed for nausea.   oxyCODONE-acetaminophen 5-325 MG tablet Commonly known as:  PERCOCET/ROXICET Take 1 tablet by mouth every 8 (eight) hours as needed for moderate pain or severe pain (1 tab every 4-6 hour prn pain). What changed:    when to take this  reasons to take this   PARoxetine 20 MG tablet Commonly known as:  PAXIL Take 1 tablet (20 mg total) by mouth 2 (two) times daily.   Premarin vaginal  cream Generic drug:  conjugated estrogens INSERT 1 APPLICATORFUL VAGINALLY DAILY What changed:  See the new instructions.   prochlorperazine 10 MG tablet Commonly known as:  COMPAZINE Take 1 tablet (10 mg total) by mouth every 6 (six) hours as needed (Nausea or vomiting).      Follow-up Information    [name], MD Follow up.   Specialty:  Cardiology Contact information: [address] [place] [address] [phone]        Nicholas Lose, MD Follow up.   Specialty:  Hematology and Oncology Contact information: [place] [address] [phone]        [facility], [facility] Follow up.   Specialty:  Home Health Services Why:  [facility] nursing-pleurx cath drainage care/physical therapy Contact information: [place] [address] [phone]          No Known Allergies  Consultations:  None   Procedures/Studies: Dg Chest 1 View  Result Date: 05/28/2018 CLINICAL DATA:  Post thoracentesis. EXAM:  CHEST  1 VIEW COMPARISON:  Chest x-ray dated 05/27/2018. FINDINGS: Improved aeration of the LEFT hemithorax although large opacity persists. No pneumothorax seen. RIGHT lung is clear. RIGHT chest wall Port-A-Cath appears stable in position with tip at the level of the mid/lower SVC. IMPRESSION: Slightly improved aeration of the LEFT hemithorax status post thoracentesis, although large opacity persists. No pneumothorax seen. Electronically Signed   By: Franki Cabot M.D.   On: 05/28/2018 13:12   Dg Chest 1 View  Result Date: 05/12/2018 CLINICAL DATA:  Status post left thoracentesis. EXAM: CHEST  1 VIEW COMPARISON:  CT chest from same day.  Chest x-ray from yesterday. FINDINGS: Stable cardiomediastinal silhouette with silhouetting of the left heart border. Interval decrease in size of now moderate loculated left pleural effusion. Dense consolidation in the left upper lobe as seen on earlier chest CT. Mildly  improved aeration of the left lung. The right lung remains clear. No pneumothorax. No acute osseous abnormality. IMPRESSION: 1. Decrease in size of now moderate loculated left pleural effusion with mildly improved left lung aeration. No pneumothorax. 2. Persistent dense consolidation in the left upper lobe. Electronically Signed   By: Titus Dubin M.D.   On: 05/12/2018 13:23   Dg Chest 2 View  Result Date: 05/15/2018 CLINICAL DATA:  SOB (shortness of breath) EXAM: CHEST - 2 VIEW COMPARISON:  Radiograph 05/12/2018 FINDINGS: Normal cardiac silhouette. Opacification of the LEFT hemithorax with volume loss again noted. There is some improved aeration to the LEFT lung compared to prior. Large effusion noted. RIGHT lung clear. No pneumothorax. IMPRESSION: 1. Some improved aeration to the LEFT lung. Persistent dense opacification of the entire LEFT hemithorax with volume loss and effusion. 2. RIGHT lung clear. Electronically Signed   By: Suzy Bouchard M.D.   On: 05/15/2018 06:05   Ct Chest W Contrast  Result Date: 05/12/2018 CLINICAL DATA:  Left side chest pain, shortness of breath EXAM: CT CHEST WITH CONTRAST TECHNIQUE: Multidetector CT imaging of the chest was performed during intravenous contrast administration. CONTRAST:  41mL OMNIPAQUE IOHEXOL 300 MG/ML  SOLN COMPARISON:  Chest x-ray 05/11/2018 FINDINGS: Cardiovascular: Heart is normal size. Scattered aortic calcifications. Aorta is normal size. Mediastinum/Nodes: Enlarged mediastinal lymph nodes. Right paratracheal lymph node has a short axis diameter of 1.7 cm on image 56. Prevascular/AP window lymph nodal mass has a short axis diameter of 1.7 cm. No axillary adenopathy or right hilar adenopathy. Difficult to assess the left hilum due to airspace disease and fluid. Lungs/Pleura: Near complete opacification of the left hemithorax due to large partially loculated left effusion and airspace disease. Dense consolidation in the left upper lobe. Compressive  atelectasis in the left lower lobe. No effusion on the right. Ground-glass nodule posteriorly in the right lower lobe measures 11 mm. Upper Abdomen: Imaging into the upper abdomen shows no acute findings. Musculoskeletal: Prior left mastectomy.  No acute bony abnormality. IMPRESSION: Dense consolidative airspace disease in the left upper lobe with large partially loculated left pleural effusion and compressive atelectasis in the left lower lobe. This could be related to pneumonia. A postobstructive process cannot be excluded. Mediastinal adenopathy. Prior left mastectomy. Electronically Signed   By: Rolm Baptise M.D.   On: 05/12/2018 01:25   Ct Angio Chest Pe W And/or Wo Contrast  Result Date: 06/01/2018 CLINICAL DATA:  Dyspnea, multiple recent left thoracentesis procedures, history of left breast cancer. Cytology from 05/12/2018 left thoracentesis demonstrated adenocarcinoma. EXAM: CT ANGIOGRAPHY CHEST WITH CONTRAST TECHNIQUE: Multidetector CT imaging of the chest was performed  using the standard protocol during bolus administration of intravenous contrast. Multiplanar CT image reconstructions and MIPs were obtained to evaluate the vascular anatomy. CONTRAST:  134mL OMNIPAQUE IOHEXOL 350 MG/ML SOLN COMPARISON:  Chest radiograph from earlier today. 05/12/2018 chest CT. FINDINGS: Cardiovascular: The study is moderate quality for the evaluation of pulmonary embolism, with some motion degradation. There are no filling defects in the central, lobar, segmental or subsegmental pulmonary artery branches to suggest acute pulmonary embolism. Atherosclerotic thoracic aorta with stable ectatic 4.0 cm ascending thoracic aorta. Stable dilated main pulmonary artery (3.9 cm diameter). Normal heart size. No significant pericardial fluid/thickening. Right internal jugular Port-A-Cath terminates at the cavoatrial junction. Mediastinum/Nodes: No discrete thyroid nodules. Unremarkable esophagus. No axillary adenopathy. Left  axillary surgical clips again noted. Enlarged 1.4 cm left prevascular node (series 11/image 34), stable. Multiple enlarged right paratracheal nodes up to 2.1 cm (series 11/image 32), mildly increased from 1.9 cm using similar measurement technique. Enlarged 2.1 cm subcarinal node (series 11/image 42), increased from 1.8 cm. Enlarged 1.8 cm left hilar node (series 11/image 36), increased from 1.4 cm. No right hilar adenopathy. Lungs/Pleura: No pneumothorax. Moderate to large left pleural effusion, predominantly basilar. No right pleural effusion. Dense masslike consolidation replacing the left upper lobe measuring up to 12.0 x 5.8 cm (series 11/image 37), not appreciably changed in size, with loss of previously seen apical left upper lobe aeration. Similar moderate to severe compressive atelectasis in the left lower lobe. Basilar right upper lobe 0.8 cm solid pulmonary nodule (series 10/image 69), increased from 0.3 cm. Peripheral right lower lobe 1.3 cm subsolid nodule (series 10/image 60), increased from 1.1 cm and increased in density. Upper abdomen: No acute abnormality. Musculoskeletal: No aggressive appearing focal osseous lesions. Left mastectomy. Superficial subcutaneous round cystic 1.6 cm lesion in midline back (series 11/image 40), unchanged, presumably a sebaceous cyst. Mild thoracic spondylosis. Review of the MIP images confirms the above findings. IMPRESSION: 1. Motion degraded scan. No evidence of pulmonary embolism. 2. Moderate to large left pleural effusion. Dense masslike consolidation replacing the left upper lobe, not appreciably changed in size, likely representing confluent metastatic disease. Worsening aeration in the left lung. 3. Interval growth of two scattered right pulmonary nodules, suspicious for pulmonary metastases. 4. Mediastinal and left hilar adenopathy is stable to mildly increased, suspicious for metastatic nodal disease. 5. Stable ectatic 4.0 cm ascending thoracic aorta.  Recommend annual imaging followup by CTA or MRA. This recommendation follows 2010 ACCF/AHA/AATS/ACR/ASA/SCA/SCAI/SIR/STS/SVM Guidelines for the Diagnosis and Management of Patients with Thoracic Aortic Disease. 2010; 121: Z610-R604. 6. Stable dilated main pulmonary artery, suggesting chronic pulmonary arterial hypertension. Aortic Atherosclerosis (ICD10-I70.0). Electronically Signed   By: Ilona Sorrel M.D.   On: 06/01/2018 20:23   Ir Guided Niel Hummer W Catheter Placement  Result Date: 06/02/2018 CLINICAL DATA:  History of metastatic breast cancer with recurrent symptomatic left-sided pleural effusions. Please perform image guided tunneled pleural drainage catheter for palliative purposes. EXAM: INSERTION OF TUNNELED LEFT SIDED PLEURAL DRAINAGE CATHETER COMPARISON:  Chest CT-06/01/2018; chest radiograph-06/01/2018; left-sided thoracentesis - 05/29/2018; 05/28/2018; 05/12/2018 MEDICATIONS: Ancef 2 gm IV; Antibiotic was administered in an appropriate time interval for the procedure. ANESTHESIA/SEDATION: Moderate (conscious) sedation was employed during this procedure. A total of Versed 1 mg and Fentanyl 50 mcg was administered intravenously. Moderate Sedation Time: 17 minutes. The patient's level of consciousness and vital signs were monitored continuously by radiology nursing throughout the procedure under my direct supervision. FLUOROSCOPY TIME:  FLUOROSCOPY TIME 24 seconds (5.8 mGy) COMPLICATIONS: None immediate. PROCEDURE: The procedure,  risks, benefits, and alternatives were explained to the patient, who wish to proceed with the placement of this permanent pleural catheter as the patient is seeking palliative care. The patient understand and consent to the procedure. The left lateral chest and upper abdomen were prepped with Chlorhexidine in a sterile fashion, and a sterile drape was applied covering the operative field. A sterile gown and sterile gloves were used for the procedure. Initial ultrasound scanning and  fluoroscopic imaging demonstrates a recurrent moderate to large pleural effusion. Under direct ultrasound guidance, the left inferior lateral pleural space was accessed with a Yueh sheath needle after the overlying soft tissues were anesthetized with 1% lidocaine with epinephrine. An Amplatz super stiff wire was then advanced under fluoroscopy into the pleural space. A 15.5 French tunneled Pleur-X catheter was tunneled from an incision within the right upper abdominal quadrant to the access site. The pleural access site was serially dilated under fluoroscopy, ultimately allowing placement of a peel-away sheath. The catheter was advanced through the peel-away sheath. The sheath was then removed. Final catheter positioning was confirmed with a fluoroscopic radiographic image. The access incision was closed with subcutaneous subcuticular 4-0 Vicryl, Dermabond and Steri-Strips. A Prolene retention suture was applied at the catheter exit site. Large volume thoracentesis was performed through the new catheter utilizing provided bulb vacuum assisted drainage bag. The patient tolerated the above procedure well without immediate postprocedural complication. FINDINGS: Preprocedural ultrasound scanning demonstrates a recurrent large sized left sided pleural effusion. After ultrasound and fluoroscopic guided placement, the catheter is directed medial aspect the left lung apex Following catheter placement, approximately 1.75 L of serous pleural fluid was removed. IMPRESSION: Successful placement of permanent, tunneled left pleural drainage catheter via lateral approach. 1.75 liters of serous pleural fluid was removed after catheter placement. Electronically Signed   By: Sandi Mariscal M.D.   On: 06/02/2018 14:36   Dg Chest Port 1 View  Result Date: 06/03/2018 CLINICAL DATA:  Chest tube placement. EXAM: PORTABLE CHEST 1 VIEW COMPARISON:  CT 06/01/2018.  Chest x-ray 06/01/2018. FINDINGS: PowerPort catheter with tip over superior  vena cava in stable position. Left chest tube noted with tip over the upper medial left chest. Interim partial resolution of left-sided pleural effusion. Underlying left lung atelectasis/infiltrate. No pneumothorax. Heart size stable. Surgical clips left axilla. No acute bony abnormality identified. IMPRESSION: Interim placement of left chest tube with partial resolution of left-sided pleural effusion. Underlying left lung atelectasis/infiltrate noted. No pneumothorax. Electronically Signed   By: Marcello Moores  Register   On: 06/03/2018 06:37   Dg Chest Port 1 View  Result Date: 06/01/2018 CLINICAL DATA:  Chest pain EXAM: PORTABLE CHEST 1 VIEW COMPARISON:  05/29/2018 FINDINGS: The right-sided Port-A-Cath well position. There is near complete opacification of the left hemithorax, progressed from prior study. The right lung field is essentially clear. There is no acute osseous abnormality. The cardiac silhouette is poorly evaluated on this examination. IMPRESSION: Near complete opacification of the left hemithorax, worsened since prior study. Findings are suspicious for a growing left-sided pleural effusion with associated compressive atelectasis. Electronically Signed   By: Constance Holster M.D.   On: 06/01/2018 17:20   Dg Chest Port 1 View  Result Date: 05/29/2018 CLINICAL DATA:  Status post thoracentesis EXAM: PORTABLE CHEST 1 VIEW COMPARISON:  Chest radiograph May 28, 2018. FINDINGS: No pneumothorax. There remains moderate pleural effusion on the left. There is opacification of much of the remaining left lung, likely due to a combination of airspace consolidation and a degree of  re-expansion pulmonary edema. The right lung is clear. Heart is mildly enlarged with visualized pulmonary vascularity unremarkable. Port-A-Cath tip is in the superior vena cava. No bone lesions. Surgical clips are noted in the left axilla. IMPRESSION: No pneumothorax. There remains residual effusion on the left, much less than prior to  thoracentesis. Opacification throughout much of the remaining left lung is likely due to a combination of underlying airspace opacity and re-expansion pulmonary edema. Right lung clear. Stable cardiac silhouette. Electronically Signed   By: Lowella Grip III M.D.   On: 05/29/2018 11:52   Dg Chest Port 1 View  Result Date: 05/27/2018 CLINICAL DATA:  Chest pain and shortness of breath EXAM: PORTABLE CHEST 1 VIEW COMPARISON:  May 15, 2018 FINDINGS: There is complete opacification on the left without appreciable shift of heart and mediastinum toward the left. The right lung is clear. Heart appears mildly enlarged with visualized pulmonary vascularity normal. Note that the pulmonary vascular on the left cannot be assessed. Port-A-Cath tip is at the cavoatrial junction. No pneumothorax. No bone lesions. No adenopathy in areas for adenopathy can be assessed. IMPRESSION: Complete opacification on the left. Question combination of consolidation and pleural effusion. Note that the trachea remains essentially midline. Heart is not appear shifted toward the left. Right lung essentially clear. Heart appears prominent although difficult to assess due to the diffuse opacification on the left side. Electronically Signed   By: Lowella Grip III M.D.   On: 05/27/2018 17:35   Dg Chest Port 1 View  Result Date: 05/11/2018 CLINICAL DATA:  Shortness of breath EXAM: PORTABLE CHEST 1 VIEW COMPARISON:  02/04/2017 FINDINGS: There is near complete opacification of the left hemithorax. There is no significant shift of the mediastinum to the right. The cardiac silhouette is suboptimally evaluated secondary to the appearance of the left hemithorax. No definite pneumothorax. The right lung field appears essentially clear. Surgical clips are noted in the left axilla. IMPRESSION: Near complete opacification of the left hemithorax which may be secondary to a large left-sided pleural effusion and/or confluent airspace disease. An  underlying mass cannot be excluded. Electronically Signed   By: Constance Holster M.D.   On: 05/11/2018 21:57   Ir Imaging Guided Port Insertion  Result Date: 05/25/2018 CLINICAL DATA:  Recurrent inflammatory left breast carcinoma, needs durable venous access for chemotherapy EXAM: TUNNELED PORT CATHETER PLACEMENT WITH ULTRASOUND AND FLUOROSCOPIC GUIDANCE FLUOROSCOPY TIME:  0.1 minute; 45  uGym2 DAP ANESTHESIA/SEDATION: Intravenous Fentanyl 174mcg and Versed 3mg  were administered as conscious sedation during continuous monitoring of the patient's level of consciousness and physiological / cardiorespiratory status by the radiology RN, with a total moderate sedation time of 18 minutes. TECHNIQUE: The procedure, risks, benefits, and alternatives were explained to the patient. Questions regarding the procedure were encouraged and answered. The patient understands and consents to the procedure. As antibiotic prophylaxis, cefazolin 2 g was ordered pre-procedure and administered intravenously within one hour of incision. Patency of the right IJ vein was confirmed with ultrasound with image documentation. An appropriate skin site was determined. Skin site was marked. Region was prepped using maximum barrier technique including cap and mask, sterile gown, sterile gloves, large sterile sheet, and Chlorhexidine as cutaneous antisepsis. The region was infiltrated locally with 1% lidocaine. Under real-time ultrasound guidance, the right IJ vein was accessed with a 21 gauge micropuncture needle; the needle tip within the vein was confirmed with ultrasound image documentation. Needle was exchanged over a 018 guidewire for transitional dilator which allowed passage of the [facility]  wire into the IVC. Over this, the transitional dilator was exchanged for a 5 [place] MPA catheter. A small incision was made on the right anterior chest wall and a subcutaneous pocket fashioned. The power-injectable port was positioned and its catheter  tunneled to the right IJ dermatotomy site. The MPA catheter was exchanged over an Amplatz wire for a peel-away sheath, through which the port catheter, which had been trimmed to the appropriate length, was advanced and positioned under fluoroscopy with its tip at the cavoatrial junction. Spot chest radiograph confirms good catheter position and no pneumothorax. The pocket was closed with deep interrupted and subcuticular continuous 3-0 Monocryl sutures. The port was flushed per protocol. The incisions were covered with Dermabond then covered with a sterile dressing. COMPLICATIONS: COMPLICATIONS None immediate IMPRESSION: Technically successful right IJ power-injectable port catheter placement. Ready for routine use. Electronically Signed   By: Lucrezia Europe M.D.   On: 05/25/2018 14:51   Ir Thoracentesis Asp Pleural Space W/img Guide  Result Date: 05/12/2018 INDICATION: Patient with history of left-sided breast cancer, COPD, dyspnea, and left pleural effusion. Request is made for diagnostic and therapeutic left thoracentesis. EXAM: ULTRASOUND GUIDED DIAGNOSTIC AND THERAPEUTIC THORACENTESIS MEDICATIONS: 10 mL 1% lidocaine COMPLICATIONS: None immediate. PROCEDURE: An ultrasound guided thoracentesis was thoroughly discussed with the patient and questions answered. The benefits, risks, alternatives and complications were also discussed. The patient understands and wishes to proceed with the procedure. Written consent was obtained. Ultrasound was performed to localize and mark an adequate pocket of fluid in the left chest. The area was then prepped and draped in the normal sterile fashion. 1% Lidocaine was used for local anesthesia. Under ultrasound guidance a 6 Fr Safe-T-Centesis catheter was introduced. Thoracentesis was performed. The catheter was removed and a dressing applied. FINDINGS: A total of approximately 1.55 L of hazy gold fluid was removed. Procedure was stopped after 1.55 L due to patient  coughing/discomfort. Samples were sent to the laboratory as requested by the clinical team. IMPRESSION: Successful ultrasound guided left thoracentesis yielding 1.55 L of pleural fluid. Read by: Earley Abide, PA-C Electronically Signed   By: Jerilynn Mages.  Shick M.D.   On: 05/12/2018 13:35   US Thoracentesis Asp Pleural Space W/img Guide  Result Date: 05/29/2018 INDICATION: 63 year old female with recurrent left pleural effusion, malignant EXAM: ULTRASOUND GUIDED LEFT THORACENTESIS MEDICATIONS: None COMPLICATIONS: None PROCEDURE: An ultrasound guided thoracentesis was thoroughly discussed with the patient and questions answered. The benefits, risks, alternatives and complications were also discussed. The patient understands and wishes to proceed with the procedure. Written consent was obtained. Ultrasound was performed to localize and mark an adequate pocket of fluid in the left chest. The area was then prepped and draped in the normal sterile fashion. 1% Lidocaine was used for local anesthesia. Under ultrasound guidance a 8 Fr Safe-T-Centesis catheter was introduced. Thoracentesis was performed. The patient did not want to proceed once she started coughing, at a volume of 1.6 L. The catheter was removed and a dressing applied. FINDINGS: A total of approximately 1.6 L of amber fluid was removed. IMPRESSION: Status post ultrasound-guided left thoracentesis with 1.6 L fluid removed. The patient does not wish to proceed to full drainage given her symptoms of coughing and pain. Residual fluid at completion. Signed, Dulcy Fanny. Dellia Nims, RPVI [facility] Radiology Electronically Signed   By: Corrie Mckusick D.O.   On: 05/29/2018 12:21   US Thoracentesis Asp Pleural Space W/img Guide  Result Date: 05/28/2018 INDICATION: Patient with history  of left-sided breast cancer, COPD, dyspnea, and recurrent left pleural effusion. Request is made for therapeutic left thoracentesis. EXAM:  ULTRASOUND GUIDED THERAPEUTIC LEFT THORACENTESIS MEDICATIONS: 10 mL 1% lidocaine COMPLICATIONS: None immediate. PROCEDURE: An ultrasound guided thoracentesis was thoroughly discussed with the patient and questions answered. The benefits, risks, alternatives and complications were also discussed. The patient understands and wishes to proceed with the procedure. Written consent was obtained. Ultrasound was performed to localize and mark an adequate pocket of fluid in the left chest. The area was then prepped and draped in the normal sterile fashion. 1% Lidocaine was used for local anesthesia. Under ultrasound guidance a 6 Fr Safe-T-Centesis catheter was introduced. Thoracentesis was performed. The catheter was removed and a dressing applied. FINDINGS: A total of approximately 1.1 L of hazy gold fluid was removed. Procedure was stopped after 1.1 L per patient request due to patient coughing. IMPRESSION: Successful ultrasound guided left thoracentesis yielding 1.1 L of pleural fluid. Read by: Earley Abide, PA-C No pneumothorax on follow-up radiograph Electronically Signed   By: Lucrezia Europe M.D.   On: 05/28/2018 13:15    (Echo, Carotid, EGD, Colonoscopy, ERCP)    Subjective: Resting in bed no new complaints Pleurx catheter drain in place  Discharge Exam: Vitals:   06/02/18 2118 06/03/18 0452  BP: 110/75 (!) 140/97  Pulse: 80 69  Resp: 18 18  Temp: 98.3 F (36.8 C) 98 F (36.7 C)  SpO2: 91% 91%   Vitals:   06/02/18 1454 06/02/18 1529 06/02/18 2118 06/03/18 0452  BP: 113/73 121/72 110/75 (!) 140/97  Pulse: (!) 56 62 80 69  Resp: 14 16 18 18   Temp: 97.7 F (36.5 C) 98.1 F (36.7 C) 98.3 F (36.8 C) 98 F (36.7 C)  TempSrc: Oral Oral Oral Oral  SpO2: 96% 100% 91% 91%  Weight:      Height:        General: Pt is alert, awake, not in acute distress Cardiovascular: RRR, S1/S2 +, no rubs, no gallops Respiratory: CTA bilaterally, no wheezing, no rhonchi Abdominal: Soft, NT, ND, bowel sounds  + Extremities: no edema, no cyanosis    The results of significant diagnostics from this hospitalization (including imaging, microbiology, ancillary and laboratory) are listed below for reference.     Microbiology: Recent Results (from the past 240 hour(s))  SARS Coronavirus 2 (CEPHEID - Performed in [place] hospital lab), Hosp Order     Status: None   Collection Time: 05/27/18  4:53 PM  Result Value Ref Range Status   SARS Coronavirus 2 NEGATIVE NEGATIVE Final    Comment: (NOTE) If result is NEGATIVE SARS-CoV-2 target nucleic acids are NOT DETECTED. The SARS-CoV-2 RNA is generally detectable in upper and lower  respiratory specimens during the acute phase of infection. The lowest  concentration of SARS-CoV-2 viral copies this assay can detect is 250  copies / mL. A negative result does not preclude SARS-CoV-2 infection  and should not be used as the sole basis for treatment or other  patient management decisions.  A negative result may occur with  improper specimen collection / handling, submission of specimen other  than nasopharyngeal swab, presence of viral mutation(s) within the  areas targeted by this assay, and inadequate number of viral copies  (<250 copies / mL). A negative result must be combined with clinical  observations, patient history, and epidemiological information. If result is POSITIVE SARS-CoV-2 target nucleic acids are DETECTED. The SARS-CoV-2 RNA is generally detectable in upper and lower  respiratory specimens  dur ing the acute phase of infection.  Positive  results are indicative of active infection with SARS-CoV-2.  Clinical  correlation with patient history and other diagnostic information is  necessary to determine patient infection status.  Positive results do  not rule out bacterial infection or co-infection with other viruses. If result is PRESUMPTIVE POSTIVE SARS-CoV-2 nucleic acids MAY BE PRESENT.   A presumptive positive result was obtained  on the submitted specimen  and confirmed on repeat testing.  While 2019 novel coronavirus  (SARS-CoV-2) nucleic acids may be present in the submitted sample  additional confirmatory testing may be necessary for epidemiological  and / or clinical management purposes  to differentiate between  SARS-CoV-2 and other Sarbecovirus currently known to infect humans.  If clinically indicated additional testing with an alternate test  methodology [phone]) is advised. The SARS-CoV-2 RNA is generally  detectable in upper and lower respiratory sp ecimens during the acute  phase of infection. The expected result is Negative. Fact Sheet for Patients:  StrictlyIdeas.no Fact Sheet for Healthcare Providers: BankingDealers.co.za This test is not yet approved or cleared by the [place] FDA and has been authorized for detection and/or diagnosis of SARS-CoV-2 by FDA under an Emergency Use Authorization (EUA).  This EUA will remain in effect (meaning this test can be used) for the duration of the COVID-19 declaration under Section 564(b)(1) of the Act, 21 U.S.C. section 360bbb-3(b)(1), unless the authorization is terminated or revoked sooner. Performed at [facility], [place] [address], [place], [place] [address]   SARS Coronavirus 2 (CEPHEID- Performed in [place] hospital lab), Hosp Order     Status: None   Collection Time: 06/01/18  5:15 PM  Result Value Ref Range Status   SARS Coronavirus 2 NEGATIVE NEGATIVE Final    Comment: (NOTE) If result is NEGATIVE SARS-CoV-2 target nucleic acids are NOT DETECTED. The SARS-CoV-2 RNA is generally detectable in upper and lower  respiratory specimens during the acute phase of infection. The lowest  concentration of SARS-CoV-2 viral copies this assay can detect is 250  copies / mL. A negative result does not preclude SARS-CoV-2 infection  and should not be used as the sole basis for treatment  or other  patient management decisions.  A negative result may occur with  improper specimen collection / handling, submission of specimen other  than nasopharyngeal swab, presence of viral mutation(s) within the  areas targeted by this assay, and inadequate number of viral copies  (<250 copies / mL). A negative result must be combined with clinical  observations, patient history, and epidemiological information. If result is POSITIVE SARS-CoV-2 target nucleic acids are DETECTED. The SARS-CoV-2 RNA is generally detectable in upper and lower  respiratory specimens dur ing the acute phase of infection.  Positive  results are indicative of active infection with SARS-CoV-2.  Clinical  correlation with patient history and other diagnostic information is  necessary to determine patient infection status.  Positive results do  not rule out bacterial infection or co-infection with other viruses. If result is PRESUMPTIVE POSTIVE SARS-CoV-2 nucleic acids MAY BE PRESENT.   A presumptive positive result was obtained on the submitted specimen  and confirmed on repeat testing.  While 2019 novel coronavirus  (SARS-CoV-2) nucleic acids may be present in the submitted sample  additional confirmatory testing may be necessary for epidemiological  and / or clinical management purposes  to differentiate between  SARS-CoV-2 and other Sarbecovirus currently known to infect humans.  If clinically indicated additional testing  with an alternate test  methodology [phone]) is advised. The SARS-CoV-2 RNA is generally  detectable in upper and lower respiratory sp ecimens during the acute  phase of infection. The expected result is Negative. Fact Sheet for Patients:  StrictlyIdeas.no Fact Sheet for Healthcare Providers: BankingDealers.co.za This test is not yet approved or cleared by the [place] FDA and has been authorized for detection and/or diagnosis of  SARS-CoV-2 by FDA under an Emergency Use Authorization (EUA).  This EUA will remain in effect (meaning this test can be used) for the duration of the COVID-19 declaration under Section 564(b)(1) of the Act, 21 U.S.C. section 360bbb-3(b)(1), unless the authorization is terminated or revoked sooner. Performed at [facility], [place] [address], [place], [place] [address]      Labs: BNP (last 3 results) No results for input(s): BNP in the last 8760 hours. Basic Metabolic Panel: Recent Labs  Lab 05/27/18 1653 05/28/18 0601 06/01/18 1714 06/02/18 0110  NA 142 134* 140 139  K 3.5 3.8 3.6 3.7  CL 102 98 100 102  CO2 29 28 32 31  GLUCOSE 105* 487* 88 92  BUN 10 12 7* 7*  CREATININE 0.59 0.69 0.63 0.66  CALCIUM 9.2 8.1* 8.8* 8.7*  MG  --   --   --  2.1  PHOS  --   --   --  3.6   Liver Function Tests: Recent Labs  Lab 05/27/18 1653 06/01/18 1714 06/02/18 0110  AST 10* 10* 11*  ALT 9 10 8   ALKPHOS 58 49 50  BILITOT 0.2* 0.3 0.5  PROT 7.1 6.1* 6.1*  ALBUMIN 3.5 3.0* 3.1*   No results for input(s): LIPASE, AMYLASE in the last 168 hours. No results for input(s): AMMONIA in the last 168 hours. CBC: Recent Labs  Lab 05/27/18 1653 05/28/18 0601 06/01/18 1714 06/02/18 0110  WBC 8.6 5.2 5.3 5.7  NEUTROABS 7.8*  --  3.6  --   HGB 13.3 11.4* 12.9 13.0  HCT 42.3 37.2 40.5 41.1  MCV 98.4 100.5* 96.7 96.3  PLT 280 290 275 287   Cardiac Enzymes: Recent Labs  Lab 06/01/18 1714 06/02/18 0110 06/02/18 0616  TROPONINI 0.03* 0.03* 0.03*   BNP: Invalid input(s): POCBNP CBG: No results for input(s): GLUCAP in the last 168 hours. D-Dimer No results for input(s): DDIMER in the last 72 hours. Hgb A1c No results for input(s): HGBA1C in the last 72 hours. Lipid Profile No results for input(s): CHOL, HDL, LDLCALC, TRIG, CHOLHDL, LDLDIRECT in the last 72 hours. Thyroid function studies Recent Labs    06/02/18 0110  TSH 3.265   Anemia work up No results  for input(s): VITAMINB12, FOLATE, FERRITIN, TIBC, IRON, RETICCTPCT in the last 72 hours. Urinalysis    Component Value Date/Time   COLORURINE YELLOW 12/06/2014 2207   APPEARANCEUR CLOUDY (A) 12/06/2014 2207   LABSPEC 1.025 12/06/2014 2207   PHURINE 5.5 12/06/2014 2207   GLUCOSEU NEGATIVE 12/06/2014 2207   HGBUR MODERATE (A) 12/06/2014 2207   BILIRUBINUR NEGATIVE 12/06/2014 2207   KETONESUR NEGATIVE 12/06/2014 2207   PROTEINUR NEGATIVE 12/06/2014 2207   UROBILINOGEN 1.0 07/30/2013 1527   NITRITE POSITIVE (A) 12/06/2014 2207   LEUKOCYTESUR SMALL (A) 12/06/2014 2207   Sepsis Labs Invalid input(s): PROCALCITONIN,  WBC,  LACTICIDVEN Microbiology Recent Results (from the past 240 hour(s))  SARS Coronavirus 2 (CEPHEID - Performed in [place] hospital lab), Hosp Order     Status: None   Collection Time: 05/27/18  4:53 PM  Result  Value Ref Range Status   SARS Coronavirus 2 NEGATIVE NEGATIVE Final    Comment: (NOTE) If result is NEGATIVE SARS-CoV-2 target nucleic acids are NOT DETECTED. The SARS-CoV-2 RNA is generally detectable in upper and lower  respiratory specimens during the acute phase of infection. The lowest  concentration of SARS-CoV-2 viral copies this assay can detect is 250  copies / mL. A negative result does not preclude SARS-CoV-2 infection  and should not be used as the sole basis for treatment or other  patient management decisions.  A negative result may occur with  improper specimen collection / handling, submission of specimen other  than nasopharyngeal swab, presence of viral mutation(s) within the  areas targeted by this assay, and inadequate number of viral copies  (<250 copies / mL). A negative result must be combined with clinical  observations, patient history, and epidemiological information. If result is POSITIVE SARS-CoV-2 target nucleic acids are DETECTED. The SARS-CoV-2 RNA is generally detectable in upper and lower  respiratory specimens dur ing  the acute phase of infection.  Positive  results are indicative of active infection with SARS-CoV-2.  Clinical  correlation with patient history and other diagnostic information is  necessary to determine patient infection status.  Positive results do  not rule out bacterial infection or co-infection with other viruses. If result is PRESUMPTIVE POSTIVE SARS-CoV-2 nucleic acids MAY BE PRESENT.   A presumptive positive result was obtained on the submitted specimen  and confirmed on repeat testing.  While 2019 novel coronavirus  (SARS-CoV-2) nucleic acids may be present in the submitted sample  additional confirmatory testing may be necessary for epidemiological  and / or clinical management purposes  to differentiate between  SARS-CoV-2 and other Sarbecovirus currently known to infect humans.  If clinically indicated additional testing with an alternate test  methodology [phone]) is advised. The SARS-CoV-2 RNA is generally  detectable in upper and lower respiratory sp ecimens during the acute  phase of infection. The expected result is Negative. Fact Sheet for Patients:  StrictlyIdeas.no Fact Sheet for Healthcare Providers: BankingDealers.co.za This test is not yet approved or cleared by the [place] FDA and has been authorized for detection and/or diagnosis of SARS-CoV-2 by FDA under an Emergency Use Authorization (EUA).  This EUA will remain in effect (meaning this test can be used) for the duration of the COVID-19 declaration under Section 564(b)(1) of the Act, 21 U.S.C. section 360bbb-3(b)(1), unless the authorization is terminated or revoked sooner. Performed at [facility], [place] [address], [place], [place] [address]   SARS Coronavirus 2 (CEPHEID- Performed in [place] hospital lab), Hosp Order     Status: None   Collection Time: 06/01/18  5:15 PM  Result Value Ref Range Status   SARS Coronavirus 2  NEGATIVE NEGATIVE Final    Comment: (NOTE) If result is NEGATIVE SARS-CoV-2 target nucleic acids are NOT DETECTED. The SARS-CoV-2 RNA is generally detectable in upper and lower  respiratory specimens during the acute phase of infection. The lowest  concentration of SARS-CoV-2 viral copies this assay can detect is 250  copies / mL. A negative result does not preclude SARS-CoV-2 infection  and should not be used as the sole basis for treatment or other  patient management decisions.  A negative result may occur with  improper specimen collection / handling, submission of specimen other  than nasopharyngeal swab, presence of viral mutation(s) within the  areas targeted by this assay, and inadequate number of viral copies  (<250 copies /  mL). A negative result must be combined with clinical  observations, patient history, and epidemiological information. If result is POSITIVE SARS-CoV-2 target nucleic acids are DETECTED. The SARS-CoV-2 RNA is generally detectable in upper and lower  respiratory specimens dur ing the acute phase of infection.  Positive  results are indicative of active infection with SARS-CoV-2.  Clinical  correlation with patient history and other diagnostic information is  necessary to determine patient infection status.  Positive results do  not rule out bacterial infection or co-infection with other viruses. If result is PRESUMPTIVE POSTIVE SARS-CoV-2 nucleic acids MAY BE PRESENT.   A presumptive positive result was obtained on the submitted specimen  and confirmed on repeat testing.  While 2019 novel coronavirus  (SARS-CoV-2) nucleic acids may be present in the submitted sample  additional confirmatory testing may be necessary for epidemiological  and / or clinical management purposes  to differentiate between  SARS-CoV-2 and other Sarbecovirus currently known to infect humans.  If clinically indicated additional testing with an alternate test  methodology [phone])  is advised. The SARS-CoV-2 RNA is generally  detectable in upper and lower respiratory sp ecimens during the acute  phase of infection. The expected result is Negative. Fact Sheet for Patients:  StrictlyIdeas.no Fact Sheet for Healthcare Providers: BankingDealers.co.za This test is not yet approved or cleared by the [place] FDA and has been authorized for detection and/or diagnosis of SARS-CoV-2 by FDA under an Emergency Use Authorization (EUA).  This EUA will remain in effect (meaning this test can be used) for the duration of the COVID-19 declaration under Section 564(b)(1) of the Act, 21 U.S.C. section 360bbb-3(b)(1), unless the authorization is terminated or revoked sooner. Performed at [facility], [place] [address], [place], [place] [address]      Time coordinating discharge: 34  minutes  SIGNED:   Georgette Shell, MD  [facility] 06/03/2018, 9:19 AM Pager   If 7PM-7AM, please contact night-coverage [email or web address] Password [email or web address]

## 2018-06-06 ENCOUNTER — Ambulatory Visit (HOSPITAL_COMMUNITY): Payer: Medicare Other

## 2018-06-06 ENCOUNTER — Other Ambulatory Visit (HOSPITAL_COMMUNITY): Payer: Medicare Other

## 2018-06-08 NOTE — Progress Notes (Signed)
[facility] Follow up:    Deborah Dials, MD [address] 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    12/26/2014 Initial Diagnosis    Left breast biopsy: Invasive ductal carcinoma with lymphovascular invasion, 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, grade 3, ER/PR negative, Ki-67 80% HER-2 Neg    01/18/2015 - 06/21/2015 Neo-Adjuvant Chemotherapy    Dose dense Adriamycin and Cytoxan 4 followed by Taxol and carboplatin [id number]    07/03/2015 Breast MRI    Improvement in multiple areas of enhancement largest area 3.4 cm other suspicious nodules anterior to the mass also improved, left axillary internal mammary lymph nodes improved    08/23/2015 Surgery    Left mastectomy with axillary lymph node dissection: Invasive ductal carcinoma with calcifications grade 3, 1.3 cm, image, margins negative, 1/6 lymph nodes positive with extracapsular extension, ER 0%, 0%, T1cN1a stage II a     10/31/2015 - 01/08/2016 Radiation Therapy    Adj XRT Lisbeth Renshaw): Left Chest Wall and Left SCLV treated to 50.4 Gy in 28 fractions of 1.8 Gy. Left Chest Wall was then boosted to 60.4 Gy in 5 fractions of 2 Gy.    02/03/2016 - 08/03/2016 Chemotherapy    Xeloda 1500 mg by mouth twice a day 2 weeks on one week off    02/04/2017 - 02/10/2017 Hospital Admission    Admitted for bilateral pneumonia    05/13/2018 Relapse/Recurrence    Left pleural  effusion with left upper lobe consolidation and paratracheal and AP window lymphadenopathy: Thoracentesis: Cytology, adenocarcinoma consistent with breast primary, positive for GA TA-3 and CK7, negative for CK20 and TTF-1, ER PR HER-2 negative    06/09/2018 -  Chemotherapy    The patient had eriBULin mesylate (HALAVEN) 2.8 mg in sodium chloride 0.9 % 100 mL chemo infusion, 1.4 mg/m2 = 2.8 mg, Intravenous,  Once, 1 of 4 cycles Administration: 2.8 mg (06/09/2018)  for chemotherapy treatment.      CURRENT THERAPY: Eribulin  INTERVAL HISTORY: Deborah Mcintyre 63 y.o. female returns for evaluation of her newly metastatic breast cancer.  She has unfortunately had several previous hospitalizations due to recurrent malignant pleural effusions.  She has a pleurx now that she is drianing every other day.  She says she will drain abourt 200-450m out of her lung, but she does get some pain sometimes, and wants it starts to hurt, she will stop.  She does continue to have a cough, and mild shortness of breath.  Otherwise she is feeling moderately well.  FJaycelynwants to know more about her treatment and her diagnosis.  She wants to know if the chemotherapy will make her sick like her previous chemotherapy did.  She wants to know what the next steps are for her treatment.     Patient Active Problem List   Diagnosis Date Noted  . Status post chest tube placement ([facility]   . Tobacco abuse 06/01/2018  .  Prolonged QT interval 06/01/2018  . Chest pain 06/01/2018  . Elevated troponin 06/01/2018  . Cigarette smoker 05/27/2018  . Goals of care, counseling/discussion 05/19/2018  . 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  . Hypertension 05/12/2018  . COPD (chronic obstructive pulmonary disease) ([place]) 05/12/2018  . Anxiety and depression 05/12/2018  . Community acquired pneumonia of left lung 02/04/2017  . Antineoplastic chemotherapy induced pancytopenia ([place]) 04/26/2015  . Antineoplastic chemotherapy induced anemia  04/05/2015  . Breast cancer of upper-outer quadrant of left female breast ([place]) 01/01/2015  . Acute exacerbation of chronic bronchitis ([place]) 08/03/2013    has No Known Allergies.  MEDICAL HISTORY: Past Medical History:  Diagnosis Date  . Anxiety   . Arthritis   . Bronchitis   . COPD (chronic obstructive pulmonary disease) ([place])   . Depression   . Hypertension   . Malignant neoplasm of upper-outer quadrant of left female breast ([place]) 01/04/2015  . Nocturia   . Numbness and tingling    toes - bilateral  . Pneumonia   . PONV (postoperative nausea and vomiting)    Nausea    SURGICAL HISTORY: Past Surgical History:  Procedure Laterality Date  . BREAST LUMPECTOMY Bilateral    x3  . BREAST LUMPECTOMY WITH RADIOACTIVE SEED LOCALIZATION Right 08/23/2015   Procedure: RIGHT BREAST LUMPECTOMY WITH RADIOACTIVE SEED LOCALIZATION;  Surgeon: Alphonsa Overall, MD;  Location: [facility];  Service: General;  Laterality: Right;  . CESAREAN SECTION     x4  . IR GUIDED DRAIN W CATHETER PLACEMENT  06/02/2018  . IR IMAGING GUIDED PORT INSERTION  05/25/2018  . IR REMOVAL TUN ACCESS W/ PORT W/O FL MOD SED  10/20/2016  . IR THORACENTESIS ASP PLEURAL SPACE W/IMG GUIDE  05/12/2018  . MASTECTOMY Left 08/23/2015    RIGHT BREAST LUMPECTOMY WITH RADIOACTIVE SEED LOCALIZATION,  LEFT MASTECTOMY WITH AXILLARY LYMPH NODE DISSECTION  . MASTECTOMY WITH AXILLARY LYMPH NODE DISSECTION Left 08/23/2015   Procedure: LEFT MASTECTOMY WITH AXILLARY LYMPH NODE DISSECTION;  Surgeon: Alphonsa Overall, MD;  Location: [facility];  Service: General;  Laterality: Left;  [name] MULTIPLE TOOTH EXTRACTIONS      SOCIAL HISTORY: Social History   Socioeconomic History  . Marital status: Single    Spouse name: Not on file  . Number of children: Not on file  . Years of education: Not on file  . Highest education level: Not on file  Occupational History  . Not on file  Social Needs  . Financial resource strain: Not on file  . Food insecurity:     Worry: Not on file    Inability: Not on file  . Transportation needs:    Medical: Not on file    Non-medical: Not on file  Tobacco Use  . Smoking status: Current Every Day Smoker    Packs/day: 0.75    Years: 45.00    Pack years: 33.75    Types: Cigarettes  . Smokeless tobacco: Never Used  Substance and Sexual Activity  . Alcohol use: Yes    Comment: ocassionally  . Drug use: No  . Sexual activity: Not on file  Lifestyle  . Physical activity:    Days per week: Not on file    Minutes per session: Not on file  . Stress: Not on file  Relationships  . Social connections:    Talks on phone: Not on file    Gets together: Not on file    Attends religious service: Not on file    Active member of club or organization:  Not on file    Attends meetings of clubs or organizations: Not on file    Relationship status: Not on file  . Intimate partner violence:    Fear of current or ex partner: Not on file    Emotionally abused: Not on file    Physically abused: Not on file    Forced sexual activity: Not on file  Other Topics Concern  . Not on file  Social History Narrative  . Not on file    FAMILY HISTORY: Family History  Problem Relation Age of Onset  . Breast cancer Maternal Aunt     Review of Systems  Constitutional: Negative for appetite change, chills, fatigue, fever and unexpected weight change.  HENT:   Negative for hearing loss, lump/mass, mouth sores, nosebleeds, sore throat and trouble swallowing.   Eyes: Negative for eye problems and icterus.  Respiratory: Positive for cough and shortness of breath. Negative for chest tightness, hemoptysis and wheezing.   Cardiovascular: Negative for chest pain, leg swelling and palpitations.  Gastrointestinal: Negative for abdominal distention, abdominal pain, constipation, diarrhea, nausea and vomiting.  Endocrine: Negative for hot flashes.  Musculoskeletal: Negative for arthralgias.  Skin: Negative for itching and rash.   Neurological: Negative for dizziness, extremity weakness, headaches and numbness.  Hematological: Negative for adenopathy. Does not bruise/bleed easily.  Psychiatric/Behavioral: Negative for depression and suicidal ideas. The patient is not nervous/anxious.       PHYSICAL EXAMINATION  ECOG PERFORMANCE STATUS: 2 - Symptomatic, <50% confined to bed  Vitals:   06/09/18 1057  BP: 130/82  Pulse: 98  Resp: 18  Temp: 98 F (36.7 C)  SpO2: 99%    Physical Exam Constitutional:      General: She is not in acute distress.    Appearance: Normal appearance. She is not toxic-appearing.  HENT:     Head: Normocephalic.     Mouth/Throat:     Mouth: Mucous membranes are moist.     Pharynx: Oropharynx is clear. No oropharyngeal exudate or posterior oropharyngeal erythema.  Eyes:     General: No scleral icterus.    Pupils: Pupils are equal, round, and reactive to light.  Neck:     Musculoskeletal: Neck supple.  Cardiovascular:     Rate and Rhythm: Normal rate and regular rhythm.     Pulses: Normal pulses.     Heart sounds: Normal heart sounds.  Pulmonary:     Effort: Pulmonary effort is normal.     Comments: Diminished in left lower lobe  Abdominal:     General: Abdomen is flat. There is no distension.     Palpations: Abdomen is soft.     Tenderness: There is no abdominal tenderness.  Musculoskeletal:        General: No swelling.  Lymphadenopathy:     Cervical: No cervical adenopathy.  Skin:    General: Skin is warm and dry.     Capillary Refill: Capillary refill takes less than 2 seconds.     Findings: No rash.  Neurological:     General: No focal deficit present.     Mental Status: She is alert.  Psychiatric:        Mood and Affect: Mood normal.        Behavior: Behavior normal.     LABORATORY DATA:  CBC    Component Value Date/Time   WBC 6.6 06/09/2018 0948   WBC 5.7 06/02/2018 0110   RBC 4.51 06/09/2018 0948   HGB 13.3 06/09/2018 0948   HGB 14.0  01/07/2017  1308   HCT 42.2 06/09/2018 0948   HCT 43.5 01/07/2017 1308   PLT 327 06/09/2018 0948   PLT 142 (L) 01/07/2017 1308   MCV 93.6 06/09/2018 0948   MCV 101.2 (H) 01/07/2017 1308   MCH 29.5 06/09/2018 0948   MCHC 31.5 06/09/2018 0948   RDW 12.9 06/09/2018 0948   RDW 13.1 01/07/2017 1308   LYMPHSABS 1.0 06/09/2018 0948   LYMPHSABS 1.1 01/07/2017 1308   MONOABS 0.6 06/09/2018 0948   MONOABS 0.4 01/07/2017 1308   EOSABS 0.2 06/09/2018 0948   EOSABS 0.1 01/07/2017 1308   BASOSABS 0.0 06/09/2018 0948   BASOSABS 0.0 01/07/2017 1308    CMP     Component Value Date/Time   NA 140 06/09/2018 0948   NA 142 01/07/2017 1308   K 3.7 06/09/2018 0948   K 3.9 01/07/2017 1308   CL 103 06/09/2018 0948   CO2 27 06/09/2018 0948   CO2 26 01/07/2017 1308   GLUCOSE 88 06/09/2018 0948   GLUCOSE 107 01/07/2017 1308   BUN 8 06/09/2018 0948   BUN 9.7 01/07/2017 1308   CREATININE 0.80 06/09/2018 0948   CREATININE 0.8 01/07/2017 1308   CALCIUM 8.9 06/09/2018 0948   CALCIUM 9.2 01/07/2017 1308   PROT 6.4 (L) 06/09/2018 0948   PROT 7.4 01/07/2017 1308   ALBUMIN 3.1 (L) 06/09/2018 0948   ALBUMIN 3.9 01/07/2017 1308   AST 12 (L) 06/09/2018 0948   AST 12 01/07/2017 1308   ALT <6 06/09/2018 0948   ALT 8 01/07/2017 1308   ALKPHOS 56 06/09/2018 0948   ALKPHOS 69 01/07/2017 1308   BILITOT 0.4 06/09/2018 0948   BILITOT 0.40 01/07/2017 1308   GFRNONAA >60 06/09/2018 0948   GFRAA >60 06/09/2018 0948           ASSESSMENT and THERAPY PLAN:   Breast cancer of upper-outer quadrant of left female breast ([place]) 12/26/14: inflammatory breast cancerInvasive ductal carcinoma with lymphovascular invasion, 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  Patient received neoadjuvant chemotherapy with dose  dense Adriamycin and Cytoxan followed by Taxol and carboplatin followed by left mastectomy and axillary lymph node dissection 08/23/2015 followed by adjuvant radiation completed 01/08/2016  Metastatic breast cancer: 05/13/2018:Left pleural effusion with left upper lobe consolidation and paratracheal and AP window lymphadenopathy: Thoracentesis: Cytology, adenocarcinoma consistent with breast primary, positive for GA TA-3 and CK7, negative for CK20 and TTF-1, ER PR HER-2 negative.  Treatment plan: Palliative chemotherapy with Halaven days 1 and 8 every 3 weeks Send tumor for Caris molecular testing _____________________________________________________________________________  Sherald Hess cycle 1 day 1  I reviewed the risks and benefits of treatment with the patient today.  She undertands these and is agreeable to proceed.  We had a lenghty discussion about her bresat cancer.  I explained ot her that her breast cancer is metastatic.  Meaning that her breast cancer has spread to her lung and is causing the issue with the pleural effusion.  I reviewed that right now, since we just found these issues, we still have a few more things to do to help guide her treatment and care.    1. We need to get her PET scan scheduled.    2. We need to get her CARIS results back.    3.  Referral to nutrition to discuss her diet and recent unintentional weight loss of 25 pounds.  I reviewed in detail what the above to items mean and entail.  We reviewed that with metastatic breast cancer our goal is to control the disease.  I informed her that the eribulin is not as nauseating as her previous regimens, and that if she is nauseated, to please take the compazine she has if needed.  I will have my nurse get her PET scan scheduled and f/u on her CARIS testing.  I gave [place] information in her AVS about what needs to be done next, and also, information about her cancer treatment.    Indiyah will return in 1 week for labs and  Eribulin.  We will see her in f/u on day 1 of her next cycle.      All questions were answered. The patient knows to call the clinic with any problems, questions or concerns. We can certainly see the patient much sooner if necessary.  A total of (40) minutes of face-to-face time was spent with this patient with greater than 50% of that time in counseling and care-coordination.   This note was electronically signed. Scot Dock, NP 06/09/2018

## 2018-06-08 NOTE — Assessment & Plan Note (Addendum)
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  Patient received neoadjuvant chemotherapy with dose dense Adriamycin and Cytoxan followed by Taxol and carboplatin followed by left mastectomy and axillary lymph node dissection 08/23/2015 followed by adjuvant radiation completed 01/08/2016  Metastatic breast cancer: 05/13/2018:Left pleural effusion with left upper lobe consolidation and paratracheal and AP window lymphadenopathy: Thoracentesis: Cytology, adenocarcinoma consistent with breast primary, positive for GA TA-3 and CK7, negative for CK20 and TTF-1, ER PR HER-2 negative.  Treatment plan: Palliative chemotherapy with Halaven days 1 and 8 every 3 weeks Send tumor for Caris molecular testing _____________________________________________________________________________  Deborah Mcintyre cycle 1 day 1  I reviewed the risks and benefits of treatment with the patient today.  She undertands these and is agreeable to proceed.  We had a lenghty discussion about her bresat cancer.  I explained ot her that her breast cancer is metastatic.  Meaning that her breast cancer has spread to her lung and is causing the issue with the pleural effusion.  I reviewed that right now, since we just found these issues, we still have a few more things to do to help guide her treatment and care.    1. We need to get her PET scan scheduled.    2. We need to get her CARIS results back.    3. Referral to nutrition to discuss her diet and recent unintentional weight loss of 25 pounds.  I reviewed in detail what the above to items mean and entail.  We reviewed that with metastatic breast cancer our goal is to control the disease.  I informed  her that the eribulin is not as nauseating as her previous regimens, and that if she is nauseated, to please take the compazine she has if needed.  I will have my nurse get her PET scan scheduled and f/u on her CARIS testing.  I gave Deborah Mcintyre information in her AVS about what needs to be done next, and also, information about her cancer treatment.    Deborah Mcintyre will return in 1 week for labs and Eribulin.  We will see her in f/u on day 1 of her next cycle.

## 2018-06-09 ENCOUNTER — Other Ambulatory Visit: Payer: Self-pay

## 2018-06-09 ENCOUNTER — Inpatient Hospital Stay (HOSPITAL_BASED_OUTPATIENT_CLINIC_OR_DEPARTMENT_OTHER): Payer: Medicare Other | Admitting: Adult Health

## 2018-06-09 ENCOUNTER — Inpatient Hospital Stay: Payer: Medicare Other

## 2018-06-09 ENCOUNTER — Encounter: Payer: Self-pay | Admitting: Adult Health

## 2018-06-09 ENCOUNTER — Inpatient Hospital Stay: Payer: Medicare Other | Attending: Hematology and Oncology

## 2018-06-09 VITALS — BP 130/82 | HR 98 | Temp 98.0°F | Resp 18 | Ht 67.5 in | Wt 174.1 lb

## 2018-06-09 DIAGNOSIS — C50412 Malignant neoplasm of upper-outer quadrant of left female breast: Secondary | ICD-10-CM | POA: Insufficient documentation

## 2018-06-09 DIAGNOSIS — Z9012 Acquired absence of left breast and nipple: Secondary | ICD-10-CM | POA: Insufficient documentation

## 2018-06-09 DIAGNOSIS — Z5111 Encounter for antineoplastic chemotherapy: Secondary | ICD-10-CM | POA: Diagnosis present

## 2018-06-09 DIAGNOSIS — Z171 Estrogen receptor negative status [ER-]: Secondary | ICD-10-CM | POA: Diagnosis not present

## 2018-06-09 DIAGNOSIS — R634 Abnormal weight loss: Secondary | ICD-10-CM | POA: Diagnosis not present

## 2018-06-09 DIAGNOSIS — F17210 Nicotine dependence, cigarettes, uncomplicated: Secondary | ICD-10-CM

## 2018-06-09 DIAGNOSIS — Z923 Personal history of irradiation: Secondary | ICD-10-CM | POA: Insufficient documentation

## 2018-06-09 DIAGNOSIS — R630 Anorexia: Secondary | ICD-10-CM | POA: Insufficient documentation

## 2018-06-09 DIAGNOSIS — Z7189 Other specified counseling: Secondary | ICD-10-CM

## 2018-06-09 DIAGNOSIS — F329 Major depressive disorder, single episode, unspecified: Secondary | ICD-10-CM

## 2018-06-09 DIAGNOSIS — Z7982 Long term (current) use of aspirin: Secondary | ICD-10-CM | POA: Insufficient documentation

## 2018-06-09 DIAGNOSIS — Z803 Family history of malignant neoplasm of breast: Secondary | ICD-10-CM

## 2018-06-09 DIAGNOSIS — C773 Secondary and unspecified malignant neoplasm of axilla and upper limb lymph nodes: Secondary | ICD-10-CM | POA: Diagnosis not present

## 2018-06-09 DIAGNOSIS — G893 Neoplasm related pain (acute) (chronic): Secondary | ICD-10-CM | POA: Diagnosis not present

## 2018-06-09 DIAGNOSIS — I10 Essential (primary) hypertension: Secondary | ICD-10-CM | POA: Insufficient documentation

## 2018-06-09 DIAGNOSIS — J449 Chronic obstructive pulmonary disease, unspecified: Secondary | ICD-10-CM | POA: Insufficient documentation

## 2018-06-09 DIAGNOSIS — J90 Pleural effusion, not elsewhere classified: Secondary | ICD-10-CM | POA: Insufficient documentation

## 2018-06-09 DIAGNOSIS — Z79899 Other long term (current) drug therapy: Secondary | ICD-10-CM | POA: Insufficient documentation

## 2018-06-09 LAB — CBC WITH DIFFERENTIAL (CANCER CENTER ONLY)
Abs Immature Granulocytes: 0.04 10*3/uL (ref 0.00–0.07)
Basophils Absolute: 0 10*3/uL (ref 0.0–0.1)
Basophils Relative: 1 %
Eosinophils Absolute: 0.2 10*3/uL (ref 0.0–0.5)
Eosinophils Relative: 2 %
HCT: 42.2 % (ref 36.0–46.0)
Hemoglobin: 13.3 g/dL (ref 12.0–15.0)
Immature Granulocytes: 1 %
Lymphocytes Relative: 15 %
Lymphs Abs: 1 10*3/uL (ref 0.7–4.0)
MCH: 29.5 pg (ref 26.0–34.0)
MCHC: 31.5 g/dL (ref 30.0–36.0)
MCV: 93.6 fL (ref 80.0–100.0)
Monocytes Absolute: 0.6 10*3/uL (ref 0.1–1.0)
Monocytes Relative: 9 %
Neutro Abs: 4.8 10*3/uL (ref 1.7–7.7)
Neutrophils Relative %: 72 %
Platelet Count: 327 10*3/uL (ref 150–400)
RBC: 4.51 MIL/uL (ref 3.87–5.11)
RDW: 12.9 % (ref 11.5–15.5)
WBC Count: 6.6 10*3/uL (ref 4.0–10.5)
nRBC: 0 % (ref 0.0–0.2)

## 2018-06-09 LAB — CMP (CANCER CENTER ONLY)
ALT: <6 U/L (ref 0–44)
AST: 12 U/L — ABNORMAL LOW (ref 15–41)
Albumin: 3.1 g/dL — ABNORMAL LOW (ref 3.5–5.0)
Alkaline Phosphatase: 56 U/L (ref 38–126)
Anion gap: 10 (ref 5–15)
BUN: 8 mg/dL (ref 8–23)
CO2: 27 mmol/L (ref 22–32)
Calcium: 8.9 mg/dL (ref 8.9–10.3)
Chloride: 103 mmol/L (ref 98–111)
Creatinine: 0.8 mg/dL (ref 0.44–1.00)
GFR, Est AFR Am: >60 mL/min (ref >60)
GFR, Estimated: >60 mL/min (ref >60)
Glucose, Bld: 88 mg/dL (ref 70–99)
Potassium: 3.7 mmol/L (ref 3.5–5.1)
Sodium: 140 mmol/L (ref 135–145)
Total Bilirubin: 0.4 mg/dL (ref 0.3–1.2)
Total Protein: 6.4 g/dL — ABNORMAL LOW (ref 6.5–8.1)

## 2018-06-09 MED ORDER — SODIUM CHLORIDE 0.9% FLUSH
10.0000 mL | INTRAVENOUS | Status: DC | PRN
  Administered 2018-06-09: 10 mL
  Filled 2018-06-09: qty 10

## 2018-06-09 MED ORDER — PROCHLORPERAZINE MALEATE 10 MG PO TABS
ORAL_TABLET | ORAL | Status: AC
  Filled 2018-06-09: qty 1

## 2018-06-09 MED ORDER — SODIUM CHLORIDE 0.9 % IV SOLN
Freq: Once | INTRAVENOUS | Status: AC
  Administered 2018-06-09: 12:00:00 via INTRAVENOUS
  Filled 2018-06-09: qty 250

## 2018-06-09 MED ORDER — HEPARIN SOD (PORK) LOCK FLUSH 100 UNIT/ML IV SOLN
500.0000 [IU] | Freq: Once | INTRAVENOUS | Status: AC | PRN
  Administered 2018-06-09: 500 [IU]
  Filled 2018-06-09: qty 5

## 2018-06-09 MED ORDER — PROCHLORPERAZINE MALEATE 10 MG PO TABS
10.0000 mg | ORAL_TABLET | Freq: Once | ORAL | Status: AC
  Administered 2018-06-09: 10 mg via ORAL

## 2018-06-09 MED ORDER — SODIUM CHLORIDE 0.9 % IJ SOLN: 10.0000 mL | INTRAMUSCULAR | Status: DC | PRN

## 2018-06-09 MED ORDER — SODIUM CHLORIDE 0.9 % IV SOLN
1.4000 mg/m2 | Freq: Once | INTRAVENOUS | Status: AC
  Administered 2018-06-09: 2.8 mg via INTRAVENOUS
  Filled 2018-06-09: qty 5.6

## 2018-06-09 NOTE — Patient Instructions (Signed)
[facility] Discharge Instructions for Patients Receiving Chemotherapy  Today you received the following chemotherapy agent: Halaven   To help prevent nausea and vomiting after your treatment, we encourage you to take your nausea medication as directed.   If you develop nausea and vomiting that is not controlled by your nausea medication, call the clinic.   BELOW ARE SYMPTOMS THAT SHOULD BE REPORTED IMMEDIATELY:  *FEVER GREATER THAN 100.5 F  *CHILLS WITH OR WITHOUT FEVER  NAUSEA AND VOMITING THAT IS NOT CONTROLLED WITH YOUR NAUSEA MEDICATION  *UNUSUAL SHORTNESS OF BREATH  *UNUSUAL BRUISING OR BLEEDING  TENDERNESS IN MOUTH AND THROAT WITH OR WITHOUT PRESENCE OF ULCERS  *URINARY PROBLEMS  *BOWEL PROBLEMS  UNUSUAL RASH Items with * indicate a potential emergency and should be followed up as soon as possible.  Feel free to call the clinic should you have any questions or concerns. The clinic phone number is [phone].  Please show the [facility] at check-in to the Emergency Department and triage nurse.  Eribulin solution for injection What is this medicine? ERIBULIN (er e bu lin) is a chemotherapy drug. It is used to treat breast cancer and liposarcoma. This medicine may be used for other purposes; ask your health care provider or pharmacist if you have questions. COMMON BRAND NAME(S): Halaven What should I tell my health care provider before I take this medicine? They need to know if you have any of these conditions: -heart disease -history of irregular heartbeat -kidney disease -liver disease -low blood counts, like low white cell, platelet, or red cell counts -low levels of potassium or magnesium in the blood -an unusual or allergic reaction to eribulin, other medicines, foods, dyes, or preservatives -pregnant or trying to get pregnant -breast-feeding How should I use this medicine? This medicine is for infusion into a vein. It is given by a  health care professional in a hospital or clinic setting. Talk to your pediatrician regarding the use of this medicine in children. Special care may be needed. Overdosage: If you think you have taken too much of this medicine contact a poison control center or emergency room at once. NOTE: This medicine is only for you. Do not share this medicine with others. What if I miss a dose? It is important not to miss your dose. Call your doctor or health care professional if you are unable to keep an appointment. What may interact with this medicine? Do not take this medicine with any of the following medications: -amiodarone -astemizole -arsenic trioxide -bepridil -bretylium -chloroquine -chlorpromazine -cisapride -clarithromycin -dextromethorphan, quinidine -disopyramide -dofetilide -droperidol -dronedarone -erythromycin -grepafloxacin -halofantrine -haloperidol -ibutilide -levomethadyl -mesoridazine -methadone -pentamidine -procainamide -quinidine -pimozide -posaconazole -probucol -propafenone -saquinavir -sotalol -sparfloxacin -terfenadine -thioridazine -troleandomycin -ziprasidone This list may not describe all possible interactions. Give your health care provider a list of all the medicines, herbs, non-prescription drugs, or dietary supplements you use. Also tell them if you smoke, drink alcohol, or use illegal drugs. Some items may interact with your medicine. What should I watch for while using this medicine? This drug may make you feel generally unwell. This is not uncommon, as chemotherapy can affect healthy cells as well as cancer cells. Report any side effects. Continue your course of treatment even though you feel ill unless your doctor tells you to stop. Call your doctor or health care professional for advice if you get a fever, chills or sore throat, or other symptoms of a cold or flu. Do not treat yourself. This drug decreases  your body's ability to fight  infections. Try to avoid being around people who are sick. This medicine may increase your risk to bruise or bleed. Call your doctor or health care professional if you notice any unusual bleeding. You may need blood work done while you are taking this medicine. Do not become pregnant while taking this medicine or for 2 weeks after stopping it. Women should inform their doctor if they wish to become pregnant or think they might be pregnant. Men should not father a child while taking this medicine and for 3.5 months after stopping it. There is a potential for serious side effects to an unborn child. Talk to your health care professional or pharmacist for more information. Do not breast-feed an infant while taking this medicine or for 2 weeks after stopping it. What side effects may I notice from receiving this medicine? Side effects that you should report to your doctor or health care professional as soon as possible: -allergic reactions like skin rash, itching or hives, swelling of the face, lips, or tongue -low blood counts - this medicine may decrease the number of white blood cells, red blood cells and platelets. You may be at increased risk for infections and bleeding. -signs of infection - fever or chills, cough, sore throat, pain or difficulty passing urine -signs of decreased platelets or bleeding - bruising, pinpoint red spots on the skin, black, tarry stools, blood in the urine -signs of decreased red blood cells - unusually weak or tired, fainting spells, lightheadedness -pain, tingling, numbness in the hands or feet Side effects that usually do not require medical attention (report to your doctor or health care professional if they continue or are bothersome): -constipation -hair loss -headache -loss of appetite -muscle or joint pain -nausea, vomiting -stomach pain This list may not describe all possible side effects. Call your doctor for medical advice about side effects. You may  report side effects to FDA at 1-800-FDA-1088. Where should I keep my medicine? This drug is given in a hospital or clinic and will not be stored at home. NOTE: This sheet is a summary. It may not cover all possible information. If you have questions about this medicine, talk to your doctor, pharmacist, or health care provider.  2019 Elsevier/Gold Standard (2015-01-24 10:11:26)

## 2018-06-09 NOTE — Patient Instructions (Signed)
Your cancer has spread to your left lung, and based on the fluid we tested from your lungs, it was positive for breast cancer.  We are still working on figuring out the extent that the breast cancer has spread.  This is what we are waiting on:  1.  PET scan to be scheduled so we can determine extent of everything  2. Caris testing to determine other treatment options that can be explored  Our goal is for [place] to control the cancer from getting worse, and to improve your quality of life with treatment.   I will refer you to our dietitian to talk about your weight loss.       PET Scan A PET scan (positron emission tomography) is a test that creates pictures of the inside of your body. For the test, a small amount of radioactive material is injected into a vein. A special scanner then takes pictures of your body. The pictures created during a PET scan can be used to study diseases, like cancer. The colors and brightness on the pictures show different levels of organ and tissue function. For example, cancer tissue appears brighter than normal tissue on a PET scan image. Tell a health care provider about:  Any allergies you have.  All medicines you are taking, including vitamins, herbs, eye drops, creams, and over-the-counter medicines.  Any blood disorders you have.  Any surgeries you have had.  Any medical conditions you have.  If you are afraid of cramped spaces (claustrophobic). If claustrophobia is a problem, it usually can be relieved with a medicine to help you relax (sedative) or a medicine to treat anxiety.  If you have trouble staying still for long periods of time.  Whether you are pregnant or may be pregnant. What are the risks? Generally, this is a safe test. However, problems may occur, including:  Bleeding, pain, or swelling at the injection site.  Allergic reactions to the radioactive material. This is rare. What happens before the procedure?  Do not eat or drink  anything after midnight on the night before the procedure, or as directed by your health care provider.  Take medicines only as directed by your health care provider.  Tell your health care provider if you are pregnant or breastfeeding.  If you have diabetes, ask your health care provider for diet guidelines to control your blood sugar (glucose) levels on the day of the test. What happens during the procedure?  An IV will be inserted into one of your veins.  A small amount of radioactive material will be injected into a vein.  You will wait 30-60 minutes after the injection. This allows the material to travel through your body.  You will lie on a cushioned table, and the table will be moved through the center of an imaging machine similar to a CT scanner.  Pictures of your body will be taken. It will take about 30-60 minutes for the machine to produce the pictures. You will need to stay very still during this time. What happens after the procedure?   Ask your health care provider, or the department that is doing the test: ? When will my results be ready? ? How will I get my results? ? What are my treatment options? ? What other tests do I need? ? What are my next steps?  You may resume your normal diet and activities.  Drink 6-8 glasses of water after the test to flush the radioactive material out of your body.  Drink enough fluid to keep your urine pale yellow. Summary  A PET scan is a test that creates pictures of the inside of the body. PET stands for positron emission tomography.  For this test, a small dose of a harmless radioactive material is injected into a vein. It will travel through your body in 30-60 minutes.  While lying down and staying very still, you will be moved through a machine that takes pictures of your body. This will take 30-60 minutes.  The colors and brightness on the pictures show different levels of organ and tissue function. For example, cancer  tissue appears brighter than normal tissue on a PET scan image. This information is not intended to replace advice given to you by your health care provider. Make sure you discuss any questions you have with your health care provider. Document Released: 06/28/2002 Document Revised: 01/13/2017 Document Reviewed: 01/13/2017 Elsevier Interactive Patient Education  2019 Elsevier Inc.  Eribulin solution for injection What is this medicine? ERIBULIN (er e bu lin) is a chemotherapy drug. It is used to treat breast cancer and liposarcoma. This medicine may be used for other purposes; ask your health care provider or pharmacist if you have questions. COMMON BRAND NAME(S): Halaven What should I tell my health care provider before I take this medicine? They need to know if you have any of these conditions: -heart disease -history of irregular heartbeat -kidney disease -liver disease -low blood counts, like low white cell, platelet, or red cell counts -low levels of potassium or magnesium in the blood -an unusual or allergic reaction to eribulin, other medicines, foods, dyes, or preservatives -pregnant or trying to get pregnant -breast-feeding How should I use this medicine? This medicine is for infusion into a vein. It is given by a health care professional in a hospital or clinic setting. Talk to your pediatrician regarding the use of this medicine in children. Special care may be needed. Overdosage: If you think you have taken too much of this medicine contact a poison control center or emergency room at once. NOTE: This medicine is only for you. Do not share this medicine with others. What if I miss a dose? It is important not to miss your dose. Call your doctor or health care professional if you are unable to keep an appointment. What may interact with this medicine? Do not take this medicine with any of the following medications: -amiodarone -astemizole -arsenic  trioxide -bepridil -bretylium -chloroquine -chlorpromazine -cisapride -clarithromycin -dextromethorphan, quinidine -disopyramide -dofetilide -droperidol -dronedarone -erythromycin -grepafloxacin -halofantrine -haloperidol -ibutilide -levomethadyl -mesoridazine -methadone -pentamidine -procainamide -quinidine -pimozide -posaconazole -probucol -propafenone -saquinavir -sotalol -sparfloxacin -terfenadine -thioridazine -troleandomycin -ziprasidone This list may not describe all possible interactions. Give your health care provider a list of all the medicines, herbs, non-prescription drugs, or dietary supplements you use. Also tell them if you smoke, drink alcohol, or use illegal drugs. Some items may interact with your medicine. What should I watch for while using this medicine? This drug may make you feel generally unwell. This is not uncommon, as chemotherapy can affect healthy cells as well as cancer cells. Report any side effects. Continue your course of treatment even though you feel ill unless your doctor tells you to stop. Call your doctor or health care professional for advice if you get a fever, chills or sore throat, or other symptoms of a cold or flu. Do not treat yourself. This drug decreases your body's ability to fight infections. Try to avoid being around people who are sick. This  medicine may increase your risk to bruise or bleed. Call your doctor or health care professional if you notice any unusual bleeding. You may need blood work done while you are taking this medicine. Do not become pregnant while taking this medicine or for 2 weeks after stopping it. Women should inform their doctor if they wish to become pregnant or think they might be pregnant. Men should not father a child while taking this medicine and for 3.5 months after stopping it. There is a potential for serious side effects to an unborn child. Talk to your health care professional or pharmacist for  more information. Do not breast-feed an infant while taking this medicine or for 2 weeks after stopping it. What side effects may I notice from receiving this medicine? Side effects that you should report to your doctor or health care professional as soon as possible: -allergic reactions like skin rash, itching or hives, swelling of the face, lips, or tongue -low blood counts - this medicine may decrease the number of white blood cells, red blood cells and platelets. You may be at increased risk for infections and bleeding. -signs of infection - fever or chills, cough, sore throat, pain or difficulty passing urine -signs of decreased platelets or bleeding - bruising, pinpoint red spots on the skin, black, tarry stools, blood in the urine -signs of decreased red blood cells - unusually weak or tired, fainting spells, lightheadedness -pain, tingling, numbness in the hands or feet Side effects that usually do not require medical attention (report to your doctor or health care professional if they continue or are bothersome): -constipation -hair loss -headache -loss of appetite -muscle or joint pain -nausea, vomiting -stomach pain This list may not describe all possible side effects. Call your doctor for medical advice about side effects. You may report side effects to FDA at 1-800-FDA-1088. Where should I keep my medicine? This drug is given in a hospital or clinic and will not be stored at home. NOTE: This sheet is a summary. It may not cover all possible information. If you have questions about this medicine, talk to your doctor, pharmacist, or health care provider.  2019 Elsevier/Gold Standard (2015-01-24 10:11:26)

## 2018-06-14 ENCOUNTER — Encounter (HOSPITAL_COMMUNITY): Payer: Self-pay | Admitting: Hematology and Oncology

## 2018-06-14 ENCOUNTER — Telehealth: Payer: Self-pay | Admitting: *Deleted

## 2018-06-14 NOTE — Telephone Encounter (Signed)
Received call from pt husband Jeneen Rinks stating that pt only had one plurX drainage bottle and needed to order more.  I informed pt husband that home heath would be the ones to order more bottles and pt husband stated that home health never came out to the home and had no contact information for them.  RN called [place] [facility] home health and they stated they attempted x3 to go to pt home and pt and family refused to allow them into the home and they are no longer under their care.  RN placed call to [employer] regarding bottles being delivered and they stated they were shipped Friday 6/5 and should arrive today.  Pt husband Jeneen Rinks notified that the shipment of bottles should arrive today and if they don't he should give [employer] a call ([phone]).  Phone number provided and pt husband verbalized understanding.

## 2018-06-15 ENCOUNTER — Other Ambulatory Visit: Payer: Self-pay

## 2018-06-15 ENCOUNTER — Encounter (HOSPITAL_COMMUNITY)
Admission: RE | Admit: 2018-06-15 | Discharge: 2018-06-15 | Disposition: A | Discharge status: Home / Self Care | Payer: Medicare Other | Source: Ambulatory Visit | Attending: Hematology and Oncology | Admitting: Hematology and Oncology

## 2018-06-15 DIAGNOSIS — Z171 Estrogen receptor negative status [ER-]: Secondary | ICD-10-CM | POA: Diagnosis present

## 2018-06-15 DIAGNOSIS — J90 Pleural effusion, not elsewhere classified: Secondary | ICD-10-CM | POA: Diagnosis not present

## 2018-06-15 DIAGNOSIS — C50412 Malignant neoplasm of upper-outer quadrant of left female breast: Secondary | ICD-10-CM | POA: Diagnosis present

## 2018-06-15 DIAGNOSIS — I714 Abdominal aortic aneurysm, without rupture: Secondary | ICD-10-CM | POA: Insufficient documentation

## 2018-06-15 DIAGNOSIS — I712 Thoracic aortic aneurysm, without rupture: Secondary | ICD-10-CM | POA: Diagnosis not present

## 2018-06-15 LAB — GLUCOSE, CAPILLARY: Glucose-Capillary: 116 mg/dL — ABNORMAL HIGH (ref 70–99)

## 2018-06-15 MED ORDER — FLUDEOXYGLUCOSE F - 18 (FDG) INJECTION
9.1000 | Freq: Once | INTRAVENOUS | Status: AC | PRN
  Administered 2018-06-15: 9.1 via INTRAVENOUS

## 2018-06-16 ENCOUNTER — Telehealth: Payer: Self-pay | Admitting: *Deleted

## 2018-06-16 ENCOUNTER — Other Ambulatory Visit: Payer: Medicare Other

## 2018-06-16 ENCOUNTER — Ambulatory Visit: Payer: Medicare Other

## 2018-06-16 ENCOUNTER — Ambulatory Visit: Payer: Medicare Other | Admitting: Hematology and Oncology

## 2018-06-16 NOTE — Assessment & Plan Note (Deleted)
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  Patient received neoadjuvant chemotherapy with dose dense Adriamycin and Cytoxan followed by Taxol and carboplatin followed by left mastectomy and axillary lymph node dissection 08/23/2015 followed by adjuvant radiation completed 01/08/2016  Metastatic breast cancer: 05/13/2018:Left pleural effusion with left upper lobe consolidation and paratracheal and AP window lymphadenopathy: Thoracentesis: Cytology, adenocarcinoma consistent with breast primary, positive for GA TA-3 and CK7, negative for CK20 and TTF-1, ER PR HER-2 negative.  Treatment plan: Palliative chemotherapy with Halaven days 1 and 8 every 3 weeks today cycle 1 day 8  Caris molecular testing: PDL 1 insufficient tissue, no other actionable mutations PET CT scan: 2/50/5397: Hypermetabolic lymph nodes right thoracic inlet, mediastinum both hilar regions, left upper lobe mass hypermetabolic, pleural nodule, pleural activity, opacities peripheral right lung.  Our plan is to continue with the current chemotherapy and rescan after 3 cycles

## 2018-06-16 NOTE — Telephone Encounter (Signed)
RN placed call to pts spouse Jeneen Rinks regarding missed apt today.  Per Jeneen Rinks, pt did not want to come in because she felt she was dehydrated.  Educated Jeneen Rinks that pt should come in, even when she is not feeling well.  Dr. Lindi Adie can assess her and look at lab work and administer the treatment she would need like IV fluids.  Jeneen Rinks verbalized understanding and asked if pt can be seen tomorrow 6/12.  High priority message sent to scheduling and RN placed call to pt spouse Jeneen Rinks to inform him that pts apts will start at 11:45 tomorrow 6/12.  Husband verbalized understanding and appreciative of call.

## 2018-06-16 NOTE — Progress Notes (Signed)
Patient Care Team: [name], MD as PCP - General (Internal Medicine) Nicholas Lose, MD as Consulting Physician (Hematology and Oncology) Alphonsa Overall, MD as Consulting Physician (General Surgery) Delice Bison, Charlestine Massed, NP as Nurse Practitioner (Hematology and Oncology) Kyung Rudd, MD as Consulting Physician (Radiation Oncology)  DIAGNOSIS:    ICD-10-CM   1. Malignant neoplasm of upper-outer quadrant of left breast in female, estrogen receptor negative ([place])  C50.412    Z17.1     SUMMARY OF ONCOLOGIC HISTORY: Oncology History  Breast cancer of upper-outer quadrant of left female breast ([place])  12/19/2014 Mammogram   Left breast palpable mass at 2:00: 4 x 3.8 x 2.5 cm, enlarged left axillary lymph node 4 cm in size with diffuse skin thickening [place] 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   12/26/2014 Initial Diagnosis   Left breast biopsy: Invasive ductal carcinoma with lymphovascular invasion, 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, grade 3, ER/PR negative, Ki-67 80% HER-2 Neg   01/18/2015 - 06/21/2015 Neo-Adjuvant Chemotherapy   Dose dense Adriamycin and Cytoxan 4 followed by Taxol and carboplatin [id number]   07/03/2015 Breast MRI   Improvement in multiple areas of enhancement largest area 3.4 cm other suspicious nodules anterior to the mass also improved, left axillary internal mammary lymph nodes improved   08/23/2015 Surgery   Left mastectomy with axillary lymph node dissection: Invasive ductal carcinoma with calcifications grade 3, 1.3 cm, image, margins negative, 1/6 lymph nodes positive with extracapsular extension, ER 0%, 0%, T1cN1a stage II a    10/31/2015 - 01/08/2016 Radiation Therapy   Adj XRT Lisbeth Renshaw): Left Chest Wall and Left SCLV treated to 50.4 Gy in 28 fractions of 1.8 Gy. Left Chest Wall was then boosted to 60.4 Gy in 5 fractions of 2 Gy.   02/03/2016 - 08/03/2016 Chemotherapy   Xeloda 1500 mg by mouth twice a day 2 weeks  on one week off   02/04/2017 - 02/10/2017 Hospital Admission   Admitted for bilateral pneumonia   05/13/2018 Relapse/Recurrence   Left pleural effusion with left upper lobe consolidation and paratracheal and AP window lymphadenopathy: Thoracentesis: Cytology, adenocarcinoma consistent with breast primary, positive for GA TA-3 and CK7, negative for CK20 and TTF-1, ER PR HER-2 negative   06/01/2018 - 06/03/2018 Hospital Admission   Admitted for chest pain and shortness of breath status post thoracentesis 2.7 L removed and Pleurx catheter was placed cytology positive for adenocarcinoma   06/09/2018 -  Chemotherapy   The patient had eriBULin mesylate (HALAVEN) 2.8 mg in sodium chloride 0.9 % 100 mL chemo infusion, 1.4 mg/m2 = 2.8 mg, Intravenous,  Once, 1 of 4 cycles Administration: 2.8 mg (06/09/2018)  for chemotherapy treatment.    06/16/2018 Miscellaneous   Caris molecular testing: MMR proficient, TMB indeterminate, triple negative, ER negative, insufficient material for PDL 1, PTEN IHC positive mutation not detected     CHIEF COMPLIANT: Cycle 1 day 8 Halaven  INTERVAL HISTORY: Deborah Mcintyre is a 63 y.o. with above-mentioned history of metastatic breast cancer with recurrent malignant pleural effusions. Pet scan on 06/15/18 showed nodal metastases in the right thoracic inlet, mediastinum, and both hilar regions, a dominant left upper lobe mass, involvement of the pleura, and ill-defined nodular opacities in the peripheral right lung. She presents to the clinic today for cycle 1 day 8 of Halaven.  She has poor appetite and has been losing weight. Her breathing has been much better since she had the Pleurx catheter.  She tells me  that not much fluid is draining out.  REVIEW OF SYSTEMS:   Constitutional: Denies fevers, chills or abnormal weight loss Eyes: Denies blurriness of vision Ears, nose, mouth, throat, and face: Denies mucositis or sore throat Respiratory: Pleurx catheter being drained  Cardiovascular: Denies palpitation, chest discomfort Gastrointestinal: Denies nausea, heartburn or change in bowel habits Skin: Denies abnormal skin rashes Lymphatics: Denies new lymphadenopathy or easy bruising Neurological: Denies numbness, tingling or new weaknesses Behavioral/Psych: Mood is stable, no new changes  Extremities: No lower extremity edema Breast: denies any pain or lumps or nodules in either breasts All other systems were reviewed with the patient and are negative.  I have reviewed the past medical history, past surgical history, social history and family history with the patient and they are unchanged from previous note.  ALLERGIES:  has No Known Allergies.  MEDICATIONS:  Current Outpatient Medications  Medication Sig Dispense Refill  . albuterol (PROVENTIL HFA;VENTOLIN HFA) 108 (90 Base) MCG/ACT inhaler Inhale 1-2 puffs into the lungs every 6 (six) hours as needed for wheezing or shortness of breath.    [name] albuterol (PROVENTIL) (2.5 MG/3ML) 0.083% nebulizer solution Take 3 mLs (2.5 mg total) by nebulization every 6 (six) hours as needed for wheezing or shortness of breath. 75 mL 0  . ALPRAZolam (XANAX) 0.5 MG tablet Take 1 tablet (0.5 mg total) by mouth 3 (three) times daily as needed for anxiety or sleep. 30 tablet 0  . amLODipine (NORVASC) 5 MG tablet Take 1 tablet (5 mg total) by mouth daily. 30 tablet 0  . aspirin EC 81 MG tablet Take 81 mg by mouth daily.    [name] dexamethasone (DECADRON) 1 MG tablet Take 1 tablet (1 mg total) by mouth daily. 30 tablet 3  . gabapentin (NEURONTIN) 300 MG capsule Take 1 capsule (300 mg total) by mouth 3 (three) times daily. 90 capsule 6  . lidocaine-prilocaine (EMLA) cream Apply to affected area once (Patient taking differently: Apply 1 application topically daily as needed. Port) 30 g 3  . metoprolol tartrate (LOPRESSOR) 25 MG tablet Take 1 tablet (25 mg total) by mouth 2 (two) times daily. 60 tablet 0  . ondansetron (ZOFRAN) 8 MG tablet  Take 1 tablet (8 mg total) by mouth every 8 (eight) hours as needed for nausea. 30 tablet 3  . oxyCODONE-acetaminophen (PERCOCET/ROXICET) 5-325 MG tablet Take 1 tablet by mouth every 8 (eight) hours as needed for moderate pain or severe pain (1 tab every 4-6 hour prn pain). 90 tablet 0  . PARoxetine (PAXIL) 20 MG tablet Take 1 tablet (20 mg total) by mouth 2 (two) times daily. 30 tablet 0  . PREMARIN vaginal cream INSERT 1 APPLICATORFUL VAGINALLY DAILY (Patient taking differently: Place 1 Applicatorful vaginally daily. ) 30 g 12  . prochlorperazine (COMPAZINE) 10 MG tablet Take 1 tablet (10 mg total) by mouth every 6 (six) hours as needed (Nausea or vomiting). 30 tablet 1   No current facility-administered medications for this visit.    Facility-Administered Medications Ordered in Other Visits  Medication Dose Route Frequency Provider Last Rate Last Dose  . 0.9 %  sodium chloride infusion   Intravenous Once Nicholas Lose, MD      . heparin lock flush 100 unit/mL  500 Units Intracatheter Once PRN Nicholas Lose, MD      . sodium chloride 0.9 % injection 10 mL  10 mL Intravenous PRN Nicholas Lose, MD   10 mL at 05/25/16 1440  . sodium chloride 0.9 % injection 10 mL  10 mL Intracatheter PRN Nicholas Lose, MD        PHYSICAL EXAMINATION: ECOG PERFORMANCE STATUS: 1 - Symptomatic but completely ambulatory  Vitals:   06/17/18 1214  BP: (!) 145/88  Pulse: (!) 122  Resp: 18  Temp: 97.8 F (36.6 C)  SpO2: 100%   Filed Weights   06/17/18 1214  Weight: 173 lb (78.5 kg)    GENERAL: alert, no distress and comfortable SKIN: skin color, texture, turgor are normal, no rashes or significant lesions EYES: normal, Conjunctiva are pink and non-injected, sclera clear OROPHARYNX: no exudate, no erythema and lips, buccal mucosa, and tongue normal  NECK: supple, thyroid normal size, non-tender, without nodularity LYMPH: no palpable lymphadenopathy in the cervical, axillary or inguinal LUNGS: clear to  auscultation and percussion with normal breathing effort HEART: regular rate & rhythm and no murmurs and no lower extremity edema ABDOMEN: abdomen soft, non-tender and normal bowel sounds MUSCULOSKELETAL: no cyanosis of digits and no clubbing  NEURO: alert & oriented x 3 with fluent speech, no focal motor/sensory deficits EXTREMITIES: No lower extremity edema  LABORATORY DATA:  I have reviewed the data as listed CMP Latest Ref Rng & Units 06/17/2018 06/09/2018 06/02/2018  Glucose 70 - 99 mg/dL 108(H) 88 92  BUN 8 - 23 mg/dL 5(L) 8 7(L)  Creatinine 0.44 - 1.00 mg/dL 0.75 0.80 0.66  Sodium 135 - 145 mmol/L 142 140 139  Potassium 3.5 - 5.1 mmol/L 3.2(L) 3.7 3.7  Chloride 98 - 111 mmol/L 103 103 102  CO2 22 - 32 mmol/L _0 Calcium 8.9 - 10.3 mg/dL 8.9 8.9 8.7(L)  Total Protein 6.5 - 8.1 g/dL 6.4(L) 6.4(L) 6.1(L)  Total Bilirubin 0.3 - 1.2 mg/dL 0.4 0.4 0.5  Alkaline Phos 38 - 126 U/L 59 56 50  AST 15 - 41 U/L 12(L) 12(L) 11(L)  ALT 0 - 44 U/L 7 <6 8    Lab Results  Component Value Date   WBC 2.0 (L) 06/17/2018   HGB 13.5 06/17/2018   HCT 42.9 06/17/2018   MCV 93.3 06/17/2018   PLT 342 06/17/2018   NEUTROABS 0.7 (L) 06/17/2018    ASSESSMENT & PLAN:  Breast cancer of upper-outer quadrant of left female breast ([place]) 12/26/14: inflammatory breast cancerInvasive ductal carcinoma with lymphovascular invasion, 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  Patient received neoadjuvant chemotherapy with dose dense Adriamycin and Cytoxan followed by Taxol and carboplatin followed by left mastectomy and axillary lymph node dissection 08/23/2015 followed by adjuvant radiation completed 01/08/2016  Metastatic breast cancer: 05/13/2018:Left pleural effusion with left upper lobe  consolidation and paratracheal and AP window lymphadenopathy: Thoracentesis: Cytology, adenocarcinoma consistent with breast primary, positive for GA TA-3 and CK7, negative for CK20 and TTF-1, ER PR HER-2 negative.  Treatment plan: Palliative chemotherapy with Halaven changed to every 2 weeks Lab review: ANC 0.7.  The because of this we are not able to give chemo today. She will return next week to receive her chemo and after that she will get the treatment every 2 weeks.  Caris molecular testing: PDL 1 insufficient tissue, no other actionable mutations PET CT scan: 06/28/7626: Hypermetabolic lymph nodes right thoracic inlet, mediastinum both hilar regions, left upper lobe mass hypermetabolic, pleural nodule, pleural activity, opacities peripheral right lung.  I  renewed the prescription for pain medication. I sent a prescription for dexamethasone 1 mg daily to improve her appetite.  Our plan is to continue with the current chemotherapy and rescan after a total of 6 doses.    No orders of the defined types were placed in this encounter.  The patient has a good understanding of the overall plan. she agrees with it. she will call with any problems that may develop before the next visit here.  Nicholas Lose, MD 06/17/2018  Julious Oka Dorshimer am acting as scribe for Dr. Nicholas Lose.  I have reviewed the above documentation for accuracy and completeness, and I agree with the above.

## 2018-06-17 ENCOUNTER — Inpatient Hospital Stay (HOSPITAL_BASED_OUTPATIENT_CLINIC_OR_DEPARTMENT_OTHER): Payer: Medicare Other | Admitting: Hematology and Oncology

## 2018-06-17 ENCOUNTER — Inpatient Hospital Stay: Payer: Medicare Other

## 2018-06-17 ENCOUNTER — Other Ambulatory Visit: Payer: Self-pay

## 2018-06-17 VITALS — BP 135/88 | HR 94

## 2018-06-17 DIAGNOSIS — Z5111 Encounter for antineoplastic chemotherapy: Secondary | ICD-10-CM

## 2018-06-17 DIAGNOSIS — F329 Major depressive disorder, single episode, unspecified: Secondary | ICD-10-CM

## 2018-06-17 DIAGNOSIS — Z171 Estrogen receptor negative status [ER-]: Secondary | ICD-10-CM

## 2018-06-17 DIAGNOSIS — R634 Abnormal weight loss: Secondary | ICD-10-CM

## 2018-06-17 DIAGNOSIS — C50412 Malignant neoplasm of upper-outer quadrant of left female breast: Secondary | ICD-10-CM

## 2018-06-17 DIAGNOSIS — I10 Essential (primary) hypertension: Secondary | ICD-10-CM

## 2018-06-17 DIAGNOSIS — Z803 Family history of malignant neoplasm of breast: Secondary | ICD-10-CM

## 2018-06-17 DIAGNOSIS — C773 Secondary and unspecified malignant neoplasm of axilla and upper limb lymph nodes: Secondary | ICD-10-CM

## 2018-06-17 DIAGNOSIS — J90 Pleural effusion, not elsewhere classified: Secondary | ICD-10-CM

## 2018-06-17 DIAGNOSIS — F17210 Nicotine dependence, cigarettes, uncomplicated: Secondary | ICD-10-CM

## 2018-06-17 DIAGNOSIS — J449 Chronic obstructive pulmonary disease, unspecified: Secondary | ICD-10-CM

## 2018-06-17 DIAGNOSIS — Z7189 Other specified counseling: Secondary | ICD-10-CM

## 2018-06-17 DIAGNOSIS — Z923 Personal history of irradiation: Secondary | ICD-10-CM

## 2018-06-17 DIAGNOSIS — Z79899 Other long term (current) drug therapy: Secondary | ICD-10-CM

## 2018-06-17 DIAGNOSIS — Z7982 Long term (current) use of aspirin: Secondary | ICD-10-CM

## 2018-06-17 DIAGNOSIS — Z9012 Acquired absence of left breast and nipple: Secondary | ICD-10-CM

## 2018-06-17 DIAGNOSIS — Z170 Estrogen receptor positive status [ER+]: Secondary | ICD-10-CM

## 2018-06-17 DIAGNOSIS — R630 Anorexia: Secondary | ICD-10-CM

## 2018-06-17 LAB — CBC WITH DIFFERENTIAL (CANCER CENTER ONLY)
Abs Immature Granulocytes: 0.02 10*3/uL (ref 0.00–0.07)
Basophils Absolute: 0 10*3/uL (ref 0.0–0.1)
Basophils Relative: 2 %
Eosinophils Absolute: 0 10*3/uL (ref 0.0–0.5)
Eosinophils Relative: 1 %
HCT: 42.9 % (ref 36.0–46.0)
Hemoglobin: 13.5 g/dL (ref 12.0–15.0)
Immature Granulocytes: 1 %
Lymphocytes Relative: 47 %
Lymphs Abs: 1 10*3/uL (ref 0.7–4.0)
MCH: 29.3 pg (ref 26.0–34.0)
MCHC: 31.5 g/dL (ref 30.0–36.0)
MCV: 93.3 fL (ref 80.0–100.0)
Monocytes Absolute: 0.3 10*3/uL (ref 0.1–1.0)
Monocytes Relative: 13 %
Neutro Abs: 0.7 10*3/uL — ABNORMAL LOW (ref 1.7–7.7)
Neutrophils Relative %: 36 %
Platelet Count: 342 10*3/uL (ref 150–400)
RBC: 4.6 MIL/uL (ref 3.87–5.11)
RDW: 12.5 % (ref 11.5–15.5)
WBC Count: 2 10*3/uL — ABNORMAL LOW (ref 4.0–10.5)
nRBC: 0 % (ref 0.0–0.2)

## 2018-06-17 LAB — CMP (CANCER CENTER ONLY)
ALT: 7 U/L (ref 0–44)
AST: 12 U/L — ABNORMAL LOW (ref 15–41)
Albumin: 3.3 g/dL — ABNORMAL LOW (ref 3.5–5.0)
Alkaline Phosphatase: 59 U/L (ref 38–126)
Anion gap: 12 (ref 5–15)
BUN: 5 mg/dL — ABNORMAL LOW (ref 8–23)
CO2: 27 mmol/L (ref 22–32)
Calcium: 8.9 mg/dL (ref 8.9–10.3)
Chloride: 103 mmol/L (ref 98–111)
Creatinine: 0.75 mg/dL (ref 0.44–1.00)
GFR, Est AFR Am: >60 mL/min (ref >60)
GFR, Estimated: >60 mL/min (ref >60)
Glucose, Bld: 108 mg/dL — ABNORMAL HIGH (ref 70–99)
Potassium: 3.2 mmol/L — ABNORMAL LOW (ref 3.5–5.1)
Sodium: 142 mmol/L (ref 135–145)
Total Bilirubin: 0.4 mg/dL (ref 0.3–1.2)
Total Protein: 6.4 g/dL — ABNORMAL LOW (ref 6.5–8.1)

## 2018-06-17 MED ORDER — OXYCODONE-ACETAMINOPHEN 5-325 MG PO TABS: 1.0000 | ORAL_TABLET | Freq: Three times a day (TID) | ORAL | 0 refills | Status: DC | PRN

## 2018-06-17 MED ORDER — SODIUM CHLORIDE 0.9 % IV SOLN
Freq: Once | INTRAVENOUS | Status: AC
  Administered 2018-06-17: 13:00:00 via INTRAVENOUS
  Filled 2018-06-17: qty 250

## 2018-06-17 MED ORDER — SODIUM CHLORIDE 0.9% FLUSH
10.0000 mL | Freq: Once | INTRAVENOUS | Status: DC
  Filled 2018-06-17: qty 10

## 2018-06-17 MED ORDER — SODIUM CHLORIDE 0.9% FLUSH
10.0000 mL | INTRAVENOUS | Status: DC | PRN
  Administered 2018-06-17: 15:00:00 10 mL
  Filled 2018-06-17: qty 10

## 2018-06-17 MED ORDER — HEPARIN SOD (PORK) LOCK FLUSH 100 UNIT/ML IV SOLN
500.0000 [IU] | Freq: Once | INTRAVENOUS | Status: AC | PRN
  Administered 2018-06-17: 500 [IU]
  Filled 2018-06-17: qty 5

## 2018-06-17 MED ORDER — SODIUM CHLORIDE 0.9 % IJ SOLN: 10.0000 mL | INTRAMUSCULAR | Status: DC | PRN

## 2018-06-17 MED ORDER — DEXAMETHASONE 1 MG PO TABS: 1.0000 mg | ORAL_TABLET | Freq: Every day | ORAL | 3 refills | Status: DC

## 2018-06-17 NOTE — Assessment & Plan Note (Addendum)
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  Patient received neoadjuvant chemotherapy with dose dense Adriamycin and Cytoxan followed by Taxol and carboplatin followed by left mastectomy and axillary lymph node dissection 08/23/2015 followed by adjuvant radiation completed 01/08/2016  Metastatic breast cancer: 05/13/2018:Left pleural effusion with left upper lobe consolidation and paratracheal and AP window lymphadenopathy: Thoracentesis: Cytology, adenocarcinoma consistent with breast primary, positive for GA TA-3 and CK7, negative for CK20 and TTF-1, ER PR HER-2 negative.  Treatment plan: Palliative chemotherapy with Halaven changed to every 2 weeks Lab review: ANC 0.7.  The because of this we are not able to give chemo today. She will return next week to receive her chemo and after that she will get the treatment every 2 weeks.  [facility] molecular testing: PDL 1 insufficient tissue, no other actionable mutations PET CT scan: 9/47/1252: Hypermetabolic lymph nodes right thoracic inlet, mediastinum both hilar regions, left upper lobe mass hypermetabolic, pleural nodule, pleural activity, opacities peripheral right lung.  I renewed the prescription for pain medication. I sent a prescription for dexamethasone 1 mg daily to improve her appetite.  Our plan is to continue with the current chemotherapy and rescan after a total of 6 doses.

## 2018-06-17 NOTE — Patient Instructions (Signed)
Rehydration, Adult Rehydration is the replacement of body fluids and salts and minerals (electrolytes) that are lost during dehydration. Dehydration is when there is not enough fluid or water in the body. This happens when you lose more fluids than you take in. Common causes of dehydration include:  Vomiting.  Diarrhea.  Excessive sweating, such as from heat exposure or exercise.  Taking medicines that cause the body to lose excess fluid (diuretics).  Impaired kidney function.  Not drinking enough fluid.  Certain illnesses or infections.  Certain poorly controlled long-term (chronic) illnesses, such as diabetes, heart disease, and kidney disease.  Symptoms of mild dehydration may include thirst, dry lips and mouth, dry skin, and dizziness. Symptoms of severe dehydration may include increased heart rate, confusion, fainting, and not urinating. You can rehydrate by drinking certain fluids or getting fluids through an IV tube, as told by your health care provider. What are the risks? Generally, rehydration is safe. However, one problem that can happen is taking in too much fluid (overhydration). This is rare. If overhydration happens, it can cause an electrolyte imbalance, kidney failure, or a decrease in salt (sodium) levels in the body. How to rehydrate Follow instructions from your health care provider for rehydration. The kind of fluid you should drink and the amount you should drink depend on your condition.  If directed by your health care provider, drink an oral rehydration solution (ORS). This is a drink designed to treat dehydration that is found in pharmacies and retail stores. ? Make an ORS by following instructions on the package. ? Start by drinking small amounts, about  cup (120 mL) every 5-10 minutes. ? Slowly increase how much you drink until you have taken the amount recommended by your health care provider.  Drink enough clear fluids to keep your urine clear or pale  yellow. If you were instructed to drink an ORS, finish the ORS first, then start slowly drinking other clear fluids. Drink fluids such as: ? Water. Do not drink only water. Doing that can lead to having too little sodium in your body (hyponatremia). ? Ice chips. ? Fruit juice that you have added water to (diluted juice). ? Low-calorie sports drinks.  If you are severely dehydrated, your health care provider may recommend that you receive fluids through an IV tube in the hospital.  Do not take sodium tablets. Doing that can lead to the condition of having too much sodium in your body (hypernatremia). Eating while you rehydrate Follow instructions from your health care provider about what to eat while you rehydrate. Your health care provider may recommend that you slowly begin eating regular foods in small amounts.  Eat foods that contain a healthy balance of electrolytes, such as bananas, oranges, potatoes, tomatoes, and spinach.  Avoid foods that are greasy or contain a lot of fat or sugar.  In some cases, you may get nutrition through a feeding tube that is passed through your nose and into your stomach (nasogastric tube, or NG tube). This may be done if you have uncontrolled vomiting or diarrhea. Beverages to avoid Certain beverages may make dehydration worse. While you rehydrate, avoid:  Alcohol.  Caffeine.  Drinks that contain a lot of sugar. These include: ? High-calorie sports drinks. ? Fruit juice that is not diluted. ? Soda.  Check nutrition labels to see how much sugar or caffeine a beverage contains. Signs of dehydration recovery You may be recovering from dehydration if:  You are urinating more often than before you started  rehydrating.  Your urine is clear or pale yellow.  Your energy level improves.  You vomit less frequently.  You have diarrhea less frequently.  Your appetite improves or returns to normal.  You feel less dizzy or less light-headed.  Your  skin tone and color start to look more normal. Contact a health care provider if:  You continue to have symptoms of mild dehydration, such as: ? Thirst. ? Dry lips. ? Slightly dry mouth. ? Dry, warm skin. ? Dizziness.  You continue to vomit or have diarrhea. Get help right away if:  You have symptoms of dehydration that get worse.  You feel: ? Confused. ? Weak. ? Like you are going to faint.  You have not urinated in 6-8 hours.  You have very dark urine.  You have trouble breathing.  Your heart rate while sitting still is over 100 beats a minute.  You cannot drink fluids without vomiting.  You have vomiting or diarrhea that: ? Gets worse. ? Does not go away.  You have a fever. This information is not intended to replace advice given to you by your health care provider. Make sure you discuss any questions you have with your health care provider. Document Released: 03/16/2011 Document Revised: 07/12/2015 Document Reviewed: 02/15/2015 Elsevier Interactive Patient Education  2019 [place] (COVID-19) Are you at risk?  Are you at risk for the Coronavirus (COVID-19)?  To be considered HIGH RISK for Coronavirus (COVID-19), you have to meet the following criteria:  . Traveled to [place], [place], [place], [place] or [place]; or in the [place] to [place], [place], [place], or [place]; and have fever, cough, and shortness of breath within the last 2 weeks of travel OR . Been in close contact with a person diagnosed with COVID-19 within the last 2 weeks and have fever, cough, and shortness of breath . IF YOU DO NOT MEET THESE CRITERIA, YOU ARE CONSIDERED LOW RISK FOR COVID-19.  What to do if you are HIGH RISK for COVID-19?  [name] If you are having a medical emergency, call 911. . Seek medical care right away. Before you go to a doctor's office, urgent care or emergency department, call ahead and tell them about your recent travel, contact with  someone diagnosed with COVID-19, and your symptoms. You should receive instructions from your physician's office regarding next steps of care.  . When you arrive at healthcare provider, tell the healthcare staff immediately you have returned from visiting [place], [place], [place], [place] or [place]; or traveled in the [place] to [place], [place], [place], or [place]; in the last two weeks or you have been in close contact with a person diagnosed with COVID-19 in the last 2 weeks.   . Tell the health care staff about your symptoms: fever, cough and shortness of breath. . After you have been seen by a medical provider, you will be either: o Tested for (COVID-19) and discharged home on quarantine except to seek medical care if symptoms worsen, and asked to  - Stay home and avoid contact with others until you get your results (4-5 days)  - Avoid travel on public transportation if possible (such as bus, train, or airplane) or o Sent to the Emergency Department by EMS for evaluation, COVID-19 testing, and possible admission depending on your condition and test results.  What to do if you are LOW RISK for COVID-19?  Reduce your risk of any infection by using the same  precautions used for avoiding the common cold or flu:  [name] Wash your hands often with soap and warm water for at least 20 seconds.  If soap and water are not readily available, use an alcohol-based hand sanitizer with at least 60% alcohol.  . If coughing or sneezing, cover your mouth and nose by coughing or sneezing into the elbow areas of your shirt or coat, into a tissue or into your sleeve (not your hands). . Avoid shaking hands with others and consider head nods or verbal greetings only. . Avoid touching your eyes, nose, or mouth with unwashed hands.  . Avoid close contact with people who are sick. . Avoid places or events with large numbers of people in one location, like concerts or sporting events. . Carefully consider  travel plans you have or are making. . If you are planning any travel outside or inside the [place], visit the CDC's Travelers' Health webpage for the latest health notices. . If you have some symptoms but not all symptoms, continue to monitor at home and seek medical attention if your symptoms worsen. . If you are having a medical emergency, call 911.   [place] / e-Visit: [email or web address]         [facility]: [facility]: [phone]                   [facility]: [phone]

## 2018-06-20 ENCOUNTER — Telehealth: Payer: Self-pay | Admitting: Hematology and Oncology

## 2018-06-20 NOTE — Telephone Encounter (Signed)
I talk with patient regarding schedule  

## 2018-06-23 ENCOUNTER — Ambulatory Visit: Payer: Medicare Other | Admitting: Hematology and Oncology

## 2018-06-23 ENCOUNTER — Other Ambulatory Visit: Payer: Medicare Other

## 2018-06-23 ENCOUNTER — Ambulatory Visit: Payer: Medicare Other

## 2018-06-23 NOTE — Progress Notes (Signed)
Patient Care Team: [name], MD as PCP - General (Internal Medicine) Nicholas Lose, MD as Consulting Physician (Hematology and Oncology) Alphonsa Overall, MD as Consulting Physician (General Surgery) Delice Bison, Charlestine Massed, NP as Nurse Practitioner (Hematology and Oncology) Kyung Rudd, MD as Consulting Physician (Radiation Oncology)  DIAGNOSIS:    ICD-10-CM   1. Malignant neoplasm of upper-outer quadrant of left breast in female, estrogen receptor negative ([place])  C50.412    Z17.1     SUMMARY OF ONCOLOGIC HISTORY: Oncology History  Breast cancer of upper-outer quadrant of left female breast ([place])  12/19/2014 Mammogram   Left breast palpable mass at 2:00: 4 x 3.8 x 2.5 cm, enlarged left axillary lymph node 4 cm in size with diffuse skin thickening [place] 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   12/26/2014 Initial Diagnosis   Left breast biopsy: Invasive ductal carcinoma with lymphovascular invasion, 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, grade 3, ER/PR negative, Ki-67 80% HER-2 Neg   01/18/2015 - 06/21/2015 Neo-Adjuvant Chemotherapy   Dose dense Adriamycin and Cytoxan 4 followed by Taxol and carboplatin [id number]   07/03/2015 Breast MRI   Improvement in multiple areas of enhancement largest area 3.4 cm other suspicious nodules anterior to the mass also improved, left axillary internal mammary lymph nodes improved   08/23/2015 Surgery   Left mastectomy with axillary lymph node dissection: Invasive ductal carcinoma with calcifications grade 3, 1.3 cm, image, margins negative, 1/6 lymph nodes positive with extracapsular extension, ER 0%, 0%, T1cN1a stage II a    10/31/2015 - 01/08/2016 Radiation Therapy   Adj XRT Lisbeth Renshaw): Left Chest Wall and Left SCLV treated to 50.4 Gy in 28 fractions of 1.8 Gy. Left Chest Wall was then boosted to 60.4 Gy in 5 fractions of 2 Gy.   02/03/2016 - 08/03/2016 Chemotherapy   Xeloda 1500 mg by mouth twice a day 2 weeks  on one week off   02/04/2017 - 02/10/2017 Hospital Admission   Admitted for bilateral pneumonia   05/13/2018 Relapse/Recurrence   Left pleural effusion with left upper lobe consolidation and paratracheal and AP window lymphadenopathy: Thoracentesis: Cytology, adenocarcinoma consistent with breast primary, positive for GA TA-3 and CK7, negative for CK20 and TTF-1, ER PR HER-2 negative   06/01/2018 - 06/03/2018 Hospital Admission   Admitted for chest pain and shortness of breath status post thoracentesis 2.7 L removed and Pleurx catheter was placed cytology positive for adenocarcinoma   06/09/2018 -  Chemotherapy   The patient had eriBULin mesylate (HALAVEN) 2.8 mg in sodium chloride 0.9 % 100 mL chemo infusion, 1.4 mg/m2 = 2.8 mg, Intravenous,  Once, 1 of 4 cycles Administration: 2.8 mg (06/09/2018)  for chemotherapy treatment.    06/16/2018 Miscellaneous   [facility] molecular testing: MMR proficient, TMB indeterminate, triple negative, ER negative, insufficient material for PDL 1, PTEN IHC positive mutation not detected     CHIEF COMPLIANT: Cycle 1 Day 15 Halaven  INTERVAL HISTORY: Deborah Mcintyre is a 63 y.o. with above-mentioned history of metastatic breast cancer with recurrent malignant pleural effusions who is currently on treatment with Halaven. She presents to the clinic today for treatment.  She is having difficulty with sleeping. Occasional nausea but no vomiting. Denies neuropathy. Chronic pain related to metastatic disease.  REVIEW OF SYSTEMS:   Constitutional: Denies fevers, chills or abnormal weight loss Eyes: Denies blurriness of vision Ears, nose, mouth, throat, and face: Denies mucositis or sore throat Respiratory: Denies cough, dyspnea or wheezes Cardiovascular: Denies palpitation, chest  discomfort Gastrointestinal: Denies nausea, heartburn or change in bowel habits Skin: Denies abnormal skin rashes Lymphatics: Denies new lymphadenopathy or easy bruising Neurological: Denies  numbness, tingling or new weaknesses Behavioral/Psych: Mood is stable, no new changes  Extremities: No lower extremity edema Breast: denies any pain or lumps or nodules in either breasts All other systems were reviewed with the patient and are negative.  I have reviewed the past medical history, past surgical history, social history and family history with the patient and they are unchanged from previous note.  ALLERGIES:  has No Known Allergies.  MEDICATIONS:  Current Outpatient Medications  Medication Sig Dispense Refill  . albuterol (PROVENTIL HFA;VENTOLIN HFA) 108 (90 Base) MCG/ACT inhaler Inhale 1-2 puffs into the lungs every 6 (six) hours as needed for wheezing or shortness of breath.    [name] albuterol (PROVENTIL) (2.5 MG/3ML) 0.083% nebulizer solution Take 3 mLs (2.5 mg total) by nebulization every 6 (six) hours as needed for wheezing or shortness of breath. 75 mL 0  . ALPRAZolam (XANAX) 0.5 MG tablet Take 1 tablet (0.5 mg total) by mouth 3 (three) times daily as needed for anxiety or sleep. 30 tablet 0  . amLODipine (NORVASC) 5 MG tablet Take 1 tablet (5 mg total) by mouth daily. 30 tablet 0  . aspirin EC 81 MG tablet Take 81 mg by mouth daily.    [name] dexamethasone (DECADRON) 1 MG tablet Take 1 tablet (1 mg total) by mouth daily. 30 tablet 3  . gabapentin (NEURONTIN) 300 MG capsule Take 1 capsule (300 mg total) by mouth 3 (three) times daily. 90 capsule 6  . lidocaine-prilocaine (EMLA) cream Apply to affected area once (Patient taking differently: Apply 1 application topically daily as needed. Port) 30 g 3  . metoprolol tartrate (LOPRESSOR) 25 MG tablet Take 1 tablet (25 mg total) by mouth 2 (two) times daily. 60 tablet 0  . ondansetron (ZOFRAN) 8 MG tablet Take 1 tablet (8 mg total) by mouth every 8 (eight) hours as needed for nausea. 30 tablet 3  . oxyCODONE-acetaminophen (PERCOCET/ROXICET) 5-325 MG tablet Take 1 tablet by mouth every 8 (eight) hours as needed for moderate pain or severe  pain (1 tab every 4-6 hour prn pain). 90 tablet 0  . PARoxetine (PAXIL) 20 MG tablet Take 1 tablet (20 mg total) by mouth 2 (two) times daily. 30 tablet 0  . PREMARIN vaginal cream INSERT 1 APPLICATORFUL VAGINALLY DAILY (Patient taking differently: Place 1 Applicatorful vaginally daily. ) 30 g 12  . prochlorperazine (COMPAZINE) 10 MG tablet Take 1 tablet (10 mg total) by mouth every 6 (six) hours as needed (Nausea or vomiting). 30 tablet 1  . zolpidem (AMBIEN CR) 6.25 MG CR tablet Take 1 tablet (6.25 mg total) by mouth at bedtime as needed for sleep. 30 tablet 0   No current facility-administered medications for this visit.    Facility-Administered Medications Ordered in Other Visits  Medication Dose Route Frequency Provider Last Rate Last Dose  . sodium chloride 0.9 % injection 10 mL  10 mL Intravenous PRN Nicholas Lose, MD   10 mL at 05/25/16 1440  . sodium chloride flush (NS) 0.9 % injection 10 mL  10 mL Intravenous PRN Nicholas Lose, MD        PHYSICAL EXAMINATION: ECOG PERFORMANCE STATUS: 1 - Symptomatic but completely ambulatory  Vitals:   06/24/18 1114  BP: 112/80  Pulse: 97  Resp: 17  Temp: 98.2 F (36.8 C)  SpO2: 98%   Filed Weights   06/24/18  1114  Weight: 171 lb 8 oz (77.8 kg)   Physical exam not done due to COVID-19 precautions  LABORATORY DATA:  I have reviewed the data as listed CMP Latest Ref Rng & Units 06/17/2018 06/09/2018 06/02/2018  Glucose 70 - 99 mg/dL 108(H) 88 92  BUN 8 - 23 mg/dL 5(L) 8 7(L)  Creatinine 0.44 - 1.00 mg/dL 0.75 0.80 0.66  Sodium 135 - 145 mmol/L 142 140 139  Potassium 3.5 - 5.1 mmol/L 3.2(L) 3.7 3.7  Chloride 98 - 111 mmol/L 103 103 102  CO2 22 - 32 mmol/L '27 27 31  ' Calcium 8.9 - 10.3 mg/dL 8.9 8.9 8.7(L)  Total Protein 6.5 - 8.1 g/dL 6.4(L) 6.4(L) 6.1(L)  Total Bilirubin 0.3 - 1.2 mg/dL 0.4 0.4 0.5  Alkaline Phos 38 - 126 U/L 59 56 50  AST 15 - 41 U/L 12(L) 12(L) 11(L)  ALT 0 - 44 U/L 7 <6 8    Lab Results  Component Value Date    WBC 4.9 06/24/2018   HGB 13.1 06/24/2018   HCT 41.7 06/24/2018   MCV 93.1 06/24/2018   PLT 372 06/24/2018   NEUTROABS 3.3 06/24/2018    ASSESSMENT & PLAN:  Breast cancer of upper-outer quadrant of left female breast ([place]) 12/26/14: inflammatory breast cancerInvasive ductal carcinoma with lymphovascular invasion, 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  Patient received neoadjuvant chemotherapy with dose dense Adriamycin and Cytoxan followed by Taxol and carboplatin followed by left mastectomy and axillary lymph node dissection 08/23/2015 followed by adjuvant radiation completed 01/08/2016  Metastatic breast cancer: 05/13/2018:Left pleural effusion with left upper lobe consolidation and paratracheal and AP window lymphadenopathy: Thoracentesis: Cytology, adenocarcinoma consistent with breast primary, positive for GA TA-3 and CK7, negative for CK20 and TTF-1, ER PR HER-2 negative.  [facility] molecular testing: PDL 1 insufficient tissue, no other actionable mutations PET CT scan: 06/01/7822: Hypermetabolic lymph nodes right thoracic inlet, mediastinum both hilar regions, left upper lobe mass hypermetabolic, pleural nodule, pleural activity, opacities peripheral right lung.  Current treatment: Palliative chemotherapy with Halaven every 2 weeks today cycle 1 day 15 Lab review: ANC today is 3.3.  Chemo toxicities: Overall she tolerated it fairly well except for neutropenia with ANC of 0.7 on cycle 1 day 8.  Because of this we change her treatment to every 2 weeks.  Difficulty with sleeping: I sent a prescription for Ambien CR.   No orders of the defined types were placed in this encounter.  The patient has a good understanding of the overall plan. she agrees with it. she will call with  any problems that may develop before the next visit here.  Rulon Eisenmenger, MD 06/24/2018  Julious Oka Dorshimer am acting as scribe for Dr. Nicholas Lose.  I have reviewed the above documentation for accuracy and completeness, and I agree with the above.

## 2018-06-24 ENCOUNTER — Inpatient Hospital Stay: Payer: Medicare Other

## 2018-06-24 ENCOUNTER — Inpatient Hospital Stay (HOSPITAL_BASED_OUTPATIENT_CLINIC_OR_DEPARTMENT_OTHER): Payer: Medicare Other | Admitting: Hematology and Oncology

## 2018-06-24 ENCOUNTER — Other Ambulatory Visit: Payer: Self-pay

## 2018-06-24 DIAGNOSIS — Z171 Estrogen receptor negative status [ER-]: Secondary | ICD-10-CM | POA: Diagnosis not present

## 2018-06-24 DIAGNOSIS — J90 Pleural effusion, not elsewhere classified: Secondary | ICD-10-CM

## 2018-06-24 DIAGNOSIS — Z7982 Long term (current) use of aspirin: Secondary | ICD-10-CM

## 2018-06-24 DIAGNOSIS — R634 Abnormal weight loss: Secondary | ICD-10-CM

## 2018-06-24 DIAGNOSIS — G893 Neoplasm related pain (acute) (chronic): Secondary | ICD-10-CM

## 2018-06-24 DIAGNOSIS — Z170 Estrogen receptor positive status [ER+]: Secondary | ICD-10-CM

## 2018-06-24 DIAGNOSIS — F17210 Nicotine dependence, cigarettes, uncomplicated: Secondary | ICD-10-CM

## 2018-06-24 DIAGNOSIS — C773 Secondary and unspecified malignant neoplasm of axilla and upper limb lymph nodes: Secondary | ICD-10-CM

## 2018-06-24 DIAGNOSIS — Z95828 Presence of other vascular implants and grafts: Secondary | ICD-10-CM | POA: Insufficient documentation

## 2018-06-24 DIAGNOSIS — I10 Essential (primary) hypertension: Secondary | ICD-10-CM

## 2018-06-24 DIAGNOSIS — Z79899 Other long term (current) drug therapy: Secondary | ICD-10-CM

## 2018-06-24 DIAGNOSIS — Z7189 Other specified counseling: Secondary | ICD-10-CM

## 2018-06-24 DIAGNOSIS — Z5111 Encounter for antineoplastic chemotherapy: Secondary | ICD-10-CM

## 2018-06-24 DIAGNOSIS — J449 Chronic obstructive pulmonary disease, unspecified: Secondary | ICD-10-CM

## 2018-06-24 DIAGNOSIS — F329 Major depressive disorder, single episode, unspecified: Secondary | ICD-10-CM

## 2018-06-24 DIAGNOSIS — Z9012 Acquired absence of left breast and nipple: Secondary | ICD-10-CM

## 2018-06-24 DIAGNOSIS — C50412 Malignant neoplasm of upper-outer quadrant of left female breast: Secondary | ICD-10-CM | POA: Diagnosis not present

## 2018-06-24 DIAGNOSIS — Z803 Family history of malignant neoplasm of breast: Secondary | ICD-10-CM

## 2018-06-24 DIAGNOSIS — Z923 Personal history of irradiation: Secondary | ICD-10-CM

## 2018-06-24 DIAGNOSIS — R630 Anorexia: Secondary | ICD-10-CM

## 2018-06-24 LAB — CMP (CANCER CENTER ONLY)
ALT: 10 U/L (ref 0–44)
AST: 16 U/L (ref 15–41)
Albumin: 3.1 g/dL — ABNORMAL LOW (ref 3.5–5.0)
Alkaline Phosphatase: 63 U/L (ref 38–126)
Anion gap: 11 (ref 5–15)
BUN: 8 mg/dL (ref 8–23)
CO2: 28 mmol/L (ref 22–32)
Calcium: 8.9 mg/dL (ref 8.9–10.3)
Chloride: 102 mmol/L (ref 98–111)
Creatinine: 0.75 mg/dL (ref 0.44–1.00)
GFR, Est AFR Am: >60 mL/min (ref >60)
GFR, Estimated: >60 mL/min (ref >60)
Glucose, Bld: 98 mg/dL (ref 70–99)
Potassium: 3.7 mmol/L (ref 3.5–5.1)
Sodium: 141 mmol/L (ref 135–145)
Total Bilirubin: 0.3 mg/dL (ref 0.3–1.2)
Total Protein: 6.6 g/dL (ref 6.5–8.1)

## 2018-06-24 LAB — CBC WITH DIFFERENTIAL (CANCER CENTER ONLY)
Abs Immature Granulocytes: 0.03 10*3/uL (ref 0.00–0.07)
Basophils Absolute: 0 10*3/uL (ref 0.0–0.1)
Basophils Relative: 1 %
Eosinophils Absolute: 0 10*3/uL (ref 0.0–0.5)
Eosinophils Relative: 0 %
HCT: 41.7 % (ref 36.0–46.0)
Hemoglobin: 13.1 g/dL (ref 12.0–15.0)
Immature Granulocytes: 1 %
Lymphocytes Relative: 15 %
Lymphs Abs: 0.7 10*3/uL (ref 0.7–4.0)
MCH: 29.2 pg (ref 26.0–34.0)
MCHC: 31.4 g/dL (ref 30.0–36.0)
MCV: 93.1 fL (ref 80.0–100.0)
Monocytes Absolute: 0.9 10*3/uL (ref 0.1–1.0)
Monocytes Relative: 18 %
Neutro Abs: 3.3 10*3/uL (ref 1.7–7.7)
Neutrophils Relative %: 65 %
Platelet Count: 372 10*3/uL (ref 150–400)
RBC: 4.48 MIL/uL (ref 3.87–5.11)
RDW: 13.2 % (ref 11.5–15.5)
WBC Count: 4.9 10*3/uL (ref 4.0–10.5)
nRBC: 0 % (ref 0.0–0.2)

## 2018-06-24 MED ORDER — SODIUM CHLORIDE 0.9% FLUSH
10.0000 mL | INTRAVENOUS | Status: DC | PRN
  Administered 2018-06-24: 10 mL
  Filled 2018-06-24: qty 10

## 2018-06-24 MED ORDER — SODIUM CHLORIDE 0.9 % IJ SOLN: 10.0000 mL | INTRAMUSCULAR | Status: DC | PRN

## 2018-06-24 MED ORDER — PROCHLORPERAZINE MALEATE 10 MG PO TABS
10.0000 mg | ORAL_TABLET | Freq: Once | ORAL | Status: AC
  Administered 2018-06-24: 10 mg via ORAL

## 2018-06-24 MED ORDER — SODIUM CHLORIDE 0.9 % IV SOLN
1.4000 mg/m2 | Freq: Once | INTRAVENOUS | Status: AC
  Administered 2018-06-24: 2.8 mg via INTRAVENOUS
  Filled 2018-06-24: qty 5.6

## 2018-06-24 MED ORDER — SODIUM CHLORIDE 0.9% FLUSH
10.0000 mL | Freq: Once | INTRAVENOUS | Status: AC
  Administered 2018-06-24: 10 mL
  Filled 2018-06-24: qty 10

## 2018-06-24 MED ORDER — PROCHLORPERAZINE MALEATE 10 MG PO TABS
ORAL_TABLET | ORAL | Status: AC
  Filled 2018-06-24: qty 1

## 2018-06-24 MED ORDER — ZOLPIDEM TARTRATE ER 6.25 MG PO TBCR: 6.2500 mg | EXTENDED_RELEASE_TABLET | Freq: Every evening | ORAL | 0 refills | Status: DC | PRN

## 2018-06-24 MED ORDER — SODIUM CHLORIDE 0.9 % IV SOLN
Freq: Once | INTRAVENOUS | Status: AC
  Administered 2018-06-24: 12:00:00 via INTRAVENOUS
  Filled 2018-06-24: qty 250

## 2018-06-24 MED ORDER — SODIUM CHLORIDE 0.9% FLUSH
10.0000 mL | INTRAVENOUS | Status: DC | PRN
  Filled 2018-06-24: qty 10

## 2018-06-24 MED ORDER — HEPARIN SOD (PORK) LOCK FLUSH 100 UNIT/ML IV SOLN
500.0000 [IU] | Freq: Once | INTRAVENOUS | Status: AC | PRN
  Administered 2018-06-24: 13:00:00 500 [IU]
  Filled 2018-06-24: qty 5

## 2018-06-24 NOTE — Patient Instructions (Signed)
[facility] Discharge Instructions for Patients Receiving Chemotherapy  Today you received the following chemotherapy agent: Halaven   To help prevent nausea and vomiting after your treatment, we encourage you to take your nausea medication as directed.   If you develop nausea and vomiting that is not controlled by your nausea medication, call the clinic.   BELOW ARE SYMPTOMS THAT SHOULD BE REPORTED IMMEDIATELY:  *FEVER GREATER THAN 100.5 F  *CHILLS WITH OR WITHOUT FEVER  NAUSEA AND VOMITING THAT IS NOT CONTROLLED WITH YOUR NAUSEA MEDICATION  *UNUSUAL SHORTNESS OF BREATH  *UNUSUAL BRUISING OR BLEEDING  TENDERNESS IN MOUTH AND THROAT WITH OR WITHOUT PRESENCE OF ULCERS  *URINARY PROBLEMS  *BOWEL PROBLEMS  UNUSUAL RASH Items with * indicate a potential emergency and should be followed up as soon as possible.  Feel free to call the clinic should you have any questions or concerns. The clinic phone number is [phone].  Please show the [place] at check-in to the Emergency Department and triage nurse.  Eribulin solution for injection What is this medicine? ERIBULIN (er e bu lin) is a chemotherapy drug. It is used to treat breast cancer and liposarcoma. This medicine may be used for other purposes; ask your health care provider or pharmacist if you have questions. COMMON BRAND NAME(S): Halaven What should I tell my health care provider before I take this medicine? They need to know if you have any of these conditions: -heart disease -history of irregular heartbeat -kidney disease -liver disease -low blood counts, like low white cell, platelet, or red cell counts -low levels of potassium or magnesium in the blood -an unusual or allergic reaction to eribulin, other medicines, foods, dyes, or preservatives -pregnant or trying to get pregnant -breast-feeding How should I use this medicine? This medicine is for infusion into a vein. It is given by a  health care professional in a hospital or clinic setting. Talk to your pediatrician regarding the use of this medicine in children. Special care may be needed. Overdosage: If you think you have taken too much of this medicine contact a poison control center or emergency room at once. NOTE: This medicine is only for you. Do not share this medicine with others. What if I miss a dose? It is important not to miss your dose. Call your doctor or health care professional if you are unable to keep an appointment. What may interact with this medicine? Do not take this medicine with any of the following medications: -amiodarone -astemizole -arsenic trioxide -bepridil -bretylium -chloroquine -chlorpromazine -cisapride -clarithromycin -dextromethorphan, quinidine -disopyramide -dofetilide -droperidol -dronedarone -erythromycin -grepafloxacin -halofantrine -haloperidol -ibutilide -levomethadyl -mesoridazine -methadone -pentamidine -procainamide -quinidine -pimozide -posaconazole -probucol -propafenone -saquinavir -sotalol -sparfloxacin -terfenadine -thioridazine -troleandomycin -ziprasidone This list may not describe all possible interactions. Give your health care provider a list of all the medicines, herbs, non-prescription drugs, or dietary supplements you use. Also tell them if you smoke, drink alcohol, or use illegal drugs. Some items may interact with your medicine. What should I watch for while using this medicine? This drug may make you feel generally unwell. This is not uncommon, as chemotherapy can affect healthy cells as well as cancer cells. Report any side effects. Continue your course of treatment even though you feel ill unless your doctor tells you to stop. Call your doctor or health care professional for advice if you get a fever, chills or sore throat, or other symptoms of a cold or flu. Do not treat yourself. This drug decreases  your body's ability to fight  infections. Try to avoid being around people who are sick. This medicine may increase your risk to bruise or bleed. Call your doctor or health care professional if you notice any unusual bleeding. You may need blood work done while you are taking this medicine. Do not become pregnant while taking this medicine or for 2 weeks after stopping it. Women should inform their doctor if they wish to become pregnant or think they might be pregnant. Men should not father a child while taking this medicine and for 3.5 months after stopping it. There is a potential for serious side effects to an unborn child. Talk to your health care professional or pharmacist for more information. Do not breast-feed an infant while taking this medicine or for 2 weeks after stopping it. What side effects may I notice from receiving this medicine? Side effects that you should report to your doctor or health care professional as soon as possible: -allergic reactions like skin rash, itching or hives, swelling of the face, lips, or tongue -low blood counts - this medicine may decrease the number of white blood cells, red blood cells and platelets. You may be at increased risk for infections and bleeding. -signs of infection - fever or chills, cough, sore throat, pain or difficulty passing urine -signs of decreased platelets or bleeding - bruising, pinpoint red spots on the skin, black, tarry stools, blood in the urine -signs of decreased red blood cells - unusually weak or tired, fainting spells, lightheadedness -pain, tingling, numbness in the hands or feet Side effects that usually do not require medical attention (report to your doctor or health care professional if they continue or are bothersome): -constipation -hair loss -headache -loss of appetite -muscle or joint pain -nausea, vomiting -stomach pain This list may not describe all possible side effects. Call your doctor for medical advice about side effects. You may  report side effects to FDA at 1-800-FDA-1088. Where should I keep my medicine? This drug is given in a hospital or clinic and will not be stored at home. NOTE: This sheet is a summary. It may not cover all possible information. If you have questions about this medicine, talk to your doctor, pharmacist, or health care provider.  2019 Elsevier/Gold Standard (2015-01-24 10:11:26)

## 2018-06-24 NOTE — Assessment & Plan Note (Signed)
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  Patient received neoadjuvant chemotherapy with dose dense Adriamycin and Cytoxan followed by Taxol and carboplatin followed by left mastectomy and axillary lymph node dissection 08/23/2015 followed by adjuvant radiation completed 01/08/2016  Metastatic breast cancer: 05/13/2018:Left pleural effusion with left upper lobe consolidation and paratracheal and AP window lymphadenopathy: Thoracentesis: Cytology, adenocarcinoma consistent with breast primary, positive for GA TA-3 and CK7, negative for CK20 and TTF-1, ER PR HER-2 negative.  [facility] molecular testing: PDL 1 insufficient tissue, no other actionable mutations PET CT scan: 6/50/3546: Hypermetabolic lymph nodes right thoracic inlet, mediastinum both hilar regions, left upper lobe mass hypermetabolic, pleural nodule, pleural activity, opacities peripheral right lung.  Current treatment: Palliative chemotherapy with Halaven every 2 weeks today cycle 1 day 15 Lab review: Chemo toxicities: Overall she tolerated it fairly well except for neutropenia with ANC of 0.7 on cycle 1 day 8.  Because of this we change her treatment to every 2 weeks.

## 2018-06-30 ENCOUNTER — Other Ambulatory Visit: Payer: Medicare Other

## 2018-06-30 ENCOUNTER — Ambulatory Visit: Payer: Medicare Other | Admitting: Hematology and Oncology

## 2018-06-30 ENCOUNTER — Encounter (HOSPITAL_COMMUNITY): Payer: Self-pay

## 2018-06-30 ENCOUNTER — Emergency Department (HOSPITAL_COMMUNITY): Payer: Medicare Other

## 2018-06-30 ENCOUNTER — Ambulatory Visit: Payer: Medicare Other

## 2018-06-30 ENCOUNTER — Inpatient Hospital Stay (HOSPITAL_COMMUNITY): Payer: Medicare Other

## 2018-06-30 ENCOUNTER — Inpatient Hospital Stay (HOSPITAL_COMMUNITY)
Admission: EM | Admit: 2018-06-30 | Discharge: 2018-07-02 | DRG: 919 | Disposition: A | Discharge status: Home / Self Care | Payer: Medicare Other | Attending: Internal Medicine | Admitting: Internal Medicine

## 2018-06-30 ENCOUNTER — Other Ambulatory Visit: Payer: Self-pay

## 2018-06-30 ENCOUNTER — Telehealth: Payer: Self-pay | Admitting: *Deleted

## 2018-06-30 DIAGNOSIS — Y848 Other medical procedures as the cause of abnormal reaction of the patient, or of later complication, without mention of misadventure at the time of the procedure: Secondary | ICD-10-CM | POA: Diagnosis present

## 2018-06-30 DIAGNOSIS — J449 Chronic obstructive pulmonary disease, unspecified: Secondary | ICD-10-CM | POA: Diagnosis present

## 2018-06-30 DIAGNOSIS — Z885 Allergy status to narcotic agent status: Secondary | ICD-10-CM

## 2018-06-30 DIAGNOSIS — C50412 Malignant neoplasm of upper-outer quadrant of left female breast: Secondary | ICD-10-CM | POA: Diagnosis present

## 2018-06-30 DIAGNOSIS — F17210 Nicotine dependence, cigarettes, uncomplicated: Secondary | ICD-10-CM | POA: Diagnosis present

## 2018-06-30 DIAGNOSIS — Z171 Estrogen receptor negative status [ER-]: Secondary | ICD-10-CM | POA: Diagnosis not present

## 2018-06-30 DIAGNOSIS — E876 Hypokalemia: Secondary | ICD-10-CM

## 2018-06-30 DIAGNOSIS — I10 Essential (primary) hypertension: Secondary | ICD-10-CM | POA: Diagnosis present

## 2018-06-30 DIAGNOSIS — Z7982 Long term (current) use of aspirin: Secondary | ICD-10-CM

## 2018-06-30 DIAGNOSIS — T451X5A Adverse effect of antineoplastic and immunosuppressive drugs, initial encounter: Secondary | ICD-10-CM | POA: Diagnosis present

## 2018-06-30 DIAGNOSIS — Z803 Family history of malignant neoplasm of breast: Secondary | ICD-10-CM

## 2018-06-30 DIAGNOSIS — Z886 Allergy status to analgesic agent status: Secondary | ICD-10-CM

## 2018-06-30 DIAGNOSIS — T85618A Breakdown (mechanical) of other specified internal prosthetic devices, implants and grafts, initial encounter: Secondary | ICD-10-CM | POA: Diagnosis not present

## 2018-06-30 DIAGNOSIS — R0600 Dyspnea, unspecified: Secondary | ICD-10-CM

## 2018-06-30 DIAGNOSIS — Z9012 Acquired absence of left breast and nipple: Secondary | ICD-10-CM | POA: Diagnosis not present

## 2018-06-30 DIAGNOSIS — Z79891 Long term (current) use of opiate analgesic: Secondary | ICD-10-CM | POA: Diagnosis not present

## 2018-06-30 DIAGNOSIS — J910 Malignant pleural effusion: Secondary | ICD-10-CM | POA: Diagnosis present

## 2018-06-30 DIAGNOSIS — F419 Anxiety disorder, unspecified: Secondary | ICD-10-CM | POA: Diagnosis present

## 2018-06-30 DIAGNOSIS — F329 Major depressive disorder, single episode, unspecified: Secondary | ICD-10-CM

## 2018-06-30 DIAGNOSIS — C782 Secondary malignant neoplasm of pleura: Secondary | ICD-10-CM | POA: Diagnosis present

## 2018-06-30 DIAGNOSIS — J90 Pleural effusion, not elsewhere classified: Secondary | ICD-10-CM | POA: Diagnosis present

## 2018-06-30 DIAGNOSIS — Z79899 Other long term (current) drug therapy: Secondary | ICD-10-CM | POA: Diagnosis not present

## 2018-06-30 DIAGNOSIS — Z1159 Encounter for screening for other viral diseases: Secondary | ICD-10-CM | POA: Diagnosis not present

## 2018-06-30 DIAGNOSIS — D61810 Antineoplastic chemotherapy induced pancytopenia: Secondary | ICD-10-CM | POA: Diagnosis present

## 2018-06-30 DIAGNOSIS — Z9109 Other allergy status, other than to drugs and biological substances: Secondary | ICD-10-CM

## 2018-06-30 LAB — HEMOGLOBIN AND HEMATOCRIT, BLOOD
HCT: 33.9 % — ABNORMAL LOW (ref 36.0–46.0)
HCT: 34.3 % — ABNORMAL LOW (ref 36.0–46.0)
Hemoglobin: 10.6 g/dL — ABNORMAL LOW (ref 12.0–15.0)
Hemoglobin: 10.7 g/dL — ABNORMAL LOW (ref 12.0–15.0)

## 2018-06-30 LAB — CBC WITH DIFFERENTIAL/PLATELET
Abs Immature Granulocytes: 0.74 10*3/uL — ABNORMAL HIGH (ref 0.00–0.07)
Basophils Absolute: 0 10*3/uL (ref 0.0–0.1)
Basophils Relative: 1 %
Eosinophils Absolute: 0 10*3/uL (ref 0.0–0.5)
Eosinophils Relative: 0 %
HCT: 17.4 % — ABNORMAL LOW (ref 36.0–46.0)
Hemoglobin: 5.4 g/dL — CL (ref 12.0–15.0)
Immature Granulocytes: 15 %
Lymphocytes Relative: 11 %
Lymphs Abs: 0.5 10*3/uL — ABNORMAL LOW (ref 0.7–4.0)
MCH: 29.7 pg (ref 26.0–34.0)
MCHC: 31 g/dL (ref 30.0–36.0)
MCV: 95.6 fL (ref 80.0–100.0)
Monocytes Absolute: 0.5 10*3/uL (ref 0.1–1.0)
Monocytes Relative: 10 %
Neutro Abs: 3.1 10*3/uL (ref 1.7–7.7)
Neutrophils Relative %: 63 %
Platelets: 379 10*3/uL (ref 150–400)
RBC: 1.82 MIL/uL — ABNORMAL LOW (ref 3.87–5.11)
RDW: 13.3 % (ref 11.5–15.5)
WBC: 4.9 10*3/uL (ref 4.0–10.5)
nRBC: 0 % (ref 0.0–0.2)

## 2018-06-30 LAB — TYPE AND SCREEN
ABO/RH(D): A POS
Antibody Screen: NEGATIVE

## 2018-06-30 LAB — COMPREHENSIVE METABOLIC PANEL
ALT: 8 U/L (ref 0–44)
AST: 11 U/L — ABNORMAL LOW (ref 15–41)
Albumin: 3 g/dL — ABNORMAL LOW (ref 3.5–5.0)
Alkaline Phosphatase: 52 U/L (ref 38–126)
Anion gap: 12 (ref 5–15)
BUN: 10 mg/dL (ref 8–23)
CO2: 27 mmol/L (ref 22–32)
Calcium: 8.6 mg/dL — ABNORMAL LOW (ref 8.9–10.3)
Chloride: 101 mmol/L (ref 98–111)
Creatinine, Ser: 0.85 mg/dL (ref 0.44–1.00)
GFR calc Af Amer: >60 mL/min (ref >60)
GFR calc non Af Amer: >60 mL/min (ref >60)
Glucose, Bld: 115 mg/dL — ABNORMAL HIGH (ref 70–99)
Potassium: 3.3 mmol/L — ABNORMAL LOW (ref 3.5–5.1)
Sodium: 140 mmol/L (ref 135–145)
Total Bilirubin: 0.6 mg/dL (ref 0.3–1.2)
Total Protein: 6.4 g/dL — ABNORMAL LOW (ref 6.5–8.1)

## 2018-06-30 LAB — SARS CORONAVIRUS 2 BY RT PCR (HOSPITAL ORDER, PERFORMED IN ~~LOC~~ HOSPITAL LAB): SARS Coronavirus 2: NEGATIVE

## 2018-06-30 LAB — LACTIC ACID, PLASMA
Lactic Acid, Venous: 1.3 mmol/L (ref 0.5–1.9)
Lactic Acid, Venous: 1.1 mmol/L (ref 0.5–1.9)

## 2018-06-30 LAB — TROPONIN I (HIGH SENSITIVITY)
Troponin I (High Sensitivity): 7 ng/L (ref <18)
Troponin I (High Sensitivity): 8 ng/L (ref <18)

## 2018-06-30 LAB — MAGNESIUM: Magnesium: 1.9 mg/dL (ref 1.7–2.4)

## 2018-06-30 MED ORDER — PROMETHAZINE HCL 25 MG PO TABS: 12.5000 mg | ORAL_TABLET | Freq: Four times a day (QID) | ORAL | Status: DC | PRN

## 2018-06-30 MED ORDER — OXYCODONE-ACETAMINOPHEN 5-325 MG PO TABS
1.0000 | ORAL_TABLET | Freq: Three times a day (TID) | ORAL | Status: DC | PRN
  Administered 2018-06-30 – 2018-07-01 (×4): 1 via ORAL
  Filled 2018-06-30 (×4): qty 1

## 2018-06-30 MED ORDER — HYDROMORPHONE HCL 1 MG/ML IJ SOLN
0.5000 mg | Freq: Once | INTRAMUSCULAR | Status: AC
  Administered 2018-06-30: 0.5 mg via INTRAVENOUS
  Filled 2018-06-30: qty 0.5

## 2018-06-30 MED ORDER — AMLODIPINE BESYLATE 5 MG PO TABS
5.0000 mg | ORAL_TABLET | Freq: Every day | ORAL | Status: DC
  Administered 2018-07-01 – 2018-07-02 (×2): 5 mg via ORAL
  Filled 2018-06-30 (×2): qty 1

## 2018-06-30 MED ORDER — ENOXAPARIN SODIUM 40 MG/0.4ML ~~LOC~~ SOLN
40.0000 mg | SUBCUTANEOUS | Status: DC
  Administered 2018-06-30 – 2018-07-01 (×2): 40 mg via SUBCUTANEOUS
  Filled 2018-06-30 (×2): qty 0.4

## 2018-06-30 MED ORDER — PIPERACILLIN-TAZOBACTAM 3.375 G IVPB
3.3750 g | Freq: Three times a day (TID) | INTRAVENOUS | Status: DC
  Administered 2018-06-30 – 2018-07-02 (×6): 3.375 g via INTRAVENOUS
  Filled 2018-06-30 (×8): qty 50

## 2018-06-30 MED ORDER — ACETAMINOPHEN 325 MG PO TABS
650.0000 mg | ORAL_TABLET | Freq: Four times a day (QID) | ORAL | Status: DC | PRN
  Filled 2018-06-30: qty 2

## 2018-06-30 MED ORDER — DEXAMETHASONE 0.5 MG PO TABS
1.0000 mg | ORAL_TABLET | Freq: Every day | ORAL | Status: DC
  Administered 2018-07-01 – 2018-07-02 (×2): 1 mg via ORAL
  Filled 2018-06-30 (×2): qty 2

## 2018-06-30 MED ORDER — POTASSIUM CHLORIDE CRYS ER 20 MEQ PO TBCR
40.0000 meq | EXTENDED_RELEASE_TABLET | Freq: Once | ORAL | Status: AC
  Administered 2018-06-30: 40 meq via ORAL
  Filled 2018-06-30: qty 2

## 2018-06-30 MED ORDER — ZOLPIDEM TARTRATE 5 MG PO TABS: 5.0000 mg | ORAL_TABLET | Freq: Every evening | ORAL | Status: DC | PRN

## 2018-06-30 MED ORDER — SODIUM CHLORIDE 0.9% FLUSH
3.0000 mL | Freq: Two times a day (BID) | INTRAVENOUS | Status: DC
  Administered 2018-07-01: 3 mL via INTRAVENOUS

## 2018-06-30 MED ORDER — GABAPENTIN 300 MG PO CAPS
300.0000 mg | ORAL_CAPSULE | Freq: Three times a day (TID) | ORAL | Status: DC
  Administered 2018-06-30 – 2018-07-02 (×5): 300 mg via ORAL
  Filled 2018-06-30 (×6): qty 1

## 2018-06-30 MED ORDER — SODIUM CHLORIDE 0.9 % IV SOLN
10.0000 mL/h | Freq: Once | INTRAVENOUS | Status: AC
  Administered 2018-06-30: 10 mL/h via INTRAVENOUS

## 2018-06-30 MED ORDER — ALBUTEROL SULFATE (2.5 MG/3ML) 0.083% IN NEBU: 2.5000 mg | INHALATION_SOLUTION | RESPIRATORY_TRACT | Status: DC | PRN

## 2018-06-30 MED ORDER — METOPROLOL TARTRATE 25 MG PO TABS
25.0000 mg | ORAL_TABLET | Freq: Two times a day (BID) | ORAL | Status: DC
  Administered 2018-06-30 – 2018-07-02 (×4): 25 mg via ORAL
  Filled 2018-06-30 (×4): qty 1

## 2018-06-30 MED ORDER — METRONIDAZOLE IN NACL 5-0.79 MG/ML-% IV SOLN
500.0000 mg | Freq: Once | INTRAVENOUS | Status: AC
  Administered 2018-06-30: 500 mg via INTRAVENOUS
  Filled 2018-06-30: qty 100

## 2018-06-30 MED ORDER — ALPRAZOLAM 0.5 MG PO TABS
0.5000 mg | ORAL_TABLET | Freq: Three times a day (TID) | ORAL | Status: DC | PRN
  Administered 2018-06-30 – 2018-07-01 (×3): 0.5 mg via ORAL
  Filled 2018-06-30 (×3): qty 1

## 2018-06-30 MED ORDER — SODIUM CHLORIDE 0.9 % IV SOLN
2.0000 g | Freq: Once | INTRAVENOUS | Status: AC
  Administered 2018-06-30: 2 g via INTRAVENOUS
  Filled 2018-06-30: qty 2

## 2018-06-30 MED ORDER — SODIUM CHLORIDE 0.9 % IV BOLUS
500.0000 mL | Freq: Once | INTRAVENOUS | Status: AC
  Administered 2018-06-30: 500 mL via INTRAVENOUS

## 2018-06-30 MED ORDER — ALBUTEROL SULFATE (2.5 MG/3ML) 0.083% IN NEBU
2.5000 mg | INHALATION_SOLUTION | Freq: Four times a day (QID) | RESPIRATORY_TRACT | Status: DC
  Administered 2018-06-30 – 2018-07-02 (×8): 2.5 mg via RESPIRATORY_TRACT
  Filled 2018-06-30 (×8): qty 3

## 2018-06-30 MED ORDER — VANCOMYCIN HCL IN DEXTROSE 1-5 GM/200ML-% IV SOLN
1000.0000 mg | Freq: Once | INTRAVENOUS | Status: AC
  Administered 2018-06-30: 1000 mg via INTRAVENOUS
  Filled 2018-06-30: qty 200

## 2018-06-30 MED ORDER — HYDROMORPHONE HCL 1 MG/ML IJ SOLN
0.5000 mg | Freq: Once | INTRAMUSCULAR | Status: AC
  Administered 2018-06-30: 0.5 mg via INTRAVENOUS
  Filled 2018-06-30: qty 1

## 2018-06-30 MED ORDER — ACETAMINOPHEN 650 MG RE SUPP: 650.0000 mg | Freq: Four times a day (QID) | RECTAL | Status: DC | PRN

## 2018-06-30 MED ORDER — PIPERACILLIN-TAZOBACTAM 3.375 G IVPB 30 MIN: 3.3750 g | Freq: Three times a day (TID) | INTRAVENOUS | Status: DC

## 2018-06-30 MED ORDER — DEXAMETHASONE 0.5 MG PO TABS: 1.0000 mg | ORAL_TABLET | Freq: Every day | ORAL | Status: DC

## 2018-06-30 MED ORDER — ASPIRIN EC 81 MG PO TBEC
81.0000 mg | DELAYED_RELEASE_TABLET | Freq: Every day | ORAL | Status: DC
  Administered 2018-07-01 – 2018-07-02 (×2): 81 mg via ORAL
  Filled 2018-06-30 (×2): qty 1

## 2018-06-30 MED ORDER — VANCOMYCIN HCL 10 G IV SOLR
1500.0000 mg | INTRAVENOUS | Status: DC
  Administered 2018-06-30 – 2018-07-01 (×2): 1500 mg via INTRAVENOUS
  Filled 2018-06-30 (×3): qty 1500

## 2018-06-30 NOTE — ED Notes (Addendum)
w

## 2018-06-30 NOTE — Progress Notes (Signed)
Referring Physician(s): Dr. Doristine Bosworth  Supervising Physician: Arne [place]  Patient Status:  [facility] - In-pt  Chief Complaint: "I couldn't breathe"  HPI: Patient with history of breast cancer and recurrent left pleural effusion admitted to [facility] with shortness of breath requiring oxygen at home.  Patient known to radiology due to several recent thoracentesis and ultimate PleurX catheter placement 06/02/18 by Dr. Pascal Lux.  Patient states she was initially draining 400-500 mL of fluid every 2-3 days at home, but for the past 1.5-2 weeks has been draining 50 mL or so every few days.  She continued with decreased drainage without change to her breathing until Tuesday of this week when she noticed acute shortness of breath.  She states she became worse today prompting her to use home O2 and seek evaluation.  Currently sitting in bed on 2L [place].  Some shortness of breath with conversation.  WBC 4.9, Lactic acid 1.1, Covid neg. No history of DVT or leg swelling. Denies fever, chills, abdominal pain. Endorses nausea with cancer treatment.  Allergies: Fentanyl and Morphine and related  Medications: Prior to Admission medications   Medication Sig Start Date End Date Taking? Authorizing Provider  albuterol (PROVENTIL HFA;VENTOLIN HFA) 108 (90 Base) MCG/ACT inhaler Inhale 2 puffs into the lungs every 6 (six) hours as needed for wheezing or shortness of breath.    Yes [provider]  albuterol (PROVENTIL) (2.5 MG/3ML) 0.083% nebulizer solution Take 3 mLs (2.5 mg total) by nebulization every 6 (six) hours as needed for wheezing or shortness of breath. 05/15/18  Yes Regalado, Belkys A, MD  ALPRAZolam (XANAX) 0.5 MG tablet Take 1 tablet (0.5 mg total) by mouth 3 (three) times daily as needed for anxiety or sleep. 05/15/18  Yes Regalado, Belkys A, MD  amLODipine (NORVASC) 5 MG tablet Take 1 tablet (5 mg total) by mouth daily. 05/16/18  Yes Regalado, Belkys A, MD  aspirin EC 81 MG tablet Take 81 mg by  mouth daily.   Yes [provider]  dexamethasone (DECADRON) 1 MG tablet Take 1 tablet (1 mg total) by mouth daily. 06/17/18  Yes Nicholas Lose, MD  gabapentin (NEURONTIN) 300 MG capsule Take 1 capsule (300 mg total) by mouth 3 (three) times daily. 09/23/17  Yes Nicholas Lose, MD  lidocaine-prilocaine (EMLA) cream Apply to affected area once Patient taking differently: Apply 1 application topically daily as needed (port access).  05/19/18  Yes Nicholas Lose, MD  metoprolol tartrate (LOPRESSOR) 25 MG tablet Take 1 tablet (25 mg total) by mouth 2 (two) times daily. 06/02/18  Yes Georgette Shell, MD  ondansetron (ZOFRAN) 8 MG tablet Take 1 tablet (8 mg total) by mouth every 8 (eight) hours as needed for nausea. 09/23/17  Yes Nicholas Lose, MD  oxyCODONE-acetaminophen (PERCOCET/ROXICET) 5-325 MG tablet Take 1 tablet by mouth every 8 (eight) hours as needed for moderate pain or severe pain (1 tab every 4-6 hour prn pain). Patient taking differently: Take 1 tablet by mouth every 6 (six) hours as needed for moderate pain or severe pain.  06/17/18  Yes Nicholas Lose, MD  PARoxetine (PAXIL) 20 MG tablet Take 1 tablet (20 mg total) by mouth 2 (two) times daily. 05/15/18  Yes Regalado, Belkys A, MD  PREMARIN vaginal cream INSERT 1 APPLICATORFUL VAGINALLY DAILY Patient taking differently: Place 1 Applicatorful vaginally daily as needed (dryness).  04/19/17  Yes Nicholas Lose, MD  prochlorperazine (COMPAZINE) 10 MG tablet Take 1 tablet (10 mg total) by mouth every 6 (six) hours as needed (  Nausea or vomiting). Patient taking differently: Take 10 mg by mouth every 6 (six) hours as needed for nausea or vomiting.  05/19/18  Yes Nicholas Lose, MD  zolpidem (AMBIEN CR) 6.25 MG CR tablet Take 1 tablet (6.25 mg total) by mouth at bedtime as needed for sleep. 06/24/18  Yes Nicholas Lose, MD     Vital Signs: BP (!) 129/92 (BP Location: Right Arm)   Pulse (!) 112   Temp 98.1 F (36.7 C) (Oral)   Resp 16   SpO2  100%   Physical Exam Constitutional:      Appearance: She is well-developed.  Cardiovascular:     Rate and Rhythm: Regular rhythm. Tachycardia present.  Pulmonary:     Effort: Tachypnea present.     Breath sounds: Examination of the left-middle field reveals decreased breath sounds. Examination of the left-lower field reveals decreased breath sounds. Decreased breath sounds present.  Chest:     Comments: PleurX in place.  Site intact with some leakage around tube.  Skin:    General: Skin is warm and dry.  Neurological:     General: No focal deficit present.     Mental Status: She is alert and oriented to person, place, and time.     Imaging: Dg Chest Port 1 View  Result Date: 06/30/2018 CLINICAL DATA:  Increased shortness of breath with decreased drainage from the PleurX catheter. History of metastatic lung cancer. EXAM: PORTABLE CHEST 1 VIEW COMPARISON:  PET-CT dated June 15, 2018. Chest x-ray dated Jun 03, 2018. FINDINGS: Unchanged right chest wall port catheter. Unchanged left PleurX catheter. Increasing size of the now large loculated left pleural effusion with likely complete collapse of the left lower lobe and partial collapse of the left upper lobe. The right lung is clear. No pneumothorax. Stable cardiomediastinal silhouette. No acute osseous abnormality. IMPRESSION: 1. Increasing now large loculated left pleural effusion with worsening aeration in the left lung. Electronically Signed   By: Titus Dubin M.D.   On: 06/30/2018 12:44    Labs:  CBC: Recent Labs    06/09/18 0948 06/17/18 1144 06/24/18 1107 06/30/18 1151 06/30/18 1355 06/30/18 1437  WBC 6.6 2.0* 4.9 4.9  --   --   HGB 13.3 13.5 13.1 5.4* 10.6* 10.7*  HCT 42.2 42.9 41.7 17.4* 33.9* 34.3*  PLT 327 342 372 379  --   --     COAGS: Recent Labs    05/25/18 1023 06/01/18 1714  INR 1.0 1.0    BMP: Recent Labs    06/09/18 0948 06/17/18 1144 06/24/18 1107 06/30/18 1151  NA 140 142 141 140  K 3.7  3.2* 3.7 3.3*  CL 103 103 102 101  CO2 27 27 28 27   GLUCOSE 88 108* 98 115*  BUN 8 5* 8 10  CALCIUM 8.9 8.9 8.9 8.6*  CREATININE 0.80 0.75 0.75 0.85  GFRNONAA >60 >60 >60 >60  GFRAA >60 >60 >60 >60    LIVER FUNCTION TESTS: Recent Labs    06/09/18 0948 06/17/18 1144 06/24/18 1107 06/30/18 1151  BILITOT 0.4 0.4 0.3 0.6  AST 12* 12* 16 11*  ALT 6 7 10 8   ALKPHOS 56 59 63 52  PROT 6.4* 6.4* 6.6 6.4*  ALBUMIN 3.1* 3.3* 3.1* 3.0*    Assessment and Plan: Recurrent left pleural effusion Patient with history of recurrent left pleural effusion s/p PleurX catheter placement 06/02/18.  Her drainage volume has decreased over the past 1.5-2 weeks, however she was breathing without increased effort until Tuesday  of this week.  IR consulted for evaluation of PleurX.  Reviewed case with Dr. Vernard Gambles who recommends CT Chest. Patient with history of loculated effusion.  This has been ordered.   Electronically Signed: Docia Barrier, PA 06/30/2018, 4:48 PM   I spent a total of 15 Minutes at the the patient's bedside AND on the patient's hospital floor or unit, greater than 50% of which was counseling/coordinating care for pleural effusion, left.

## 2018-06-30 NOTE — Telephone Encounter (Signed)
Received call from pt husband stating pt was on her way to the emergency department at [facility] for increased shortness of breath.

## 2018-06-30 NOTE — ED Notes (Signed)
Bed: OQ94 Expected date:  Expected time:  Means of arrival:  Comments: EMS-cancer patient-[facility]-room 23

## 2018-06-30 NOTE — H&P (Signed)
History and Physical    [name] BTD:[id number] DOB: Nov 30, 1955 DOA: 06/30/2018  PCP: [name], MD  Patient coming from: Home  I have personally briefly reviewed patient's old medical records in [place]  Chief Complaint: Cough/shortness of breath  HPI: Deborah Mcintyre is a 63 y.o. 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  According to patient, she has been having nonproductive cough as well as shortness of breath which gets worse since about 2 days.  She also has been having left anterior chest pain which is constant and achy with no aggravating or relieving factor.  She normally does not use any oxygen at home however since past 2 days, she has been using 2 L of oxygen with minimal relief.  She denies any fever, nausea, vomiting, any problem with urination or with bowel movement.  No sick contact or any recent travel.  According to patient, she drains Pleurx catheter about every third day and usually gets 150 mL output since last few days, she has been getting only about 50 mL out.  ED Course: Upon arrival to the emergency department, she was tachycardic, tachypneic and hypoxic.  CBC revealed leukocytosis.  Initial CBC revealed hemoglobin of over 5 but this was repeated and it showed more than 10 so perhaps the first 1 was erroneous.  Lactic acid within normal limits.  Potassium 3.3.  Chest x-ray shows loculated left-sided pleural effusion worsened than before.  She was given vancomycin, Zosyn and Flagyl in the emergency department and hospital service was consulted.  IR has been consulted per my request.  Review of Systems: As per HPI otherwise 10 point review of systems negative.     Past Medical History:  Diagnosis Date  . Anxiety   . Arthritis   . Bronchitis   . COPD (chronic obstructive pulmonary disease) ([place])   . Depression   . Hypertension   . Malignant neoplasm of upper-outer quadrant of left female breast ([place]) 01/04/2015  . Nocturia   . Numbness and tingling    toes - bilateral  . Pneumonia   . PONV (postoperative nausea and vomiting)    Nausea    Past Surgical History:  Procedure Laterality Date  . BREAST LUMPECTOMY Bilateral    x3  . BREAST LUMPECTOMY WITH RADIOACTIVE SEED LOCALIZATION Right 08/23/2015   Procedure: RIGHT BREAST LUMPECTOMY WITH RADIOACTIVE SEED LOCALIZATION;  Surgeon: Alphonsa Overall, MD;  Location: [facility];  Service: General;  Laterality: Right;  . CESAREAN SECTION     x4  . IR GUIDED DRAIN W CATHETER PLACEMENT  06/02/2018  . IR IMAGING GUIDED PORT INSERTION  05/25/2018  . IR REMOVAL TUN ACCESS W/ PORT W/O FL MOD SED  10/20/2016  . IR THORACENTESIS ASP PLEURAL SPACE W/IMG GUIDE  05/12/2018  . MASTECTOMY Left 08/23/2015    RIGHT BREAST LUMPECTOMY WITH RADIOACTIVE SEED LOCALIZATION,  LEFT MASTECTOMY WITH AXILLARY LYMPH NODE DISSECTION  . MASTECTOMY WITH AXILLARY LYMPH NODE DISSECTION Left 08/23/2015   Procedure: LEFT MASTECTOMY WITH AXILLARY LYMPH NODE DISSECTION;  Surgeon: Alphonsa Overall, MD;  Location: [facility];  Service: General;  Laterality: Left;  [name] MULTIPLE TOOTH EXTRACTIONS       reports that she has been smoking cigarettes. She has a 33.75 pack-year  smoking history. She has never used smokeless tobacco. She reports current alcohol use. She reports that she does not use drugs.  Allergies  Allergen Reactions  . Fentanyl Nausea And Vomiting and Other (See Comments)    Headache  . Morphine And Related Nausea And Vomiting and Other (See Comments)    Headache    Family History  Problem Relation Age of Onset  . Breast cancer Maternal Aunt     Prior to Admission medications   Medication Sig Start Date End Date Taking?  Authorizing Provider  albuterol (PROVENTIL HFA;VENTOLIN HFA) 108 (90 Base) MCG/ACT inhaler Inhale 2 puffs into the lungs every 6 (six) hours as needed for wheezing or shortness of breath.    Yes [provider]  albuterol (PROVENTIL) (2.5 MG/3ML) 0.083% nebulizer solution Take 3 mLs (2.5 mg total) by nebulization every 6 (six) hours as needed for wheezing or shortness of breath. 05/15/18  Yes Regalado, Belkys A, MD  ALPRAZolam (XANAX) 0.5 MG tablet Take 1 tablet (0.5 mg total) by mouth 3 (three) times daily as needed for anxiety or sleep. 05/15/18  Yes Regalado, Belkys A, MD  amLODipine (NORVASC) 5 MG tablet Take 1 tablet (5 mg total) by mouth daily. 05/16/18  Yes Regalado, Belkys A, MD  aspirin EC 81 MG tablet Take 81 mg by mouth daily.   Yes [provider]  dexamethasone (DECADRON) 1 MG tablet Take 1 tablet (1 mg total) by mouth daily. 06/17/18  Yes Nicholas Lose, MD  gabapentin (NEURONTIN) 300 MG capsule Take 1 capsule (300 mg total) by mouth 3 (three) times daily. 09/23/17  Yes Nicholas Lose, MD  lidocaine-prilocaine (EMLA) cream Apply to affected area once Patient taking differently: Apply 1 application topically daily as needed (port access).  05/19/18  Yes Nicholas Lose, MD  metoprolol tartrate (LOPRESSOR) 25 MG tablet Take 1 tablet (25 mg total) by mouth 2 (two) times daily. 06/02/18  Yes Georgette Shell, MD  ondansetron (ZOFRAN) 8 MG tablet Take 1 tablet (8 mg total) by mouth every 8 (eight) hours as needed for nausea. 09/23/17  Yes Nicholas Lose, MD  oxyCODONE-acetaminophen (PERCOCET/ROXICET) 5-325 MG tablet Take 1 tablet by mouth every 8 (eight) hours as needed for moderate pain or severe pain (1 tab every 4-6 hour prn pain). Patient taking differently: Take 1 tablet by mouth every 6 (six) hours as needed for moderate pain or severe pain.  06/17/18  Yes Nicholas Lose, MD  PARoxetine (PAXIL) 20 MG tablet Take 1 tablet (20 mg total) by mouth 2 (two) times daily. 05/15/18  Yes  Regalado, Belkys A, MD  PREMARIN vaginal cream INSERT 1 APPLICATORFUL VAGINALLY DAILY Patient taking differently: Place 1 Applicatorful vaginally daily as needed (dryness).  04/19/17  Yes Nicholas Lose, MD  prochlorperazine (COMPAZINE) 10 MG tablet Take 1 tablet (10 mg total) by mouth every 6 (six) hours as needed (Nausea or vomiting). Patient taking differently: Take 10 mg by mouth every 6 (six) hours as needed for nausea or vomiting.  05/19/18  Yes Nicholas Lose, MD  zolpidem (AMBIEN CR) 6.25 MG CR tablet Take 1 tablet (6.25 mg total) by mouth at bedtime as needed for sleep. 06/24/18  Yes Nicholas Lose, MD    Physical Exam: Vitals:   06/30/18 1110 06/30/18 1112 06/30/18 1156 06/30/18 1300  BP:  130/68  (!) 143/87  Pulse:  (!) 121  (!) 114  Resp:  (!) 24  19  Temp:  97.7 F (36.5 C) 99.8 F (37.7 C)   TempSrc:  Oral Rectal   SpO2: 100% 100%  99%    Constitutional: NAD, calm, comfortable Vitals:   06/30/18 1110 06/30/18 1112 06/30/18 1156 06/30/18 1300  BP:  130/68  (!) 143/87  Pulse:  (!) 121  (!) 114  Resp:  (!) 24  19  Temp:  97.7 F (36.5 C) 99.8 F (37.7 C)   TempSrc:  Oral Rectal   SpO2: 100% 100%  99%   Eyes: PERRL, lids and conjunctivae normal ENMT: Mucous membranes are moist. Posterior pharynx clear of any exudate or lesions.Normal dentition.  Neck: normal, supple, no masses, no thyromegaly Respiratory: Clear to auscultation on the right side, severely diminished breath sounds on the left side, no wheezing, no crackles. Normal respiratory effort. No accessory muscle use.  Cardiovascular: Regular rate and rhythm, no murmurs / rubs / gallops. No extremity edema. 2+ pedal pulses. No carotid bruits.  Abdomen: no tenderness, no masses palpated. No hepatosplenomegaly. Bowel sounds positive.  Musculoskeletal: no clubbing / cyanosis. No joint deformity upper and lower extremities. Good ROM, no contractures. Normal muscle tone.  Skin: no rashes, lesions, ulcers. No induration  Neurologic: CN 2-12 grossly intact. Sensation intact, DTR normal. Strength 5/5 in all 4.  Psychiatric: Normal judgment and insight. Alert and oriented x 3. Normal mood.    Labs on Admission: I have personally reviewed following labs and imaging studies  CBC: Recent Labs  Lab 06/24/18 1107 06/30/18 1151 06/30/18 1355 06/30/18 1437  WBC 4.9 4.9  --   --   NEUTROABS 3.3 3.1  --   --   HGB 13.1 5.4* 10.6* 10.7*  HCT 41.7 17.4* 33.9* 34.3*  MCV 93.1 95.6  --   --   PLT 372 379  --   --    Basic Metabolic Panel: Recent Labs  Lab 06/24/18 1107 06/30/18 1151  NA 141 140  K 3.7 3.3*  CL 102 101  CO2 28 27  GLUCOSE 98 115*  BUN 8 10  CREATININE 0.75 0.85  CALCIUM 8.9 8.6*   GFR: Estimated Creatinine Clearance: 73.6 mL/min (by C-G formula based on SCr of 0.85 mg/dL). Liver Function Tests: Recent Labs  Lab 06/24/18 1107 06/30/18 1151  AST 16 11*  ALT 10 8  ALKPHOS 63 52  BILITOT 0.3 0.6  PROT 6.6 6.4*  ALBUMIN 3.1* 3.0*   No results for input(s): LIPASE, AMYLASE in the last 168 hours. No results for input(s): AMMONIA in the last 168 hours. Coagulation Profile: No results for input(s): INR, PROTIME in the last 168 hours. Cardiac Enzymes: No results for input(s): CKTOTAL, CKMB, CKMBINDEX, TROPONINI in the last 168 hours. BNP (last 3 results) No results for input(s): PROBNP in the last 8760 hours. HbA1C: No results for input(s): HGBA1C in the last 72 hours. CBG: No results for input(s): GLUCAP in the last 168 hours. Lipid Profile: No results for input(s): CHOL, HDL, LDLCALC, TRIG, CHOLHDL, LDLDIRECT in the last 72 hours. Thyroid Function Tests: No results for input(s): TSH, T4TOTAL, FREET4, T3FREE, THYROIDAB in the last 72 hours. Anemia Panel: No results for input(s): VITAMINB12, FOLATE, FERRITIN, TIBC, IRON, RETICCTPCT in the last 72 hours. Urine analysis:    Component Value Date/Time   COLORURINE YELLOW 12/06/2014 2207   APPEARANCEUR CLOUDY (A) 12/06/2014  2207   LABSPEC 1.025 12/06/2014 2207   PHURINE 5.5 12/06/2014 2207   GLUCOSEU NEGATIVE 12/06/2014 2207   HGBUR MODERATE (A) 12/06/2014 2207   BILIRUBINUR NEGATIVE 12/06/2014 2207   KETONESUR NEGATIVE 12/06/2014 2207   PROTEINUR NEGATIVE 12/06/2014 2207  UROBILINOGEN 1.0 07/30/2013 1527   NITRITE POSITIVE (A) 12/06/2014 2207   LEUKOCYTESUR SMALL (A) 12/06/2014 2207    Radiological Exams on Admission: Dg Chest Port 1 View  Result Date: 06/30/2018 CLINICAL DATA:  Increased shortness of breath with decreased drainage from the PleurX catheter. History of metastatic lung cancer. EXAM: PORTABLE CHEST 1 VIEW COMPARISON:  PET-CT dated June 15, 2018. Chest x-ray dated Jun 03, 2018. FINDINGS: Unchanged right chest wall port catheter. Unchanged left PleurX catheter. Increasing size of the now large loculated left pleural effusion with likely complete collapse of the left lower lobe and partial collapse of the left upper lobe. The right lung is clear. No pneumothorax. Stable cardiomediastinal silhouette. No acute osseous abnormality. IMPRESSION: 1. Increasing now large loculated left pleural effusion with worsening aeration in the left lung. Electronically Signed   By: Titus Dubin M.D.   On: 06/30/2018 12:44    EKG: Independently reviewed.  Sinus tachycardia at 120 bpm.  No new acute ST-T wave changes.  Assessment/Plan Active Problems:   Breast cancer of upper-outer quadrant of left female breast ([facility])   Antineoplastic chemotherapy induced pancytopenia ([facility])   Malignant pleural effusion   Hypertension   Anxiety and depression   Loculated pleural effusion   Hypokalemia    Loculated pleural effusion: I will continue Zosyn and vancomycin and follow blood culture and tailor antibiotics.  I have requested EDP to consult IR so they can readjust her Pleurx catheter to see if her loculated pleural effusion is drainable.  Hypertension: We will resume home medications.  Anxiety/depression: Resume  home medications.  Hypokalemia: 3.4.  Will replace orally and recheck in the morning.  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  Consider consulting oncology if needed.  DVT prophylaxis: Lovenox Code Status: Full code Family Communication: None available at bedside.  Patient alert and oriented.  Discussed plan of care and admission plan with the patient in detail. Disposition Plan: Likely home in next 2 to 3 days Consults called: IR Admission status: Inpatient   Darliss Cheney MD [facility] Pager [phone]  If 7PM-7AM, please contact night-coverage [email or web address] Password [email or web address]  06/30/2018, 3:12 PM

## 2018-06-30 NOTE — ED Notes (Signed)
RN/MD notified of abnormal lab

## 2018-06-30 NOTE — ED Provider Notes (Signed)
[place] DEPT Provider Note   CSN: [id number] Arrival date & time: 06/30/18  1047    History   Chief Complaint Chief Complaint  Patient presents with  . Shortness of Breath    HPI Deborah Mcintyre is a 63 y.o. female with past medical history of COPD, left-sided breast cancer status post mastectomy with lymph node dissection, left-sided Pleurx, who presents today for evaluation of left-sided chest pain.  She reports that she has been more short of breath than usual since yesterday with increased coughing over the past 3 to 4 days.  She reports that she does not normally use oxygen, however since yesterday has been using 2 L nasal cannula as she feels less short of breath with it.  She reports feeling hot however denies any fevers.  She denies any urinary symptoms.  No nausea, vomiting, or diarrhea.  She denies any abdominal pain.    She is receiving palliative chemotherapy [place] with last dose 6 days ago.  She states that when she has been admitted before both morphine and fentanyl made her very sick and nauseous.  She reports that she tolerates Dilaudid well and requests that her pain medicine.  She has only had minimal output from her pleurx catheter which she reports is normal for her.      HPI  Past Medical History:  Diagnosis Date  . Anxiety   . Arthritis   . Bronchitis   . COPD (chronic obstructive pulmonary disease) ([place])   . Depression   . Hypertension   . Malignant neoplasm of upper-outer quadrant of left female breast ([place]) 01/04/2015  . Nocturia   . Numbness and tingling    toes - bilateral  . Pneumonia   . PONV (postoperative nausea and vomiting)    Nausea    Patient Active Problem List   Diagnosis Date Noted  . Port-A-Cath in place 06/24/2018  . Status post chest tube placement ([place])   . Tobacco abuse 06/01/2018  . Prolonged QT interval 06/01/2018  . Chest pain 06/01/2018  . Elevated troponin 06/01/2018  . Cigarette  smoker 05/27/2018  . Goals of care, counseling/discussion 05/19/2018  . 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  . Hypertension 05/12/2018  . COPD (chronic obstructive pulmonary disease) ([place]) 05/12/2018  . Anxiety and depression 05/12/2018  . Community acquired pneumonia of left lung 02/04/2017  . Antineoplastic chemotherapy induced pancytopenia ([place]) 04/26/2015  . Antineoplastic chemotherapy induced anemia 04/05/2015  . Breast cancer of upper-outer quadrant of left female breast ([place]) 01/01/2015  . Acute exacerbation of chronic bronchitis ([place]) 08/03/2013    Past Surgical History:  Procedure Laterality Date  . BREAST LUMPECTOMY Bilateral    x3  . BREAST LUMPECTOMY WITH RADIOACTIVE SEED LOCALIZATION Right 08/23/2015   Procedure: RIGHT BREAST LUMPECTOMY WITH RADIOACTIVE SEED LOCALIZATION;  Surgeon: Alphonsa Overall, MD;  Location: [facility] [facility];  Service: General;  Laterality: Right;  . CESAREAN SECTION     x4  . IR GUIDED DRAIN W CATHETER PLACEMENT  06/02/2018  . IR IMAGING GUIDED PORT INSERTION  05/25/2018  . IR REMOVAL TUN ACCESS W/ PORT W/O FL MOD SED  10/20/2016  . IR THORACENTESIS ASP PLEURAL SPACE W/IMG GUIDE  05/12/2018  . MASTECTOMY Left 08/23/2015    RIGHT BREAST LUMPECTOMY WITH RADIOACTIVE SEED LOCALIZATION,  LEFT MASTECTOMY WITH AXILLARY LYMPH NODE DISSECTION  . MASTECTOMY WITH AXILLARY LYMPH NODE DISSECTION Left 08/23/2015   Procedure: LEFT MASTECTOMY WITH AXILLARY LYMPH NODE DISSECTION;  Surgeon: Alphonsa Overall, MD;  Location: [place];  Service: General;  Laterality: Left;  [name] MULTIPLE TOOTH EXTRACTIONS       OB History   No obstetric history on file.      Home Medications    Prior to Admission medications   Medication Sig Start Date End Date Taking? Authorizing Provider  albuterol (PROVENTIL HFA;VENTOLIN HFA) 108 (90 Base) MCG/ACT inhaler Inhale 2 puffs into the lungs every 6 (six) hours as needed for wheezing or shortness of breath.    Yes [provider]  albuterol  (PROVENTIL) (2.5 MG/3ML) 0.083% nebulizer solution Take 3 mLs (2.5 mg total) by nebulization every 6 (six) hours as needed for wheezing or shortness of breath. 05/15/18  Yes Regalado, Belkys A, MD  ALPRAZolam (XANAX) 0.5 MG tablet Take 1 tablet (0.5 mg total) by mouth 3 (three) times daily as needed for anxiety or sleep. 05/15/18  Yes Regalado, Belkys A, MD  amLODipine (NORVASC) 5 MG tablet Take 1 tablet (5 mg total) by mouth daily. 05/16/18  Yes Regalado, Belkys A, MD  aspirin EC 81 MG tablet Take 81 mg by mouth daily.   Yes [provider]  dexamethasone (DECADRON) 1 MG tablet Take 1 tablet (1 mg total) by mouth daily. 06/17/18  Yes Nicholas Lose, MD  gabapentin (NEURONTIN) 300 MG capsule Take 1 capsule (300 mg total) by mouth 3 (three) times daily. 09/23/17  Yes Nicholas Lose, MD  lidocaine-prilocaine (EMLA) cream Apply to affected area once Patient taking differently: Apply 1 application topically daily as needed (port access).  05/19/18  Yes Nicholas Lose, MD  metoprolol tartrate (LOPRESSOR) 25 MG tablet Take 1 tablet (25 mg total) by mouth 2 (two) times daily. 06/02/18  Yes Georgette Shell, MD  ondansetron (ZOFRAN) 8 MG tablet Take 1 tablet (8 mg total) by mouth every 8 (eight) hours as needed for nausea. 09/23/17  Yes Nicholas Lose, MD  oxyCODONE-acetaminophen (PERCOCET/ROXICET) 5-325 MG tablet Take 1 tablet by mouth every 8 (eight) hours as needed for moderate pain or severe pain (1 tab every 4-6 hour prn pain). Patient taking differently: Take 1 tablet by mouth every 6 (six) hours as needed for moderate pain or severe pain.  06/17/18  Yes Nicholas Lose, MD  PARoxetine (PAXIL) 20 MG tablet Take 1 tablet (20 mg total) by mouth 2 (two) times daily. 05/15/18  Yes Regalado, Belkys A, MD  PREMARIN vaginal cream INSERT 1 APPLICATORFUL VAGINALLY DAILY Patient taking differently: Place 1 Applicatorful vaginally daily as needed (dryness).  04/19/17  Yes Nicholas Lose, MD  prochlorperazine  (COMPAZINE) 10 MG tablet Take 1 tablet (10 mg total) by mouth every 6 (six) hours as needed (Nausea or vomiting). Patient taking differently: Take 10 mg by mouth every 6 (six) hours as needed for nausea or vomiting.  05/19/18  Yes Nicholas Lose, MD  zolpidem (AMBIEN CR) 6.25 MG CR tablet Take 1 tablet (6.25 mg total) by mouth at bedtime as needed for sleep. 06/24/18  Yes Nicholas Lose, MD    Family History Family History  Problem Relation Age of Onset  . Breast cancer Maternal Aunt     Social History Social History   Tobacco Use  . Smoking status: Current Every Day Smoker    Packs/day: 0.75    Years: 45.00    Pack years: 33.75    Types: Cigarettes  . Smokeless tobacco: Never Used  Substance Use Topics  . Alcohol use: Yes    Comment: ocassionally  . Drug use: No     Allergies   Fentanyl and Morphine  and related   Review of Systems Review of Systems  Constitutional: Positive for appetite change and fatigue. Negative for chills and fever.  HENT: Negative for congestion.   Respiratory: Positive for cough and shortness of breath.   Cardiovascular: Positive for chest pain. Negative for palpitations and leg swelling.  Gastrointestinal: Negative for abdominal pain, diarrhea, nausea and vomiting.  Genitourinary: Negative for dysuria.  Musculoskeletal: Negative for back pain.  Allergic/Immunologic: Positive for immunocompromised state.  Neurological: Negative for weakness and headaches.  All other systems reviewed and are negative.    Physical Exam Updated Vital Signs BP (!) 143/87   Pulse (!) 114   Temp 99.8 F (37.7 C) (Rectal)   Resp 19   SpO2 99%   Physical Exam Vitals signs and nursing note reviewed.  Constitutional:      Appearance: She is ill-appearing.  HENT:     Head: Normocephalic and atraumatic.  Eyes:     Conjunctiva/sclera: Conjunctivae normal.  Neck:     Musculoskeletal: Neck supple.     Vascular: No JVD.     Trachea: No tracheal deviation.   Cardiovascular:     Rate and Rhythm: Normal rate and regular rhythm.     Heart sounds: No murmur.  Pulmonary:     Effort: Tachypnea (34/minute while in room) and respiratory distress (Mild when oxygen is off. ) present.     Breath sounds: Examination of the left-upper field reveals decreased breath sounds. Examination of the left-middle field reveals decreased breath sounds. Examination of the right-lower field reveals decreased breath sounds. Examination of the left-lower field reveals decreased breath sounds. Decreased breath sounds present.  Chest:     Chest wall: Tenderness present.  Abdominal:     Palpations: Abdomen is soft.     Tenderness: There is no abdominal tenderness.  Musculoskeletal:     Right lower leg: She exhibits no tenderness. No edema.     Left lower leg: She exhibits no tenderness. No edema.  Skin:    General: Skin is warm and dry.  Neurological:     General: No focal deficit present.     Mental Status: She is alert.  Psychiatric:        Mood and Affect: Mood normal.        Behavior: Behavior normal.       ED Treatments / Results  Labs (all labs ordered are listed, but only abnormal results are displayed) Labs Reviewed  COMPREHENSIVE METABOLIC PANEL - Abnormal; Notable for the following components:      Result Value   Potassium 3.3 (*)    Glucose, Bld 115 (*)    Calcium 8.6 (*)    Total Protein 6.4 (*)    Albumin 3.0 (*)    AST 11 (*)    All other components within normal limits  CBC WITH DIFFERENTIAL/PLATELET - Abnormal; Notable for the following components:   RBC 1.82 (*)    Hemoglobin 5.4 (*)    HCT 17.4 (*)    Lymphs Abs 0.5 (*)    Abs Immature Granulocytes 0.74 (*)    All other components within normal limits  HEMOGLOBIN AND HEMATOCRIT, BLOOD - Abnormal; Notable for the following components:   Hemoglobin 10.6 (*)    HCT 33.9 (*)    All other components within normal limits  SARS CORONAVIRUS 2 (HOSPITAL ORDER, [facility])  CULTURE, BLOOD (ROUTINE X 2)  CULTURE, BLOOD (ROUTINE X 2)  LACTIC ACID, PLASMA  LACTIC ACID, PLASMA  TROPONIN I (HIGH SENSITIVITY)  URINALYSIS, ROUTINE W REFLEX MICROSCOPIC  TROPONIN I (HIGH SENSITIVITY)  HEMOGLOBIN AND HEMATOCRIT, BLOOD  POC OCCULT BLOOD, ED  TYPE AND SCREEN  PREPARE RBC (CROSSMATCH)    EKG EKG Interpretation  Date/Time:  Thursday June 30 2018 11:16:46 EDT Ventricular Rate:  120 PR Interval:    QRS Duration: 143 QT Interval:  371 QTC Calculation: 525 R Axis:   109 Text Interpretation:  Sinus tachycardia Probable left atrial enlargement RBBB and LPFB Confirmed by Virgel Manifold [phone]) on 06/30/2018 12:28:55 PM   Radiology Dg Chest Port 1 View  Result Date: 06/30/2018 CLINICAL DATA:  Increased shortness of breath with decreased drainage from the PleurX catheter. History of metastatic lung cancer. EXAM: PORTABLE CHEST 1 VIEW COMPARISON:  PET-CT dated June 15, 2018. Chest x-ray dated Jun 03, 2018. FINDINGS: Unchanged right chest wall port catheter. Unchanged left PleurX catheter. Increasing size of the now large loculated left pleural effusion with likely complete collapse of the left lower lobe and partial collapse of the left upper lobe. The right lung is clear. No pneumothorax. Stable cardiomediastinal silhouette. No acute osseous abnormality. IMPRESSION: 1. Increasing now large loculated left pleural effusion with worsening aeration in the left lung. Electronically Signed   By: Titus Dubin M.D.   On: 06/30/2018 12:44    Procedures Procedures (including critical care time)  Medications Ordered in ED Medications  0.9 %  sodium chloride infusion (has no administration in time range)  ceFEPIme (MAXIPIME) 2 g in sodium chloride 0.9 % 100 mL IVPB (0 g Intravenous Stopped 06/30/18 1330)  metroNIDAZOLE (FLAGYL) IVPB 500 mg (500 mg Intravenous New Bag/Given 06/30/18 1333)  vancomycin (VANCOCIN) IVPB 1000 mg/200 mL premix (0 mg Intravenous Stopped  06/30/18 1346)  HYDROmorphone (DILAUDID) injection 0.5 mg (0.5 mg Intravenous Given 06/30/18 1236)  sodium chloride 0.9 % bolus 500 mL (500 mLs Intravenous New Bag/Given 06/30/18 1333)     Initial Impression / Assessment and Plan / ED Course  I have reviewed the triage vital signs and the nursing notes.  Pertinent labs & imaging results that were available during my care of the patient were reviewed by me and considered in my medical decision making (see chart for details).  Clinical Course as of Jun 29 1452  Thu Jun 30, 2018  1249 Was informed that her hemoglobin is under 6.  Chart review shows she runs about 13.  Will wait on results of WBC count, consider rectal exam after if not neutropenic.     [EH]  1253 WBC and other cell lines appear to be at her baseline.  Will obtain heme occult, type and screen.    Hemoglobin(!!): 5.4 [EH]  1343 Patient was not given 80ml/kg as her lactic was under 4 and she was not hypotensive.    [EH]  1433 Spoke with blood bank, informed them to cancel transfusion, continue with type and screen as repeat hemoglobin is 10.6.  Will order a repeat H&H for a tie breaker.   [EH]  1434 Hemoglobin(!): 10.6 [EH]    Clinical Course User Index [EH] Lorin Glass, PA-C      Patient presents today for evaluation of chest pain/shortness of breath.  Her symptoms got significantly worse today however she noted that she needed her oxygen starting yesterday.  On initial evaluation she is tachycardic into the 120s, tachypneic at 34 while I was in the room.  She reports that she has been getting decreased drainage out of her Pleurx catheter, over the  past few weeks normally gets 100 out per week and did not have significant output when she checked earlier today.  As she is receiving chemotherapy with her tachycardia and tachypnea she meets sirs criteria.  Question pneumonia/sepsis versus reaccumulation of pleural fluid.  Code sepsis was called based on her  immunocompromise status and vitals and she was started on broad-spectrum antibiotics.  Labs are obtained and reviewed, initial CBC shows a hemoglobin of 5.4 with normal platelet and white cell count with a left shift.  Blood cultures were obtained.  Lactic acid is not elevated at 1.3.  Chest x-ray shows worsening pleural effusion with concern for loculation.    Her troponin is not elevated.  She had a temperature of 99.8 rectally.  Given the significant change in her hemoglobin over 6 days repeat H&H was ordered with a hemoglobin of 10.6.  I called blood bank to cancel the transfusion.  I placed an order for a third H&H hoping to get a better idea of where her Hemoglobin truly is.  I Spoke with hosptialist who agreed to admit patient.  Order placed for IR eval and treat per request of hospitalist.    This patient was seen as a shared visit with Dr. Wilson Singer.    Patient will be admitted to the hospital.   Final Clinical Impressions(s) / ED Diagnoses   Final diagnoses:  Dyspnea, unspecified type  Loculated pleural effusion  Malignant pleural effusion    ED Discharge Orders    None       Ollen Gross 06/30/18 1457    Virgel Manifold, MD 07/03/18 Lurena Nida

## 2018-06-30 NOTE — Progress Notes (Signed)
A consult was received from an ED physician for vancomycin and cefepime per pharmacy dosing.  The patient's profile has been reviewed for ht/wt/allergies/indication/available labs.    A one time order has been placed for vancomycin 1gm and cefepime 2gm.  Further antibiotics/pharmacy consults should be ordered by admitting physician if indicated.                       Thank you, Lynelle Doctor 06/30/2018  11:45 AM

## 2018-06-30 NOTE — ED Notes (Signed)
Pure wick has been placed. Suction set to 45mmHg.  

## 2018-06-30 NOTE — ED Triage Notes (Signed)
She c/o shortness of breath. She has left metastatic lung cancer with effusion and has a Pleurex catheter. She tells me she has been obtaining diminished amounts of effusion fluid when draining her Pleurex. She states she has obtained ~50 ml of pleural fluid today upon draining it. She alsco c/o lateral left thoracic and pectoral pain, which she states "just feels like the pain from the cancer". EKG performed upon arrival. She is in no distress. Room air SPO2 obtained by EMS is 98%. She is on L2 per her request for "comfort".

## 2018-06-30 NOTE — Progress Notes (Signed)
Pharmacy Antibiotic Note  Deborah Mcintyre is a 63 y.o. female admitted on 06/30/2018 with loculated pleural effusion.  Pharmacy has been consulted for vanc/zosyn dosing.  Plan:  Vanc 1g IV x 1 given at 1237. Start vanc 1500mg  IV q24 thereafter. Will start prior to 24hr to offset loading dose not given  Zosyn 3.375g IV q8 (extended interval infusion)  Daily SCr    Temp (24hrs), Avg:98.8 F (37.1 C), Min:97.7 F (36.5 C), Max:99.8 F (37.7 C)  Recent Labs  Lab 06/24/18 1107 06/30/18 1151 06/30/18 1324  WBC 4.9 4.9  --   CREATININE 0.75 0.85  --   LATICACIDVEN  --  1.3 1.1    Estimated Creatinine Clearance: 73.6 mL/min (by C-G formula based on SCr of 0.85 mg/dL).    Allergies  Allergen Reactions  . Fentanyl Nausea And Vomiting and Other (See Comments)    Headache  . Morphine And Related Nausea And Vomiting and Other (See Comments)    Headache   Thank you for allowing pharmacy to be a part of this patient's care.  Kara Mead 06/30/2018 3:14 PM

## 2018-06-30 NOTE — ED Notes (Signed)
ED TO INPATIENT HANDOFF REPORT  Name/Age/Gender Deborah Mcintyre 63 y.o. female  Code Status Code Status History    Date Active Date Inactive Code Status Order ID Comments User Context   06/01/2018 2227 06/03/2018 1746 Full Code [id number]  Toy Baker, MD Inpatient   05/27/2018 2223 05/29/2018 1957 Full Code [id number]  Gwynne Edinger, MD Inpatient   05/12/2018 0220 05/15/2018 1922 DNR [id number]  Lenore Cordia, MD ED   02/04/2017 2236 02/10/2017 1447 Full Code [id number]  [name], MD ED   08/23/2015 1551 08/24/2015 1244 Full Code [id number]  Alphonsa Overall, MD Inpatient   08/03/2013 2300 08/07/2013 1602 Full Code [id number]  [name], MD ED   Advance Care Planning Activity      Home/SNF/Other Home  Chief Complaint Shortness of Breath  Level of Care/Admitting Diagnosis ED Disposition    ED Disposition Condition [place]: [facility] [[id number]]  Level of Care: Telemetry [5]  Admit to tele based on following criteria: Other see comments  Comments: loculated pleural effusion  Covid Evaluation: Confirmed COVID Negative  Diagnosis: Loculated pleural effusion [836629]  Admitting Physician: Darliss Cheney [[id number]]  Attending Physician: Darliss Cheney [phone]  Estimated length of stay: 3 - 4 days  Certification:: I certify this patient will need inpatient services for at least 2 midnights  PT Class (Do Not Modify): Inpatient [101]  PT Acc Code (Do Not Modify): Private [1]       Medical History Past Medical History:  Diagnosis Date  . Anxiety   . Arthritis   . Bronchitis   . COPD (chronic obstructive pulmonary disease) ([place])   . Depression   . Hypertension   . Malignant neoplasm of upper-outer quadrant of left female breast ([place]) 01/04/2015  . Nocturia   . Numbness and tingling    toes - bilateral  . Pneumonia   . PONV (postoperative nausea and vomiting)    Nausea    Allergies Allergies  Allergen Reactions  .  Fentanyl Nausea And Vomiting and Other (See Comments)    Headache  . Morphine And Related Nausea And Vomiting and Other (See Comments)    Headache    IV Location/Drains/Wounds Patient Lines/Drains/Airways Status   Active Line/Drains/Airways    Name:   Placement date:   Placement time:   Site:   Days:   Implanted Port 05/25/18 Right Chest   05/25/18    1257    Chest   36   Chest Tube 1 Left;Lateral;Anterior Pleural 15.5 Fr.   06/02/18    1340    Pleural   28   Closed System Drain 1 Left;Ventral;Lateral Chest Bulb (JP) 9 Fr.   08/23/15    1242    Chest   1042   Closed System Drain 2 Left;Ventral;Midline Chest Bulb (JP) 9 Fr.   08/23/15    1243    Chest   1042   External Urinary Catheter   06/30/18    1113    -   less than 1   Incision (Closed) 08/23/15 Breast Bilateral   08/23/15    1241     1042          Labs/Imaging Results for orders placed or performed during the hospital encounter of 06/30/18 (from the past 48 hour(s))  Lactic acid, plasma     Status: None   Collection Time: 06/30/18 11:51 AM  Result Value Ref Range   Lactic Acid, Venous 1.3 0.5 - 1.9 mmol/L  Comment: Performed at [facility], [place] [address] [place], [place] [address]  Comprehensive metabolic panel     Status: Abnormal   Collection Time: 06/30/18 11:51 AM  Result Value Ref Range   Sodium 140 135 - 145 mmol/L   Potassium 3.3 (L) 3.5 - 5.1 mmol/L   Chloride 101 98 - 111 mmol/L   CO2 27 22 - 32 mmol/L   Glucose, Bld 115 (H) 70 - 99 mg/dL   BUN 10 8 - 23 mg/dL   Creatinine, Ser 0.85 0.44 - 1.00 mg/dL   Calcium 8.6 (L) 8.9 - 10.3 mg/dL   Total Protein 6.4 (L) 6.5 - 8.1 g/dL   Albumin 3.0 (L) 3.5 - 5.0 g/dL   AST 11 (L) 15 - 41 U/L   ALT 8 0 - 44 U/L   Alkaline Phosphatase 52 38 - 126 U/L   Total Bilirubin 0.6 0.3 - 1.2 mg/dL   GFR calc non Af Amer >60 >60 mL/min   GFR calc Af Amer >60 >60 mL/min   Anion gap 12 5 - 15    Comment: Performed at [facility], [place]  [address] [place], [place] [address]  CBC WITH DIFFERENTIAL     Status: Abnormal   Collection Time: 06/30/18 11:51 AM  Result Value Ref Range   WBC 4.9 4.0 - 10.5 K/uL    Comment: REPEATED TO VERIFY WHITE COUNT CONFIRMED ON SMEAR QUESTIONABLE SPECIMEN INTEGRITY, SUGGEST RECOLLECT.    RBC 1.82 (L) 3.87 - 5.11 MIL/uL    Comment: QUESTIONABLE SPECIMEN INTEGRITY, SUGGEST RECOLLECT.   Hemoglobin 5.4 (LL) 12.0 - 15.0 g/dL    Comment: REPEATED TO VERIFY THIS CRITICAL RESULT HAS VERIFIED AND BEEN CALLED TO STACY WEST,RN BY MARVIN SCOTTON ON 06 25 2020 AT 1249, AND HAS BEEN READ BACK.  QUESTIONABLE SPECIMEN INTEGRITY, SUGGEST RECOLLECT. CORRECTED ON 06/25 AT 1454: PREVIOUSLY REPORTED AS 5.4 REPEATED TO VERIFY THIS CRITICAL RESULT HAS VERIFIED AND BEEN CALLED TO STACY WEST,RN BY MARVIN SCOTTON ON 06 25 2020 AT 1249, AND HAS BEEN READ BACK.     HCT 17.4 (L) 36.0 - 46.0 %    Comment: QUESTIONABLE SPECIMEN INTEGRITY, SUGGEST RECOLLECT.   MCV 95.6 80.0 - 100.0 fL    Comment: QUESTIONABLE SPECIMEN INTEGRITY, SUGGEST RECOLLECT.   MCH 29.7 26.0 - 34.0 pg    Comment: QUESTIONABLE SPECIMEN INTEGRITY, SUGGEST RECOLLECT.   MCHC 31.0 30.0 - 36.0 g/dL    Comment: QUESTIONABLE SPECIMEN INTEGRITY, SUGGEST RECOLLECT.   RDW 13.3 11.5 - 15.5 %    Comment: QUESTIONABLE SPECIMEN INTEGRITY, SUGGEST RECOLLECT.   Platelets 379 150 - 400 K/uL    Comment: QUESTIONABLE SPECIMEN INTEGRITY, SUGGEST RECOLLECT.   nRBC 0.0 0.0 - 0.2 %    Comment: QUESTIONABLE SPECIMEN INTEGRITY, SUGGEST RECOLLECT.   Neutrophils Relative % 63 %    Comment: QUESTIONABLE SPECIMEN INTEGRITY, SUGGEST RECOLLECT.   Neutro Abs 3.1 1.7 - 7.7 K/uL    Comment: QUESTIONABLE SPECIMEN INTEGRITY, SUGGEST RECOLLECT.   Lymphocytes Relative 11 %    Comment: QUESTIONABLE SPECIMEN INTEGRITY, SUGGEST RECOLLECT.   Lymphs Abs 0.5 (L) 0.7 - 4.0 K/uL    Comment: QUESTIONABLE SPECIMEN INTEGRITY, SUGGEST RECOLLECT.   Monocytes Relative 10 %    Comment:  QUESTIONABLE SPECIMEN INTEGRITY, SUGGEST RECOLLECT.   Monocytes Absolute 0.5 0.1 - 1.0 K/uL    Comment: QUESTIONABLE SPECIMEN INTEGRITY, SUGGEST RECOLLECT.   Eosinophils Relative 0 %    Comment: QUESTIONABLE SPECIMEN INTEGRITY, SUGGEST RECOLLECT.   Eosinophils Absolute 0.0 0.0 -  0.5 K/uL    Comment: QUESTIONABLE SPECIMEN INTEGRITY, SUGGEST RECOLLECT.   Basophils Relative 1 %    Comment: QUESTIONABLE SPECIMEN INTEGRITY, SUGGEST RECOLLECT.   Basophils Absolute 0.0 0.0 - 0.1 K/uL    Comment: QUESTIONABLE SPECIMEN INTEGRITY, SUGGEST RECOLLECT.   WBC Morphology MILD LEFT SHIFT (1-5% METAS, OCC MYELO, OCC BANDS)    Immature Granulocytes 15 %   Abs Immature Granulocytes 0.74 (H) 0.00 - 0.07 K/uL    Comment: Performed at [facility], [place] [address] [place], [place] [address]  SARS Coronavirus 2 (CEPHEID- Performed in [place] hospital lab), Hosp Order     Status: None   Collection Time: 06/30/18 11:51 AM   Specimen: Nasopharyngeal Swab  Result Value Ref Range   SARS Coronavirus 2 NEGATIVE NEGATIVE    Comment: (NOTE) If result is NEGATIVE SARS-CoV-2 target nucleic acids are NOT DETECTED. The SARS-CoV-2 RNA is generally detectable in upper and lower  respiratory specimens during the acute phase of infection. The lowest  concentration of SARS-CoV-2 viral copies this assay can detect is 250  copies / mL. A negative result does not preclude SARS-CoV-2 infection  and should not be used as the sole basis for treatment or other  patient management decisions.  A negative result may occur with  improper specimen collection / handling, submission of specimen other  than nasopharyngeal swab, presence of viral mutation(s) within the  areas targeted by this assay, and inadequate number of viral copies  (<250 copies / mL). A negative result must be combined with clinical  observations, patient history, and epidemiological information. If result is POSITIVE SARS-CoV-2 target  nucleic acids are DETECTED. The SARS-CoV-2 RNA is generally detectable in upper and lower  respiratory specimens dur ing the acute phase of infection.  Positive  results are indicative of active infection with SARS-CoV-2.  Clinical  correlation with patient history and other diagnostic information is  necessary to determine patient infection status.  Positive results do  not rule out bacterial infection or co-infection with other viruses. If result is PRESUMPTIVE POSTIVE SARS-CoV-2 nucleic acids MAY BE PRESENT.   A presumptive positive result was obtained on the submitted specimen  and confirmed on repeat testing.  While 2019 novel coronavirus  (SARS-CoV-2) nucleic acids may be present in the submitted sample  additional confirmatory testing may be necessary for epidemiological  and / or clinical management purposes  to differentiate between  SARS-CoV-2 and other Sarbecovirus currently known to infect humans.  If clinically indicated additional testing with an alternate test  methodology [id number]) is advised. The SARS-CoV-2 RNA is generally  detectable in upper and lower respiratory sp ecimens during the acute  phase of infection. The expected result is Negative. Fact Sheet for Patients:  StrictlyIdeas.no Fact Sheet for Healthcare Providers: BankingDealers.co.za This test is not yet approved or cleared by the [place] FDA and has been authorized for detection and/or diagnosis of SARS-CoV-2 by FDA under an Emergency Use Authorization (EUA).  This EUA will remain in effect (meaning this test can be used) for the duration of the COVID-19 declaration under Section 564(b)(1) of the Act, 21 U.S.C. section 360bbb-3(b)(1), unless the authorization is terminated or revoked sooner. Performed at [facility], [place] [address], [place], [place] [address]   Troponin I (High Sensitivity)     Status: None   Collection Time:  06/30/18 11:51 AM  Result Value Ref Range   Troponin I (High Sensitivity) 7.0 <18 ng/L    Comment: (NOTE) Elevated high  sensitivity troponin I (hsTnI) values and significant  changes across serial measurements may suggest ACS but many other  chronic and acute conditions are known to elevate hsTnI results.  Refer to the "Links" section for chest pain algorithms and additional  guidance. Performed at [facility], [place] [address], [place], [place] [address]   Lactic acid, plasma     Status: None   Collection Time: 06/30/18  1:24 PM  Result Value Ref Range   Lactic Acid, Venous 1.1 0.5 - 1.9 mmol/L    Comment: Performed at [facility], [place] [address] [place], [place] [address]  Type and screen [place]     Status: None (Preliminary result)   Collection Time: 06/30/18  1:24 PM  Result Value Ref Range   ABO/RH(D) PENDING    Antibody Screen PENDING    Sample Expiration      07/03/2018,2359 Performed at [facility], [place] [address] [place], [place] [address]   Hemoglobin and hematocrit, blood     Status: Abnormal   Collection Time: 06/30/18  1:55 PM  Result Value Ref Range   Hemoglobin 10.6 (L) 12.0 - 15.0 g/dL    Comment: REPEATED TO VERIFY PREVIOUS SAMPLE QUESTIONABLE, NOTIFIED ZULETTA,C. RN @1427  ON 06.25.2020 BY COHEN,K DELTA CHECK NOTED CORRECTED ON 06/25 AT 1431: PREVIOUSLY REPORTED AS 10.6 REPEATED TO VERIFY PREVIOUS SAMPLE QUESTIONABLE, NOTIFIED ZULETTA,C. RN @1427  ON 06.25.2020 BY COHEN,K    HCT 33.9 (L) 36.0 - 46.0 %    Comment: Performed at [facility], [place] [address] [place], [place] [address]  Hemoglobin and hematocrit, blood     Status: Abnormal   Collection Time: 06/30/18  2:37 PM  Result Value Ref Range   Hemoglobin 10.7 (L) 12.0 - 15.0 g/dL   HCT 34.3 (L) 36.0 - 46.0 %    Comment: Performed at [facility], [place] [address], [place], [place]  [address]   Dg Chest Port 1 View  Result Date: 06/30/2018 CLINICAL DATA:  Increased shortness of breath with decreased drainage from the PleurX catheter. History of metastatic lung cancer. EXAM: PORTABLE CHEST 1 VIEW COMPARISON:  PET-CT dated June 15, 2018. Chest x-ray dated Jun 03, 2018. FINDINGS: Unchanged right chest wall port catheter. Unchanged left PleurX catheter. Increasing size of the now large loculated left pleural effusion with likely complete collapse of the left lower lobe and partial collapse of the left upper lobe. The right lung is clear. No pneumothorax. Stable cardiomediastinal silhouette. No acute osseous abnormality. IMPRESSION: 1. Increasing now large loculated left pleural effusion with worsening aeration in the left lung. Electronically Signed   By: Titus Dubin M.D.   On: 06/30/2018 12:44    Pending Labs Unresulted Labs (From admission, onward)    Start     Ordered   07/01/18 0500  Creatinine, serum  Daily,   R     06/30/18 1518   06/30/18 1255  Prepare RBC  (Adult Blood Administration - PRBC)  Once,   R    Question Answer Comment  # of Units 2 units   Transfusion Indications Symptomatic Anemia   If emergent release call blood bank Not emergent release      06/30/18 1255   06/30/18 1237  Troponin I (High Sensitivity)  STAT Now then every 2 hours,   STAT     06/30/18 1236   06/30/18 1127  Blood Culture (routine x 2)  BLOOD CULTURE X 2,   STAT  06/30/18 1130   06/30/18 1127  Urinalysis, Routine w reflex microscopic  ONCE - STAT,   STAT     06/30/18 1130   Signed and Held  CBC  (enoxaparin (LOVENOX)    CrCl >/= 30 ml/min)  Once,   R    Comments: Baseline for enoxaparin therapy IF NOT ALREADY DRAWN.  Notify MD if PLT < 100 K.    Signed and Held   Signed and Held  Creatinine, serum  (enoxaparin (LOVENOX)    CrCl >/= 30 ml/min)  Once,   R    Comments: Baseline for enoxaparin therapy IF NOT ALREADY DRAWN.    Signed and Held   Signed and Held  Creatinine, serum   (enoxaparin (LOVENOX)    CrCl >/= 30 ml/min)  Weekly,   R    Comments: while on enoxaparin therapy    Signed and Held   Signed and Held  Magnesium  Once,   R     Signed and Held   Signed and Held  Comprehensive metabolic panel  Tomorrow morning,   R     Signed and Held   Signed and Held  CBC  Tomorrow morning,   R     Signed and Held          Vitals/Pain Today's Vitals   06/30/18 1111 06/30/18 1112 06/30/18 1156 06/30/18 1300  BP:  130/68  (!) 143/87  Pulse:  (!) 121  (!) 114  Resp:  (!) 24  19  Temp:  97.7 F (36.5 C) 99.8 F (37.7 C)   TempSrc:  Oral Rectal   SpO2:  100%  99%  PainSc: 5        Isolation Precautions No active isolations  Medications Medications  0.9 %  sodium chloride infusion (has no administration in time range)  potassium chloride SA (K-DUR) CR tablet 40 mEq (has no administration in time range)  vancomycin (VANCOCIN) 1,500 mg in sodium chloride 0.9 % 500 mL IVPB (has no administration in time range)  piperacillin-tazobactam (ZOSYN) IVPB 3.375 g (has no administration in time range)  ceFEPIme (MAXIPIME) 2 g in sodium chloride 0.9 % 100 mL IVPB (0 g Intravenous Stopped 06/30/18 1330)  metroNIDAZOLE (FLAGYL) IVPB 500 mg (0 mg Intravenous Stopped 06/30/18 1517)  vancomycin (VANCOCIN) IVPB 1000 mg/200 mL premix (0 mg Intravenous Stopped 06/30/18 1346)  HYDROmorphone (DILAUDID) injection 0.5 mg (0.5 mg Intravenous Given 06/30/18 1236)  sodium chloride 0.9 % bolus 500 mL (0 mLs Intravenous Stopped 06/30/18 1517)    Mobility walks

## 2018-07-01 ENCOUNTER — Other Ambulatory Visit: Payer: Self-pay

## 2018-07-01 DIAGNOSIS — J910 Malignant pleural effusion: Secondary | ICD-10-CM

## 2018-07-01 LAB — COMPREHENSIVE METABOLIC PANEL
ALT: 9 U/L (ref 0–44)
AST: 13 U/L — ABNORMAL LOW (ref 15–41)
Albumin: 2.5 g/dL — ABNORMAL LOW (ref 3.5–5.0)
Alkaline Phosphatase: 45 U/L (ref 38–126)
Anion gap: 9 (ref 5–15)
BUN: 11 mg/dL (ref 8–23)
CO2: 27 mmol/L (ref 22–32)
Calcium: 8.5 mg/dL — ABNORMAL LOW (ref 8.9–10.3)
Chloride: 102 mmol/L (ref 98–111)
Creatinine, Ser: 0.67 mg/dL (ref 0.44–1.00)
GFR calc Af Amer: >60 mL/min (ref >60)
GFR calc non Af Amer: >60 mL/min (ref >60)
Glucose, Bld: 90 mg/dL (ref 70–99)
Potassium: 3.6 mmol/L (ref 3.5–5.1)
Sodium: 138 mmol/L (ref 135–145)
Total Bilirubin: 0.5 mg/dL (ref 0.3–1.2)
Total Protein: 5.8 g/dL — ABNORMAL LOW (ref 6.5–8.1)

## 2018-07-01 LAB — ABO/RH: ABO/RH(D): A POS

## 2018-07-01 LAB — CBC
HCT: 32.4 % — ABNORMAL LOW (ref 36.0–46.0)
Hemoglobin: 10.1 g/dL — ABNORMAL LOW (ref 12.0–15.0)
MCH: 29.4 pg (ref 26.0–34.0)
MCHC: 31.2 g/dL (ref 30.0–36.0)
MCV: 94.2 fL (ref 80.0–100.0)
Platelets: 250 10*3/uL (ref 150–400)
RBC: 3.44 MIL/uL — ABNORMAL LOW (ref 3.87–5.11)
RDW: 13.4 % (ref 11.5–15.5)
WBC: 1.8 10*3/uL — ABNORMAL LOW (ref 4.0–10.5)
nRBC: 0 % (ref 0.0–0.2)

## 2018-07-01 MED ORDER — HYDROMORPHONE HCL 1 MG/ML IJ SOLN
0.5000 mg | Freq: Three times a day (TID) | INTRAMUSCULAR | Status: AC | PRN
  Administered 2018-07-01 – 2018-07-02 (×3): 0.5 mg via INTRAVENOUS
  Filled 2018-07-01 (×3): qty 0.5

## 2018-07-01 MED ORDER — SODIUM CHLORIDE 0.9% FLUSH: 10.0000 mL | INTRAVENOUS | Status: DC | PRN

## 2018-07-01 MED ORDER — SODIUM CHLORIDE (PF) 0.9 % IJ SOLN
10.0000 mg | Freq: Two times a day (BID) | INTRAMUSCULAR | Status: DC
  Filled 2018-07-01: qty 10

## 2018-07-01 MED ORDER — SODIUM CHLORIDE (PF) 0.9 % IJ SOLN: 6.0000 mg | Freq: Two times a day (BID) | INTRAMUSCULAR | Status: DC

## 2018-07-01 MED ORDER — SENNOSIDES-DOCUSATE SODIUM 8.6-50 MG PO TABS
1.0000 | ORAL_TABLET | Freq: Every day | ORAL | Status: DC
  Administered 2018-07-01: 1 via ORAL
  Filled 2018-07-01: qty 1

## 2018-07-01 MED ORDER — SODIUM CHLORIDE 0.9% FLUSH
10.0000 mL | Freq: Two times a day (BID) | INTRAVENOUS | Status: DC
  Administered 2018-07-01 – 2018-07-02 (×2): 10 mL

## 2018-07-01 MED ORDER — STERILE WATER FOR INJECTION IJ SOLN
5.0000 mg | Freq: Two times a day (BID) | RESPIRATORY_TRACT | Status: DC
  Filled 2018-07-01: qty 5

## 2018-07-01 MED ORDER — POLYETHYLENE GLYCOL 3350 17 G PO PACK
17.0000 g | PACK | Freq: Every day | ORAL | Status: DC
  Administered 2018-07-01: 17 g via ORAL
  Filled 2018-07-01 (×2): qty 1

## 2018-07-01 MED ORDER — SODIUM CHLORIDE (PF) 0.9 % IJ SOLN
6.0000 mg | Freq: Two times a day (BID) | INTRAMUSCULAR | Status: DC
  Administered 2018-07-01: 6 mg via INTRAPLEURAL
  Filled 2018-07-01: qty 6

## 2018-07-01 MED ORDER — OXYCODONE-ACETAMINOPHEN 5-325 MG PO TABS
1.0000 | ORAL_TABLET | ORAL | Status: DC | PRN
  Administered 2018-07-02 (×3): 1 via ORAL
  Filled 2018-07-01 (×3): qty 1

## 2018-07-01 NOTE — Progress Notes (Signed)
PROGRESS NOTE  Deborah Mcintyre WNI:[id number] DOB: Oct 06, 1955 DOA: 06/30/2018 PCP: [name], MD  HPI/Recap of past 24 hours: HPI from Dr Kalman Drape Deborah Mcintyre is a 63 y.o. 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  According to patient, she has been having nonproductive cough as well as shortness of breath which gets worse since about 2 days.  She also has been having chronic left anterior chest pain which is constant and achy with no aggravating or relieving factor.  She normally does not use any oxygen at home however since past 2 days, she has been using 2 L of oxygen with minimal relief.  She denies any fever, nausea, vomiting, any problem with urination or with bowel movement.  No sick contact or any recent travel.  According to patient, she drains Pleurx catheter about every third day and usually gets 150 mL output since last few days, she has been getting only about 50 mL out. In the ED, she was tachycardic, tachypneic and hypoxic. CBC revealed leukocytosis. Lactic acid within normal limits. Chest x-ray shows loculated left-sided pleural effusion worsened than before.  She was given vancomycin, Zosyn and Flagyl in the emergency department and hospital service was consulted.  IR has been consulted per my request.   Today, patient denies any worsening complaints.  Still reports shortness of breath, with pain around the Pleurx catheter.  Still with nonproductive cough.  Patient denies any fever/chills, abdominal pain, chest pain.  Assessment/Plan: Active Problems:   Breast cancer of upper-outer quadrant of left female breast (HCC)   Antineoplastic chemotherapy induced pancytopenia (HCC)   Malignant pleural effusion   Hypertension   Anxiety and depression   Loculated pleural effusion   Hypokalemia  Recurrent malignant pleural effusion s/p PleurX catheter  Currently afebrile, with leukopenia BC x2 NGTD Chest x-ray showed worsening loculated left sided pleural effusion Chest CT showed enlarging loculated left pleural effusion with increasing partial collapse of the left lower lobe Continue IV Zosyn, vancomycin IR consulted for management of possible clogged Pleurx catheter, appreciate recs Pain management Monitor closely  Leukopenia/normocytic anemia Likely secondary to chemotherapy Monitor closely, daily CBC  Hypertension Continue home medications.  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 Lindi Adie about admission, will see pt       Malnutrition Type:      Malnutrition Characteristics:      Nutrition Interventions:       Estimated body mass index is 26.88 kg/m as calculated from the following:   Height as of 06/24/18: 5' 7.5" (1.715 m).   Weight as of this encounter: 79 kg.     Code Status: Full  Family Communication: None at bedside  Disposition Plan: Once management is complete, back to home   Consultants:  IR  Procedures:  None  Antimicrobials:  Zosyn  Vancomycin  DVT prophylaxis: Lovenox   Objective: Vitals:   07/01/18 0700 07/01/18 0820 07/01/18 1321 07/01/18 1414  BP:    112/78  Pulse:    94  Resp:    20  Temp:    97.8 F (36.6 C)  TempSrc:    Oral  SpO2:  95% 99% 96%  Weight: 79 kg       Intake/Output Summary (Last  24 hours) at 07/01/2018 1414 Last data filed at 07/01/2018 0900 Gross per 24 hour  Intake 1522.46 ml  Output -  Net 1522.46 ml   Filed Weights   07/01/18 0700  Weight: 79 kg    Exam:  General: NAD   Cardiovascular: S1, S2 present  Respiratory: Diminished breath sounds on the left, Pleurx catheter noted  Abdomen: Soft,  nontender, nondistended, bowel sounds present  Musculoskeletal: No bilateral pedal edema noted  Skin: Normal  Psychiatry: Normal mood   Data Reviewed: CBC: Recent Labs  Lab 06/30/18 1151 06/30/18 1355 06/30/18 1437 07/01/18 0617  WBC 4.9  --   --  1.8*  NEUTROABS 3.1  --   --   --   HGB 5.4* 10.6* 10.7* 10.1*  HCT 17.4* 33.9* 34.3* 32.4*  MCV 95.6  --   --  94.2  PLT 379  --   --  196   Basic Metabolic Panel: Recent Labs  Lab 06/30/18 1151 06/30/18 1437 07/01/18 0617  NA 140  --  138  K 3.3*  --  3.6  CL 101  --  102  CO2 27  --  27  GLUCOSE 115*  --  90  BUN 10  --  11  CREATININE 0.85  --  0.67  CALCIUM 8.6*  --  8.5*  MG  --  1.9  --    GFR: Estimated Creatinine Clearance: 78.7 mL/min (by C-G formula based on SCr of 0.67 mg/dL). Liver Function Tests: Recent Labs  Lab 06/30/18 1151 07/01/18 0617  AST 11* 13*  ALT 8 9  ALKPHOS 52 45  BILITOT 0.6 0.5  PROT 6.4* 5.8*  ALBUMIN 3.0* 2.5*   No results for input(s): LIPASE, AMYLASE in the last 168 hours. No results for input(s): AMMONIA in the last 168 hours. Coagulation Profile: No results for input(s): INR, PROTIME in the last 168 hours. Cardiac Enzymes: No results for input(s): CKTOTAL, CKMB, CKMBINDEX, TROPONINI in the last 168 hours. BNP (last 3 results) No results for input(s): PROBNP in the last 8760 hours. HbA1C: No results for input(s): HGBA1C in the last 72 hours. CBG: No results for input(s): GLUCAP in the last 168 hours. Lipid Profile: No results for input(s): CHOL, HDL, LDLCALC, TRIG, CHOLHDL, LDLDIRECT in the last 72 hours. Thyroid Function Tests: No results for input(s): TSH, T4TOTAL, FREET4, T3FREE, THYROIDAB in the last 72 hours. Anemia Panel: No results for input(s): VITAMINB12, FOLATE, FERRITIN, TIBC, IRON, RETICCTPCT in the last 72 hours. Urine analysis:    Component Value Date/Time   COLORURINE YELLOW 12/06/2014 2207   APPEARANCEUR CLOUDY (A) 12/06/2014 2207   LABSPEC 1.025  12/06/2014 2207   PHURINE 5.5 12/06/2014 2207   GLUCOSEU NEGATIVE 12/06/2014 2207   HGBUR MODERATE (A) 12/06/2014 2207   BILIRUBINUR NEGATIVE 12/06/2014 2207   KETONESUR NEGATIVE 12/06/2014 2207   PROTEINUR NEGATIVE 12/06/2014 2207   UROBILINOGEN 1.0 07/30/2013 1527   NITRITE POSITIVE (A) 12/06/2014 2207   LEUKOCYTESUR SMALL (A) 12/06/2014 2207   Sepsis Labs: @LABRCNTIP (procalcitonin:4,lacticidven:4)  ) Recent Results (from the past 240 hour(s))  Blood Culture (routine x 2)     Status: None (Preliminary result)   Collection Time: 06/30/18 11:51 AM   Specimen: BLOOD LEFT HAND  Result Value Ref Range Status   Specimen Description   Final    BLOOD LEFT HAND Performed at [facility], [place] [address], [place], [place] [address]    Special Requests   Final    BOTTLES DRAWN AEROBIC AND ANAEROBIC  Blood Culture results may not be optimal due to an inadequate volume of blood received in culture bottles Performed at [facility] [address], [place], [place] [address]    Culture   Final    NO GROWTH < 24 HOURS Performed at [facility] [address], [place], [place] [address]    Report Status PENDING  Incomplete  Blood Culture (routine x 2)     Status: None (Preliminary result)   Collection Time: 06/30/18 11:51 AM   Specimen: BLOOD  Result Value Ref Range Status   Specimen Description   Final    BLOOD PORTA CATH Performed at [facility] [address] [place], [place] [address]    Special Requests   Final    BOTTLES DRAWN AEROBIC AND ANAEROBIC Blood Culture adequate volume Performed at [facility] [address] [place], [place] [address]    Culture   Final    NO GROWTH < 24 HOURS Performed at [facility] [address], [place], [place] [address]    Report Status PENDING  Incomplete  SARS Coronavirus 2 (CEPHEID- Performed in [place] hospital lab), Hosp Order     Status:  None   Collection Time: 06/30/18 11:51 AM   Specimen: Nasopharyngeal Swab  Result Value Ref Range Status   SARS Coronavirus 2 NEGATIVE NEGATIVE Final    Comment: (NOTE) If result is NEGATIVE SARS-CoV-2 target nucleic acids are NOT DETECTED. The SARS-CoV-2 RNA is generally detectable in upper and lower  respiratory specimens during the acute phase of infection. The lowest  concentration of SARS-CoV-2 viral copies this assay can detect is 250  copies / mL. A negative result does not preclude SARS-CoV-2 infection  and should not be used as the sole basis for treatment or other  patient management decisions.  A negative result may occur with  improper specimen collection / handling, submission of specimen other  than nasopharyngeal swab, presence of viral mutation(s) within the  areas targeted by this assay, and inadequate number of viral copies  (<250 copies / mL). A negative result must be combined with clinical  observations, patient history, and epidemiological information. If result is POSITIVE SARS-CoV-2 target nucleic acids are DETECTED. The SARS-CoV-2 RNA is generally detectable in upper and lower  respiratory specimens dur ing the acute phase of infection.  Positive  results are indicative of active infection with SARS-CoV-2.  Clinical  correlation with patient history and other diagnostic information is  necessary to determine patient infection status.  Positive results do  not rule out bacterial infection or co-infection with other viruses. If result is PRESUMPTIVE POSTIVE SARS-CoV-2 nucleic acids MAY BE PRESENT.   A presumptive positive result was obtained on the submitted specimen  and confirmed on repeat testing.  While 2019 novel coronavirus  (SARS-CoV-2) nucleic acids may be present in the submitted sample  additional confirmatory testing may be necessary for epidemiological  and / or clinical management purposes  to differentiate between  SARS-CoV-2 and other  Sarbecovirus currently known to infect humans.  If clinically indicated additional testing with an alternate test  methodology [phone]) is advised. The SARS-CoV-2 RNA is generally  detectable in upper and lower respiratory sp ecimens during the acute  phase of infection. The expected result is Negative. Fact Sheet for Patients:  StrictlyIdeas.no Fact Sheet for Healthcare Providers: BankingDealers.co.za This test is not yet approved or cleared by the [place] FDA and has been authorized for detection  and/or diagnosis of SARS-CoV-2 by FDA under an Emergency Use Authorization (EUA).  This EUA will remain in effect (meaning this test can be used) for the duration of the COVID-19 declaration under Section 564(b)(1) of the Act, 21 U.S.C. section 360bbb-3(b)(1), unless the authorization is terminated or revoked sooner. Performed at [facility], [place] [address] [place], [place] [address]       Studies: Ct Chest Wo Contrast  Result Date: 06/30/2018 CLINICAL DATA:  Increasing shortness of breath. History of metastatic lung cancer. EXAM: CT CHEST WITHOUT CONTRAST TECHNIQUE: Multidetector CT imaging of the chest was performed following the standard protocol without IV contrast. COMPARISON:  PET-CT dated June 15, 2018. CT chest dated Jun 01, 2018. FINDINGS: Cardiovascular: Normal heart size. No pericardial effusion. Unchanged ascending thoracic aortic aneurysm measuring up to 4.1 cm. Atherosclerotic calcification of the aortic arch and branch vessels. Unchanged right chest wall port catheter. Mediastinum/Nodes: Unchanged mediastinal lymphadenopathy. Index precarinal lymph node measures 2.1 cm, previously 2.1 cm. Limited evaluation known hilar lymphadenopathy due to lack of intravenous contrast. The thyroid gland, trachea, and esophagus demonstrate no significant findings. Lungs/Pleura: The large mass in the anterior left upper lobe  is grossly unchanged. Enlarging loculated left pleural effusion with increasing partial collapse of the left lower lobe. Left pleural drainage catheter again noted. Scattered small foci of air in the left pleural space are likely related to the drainage catheter. Previously seen ill-defined nodular opacities in the peripheral right upper and lower lobes are slightly more dense when compared to recent PET. Upper Abdomen: No acute abnormality. Musculoskeletal: No chest wall mass or suspicious bone lesions identified. Prior left mastectomy. Unchanged posterior midline sebaceous cyst. IMPRESSION: 1. Enlarging loculated left pleural effusion with increasing partial collapse of the left lower lobe. 2. Grossly unchanged large left upper lobe mass and nodal metastases. 3. Previously seen ill-defined nodular opacities in the peripheral right upper and lower lobes are slightly more dense when compared to recent PET-CT, and remain concerning for contralateral pulmonary metastases. 4. Unchanged ascending thoracic aortic aneurysm measuring 4.1 cm. Recommend annual imaging followup by CTA or MRA. This recommendation follows 2010 ACCF/AHA/AATS/ACR/ASA/SCA/SCAI/SIR/STS/SVM Guidelines for the Diagnosis and Management of Patients with Thoracic Aortic Disease. Circulation. 2010; 121: V784-O962. Aortic aneurysm NOS (ICD10-I71.9) 5.  Aortic atherosclerosis (ICD10-I70.0). Electronically Signed   By: Titus Dubin M.D.   On: 06/30/2018 19:49    Scheduled Meds: . albuterol  2.5 mg Nebulization Q6H  . alteplase (TPA) for intrapleural administration  6 mg Intrapleural Q12H  . amLODipine  5 mg Oral Daily  . aspirin EC  81 mg Oral Daily  . dexamethasone  1 mg Oral Daily  . enoxaparin (LOVENOX) injection  40 mg Subcutaneous Q24H  . gabapentin  300 mg Oral TID  . metoprolol tartrate  25 mg Oral BID  . sodium chloride flush  10-40 mL Intracatheter Q12H  . sodium chloride flush  3 mL Intravenous Q12H    Continuous Infusions: .  piperacillin-tazobactam (ZOSYN)  IV 3.375 g (07/01/18 0548)  . vancomycin 1,500 mg (06/30/18 2119)     LOS: 1 day     Alma Friendly, MD [facility]  If 7PM-7AM, please contact night-coverage [email or web address] 07/01/2018, 2:14 PM

## 2018-07-01 NOTE — Progress Notes (Signed)
Referring Physician(s): Lorin Glass  Supervising Physician: Daryll Brod  Patient Status:  [facility] - In-pt  Chief Complaint:  Malfunctioning pleurX  Subjective:  Patient lying in bed. No complaints.  Allergies: Fentanyl and Morphine and related  Medications: Prior to Admission medications   Medication Sig Start Date End Date Taking? Authorizing Provider  albuterol (PROVENTIL HFA;VENTOLIN HFA) 108 (90 Base) MCG/ACT inhaler Inhale 2 puffs into the lungs every 6 (six) hours as needed for wheezing or shortness of breath.    Yes [provider]  albuterol (PROVENTIL) (2.5 MG/3ML) 0.083% nebulizer solution Take 3 mLs (2.5 mg total) by nebulization every 6 (six) hours as needed for wheezing or shortness of breath. 05/15/18  Yes Regalado, Belkys A, MD  ALPRAZolam (XANAX) 0.5 MG tablet Take 1 tablet (0.5 mg total) by mouth 3 (three) times daily as needed for anxiety or sleep. 05/15/18  Yes Regalado, Belkys A, MD  amLODipine (NORVASC) 5 MG tablet Take 1 tablet (5 mg total) by mouth daily. 05/16/18  Yes Regalado, Belkys A, MD  aspirin EC 81 MG tablet Take 81 mg by mouth daily.   Yes [provider]  dexamethasone (DECADRON) 1 MG tablet Take 1 tablet (1 mg total) by mouth daily. 06/17/18  Yes Nicholas Lose, MD  gabapentin (NEURONTIN) 300 MG capsule Take 1 capsule (300 mg total) by mouth 3 (three) times daily. 09/23/17  Yes Nicholas Lose, MD  lidocaine-prilocaine (EMLA) cream Apply to affected area once Patient taking differently: Apply 1 application topically daily as needed (port access).  05/19/18  Yes Nicholas Lose, MD  metoprolol tartrate (LOPRESSOR) 25 MG tablet Take 1 tablet (25 mg total) by mouth 2 (two) times daily. 06/02/18  Yes Georgette Shell, MD  ondansetron (ZOFRAN) 8 MG tablet Take 1 tablet (8 mg total) by mouth every 8 (eight) hours as needed for nausea. 09/23/17  Yes Nicholas Lose, MD  oxyCODONE-acetaminophen (PERCOCET/ROXICET) 5-325 MG tablet Take 1  tablet by mouth every 8 (eight) hours as needed for moderate pain or severe pain (1 tab every 4-6 hour prn pain). Patient taking differently: Take 1 tablet by mouth every 6 (six) hours as needed for moderate pain or severe pain.  06/17/18  Yes Nicholas Lose, MD  PARoxetine (PAXIL) 20 MG tablet Take 1 tablet (20 mg total) by mouth 2 (two) times daily. 05/15/18  Yes Regalado, Belkys A, MD  PREMARIN vaginal cream INSERT 1 APPLICATORFUL VAGINALLY DAILY Patient taking differently: Place 1 Applicatorful vaginally daily as needed (dryness).  04/19/17  Yes Nicholas Lose, MD  prochlorperazine (COMPAZINE) 10 MG tablet Take 1 tablet (10 mg total) by mouth every 6 (six) hours as needed (Nausea or vomiting). Patient taking differently: Take 10 mg by mouth every 6 (six) hours as needed for nausea or vomiting.  05/19/18  Yes Nicholas Lose, MD  zolpidem (AMBIEN CR) 6.25 MG CR tablet Take 1 tablet (6.25 mg total) by mouth at bedtime as needed for sleep. 06/24/18  Yes Nicholas Lose, MD     Vital Signs: BP (!) 133/94 (BP Location: Right Arm)   Pulse (!) 102   Temp 97.7 F (36.5 C) (Oral)   Resp 18   Wt 79 kg   SpO2 95%   BMI 26.88 kg/m   Physical Exam PleurX in good position. Clear fluid in tubing. TPA administered into PleurX using sterile technique at 11:30 Will allow to sit x 2 hours then attempt to drain.  Imaging: Ct Chest Wo Contrast  Result Date: 06/30/2018 CLINICAL DATA:  Increasing shortness of breath. History of metastatic lung cancer. EXAM: CT CHEST WITHOUT CONTRAST TECHNIQUE: Multidetector CT imaging of the chest was performed following the standard protocol without IV contrast. COMPARISON:  PET-CT dated June 15, 2018. CT chest dated Jun 01, 2018. FINDINGS: Cardiovascular: Normal heart size. No pericardial effusion. Unchanged ascending thoracic aortic aneurysm measuring up to 4.1 cm. Atherosclerotic calcification of the aortic arch and branch vessels. Unchanged right chest wall port catheter.  Mediastinum/Nodes: Unchanged mediastinal lymphadenopathy. Index precarinal lymph node measures 2.1 cm, previously 2.1 cm. Limited evaluation known hilar lymphadenopathy due to lack of intravenous contrast. The thyroid gland, trachea, and esophagus demonstrate no significant findings. Lungs/Pleura: The large mass in the anterior left upper lobe is grossly unchanged. Enlarging loculated left pleural effusion with increasing partial collapse of the left lower lobe. Left pleural drainage catheter again noted. Scattered small foci of air in the left pleural space are likely related to the drainage catheter. Previously seen ill-defined nodular opacities in the peripheral right upper and lower lobes are slightly more dense when compared to recent PET. Upper Abdomen: No acute abnormality. Musculoskeletal: No chest wall mass or suspicious bone lesions identified. Prior left mastectomy. Unchanged posterior midline sebaceous cyst. IMPRESSION: 1. Enlarging loculated left pleural effusion with increasing partial collapse of the left lower lobe. 2. Grossly unchanged large left upper lobe mass and nodal metastases. 3. Previously seen ill-defined nodular opacities in the peripheral right upper and lower lobes are slightly more dense when compared to recent PET-CT, and remain concerning for contralateral pulmonary metastases. 4. Unchanged ascending thoracic aortic aneurysm measuring 4.1 cm. Recommend annual imaging followup by CTA or MRA. This recommendation follows 2010 ACCF/AHA/AATS/ACR/ASA/SCA/SCAI/SIR/STS/SVM Guidelines for the Diagnosis and Management of Patients with Thoracic Aortic Disease. Circulation. 2010; 121: E831-D176. Aortic aneurysm NOS (ICD10-I71.9) 5.  Aortic atherosclerosis (ICD10-I70.0). Electronically Signed   By: Titus Dubin M.D.   On: 06/30/2018 19:49   Dg Chest Port 1 View  Result Date: 06/30/2018 CLINICAL DATA:  Increased shortness of breath with decreased drainage from the PleurX catheter. History of  metastatic lung cancer. EXAM: PORTABLE CHEST 1 VIEW COMPARISON:  PET-CT dated June 15, 2018. Chest x-ray dated Jun 03, 2018. FINDINGS: Unchanged right chest wall port catheter. Unchanged left PleurX catheter. Increasing size of the now large loculated left pleural effusion with likely complete collapse of the left lower lobe and partial collapse of the left upper lobe. The right lung is clear. No pneumothorax. Stable cardiomediastinal silhouette. No acute osseous abnormality. IMPRESSION: 1. Increasing now large loculated left pleural effusion with worsening aeration in the left lung. Electronically Signed   By: Titus Dubin M.D.   On: 06/30/2018 12:44    Labs:  CBC: Recent Labs    06/17/18 1144 06/24/18 1107 06/30/18 1151 06/30/18 1355 06/30/18 1437 07/01/18 0617  WBC 2.0* 4.9 4.9  --   --  1.8*  HGB 13.5 13.1 5.4* 10.6* 10.7* 10.1*  HCT 42.9 41.7 17.4* 33.9* 34.3* 32.4*  PLT 342 372 379  --   --  250    COAGS: Recent Labs    05/25/18 1023 06/01/18 1714  INR 1.0 1.0    BMP: Recent Labs    06/17/18 1144 06/24/18 1107 06/30/18 1151 07/01/18 0617  NA 142 141 140 138  K 3.2* 3.7 3.3* 3.6  CL 103 102 101 102  CO2 27 28 27 27   GLUCOSE 108* 98 115* 90  BUN 5* 8 10 11   CALCIUM 8.9 8.9 8.6* 8.5*  CREATININE 0.75 0.75 0.85  0.67  GFRNONAA >60 >60 >60 >60  GFRAA >60 >60 >60 >60    LIVER FUNCTION TESTS: Recent Labs    06/17/18 1144 06/24/18 1107 06/30/18 1151 07/01/18 0617  BILITOT 0.4 0.3 0.6 0.5  AST 12* 16 11* 13*  ALT 7 10 8 9   ALKPHOS 59 63 52 45  PROT 6.4* 6.6 6.4* 5.8*  ALBUMIN 3.3* 3.1* 3.0* 2.5*    Assessment and Plan:  TPA administered into PleurX using sterile technique at 11:30  Will allow to sit x 2 hours then attempt to drain.   Electronically Signed: Murrell Redden, PA-C 07/01/2018, 11:53 AM    I spent a total of 15 Minutes at the the patient's bedside AND on the patient's hospital floor or unit, greater than 50% of which was  counseling/coordinating care for clogged PleurX

## 2018-07-01 NOTE — Progress Notes (Signed)
  Instillation of TPA into PleurX at 11:30 am.  Successful aspiration of 1.6 L of clear red tinged fluid at 1:30 pm.  Ok to use PleurX at home as previously instructed.  WENDY S BLAIR PA-C 07/01/2018 2:36 PM

## 2018-07-02 LAB — CBC WITH DIFFERENTIAL/PLATELET
Abs Immature Granulocytes: 0.11 10*3/uL — ABNORMAL HIGH (ref 0.00–0.07)
Basophils Absolute: 0 10*3/uL (ref 0.0–0.1)
Basophils Relative: 2 %
Eosinophils Absolute: 0 10*3/uL (ref 0.0–0.5)
Eosinophils Relative: 0 %
HCT: 34.1 % — ABNORMAL LOW (ref 36.0–46.0)
Hemoglobin: 10.5 g/dL — ABNORMAL LOW (ref 12.0–15.0)
Immature Granulocytes: 6 %
Lymphocytes Relative: 26 %
Lymphs Abs: 0.5 10*3/uL — ABNORMAL LOW (ref 0.7–4.0)
MCH: 29.2 pg (ref 26.0–34.0)
MCHC: 30.8 g/dL (ref 30.0–36.0)
MCV: 94.7 fL (ref 80.0–100.0)
Monocytes Absolute: 0.6 10*3/uL (ref 0.1–1.0)
Monocytes Relative: 32 %
Neutro Abs: 0.7 10*3/uL — ABNORMAL LOW (ref 1.7–7.7)
Neutrophils Relative %: 34 %
Platelets: 295 10*3/uL (ref 150–400)
RBC: 3.6 MIL/uL — ABNORMAL LOW (ref 3.87–5.11)
RDW: 13.2 % (ref 11.5–15.5)
WBC: 1.9 10*3/uL — ABNORMAL LOW (ref 4.0–10.5)
nRBC: 0 % (ref 0.0–0.2)

## 2018-07-02 LAB — BASIC METABOLIC PANEL
Anion gap: 17 — ABNORMAL HIGH (ref 5–15)
BUN: 11 mg/dL (ref 8–23)
CO2: 27 mmol/L (ref 22–32)
Calcium: 8.8 mg/dL — ABNORMAL LOW (ref 8.9–10.3)
Chloride: 96 mmol/L — ABNORMAL LOW (ref 98–111)
Creatinine, Ser: 0.78 mg/dL (ref 0.44–1.00)
GFR calc Af Amer: >60 mL/min (ref >60)
GFR calc non Af Amer: >60 mL/min (ref >60)
Glucose, Bld: 111 mg/dL — ABNORMAL HIGH (ref 70–99)
Potassium: 3.8 mmol/L (ref 3.5–5.1)
Sodium: 140 mmol/L (ref 135–145)

## 2018-07-02 MED ORDER — ZOLPIDEM TARTRATE 5 MG PO TABS: 5.0000 mg | ORAL_TABLET | Freq: Every evening | ORAL | Status: DC | PRN

## 2018-07-02 MED ORDER — HEPARIN SOD (PORK) LOCK FLUSH 100 UNIT/ML IV SOLN
500.0000 [IU] | INTRAVENOUS | Status: AC | PRN
  Administered 2018-07-02: 500 [IU]

## 2018-07-02 MED ORDER — ALBUTEROL SULFATE (2.5 MG/3ML) 0.083% IN NEBU: 2.5000 mg | INHALATION_SOLUTION | Freq: Four times a day (QID) | RESPIRATORY_TRACT | 0 refills | Status: AC | PRN

## 2018-07-02 NOTE — Plan of Care (Signed)
Preparing for discharge home and discharge teaching done at bedside with questions answered.

## 2018-07-02 NOTE — Discharge Summary (Signed)
Discharge Summary  KINDRED HEYING ZOX:[id number] DOB: 1955/12/21  PCP: [name], MD  Admit date: 06/30/2018 Discharge date: 07/02/2018  Time spent: 40 mins   Recommendations for Outpatient Follow-up:  1. Follow-up with PCP with repeat labs in 1 week 2. Follow-up with oncologist Dr. Lindi Adie as scheduled  Discharge Diagnoses:  Active Hospital Problems   Diagnosis Date Noted   Loculated pleural effusion 06/30/2018   Hypokalemia 06/30/2018   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   Hypertension 05/12/2018   Anxiety and depression 05/12/2018   Antineoplastic chemotherapy induced pancytopenia ([place]) 04/26/2015   Breast cancer of upper-outer quadrant of left female breast ([place]) 01/01/2015    Resolved Hospital Problems  No resolved problems to display.    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 According to patient, she has been having nonproductive cough as well as shortness of breath which gets worse since about 2 days. She also has been having chronic left anterior chest pain which is constant and achy with no aggravating or relieving factor. She normally does not use any oxygen at home however since past 2 days, she has been using 2 L of oxygen with minimal relief. She denies any fever, nausea, vomiting, any problem with urination or with bowel movement. No sick contact or any recent travel. According to patient, she drains Pleurx catheter about every third day and usually  gets 150 mL output since last few days, she has been getting only about 50 mL out. In the ED, she was tachycardic, tachypneic and hypoxic. CBC revealed leukocytosis. Lactic acid within normal limits. Chest x-ray shows loculated left-sided pleural effusion worsened than before. She was given vancomycin, Zosyn and Flagyl in the emergency department and hospital service was consulted. IR has been consulted per my request.    Today, patient denies any new complaints, reports feeling much better after 1.6 L of clear red-tinged fluid was drained via Pleurx on 07/01/2018.  Patient reports shortness of breath is back to baseline.  Patient advised to seek medical help if patient develops fever or significant worsening shortness of breath with cough.  Patient advised to make an appointment in 1 week to see her primary care and follow-up with her oncologist Dr. Lindi Adie as scheduled.  Hospital Course:  Active Problems:   Breast cancer of upper-outer quadrant of left female breast ([place])   Antineoplastic chemotherapy induced pancytopenia ([facility])   Malignant pleural effusion   Hypertension   Anxiety and depression   Loculated pleural effusion   Hypokalemia  Recurrent malignant pleural effusion s/p PleurX catheter  Currently afebrile, with leukopenia BC x2 NGTD Chest x-ray showed worsening loculated left sided pleural effusion Chest CT showed enlarging loculated left pleural effusion with increasing partial collapse of the left lower lobe Pleurx catheter currently functioning, IR was able to successfully aspirate 1.6 L of clear red-tinged fluid on 07/01/2018 S/P IV Zosyn, and vancomycin (discussed in detail with Dr. Lindi Adie, advised to DC antibiotics as pleural effusion is likely malignant) IR consulted- advised patient is okay to use  Pleurx at home as previously instructed Pain management Follow-up with oncologist, PCP  Leukopenia/normocytic anemia Likely secondary to chemotherapy Follow-up with PCP,  oncologist for repeat labs  Hypertension Continue home medications.  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 Lindi Adie on 07/01/2018, recommended discontinuation of IV antibiotics and will closely follow-up patient in about a week with repeat labs         Malnutrition Type:      Malnutrition Characteristics:      Nutrition Interventions:      Estimated body mass index is 26.84 kg/m as calculated from the following:   Height as of 06/24/18: 5' 7.5" (1.715 m).   Weight as of this encounter: 78.9 kg.    Procedures:  Pleural effusion drainage via Pleurx by IR  Consultations:  IR  Oncology  Discharge Exam: BP 114/83    Pulse (!) 106    Temp 98.7 F (37.1 C) (Oral)    Resp 20    Wt 78.9 kg    SpO2 100%    BMI 26.84 kg/m   General: NAD Cardiovascular: S1, S2 present Respiratory: Diminished breath sounds in left lung  Discharge Instructions You were cared for by a hospitalist during your hospital stay. If you have any questions about your discharge medications or the care you received while you were in the hospital after you are discharged, you can call the unit and asked to speak with the hospitalist on call if the hospitalist that took care of you is not available. Once you are discharged, your primary care physician will handle any further medical issues. Please note that NO REFILLS for any discharge medications will be authorized once you are discharged, as it is imperative that you return to your primary care physician (or establish a relationship with a primary care physician if you do not have one) for your aftercare needs so that they can reassess your need for medications and monitor your lab values.   Allergies as of 07/02/2018      Reactions   Fentanyl Nausea And Vomiting, Other (See Comments)   Headache   Morphine And Related Nausea And Vomiting, Other (See Comments)    Headache      Medication List    TAKE these medications   albuterol 108 (90 Base) MCG/ACT inhaler Commonly known as: VENTOLIN HFA Inhale 2 puffs into the lungs every 6 (six) hours as needed for wheezing or shortness of breath.   albuterol (2.5 MG/3ML) 0.083% nebulizer solution Commonly known as: PROVENTIL Take 3 mLs (2.5 mg total) by nebulization every 6 (six) hours as needed for up to 30 days for wheezing or shortness of breath.   ALPRAZolam 0.5 MG tablet Commonly known as: XANAX Take 1 tablet (0.5 mg total) by mouth 3 (three) times daily as needed for anxiety or sleep.   amLODipine 5 MG tablet Commonly known as: NORVASC Take 1 tablet (5 mg total) by mouth daily.   aspirin EC 81 MG tablet Take 81 mg by mouth daily.   dexamethasone 1 MG tablet Commonly known as: DECADRON Take 1 tablet (1 mg total) by mouth daily.   gabapentin 300 MG capsule Commonly known as: NEURONTIN Take 1 capsule (300 mg total) by mouth 3 (three) times daily.   lidocaine-prilocaine cream Commonly known as: EMLA Apply to affected area once What changed:   how much to take  how to take this  when to take this  reasons to take this  additional instructions   metoprolol tartrate 25 MG tablet Commonly known as: LOPRESSOR Take 1 tablet (25 mg total) by mouth 2 (two) times daily.   ondansetron 8 MG tablet Commonly known as: ZOFRAN Take 1 tablet (8 mg total) by mouth every 8 (eight) hours as needed for nausea.   oxyCODONE-acetaminophen 5-325 MG tablet Commonly known as: PERCOCET/ROXICET Take 1 tablet by mouth every 8 (eight) hours as needed for moderate pain or severe pain (1 tab every 4-6 hour prn pain). What changed:   when to take this  reasons to take this   PARoxetine 20 MG tablet Commonly known as: PAXIL Take 1 tablet (20 mg total) by mouth 2 (two) times daily.   Premarin vaginal cream Generic drug: conjugated estrogens INSERT 1 APPLICATORFUL VAGINALLY DAILY What changed: See  the new instructions.   prochlorperazine 10 MG tablet Commonly known as: COMPAZINE Take 1 tablet (10 mg total) by mouth every 6 (six) hours as needed (Nausea or vomiting). What changed: reasons to take this   zolpidem 6.25 MG CR tablet Commonly known as: Ambien CR Take 1 tablet (6.25 mg total) by mouth at bedtime as needed for sleep.      Allergies  Allergen Reactions   Fentanyl Nausea And Vomiting and Other (See Comments)    Headache   Morphine And Related Nausea And Vomiting and Other (See Comments)    Headache   Follow-up Information    [name], MD. Schedule an appointment as soon as possible for a visit in 1 week(s).   Specialty: Cardiology Contact information: [place] [address] [phone]            The results of significant diagnostics from this hospitalization (including imaging, microbiology, ancillary and laboratory) are listed below for reference.    Significant Diagnostic Studies: Ct Chest Wo Contrast  Result Date: 06/30/2018 CLINICAL DATA:  Increasing shortness of breath. History of metastatic lung cancer. EXAM: CT CHEST WITHOUT CONTRAST TECHNIQUE: Multidetector CT imaging of the chest was performed following the standard protocol without IV contrast. COMPARISON:  PET-CT dated June 15, 2018. CT chest dated Jun 01, 2018. FINDINGS: Cardiovascular: Normal heart size. No pericardial effusion. Unchanged ascending thoracic aortic aneurysm measuring up to 4.1 cm. Atherosclerotic calcification of the aortic arch and branch vessels. Unchanged right chest wall port catheter. Mediastinum/Nodes: Unchanged mediastinal lymphadenopathy. Index precarinal lymph node measures 2.1 cm, previously 2.1 cm. Limited evaluation known hilar lymphadenopathy due to lack of intravenous contrast. The thyroid gland, trachea, and esophagus demonstrate no significant findings. Lungs/Pleura: The large mass in the anterior left upper lobe is grossly unchanged.  Enlarging loculated left pleural effusion with increasing partial collapse of the left lower lobe. Left pleural drainage catheter again noted. Scattered small foci of air in the left pleural space are likely related to the drainage catheter. Previously seen ill-defined nodular opacities in the peripheral right upper and lower lobes are slightly more dense when compared to recent PET. Upper Abdomen: No acute abnormality. Musculoskeletal: No chest wall mass or suspicious bone lesions identified. Prior left mastectomy. Unchanged posterior midline sebaceous cyst. IMPRESSION: 1. Enlarging loculated left pleural effusion with increasing partial collapse of the left lower lobe. 2. Grossly unchanged large left upper lobe mass and nodal metastases. 3. Previously seen ill-defined nodular opacities in the peripheral right upper and lower lobes are slightly more dense when compared to recent PET-CT, and remain concerning for contralateral pulmonary metastases. 4. Unchanged ascending thoracic aortic aneurysm measuring 4.1 cm. Recommend annual imaging followup  by CTA or MRA. This recommendation follows 2010 ACCF/AHA/AATS/ACR/ASA/SCA/SCAI/SIR/STS/SVM Guidelines for the Diagnosis and Management of Patients with Thoracic Aortic Disease. Circulation. 2010; 121: O841-Y606. Aortic aneurysm NOS (ICD10-I71.9) 5.  Aortic atherosclerosis (ICD10-I70.0). Electronically Signed   By: Titus Dubin M.D.   On: 06/30/2018 19:49   Nm Pet Image Initial (pi) Skull Base To Thigh  Result Date: 06/15/2018 CLINICAL DATA:  Initial treatment strategy for breast cancer. EXAM: NUCLEAR MEDICINE PET SKULL BASE TO THIGH TECHNIQUE: 9.1 mCi F-18 FDG was injected intravenously. Full-ring PET imaging was performed from the skull base to thigh after the radiotracer. CT data was obtained and used for attenuation correction and anatomic localization. Fasting blood glucose: 116 mg/dl COMPARISON:  CT chest 06/01/2018. FINDINGS: Mediastinal blood pool activity:  SUV max 2.4 Liver activity: SUV max NA NECK: No hypermetabolic lymph nodes in the neck. Incidental CT findings: none CHEST: Hypermetabolic lymph node in the right thoracic inlet demonstrates SUV max = 2.0. Hypermetabolic lymphadenopathy is seen in the mediastinum and both hilar regions, left greater than right. Index precarinal node measured on previous CT at 2.1 cm short axis is 2.1 cm again today with SUV max = 16.9. Subcarinal hypermetabolic lymphadenopathy demonstrates SUV max = 13.6. The large mass anterior left upper lobe is markedly hypermetabolic with SUV max = 30.1. Areas of ill-defined nodular opacity peripherally in the right lung (both regions visible on image 37/series 8) show hypermetabolism. 8 mm nodular lesion along the minor fissure measured on the previous diagnostic CT demonstrates SUV max = 2.4 today. The 1.3 cm subpleural nodular lesion in the right lower lobe measured on the previous study is hypermetabolic with SUV max = 3.4. Hypermetabolic focus noted deep posterior left costophrenic sulcus, likely pleural involvement by tumor. Loculated left pleural effusion shows low level FDG uptake. Incidental CT findings: Left pleural drain visualized. Right Port-A-Cath tip is positioned at the lower SVC level. Ascending thoracic aorta is again noted to measure 4.1 cm diameter. Left mastectomy. Probable sebaceous cyst midline subcutaneous fat of the mid thorax posteriorly. ABDOMEN/PELVIS: No abnormal hypermetabolic activity within the liver, pancreas, adrenal glands, or spleen. No hypermetabolic lymph nodes in the abdomen or pelvis. Scattered areas of uptake in the colon are presumably physiologic. FDG activity on the perineum likely related to urinary contaminant. Incidental CT findings: Layering sludge noted in the gallbladder. Low-density lesions in each kidney approach water attenuation and are likely cysts. Fusiform aneurysmal dilatation of the infrarenal abdominal aorta noted measuring up to 3.8 cm  maximum diameter. There is left colonic diverticulosis without diverticulitis. 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 This lesion is not hypermetabolic on PET imaging. SKELETON: No focal hypermetabolic activity to suggest skeletal metastasis. Incidental CT findings: none IMPRESSION: 1. Hypermetabolic nodal metastases in the right thoracic inlet, mediastinum, and both hilar regions. 2. The dominant left upper lobe mass lesion is hypermetabolic. 3. Hypermetabolic pleural nodule deep posterior left costophrenic sulcus with low level uptake in the loculated left pleural effusion. Findings are consistent with tumor involvement of the pleura. 4. Ill-defined nodular opacities in the peripheral right lung identified on the recent diagnostic chest CT show hypermetabolic FDG uptake, concerning for metastatic involvement. 5. Aneurysmal dilatation of the ascending thoracic aorta, as reported previously. Attention on follow-up imaging recommended. 6. Fusiform aneurysmal dilatation of the infrarenal abdominal aorta. Recommend followup by ultrasound in 2 years. This recommendation follows ACR consensus guidelines: White Paper of the ACR Incidental Findings Committee II on Vascular Findings. J Am  Coll Radiol 2013; 10:789-794. Electronically Signed   By: Misty Stanley M.D.   On: 06/15/2018 14:09   Dg Chest Port 1 View  Result Date: 06/30/2018 CLINICAL DATA:  Increased shortness of breath with decreased drainage from the PleurX catheter. History of metastatic lung cancer. EXAM: PORTABLE CHEST 1 VIEW COMPARISON:  PET-CT dated June 15, 2018. Chest x-ray dated Jun 03, 2018. FINDINGS: Unchanged right chest wall port catheter. Unchanged left PleurX catheter. Increasing size of the now large loculated left pleural effusion with likely complete collapse of the left lower lobe and partial collapse of the left upper lobe. The right lung is clear. No pneumothorax. Stable  cardiomediastinal silhouette. No acute osseous abnormality. IMPRESSION: 1. Increasing now large loculated left pleural effusion with worsening aeration in the left lung. Electronically Signed   By: Titus Dubin M.D.   On: 06/30/2018 12:44   Dg Chest Port 1 View  Result Date: 06/03/2018 CLINICAL DATA:  Chest tube placement. EXAM: PORTABLE CHEST 1 VIEW COMPARISON:  CT 06/01/2018.  Chest x-ray 06/01/2018. FINDINGS: PowerPort catheter with tip over superior vena cava in stable position. Left chest tube noted with tip over the upper medial left chest. Interim partial resolution of left-sided pleural effusion. Underlying left lung atelectasis/infiltrate. No pneumothorax. Heart size stable. Surgical clips left axilla. No acute bony abnormality identified. IMPRESSION: Interim placement of left chest tube with partial resolution of left-sided pleural effusion. Underlying left lung atelectasis/infiltrate noted. No pneumothorax. Electronically Signed   By: Marcello Moores  Register   On: 06/03/2018 06:37    Microbiology: Recent Results (from the past 240 hour(s))  Blood Culture (routine x 2)     Status: None (Preliminary result)   Collection Time: 06/30/18 11:51 AM   Specimen: BLOOD LEFT HAND  Result Value Ref Range Status   Specimen Description   Final    BLOOD LEFT HAND Performed at [facility], [place] [address], [place], [place] [address]    Special Requests   Final    BOTTLES DRAWN AEROBIC AND ANAEROBIC Blood Culture results may not be optimal due to an inadequate volume of blood received in culture bottles Performed at [facility] [address] [place], [place] [address]    Culture   Final    NO GROWTH 2 DAYS Performed at [facility] [address] [place], [place] [address]    Report Status PENDING  Incomplete  Blood Culture (routine x 2)     Status: None (Preliminary result)   Collection Time: 06/30/18 11:51 AM   Specimen: BLOOD  Result Value Ref  Range Status   Specimen Description   Final    BLOOD PORTA CATH Performed at [facility] [address] [place], [place] [address]    Special Requests   Final    BOTTLES DRAWN AEROBIC AND ANAEROBIC Blood Culture adequate volume Performed at [facility] [address] [place], [place] [address]    Culture   Final    NO GROWTH 2 DAYS Performed at [facility] [address], [place], [place] [address]    Report Status PENDING  Incomplete  SARS Coronavirus 2 (CEPHEID- Performed in [facility] hospital lab), Hosp Order     Status: None   Collection Time: 06/30/18 11:51 AM   Specimen: Nasopharyngeal Swab  Result Value Ref Range Status   SARS Coronavirus 2 NEGATIVE NEGATIVE Final    Comment: (NOTE) If result is NEGATIVE SARS-CoV-2 target nucleic acids are NOT  DETECTED. The SARS-CoV-2 RNA is generally detectable in upper and lower  respiratory specimens during the acute phase of infection. The lowest  concentration of SARS-CoV-2 viral copies this assay can detect is 250  copies / mL. A negative result does not preclude SARS-CoV-2 infection  and should not be used as the sole basis for treatment or other  patient management decisions.  A negative result may occur with  improper specimen collection / handling, submission of specimen other  than nasopharyngeal swab, presence of viral mutation(s) within the  areas targeted by this assay, and inadequate number of viral copies  (<250 copies / mL). A negative result must be combined with clinical  observations, patient history, and epidemiological information. If result is POSITIVE SARS-CoV-2 target nucleic acids are DETECTED. The SARS-CoV-2 RNA is generally detectable in upper and lower  respiratory specimens dur ing the acute phase of infection.  Positive  results are indicative of active infection with SARS-CoV-2.  Clinical  correlation with patient history and other diagnostic  information is  necessary to determine patient infection status.  Positive results do  not rule out bacterial infection or co-infection with other viruses. If result is PRESUMPTIVE POSTIVE SARS-CoV-2 nucleic acids MAY BE PRESENT.   A presumptive positive result was obtained on the submitted specimen  and confirmed on repeat testing.  While 2019 novel coronavirus  (SARS-CoV-2) nucleic acids may be present in the submitted sample  additional confirmatory testing may be necessary for epidemiological  and / or clinical management purposes  to differentiate between  SARS-CoV-2 and other Sarbecovirus currently known to infect humans.  If clinically indicated additional testing with an alternate test  methodology [id number]) is advised. The SARS-CoV-2 RNA is generally  detectable in upper and lower respiratory sp ecimens during the acute  phase of infection. The expected result is Negative. Fact Sheet for Patients:  StrictlyIdeas.no Fact Sheet for Healthcare Providers: BankingDealers.co.za This test is not yet approved or cleared by the [place] FDA and has been authorized for detection and/or diagnosis of SARS-CoV-2 by FDA under an Emergency Use Authorization (EUA).  This EUA will remain in effect (meaning this test can be used) for the duration of the COVID-19 declaration under Section 564(b)(1) of the Act, 21 U.S.C. section 360bbb-3(b)(1), unless the authorization is terminated or revoked sooner. Performed at [facility], [place] [address], [place], [place] [address]      Labs: Basic Metabolic Panel: Recent Labs  Lab 06/30/18 1151 06/30/18 1437 07/01/18 0617 07/02/18 0508  NA 140  --  138 140  K 3.3*  --  3.6 3.8  CL 101  --  102 96*  CO2 27  --  27 27  GLUCOSE 115*  --  90 111*  BUN 10  --  11 11  CREATININE 0.85  --  0.67 0.78  CALCIUM 8.6*  --  8.5* 8.8*  MG  --  1.9  --   --    Liver Function  Tests: Recent Labs  Lab 06/30/18 1151 07/01/18 0617  AST 11* 13*  ALT 8 9  ALKPHOS 52 45  BILITOT 0.6 0.5  PROT 6.4* 5.8*  ALBUMIN 3.0* 2.5*   No results for input(s): LIPASE, AMYLASE in the last 168 hours. No results for input(s): AMMONIA in the last 168 hours. CBC: Recent Labs  Lab 06/30/18 1151 06/30/18 1355 06/30/18 1437 07/01/18 0617 07/02/18 0508  WBC 4.9  --   --  1.8* 1.9*  NEUTROABS 3.1  --   --   --  0.7*  HGB 5.4* 10.6* 10.7* 10.1* 10.5*  HCT 17.4* 33.9* 34.3* 32.4* 34.1*  MCV 95.6  --   --  94.2 94.7  PLT 379  --   --  250 295   Cardiac Enzymes: No results for input(s): CKTOTAL, CKMB, CKMBINDEX, TROPONINI in the last 168 hours. BNP: BNP (last 3 results) No results for input(s): BNP in the last 8760 hours.  ProBNP (last 3 results) No results for input(s): PROBNP in the last 8760 hours.  CBG: No results for input(s): GLUCAP in the last 168 hours.     Signed:  Alma Friendly, MD [facility] 07/02/2018, 2:23 PM

## 2018-07-04 ENCOUNTER — Telehealth: Payer: Self-pay

## 2018-07-04 NOTE — Telephone Encounter (Signed)
RN returned call to spouse, Jeneen Rinks.  Jeneen Rinks reports that patient is down to one Pleurx drain bulb, and requesting refills.    RN placed call to [facility] at [phone] to initiate refills.  [facility] requesting information from last three drainage sessions.  RN notified husband of request, patient and husband are doing drains and dressing changes on their own.  Pt's husband voiced understanding and will contact [facility] to provide information for refill.

## 2018-07-05 LAB — CULTURE, BLOOD (ROUTINE X 2)
Culture: NO GROWTH
Culture: NO GROWTH
Special Requests: ADEQUATE

## 2018-07-07 ENCOUNTER — Encounter: Payer: Self-pay | Admitting: Adult Health

## 2018-07-07 ENCOUNTER — Inpatient Hospital Stay: Payer: Medicare Other

## 2018-07-07 ENCOUNTER — Other Ambulatory Visit: Payer: Self-pay | Admitting: Student

## 2018-07-07 ENCOUNTER — Inpatient Hospital Stay (HOSPITAL_BASED_OUTPATIENT_CLINIC_OR_DEPARTMENT_OTHER): Payer: Medicare Other | Admitting: Adult Health

## 2018-07-07 ENCOUNTER — Other Ambulatory Visit: Payer: Self-pay

## 2018-07-07 ENCOUNTER — Inpatient Hospital Stay: Payer: Medicare Other | Attending: Hematology and Oncology

## 2018-07-07 ENCOUNTER — Other Ambulatory Visit: Payer: Self-pay | Admitting: Adult Health

## 2018-07-07 ENCOUNTER — Ambulatory Visit (HOSPITAL_COMMUNITY)
Admission: RE | Admit: 2018-07-07 | Discharge: 2018-07-07 | Disposition: A | Discharge status: Home / Self Care | Payer: Medicare Other | Source: Ambulatory Visit | Attending: Adult Health | Admitting: Adult Health

## 2018-07-07 VITALS — BP 101/73 | HR 90 | Temp 97.9°F | Resp 18 | Ht 67.5 in | Wt 166.6 lb

## 2018-07-07 DIAGNOSIS — Z7189 Other specified counseling: Secondary | ICD-10-CM

## 2018-07-07 DIAGNOSIS — Z9012 Acquired absence of left breast and nipple: Secondary | ICD-10-CM

## 2018-07-07 DIAGNOSIS — F329 Major depressive disorder, single episode, unspecified: Secondary | ICD-10-CM

## 2018-07-07 DIAGNOSIS — C773 Secondary and unspecified malignant neoplasm of axilla and upper limb lymph nodes: Secondary | ICD-10-CM | POA: Diagnosis not present

## 2018-07-07 DIAGNOSIS — C50412 Malignant neoplasm of upper-outer quadrant of left female breast: Secondary | ICD-10-CM

## 2018-07-07 DIAGNOSIS — Z923 Personal history of irradiation: Secondary | ICD-10-CM | POA: Diagnosis not present

## 2018-07-07 DIAGNOSIS — J440 Chronic obstructive pulmonary disease with acute lower respiratory infection: Secondary | ICD-10-CM | POA: Insufficient documentation

## 2018-07-07 DIAGNOSIS — J910 Malignant pleural effusion: Secondary | ICD-10-CM

## 2018-07-07 DIAGNOSIS — I10 Essential (primary) hypertension: Secondary | ICD-10-CM | POA: Diagnosis not present

## 2018-07-07 DIAGNOSIS — J90 Pleural effusion, not elsewhere classified: Secondary | ICD-10-CM | POA: Diagnosis not present

## 2018-07-07 DIAGNOSIS — Z171 Estrogen receptor negative status [ER-]: Secondary | ICD-10-CM | POA: Diagnosis not present

## 2018-07-07 DIAGNOSIS — F17210 Nicotine dependence, cigarettes, uncomplicated: Secondary | ICD-10-CM | POA: Diagnosis not present

## 2018-07-07 DIAGNOSIS — E876 Hypokalemia: Secondary | ICD-10-CM | POA: Insufficient documentation

## 2018-07-07 DIAGNOSIS — Z5111 Encounter for antineoplastic chemotherapy: Secondary | ICD-10-CM | POA: Insufficient documentation

## 2018-07-07 DIAGNOSIS — Z95828 Presence of other vascular implants and grafts: Secondary | ICD-10-CM

## 2018-07-07 HISTORY — PX: IR THORACENTESIS ASP PLEURAL SPACE W/IMG GUIDE: IMG5380

## 2018-07-07 LAB — CBC WITH DIFFERENTIAL (CANCER CENTER ONLY)
Abs Immature Granulocytes: 0.15 10*3/uL — ABNORMAL HIGH (ref 0.00–0.07)
Basophils Absolute: 0 10*3/uL (ref 0.0–0.1)
Basophils Relative: 0 %
Eosinophils Absolute: 0 10*3/uL (ref 0.0–0.5)
Eosinophils Relative: 0 %
HCT: 34.4 % — ABNORMAL LOW (ref 36.0–46.0)
Hemoglobin: 10.6 g/dL — ABNORMAL LOW (ref 12.0–15.0)
Immature Granulocytes: 2 %
Lymphocytes Relative: 10 %
Lymphs Abs: 0.7 10*3/uL (ref 0.7–4.0)
MCH: 28.2 pg (ref 26.0–34.0)
MCHC: 30.8 g/dL (ref 30.0–36.0)
MCV: 91.5 fL (ref 80.0–100.0)
Monocytes Absolute: 0.9 10*3/uL (ref 0.1–1.0)
Monocytes Relative: 13 %
Neutro Abs: 5 10*3/uL (ref 1.7–7.7)
Neutrophils Relative %: 75 %
Platelet Count: 491 10*3/uL — ABNORMAL HIGH (ref 150–400)
RBC: 3.76 MIL/uL — ABNORMAL LOW (ref 3.87–5.11)
RDW: 14.1 % (ref 11.5–15.5)
WBC Count: 6.8 10*3/uL (ref 4.0–10.5)
nRBC: 0.3 % — ABNORMAL HIGH (ref 0.0–0.2)

## 2018-07-07 LAB — CMP (CANCER CENTER ONLY)
ALT: 6 U/L (ref 0–44)
AST: 9 U/L — ABNORMAL LOW (ref 15–41)
Albumin: 2.4 g/dL — ABNORMAL LOW (ref 3.5–5.0)
Alkaline Phosphatase: 61 U/L (ref 38–126)
Anion gap: 11 (ref 5–15)
BUN: 8 mg/dL (ref 8–23)
CO2: 32 mmol/L (ref 22–32)
Calcium: 9.1 mg/dL (ref 8.9–10.3)
Chloride: 99 mmol/L (ref 98–111)
Creatinine: 0.82 mg/dL (ref 0.44–1.00)
GFR, Est AFR Am: >60 mL/min (ref >60)
GFR, Estimated: >60 mL/min (ref >60)
Glucose, Bld: 114 mg/dL — ABNORMAL HIGH (ref 70–99)
Potassium: 3.8 mmol/L (ref 3.5–5.1)
Sodium: 142 mmol/L (ref 135–145)
Total Bilirubin: 0.4 mg/dL (ref 0.3–1.2)
Total Protein: 6.4 g/dL — ABNORMAL LOW (ref 6.5–8.1)

## 2018-07-07 LAB — SARS CORONAVIRUS 2 BY RT PCR (HOSPITAL ORDER, PERFORMED IN ~~LOC~~ HOSPITAL LAB): SARS Coronavirus 2: NEGATIVE

## 2018-07-07 MED ORDER — SODIUM CHLORIDE 0.9 % IV SOLN
Freq: Once | INTRAVENOUS | Status: AC
  Administered 2018-07-07: 13:00:00 via INTRAVENOUS
  Filled 2018-07-07: qty 250

## 2018-07-07 MED ORDER — SODIUM CHLORIDE 0.9% FLUSH
10.0000 mL | INTRAVENOUS | Status: DC | PRN
  Administered 2018-07-07: 10 mL
  Filled 2018-07-07: qty 10

## 2018-07-07 MED ORDER — PROCHLORPERAZINE MALEATE 10 MG PO TABS
10.0000 mg | ORAL_TABLET | Freq: Once | ORAL | Status: AC
  Administered 2018-07-07: 13:00:00 10 mg via ORAL

## 2018-07-07 MED ORDER — SODIUM CHLORIDE 0.9 % IV SOLN
1.4000 mg/m2 | Freq: Once | INTRAVENOUS | Status: AC
  Administered 2018-07-07: 14:00:00 2.8 mg via INTRAVENOUS
  Filled 2018-07-07: qty 5.6

## 2018-07-07 MED ORDER — SODIUM CHLORIDE 0.9% FLUSH
10.0000 mL | Freq: Once | INTRAVENOUS | Status: AC
  Administered 2018-07-07: 11:00:00 10 mL
  Filled 2018-07-07: qty 10

## 2018-07-07 MED ORDER — ALTEPLASE 2 MG IJ SOLR
6.0000 mg | Freq: Once | INTRAMUSCULAR | Status: DC
  Filled 2018-07-07: qty 6

## 2018-07-07 MED ORDER — HEPARIN SOD (PORK) LOCK FLUSH 100 UNIT/ML IV SOLN
500.0000 [IU] | Freq: Once | INTRAVENOUS | Status: AC | PRN
  Administered 2018-07-07: 14:00:00 500 [IU]
  Filled 2018-07-07: qty 5

## 2018-07-07 MED ORDER — PROCHLORPERAZINE MALEATE 10 MG PO TABS
ORAL_TABLET | ORAL | Status: AC
  Filled 2018-07-07: qty 1

## 2018-07-07 MED ORDER — ALTEPLASE 2 MG IJ SOLR: 6.0000 mg | Freq: Once | INTRAMUSCULAR | Status: DC

## 2018-07-07 NOTE — Patient Instructions (Signed)
[facility] Discharge Instructions for Patients Receiving Chemotherapy  Today you received the following chemotherapy agent: Halaven   To help prevent nausea and vomiting after your treatment, we encourage you to take your nausea medication as directed.   If you develop nausea and vomiting that is not controlled by your nausea medication, call the clinic.   BELOW ARE SYMPTOMS THAT SHOULD BE REPORTED IMMEDIATELY:  *FEVER GREATER THAN 100.5 F  *CHILLS WITH OR WITHOUT FEVER  NAUSEA AND VOMITING THAT IS NOT CONTROLLED WITH YOUR NAUSEA MEDICATION  *UNUSUAL SHORTNESS OF BREATH  *UNUSUAL BRUISING OR BLEEDING  TENDERNESS IN MOUTH AND THROAT WITH OR WITHOUT PRESENCE OF ULCERS  *URINARY PROBLEMS  *BOWEL PROBLEMS  UNUSUAL RASH Items with * indicate a potential emergency and should be followed up as soon as possible.  Feel free to call the clinic should you have any questions or concerns. The clinic phone number is [phone].  Please show the [facility] at check-in to the Emergency Department and triage nurse.  Eribulin solution for injection What is this medicine? ERIBULIN (er e bu lin) is a chemotherapy drug. It is used to treat breast cancer and liposarcoma. This medicine may be used for other purposes; ask your health care provider or pharmacist if you have questions. COMMON BRAND NAME(S): Halaven What should I tell my health care provider before I take this medicine? They need to know if you have any of these conditions: -heart disease -history of irregular heartbeat -kidney disease -liver disease -low blood counts, like low white cell, platelet, or red cell counts -low levels of potassium or magnesium in the blood -an unusual or allergic reaction to eribulin, other medicines, foods, dyes, or preservatives -pregnant or trying to get pregnant -breast-feeding How should I use this medicine? This medicine is for infusion into a vein. It is given by a  health care professional in a hospital or clinic setting. Talk to your pediatrician regarding the use of this medicine in children. Special care may be needed. Overdosage: If you think you have taken too much of this medicine contact a poison control center or emergency room at once. NOTE: This medicine is only for you. Do not share this medicine with others. What if I miss a dose? It is important not to miss your dose. Call your doctor or health care professional if you are unable to keep an appointment. What may interact with this medicine? Do not take this medicine with any of the following medications: -amiodarone -astemizole -arsenic trioxide -bepridil -bretylium -chloroquine -chlorpromazine -cisapride -clarithromycin -dextromethorphan, quinidine -disopyramide -dofetilide -droperidol -dronedarone -erythromycin -grepafloxacin -halofantrine -haloperidol -ibutilide -levomethadyl -mesoridazine -methadone -pentamidine -procainamide -quinidine -pimozide -posaconazole -probucol -propafenone -saquinavir -sotalol -sparfloxacin -terfenadine -thioridazine -troleandomycin -ziprasidone This list may not describe all possible interactions. Give your health care provider a list of all the medicines, herbs, non-prescription drugs, or dietary supplements you use. Also tell them if you smoke, drink alcohol, or use illegal drugs. Some items may interact with your medicine. What should I watch for while using this medicine? This drug may make you feel generally unwell. This is not uncommon, as chemotherapy can affect healthy cells as well as cancer cells. Report any side effects. Continue your course of treatment even though you feel ill unless your doctor tells you to stop. Call your doctor or health care professional for advice if you get a fever, chills or sore throat, or other symptoms of a cold or flu. Do not treat yourself. This drug decreases  your body's ability to fight  infections. Try to avoid being around people who are sick. This medicine may increase your risk to bruise or bleed. Call your doctor or health care professional if you notice any unusual bleeding. You may need blood work done while you are taking this medicine. Do not become pregnant while taking this medicine or for 2 weeks after stopping it. Women should inform their doctor if they wish to become pregnant or think they might be pregnant. Men should not father a child while taking this medicine and for 3.5 months after stopping it. There is a potential for serious side effects to an unborn child. Talk to your health care professional or pharmacist for more information. Do not breast-feed an infant while taking this medicine or for 2 weeks after stopping it. What side effects may I notice from receiving this medicine? Side effects that you should report to your doctor or health care professional as soon as possible: -allergic reactions like skin rash, itching or hives, swelling of the face, lips, or tongue -low blood counts - this medicine may decrease the number of white blood cells, red blood cells and platelets. You may be at increased risk for infections and bleeding. -signs of infection - fever or chills, cough, sore throat, pain or difficulty passing urine -signs of decreased platelets or bleeding - bruising, pinpoint red spots on the skin, black, tarry stools, blood in the urine -signs of decreased red blood cells - unusually weak or tired, fainting spells, lightheadedness -pain, tingling, numbness in the hands or feet Side effects that usually do not require medical attention (report to your doctor or health care professional if they continue or are bothersome): -constipation -hair loss -headache -loss of appetite -muscle or joint pain -nausea, vomiting -stomach pain This list may not describe all possible side effects. Call your doctor for medical advice about side effects. You may  report side effects to FDA at 1-800-FDA-1088. Where should I keep my medicine? This drug is given in a hospital or clinic and will not be stored at home. NOTE: This sheet is a summary. It may not cover all possible information. If you have questions about this medicine, talk to your doctor, pharmacist, or health care provider.  2019 Elsevier/Gold Standard (2015-01-24 10:11:26)

## 2018-07-07 NOTE — Progress Notes (Signed)
[facility] Follow up:    Deborah Dials, MD [address] 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   12/26/2014 Initial Diagnosis   Left breast biopsy: Invasive ductal carcinoma with lymphovascular invasion, 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, grade 3, ER/PR negative, Ki-67 80% HER-2 Neg   01/18/2015 - 06/21/2015 Neo-Adjuvant Chemotherapy   Dose dense Adriamycin and Cytoxan 4 followed by Taxol and carboplatin [id number]   07/03/2015 Breast MRI   Improvement in multiple areas of enhancement largest area 3.4 cm other suspicious nodules anterior to the mass also improved, left axillary internal mammary lymph nodes improved   08/23/2015 Surgery   Left mastectomy with axillary lymph node dissection: Invasive ductal carcinoma with calcifications grade 3, 1.3 cm, image, margins negative, 1/6 lymph nodes positive with extracapsular extension, ER 0%, 0%, T1cN1a stage II a    10/31/2015 - 01/08/2016 Radiation Therapy   Adj XRT Lisbeth Renshaw): Left Chest Wall and Left SCLV treated to 50.4 Gy in 28 fractions of 1.8 Gy. Left Chest Wall was then boosted to 60.4 Gy in 5 fractions of 2 Gy.   02/03/2016 - 08/03/2016 Chemotherapy   Xeloda 1500 mg by mouth twice a day 2 weeks on one week off   02/04/2017 - 02/10/2017 Hospital Admission   Admitted for bilateral pneumonia   05/13/2018 Relapse/Recurrence   Left pleural effusion  with left upper lobe consolidation and paratracheal and AP window lymphadenopathy: Thoracentesis: Cytology, adenocarcinoma consistent with breast primary, positive for GA TA-3 and CK7, negative for CK20 and TTF-1, ER PR HER-2 negative   06/01/2018 - 06/03/2018 Hospital Admission   Admitted for chest pain and shortness of breath status post thoracentesis 2.7 L removed and Pleurx catheter was placed cytology positive for adenocarcinoma   06/09/2018 -  Chemotherapy   The patient had eriBULin mesylate (HALAVEN) 2.8 mg in sodium chloride 0.9 % 100 mL chemo infusion, 1.4 mg/m2 = 2.8 mg, Intravenous,  Once, 2 of 4 cycles Administration: 2.8 mg (06/09/2018), 2.8 mg (06/24/2018)  for chemotherapy treatment.    06/16/2018 Miscellaneous   [facility] molecular testing: MMR proficient, TMB indeterminate, triple negative, ER negative, insufficient material for PDL 1, PTEN IHC positive mutation not detected     CURRENT THERAPY: Halaven given every other week  INTERVAL HISTORY: Deborah Mcintyre 63 y.o. female returns for evaluation of her metastatic breast cancer.  She was recently in the hospital with increased pleural effusion due to a malfunctioning pleurx.  During her hospitalization they administered TPA to the pleurx and were able to draw off 1.6 liters.  She was instructed to use the pleurx regularly at home.  She notes that once she got home, she started having issues with the pleurx draining again.  She notes that there is leaking from around the pleurx and she is having difficulty with drawing off fluid again.  She has not had any follow up with IR.  She is becoming  more symptomatic with cough, shortness of breath, and pleuritic chest pain.    Patient Active Problem List   Diagnosis Date Noted  . Loculated pleural effusion 06/30/2018  . Hypokalemia 06/30/2018  . Port-A-Cath in place 06/24/2018  . Status post chest tube placement ([place])   . Tobacco abuse 06/01/2018  . Prolonged QT interval 06/01/2018  .  Chest pain 06/01/2018  . Elevated troponin 06/01/2018  . Cigarette smoker 05/27/2018  . Goals of care, counseling/discussion 05/19/2018  . 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  . Hypertension 05/12/2018  . COPD (chronic obstructive pulmonary disease) ([place]) 05/12/2018  . Anxiety and depression 05/12/2018  . Community acquired pneumonia of left lung 02/04/2017  . Antineoplastic chemotherapy induced pancytopenia ([place]) 04/26/2015  . Antineoplastic chemotherapy induced anemia 04/05/2015  . Breast cancer of upper-outer quadrant of left female breast ([place]) 01/01/2015  . Acute exacerbation of chronic bronchitis ([place]) 08/03/2013    is allergic to fentanyl and morphine and related.  MEDICAL HISTORY: Past Medical History:  Diagnosis Date  . Anxiety   . Arthritis   . Bronchitis   . COPD (chronic obstructive pulmonary disease) ([place])   . Depression   . Hypertension   . Malignant neoplasm of upper-outer quadrant of left female breast ([place]) 01/04/2015  . Nocturia   . Numbness and tingling    toes - bilateral  . Pneumonia   . PONV (postoperative nausea and vomiting)    Nausea    SURGICAL HISTORY: Past Surgical History:  Procedure Laterality Date  . BREAST LUMPECTOMY Bilateral    x3  . BREAST LUMPECTOMY WITH RADIOACTIVE SEED LOCALIZATION Right 08/23/2015   Procedure: RIGHT BREAST LUMPECTOMY WITH RADIOACTIVE SEED LOCALIZATION;  Surgeon: Alphonsa Overall, MD;  Location: [facility];  Service: General;  Laterality: Right;  . CESAREAN SECTION     x4  . IR GUIDED DRAIN W CATHETER PLACEMENT  06/02/2018  . IR IMAGING GUIDED PORT INSERTION  05/25/2018  . IR REMOVAL TUN ACCESS W/ PORT W/O FL MOD SED  10/20/2016  . IR THORACENTESIS ASP PLEURAL SPACE W/IMG GUIDE  05/12/2018  . MASTECTOMY Left 08/23/2015    RIGHT BREAST LUMPECTOMY WITH RADIOACTIVE SEED LOCALIZATION,  LEFT MASTECTOMY WITH AXILLARY LYMPH NODE DISSECTION  . MASTECTOMY WITH AXILLARY LYMPH NODE DISSECTION Left 08/23/2015   Procedure: LEFT  MASTECTOMY WITH AXILLARY LYMPH NODE DISSECTION;  Surgeon: Alphonsa Overall, MD;  Location: [facility];  Service: General;  Laterality: Left;  [name] MULTIPLE TOOTH EXTRACTIONS      SOCIAL HISTORY: Social History   Socioeconomic History  . Marital status: Single    Spouse name: Not on file  . Number of children: Not on file  . Years of education: Not on file  . Highest education level: Not on file  Occupational History  . Not on file  Social Needs  . Financial resource strain: Not on file  . Food insecurity    Worry: Not on file    Inability: Not on file  . Transportation needs    Medical: Not on file    Non-medical: Not on file  Tobacco Use  . Smoking status: Current Every Day Smoker    Packs/day: 0.75    Years: 45.00    Pack years: 33.75    Types: Cigarettes  . Smokeless tobacco: Never Used  Substance and Sexual Activity  . Alcohol use: Yes    Comment: ocassionally  . Drug use: No  . Sexual activity: Not on file  Lifestyle  . Physical activity    Days per week:  Not on file    Minutes per session: Not on file  . Stress: Not on file  Relationships  . Social Herbalist on phone: Not on file    Gets together: Not on file    Attends religious service: Not on file    Active member of club or organization: Not on file    Attends meetings of clubs or organizations: Not on file    Relationship status: Not on file  . Intimate partner violence    Fear of current or ex partner: Not on file    Emotionally abused: Not on file    Physically abused: Not on file    Forced sexual activity: Not on file  Other Topics Concern  . Not on file  Social History Narrative  . Not on file    FAMILY HISTORY: Family History  Problem Relation Age of Onset  . Breast cancer Maternal Aunt     Review of Systems  Constitutional: Positive for fatigue and unexpected weight change. Negative for appetite change, chills and diaphoresis.  HENT:   Negative for hearing loss, lump/mass, sore throat  and trouble swallowing.   Eyes: Negative for eye problems.  Respiratory: Positive for cough and shortness of breath. Negative for chest tightness.   Cardiovascular: Positive for chest pain (with breathing). Negative for palpitations.  Gastrointestinal: Negative for abdominal distention, abdominal pain, constipation, diarrhea, nausea and vomiting.  Endocrine: Negative for hot flashes.  Musculoskeletal: Negative for arthralgias.  Skin: Negative for itching and rash.  Neurological: Negative for dizziness, extremity weakness, headaches and numbness.  Hematological: Negative for adenopathy. Does not bruise/bleed easily.  Psychiatric/Behavioral: Negative for depression. The patient is not nervous/anxious.       PHYSICAL EXAMINATION  ECOG PERFORMANCE STATUS: 1 - Symptomatic but completely ambulatory  Vitals:   07/07/18 1154  BP: 101/73  Pulse: 90  Resp: 18  Temp: 97.9 F (36.6 C)  SpO2: 99%    Physical Exam Constitutional:      General: She is not in acute distress.    Appearance: Normal appearance. She is not toxic-appearing.  HENT:     Head: Normocephalic and atraumatic.     Nose: No congestion.     Mouth/Throat:     Mouth: Mucous membranes are moist.     Pharynx: Oropharynx is clear. No oropharyngeal exudate or posterior oropharyngeal erythema.  Eyes:     General: No scleral icterus.    Pupils: Pupils are equal, round, and reactive to light.  Neck:     Musculoskeletal: Neck supple.  Cardiovascular:     Rate and Rhythm: Normal rate and regular rhythm.     Pulses: Normal pulses.     Heart sounds: Normal heart sounds.  Pulmonary:     Effort: Pulmonary effort is normal.     Comments: Left lung breath sounds diminished until left upper lobe, right lung is clear Abdominal:     General: Abdomen is flat. Bowel sounds are normal. There is no distension.     Palpations: Abdomen is soft.     Tenderness: There is no abdominal tenderness.  Musculoskeletal:        General: No  swelling.  Lymphadenopathy:     Cervical: No cervical adenopathy.  Skin:    General: Skin is warm and dry.     Capillary Refill: Capillary refill takes less than 2 seconds.     Findings: No rash.  Neurological:     General: No focal deficit present.  Mental Status: She is alert.  Psychiatric:        Mood and Affect: Mood normal.        Behavior: Behavior normal.     LABORATORY DATA:  CBC    Component Value Date/Time   WBC 6.8 07/07/2018 1117   WBC 1.9 (L) 07/02/2018 0508   RBC 3.76 (L) 07/07/2018 1117   HGB 10.6 (L) 07/07/2018 1117   HGB 14.0 01/07/2017 1308   HCT 34.4 (L) 07/07/2018 1117   HCT 43.5 01/07/2017 1308   PLT 491 (H) 07/07/2018 1117   PLT 142 (L) 01/07/2017 1308   MCV 91.5 07/07/2018 1117   MCV 101.2 (H) 01/07/2017 1308   MCH 28.2 07/07/2018 1117   MCHC 30.8 07/07/2018 1117   RDW 14.1 07/07/2018 1117   RDW 13.1 01/07/2017 1308   LYMPHSABS 0.7 07/07/2018 1117   LYMPHSABS 1.1 01/07/2017 1308   MONOABS 0.9 07/07/2018 1117   MONOABS 0.4 01/07/2017 1308   EOSABS 0.0 07/07/2018 1117   EOSABS 0.1 01/07/2017 1308   BASOSABS 0.0 07/07/2018 1117   BASOSABS 0.0 01/07/2017 1308    CMP     Component Value Date/Time   NA 142 07/07/2018 1117   NA 142 01/07/2017 1308   K 3.8 07/07/2018 1117   K 3.9 01/07/2017 1308   CL 99 07/07/2018 1117   CO2 32 07/07/2018 1117   CO2 26 01/07/2017 1308   GLUCOSE 114 (H) 07/07/2018 1117   GLUCOSE 107 01/07/2017 1308   BUN 8 07/07/2018 1117   BUN 9.7 01/07/2017 1308   CREATININE 0.82 07/07/2018 1117   CREATININE 0.8 01/07/2017 1308   CALCIUM 9.1 07/07/2018 1117   CALCIUM 9.2 01/07/2017 1308   PROT 6.4 (L) 07/07/2018 1117   PROT 7.4 01/07/2017 1308   ALBUMIN 2.4 (L) 07/07/2018 1117   ALBUMIN 3.9 01/07/2017 1308   AST 9 (L) 07/07/2018 1117   AST 12 01/07/2017 1308   ALT 6 07/07/2018 1117   ALT 8 01/07/2017 1308   ALKPHOS 61 07/07/2018 1117   ALKPHOS 69 01/07/2017 1308   BILITOT 0.4 07/07/2018 1117   BILITOT 0.40  01/07/2017 1308   GFRNONAA >60 07/07/2018 1117   GFRAA >60 07/07/2018 1117           ASSESSMENT and THERAPY PLAN:   Breast cancer of upper-outer quadrant of left female breast ([place]) 12/26/14: inflammatory breast cancerInvasive ductal carcinoma with lymphovascular invasion, 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  Patient received neoadjuvant chemotherapy with dose dense Adriamycin and Cytoxan followed by Taxol and carboplatin followed by left mastectomy and axillary lymph node dissection 08/23/2015 followed by adjuvant radiation completed 01/08/2016  Metastatic breast cancer: 05/13/2018:Left pleural effusion with left upper lobe consolidation and paratracheal and AP window lymphadenopathy: Thoracentesis: Cytology, adenocarcinoma consistent with breast primary, positive for GA TA-3 and CK7, negative for CK20 and TTF-1, ER PR HER-2 negative.  [facility] molecular testing: PDL 1 insufficient tissue, no other actionable mutations PET CT scan: 08/22/5629: Hypermetabolic lymph nodes right thoracic inlet, mediastinum both hilar regions, left upper lobe mass hypermetabolic, pleural nodule, pleural activity, opacities peripheral right lung.  Current treatment: Palliative chemotherapy with Halaven every 2 weeks today cycle 1 day 15   Cheyann is tolerating treatment well.  Her main issue is the recurrent pleural effusion  and pleurx catheter issue.  I spoke with [name] MD in IR today.  I placed orders for US thoracentesis.  Patient will go over to IR once she finished chemotherapy.  Her last COVID test was on 06/30/2018.  She was recommended to keep up with social distancing and we will see her back in 2 weeks for her next treatment.     Orders Placed This Encounter  Procedures  . DG Chest 2  View    Standing Status:   Future    Standing Expiration Date:   07/07/2019    Order Specific Question:   Reason for Exam (SYMPTOM  OR DIAGNOSIS REQUIRED)    Answer:   recurrent pleural effusion, shortness of breath, cough    Order Specific Question:   Preferred imaging location?    Answer:   [facility]    Order Specific Question:   Radiology Contrast Protocol - do NOT remove file path    Answer:   [email or web address]  . US Thoracentesis Asp Pleural space w/IMG guide    Standing Status:   Future    Standing Expiration Date:   07/07/2019    Order Specific Question:   Are labs required for specimen collection?    Answer:   No    Order Specific Question:   Reason for Exam (SYMPTOM  OR DIAGNOSIS REQUIRED)    Answer:   recurrent pleural effusion    Order Specific Question:   Preferred imaging location?    Answer:   [facility]    All questions were answered. The patient knows to call the clinic with any problems, questions or concerns. We can certainly see the patient much sooner if necessary.  A total of (30) minutes of face-to-face time was spent with this patient with greater than 50% of that time in counseling and care-coordination.  This note was electronically signed. Scot Dock, NP 07/07/2018

## 2018-07-07 NOTE — Procedures (Signed)
Pre procedural Dx: Symptomatic Pleural effusion Post procedural Dx: Same  Successful aspiration of 700 cc of slightly blood-tinged pleural fluid from the appropriately functioning left-sided tunneled pleural drainage catheter.    No additional intervention required for appropriately functioned tunneled pleural drain.   EBL: None Complications: None immediate.  [name], MD Pager #: [phone]

## 2018-07-07 NOTE — Assessment & Plan Note (Addendum)
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  Patient received neoadjuvant chemotherapy with dose dense Adriamycin and Cytoxan followed by Taxol and carboplatin followed by left mastectomy and axillary lymph node dissection 08/23/2015 followed by adjuvant radiation completed 01/08/2016  Metastatic breast cancer: 05/13/2018:Left pleural effusion with left upper lobe consolidation and paratracheal and AP window lymphadenopathy: Thoracentesis: Cytology, adenocarcinoma consistent with breast primary, positive for GA TA-3 and CK7, negative for CK20 and TTF-1, ER PR HER-2 negative.  [facility] molecular testing: PDL 1 insufficient tissue, no other actionable mutations PET CT scan: 8/76/8115: Hypermetabolic lymph nodes right thoracic inlet, mediastinum both hilar regions, left upper lobe mass hypermetabolic, pleural nodule, pleural activity, opacities peripheral right lung.  Current treatment: Palliative chemotherapy with Halaven every 2 weeks today cycle 1 day 15   Deborah Mcintyre is tolerating treatment well.  Her main issue is the recurrent pleural effusion and pleurx catheter issue.  I spoke with [name] MD in IR today.  I placed orders for US thoracentesis.  Patient will go over to IR once she finished chemotherapy.  Her last COVID test was on 06/30/2018.  She was recommended to keep up with social distancing and we will see her back in 2 weeks for her next treatment.

## 2018-07-12 ENCOUNTER — Telehealth: Payer: Self-pay

## 2018-07-12 NOTE — Telephone Encounter (Signed)
RN placed call to follow up with PleurX drain.  Contact information left for return call.

## 2018-07-13 ENCOUNTER — Telehealth: Payer: Self-pay

## 2018-07-13 NOTE — Telephone Encounter (Signed)
RN left voicemail for patient to return call regarding update on drain.

## 2018-07-14 ENCOUNTER — Other Ambulatory Visit: Payer: Medicare Other

## 2018-07-14 ENCOUNTER — Inpatient Hospital Stay (HOSPITAL_COMMUNITY)
Admission: EM | Admit: 2018-07-14 | Discharge: 2018-07-18 | DRG: 193 | Disposition: A | Discharge status: Home Health | Payer: Medicare Other | Attending: Internal Medicine | Admitting: Internal Medicine

## 2018-07-14 ENCOUNTER — Ambulatory Visit: Payer: Medicare Other

## 2018-07-14 ENCOUNTER — Emergency Department (HOSPITAL_COMMUNITY): Payer: Medicare Other

## 2018-07-14 ENCOUNTER — Encounter (HOSPITAL_COMMUNITY): Payer: Self-pay

## 2018-07-14 ENCOUNTER — Ambulatory Visit: Payer: Medicare Other | Admitting: Hematology and Oncology

## 2018-07-14 ENCOUNTER — Other Ambulatory Visit: Payer: Self-pay

## 2018-07-14 DIAGNOSIS — E440 Moderate protein-calorie malnutrition: Secondary | ICD-10-CM | POA: Diagnosis present

## 2018-07-14 DIAGNOSIS — J9610 Chronic respiratory failure, unspecified whether with hypoxia or hypercapnia: Secondary | ICD-10-CM | POA: Diagnosis present

## 2018-07-14 DIAGNOSIS — J449 Chronic obstructive pulmonary disease, unspecified: Secondary | ICD-10-CM | POA: Diagnosis not present

## 2018-07-14 DIAGNOSIS — B370 Candidal stomatitis: Secondary | ICD-10-CM | POA: Diagnosis present

## 2018-07-14 DIAGNOSIS — C50412 Malignant neoplasm of upper-outer quadrant of left female breast: Secondary | ICD-10-CM | POA: Diagnosis not present

## 2018-07-14 DIAGNOSIS — M1990 Unspecified osteoarthritis, unspecified site: Secondary | ICD-10-CM | POA: Diagnosis present

## 2018-07-14 DIAGNOSIS — J440 Chronic obstructive pulmonary disease with acute lower respiratory infection: Secondary | ICD-10-CM | POA: Diagnosis present

## 2018-07-14 DIAGNOSIS — D61810 Antineoplastic chemotherapy induced pancytopenia: Secondary | ICD-10-CM | POA: Diagnosis present

## 2018-07-14 DIAGNOSIS — Z885 Allergy status to narcotic agent status: Secondary | ICD-10-CM | POA: Diagnosis not present

## 2018-07-14 DIAGNOSIS — Z7982 Long term (current) use of aspirin: Secondary | ICD-10-CM

## 2018-07-14 DIAGNOSIS — I10 Essential (primary) hypertension: Secondary | ICD-10-CM | POA: Diagnosis present

## 2018-07-14 DIAGNOSIS — Z79899 Other long term (current) drug therapy: Secondary | ICD-10-CM

## 2018-07-14 DIAGNOSIS — J910 Malignant pleural effusion: Secondary | ICD-10-CM | POA: Diagnosis not present

## 2018-07-14 DIAGNOSIS — Z7952 Long term (current) use of systemic steroids: Secondary | ICD-10-CM

## 2018-07-14 DIAGNOSIS — F329 Major depressive disorder, single episode, unspecified: Secondary | ICD-10-CM | POA: Diagnosis present

## 2018-07-14 DIAGNOSIS — Z720 Tobacco use: Secondary | ICD-10-CM | POA: Diagnosis present

## 2018-07-14 DIAGNOSIS — D701 Agranulocytosis secondary to cancer chemotherapy: Secondary | ICD-10-CM

## 2018-07-14 DIAGNOSIS — J189 Pneumonia, unspecified organism: Secondary | ICD-10-CM | POA: Diagnosis present

## 2018-07-14 DIAGNOSIS — Y95 Nosocomial condition: Secondary | ICD-10-CM | POA: Diagnosis present

## 2018-07-14 DIAGNOSIS — J984 Other disorders of lung: Secondary | ICD-10-CM | POA: Diagnosis present

## 2018-07-14 DIAGNOSIS — Z803 Family history of malignant neoplasm of breast: Secondary | ICD-10-CM | POA: Diagnosis not present

## 2018-07-14 DIAGNOSIS — D6481 Anemia due to antineoplastic chemotherapy: Secondary | ICD-10-CM | POA: Diagnosis present

## 2018-07-14 DIAGNOSIS — Z9012 Acquired absence of left breast and nipple: Secondary | ICD-10-CM

## 2018-07-14 DIAGNOSIS — E876 Hypokalemia: Secondary | ICD-10-CM | POA: Diagnosis present

## 2018-07-14 DIAGNOSIS — Z6824 Body mass index (BMI) 24.0-24.9, adult: Secondary | ICD-10-CM

## 2018-07-14 DIAGNOSIS — F419 Anxiety disorder, unspecified: Secondary | ICD-10-CM | POA: Diagnosis present

## 2018-07-14 DIAGNOSIS — F17210 Nicotine dependence, cigarettes, uncomplicated: Secondary | ICD-10-CM | POA: Diagnosis present

## 2018-07-14 DIAGNOSIS — Z171 Estrogen receptor negative status [ER-]: Secondary | ICD-10-CM | POA: Diagnosis not present

## 2018-07-14 DIAGNOSIS — T451X5A Adverse effect of antineoplastic and immunosuppressive drugs, initial encounter: Secondary | ICD-10-CM | POA: Diagnosis not present

## 2018-07-14 DIAGNOSIS — Z9981 Dependence on supplemental oxygen: Secondary | ICD-10-CM

## 2018-07-14 DIAGNOSIS — Z1159 Encounter for screening for other viral diseases: Secondary | ICD-10-CM | POA: Diagnosis not present

## 2018-07-14 DIAGNOSIS — R0602 Shortness of breath: Secondary | ICD-10-CM | POA: Diagnosis not present

## 2018-07-14 DIAGNOSIS — F32A Depression, unspecified: Secondary | ICD-10-CM | POA: Diagnosis present

## 2018-07-14 DIAGNOSIS — R000 Tachycardia, unspecified: Secondary | ICD-10-CM | POA: Diagnosis present

## 2018-07-14 DIAGNOSIS — T85698A Other mechanical complication of other specified internal prosthetic devices, implants and grafts, initial encounter: Secondary | ICD-10-CM

## 2018-07-14 LAB — CBC WITH DIFFERENTIAL/PLATELET
Abs Immature Granulocytes: 0.06 10*3/uL (ref 0.00–0.07)
Basophils Absolute: 0.1 10*3/uL (ref 0.0–0.1)
Basophils Relative: 3 %
Eosinophils Absolute: 0 10*3/uL (ref 0.0–0.5)
Eosinophils Relative: 0 %
HCT: 34.4 % — ABNORMAL LOW (ref 36.0–46.0)
Hemoglobin: 10.4 g/dL — ABNORMAL LOW (ref 12.0–15.0)
Immature Granulocytes: 4 %
Lymphocytes Relative: 45 %
Lymphs Abs: 0.7 10*3/uL (ref 0.7–4.0)
MCH: 28 pg (ref 26.0–34.0)
MCHC: 30.2 g/dL (ref 30.0–36.0)
MCV: 92.5 fL (ref 80.0–100.0)
Monocytes Absolute: 0.4 10*3/uL (ref 0.1–1.0)
Monocytes Relative: 24 %
Neutro Abs: 0.4 10*3/uL — ABNORMAL LOW (ref 1.7–7.7)
Neutrophils Relative %: 24 %
Platelets: 369 10*3/uL (ref 150–400)
RBC: 3.72 MIL/uL — ABNORMAL LOW (ref 3.87–5.11)
RDW: 13.7 % (ref 11.5–15.5)
WBC: 1.5 10*3/uL — ABNORMAL LOW (ref 4.0–10.5)
nRBC: 0 % (ref 0.0–0.2)

## 2018-07-14 LAB — COMPREHENSIVE METABOLIC PANEL
ALT: 9 U/L (ref 0–44)
AST: 12 U/L — ABNORMAL LOW (ref 15–41)
Albumin: 2.7 g/dL — ABNORMAL LOW (ref 3.5–5.0)
Alkaline Phosphatase: 49 U/L (ref 38–126)
Anion gap: 14 (ref 5–15)
BUN: 9 mg/dL (ref 8–23)
CO2: 28 mmol/L (ref 22–32)
Calcium: 8.5 mg/dL — ABNORMAL LOW (ref 8.9–10.3)
Chloride: 98 mmol/L (ref 98–111)
Creatinine, Ser: 0.7 mg/dL (ref 0.44–1.00)
GFR calc Af Amer: >60 mL/min (ref >60)
GFR calc non Af Amer: >60 mL/min (ref >60)
Glucose, Bld: 89 mg/dL (ref 70–99)
Potassium: 3.2 mmol/L — ABNORMAL LOW (ref 3.5–5.1)
Sodium: 140 mmol/L (ref 135–145)
Total Bilirubin: 0.6 mg/dL (ref 0.3–1.2)
Total Protein: 6.1 g/dL — ABNORMAL LOW (ref 6.5–8.1)

## 2018-07-14 LAB — SARS CORONAVIRUS 2 BY RT PCR (HOSPITAL ORDER, PERFORMED IN ~~LOC~~ HOSPITAL LAB): SARS Coronavirus 2: NEGATIVE

## 2018-07-14 MED ORDER — BENZONATATE 100 MG PO CAPS
200.0000 mg | ORAL_CAPSULE | Freq: Three times a day (TID) | ORAL | Status: AC
  Administered 2018-07-15 – 2018-07-17 (×7): 200 mg via ORAL
  Filled 2018-07-14 (×9): qty 2

## 2018-07-14 MED ORDER — SODIUM CHLORIDE 0.9 % IV BOLUS
1000.0000 mL | Freq: Once | INTRAVENOUS | Status: AC
  Administered 2018-07-14: 1000 mL via INTRAVENOUS

## 2018-07-14 MED ORDER — PAROXETINE HCL 20 MG PO TABS
20.0000 mg | ORAL_TABLET | Freq: Two times a day (BID) | ORAL | Status: DC
  Administered 2018-07-14 – 2018-07-18 (×8): 20 mg via ORAL
  Filled 2018-07-14 (×8): qty 1

## 2018-07-14 MED ORDER — ENOXAPARIN SODIUM 40 MG/0.4ML ~~LOC~~ SOLN
40.0000 mg | SUBCUTANEOUS | Status: DC
  Administered 2018-07-14 – 2018-07-17 (×4): 40 mg via SUBCUTANEOUS
  Filled 2018-07-14 (×4): qty 0.4

## 2018-07-14 MED ORDER — ASPIRIN EC 81 MG PO TBEC
81.0000 mg | DELAYED_RELEASE_TABLET | Freq: Every day | ORAL | Status: DC
  Administered 2018-07-15 – 2018-07-18 (×4): 81 mg via ORAL
  Filled 2018-07-14 (×4): qty 1

## 2018-07-14 MED ORDER — SODIUM CHLORIDE 0.9 % IV SOLN
2.0000 g | Freq: Once | INTRAVENOUS | Status: AC
  Administered 2018-07-14: 2 g via INTRAVENOUS
  Filled 2018-07-14: qty 2

## 2018-07-14 MED ORDER — LEVALBUTEROL HCL 0.63 MG/3ML IN NEBU: 0.6300 mg | INHALATION_SOLUTION | Freq: Four times a day (QID) | RESPIRATORY_TRACT | Status: DC | PRN

## 2018-07-14 MED ORDER — HYDROCOD POLST-CPM POLST ER 10-8 MG/5ML PO SUER
5.0000 mL | Freq: Two times a day (BID) | ORAL | Status: AC
  Administered 2018-07-14 – 2018-07-16 (×4): 5 mL via ORAL
  Filled 2018-07-14 (×4): qty 5

## 2018-07-14 MED ORDER — IPRATROPIUM BROMIDE 0.02 % IN SOLN
0.5000 mg | Freq: Three times a day (TID) | RESPIRATORY_TRACT | Status: DC
  Administered 2018-07-15 – 2018-07-16 (×4): 0.5 mg via RESPIRATORY_TRACT
  Filled 2018-07-14 (×4): qty 2.5

## 2018-07-14 MED ORDER — IPRATROPIUM BROMIDE 0.02 % IN SOLN
0.5000 mg | Freq: Three times a day (TID) | RESPIRATORY_TRACT | Status: DC
  Administered 2018-07-14: 0.5 mg via RESPIRATORY_TRACT
  Filled 2018-07-14: qty 2.5

## 2018-07-14 MED ORDER — HYDROMORPHONE HCL 1 MG/ML IJ SOLN
0.5000 mg | Freq: Once | INTRAMUSCULAR | Status: AC
  Administered 2018-07-14: 0.5 mg via INTRAVENOUS
  Filled 2018-07-14: qty 1

## 2018-07-14 MED ORDER — MORPHINE SULFATE (PF) 2 MG/ML IV SOLN: 2.0000 mg | INTRAVENOUS | Status: DC | PRN

## 2018-07-14 MED ORDER — AMLODIPINE BESYLATE 5 MG PO TABS
5.0000 mg | ORAL_TABLET | Freq: Every day | ORAL | Status: DC
  Administered 2018-07-14 – 2018-07-18 (×5): 5 mg via ORAL
  Filled 2018-07-14 (×5): qty 1

## 2018-07-14 MED ORDER — LEVALBUTEROL HCL 0.63 MG/3ML IN NEBU
0.6300 mg | INHALATION_SOLUTION | Freq: Three times a day (TID) | RESPIRATORY_TRACT | Status: DC
  Administered 2018-07-14: 0.63 mg via RESPIRATORY_TRACT
  Filled 2018-07-14: qty 3

## 2018-07-14 MED ORDER — POTASSIUM CHLORIDE CRYS ER 20 MEQ PO TBCR
40.0000 meq | EXTENDED_RELEASE_TABLET | Freq: Once | ORAL | Status: AC
  Administered 2018-07-14: 21:00:00 40 meq via ORAL
  Filled 2018-07-14: qty 2

## 2018-07-14 MED ORDER — SODIUM CHLORIDE 0.9 % IV SOLN
2.0000 g | Freq: Three times a day (TID) | INTRAVENOUS | Status: DC
  Administered 2018-07-15 – 2018-07-17 (×8): 2 g via INTRAVENOUS
  Filled 2018-07-14 (×9): qty 2

## 2018-07-14 MED ORDER — NICOTINE 21 MG/24HR TD PT24
21.0000 mg | MEDICATED_PATCH | TRANSDERMAL | Status: DC
  Administered 2018-07-15 – 2018-07-17 (×3): 21 mg via TRANSDERMAL
  Filled 2018-07-14 (×3): qty 1

## 2018-07-14 MED ORDER — OXYCODONE-ACETAMINOPHEN 5-325 MG PO TABS
1.0000 | ORAL_TABLET | Freq: Four times a day (QID) | ORAL | Status: DC | PRN
  Administered 2018-07-15 – 2018-07-17 (×6): 1 via ORAL
  Filled 2018-07-14 (×6): qty 1

## 2018-07-14 MED ORDER — METRONIDAZOLE IN NACL 5-0.79 MG/ML-% IV SOLN
500.0000 mg | Freq: Once | INTRAVENOUS | Status: AC
  Administered 2018-07-15: 500 mg via INTRAVENOUS
  Filled 2018-07-14 (×3): qty 100

## 2018-07-14 MED ORDER — ACETAMINOPHEN 325 MG PO TABS: 650.0000 mg | ORAL_TABLET | Freq: Four times a day (QID) | ORAL | Status: DC | PRN

## 2018-07-14 MED ORDER — LIDOCAINE-PRILOCAINE 2.5-2.5 % EX CREA
1.0000 "application " | TOPICAL_CREAM | Freq: Every day | CUTANEOUS | Status: DC | PRN
  Filled 2018-07-14: qty 5

## 2018-07-14 MED ORDER — METOPROLOL TARTRATE 25 MG PO TABS
25.0000 mg | ORAL_TABLET | Freq: Two times a day (BID) | ORAL | Status: DC
  Administered 2018-07-14 – 2018-07-18 (×8): 25 mg via ORAL
  Filled 2018-07-14 (×8): qty 1

## 2018-07-14 MED ORDER — LEVALBUTEROL TARTRATE 45 MCG/ACT IN AERO: 2.0000 | INHALATION_SPRAY | Freq: Three times a day (TID) | RESPIRATORY_TRACT | Status: DC

## 2018-07-14 MED ORDER — HYDROMORPHONE HCL 1 MG/ML IJ SOLN
0.5000 mg | INTRAMUSCULAR | Status: AC | PRN
  Administered 2018-07-14 – 2018-07-15 (×3): 0.5 mg via INTRAVENOUS
  Filled 2018-07-14 (×3): qty 0.5

## 2018-07-14 MED ORDER — LEVALBUTEROL HCL 0.63 MG/3ML IN NEBU
0.6300 mg | INHALATION_SOLUTION | Freq: Three times a day (TID) | RESPIRATORY_TRACT | Status: DC
  Administered 2018-07-15 – 2018-07-16 (×4): 0.63 mg via RESPIRATORY_TRACT
  Filled 2018-07-14 (×4): qty 3

## 2018-07-14 MED ORDER — IPRATROPIUM BROMIDE HFA 17 MCG/ACT IN AERS: 2.0000 | INHALATION_SPRAY | Freq: Three times a day (TID) | RESPIRATORY_TRACT | Status: DC

## 2018-07-14 MED ORDER — SENNOSIDES-DOCUSATE SODIUM 8.6-50 MG PO TABS
2.0000 | ORAL_TABLET | Freq: Two times a day (BID) | ORAL | Status: DC
  Administered 2018-07-15 – 2018-07-16 (×3): 2 via ORAL
  Filled 2018-07-14 (×7): qty 2

## 2018-07-14 MED ORDER — PROMETHAZINE HCL 25 MG/ML IJ SOLN: 12.5000 mg | Freq: Three times a day (TID) | INTRAMUSCULAR | Status: DC | PRN

## 2018-07-14 MED ORDER — SODIUM CHLORIDE 0.9 % IV SOLN: 2.0000 g | INTRAVENOUS | Status: DC

## 2018-07-14 MED ORDER — ENSURE ENLIVE PO LIQD
237.0000 mL | Freq: Two times a day (BID) | ORAL | Status: DC
  Administered 2018-07-15: 237 mL via ORAL

## 2018-07-14 MED ORDER — ONDANSETRON HCL 4 MG/2ML IJ SOLN
4.0000 mg | Freq: Once | INTRAMUSCULAR | Status: AC
  Administered 2018-07-14: 16:00:00 4 mg via INTRAVENOUS
  Filled 2018-07-14: qty 2

## 2018-07-14 MED ORDER — GABAPENTIN 300 MG PO CAPS
300.0000 mg | ORAL_CAPSULE | Freq: Three times a day (TID) | ORAL | Status: DC
  Administered 2018-07-14 – 2018-07-16 (×7): 300 mg via ORAL
  Filled 2018-07-14 (×8): qty 1

## 2018-07-14 MED ORDER — ALPRAZOLAM 0.5 MG PO TABS: 0.5000 mg | ORAL_TABLET | Freq: Three times a day (TID) | ORAL | Status: DC | PRN

## 2018-07-14 MED ORDER — MAGNESIUM SULFATE 2 GM/50ML IV SOLN
2.0000 g | Freq: Once | INTRAVENOUS | Status: AC
  Administered 2018-07-14: 2 g via INTRAVENOUS
  Filled 2018-07-14: qty 50

## 2018-07-14 NOTE — ED Triage Notes (Signed)
Pt BIB EMS form home. Pt has stage 4 left metastatic lung cancer with effusion. Pt has a Pleurex catheter left side that patient says is not draining. Pt c/o SOB. Pt room air 96%. Pt has O2 at home for comfort and states she has not been needing it.

## 2018-07-14 NOTE — ED Notes (Signed)
Report given to [place], [place]

## 2018-07-14 NOTE — ED Provider Notes (Signed)
[facility] DEPT Provider Note   CSN: [id number] Arrival date & time: 07/14/18  1459    History   Chief Complaint Chief Complaint  Patient presents with  . Shortness of Breath    HPI Deborah Mcintyre is a 63 y.o. female.     63 yo F with a cc of chest pain.  This been going on for the past week or so.  Patient has stage IV breast cancer and has had a recurrent left-sided pleural effusion.  She also has been having trouble accessing her Pleurx catheter.  Denies increased cough denies sputum production denies fever.  Has had some nausea and vomiting and has had difficulty tolerating oral intake at home.  She denies abdominal pain.  Denies hemoptysis.  She has had pain like this before with her cancer.  She thinks is the same though may be worse.  Describes it is mostly left-sided sharp but also goes to the right side.  The history is provided by the patient.  Shortness of Breath Associated symptoms: chest pain   Associated symptoms: no fever, no headaches, no vomiting and no wheezing   Illness Severity:  Moderate Onset quality:  Gradual Duration:  1 week Timing:  Constant Progression:  Worsening Chronicity:  Recurrent Associated symptoms: chest pain and shortness of breath   Associated symptoms: no congestion, no fever, no headaches, no myalgias, no nausea, no rhinorrhea, no vomiting and no wheezing     Past Medical History:  Diagnosis Date  . Anxiety   . Arthritis   . Bronchitis   . COPD (chronic obstructive pulmonary disease) ([place])   . Depression   . Hypertension   . Malignant neoplasm of upper-outer quadrant of left female breast ([place]) 01/04/2015  . Nocturia   . Numbness and tingling    toes - bilateral  . Pneumonia   . PONV (postoperative nausea and vomiting)    Nausea    Patient Active Problem List   Diagnosis Date Noted  . HCAP (healthcare-associated pneumonia) 07/14/2018  . Loculated pleural effusion 06/30/2018  .  Hypokalemia 06/30/2018  . Port-A-Cath in place 06/24/2018  . Status post chest tube placement ([place])   . Tobacco abuse 06/01/2018  . Prolonged QT interval 06/01/2018  . Chest pain 06/01/2018  . Elevated troponin 06/01/2018  . Cigarette smoker 05/27/2018  . Goals of care, counseling/discussion 05/19/2018  . 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  . Hypertension 05/12/2018  . COPD (chronic obstructive pulmonary disease) ([place]) 05/12/2018  . Anxiety and depression 05/12/2018  . Community acquired pneumonia of left lung 02/04/2017  . Antineoplastic chemotherapy induced pancytopenia ([place]) 04/26/2015  . Antineoplastic chemotherapy induced anemia 04/05/2015  . Breast cancer of upper-outer quadrant of left female breast ([place]) 01/01/2015  . Acute exacerbation of chronic bronchitis ([place]) 08/03/2013    Past Surgical History:  Procedure Laterality Date  . BREAST LUMPECTOMY Bilateral    x3  . BREAST LUMPECTOMY WITH RADIOACTIVE SEED LOCALIZATION Right 08/23/2015   Procedure: RIGHT BREAST LUMPECTOMY WITH RADIOACTIVE SEED LOCALIZATION;  Surgeon: Alphonsa Overall, MD;  Location: [facility];  Service: General;  Laterality: Right;  . CESAREAN SECTION     x4  . IR GUIDED DRAIN W CATHETER PLACEMENT  06/02/2018  . IR IMAGING GUIDED PORT INSERTION  05/25/2018  . IR REMOVAL TUN ACCESS W/ PORT W/O FL MOD SED  10/20/2016  . IR THORACENTESIS ASP PLEURAL SPACE W/IMG GUIDE  05/12/2018  . IR THORACENTESIS ASP PLEURAL SPACE W/IMG GUIDE  07/07/2018  . MASTECTOMY Left  08/23/2015    RIGHT BREAST LUMPECTOMY WITH RADIOACTIVE SEED LOCALIZATION,  LEFT MASTECTOMY WITH AXILLARY LYMPH NODE DISSECTION  . MASTECTOMY WITH AXILLARY LYMPH NODE DISSECTION Left 08/23/2015   Procedure: LEFT MASTECTOMY WITH AXILLARY LYMPH NODE DISSECTION;  Surgeon: Alphonsa Overall, MD;  Location: [facility];  Service: General;  Laterality: Left;  [name] MULTIPLE TOOTH EXTRACTIONS       OB History   No obstetric history on file.      Home Medications    Prior to  Admission medications   Medication Sig Start Date End Date Taking? Authorizing Provider  albuterol (PROVENTIL HFA;VENTOLIN HFA) 108 (90 Base) MCG/ACT inhaler Inhale 2 puffs into the lungs every 6 (six) hours as needed for wheezing or shortness of breath.    Yes [provider]  albuterol (PROVENTIL) (2.5 MG/3ML) 0.083% nebulizer solution Take 3 mLs (2.5 mg total) by nebulization every 6 (six) hours as needed for up to 30 days for wheezing or shortness of breath. 07/02/18 08/01/18 Yes Alma Friendly, MD  ALPRAZolam Duanne Moron) 0.5 MG tablet Take 1 tablet (0.5 mg total) by mouth 3 (three) times daily as needed for anxiety or sleep. 05/15/18  Yes Regalado, Belkys A, MD  amLODipine (NORVASC) 5 MG tablet Take 1 tablet (5 mg total) by mouth daily. 05/16/18  Yes Regalado, Belkys A, MD  aspirin EC 81 MG tablet Take 81 mg by mouth daily.   Yes [provider]  dexamethasone (DECADRON) 1 MG tablet Take 1 tablet (1 mg total) by mouth daily. 06/17/18  Yes Nicholas Lose, MD  gabapentin (NEURONTIN) 300 MG capsule Take 1 capsule (300 mg total) by mouth 3 (three) times daily. 09/23/17  Yes Nicholas Lose, MD  metoprolol tartrate (LOPRESSOR) 25 MG tablet Take 1 tablet (25 mg total) by mouth 2 (two) times daily. 06/02/18  Yes Georgette Shell, MD  ondansetron (ZOFRAN) 8 MG tablet Take 1 tablet (8 mg total) by mouth every 8 (eight) hours as needed for nausea. 09/23/17  Yes Nicholas Lose, MD  oxyCODONE-acetaminophen (PERCOCET/ROXICET) 5-325 MG tablet Take 1 tablet by mouth every 8 (eight) hours as needed for moderate pain or severe pain (1 tab every 4-6 hour prn pain). Patient taking differently: Take 1 tablet by mouth every 6 (six) hours as needed for moderate pain or severe pain.  06/17/18  Yes Nicholas Lose, MD  PARoxetine (PAXIL) 20 MG tablet Take 1 tablet (20 mg total) by mouth 2 (two) times daily. 05/15/18  Yes Regalado, Belkys A, MD  PREMARIN vaginal cream INSERT 1 APPLICATORFUL VAGINALLY DAILY  Patient taking differently: Place 1 Applicatorful vaginally daily as needed (dryness).  04/19/17  Yes Nicholas Lose, MD  prochlorperazine (COMPAZINE) 10 MG tablet Take 1 tablet (10 mg total) by mouth every 6 (six) hours as needed (Nausea or vomiting). Patient taking differently: Take 10 mg by mouth every 6 (six) hours as needed for nausea or vomiting.  05/19/18  Yes Nicholas Lose, MD  lidocaine-prilocaine (EMLA) cream Apply to affected area once Patient taking differently: Apply 1 application topically daily as needed (port access).  05/19/18   Nicholas Lose, MD  zolpidem (AMBIEN CR) 6.25 MG CR tablet Take 1 tablet (6.25 mg total) by mouth at bedtime as needed for sleep. Patient not taking: Reported on 07/14/2018 06/24/18   Nicholas Lose, MD    Family History Family History  Problem Relation Age of Onset  . Breast cancer Maternal Aunt     Social History Social History   Tobacco Use  . Smoking status:  Current Every Day Smoker    Packs/day: 0.75    Years: 45.00    Pack years: 33.75    Types: Cigarettes  . Smokeless tobacco: Never Used  Substance Use Topics  . Alcohol use: Yes    Comment: ocassionally  . Drug use: No     Allergies   Fentanyl and Morphine and related   Review of Systems Review of Systems  Constitutional: Negative for chills and fever.  HENT: Negative for congestion and rhinorrhea.   Eyes: Negative for redness and visual disturbance.  Respiratory: Positive for shortness of breath. Negative for wheezing.   Cardiovascular: Positive for chest pain. Negative for palpitations.  Gastrointestinal: Negative for nausea and vomiting.  Genitourinary: Negative for dysuria and urgency.  Musculoskeletal: Negative for arthralgias and myalgias.  Skin: Negative for pallor and wound.  Neurological: Negative for dizziness and headaches.     Physical Exam Updated Vital Signs BP 133/85 (BP Location: Right Arm)   Pulse (!) 118   Temp 99.3 F (37.4 C) (Oral)   Resp 16   Ht  5\' 7"  (1.702 m)   Wt 72.1 kg   SpO2 97%   BMI 24.89 kg/m   Physical Exam Vitals signs and nursing note reviewed.  Constitutional:      General: She is not in acute distress.    Appearance: She is well-developed. She is not diaphoretic.  HENT:     Head: Normocephalic and atraumatic.  Eyes:     Pupils: Pupils are equal, round, and reactive to light.  Neck:     Musculoskeletal: Normal range of motion and neck supple.  Cardiovascular:     Rate and Rhythm: Normal rate and regular rhythm.     Heart sounds: No murmur. No friction rub. No gallop.   Pulmonary:     Effort: Pulmonary effort is normal.     Breath sounds: Examination of the left-upper field reveals decreased breath sounds. Examination of the left-middle field reveals decreased breath sounds. Examination of the left-lower field reveals decreased breath sounds. Decreased breath sounds present. No wheezing or rales.  Abdominal:     General: There is no distension.     Palpations: Abdomen is soft.     Tenderness: There is no abdominal tenderness.  Musculoskeletal:        General: No tenderness.  Skin:    General: Skin is warm and dry.  Neurological:     Mental Status: She is alert and oriented to person, place, and time.  Psychiatric:        Behavior: Behavior normal.      ED Treatments / Results  Labs (all labs ordered are listed, but only abnormal results are displayed) Labs Reviewed  CBC WITH DIFFERENTIAL/PLATELET - Abnormal; Notable for the following components:      Result Value   WBC 1.5 (*)    RBC 3.72 (*)    Hemoglobin 10.4 (*)    HCT 34.4 (*)    Neutro Abs 0.4 (*)    All other components within normal limits  COMPREHENSIVE METABOLIC PANEL - Abnormal; Notable for the following components:   Potassium 3.2 (*)    Calcium 8.5 (*)    Total Protein 6.1 (*)    Albumin 2.7 (*)    AST 12 (*)    All other components within normal limits  CULTURE, BLOOD (ROUTINE X 2)  SARS CORONAVIRUS 2 (HOSPITAL ORDER,  [facility])  CULTURE, BLOOD (ROUTINE X 2)  EXPECTORATED SPUTUM ASSESSMENT W REFEX  TO RESP CULTURE  CBC WITH DIFFERENTIAL/PLATELET  BASIC METABOLIC PANEL  MAGNESIUM  PHOSPHORUS  PROCALCITONIN  LACTIC ACID, PLASMA    EKG None  Radiology Dg Chest 2 View  Result Date: 07/14/2018 CLINICAL DATA:  63 year old with current history of stage IV LEFT lung cancer and chronic malignant LEFT pleural effusion with a PleurX catheter in place. Patient presents stating that the PleurX catheter is no longer draining and complains of acute shortness of breath. EXAM: CHEST - 2 VIEW COMPARISON:  CT chest 06/30/2018 and earlier. Chest x-ray 06/30/2018. FINDINGS: Moderate-sized LEFT pleural effusion, localized to the base and mid LEFT hemithorax. The PleurX catheter is positioned posteriorly and medially in the LEFT hemithorax. Associated consolidation in the LEFT LOWER LOBE and lingula. Small nodule involving the RIGHT mid lung as noted on the recent CT. RIGHT lung otherwise clear. No RIGHT pleural effusion. Cardiac silhouette normal in size, unchanged. Thoracic aorta mildly atherosclerotic, unchanged. RIGHT jugular Port-A-Cath tip in the LOWER SVC. Degenerative changes involving thoracic spine. IMPRESSION: 1. Moderate-sized LEFT pleural effusion localized to the base and mid LEFT hemithorax. Associated passive atelectasis and/or pneumonia involving the LEFT LOWER LOBE and lingula. 2. The PleurX catheter is positioned posteriorly and medially in the LEFT hemithorax. 3. Small nodule involving the RIGHT mid lung indicating metastasis, as noted on the recent CT. Electronically Signed   By: Evangeline Dakin M.D.   On: 07/14/2018 16:16    Procedures Procedures (including critical care time)  Medications Ordered in ED Medications  metroNIDAZOLE (FLAGYL) IVPB 500 mg (has no administration in time range)  amLODipine (NORVASC) tablet 5 mg (5 mg Oral Given 07/14/18 2117)  metoprolol tartrate  (LOPRESSOR) tablet 25 mg (25 mg Oral Given 07/14/18 2105)  PARoxetine (PAXIL) tablet 20 mg (20 mg Oral Given 07/14/18 2105)  gabapentin (NEURONTIN) capsule 300 mg (300 mg Oral Given 07/14/18 2105)  oxyCODONE-acetaminophen (PERCOCET/ROXICET) 5-325 MG per tablet 1 tablet (has no administration in time range)  senna-docusate (Senokot-S) tablet 2 tablet (2 tablets Oral Not Given 07/14/18 2105)  acetaminophen (TYLENOL) tablet 650 mg (has no administration in time range)  promethazine (PHENERGAN) injection 12.5 mg (has no administration in time range)  chlorpheniramine-HYDROcodone (TUSSIONEX) 10-8 MG/5ML suspension 5 mL (5 mLs Oral Given 07/14/18 2105)  benzonatate (TESSALON) capsule 200 mg (200 mg Oral Not Given 07/14/18 2105)  enoxaparin (LOVENOX) injection 40 mg (40 mg Subcutaneous Given 07/14/18 2105)  ALPRAZolam (XANAX) tablet 0.5 mg (has no administration in time range)  aspirin EC tablet 81 mg (has no administration in time range)  lidocaine-prilocaine (EMLA) cream 1 application (has no administration in time range)  nicotine (NICODERM CQ - dosed in mg/24 hours) patch 21 mg (21 mg Transdermal Not Given 07/14/18 2117)  feeding supplement (ENSURE ENLIVE) (ENSURE ENLIVE) liquid 237 mL (has no administration in time range)  HYDROmorphone (DILAUDID) injection 0.5 mg (0.5 mg Intravenous Given 07/14/18 2106)  ceFEPIme (MAXIPIME) 2 g in sodium chloride 0.9 % 100 mL IVPB (has no administration in time range)  ipratropium (ATROVENT) nebulizer solution 0.5 mg (has no administration in time range)  levalbuterol (XOPENEX) nebulizer solution 0.63 mg (has no administration in time range)  levalbuterol (XOPENEX) nebulizer solution 0.63 mg (has no administration in time range)  sodium chloride 0.9 % bolus 1,000 mL (0 mLs Intravenous Stopped 07/14/18 1742)  HYDROmorphone (DILAUDID) injection 0.5 mg (0.5 mg Intravenous Given 07/14/18 1556)  ondansetron (ZOFRAN) injection 4 mg (4 mg Intravenous Given 07/14/18 1556)  ceFEPIme (MAXIPIME)  2 g in sodium chloride 0.9 %  100 mL IVPB (0 g Intravenous Stopped 07/14/18 1900)  potassium chloride SA (K-DUR) CR tablet 40 mEq (40 mEq Oral Given 07/14/18 2105)  magnesium sulfate IVPB 2 g 50 mL (2 g Intravenous New Bag/Given 07/14/18 2113)     Initial Impression / Assessment and Plan / ED Course  I have reviewed the triage vital signs and the nursing notes.  Pertinent labs & imaging results that were available during my care of the patient were reviewed by me and considered in my medical decision making (see chart for details).        63 yo F with a chief complaints of chest pain.  Pleuritic in nature.  Similar to her prior breast cancer pain.  Patient has had issues with her Pleurx catheter.  Was seen about 7 days ago and had it drained by an IR physician.  She has been having trouble getting anything to come out recently.  Denying any infectious symptoms.  She is tachycardic into the 120s.  Decreased oral intake at home.  Will check lab work give a bolus of IV fluids give pain and nausea medicine.  Patient is feeling mildly better on reassessment her tachycardia has improved though not resolved.  Chest x-ray does show a Pleurx catheter in good position.  She does have a moderate sized pleural effusion and there is also some concern for pneumonia.  The patient has neutropenia as well with neutrophils of 0.4.  She is not significantly anemic and her platelets are normal.  As the patient has cough and chest pain and some concern for pneumonia on chest x-ray with neutropenia will start on broad-spectrum antibiotics and discuss with the hospitalist.  The patients results and plan were reviewed and discussed.   Any x-rays performed were independently reviewed by myself.   Differential diagnosis were considered with the presenting HPI.  Medications  metroNIDAZOLE (FLAGYL) IVPB 500 mg (has no administration in time range)  amLODipine (NORVASC) tablet 5 mg (5 mg Oral Given 07/14/18 2117)  metoprolol  tartrate (LOPRESSOR) tablet 25 mg (25 mg Oral Given 07/14/18 2105)  PARoxetine (PAXIL) tablet 20 mg (20 mg Oral Given 07/14/18 2105)  gabapentin (NEURONTIN) capsule 300 mg (300 mg Oral Given 07/14/18 2105)  oxyCODONE-acetaminophen (PERCOCET/ROXICET) 5-325 MG per tablet 1 tablet (has no administration in time range)  senna-docusate (Senokot-S) tablet 2 tablet (2 tablets Oral Not Given 07/14/18 2105)  acetaminophen (TYLENOL) tablet 650 mg (has no administration in time range)  promethazine (PHENERGAN) injection 12.5 mg (has no administration in time range)  chlorpheniramine-HYDROcodone (TUSSIONEX) 10-8 MG/5ML suspension 5 mL (5 mLs Oral Given 07/14/18 2105)  benzonatate (TESSALON) capsule 200 mg (200 mg Oral Not Given 07/14/18 2105)  enoxaparin (LOVENOX) injection 40 mg (40 mg Subcutaneous Given 07/14/18 2105)  ALPRAZolam (XANAX) tablet 0.5 mg (has no administration in time range)  aspirin EC tablet 81 mg (has no administration in time range)  lidocaine-prilocaine (EMLA) cream 1 application (has no administration in time range)  nicotine (NICODERM CQ - dosed in mg/24 hours) patch 21 mg (21 mg Transdermal Not Given 07/14/18 2117)  feeding supplement (ENSURE ENLIVE) (ENSURE ENLIVE) liquid 237 mL (has no administration in time range)  HYDROmorphone (DILAUDID) injection 0.5 mg (0.5 mg Intravenous Given 07/14/18 2106)  ceFEPIme (MAXIPIME) 2 g in sodium chloride 0.9 % 100 mL IVPB (has no administration in time range)  ipratropium (ATROVENT) nebulizer solution 0.5 mg (has no administration in time range)  levalbuterol (XOPENEX) nebulizer solution 0.63 mg (has no administration in time range)  levalbuterol (XOPENEX) nebulizer solution 0.63 mg (has no administration in time range)  sodium chloride 0.9 % bolus 1,000 mL (0 mLs Intravenous Stopped 07/14/18 1742)  HYDROmorphone (DILAUDID) injection 0.5 mg (0.5 mg Intravenous Given 07/14/18 1556)  ondansetron (ZOFRAN) injection 4 mg (4 mg Intravenous Given 07/14/18 1556)  ceFEPIme  (MAXIPIME) 2 g in sodium chloride 0.9 % 100 mL IVPB (0 g Intravenous Stopped 07/14/18 1900)  potassium chloride SA (K-DUR) CR tablet 40 mEq (40 mEq Oral Given 07/14/18 2105)  magnesium sulfate IVPB 2 g 50 mL (2 g Intravenous New Bag/Given 07/14/18 2113)    Vitals:   07/14/18 1845 07/14/18 1900 07/14/18 2011 07/14/18 2122  BP: 121/79 (!) 140/93 133/85   Pulse: (!) 110 (!) 109 (!) 118   Resp:  18 16   Temp:   99.3 F (37.4 C)   TempSrc:   Oral   SpO2: 94% 95% 97% 97%  Weight:   72.1 kg   Height:   5\' 7"  (1.702 m)     Final diagnoses:  Chemotherapy-induced neutropenia (HCC)  HCAP (healthcare-associated pneumonia)    Admission/ observation were discussed with the admitting physician, patient and/or family and they are comfortable with the plan.    Final Clinical Impressions(s) / ED Diagnoses   Final diagnoses:  Chemotherapy-induced neutropenia ([place])  HCAP (healthcare-associated pneumonia)    ED Discharge Orders    None       Deno Etienne, DO 07/14/18 2236

## 2018-07-14 NOTE — Progress Notes (Signed)
PHARMACY NOTE:  ANTIMICROBIAL RENAL DOSAGE ADJUSTMENT  Current antimicrobial regimen includes a mismatch between antimicrobial dosage and estimated renal function.  As per policy approved by the Pharmacy & Therapeutics and Medical Executive Committees, the antimicrobial dosage will be adjusted accordingly.  Current antimicrobial dosage:  Cefepime 2gm q24h  Indication: PNA  Renal Function:  Estimated Creatinine Clearance: 70 mL/min (by C-G formula based on SCr of 0.7 mg/dL). []      On intermittent HD, scheduled: []      On CRRT    Antimicrobial dosage has been changed to:  Cefepime 2gm IV q8h    Thank you for allowing pharmacy to be a part of this patient's care.  Lynelle Doctor, [facility] 07/14/2018 8:51 PM

## 2018-07-14 NOTE — H&P (Addendum)
History and Physical  Deborah Mcintyre:[id number] DOB: April 29, 1955 DOA: 07/14/2018  Referring physician: Dr. Tyrone Nine PCP: [name], MD  Outpatient Specialists: Dr. Marlowe Sax, heme oncology. Patient coming from: Home  Chief Complaint: Shortness of breath, worsening cough and pleuritic left chest pain  HPI: Deborah Mcintyre is a 63 y.o. female with medical history significant for left breast cancer status post mastectomy with ongoing chemotherapy, chronic anxiety and depression, hypertension, recurrent malignant left pleural effusion status post Pleurx catheter placement in May 2020 by interventional radiology, COPD on 2 L of oxygen via nasal cannula as needed, ongoing tobacco use who presented to [facility] ED with complaints of worsening shortness of breath, cough and pleuritic left chest pain.  Patient reports in the last 2 days her left chest pain has become increasingly worse to the point where she finds it difficult to take a breath.  States her pleural catheter has not been draining well.  Her pain is hardly improved with her home pain medications.  Associated symptoms include nausea with no vomiting, chills and night sweats.  Due to worsening pain she decided to come to the ED for further evaluation.  Her last chemotherapy was 1 week ago.  ED Course: Upon presentation to the ED chest x-ray revealed recurrent left pleural effusion with possible left lower lobe infiltrates, lab studies remarkable for significant leukopenia with WBC 1.5 and neutropenia with neutrophil 0.4.  She was started on IV antibiotics empirically due to suspicion for HCAP.  Review of Systems: Review of systems as noted in the HPI. All other systems reviewed and are negative.   Past Medical History:  Diagnosis Date  . Anxiety   . Arthritis   . Bronchitis   . COPD (chronic obstructive pulmonary disease) ([place])   . Depression   . Hypertension   . Malignant neoplasm of upper-outer quadrant of left female breast ([place])  01/04/2015  . Nocturia   . Numbness and tingling    toes - bilateral  . Pneumonia   . PONV (postoperative nausea and vomiting)    Nausea   Past Surgical History:  Procedure Laterality Date  . BREAST LUMPECTOMY Bilateral    x3  . BREAST LUMPECTOMY WITH RADIOACTIVE SEED LOCALIZATION Right 08/23/2015   Procedure: RIGHT BREAST LUMPECTOMY WITH RADIOACTIVE SEED LOCALIZATION;  Surgeon: Alphonsa Overall, MD;  Location: [facility];  Service: General;  Laterality: Right;  . CESAREAN SECTION     x4  . IR GUIDED DRAIN W CATHETER PLACEMENT  06/02/2018  . IR IMAGING GUIDED PORT INSERTION  05/25/2018  . IR REMOVAL TUN ACCESS W/ PORT W/O FL MOD SED  10/20/2016  . IR THORACENTESIS ASP PLEURAL SPACE W/IMG GUIDE  05/12/2018  . IR THORACENTESIS ASP PLEURAL SPACE W/IMG GUIDE  07/07/2018  . MASTECTOMY Left 08/23/2015    RIGHT BREAST LUMPECTOMY WITH RADIOACTIVE SEED LOCALIZATION,  LEFT MASTECTOMY WITH AXILLARY LYMPH NODE DISSECTION  . MASTECTOMY WITH AXILLARY LYMPH NODE DISSECTION Left 08/23/2015   Procedure: LEFT MASTECTOMY WITH AXILLARY LYMPH NODE DISSECTION;  Surgeon: Alphonsa Overall, MD;  Location: [facility];  Service: General;  Laterality: Left;  [name] MULTIPLE TOOTH EXTRACTIONS      Social History:  reports that she has been smoking cigarettes. She has a 33.75 pack-year smoking history. She has never used smokeless tobacco. She reports current alcohol use. She reports that she does not use drugs.   Allergies  Allergen Reactions  . Fentanyl Nausea And Vomiting and Other (See Comments)    Headache  . Morphine And Related  Nausea And Vomiting and Other (See Comments)    Headache    Family History  Problem Relation Age of Onset  . Breast cancer Maternal Aunt      Prior to Admission medications   Medication Sig Start Date End Date Taking? Authorizing Provider  albuterol (PROVENTIL HFA;VENTOLIN HFA) 108 (90 Base) MCG/ACT inhaler Inhale 2 puffs into the lungs every 6 (six) hours as needed for wheezing or shortness of  breath.     [provider]  albuterol (PROVENTIL) (2.5 MG/3ML) 0.083% nebulizer solution Take 3 mLs (2.5 mg total) by nebulization every 6 (six) hours as needed for up to 30 days for wheezing or shortness of breath. 07/02/18 08/01/18  Alma Friendly, MD  ALPRAZolam Duanne Moron) 0.5 MG tablet Take 1 tablet (0.5 mg total) by mouth 3 (three) times daily as needed for anxiety or sleep. 05/15/18   Regalado, Belkys A, MD  amLODipine (NORVASC) 5 MG tablet Take 1 tablet (5 mg total) by mouth daily. 05/16/18   Regalado, Belkys A, MD  aspirin EC 81 MG tablet Take 81 mg by mouth daily.    [provider]  dexamethasone (DECADRON) 1 MG tablet Take 1 tablet (1 mg total) by mouth daily. 06/17/18   Nicholas Lose, MD  gabapentin (NEURONTIN) 300 MG capsule Take 1 capsule (300 mg total) by mouth 3 (three) times daily. 09/23/17   Nicholas Lose, MD  lidocaine-prilocaine (EMLA) cream Apply to affected area once Patient taking differently: Apply 1 application topically daily as needed (port access).  05/19/18   Nicholas Lose, MD  metoprolol tartrate (LOPRESSOR) 25 MG tablet Take 1 tablet (25 mg total) by mouth 2 (two) times daily. 06/02/18   Georgette Shell, MD  ondansetron (ZOFRAN) 8 MG tablet Take 1 tablet (8 mg total) by mouth every 8 (eight) hours as needed for nausea. 09/23/17   Nicholas Lose, MD  oxyCODONE-acetaminophen (PERCOCET/ROXICET) 5-325 MG tablet Take 1 tablet by mouth every 8 (eight) hours as needed for moderate pain or severe pain (1 tab every 4-6 hour prn pain). Patient taking differently: Take 1 tablet by mouth every 6 (six) hours as needed for moderate pain or severe pain.  06/17/18   Nicholas Lose, MD  PARoxetine (PAXIL) 20 MG tablet Take 1 tablet (20 mg total) by mouth 2 (two) times daily. 05/15/18   Regalado, Belkys A, MD  PREMARIN vaginal cream INSERT 1 APPLICATORFUL VAGINALLY DAILY Patient taking differently: Place 1 Applicatorful vaginally daily as needed (dryness).  04/19/17   Nicholas Lose, MD  prochlorperazine (COMPAZINE) 10 MG tablet Take 1 tablet (10 mg total) by mouth every 6 (six) hours as needed (Nausea or vomiting). Patient taking differently: Take 10 mg by mouth every 6 (six) hours as needed for nausea or vomiting.  05/19/18   Nicholas Lose, MD  zolpidem (AMBIEN CR) 6.25 MG CR tablet Take 1 tablet (6.25 mg total) by mouth at bedtime as needed for sleep. 06/24/18   Nicholas Lose, MD    Physical Exam: BP 130/78   Pulse (!) 109   Temp 98 F (36.7 C) (Oral)   Resp 18   SpO2 94%   . General: 63 y.o. year-old female well developed well nourished in no acute distress.  Alert and oriented x3. . Cardiovascular: Regular rate and rhythm with no rubs or gallops.  No thyromegaly or JVD noted.  No lower extremity edema. 2/4 pulses in all 4 extremities. [name] Respiratory: Mild rales at bases with no wheezes noted.  Poor inspiratory effort.   [name]  Abdomen: Soft nontender nondistended with normal bowel sounds x4 quadrants. . Muskuloskeletal: No cyanosis, clubbing or edema noted bilaterally . Neuro: CN II-XII intact, strength, sensation, reflexes . Skin: No ulcerative lesions noted or rashes. . Psychiatry: Judgement and insight appear normal. Mood is appropriate for condition and setting          Labs on Admission:  Basic Metabolic Panel: Recent Labs  Lab 07/14/18 1606  NA 140  K 3.2*  CL 98  CO2 28  GLUCOSE 89  BUN 9  CREATININE 0.70  CALCIUM 8.5*   Liver Function Tests: Recent Labs  Lab 07/14/18 1606  AST 12*  ALT 9  ALKPHOS 49  BILITOT 0.6  PROT 6.1*  ALBUMIN 2.7*   No results for input(s): LIPASE, AMYLASE in the last 168 hours. No results for input(s): AMMONIA in the last 168 hours. CBC: Recent Labs  Lab 07/14/18 1606  WBC 1.5*  NEUTROABS 0.4*  HGB 10.4*  HCT 34.4*  MCV 92.5  PLT 369   Cardiac Enzymes: No results for input(s): CKTOTAL, CKMB, CKMBINDEX, TROPONINI in the last 168 hours.  BNP (last 3 results) No results for input(s): BNP in the  last 8760 hours.  ProBNP (last 3 results) No results for input(s): PROBNP in the last 8760 hours.  CBG: No results for input(s): GLUCAP in the last 168 hours.  Radiological Exams on Admission: Dg Chest 2 View  Result Date: 07/14/2018 CLINICAL DATA:  63 year old with current history of stage IV LEFT lung cancer and chronic malignant LEFT pleural effusion with a PleurX catheter in place. Patient presents stating that the PleurX catheter is no longer draining and complains of acute shortness of breath. EXAM: CHEST - 2 VIEW COMPARISON:  CT chest 06/30/2018 and earlier. Chest x-ray 06/30/2018. FINDINGS: Moderate-sized LEFT pleural effusion, localized to the base and mid LEFT hemithorax. The PleurX catheter is positioned posteriorly and medially in the LEFT hemithorax. Associated consolidation in the LEFT LOWER LOBE and lingula. Small nodule involving the RIGHT mid lung as noted on the recent CT. RIGHT lung otherwise clear. No RIGHT pleural effusion. Cardiac silhouette normal in size, unchanged. Thoracic aorta mildly atherosclerotic, unchanged. RIGHT jugular Port-A-Cath tip in the LOWER SVC. Degenerative changes involving thoracic spine. IMPRESSION: 1. Moderate-sized LEFT pleural effusion localized to the base and mid LEFT hemithorax. Associated passive atelectasis and/or pneumonia involving the LEFT LOWER LOBE and lingula. 2. The PleurX catheter is positioned posteriorly and medially in the LEFT hemithorax. 3. Small nodule involving the RIGHT mid lung indicating metastasis, as noted on the recent CT. Electronically Signed   By: Evangeline Dakin M.D.   On: 07/14/2018 16:16    EKG: I independently viewed the EKG done and my findings are as followed: Sinus rhythm.  No specific ST-T changes.  QTC 43.  Assessment/Plan Present on Admission: . HCAP (healthcare-associated pneumonia)  Active Problems:   HCAP (healthcare-associated pneumonia)  HCAP, POA In the setting of immunosuppression with last  chemotherapy a week ago Independently reviewed chest x-ray done on admission which shows hyperinflated lungs with left pleural effusion, possible infiltrates and right middle lobe infiltrate versus atelectasis Continue empiric antibiotics cefepime and IV Flagyl Obtain sputum culture and blood cultures peripherally x2 Obtain procalcitonin and lactic acid in the morning CBC with differentials in the morning Monitor fever curve and WBC  Recurrent malignant left pleural effusion IR consulted to assess Pleurx catheter and for possible drainage Rest of management as per above Optimize pain control  Leukopenia/neutropenia Afebrile Neutropenic precautions Consult hematology/oncology in  the morning Patient follows with Dr. 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 with axillary lymph node dissection, ongoing chemotherapy Consult hematology/oncology in the morning IV Phenergan as needed for nausea Tylenol as needed for fever IV morphine as needed for severe pain Percocet as needed for moderate pain  COPD on 2 L of oxygen by nasal cannula as needed Maintain O2 saturation greater than 92% Home O2 evaluation prior to discharge Start inhalers Xopenex and ipratropium Start Tussionex for cough and pleuritic pain  Sinus tachycardia likely driven by lung physiology IR consulted for possible left pleural effusion drainage She has Pleurx in place, reports malfunctioning Resume beta-blockers Hold off albuterol Close monitoring on telemetry  Uncontrolled hypertension Resume home antihypertensive medications Continue to closely monitor vital signs  Moderate protein calorie malnutrition Albumin 2.7 Reports significant weight loss and loss of appetite Denies odynophagia or dysphagia Start oral supplements Patient requests clear liquid diet Advance diet as tolerated  Hypo-kalemia  Potassium 3.2 Repleted with KCl p.o.  Complemented by magnesium IV 2 g once Obtain magnesium level in the morning Repeat BMP in the morning  Chronic anxiety/depression Resume home medications  Tobacco use disorder, ongoing Tobacco cessation counseling done at bedside.  Patient states she has cut down significantly on tobacco use.   Risks: High risk for decompensation due to suspected HCAP, recurrent malignant left pleural effusion, lymphocytopenia, neutropenia in the setting of immunosuppression, multiple comorbidities and advanced age.  Patient will require at least 2 midnights for further evaluation and treatment of present condition.    DVT prophylaxis: Subcu Lovenox daily  Code Status: Full code  Family Communication: Call family if okay with the patient.  Disposition Plan: Admit to telemetry unit  Consults called: Please consult heme oncology in the morning.  IR has been consulted to assess Pleurx and for possible drainage.  Admission status: Inpatient status    Kayleen Memos MD [facility] Pager [phone]  If 7PM-7AM, please contact night-coverage [email or web address] Password [email or web address]  07/14/2018, 6:50 PM

## 2018-07-15 LAB — BASIC METABOLIC PANEL
Anion gap: 11 (ref 5–15)
BUN: 8 mg/dL (ref 8–23)
CO2: 27 mmol/L (ref 22–32)
Calcium: 8.2 mg/dL — ABNORMAL LOW (ref 8.9–10.3)
Chloride: 103 mmol/L (ref 98–111)
Creatinine, Ser: 0.66 mg/dL (ref 0.44–1.00)
GFR calc Af Amer: >60 mL/min (ref >60)
GFR calc non Af Amer: >60 mL/min (ref >60)
Glucose, Bld: 97 mg/dL (ref 70–99)
Potassium: 3.7 mmol/L (ref 3.5–5.1)
Sodium: 141 mmol/L (ref 135–145)

## 2018-07-15 LAB — MAGNESIUM: Magnesium: 2.2 mg/dL (ref 1.7–2.4)

## 2018-07-15 LAB — CBC WITH DIFFERENTIAL/PLATELET
Abs Immature Granulocytes: 0.03 10*3/uL (ref 0.00–0.07)
Basophils Absolute: 0 10*3/uL (ref 0.0–0.1)
Basophils Relative: 1 %
Eosinophils Absolute: 0 10*3/uL (ref 0.0–0.5)
Eosinophils Relative: 0 %
HCT: 30.6 % — ABNORMAL LOW (ref 36.0–46.0)
Hemoglobin: 8.9 g/dL — ABNORMAL LOW (ref 12.0–15.0)
Immature Granulocytes: 2 %
Lymphocytes Relative: 36 %
Lymphs Abs: 0.5 10*3/uL — ABNORMAL LOW (ref 0.7–4.0)
MCH: 27.2 pg (ref 26.0–34.0)
MCHC: 29.1 g/dL — ABNORMAL LOW (ref 30.0–36.0)
MCV: 93.6 fL (ref 80.0–100.0)
Monocytes Absolute: 0.6 10*3/uL (ref 0.1–1.0)
Monocytes Relative: 42 %
Neutro Abs: 0.3 10*3/uL — ABNORMAL LOW (ref 1.7–7.7)
Neutrophils Relative %: 19 %
Platelets: 317 10*3/uL (ref 150–400)
RBC: 3.27 MIL/uL — ABNORMAL LOW (ref 3.87–5.11)
RDW: 13.8 % (ref 11.5–15.5)
WBC: 1.4 10*3/uL — CL (ref 4.0–10.5)
nRBC: 0 % (ref 0.0–0.2)

## 2018-07-15 LAB — PROCALCITONIN: Procalcitonin: <0.1 ng/mL

## 2018-07-15 LAB — LACTIC ACID, PLASMA: Lactic Acid, Venous: 0.9 mmol/L (ref 0.5–1.9)

## 2018-07-15 LAB — PHOSPHORUS: Phosphorus: 2.6 mg/dL (ref 2.5–4.6)

## 2018-07-15 MED ORDER — TBO-FILGRASTIM 480 MCG/0.8ML ~~LOC~~ SOSY
480.0000 ug | PREFILLED_SYRINGE | Freq: Every day | SUBCUTANEOUS | Status: AC
  Administered 2018-07-15 – 2018-07-17 (×3): 480 ug via SUBCUTANEOUS
  Filled 2018-07-15 (×3): qty 0.8

## 2018-07-15 MED ORDER — HYDROMORPHONE HCL 1 MG/ML IJ SOLN
0.5000 mg | INTRAMUSCULAR | Status: AC | PRN
  Administered 2018-07-15 – 2018-07-16 (×4): 0.5 mg via INTRAVENOUS
  Filled 2018-07-15 (×4): qty 0.5

## 2018-07-15 MED ORDER — PRO-STAT SUGAR FREE PO LIQD
30.0000 mL | Freq: Two times a day (BID) | ORAL | Status: DC
  Administered 2018-07-15 – 2018-07-18 (×7): 30 mL via ORAL
  Filled 2018-07-15 (×7): qty 30

## 2018-07-15 MED ORDER — BOOST / RESOURCE BREEZE PO LIQD CUSTOM
1.0000 | Freq: Three times a day (TID) | ORAL | Status: DC
  Administered 2018-07-15 – 2018-07-16 (×3): 1 via ORAL

## 2018-07-15 MED ORDER — SODIUM CHLORIDE 0.9% FLUSH: 10.0000 mL | INTRAVENOUS | Status: DC | PRN

## 2018-07-15 MED ORDER — ADULT MULTIVITAMIN W/MINERALS CH
1.0000 | ORAL_TABLET | Freq: Every day | ORAL | Status: DC
  Administered 2018-07-15 – 2018-07-18 (×4): 1 via ORAL
  Filled 2018-07-15 (×4): qty 1

## 2018-07-15 NOTE — Progress Notes (Signed)
PT Cancellation Note / Screen  Patient Details Name: Deborah Mcintyre MRN: [id number] DOB: Dec 04, 1955   Cancelled Treatment:    Reason Eval/Treat Not Completed: PT screened, no needs identified, will sign off Pt reports she has been ambulating to/from bathroom today.  Pt declines to participate due to change in pain meds and she reports she is currently in pain.  RN aware and also reports pt is mobilizing well and observed no acute PT needs.  PT to sign off.   Elveta Rape,KATHrine E 07/15/2018, 2:26 PM Carmelia Bake, PT, DPT Acute Rehabilitation Services Office: [phone] Pager: [phone]

## 2018-07-15 NOTE — Progress Notes (Signed)
Attempted to call and update husband, Jeneen Rinks, twice with no answer.

## 2018-07-15 NOTE — Progress Notes (Signed)
CRITICAL VALUE STICKER  CRITICAL VALUE: WBC 1.4  DATE & TIME NOTIFIED: 07/15/2018  0543  MESSENGER (representative from lab): Elmyra Ricks  TIME OF NOTIFICATION: MD was in room when lab called and was aware  RESPONSE: No new orders placed

## 2018-07-15 NOTE — Progress Notes (Addendum)
PROGRESS NOTE  Deborah Mcintyre IRW:[id number] DOB: [birth year] DOA: 07/14/2018 PCP: [name], MD  HPI/Recap of past 24 hours: Deborah Mcintyre is a 63 y.o. female with medical history significant for left breast cancer status post mastectomy with ongoing chemotherapy, chronic anxiety and depression, hypertension, recurrent malignant left pleural effusion status post Pleurx catheter placement in May 2020 by interventional radiology, COPD on 2 L of oxygen via nasal cannula as needed, ongoing tobacco use who presented to [facility] ED with complaints of worsening shortness of breath, cough and pleuritic left chest pain.  Patient reports in the last 2 days her left chest pain has become increasingly worse to the point where she finds it difficult to take a breath.  States her pleural catheter has not been draining well.  Her pain is hardly improved with her home pain medications.  Associated symptoms include nausea with no vomiting, chills and night sweats.  Due to worsening pain she decided to come to the ED for further evaluation.  Her last chemotherapy was 1 week ago.  ED Course: Upon presentation to the ED chest x-ray revealed recurrent left pleural effusion with possible left lower lobe infiltrates, lab studies remarkable for significant leukopenia with WBC 1.5 and neutropenia with neutrophil 0.4.  She was started on IV antibiotics empirically due to suspicion for HCAP.  07/15/18: Patient seen and examined at her bedside.  Reports left-sided lateral chest pain improved with IV Dilaudid.  O2 saturation 97% on room air.  IR has been consulted for possible assessment of Pleurx and drainage of left pleural effusion.  Assessment/Plan: Active Problems:   HCAP (healthcare-associated pneumonia)   HCAP, POA In the setting of immunosuppression with last chemotherapy a week ago Independently reviewed chest x-ray done on admission which shows hyperinflated lungs with left pleural effusion, possible  infiltrates and right middle lobe infiltrate versus atelectasis Continue cefepime Cultures are pending  Recurrent malignant left pleural effusion IR consulted o assess Pleurx catheter and for possible drainage Rest of management as per above Optimize pain control  Worsening leukopenia/neutropenia Afebrile Continue neutropenic precautions Patient follows with Dr. 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 with axillary lymph node dissection, ongoing chemotherapy Consult hematology/oncology in the morning Continue IV Phenergan as needed for nausea Tylenol as needed for fever States she is allergic to morphine, has no issues with Percocet Patient Requests Dilaudid IV Continue Dilaudid as needed for severe pain Percocet as needed for moderate pain  COPD on 2 L of oxygen by nasal cannula as needed Maintain O2 saturation greater than 92% Home O2 evaluation prior to discharge Continue inhalers Xopenex and ipratropium Continue Tussionex for cough and pleuritic pain  Resolved sinus tachycardia likely driven by lung physiology IR consulted for possible left pleural effusion drainage She has Pleurx in place, reports malfunctioning Resume beta-blockers Continue to hold of albuterol due to recent tachycardia Continue to closely monitor on telemetry  Resolved uncontrolled hypertension Blood pressure is normotensive Resume home antihypertensive medications Continue to closely monitor vital signs  Moderate protein calorie malnutrition Albumin 2.7 Reports significant weight loss and loss of appetite Denies odynophagia or dysphagia Start oral supplements Patient requests clear liquid diet Advance diet as tolerated  Resolved hypo-kalemia post repletion  Chronic anxiety/depression Resume home medications  Tobacco use disorder, ongoing Tobacco  cessation counseling done at bedside.  Patient states she has cut down significantly on tobacco use.   Risks: High risk for  decompensation due to suspected HCAP, recurrent malignant left pleural effusion, leukopenia, neutropenia, immunosuppressive state, multiple comorbidities and advanced age.  Patient will require at least 2 midnights for further evaluation and treatment of present condition.   DVT prophylaxis: Subcu Lovenox daily  Code Status: Full code  Family Communication: Call family if okay with the patient.  Disposition Plan:  Possible discharge to home in 1 to 2 days when hematology signs off.  Consults called: Heme oncology, Dr Franklyn Lor.  IR has been consulted to assess Pleurx and for possible drainage.      Objective: Vitals:   07/14/18 2011 07/14/18 2122 07/15/18 0544 07/15/18 0720  BP: 133/85  115/71   Pulse: (!) 118  99   Resp: 16  20   Temp: 99.3 F (37.4 C)  98.3 F (36.8 C)   TempSrc: Oral  Oral   SpO2: 97% 97% 92% 92%  Weight: 72.1 kg     Height: 5\' 7"  (1.702 m)       Intake/Output Summary (Last 24 hours) at 07/15/2018 1059 Last data filed at 07/15/2018 1000 Gross per 24 hour  Intake 1520.9 ml  Output --  Net 1520.9 ml   Filed Weights   07/14/18 2011  Weight: 72.1 kg    Exam:   General: 63 y.o. year-old female well developed well nourished in no acute distress.  Alert and oriented x3.  Cardiovascular: Regular rate and rhythm with no rubs or gallops.  No thyromegaly or JVD noted.    Respiratory: Mild rales at bases with no wheezes noted.  Poor inspiratory effort..  Abdomen: Soft nontender nondistended with normal bowel sounds x4 quadrants.  Left pleural catheter in left lateral chest.  Musculoskeletal: No lower extremity edema. 2/4 pulses in all 4 extremities.  Psychiatry: Mood is irritable.   Data Reviewed: CBC: Recent Labs  Lab 07/14/18 1606 07/15/18 0500  WBC 1.5* 1.4*  NEUTROABS 0.4* 0.3*  HGB 10.4* 8.9*  HCT 34.4*  30.6*  MCV 92.5 93.6  PLT 369 751   Basic Metabolic Panel: Recent Labs  Lab 07/14/18 1606 07/15/18 0500  NA 140 141  K 3.2* 3.7  CL 98 103  CO2 28 27  GLUCOSE 89 97  BUN 9 8  CREATININE 0.70 0.66  CALCIUM 8.5* 8.2*  MG  --  2.2  PHOS  --  2.6   GFR: Estimated Creatinine Clearance: 70 mL/min (by C-G formula based on SCr of 0.66 mg/dL). Liver Function Tests: Recent Labs  Lab 07/14/18 1606  AST 12*  ALT 9  ALKPHOS 49  BILITOT 0.6  PROT 6.1*  ALBUMIN 2.7*   No results for input(s): LIPASE, AMYLASE in the last 168 hours. No results for input(s): AMMONIA in the last 168 hours. Coagulation Profile: No results for input(s): INR, PROTIME in the last 168 hours. Cardiac Enzymes: No results for input(s): CKTOTAL, CKMB, CKMBINDEX, TROPONINI in the last 168 hours. BNP (last 3 results) No results for input(s): PROBNP in the last 8760 hours. HbA1C: No results for input(s): HGBA1C in the last 72 hours. CBG: No results for input(s): GLUCAP in the last 168 hours. Lipid Profile: No results for input(s): CHOL, HDL, LDLCALC, TRIG, CHOLHDL, LDLDIRECT in the last 72 hours. Thyroid Function Tests: No results for input(s): TSH, T4TOTAL, FREET4, T3FREE, THYROIDAB in the last 72 hours. Anemia Panel: No results for input(s): VITAMINB12, FOLATE, FERRITIN, TIBC, IRON, RETICCTPCT in the last 72 hours. Urine analysis:    Component Value Date/Time   COLORURINE YELLOW 12/06/2014 2207   APPEARANCEUR  CLOUDY (A) 12/06/2014 2207   LABSPEC 1.025 12/06/2014 2207   PHURINE 5.5 12/06/2014 2207   GLUCOSEU NEGATIVE 12/06/2014 2207   HGBUR MODERATE (A) 12/06/2014 2207   BILIRUBINUR NEGATIVE 12/06/2014 2207   KETONESUR NEGATIVE 12/06/2014 2207   PROTEINUR NEGATIVE 12/06/2014 2207   UROBILINOGEN 1.0 07/30/2013 1527   NITRITE POSITIVE (A) 12/06/2014 2207   LEUKOCYTESUR SMALL (A) 12/06/2014 2207   Sepsis Labs: @LABRCNTIP (procalcitonin:4,lacticidven:4)  ) Recent Results (from the past 240  hour(s))  SARS Coronavirus 2 (CEPHEID - Performed in [facility] hospital lab), Hosp Order     Status: None   Collection Time: 07/07/18  1:40 PM   Specimen: Nasopharyngeal Swab  Result Value Ref Range Status   SARS Coronavirus 2 NEGATIVE NEGATIVE Final    Comment: (NOTE) If result is NEGATIVE SARS-CoV-2 target nucleic acids are NOT DETECTED. The SARS-CoV-2 RNA is generally detectable in upper and lower  respiratory specimens during the acute phase of infection. The lowest  concentration of SARS-CoV-2 viral copies this assay can detect is 250  copies / mL. A negative result does not preclude SARS-CoV-2 infection  and should not be used as the sole basis for treatment or other  patient management decisions.  A negative result may occur with  improper specimen collection / handling, submission of specimen other  than nasopharyngeal swab, presence of viral mutation(s) within the  areas targeted by this assay, and inadequate number of viral copies  (<250 copies / mL). A negative result must be combined with clinical  observations, patient history, and epidemiological information. If result is POSITIVE SARS-CoV-2 target nucleic acids are DETECTED. The SARS-CoV-2 RNA is generally detectable in upper and lower  respiratory specimens dur ing the acute phase of infection.  Positive  results are indicative of active infection with SARS-CoV-2.  Clinical  correlation with patient history and other diagnostic information is  necessary to determine patient infection status.  Positive results do  not rule out bacterial infection or co-infection with other viruses. If result is PRESUMPTIVE POSTIVE SARS-CoV-2 nucleic acids MAY BE PRESENT.   A presumptive positive result was obtained on the submitted specimen  and confirmed on repeat testing.  While 2019 novel coronavirus  (SARS-CoV-2) nucleic acids may be present in the submitted sample  additional confirmatory testing may be necessary for  epidemiological  and / or clinical management purposes  to differentiate between  SARS-CoV-2 and other Sarbecovirus currently known to infect humans.  If clinically indicated additional testing with an alternate test  methodology [phone]) is advised. The SARS-CoV-2 RNA is generally  detectable in upper and lower respiratory sp ecimens during the acute  phase of infection. The expected result is Negative. Fact Sheet for Patients:  StrictlyIdeas.no Fact Sheet for Healthcare Providers: BankingDealers.co.za This test is not yet approved or cleared by the [place] FDA and has been authorized for detection and/or diagnosis of SARS-CoV-2 by FDA under an Emergency Use Authorization (EUA).  This EUA will remain in effect (meaning this test can be used) for the duration of the COVID-19 declaration under Section 564(b)(1) of the Act, 21 U.S.C. section 360bbb-3(b)(1), unless the authorization is terminated or revoked sooner. Performed at [facility], [place] [address], [place], [place] [address]   Blood culture (routine x 2)     Status: None (Preliminary result)   Collection Time: 07/14/18  5:40 PM   Specimen: Site Not Specified; Blood  Result Value Ref Range Status   Specimen Description   Final    SITE  NOT SPECIFIED Performed at [facility], [place] [address] [place], [place] [address]    Special Requests   Final    BOTTLES DRAWN AEROBIC AND ANAEROBIC Blood Culture adequate volume Performed at [place] [address], [place], [place] [address]    Culture PENDING  Incomplete   Report Status PENDING  Incomplete  SARS Coronavirus 2 (CEPHEID - Performed in [facility] hospital lab), Hosp Order     Status: None   Collection Time: 07/14/18  6:16 PM   Specimen: Nasopharyngeal Swab  Result Value Ref Range Status   SARS Coronavirus 2 NEGATIVE NEGATIVE Final    Comment: (NOTE) If result is  NEGATIVE SARS-CoV-2 target nucleic acids are NOT DETECTED. The SARS-CoV-2 RNA is generally detectable in upper and lower  respiratory specimens during the acute phase of infection. The lowest  concentration of SARS-CoV-2 viral copies this assay can detect is 250  copies / mL. A negative result does not preclude SARS-CoV-2 infection  and should not be used as the sole basis for treatment or other  patient management decisions.  A negative result may occur with  improper specimen collection / handling, submission of specimen other  than nasopharyngeal swab, presence of viral mutation(s) within the  areas targeted by this assay, and inadequate number of viral copies  (<250 copies / mL). A negative result must be combined with clinical  observations, patient history, and epidemiological information. If result is POSITIVE SARS-CoV-2 target nucleic acids are DETECTED. The SARS-CoV-2 RNA is generally detectable in upper and lower  respiratory specimens dur ing the acute phase of infection.  Positive  results are indicative of active infection with SARS-CoV-2.  Clinical  correlation with patient history and other diagnostic information is  necessary to determine patient infection status.  Positive results do  not rule out bacterial infection or co-infection with other viruses. If result is PRESUMPTIVE POSTIVE SARS-CoV-2 nucleic acids MAY BE PRESENT.   A presumptive positive result was obtained on the submitted specimen  and confirmed on repeat testing.  While 2019 novel coronavirus  (SARS-CoV-2) nucleic acids may be present in the submitted sample  additional confirmatory testing may be necessary for epidemiological  and / or clinical management purposes  to differentiate between  SARS-CoV-2 and other Sarbecovirus currently known to infect humans.  If clinically indicated additional testing with an alternate test  methodology [phone]) is advised. The SARS-CoV-2 RNA is generally  detectable  in upper and lower respiratory sp ecimens during the acute  phase of infection. The expected result is Negative. Fact Sheet for Patients:  StrictlyIdeas.no Fact Sheet for Healthcare Providers: BankingDealers.co.za This test is not yet approved or cleared by the [place] FDA and has been authorized for detection and/or diagnosis of SARS-CoV-2 by FDA under an Emergency Use Authorization (EUA).  This EUA will remain in effect (meaning this test can be used) for the duration of the COVID-19 declaration under Section 564(b)(1) of the Act, 21 U.S.C. section 360bbb-3(b)(1), unless the authorization is terminated or revoked sooner. Performed at [facility], [place] [address] [place], [place] [address]       Studies: Dg Chest 2 View  Result Date: 07/14/2018 CLINICAL DATA:  63 year old with current history of stage IV LEFT lung cancer and chronic malignant LEFT pleural effusion with a PleurX catheter in place. Patient presents stating that the PleurX catheter is no longer draining and complains of acute shortness of breath. EXAM: CHEST - 2 VIEW COMPARISON:  CT  chest 06/30/2018 and earlier. Chest x-ray 06/30/2018. FINDINGS: Moderate-sized LEFT pleural effusion, localized to the base and mid LEFT hemithorax. The PleurX catheter is positioned posteriorly and medially in the LEFT hemithorax. Associated consolidation in the LEFT LOWER LOBE and lingula. Small nodule involving the RIGHT mid lung as noted on the recent CT. RIGHT lung otherwise clear. No RIGHT pleural effusion. Cardiac silhouette normal in size, unchanged. Thoracic aorta mildly atherosclerotic, unchanged. RIGHT jugular Port-A-Cath tip in the LOWER SVC. Degenerative changes involving thoracic spine. IMPRESSION: 1. Moderate-sized LEFT pleural effusion localized to the base and mid LEFT hemithorax. Associated passive atelectasis and/or pneumonia involving the LEFT LOWER LOBE and  lingula. 2. The PleurX catheter is positioned posteriorly and medially in the LEFT hemithorax. 3. Small nodule involving the RIGHT mid lung indicating metastasis, as noted on the recent CT. Electronically Signed   By: Evangeline Dakin M.D.   On: 07/14/2018 16:16    Scheduled Meds:  amLODipine  5 mg Oral Daily   aspirin EC  81 mg Oral Daily   benzonatate  200 mg Oral TID   chlorpheniramine-HYDROcodone  5 mL Oral Q12H   enoxaparin (LOVENOX) injection  40 mg Subcutaneous Q24H   feeding supplement (ENSURE ENLIVE)  237 mL Oral BID BM   gabapentin  300 mg Oral TID   ipratropium  0.5 mg Nebulization TID   levalbuterol  0.63 mg Nebulization TID   metoprolol tartrate  25 mg Oral BID   nicotine  21 mg Transdermal Q24H   PARoxetine  20 mg Oral BID   senna-docusate  2 tablet Oral BID    Continuous Infusions:  ceFEPime (MAXIPIME) IV 2 g (07/15/18 0935)     LOS: 1 day     Kayleen Memos, MD [facility] Pager [phone]  If 7PM-7AM, please contact night-coverage [email or web address] Password [email or web address] 07/15/2018, 10:59 AM

## 2018-07-15 NOTE — Progress Notes (Signed)
Initial Nutrition Assessment RD working remotely.  DOCUMENTATION CODES:   Not applicable  INTERVENTION:  -Boost Breeze po TID, each supplement provides 250 kcal and 9 grams of protein -Prostat po BID, each supplement provides 100 kcal and 15 grams of protien -MVI -diet advancement as tolerated   NUTRITION DIAGNOSIS:   Increased nutrient needs related to cancer and cancer related treatments as evidenced by estimated needs, percent weight loss.  GOAL:   Patient will meet greater than or equal to 90% of their needs   MONITOR:   Diet advancement, Supplement acceptance, Weight trends, PO intake, Labs  REASON FOR ASSESSMENT:   Malnutrition Screening Tool    ASSESSMENT:  63 year old female with medical history significant for left breast cancer s/p mastectomy with ongoing chemotherapy, HTN, recurrent malignant left pleural effusion s/p Pleurx, COPD on 2L O2 presented to ED with worsening SOB, cough and worsening left chest pain.  Pt with recent [facility] admissions for chest pain related to pleural effusions, thoracentesis by IR x 2. Pleurx cath placed in May. Per chart review, pt reports the catheter has not been draining well over the past few days.   Patient receiving palliative chemo w/ Halaven that started on 6/4 7/2: cycle 2  Per oncology note, patient pancytopenic related to recent chemotherapy  Patient currently on CL with 0% intake for breakfast and 90% of lunch today.  Weights reviewed - noted downward trends x 1 months with 12lb (7%) wt loss in the past month Severe for time frame  NUTRITION - FOCUSED PHYSICAL EXAM: Unable to access at this time  Diet Order:   Diet Order            Diet clear liquid Room service appropriate? Yes; Fluid consistency: Thin  Diet effective now              EDUCATION NEEDS:   Not appropriate for education at this time  Skin:  Skin Assessment: Reviewed RN Assessment  Last BM:  7/8  Height:   Ht Readings from Last 1  Encounters:  07/14/18 5\' 7"  (1.702 m)    Weight:   Wt Readings from Last 1 Encounters:  07/14/18 72.1 kg    Ideal Body Weight:  61.4 kg  BMI:  Body mass index is 24.89 kg/m.  Estimated Nutritional Needs:   Kcal:  9528-4132  Protein:  94-108g  Fluid:  2L    Lajuan Lines, RD, LDN  After Hours/Weekend Pager: [phone]

## 2018-07-15 NOTE — Progress Notes (Signed)
ONCOLOGY  Hospitalization for shortness of breath and pleuritic chest pain with metastatic breast cancer currently on palliative chemotherapy with Halaven that started 06/09/2018 Patient received cycle 2 on 07/07/2018 Today's blood counts were reviewed  CBC Latest Ref Rng & Units 07/15/2018 07/14/2018 07/07/2018  WBC 4.0 - 10.5 K/uL 1.4(LL) 1.5(L) 6.8  Hemoglobin 12.0 - 15.0 g/dL 8.9(L) 10.4(L) 10.6(L)  Hematocrit 36.0 - 46.0 % 30.6(L) 34.4(L) 34.4(L)  Platelets 150 - 400 K/uL 317 369 491(H)    Patient is pancytopenic as a result of her recent chemotherapy. Since there is a presumptive diagnosis of infection and she is on broad-spectrum antibiotics, we will start injection with Granix until [place] crosses 1000  Pleurx catheter problems: IR has been working with her in the past.   Thank you for the excellent care.

## 2018-07-15 NOTE — TOC Initial Note (Signed)
Transition of Care [facility]) - Initial/Assessment Note    Patient Details  Name: Deborah Mcintyre MRN: [id number] Date of Birth: May 18, 1955  [facility] CM/SW Contact:    Dessa Phi, RN Phone Number: 07/15/2018, 2:02 PM  Clinical Narrative: From home. Hx: Breast Ca. PT cons-await recc.                  Expected Discharge Plan: Home/Self Care Barriers to Discharge: Continued Medical Work up   Patient Goals and CMS Choice Patient states their goals for this hospitalization and ongoing recovery are:: go home      Expected Discharge Plan and Services Expected Discharge Plan: Home/Self Care   Discharge Planning Services: CM Consult   Living arrangements for the past 2 months: Single Family Home                                      Prior Living Arrangements/Services Living arrangements for the past 2 months: Single Family Home Lives with:: Self Patient language and need for interpreter reviewed:: Yes Do you feel safe going back to the place where you live?: Yes      Need for Family Participation in Patient Care: No (Comment) Care giver support system in place?: Yes (comment)   Criminal Activity/Legal Involvement Pertinent to Current Situation/Hospitalization: No - Comment as needed  Activities of Daily Living Home Assistive Devices/Equipment: Oxygen ADL Screening (condition at time of admission) Patient's cognitive ability adequate to safely complete daily activities?: Yes Is the patient deaf or have difficulty hearing?: No Does the patient have difficulty seeing, even when wearing glasses/contacts?: No Does the patient have difficulty concentrating, remembering, or making decisions?: No Patient able to express need for assistance with ADLs?: Yes Does the patient have difficulty dressing or bathing?: No Independently performs ADLs?: Yes (appropriate for developmental age) Does the patient have difficulty walking or climbing stairs?: Yes Weakness of  Legs: Both Weakness of Arms/Hands: None  Permission Sought/Granted Permission sought to share information with : Case Manager Permission granted to share information with : Yes, Verbal Permission Granted              Emotional Assessment Appearance:: Appears stated age Attitude/Demeanor/Rapport: Gracious Affect (typically observed): Accepting Orientation: : Oriented to Self, Oriented to Place, Oriented to  Time, Oriented to Situation Alcohol / Substance Use: Tobacco Use, Alcohol Use Psych Involvement: No (comment)  Admission diagnosis:  Malfunction of device [T85.618A] Patient Active Problem List   Diagnosis Date Noted  . HCAP (healthcare-associated pneumonia) 07/14/2018  . Loculated pleural effusion 06/30/2018  . Hypokalemia 06/30/2018  . Port-A-Cath in place 06/24/2018  . Status post chest tube placement ([place])   . Tobacco abuse 06/01/2018  . Prolonged QT interval 06/01/2018  . Chest pain 06/01/2018  . Elevated troponin 06/01/2018  . Cigarette smoker 05/27/2018  . Goals of care, counseling/discussion 05/19/2018  . 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  . Hypertension 05/12/2018  . COPD (chronic obstructive pulmonary disease) ([place]) 05/12/2018  . Anxiety and depression 05/12/2018  . Community acquired pneumonia of left lung 02/04/2017  . Antineoplastic chemotherapy induced pancytopenia ([place]) 04/26/2015  . Antineoplastic chemotherapy induced anemia 04/05/2015  . Breast cancer of upper-outer quadrant of left female breast ([place]) 01/01/2015  . Acute exacerbation of chronic bronchitis ([place]) 08/03/2013   PCP:  [name], MD Pharmacy:   [place], [facility]  [place] [address] [place] [address] Phone: [phone] Fax: [phone]     Social Determinants of Health (SDOH) Interventions    Readmission Risk Interventions Readmission Risk Prevention Plan 07/15/2018   Transportation Screening Complete  Medication Review ([place]) Complete  PCP or Specialist appointment within 3-5 days of discharge Complete  HRI or [place] Complete  SW Recovery Care/Counseling Consult Complete  [place] Not Applicable  Some recent data might be hidden

## 2018-07-15 NOTE — Progress Notes (Signed)
Patient ID: Deborah Mcintyre, female   DOB: [birth year], 63 y.o.   MRN: [id number] Asked to eval pt's left pleurx due to minimal output/leaking at insertion site; on exam new dressing in place but outer cap of pleurx  missing; pleurx flushed with NS and subsequently drained with 350 cc amber fluid removed. Procedure halted afterwards due to pt coughing. New dressing/cap applied. Recommend draining Pleurx only if patient symptomatic or considerable leaking recurs at site and have pt sit upright during fluid removal. Collection is multiloculated therefore variation in amounts drained expected. Call [phone] with any drain related questions or [phone] on weekends and ask to speak with IR MD on call.

## 2018-07-16 LAB — CBC WITH DIFFERENTIAL/PLATELET
Abs Immature Granulocytes: 0.08 10*3/uL — ABNORMAL HIGH (ref 0.00–0.07)
Basophils Absolute: 0 10*3/uL (ref 0.0–0.1)
Basophils Relative: 1 %
Eosinophils Absolute: 0 10*3/uL (ref 0.0–0.5)
Eosinophils Relative: 0 %
HCT: 32 % — ABNORMAL LOW (ref 36.0–46.0)
Hemoglobin: 9.3 g/dL — ABNORMAL LOW (ref 12.0–15.0)
Immature Granulocytes: 4 %
Lymphocytes Relative: 43 %
Lymphs Abs: 0.9 10*3/uL (ref 0.7–4.0)
MCH: 27.2 pg (ref 26.0–34.0)
MCHC: 29.1 g/dL — ABNORMAL LOW (ref 30.0–36.0)
MCV: 93.6 fL (ref 80.0–100.0)
Monocytes Absolute: 0.8 10*3/uL (ref 0.1–1.0)
Monocytes Relative: 39 %
Neutro Abs: 0.3 10*3/uL — ABNORMAL LOW (ref 1.7–7.7)
Neutrophils Relative %: 13 %
Platelets: 363 10*3/uL (ref 150–400)
RBC: 3.42 MIL/uL — ABNORMAL LOW (ref 3.87–5.11)
RDW: 14.1 % (ref 11.5–15.5)
WBC: 2 10*3/uL — ABNORMAL LOW (ref 4.0–10.5)
nRBC: 1.5 % — ABNORMAL HIGH (ref 0.0–0.2)

## 2018-07-16 MED ORDER — IPRATROPIUM BROMIDE 0.02 % IN SOLN
0.5000 mg | Freq: Two times a day (BID) | RESPIRATORY_TRACT | Status: DC
  Administered 2018-07-16 – 2018-07-18 (×4): 0.5 mg via RESPIRATORY_TRACT
  Filled 2018-07-16 (×4): qty 2.5

## 2018-07-16 MED ORDER — LEVALBUTEROL HCL 0.63 MG/3ML IN NEBU
0.6300 mg | INHALATION_SOLUTION | Freq: Two times a day (BID) | RESPIRATORY_TRACT | Status: DC
  Administered 2018-07-16 – 2018-07-18 (×4): 0.63 mg via RESPIRATORY_TRACT
  Filled 2018-07-16 (×4): qty 3

## 2018-07-16 MED ORDER — HYDROMORPHONE HCL 1 MG/ML IJ SOLN
0.5000 mg | INTRAMUSCULAR | Status: DC | PRN
  Administered 2018-07-16 – 2018-07-17 (×3): 0.5 mg via INTRAVENOUS
  Filled 2018-07-16 (×4): qty 0.5

## 2018-07-16 NOTE — Progress Notes (Signed)
PROGRESS NOTE  Deborah Mcintyre LTJ:[id number] DOB: [birth year] DOA: 07/14/2018 PCP: [name], MD  HPI/Recap of past 24 hours: Deborah Mcintyre is a 63 y.o. female with medical history significant for left breast cancer status post mastectomy with ongoing chemotherapy, chronic anxiety and depression, hypertension, recurrent malignant left pleural effusion status post Pleurx catheter placement in May 2020 by interventional radiology, COPD on 2 L of oxygen via nasal cannula as needed, ongoing tobacco use who presented to [facility] ED with complaints of worsening shortness of breath, cough and pleuritic left chest pain.  Patient reports in the last 2 days her left chest pain has become increasingly worse to the point where she finds it difficult to take a breath.  States her pleural catheter has not been draining well.  Her pain is hardly improved with her home pain medications.  Associated symptoms include nausea with no vomiting, chills and night sweats.  Due to worsening pain she decided to come to the ED for further evaluation.  Her last chemotherapy was 1 week ago.  ED Course: Upon presentation to the ED chest x-ray revealed recurrent left pleural effusion with possible left lower lobe infiltrates, lab studies remarkable for significant leukopenia with WBC 1.5 and neutropenia with neutrophil 0.4.  She was started on IV antibiotics empirically due to suspicion for HCAP.  07/15/18: Patient seen and examined at her bedside.  Reports left-sided lateral chest pain improved with IV Dilaudid.  O2 saturation 97% on room air.  IR has been consulted for possible assessment of Pleurx and drainage of left pleural effusion.  07/16/18: Patient seen and examined at her bedside.  States she feels a little better this morning.  She still has the pain on her left lateral chest improved with IV pain medication.  Afebrile.  Started on Granix yesterday by oncology.  Assessment/Plan: Active Problems:   HCAP  (healthcare-associated pneumonia)   HCAP, POA In the setting of immunosuppression with last chemotherapy a week ago Independently reviewed chest x-ray done on admission which shows hyperinflated lungs with left pleural effusion, possible infiltrates and right middle lobe infiltrate versus atelectasis Continue cefepime Cultures are negative to date  Recurrent malignant left pleural effusion IR consulted to assess Pleurx catheter and for possible drainage Rest of management as per above Optimize pain control Per interventional radiology, recommendations to draining Pleurx only if patient symptomatic or considerable leaking recurs at site and have pt sit upright during fluid removal. Collection is multiloculated therefore variation in amounts drained expected.  Improving leukopenia/neutropenia on Granix Afebrile Continue neutropenic precautions Started on Granix on 07/15/2018 by oncology Obtain daily CBC with differential while on Granix Monitor fever curve Patient follows with Dr. 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 Follow up with your oncologist outpatient post discharge  COPD on 2 L of oxygen by nasal cannula as needed Maintain O2 saturation greater than 92% Home O2 evaluation prior to discharge Continue inhalers Xopenex and ipratropium Continue Tussionex for cough and pleuritic pain O2 sat 91% on room air  Resolved sinus tachycardia likely driven by lung physiology IR consulted for possible left pleural effusion drainage She has Pleurx in place,  reports malfunctioning Resume beta-blockers Continue to hold of albuterol due to recent tachycardia Continue to closely monitor on telemetry  Resolved uncontrolled hypertension Blood pressure is normotensive Resume home antihypertensive medications Continue to closely monitor vital signs  Moderate protein calorie malnutrition Albumin 2.7 Reports significant weight loss and loss of appetite Denies odynophagia or dysphagia Continue oral supplements Advance diet as tolerated  Resolved hypo-kalemia post repletion  Chronic anxiety/depression Continue home medications  Tobacco use disorder, ongoing Tobacco cessation counseling done at bedside.  Patient states she has cut down significantly on tobacco use.    DVT prophylaxis: Subcu Lovenox daily  Code Status: Full code  Family Communication: Call family if okay with the patient.  Disposition Plan:  Possible discharge to home in 1 to 2 days when [facility] is 1 or greater.  Consults called: Heme oncology, Dr Franklyn Lor.  IR has been consulted to assess Pleurx and for possible drainage.      Objective: Vitals:   07/15/18 1932 07/15/18 2139 07/16/18 0742 07/16/18 0743  BP:  107/74    Pulse:  (!) 105    Resp:  18    Temp:  98.3 F (36.8 C)    TempSrc:  Oral    SpO2: 90% 92% 91% 91%  Weight:      Height:        Intake/Output Summary (Last 24 hours) at 07/16/2018 1000 Last data filed at 07/15/2018 1555 Gross per 24 hour  Intake 28.31 ml  Output 350 ml  Net -321.69 ml   Filed Weights   07/14/18 2011  Weight: 72.1 kg    Exam:  . General: 63 y.o. year-old female developed well-nourished no acute distress.  Alert and oriented x3.   . Cardiovascular: Regular rate and rhythm no rubs or gallops no JVD or thyromegaly . Respiratory: Mild rales at bases no wheezes.  Poor inspiratory effort.   . Abdomen: Soft nontender nondistended Pleurx catheter in place left lateral chest. . Musculoskeletal: No lower extremity  edema.  12 4 pulses in all 4 extremities. [name] Psychiatry: Mood is appropriate for condition and setting.   Data Reviewed: CBC: Recent Labs  Lab 07/14/18 1606 07/15/18 0500 07/16/18 0751  WBC 1.5* 1.4* 2.0*  NEUTROABS 0.4* 0.3* 0.3*  HGB 10.4* 8.9* 9.3*  HCT 34.4* 30.6* 32.0*  MCV 92.5 93.6 93.6  PLT 369 317 637   Basic Metabolic Panel: Recent Labs  Lab 07/14/18 1606 07/15/18 0500  NA 140 141  K 3.2* 3.7  CL 98 103  CO2 28 27  GLUCOSE 89 97  BUN 9 8  CREATININE 0.70 0.66  CALCIUM 8.5* 8.2*  MG  --  2.2  PHOS  --  2.6   GFR: Estimated Creatinine Clearance: 70 mL/min (by C-G formula based on SCr of 0.66 mg/dL). Liver Function Tests: Recent Labs  Lab 07/14/18 1606  AST 12*  ALT 9  ALKPHOS 49  BILITOT 0.6  PROT 6.1*  ALBUMIN 2.7*   No results for input(s): LIPASE, AMYLASE in the last 168 hours. No results for input(s): AMMONIA in the last 168 hours. Coagulation Profile: No results for input(s): INR, PROTIME in the last 168 hours. Cardiac Enzymes: No results for input(s): CKTOTAL, CKMB, CKMBINDEX, TROPONINI in the last 168 hours. BNP (last 3 results) No results for input(s): PROBNP in the last 8760 hours. HbA1C: No results for input(s): HGBA1C in the last 72 hours. CBG: No results for input(s): GLUCAP in the last 168 hours. Lipid Profile: No results for input(s): CHOL, HDL, LDLCALC,  TRIG, CHOLHDL, LDLDIRECT in the last 72 hours. Thyroid Function Tests: No results for input(s): TSH, T4TOTAL, FREET4, T3FREE, THYROIDAB in the last 72 hours. Anemia Panel: No results for input(s): VITAMINB12, FOLATE, FERRITIN, TIBC, IRON, RETICCTPCT in the last 72 hours. Urine analysis:    Component Value Date/Time   COLORURINE YELLOW 12/06/2014 2207   APPEARANCEUR CLOUDY (A) 12/06/2014 2207   LABSPEC 1.025 12/06/2014 2207   PHURINE 5.5 12/06/2014 2207   GLUCOSEU NEGATIVE 12/06/2014 2207   HGBUR MODERATE (A) 12/06/2014 2207   BILIRUBINUR NEGATIVE 12/06/2014 2207    KETONESUR NEGATIVE 12/06/2014 2207   PROTEINUR NEGATIVE 12/06/2014 2207   UROBILINOGEN 1.0 07/30/2013 1527   NITRITE POSITIVE (A) 12/06/2014 2207   LEUKOCYTESUR SMALL (A) 12/06/2014 2207   Sepsis Labs: @LABRCNTIP (procalcitonin:4,lacticidven:4)  ) Recent Results (from the past 240 hour(s))  SARS Coronavirus 2 (CEPHEID - Performed in [place] hospital lab), Hosp Order     Status: None   Collection Time: 07/07/18  1:40 PM   Specimen: Nasopharyngeal Swab  Result Value Ref Range Status   SARS Coronavirus 2 NEGATIVE NEGATIVE Final    Comment: (NOTE) If result is NEGATIVE SARS-CoV-2 target nucleic acids are NOT DETECTED. The SARS-CoV-2 RNA is generally detectable in upper and lower  respiratory specimens during the acute phase of infection. The lowest  concentration of SARS-CoV-2 viral copies this assay can detect is 250  copies / mL. A negative result does not preclude SARS-CoV-2 infection  and should not be used as the sole basis for treatment or other  patient management decisions.  A negative result may occur with  improper specimen collection / handling, submission of specimen other  than nasopharyngeal swab, presence of viral mutation(s) within the  areas targeted by this assay, and inadequate number of viral copies  (<250 copies / mL). A negative result must be combined with clinical  observations, patient history, and epidemiological information. If result is POSITIVE SARS-CoV-2 target nucleic acids are DETECTED. The SARS-CoV-2 RNA is generally detectable in upper and lower  respiratory specimens dur ing the acute phase of infection.  Positive  results are indicative of active infection with SARS-CoV-2.  Clinical  correlation with patient history and other diagnostic information is  necessary to determine patient infection status.  Positive results do  not rule out bacterial infection or co-infection with other viruses. If result is PRESUMPTIVE POSTIVE SARS-CoV-2 nucleic  acids MAY BE PRESENT.   A presumptive positive result was obtained on the submitted specimen  and confirmed on repeat testing.  While 2019 novel coronavirus  (SARS-CoV-2) nucleic acids may be present in the submitted sample  additional confirmatory testing may be necessary for epidemiological  and / or clinical management purposes  to differentiate between  SARS-CoV-2 and other Sarbecovirus currently known to infect humans.  If clinically indicated additional testing with an alternate test  methodology [phone]) is advised. The SARS-CoV-2 RNA is generally  detectable in upper and lower respiratory sp ecimens during the acute  phase of infection. The expected result is Negative. Fact Sheet for Patients:  StrictlyIdeas.no Fact Sheet for Healthcare Providers: BankingDealers.co.za This test is not yet approved or cleared by the [place] FDA and has been authorized for detection and/or diagnosis of SARS-CoV-2 by FDA under an Emergency Use Authorization (EUA).  This EUA will remain in effect (meaning this test can be used) for the duration of the COVID-19 declaration under Section 564(b)(1) of the Act, 21 U.S.C. section 360bbb-3(b)(1), unless the authorization is terminated or revoked sooner. Performed  at [facility], [place] [address], [place], [place] [address]   Blood culture (routine x 2)     Status: None (Preliminary result)   Collection Time: 07/14/18  5:30 PM   Specimen: BLOOD  Result Value Ref Range Status   Specimen Description   Final    BLOOD PORTA CATH Performed at [facility] [address], [place], [place] [address]    Special Requests   Final    BOTTLES DRAWN AEROBIC AND ANAEROBIC Blood Culture adequate volume Performed at [facility] [address] [place], [place] [address]    Culture   Final    NO GROWTH < 24 HOURS Performed at [facility] [address] [place], [place] [address]    Report Status PENDING  Incomplete  Blood culture (routine x 2)     Status: None (Preliminary result)   Collection Time: 07/14/18  5:40 PM   Specimen: Site Not Specified; Blood  Result Value Ref Range Status   Specimen Description   Final    SITE NOT SPECIFIED Performed at [facility], [place] [address] [place], [place] [address]    Special Requests   Final    BOTTLES DRAWN AEROBIC AND ANAEROBIC Blood Culture adequate volume Performed at [facility] [address] [place], [place] [address]    Culture   Final    NO GROWTH < 24 HOURS Performed at [facility] [address], [place], [place] [address]    Report Status PENDING  Incomplete  SARS Coronavirus 2 (CEPHEID - Performed in [place] hospital lab), Hosp Order     Status: None   Collection Time: 07/14/18  6:16 PM   Specimen: Nasopharyngeal Swab  Result Value Ref Range Status   SARS Coronavirus 2 NEGATIVE NEGATIVE Final    Comment: (NOTE) If result is NEGATIVE SARS-CoV-2 target nucleic acids are NOT DETECTED. The SARS-CoV-2 RNA is generally detectable in upper and lower  respiratory specimens during the acute phase of infection. The lowest  concentration of SARS-CoV-2 viral copies this assay can detect is 250  copies / mL. A negative result does not preclude SARS-CoV-2 infection  and should not be used as the sole basis for treatment or other  patient management decisions.  A negative result may occur with  improper specimen collection / handling, submission of specimen other  than nasopharyngeal swab, presence of viral mutation(s) within the  areas targeted by this assay, and inadequate number of viral copies  (<250 copies / mL). A negative result must be combined with clinical  observations, patient history, and epidemiological information. If result is POSITIVE SARS-CoV-2 target nucleic acids are DETECTED. The SARS-CoV-2 RNA is generally  detectable in upper and lower  respiratory specimens dur ing the acute phase of infection.  Positive  results are indicative of active infection with SARS-CoV-2.  Clinical  correlation with patient history and other diagnostic information is  necessary to determine patient infection status.  Positive results do  not rule out bacterial infection or co-infection with other viruses. If result is PRESUMPTIVE POSTIVE SARS-CoV-2 nucleic acids MAY BE PRESENT.   A presumptive positive result was obtained on the submitted specimen  and confirmed on repeat testing.  While 2019 novel coronavirus  (SARS-CoV-2) nucleic acids may be present in the submitted sample  additional confirmatory testing may be necessary for epidemiological  and / or clinical management purposes  to differentiate between  SARS-CoV-2 and  other Sarbecovirus currently known to infect humans.  If clinically indicated additional testing with an alternate test  methodology [phone]) is advised. The SARS-CoV-2 RNA is generally  detectable in upper and lower respiratory sp ecimens during the acute  phase of infection. The expected result is Negative. Fact Sheet for Patients:  StrictlyIdeas.no Fact Sheet for Healthcare Providers: BankingDealers.co.za This test is not yet approved or cleared by the [place] FDA and has been authorized for detection and/or diagnosis of SARS-CoV-2 by FDA under an Emergency Use Authorization (EUA).  This EUA will remain in effect (meaning this test can be used) for the duration of the COVID-19 declaration under Section 564(b)(1) of the Act, 21 U.S.C. section 360bbb-3(b)(1), unless the authorization is terminated or revoked sooner. Performed at [facility], [place] [address] [place], [place] [address]       Studies: No results found.  Scheduled Meds: . amLODipine  5 mg Oral Daily  . aspirin EC  81 mg Oral Daily  .  benzonatate  200 mg Oral TID  . enoxaparin (LOVENOX) injection  40 mg Subcutaneous Q24H  . feeding supplement  1 Container Oral TID BM  . feeding supplement (PRO-STAT SUGAR FREE 64)  30 mL Oral BID  . gabapentin  300 mg Oral TID  . ipratropium  0.5 mg Nebulization BID  . levalbuterol  0.63 mg Nebulization BID  . metoprolol tartrate  25 mg Oral BID  . multivitamin with minerals  1 tablet Oral Daily  . nicotine  21 mg Transdermal Q24H  . PARoxetine  20 mg Oral BID  . senna-docusate  2 tablet Oral BID  . Tbo-filgastrim (GRANIX) SQ  480 mcg Subcutaneous q1800    Continuous Infusions: . ceFEPime (MAXIPIME) IV 2 g (07/16/18 0211)     LOS: 2 days     Kayleen Memos, MD [facility] Pager [phone]  If 7PM-7AM, please contact night-coverage [email or web address] Password [email or web address] 07/16/2018, 10:00 AM

## 2018-07-16 NOTE — Plan of Care (Signed)

## 2018-07-17 LAB — CBC WITH DIFFERENTIAL/PLATELET
Abs Immature Granulocytes: 0.5 10*3/uL — ABNORMAL HIGH (ref 0.00–0.07)
Band Neutrophils: 13 %
Basophils Absolute: 0 10*3/uL (ref 0.0–0.1)
Basophils Relative: 0 %
Eosinophils Absolute: 0 10*3/uL (ref 0.0–0.5)
Eosinophils Relative: 0 %
HCT: 31.6 % — ABNORMAL LOW (ref 36.0–46.0)
Hemoglobin: 9.3 g/dL — ABNORMAL LOW (ref 12.0–15.0)
Lymphocytes Relative: 28 %
Lymphs Abs: 1 10*3/uL (ref 0.7–4.0)
MCH: 27.8 pg (ref 26.0–34.0)
MCHC: 29.4 g/dL — ABNORMAL LOW (ref 30.0–36.0)
MCV: 94.6 fL (ref 80.0–100.0)
Metamyelocytes Relative: 6 %
Monocytes Absolute: 0.7 10*3/uL (ref 0.1–1.0)
Monocytes Relative: 18 %
Myelocytes: 6 %
Neutro Abs: 1.5 10*3/uL — ABNORMAL LOW (ref 1.7–7.7)
Neutrophils Relative %: 28 %
Platelets: 339 10*3/uL (ref 150–400)
Promyelocytes Relative: 1 %
RBC: 3.34 MIL/uL — ABNORMAL LOW (ref 3.87–5.11)
RDW: 14.2 % (ref 11.5–15.5)
WBC: 3.7 10*3/uL — ABNORMAL LOW (ref 4.0–10.5)
nRBC: 2.5 % — ABNORMAL HIGH (ref 0.0–0.2)

## 2018-07-17 MED ORDER — OXYCODONE-ACETAMINOPHEN 5-325 MG PO TABS
1.0000 | ORAL_TABLET | Freq: Four times a day (QID) | ORAL | Status: DC | PRN
  Administered 2018-07-17: 2 via ORAL
  Administered 2018-07-17 – 2018-07-18 (×3): 1 via ORAL
  Filled 2018-07-17 (×2): qty 2
  Filled 2018-07-17 (×2): qty 1

## 2018-07-17 MED ORDER — BENZONATATE 200 MG PO CAPS: 200.0000 mg | ORAL_CAPSULE | Freq: Two times a day (BID) | ORAL | 0 refills | Status: DC | PRN

## 2018-07-17 MED ORDER — GABAPENTIN 400 MG PO CAPS
400.0000 mg | ORAL_CAPSULE | Freq: Three times a day (TID) | ORAL | Status: DC
  Administered 2018-07-17 – 2018-07-18 (×4): 400 mg via ORAL
  Filled 2018-07-17 (×4): qty 1

## 2018-07-17 MED ORDER — NICOTINE 21 MG/24HR TD PT24: 21.0000 mg | MEDICATED_PATCH | TRANSDERMAL | 0 refills | Status: DC

## 2018-07-17 MED ORDER — NYSTATIN 100000 UNIT/ML MT SUSP
5.0000 mL | Freq: Four times a day (QID) | OROMUCOSAL | Status: DC
  Administered 2018-07-17 – 2018-07-18 (×4): 500000 [IU] via OROMUCOSAL
  Filled 2018-07-17 (×4): qty 5

## 2018-07-17 MED ORDER — ADULT MULTIVITAMIN W/MINERALS CH: 1.0000 | ORAL_TABLET | Freq: Every day | ORAL | 0 refills | Status: AC

## 2018-07-17 MED ORDER — OXYCODONE-ACETAMINOPHEN 5-325 MG PO TABS: 1.0000 | ORAL_TABLET | Freq: Three times a day (TID) | ORAL | 0 refills | Status: DC | PRN

## 2018-07-17 MED ORDER — SENNOSIDES-DOCUSATE SODIUM 8.6-50 MG PO TABS: 2.0000 | ORAL_TABLET | Freq: Two times a day (BID) | ORAL | 0 refills | Status: DC

## 2018-07-17 MED ORDER — NYSTATIN 100000 UNIT/ML MT SUSP: 5.0000 mL | Freq: Four times a day (QID) | OROMUCOSAL | 0 refills | Status: DC

## 2018-07-17 MED ORDER — ADULT MULTIVITAMIN W/MINERALS CH: 1.0000 | ORAL_TABLET | Freq: Every day | ORAL | 0 refills | Status: DC

## 2018-07-17 MED ORDER — CEPHALEXIN 500 MG PO CAPS
500.0000 mg | ORAL_CAPSULE | Freq: Three times a day (TID) | ORAL | Status: DC
  Administered 2018-07-17 – 2018-07-18 (×2): 500 mg via ORAL
  Filled 2018-07-17 (×2): qty 1

## 2018-07-17 MED ORDER — GABAPENTIN 400 MG PO CAPS: 400.0000 mg | ORAL_CAPSULE | Freq: Three times a day (TID) | ORAL | 0 refills | Status: AC

## 2018-07-17 MED ORDER — CEPHALEXIN 500 MG PO CAPS: 500.0000 mg | ORAL_CAPSULE | Freq: Three times a day (TID) | ORAL | 0 refills | Status: DC

## 2018-07-17 MED ORDER — DEXAMETHASONE 0.5 MG PO TABS
1.0000 mg | ORAL_TABLET | Freq: Every day | ORAL | Status: DC
  Administered 2018-07-17 – 2018-07-18 (×2): 1 mg via ORAL
  Filled 2018-07-17 (×2): qty 2

## 2018-07-17 NOTE — Plan of Care (Signed)

## 2018-07-17 NOTE — Discharge Instructions (Signed)
Neutropenia Neutropenia is a condition that occurs when you have a lower-than-normal level of a type of white blood cell (neutrophil) in your body. Neutrophils are made in the spongy center of large bones (bone marrow), and they fight infections. Neutrophils are your body's main defense against bacterial and fungal infections. The fewer neutrophils you have and the longer your body remains without them, the greater your risk of getting a severe infection. What are the causes? This condition can occur if your body uses up or destroys neutrophils faster than your bone marrow can make them. Neutropenia may be caused by:  A bacterial or fungal infection.  Allergic disorders.  Reactions to some medicines.  An autoimmune disease.  An enlarged spleen. This condition can also occur if your bone marrow does not produce enough neutrophils. This problem may be caused by:  Cancer.  Cancer treatments, such as radiation or chemotherapy.  Viral infections.  Medicines, such as phenytoin.  Vitamin B12 deficiency.  Diseases of the bone marrow.  Environmental toxins, such as insecticides. What are the signs or symptoms? This condition does not usually cause symptoms. If symptoms are present, they are usually caused by an underlying infection. Symptoms of an infection may include:  Fever.  Chills.  Swollen glands.  Oral or anal ulcers.  Cough and shortness of breath.  Rash.  Skin infection.  Fatigue. How is this diagnosed? Your health care provider may suspect neutropenia if you have:  A condition that may cause neutropenia.  Symptoms during or after treatment for cancer.  Symptoms of infection, especially fever.  Frequent and unusual infections. This condition is diagnosed based on your medical history and a physical exam. Tests will also be done, such as:  A complete blood count (CBC).  A procedure to collect a sample of bone marrow for examination (bone marrow  biopsy).  A chest X-ray.  A urine culture.  A blood culture. How is this treated? Treatment depends on the underlying cause and severity of your condition. Mild neutropenia may not require treatment. Treatment may include medicines, such as:  Antibiotic medicine given through an IV.  Antiviral medicines.  Antifungal medicines.  A medicine to increase neutrophil production (colony-stimulating factor). You may get this drug through an IV or by injection.  Steroids given through an IV. If an underlying condition is causing neutropenia, you may need treatment for that condition. If medicines or cancer treatments are causing neutropenia, your health care provider may have you stop the medicines or treatment. Follow these instructions at home: Medicines   Take over-the-counter and prescription medicines only as told by your health care provider.  Get a seasonal flu shot (influenza vaccine).  Avoid people who received a vaccine in the past 30 days if that vaccine contained a live version of the germ (live vaccine). You should not get a live vaccine. Common live vaccines are polio, MMR, chicken pox, and shingles vaccines. Eating and drinking  Do not share food utensils.  Do not eat unpasteurized foods.  Do not eat raw or undercooked meat, eggs, or seafood.  Do not eat unwashed, raw fruits or vegetables. Lifestyle  Avoid exposure to groups of people or children.  Avoid being around people who are sick.  Avoid being around dirt or dust, such as in construction areas or gardens.  Do not provide direct care for pets. Avoid animal droppings. Do not clean litter boxes and bird cages.  Do not have sex unless your health care provider has approved. Hygiene  Bathe daily.  Clean the area between the genitals and the anus (perineal area) after you urinate or have a bowel movement. If you are female, wipe from front to back.  Brush your teeth with a soft toothbrush before and  after meals.  Do not use a regular razor. Use an electric razor to remove hair.  Wash your hands often. Make sure others who come in contact with you also wash their hands. If soap and water are not available, use hand sanitizer. General instructions  Follow any precautions as told by your health care provider to reduce your risk for injury or infection.  Take actions to avoid cuts and burns. For example: ? Be cautious when you use knives. Always cut away from yourself. ? Keep knives in protective sheaths or guards when not in use. ? Use oven mitts when you cook with a hot stove, oven, or grill. ? Stand a safe distance away from open fires.  Do not use tampons, enemas, or rectal suppositories unless your health care provider has approved.  Keep all follow-up visits as told by your health care provider. This is important. Contact a health care provider if:  You have: ? A sore throat. ? A warm, red, or tender area on your skin. ? A cough. ? Frequent or painful urination. ? Vaginal discharge or itching.  You develop: ? Sores in your mouth or anus. ? Swollen lymph nodes. ? Red streaks on the skin. ? A rash. Get help right away if:  You have: ? A fever. ? Chills, or you start to shake.  You feel: ? Nauseous, or you vomit. ? Very fatigued. ? Short of breath. Summary  Neutropenia is a condition that occurs when you have a lower-than-normal level of a type of white blood cell (neutrophil) in your body.  This condition can occur if your body uses up or destroys neutrophils faster than your bone marrow can make them.  Treatment depends on the underlying cause and severity of your condition. Mild neutropenia may not require treatment.  Follow any precautions as told by your health care provider to reduce your risk for injury or infection. This information is not intended to replace advice given to you by your health care provider. Make sure you discuss any questions you have  with your health care provider. Document Released: 06/13/2001 Document Revised: 10/07/2017 Document Reviewed: 10/07/2017 Elsevier Patient Education  2020 [employer].   Pleural Effusion Pleural effusion is an abnormal buildup of fluid in the layers of tissue between the lungs and the inside of the chest (pleural space) The two layers of tissue that line the lungs and the inside of the chest are called pleura. Usually, there is no air in the space between the pleura, only a thin layer of fluid. Some conditions can cause a large amount of fluid to build up, which can cause the lung to collapse if untreated. A pleural effusion is usually caused by another disease that requires treatment. What are the causes? Pleural effusion can be caused by:  Heart failure.  Certain infections, such as pneumonia or tuberculosis.  Cancer.  A blood clot in the lung (pulmonary embolism).  Complications from surgery, such as from open heart surgery.  Liver disease (cirrhosis).  Kidney disease. What are the signs or symptoms? In some cases, pleural effusion may cause no symptoms. If symptoms are present, they may include:  Shortness of breath, especially when lying down.  Chest pain. This may get worse when taking  a deep breath.  Fever.  Dry, long-lasting (chronic) cough.  Hiccups.  Rapid breathing. An underlying condition that is causing the pleural effusion (such as heart failure, pneumonia, blood clots, tuberculosis, or cancer) may also cause other symptoms. How is this diagnosed? This condition may be diagnosed based on:  Your symptoms and medical history.  A physical exam.  A chest X-ray.  A procedure to use a needle to remove fluid from the pleural space (thoracentesis). This fluid is tested.  Other imaging studies of the chest, such as ultrasound or CT scan. How is this treated? Depending on the cause of your condition, treatment may include:  Treating the underlying condition  that is causing the effusion. When that condition improves, the effusion will also improve. Examples of treatment for underlying conditions include: ? Antibiotic medicines to treat an infection. ? Diuretics or other heart medicines to treat heart failure.  Thoracentesis.  Placing a thin flexible tube under your skin and into your chest to continuously drain the effusion (indwelling pleural catheter).  Surgery to remove the outer layer of tissue from the pleural space (decortication).  A procedure to put medicine into the chest cavity to seal the pleural space and prevent fluid buildup (pleurodesis).  Chemotherapy and radiation therapy, if you have cancerous (malignant) pleural effusion. These treatments are typically used to treat cancer. They kill certain cells in the body. Follow these instructions at home:  Take over-the-counter and prescription medicines only as told by your health care provider.  Ask your health care provider what activities are safe for you.  Keep track of how long you are able to do mild exercise (such as walking) before you get short of breath. Write down this information to share with your health care provider. Your ability to exercise should improve over time.  Do not use any products that contain nicotine or tobacco, such as cigarettes and e-cigarettes. If you need help quitting, ask your health care provider.  Keep all follow-up visits as told by your health care provider. This is important. Contact a health care provider if:  The amount of time that you are able to do mild exercise: ? Decreases. ? Does not improve with time.  You have a fever. Get help right away if:  You are short of breath.  You develop chest pain.  You develop a new cough. Summary  Pleural effusion is an abnormal buildup of fluid in the layers of tissue between the lungs and the inside of the chest.  Pleural effusion can have many causes, including heart failure, pulmonary  embolism, infections, or cancer.  Symptoms of pleural effusion can include shortness of breath, chest pain, fever, long-lasting (chronic) cough, hiccups, or rapid breathing.  Diagnosis often involves making images of the chest (such as with ultrasound or X-ray) and removing fluid (thoracentesis) to send for testing.  Treatment for pleural effusion depends on what underlying condition is causing it. This information is not intended to replace advice given to you by your health care provider. Make sure you discuss any questions you have with your health care provider. Document Released: 12/22/2004 Document Revised: 12/04/2016 Document Reviewed: 08/27/2016 Elsevier Patient Education  2020 [employer].

## 2018-07-17 NOTE — Progress Notes (Signed)
PROGRESS NOTE  Deborah Mcintyre DGL:[id number] DOB: [birth year] DOA: 07/14/2018 PCP: [name], MD  HPI/Recap of past 24 hours: Deborah Mcintyre is a 63 y.o. female with medical history significant for left breast cancer status post mastectomy with ongoing chemotherapy, chronic anxiety and depression, hypertension, recurrent malignant left pleural effusion status post Pleurx catheter placement in May 2020 by interventional radiology, COPD on 2 L of oxygen via nasal cannula as needed, ongoing tobacco use who presented to [facility] ED with complaints of worsening shortness of breath, cough and pleuritic left chest pain.  Patient reports in the last 2 days her left chest pain has become increasingly worse to the point where she finds it difficult to take a breath.  States her pleural catheter has not been draining well.  Her pain is hardly improved with her home pain medications.  Associated symptoms include nausea with no vomiting, chills and night sweats.  Due to worsening pain she decided to come to the ED for further evaluation.  Her last chemotherapy was 1 week ago.  ED Course: Upon presentation to the ED chest x-ray revealed recurrent left pleural effusion with possible left lower lobe infiltrates, lab studies remarkable for significant leukopenia with WBC 1.5 and neutropenia with neutrophil 0.4.  She was started on IV antibiotics empirically due to suspicion for HCAP.  07/15/18: Patient seen and examined at her bedside.  Reports left-sided lateral chest pain improved with IV Dilaudid.  O2 saturation 97% on room air.  IR has been consulted for possible assessment of Pleurx and drainage of left pleural effusion.  07/16/18: Patient seen and examined at her bedside.  States she feels a little better this morning.  She still has the pain on her left lateral chest improved with IV pain medication.  Afebrile.  Started on Granix yesterday by oncology.  07/17/18: Seen and examined at her bedside.  ANC improved  to 1.5 on Granix.  Medically stable for discharge however patient states her pain is uncontrolled.  Adjusting her pain medications.  Assessment/Plan: Active Problems:   HCAP (healthcare-associated pneumonia)   HCAP, POA In the setting of immunosuppression with last chemotherapy a week ago Independently reviewed chest x-ray done on admission which shows hyperinflated lungs with left pleural effusion, possible infiltrates and right middle lobe infiltrate versus atelectasis DC cefepime, completed 4 days. Switched to Keflex x3 days Procalcitonin less than 0.10 on 07/15/2018 Afebrile Blood cultures x2 no growth in 2 days  Recurrent malignant left pleural effusion IR consulted to assess Pleurx catheter and for possible drainage Rest of management as per above Optimize pain control Per interventional radiology, recommendations to draining Pleurx only if patient symptomatic or considerable leaking recurs at site and have pt sit upright during fluid removal. Collection is multiloculated therefore variation in amounts drained expected.  Improving leukopenia/neutropenia on Granix Afebrile WBC 3.7 ANC 1.5 Granix started on 07/15/2018 by oncology Patient follows with Dr. 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 Follow up with your oncologist outpatient post discharge  COPD on 2 L of oxygen by nasal cannula as  needed Maintain O2 saturation greater than 92% Home O2  evaluation prior to discharge Continue inhalers Xopenex and ipratropium Continue Tussionex for cough and pleuritic pain Currently 98% on room air  Resolved sinus tachycardia likely driven by lung physiology IR consulted for possible left pleural effusion drainage She has Pleurx in place, reports malfunctioning Continue beta-blockers and amlodipine  Resolved uncontrolled hypertension Blood pressure is normotensive Continue Norvasc and metoprolol  Moderate protein calorie malnutrition Albumin 2.7 Reports significant weight loss and loss of appetite Denies odynophagia or dysphagia Continue oral supplements Advance diet as tolerated  Resolved hypo-kalemia post repletion  Chronic anxiety/depression Continue home medications  Tobacco use disorder, ongoing Tobacco cessation counseling done at bedside.  Patient states she has cut down significantly on tobacco use.    DVT prophylaxis: Subcu Lovenox daily  Code Status: Full code  Family Communication: Call family if okay with the patient.  Disposition Plan:  Possible discharge to home tomorrow 07/18/18 when pain is safely better controlled with oral pain medications.  Consults called: Heme oncology, Dr Franklyn Lor.  IR has been consulted to assess Pleurx and for possible drainage.      Objective: Vitals:   07/16/18 2154 07/17/18 0524 07/17/18 0821 07/17/18 0822  BP: 102/77 112/78    Pulse: 97 88    Resp: 19 17    Temp: 98.1 F (36.7 C) 98 F (36.7 C)    TempSrc: Oral     SpO2: 94% 97% 98% 98%  Weight:      Height:        Intake/Output Summary (Last 24 hours) at 07/17/2018 1120 Last data filed at 07/17/2018 0600 Gross per 24 hour  Intake 760.07 ml  Output -  Net 760.07 ml   Filed Weights   07/14/18 2011  Weight: 72.1 kg    Exam:  . General: 63 y.o. year-old female well-developed well-nourished.  No acute distress.  Alert oriented x3.   . Cardiovascular: Regular rate and rhythm.  No rubs or gallops.   No JVD or thyromegaly. [name] Respiratory: Mild rales at bases.  No wheezes.  Poor inspiratory effort.   . Abdomen: Soft nontender.  Nondistended.  Pleurx cath in place left lateral chest.. . Musculoskeletal: No lower extremity edema.  2 out of 4 pulses in all 4 extremities.. . Psychiatry: Mood is appropriate for condition and setting.   Data Reviewed: CBC: Recent Labs  Lab 07/14/18 1606 07/15/18 0500 07/16/18 0751 07/17/18 0357  WBC 1.5* 1.4* 2.0* 3.7*  NEUTROABS 0.4* 0.3* 0.3* 1.5*  HGB 10.4* 8.9* 9.3* 9.3*  HCT 34.4* 30.6* 32.0* 31.6*  MCV 92.5 93.6 93.6 94.6  PLT 369 317 363 366   Basic Metabolic Panel: Recent Labs  Lab 07/14/18 1606 07/15/18 0500  NA 140 141  K 3.2* 3.7  CL 98 103  CO2 28 27  GLUCOSE 89 97  BUN 9 8  CREATININE 0.70 0.66  CALCIUM 8.5* 8.2*  MG  --  2.2  PHOS  --  2.6   GFR: Estimated Creatinine Clearance: 70 mL/min (by C-G formula based on SCr of 0.66 mg/dL). Liver Function Tests: Recent Labs  Lab 07/14/18 1606  AST 12*  ALT 9  ALKPHOS 49  BILITOT 0.6  PROT 6.1*  ALBUMIN 2.7*   No results for input(s): LIPASE, AMYLASE in the last 168 hours. No results for input(s): AMMONIA in the last 168 hours. Coagulation Profile: No results for input(s): INR, PROTIME in the last 168 hours. Cardiac Enzymes: No results for input(s): CKTOTAL, CKMB, CKMBINDEX,  TROPONINI in the last 168 hours. BNP (last 3 results) No results for input(s): PROBNP in the last 8760 hours. HbA1C: No results for input(s): HGBA1C in the last 72 hours. CBG: No results for input(s): GLUCAP in the last 168 hours. Lipid Profile: No results for input(s): CHOL, HDL, LDLCALC, TRIG, CHOLHDL, LDLDIRECT in the last 72 hours. Thyroid Function Tests: No results for input(s): TSH, T4TOTAL, FREET4, T3FREE, THYROIDAB in the last 72 hours. Anemia Panel: No results for input(s): VITAMINB12, FOLATE, FERRITIN, TIBC, IRON, RETICCTPCT in the last 72 hours. Urine analysis:    Component Value  Date/Time   COLORURINE YELLOW 12/06/2014 2207   APPEARANCEUR CLOUDY (A) 12/06/2014 2207   LABSPEC 1.025 12/06/2014 2207   PHURINE 5.5 12/06/2014 2207   GLUCOSEU NEGATIVE 12/06/2014 2207   HGBUR MODERATE (A) 12/06/2014 2207   BILIRUBINUR NEGATIVE 12/06/2014 2207   KETONESUR NEGATIVE 12/06/2014 2207   PROTEINUR NEGATIVE 12/06/2014 2207   UROBILINOGEN 1.0 07/30/2013 1527   NITRITE POSITIVE (A) 12/06/2014 2207   LEUKOCYTESUR SMALL (A) 12/06/2014 2207   Sepsis Labs: @LABRCNTIP (procalcitonin:4,lacticidven:4)  ) Recent Results (from the past 240 hour(s))  SARS Coronavirus 2 (CEPHEID - Performed in [place] hospital lab), Hosp Order     Status: None   Collection Time: 07/07/18  1:40 PM   Specimen: Nasopharyngeal Swab  Result Value Ref Range Status   SARS Coronavirus 2 NEGATIVE NEGATIVE Final    Comment: (NOTE) If result is NEGATIVE SARS-CoV-2 target nucleic acids are NOT DETECTED. The SARS-CoV-2 RNA is generally detectable in upper and lower  respiratory specimens during the acute phase of infection. The lowest  concentration of SARS-CoV-2 viral copies this assay can detect is 250  copies / mL. A negative result does not preclude SARS-CoV-2 infection  and should not be used as the sole basis for treatment or other  patient management decisions.  A negative result may occur with  improper specimen collection / handling, submission of specimen other  than nasopharyngeal swab, presence of viral mutation(s) within the  areas targeted by this assay, and inadequate number of viral copies  (<250 copies / mL). A negative result must be combined with clinical  observations, patient history, and epidemiological information. If result is POSITIVE SARS-CoV-2 target nucleic acids are DETECTED. The SARS-CoV-2 RNA is generally detectable in upper and lower  respiratory specimens dur ing the acute phase of infection.  Positive  results are indicative of active infection with SARS-CoV-2.   Clinical  correlation with patient history and other diagnostic information is  necessary to determine patient infection status.  Positive results do  not rule out bacterial infection or co-infection with other viruses. If result is PRESUMPTIVE POSTIVE SARS-CoV-2 nucleic acids MAY BE PRESENT.   A presumptive positive result was obtained on the submitted specimen  and confirmed on repeat testing.  While 2019 novel coronavirus  (SARS-CoV-2) nucleic acids may be present in the submitted sample  additional confirmatory testing may be necessary for epidemiological  and / or clinical management purposes  to differentiate between  SARS-CoV-2 and other Sarbecovirus currently known to infect humans.  If clinically indicated additional testing with an alternate test  methodology [phone]) is advised. The SARS-CoV-2 RNA is generally  detectable in upper and lower respiratory sp ecimens during the acute  phase of infection. The expected result is Negative. Fact Sheet for Patients:  StrictlyIdeas.no Fact Sheet for Healthcare Providers: BankingDealers.co.za This test is not yet approved or cleared by the [place] FDA and has been authorized for detection  and/or diagnosis of SARS-CoV-2 by FDA under an Emergency Use Authorization (EUA).  This EUA will remain in effect (meaning this test can be used) for the duration of the COVID-19 declaration under Section 564(b)(1) of the Act, 21 U.S.C. section 360bbb-3(b)(1), unless the authorization is terminated or revoked sooner. Performed at [facility], [place] [address], [place], [place] [address]   Blood culture (routine x 2)     Status: None (Preliminary result)   Collection Time: 07/14/18  5:30 PM   Specimen: BLOOD  Result Value Ref Range Status   Specimen Description   Final    BLOOD PORTA CATH Performed at [facility] [address], [place], [place] [address]     Special Requests   Final    BOTTLES DRAWN AEROBIC AND ANAEROBIC Blood Culture adequate volume Performed at [facility] [address], [place], [place] [address]    Culture   Final    NO GROWTH 2 DAYS Performed at [facility] [address] [place], [place] [address]    Report Status PENDING  Incomplete  Blood culture (routine x 2)     Status: None (Preliminary result)   Collection Time: 07/14/18  5:40 PM   Specimen: Site Not Specified; Blood  Result Value Ref Range Status   Specimen Description   Final    SITE NOT SPECIFIED Performed at [facility], [place] [address] [place], [place] [address]    Special Requests   Final    BOTTLES DRAWN AEROBIC AND ANAEROBIC Blood Culture adequate volume Performed at [facility] [address], [place], [place] [address]    Culture   Final    NO GROWTH 2 DAYS Performed at [facility] [address], [place], [place] [address]    Report Status PENDING  Incomplete  SARS Coronavirus 2 (CEPHEID - Performed in [place] hospital lab), Hosp Order     Status: None   Collection Time: 07/14/18  6:16 PM   Specimen: Nasopharyngeal Swab  Result Value Ref Range Status   SARS Coronavirus 2 NEGATIVE NEGATIVE Final    Comment: (NOTE) If result is NEGATIVE SARS-CoV-2 target nucleic acids are NOT DETECTED. The SARS-CoV-2 RNA is generally detectable in upper and lower  respiratory specimens during the acute phase of infection. The lowest  concentration of SARS-CoV-2 viral copies this assay can detect is 250  copies / mL. A negative result does not preclude SARS-CoV-2 infection  and should not be used as the sole basis for treatment or other  patient management decisions.  A negative result may occur with  improper specimen collection / handling, submission of specimen other  than nasopharyngeal swab, presence of viral mutation(s) within the  areas targeted by this assay, and inadequate  number of viral copies  (<250 copies / mL). A negative result must be combined with clinical  observations, patient history, and epidemiological information. If result is POSITIVE SARS-CoV-2 target nucleic acids are DETECTED. The SARS-CoV-2 RNA is generally detectable in upper and lower  respiratory specimens dur ing the acute phase of infection.  Positive  results are indicative of active infection with SARS-CoV-2.  Clinical  correlation with patient history and other diagnostic information is  necessary to determine patient infection status.  Positive results do  not rule out bacterial infection or co-infection with other viruses. If result is PRESUMPTIVE POSTIVE SARS-CoV-2 nucleic acids MAY BE PRESENT.   A presumptive positive result was obtained  on the submitted specimen  and confirmed on repeat testing.  While 2019 novel coronavirus  (SARS-CoV-2) nucleic acids may be present in the submitted sample  additional confirmatory testing may be necessary for epidemiological  and / or clinical management purposes  to differentiate between  SARS-CoV-2 and other Sarbecovirus currently known to infect humans.  If clinically indicated additional testing with an alternate test  methodology [phone]) is advised. The SARS-CoV-2 RNA is generally  detectable in upper and lower respiratory sp ecimens during the acute  phase of infection. The expected result is Negative. Fact Sheet for Patients:  StrictlyIdeas.no Fact Sheet for Healthcare Providers: BankingDealers.co.za This test is not yet approved or cleared by the [place] FDA and has been authorized for detection and/or diagnosis of SARS-CoV-2 by FDA under an Emergency Use Authorization (EUA).  This EUA will remain in effect (meaning this test can be used) for the duration of the COVID-19 declaration under Section 564(b)(1) of the Act, 21 U.S.C. section 360bbb-3(b)(1), unless the  authorization is terminated or revoked sooner. Performed at [facility], [place] [address] [place], [place] [address]       Studies: No results found.  Scheduled Meds: . amLODipine  5 mg Oral Daily  . aspirin EC  81 mg Oral Daily  . benzonatate  200 mg Oral TID  . enoxaparin (LOVENOX) injection  40 mg Subcutaneous Q24H  . feeding supplement  1 Container Oral TID BM  . feeding supplement (PRO-STAT SUGAR FREE 64)  30 mL Oral BID  . gabapentin  400 mg Oral TID  . ipratropium  0.5 mg Nebulization BID  . levalbuterol  0.63 mg Nebulization BID  . metoprolol tartrate  25 mg Oral BID  . multivitamin with minerals  1 tablet Oral Daily  . nicotine  21 mg Transdermal Q24H  . PARoxetine  20 mg Oral BID  . senna-docusate  2 tablet Oral BID  . Tbo-filgastrim (GRANIX) SQ  480 mcg Subcutaneous q1800    Continuous Infusions: . ceFEPime (MAXIPIME) IV 2 g (07/17/18 1113)     LOS: 3 days     Kayleen Memos, MD [facility] Pager [phone]  If 7PM-7AM, please contact night-coverage [email or web address] Password [facility] 07/17/2018, 11:20 AM

## 2018-07-18 DIAGNOSIS — Z171 Estrogen receptor negative status [ER-]: Secondary | ICD-10-CM

## 2018-07-18 DIAGNOSIS — F419 Anxiety disorder, unspecified: Secondary | ICD-10-CM

## 2018-07-18 DIAGNOSIS — T451X5A Adverse effect of antineoplastic and immunosuppressive drugs, initial encounter: Secondary | ICD-10-CM

## 2018-07-18 DIAGNOSIS — I10 Essential (primary) hypertension: Secondary | ICD-10-CM

## 2018-07-18 DIAGNOSIS — Z720 Tobacco use: Secondary | ICD-10-CM

## 2018-07-18 DIAGNOSIS — J449 Chronic obstructive pulmonary disease, unspecified: Secondary | ICD-10-CM

## 2018-07-18 DIAGNOSIS — D701 Agranulocytosis secondary to cancer chemotherapy: Secondary | ICD-10-CM

## 2018-07-18 DIAGNOSIS — D6481 Anemia due to antineoplastic chemotherapy: Secondary | ICD-10-CM

## 2018-07-18 DIAGNOSIS — C50412 Malignant neoplasm of upper-outer quadrant of left female breast: Secondary | ICD-10-CM

## 2018-07-18 DIAGNOSIS — F329 Major depressive disorder, single episode, unspecified: Secondary | ICD-10-CM

## 2018-07-18 DIAGNOSIS — J910 Malignant pleural effusion: Secondary | ICD-10-CM

## 2018-07-18 MED ORDER — HEPARIN SOD (PORK) LOCK FLUSH 100 UNIT/ML IV SOLN
500.0000 [IU] | INTRAVENOUS | Status: AC | PRN
  Administered 2018-07-18: 500 [IU]

## 2018-07-18 MED ORDER — CEFUROXIME AXETIL 500 MG PO TABS: 500.0000 mg | ORAL_TABLET | Freq: Two times a day (BID) | ORAL | 0 refills | Status: AC

## 2018-07-18 NOTE — Care Management Important Message (Signed)
Important Message  Patient Details IM Letter given to Dessa Phi RN to present to the Patient Name: Deborah Mcintyre MRN: [id number] Date of Birth: Oct 04, 1955   Medicare Important Message Given:  Yes     Kerin Salen 07/18/2018, 12:04 PM

## 2018-07-18 NOTE — Progress Notes (Signed)
Patient is stable for discharge. Discharge instructions and medications have been reviewed with the patient and all questions answered. AVS and prescriptions given to patient. Patient sent with a box of Pleurx Catheter drains.

## 2018-07-18 NOTE — TOC Transition Note (Signed)
Transition of Care [facility]) - CM/SW Discharge Note   Patient Details  Name: Deborah Mcintyre MRN: [id number] Date of Birth: 12-17-1955  [facility]) CM/SW Contact:  Dessa Phi, RN Phone Number: 07/18/2018, 11:19 AM   Clinical Narrative: d/c home today w/HHRN-pleurx cath instruction/drainage [facility] aware of d/c;CM ordered w/confirmation from [facility] fax order for ongoing pleurx cath @ home.Patient to receive a box of 10 pleurx cannisters from the floor to take home @ d/c-Nsg aware. Patient has safe home,dtr support,pharmacy,transportation. No further CM needs.      Final next level of care: [place] Barriers to Discharge: No Barriers Identified   Patient Goals and CMS Choice Patient states their goals for this hospitalization and ongoing recovery are:: home CMS Medicare.gov Compare Post Acute Care list provided to:: Patient Choice offered to / list presented to : Patient  Discharge Placement                       Discharge Plan and Services   Discharge Planning Services: CM Consult            DME Arranged: Chest tube pluerex DME Agency: Other - Comment([facility]) Date DME Agency Contacted: 07/18/18 Time DME Agency Contacted: [phone] Representative spoke with at DME Agency: Initial set up from the hospital-10 cannisters;for ongoing pleurx cannisters HH Arranged: RN [place] Agency: [place] Date [place]: 07/18/18 Time [place]: 1119 Representative spoke with at [place]: [place] ([place]) Interventions     Readmission Risk Interventions Readmission Risk Prevention Plan 07/15/2018  Transportation Screening Complete  Medication Review Press photographer) Complete  PCP or Specialist appointment within 3-5 days of discharge Complete  HRI or [place] Complete  SW Recovery Care/Counseling Consult Complete  [place] Not Applicable  Some recent data might be hidden

## 2018-07-18 NOTE — Progress Notes (Signed)
RN attempted to drain pleurx catheter per MD verbal order, prior to discharge. Connected catheter to drainage container appropriately and only a few drops of fluid came out. Manipulated tube and canister to gravity and did for about 5 minutes with no drainage. Patient denies chest discomfort and SOB. Removed drain and placed dressing. Patient with a box of Pleurx drains at bedside to take home at discharge today.   Deborah Mcintyre, [name] 07/18/2018 12:00 PM

## 2018-07-18 NOTE — TOC Progression Note (Signed)
Transition of Care [facility]) - Progression Note    Patient Details  Name: Deborah Mcintyre MRN: [id number] Date of Birth: 1956-01-04  Transition of [facility]) CM/SW Contact  Kaspian Muccio, Juliann Pulse, RN Phone Number: 07/18/2018, 2:44 PM  Clinical Narrative:  Noted patient has pleurx cannisters to take home. Per nsg she is trying to reach family for transport home,but unable to find key. For CM to asst w/transport by taxi-concern is ability of patient to safely carry pleurx cannisters into home. Patient is trying to find family/friend transport.No further CM needs.     Expected Discharge Plan: Home/Self Care Barriers to Discharge: No Barriers Identified  Expected Discharge Plan and Services Expected Discharge Plan: Home/Self Care   Discharge Planning Services: CM Consult   Living arrangements for the past 2 months: Single Family Home Expected Discharge Date: 07/18/18               DME Arranged: Chest tube pluerex DME Agency: Other - Comment([facility]) Date DME Agency Contacted: 07/18/18 Time DME Agency Contacted: [phone] Representative spoke with at DME Agency: Initial set up from the hospital-10 cannisters;for ongoing pleurx cannisters HH Arranged: RN [place] Agency: [place] Date [place]: 07/18/18 Time [place]: [phone] Representative spoke with at [place]: [place] ([place]) Interventions    Readmission Risk Interventions Readmission Risk Prevention Plan 07/15/2018  Transportation Screening Complete  Medication Review Press photographer) Complete  PCP or Specialist appointment within 3-5 days of discharge Complete  HRI or [place] Complete  SW Recovery Care/Counseling Consult Complete  [place] Not Applicable  Some recent data might be hidden

## 2018-07-18 NOTE — Discharge Summary (Signed)
Physician Discharge Summary  Deborah Mcintyre WIO:[id number] DOB: August 19, 1955 DOA: 07/14/2018  PCP: [name], MD  Admit date: 07/14/2018 Discharge date: 07/18/2018  Admitted From: home  Disposition:  home       Discharge Condition:  stable   CODE STATUS:  Full code   Diet recommendation:  Regular diet Consultations:  Oncology  IR    Discharge Diagnoses:  Principal Problem:   HCAP (healthcare-associated pneumonia) Active Problems:   Breast cancer of upper-outer quadrant of left female breast ([facility])   Antineoplastic chemotherapy induced anemia   Antineoplastic chemotherapy induced pancytopenia ([facility])   Malignant pleural effusion   Hypertension   COPD (chronic obstructive pulmonary disease) ([facility])   Anxiety and depression   Tobacco abuse       Brief Summary: cancer status post mastectomy with ongoing chemotherapy, chronic anxiety and depression, hypertension, recurrent malignant left pleural effusion status post Pleurx catheter placement in May 2020 by interventional radiology, COPD on 2 L of oxygen via nasal cannula as needed, ongoing tobacco use who [facility] of worsening shortness of breath, cough and pleuritic left chest pain. Patient reports in the last 2 days her left chest pain has becomeincreasingly worse to the point where she finds it difficult to take a breath. States her pleural catheter has not been draining well.   ED Course:Upon presentation to the ED chest x-ray revealed recurrent left pleural effusion with possible left lower lobe infiltrates, lab studies remarkable for significant leukopenia with WBC 1.5 and neutropenia with neutrophil 0.4.She was started on IV antibiotics empirically due to suspicion for HCAP.  Hospital Course:  HCAP - in setting of immune compromised state and neutropenia from chemo - she has improved with IV antibiotics and has been transitioned to oral antibiotics - pleuritic pain has resolved - she is  not hypoxic and is stable for discharge today.   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 with axillary lymph node dissection, ongoing chemotherapy - appreciate oncology f/u in hospital - on palliative chemo with [place] started on 6/4 by Dr Lindi Adie -  she will f/u as outpt for ongoing chemotherapy  Chronic respiratory failure / COPD/ nicotine abuse - cont 2 L O2 on which she is stable - counseled on nicotine abuse - cont inhalers and Nebs  Thrush -cont Nystatin- improving  Essential HTN - cont Amlodipine & Lopressor  Anxiety - cont Xanax TID PRN   Discharge Exam: Vitals:   07/17/18 2111 07/18/18 0551  BP: 111/80 122/73  Pulse: 99 96  Resp: 17 18  Temp: 98.9 F (37.2 C) 98.8 F (37.1 C)  SpO2: 94% 96%   Vitals:   07/17/18 1302 07/17/18 2009 07/17/18 2111 07/18/18 0551  BP: 111/81  111/80 122/73  Pulse: 96  99 96  Resp: 14  17 18   Temp: 98.2 F (36.8 C)  98.9 F (37.2 C) 98.8 F (37.1 C)  TempSrc: Oral  Oral   SpO2: 97% 92% 94% 96%  Weight:      Height:        General: Pt is alert, awake, not in acute distress Cardiovascular: RRR, S1/S2 +, no rubs, no gallops Respiratory: CTA bilaterally,  no wheezing, no rhonchi Abdominal: Soft, NT, ND, bowel sounds + Extremities: no edema, no cyanosis   Discharge Instructions  Discharge Instructions    Diet - low sodium heart healthy   Complete by: As directed    Increase activity slowly   Complete by: As directed      Allergies as of 07/18/2018      Reactions   Fentanyl Nausea And Vomiting, Other (See Comments)   Headache   Morphine And Related Nausea And  Vomiting, Other (See Comments)   Headache      Medication List    STOP taking these medications   zolpidem 6.25 MG CR tablet Commonly known as: Ambien CR     TAKE these medications   albuterol 108 (90 Base) MCG/ACT inhaler Commonly known as: VENTOLIN HFA Inhale 2 puffs into the lungs every 6 (six) hours as needed for wheezing or shortness of breath.   albuterol (2.5 MG/3ML) 0.083% nebulizer solution Commonly known as: PROVENTIL Take 3 mLs (2.5 mg total) by nebulization every 6 (six) hours as needed for up to 30 days for wheezing or shortness of breath.   ALPRAZolam 0.5 MG tablet Commonly known as: XANAX Take 1 tablet (0.5 mg total) by mouth 3 (three) times daily as needed for anxiety or sleep.   amLODipine 5 MG tablet Commonly known as: NORVASC Take 1 tablet (5 mg total) by mouth daily.   aspirin EC 81 MG tablet Take 81 mg by mouth daily.   benzonatate 200 MG capsule Commonly known as: TESSALON Take 1 capsule (200 mg total) by mouth 2 (two) times daily as needed for cough.   cefUROXime 500 MG tablet Commonly known as: CEFTIN Take 1 tablet (500 mg total) by mouth 2 (two) times daily for 5 days.   dexamethasone 1 MG tablet Commonly known as: DECADRON Take 1 tablet (1 mg total) by mouth daily.   gabapentin 400 MG capsule Commonly known as: NEURONTIN Take 1 capsule (400 mg total) by mouth 3 (three) times daily. What changed:   medication strength  how much to take   lidocaine-prilocaine cream Commonly known as: EMLA Apply to affected area once What changed:   how much to take  how to take this  when to take this  reasons to take this  additional instructions   metoprolol tartrate 25 MG tablet Commonly known as: LOPRESSOR Take 1 tablet (25 mg total) by mouth 2 (two) times daily.   multivitamin with minerals Tabs tablet Take 1 tablet by mouth daily.   nicotine 21 mg/24hr patch Commonly known as: NICODERM CQ - dosed in mg/24 hours Place 1 patch (21  mg total) onto the skin daily.   nystatin 100000 UNIT/ML suspension Commonly known as: MYCOSTATIN Use as directed 5 mLs (500,000 Units total) in the mouth or throat 4 (four) times daily.   ondansetron 8 MG tablet Commonly known as: ZOFRAN Take 1 tablet (8 mg total) by mouth every 8 (eight) hours as needed for nausea.   oxyCODONE-acetaminophen 5-325 MG tablet Commonly known as: PERCOCET/ROXICET Take 1 tablet by mouth 3 (three) times daily as needed for moderate pain or severe pain (1 tab every 4-6 hour prn pain). What changed: when to take this   PARoxetine 20 MG tablet Commonly known as: PAXIL Take 1 tablet (20 mg total) by mouth 2 (two) times daily.   Premarin vaginal cream Generic drug: conjugated estrogens INSERT 1 APPLICATORFUL VAGINALLY DAILY What changed: See the new instructions.   prochlorperazine 10 MG tablet Commonly  known as: COMPAZINE Take 1 tablet (10 mg total) by mouth every 6 (six) hours as needed (Nausea or vomiting). What changed: reasons to take this   senna-docusate 8.6-50 MG tablet Commonly known as: Senokot-S Take 2 tablets by mouth 2 (two) times daily.      Follow-up Information    [name], MD. Call in 1 day(s).   Specialty: Cardiology Why: Please call for a post hospital follow up appointment. Contact information: [address] [place] [address] [phone]        Nicholas Lose, MD. Call in 1 day(s).   Specialty: Hematology and Oncology Why: please call for a post hospital follow up appointment. Contact information: [place] [address] [phone]        Sandi Mariscal, MD. Call in 1 day(s).   Specialties: Interventional Radiology, Radiology Contact information: [address] [place] [address] [phone]          Allergies  Allergen Reactions  . Fentanyl Nausea And Vomiting and Other (See Comments)    Headache  . Morphine And Related Nausea And Vomiting and  Other (See Comments)    Headache     Procedures/Studies:    Dg Chest 2 View  Result Date: 07/14/2018 CLINICAL DATA:  63 year old with current history of stage IV LEFT lung cancer and chronic malignant LEFT pleural effusion with a PleurX catheter in place. Patient presents stating that the PleurX catheter is no longer draining and complains of acute shortness of breath. EXAM: CHEST - 2 VIEW COMPARISON:  CT chest 06/30/2018 and earlier. Chest x-ray 06/30/2018. FINDINGS: Moderate-sized LEFT pleural effusion, localized to the base and mid LEFT hemithorax. The PleurX catheter is positioned posteriorly and medially in the LEFT hemithorax. Associated consolidation in the LEFT LOWER LOBE and lingula. Small nodule involving the RIGHT mid lung as noted on the recent CT. RIGHT lung otherwise clear. No RIGHT pleural effusion. Cardiac silhouette normal in size, unchanged. Thoracic aorta mildly atherosclerotic, unchanged. RIGHT jugular Port-A-Cath tip in the LOWER SVC. Degenerative changes involving thoracic spine. IMPRESSION: 1. Moderate-sized LEFT pleural effusion localized to the base and mid LEFT hemithorax. Associated passive atelectasis and/or pneumonia involving the LEFT LOWER LOBE and lingula. 2. The PleurX catheter is positioned posteriorly and medially in the LEFT hemithorax. 3. Small nodule involving the RIGHT mid lung indicating metastasis, as noted on the recent CT. Electronically Signed   By: Evangeline Dakin M.D.   On: 07/14/2018 16:16   Ct Chest Wo Contrast  Result Date: 06/30/2018 CLINICAL DATA:  Increasing shortness of breath. History of metastatic lung cancer. EXAM: CT CHEST WITHOUT CONTRAST TECHNIQUE: Multidetector CT imaging of the chest was performed following the standard protocol without IV contrast. COMPARISON:  PET-CT dated June 15, 2018. CT chest dated Jun 01, 2018. FINDINGS: Cardiovascular: Normal heart size. No pericardial effusion. Unchanged ascending thoracic aortic aneurysm measuring  up to 4.1 cm. Atherosclerotic calcification of the aortic arch and branch vessels. Unchanged right chest wall port catheter. Mediastinum/Nodes: Unchanged mediastinal lymphadenopathy. Index precarinal lymph node measures 2.1 cm, previously 2.1 cm. Limited evaluation known hilar lymphadenopathy due to lack of intravenous contrast. The thyroid gland, trachea, and esophagus demonstrate no significant findings. Lungs/Pleura: The large mass in the anterior left upper lobe is grossly unchanged. Enlarging loculated left pleural effusion with increasing partial collapse of the left lower lobe. Left pleural drainage catheter again noted. Scattered small foci of air in the left pleural space are likely related to the drainage catheter. Previously  seen ill-defined nodular opacities in the peripheral right upper and lower lobes are slightly more dense when compared to recent PET. Upper Abdomen: No acute abnormality. Musculoskeletal: No chest wall mass or suspicious bone lesions identified. Prior left mastectomy. Unchanged posterior midline sebaceous cyst. IMPRESSION: 1. Enlarging loculated left pleural effusion with increasing partial collapse of the left lower lobe. 2. Grossly unchanged large left upper lobe mass and nodal metastases. 3. Previously seen ill-defined nodular opacities in the peripheral right upper and lower lobes are slightly more dense when compared to recent PET-CT, and remain concerning for contralateral pulmonary metastases. 4. Unchanged ascending thoracic aortic aneurysm measuring 4.1 cm. Recommend annual imaging followup by CTA or MRA. This recommendation follows 2010 ACCF/AHA/AATS/ACR/ASA/SCA/SCAI/SIR/STS/SVM Guidelines for the Diagnosis and Management of Patients with Thoracic Aortic Disease. Circulation. 2010; 121: G626-R485. Aortic aneurysm NOS (ICD10-I71.9) 5.  Aortic atherosclerosis (ICD10-I70.0). Electronically Signed   By: Titus Dubin M.D.   On: 06/30/2018 19:49   Dg Chest Port 1  View  Result Date: 06/30/2018 CLINICAL DATA:  Increased shortness of breath with decreased drainage from the PleurX catheter. History of metastatic lung cancer. EXAM: PORTABLE CHEST 1 VIEW COMPARISON:  PET-CT dated June 15, 2018. Chest x-ray dated Jun 03, 2018. FINDINGS: Unchanged right chest wall port catheter. Unchanged left PleurX catheter. Increasing size of the now large loculated left pleural effusion with likely complete collapse of the left lower lobe and partial collapse of the left upper lobe. The right lung is clear. No pneumothorax. Stable cardiomediastinal silhouette. No acute osseous abnormality. IMPRESSION: 1. Increasing now large loculated left pleural effusion with worsening aeration in the left lung. Electronically Signed   By: Titus Dubin M.D.   On: 06/30/2018 12:44   Ir Thoracentesis Asp Pleural Space W/img Guide  Result Date: 07/07/2018 INDICATION: History of metastatic breast cancer with recurrent symptomatic left-sided pleural effusion post image guided placement of a tunneled pleural drainage catheter on 06/02/2018 Patient had recent episode of pleural drainage catheter malfunction which resolved following the administration of a tPA dwell. Unfortunately, patient reports persistent difficulties with aspiration from the left-sided tunneled pleural catheter and as such presents to the Interventional Radiology Department for evaluation and management. Note, chest CT performed 06/30/2018 demonstrated appropriate positioning of the left-sided tunneled pleural drainage catheter though note is made of suspected loculations involving the left-sided pleural fluid. EXAM: IR THORACENTESIS ASP PLEURAL SPACE W/IMG GUIDE COMPARISON:  Image guided left-sided tunneled pleural drainage catheter placement-06/02/2018; chest CT-06/30/2018 MEDICATIONS: None ANESTHESIA/SEDATION: None CONTRAST:  None COMPLICATIONS: None immediate. PROCEDURE: Prior to more invasive measures, the patient's existing tunneled  pleural drainage catheter was flushed with approximately 5 cc of saline and then connected to a standard pleura vac suction device yielding the brisk return of approximately 700 cc of slightly blood-tinged pleural fluid. Additional fluid was not aspirated at this time secondary to patient experiencing a symptomatic cough. IMPRESSION: Successful aspiration of 700 cc of slightly blood-tinged pleural fluid from the appropriately functioning left-sided pleural drainage catheter. Electronically Signed   By: Sandi Mariscal M.D.   On: 07/07/2018 17:39     The results of significant diagnostics from this hospitalization (including imaging, microbiology, ancillary and laboratory) are listed below for reference.     Microbiology: Recent Results (from the past 240 hour(s))  Blood culture (routine x 2)     Status: None (Preliminary result)   Collection Time: 07/14/18  5:30 PM   Specimen: BLOOD  Result Value Ref Range Status   Specimen Description   Final  BLOOD PORTA CATH Performed at [facility], [place] [address] [place], [place] [address]    Special Requests   Final    BOTTLES DRAWN AEROBIC AND ANAEROBIC Blood Culture adequate volume Performed at [facility] [address] [place], [place] [address]    Culture   Final    NO GROWTH 3 DAYS Performed at [facility], [place] [address], [place], [place] [address]    Report Status PENDING  Incomplete  Blood culture (routine x 2)     Status: None (Preliminary result)   Collection Time: 07/14/18  5:40 PM   Specimen: Site Not Specified; Blood  Result Value Ref Range Status   Specimen Description   Final    SITE NOT SPECIFIED Performed at [facility], [place] [address], [place], [place] [address]    Special Requests   Final    BOTTLES DRAWN AEROBIC AND ANAEROBIC Blood Culture adequate volume Performed at [facility] [address] [place], [place] [address]    Culture   Final     NO GROWTH 3 DAYS Performed at [facility], [place] [address], [place], [place] [address]    Report Status PENDING  Incomplete  SARS Coronavirus 2 (CEPHEID - Performed in [facility] hospital lab), Hosp Order     Status: None   Collection Time: 07/14/18  6:16 PM   Specimen: Nasopharyngeal Swab  Result Value Ref Range Status   SARS Coronavirus 2 NEGATIVE NEGATIVE Final    Comment: (NOTE) If result is NEGATIVE SARS-CoV-2 target nucleic acids are NOT DETECTED. The SARS-CoV-2 RNA is generally detectable in upper and lower  respiratory specimens during the acute phase of infection. The lowest  concentration of SARS-CoV-2 viral copies this assay can detect is 250  copies / mL. A negative result does not preclude SARS-CoV-2 infection  and should not be used as the sole basis for treatment or other  patient management decisions.  A negative result may occur with  improper specimen collection / handling, submission of specimen other  than nasopharyngeal swab, presence of viral mutation(s) within the  areas targeted by this assay, and inadequate number of viral copies  (<250 copies / mL). A negative result must be combined with clinical  observations, patient history, and epidemiological information. If result is POSITIVE SARS-CoV-2 target nucleic acids are DETECTED. The SARS-CoV-2 RNA is generally detectable in upper and lower  respiratory specimens dur ing the acute phase of infection.  Positive  results are indicative of active infection with SARS-CoV-2.  Clinical  correlation with patient history and other diagnostic information is  necessary to determine patient infection status.  Positive results do  not rule out bacterial infection or co-infection with other viruses. If result is PRESUMPTIVE POSTIVE SARS-CoV-2 nucleic acids MAY BE PRESENT.   A presumptive positive result was obtained on the submitted specimen  and confirmed on repeat testing.  While 2019 novel coronavirus   (SARS-CoV-2) nucleic acids may be present in the submitted sample  additional confirmatory testing may be necessary for epidemiological  and / or clinical management purposes  to differentiate between  SARS-CoV-2 and other Sarbecovirus currently known to infect humans.  If clinically indicated additional testing with an alternate test  methodology [phone]) is advised. The SARS-CoV-2 RNA is generally  detectable in upper and lower respiratory sp ecimens during the acute  phase of infection. The expected result is Negative. Fact Sheet for Patients:  StrictlyIdeas.no Fact Sheet for  Healthcare Providers: BankingDealers.co.za This test is not yet approved or cleared by the [place] and has been authorized for detection and/or diagnosis of SARS-CoV-2 by FDA under an Emergency Use Authorization (EUA).  This EUA will remain in effect (meaning this test can be used) for the duration of the COVID-19 declaration under Section 564(b)(1) of the Act, 21 U.S.C. section 360bbb-3(b)(1), unless the authorization is terminated or revoked sooner. Performed at [facility], [place] [address] [place], [place] [address]      Labs: BNP (last 3 results) No results for input(s): BNP in the last 8760 hours. Basic Metabolic Panel: Recent Labs  Lab 07/14/18 1606 07/15/18 0500  NA 140 141  K 3.2* 3.7  CL 98 103  CO2 28 27  GLUCOSE 89 97  BUN 9 8  CREATININE 0.70 0.66  CALCIUM 8.5* 8.2*  MG  --  2.2  PHOS  --  2.6   Liver Function Tests: Recent Labs  Lab 07/14/18 1606  AST 12*  ALT 9  ALKPHOS 49  BILITOT 0.6  PROT 6.1*  ALBUMIN 2.7*   No results for input(s): LIPASE, AMYLASE in the last 168 hours. No results for input(s): AMMONIA in the last 168 hours. CBC: Recent Labs  Lab 07/14/18 1606 07/15/18 0500 07/16/18 0751 07/17/18 0357  WBC 1.5* 1.4* 2.0* 3.7*  NEUTROABS 0.4* 0.3* 0.3* 1.5*  HGB 10.4* 8.9* 9.3*  9.3*  HCT 34.4* 30.6* 32.0* 31.6*  MCV 92.5 93.6 93.6 94.6  PLT 369 317 363 339   Cardiac Enzymes: No results for input(s): CKTOTAL, CKMB, CKMBINDEX, TROPONINI in the last 168 hours. BNP: Invalid input(s): POCBNP CBG: No results for input(s): GLUCAP in the last 168 hours. D-Dimer No results for input(s): DDIMER in the last 72 hours. Hgb A1c No results for input(s): HGBA1C in the last 72 hours. Lipid Profile No results for input(s): CHOL, HDL, LDLCALC, TRIG, CHOLHDL, LDLDIRECT in the last 72 hours. Thyroid function studies No results for input(s): TSH, T4TOTAL, T3FREE, THYROIDAB in the last 72 hours.  Invalid input(s): FREET3 Anemia work up No results for input(s): VITAMINB12, FOLATE, FERRITIN, TIBC, IRON, RETICCTPCT in the last 72 hours. Urinalysis    Component Value Date/Time   COLORURINE YELLOW 12/06/2014 2207   APPEARANCEUR CLOUDY (A) 12/06/2014 2207   LABSPEC 1.025 12/06/2014 2207   PHURINE 5.5 12/06/2014 2207   GLUCOSEU NEGATIVE 12/06/2014 2207   HGBUR MODERATE (A) 12/06/2014 2207   BILIRUBINUR NEGATIVE 12/06/2014 2207   KETONESUR NEGATIVE 12/06/2014 2207   PROTEINUR NEGATIVE 12/06/2014 2207   UROBILINOGEN 1.0 07/30/2013 1527   NITRITE POSITIVE (A) 12/06/2014 2207   LEUKOCYTESUR SMALL (A) 12/06/2014 2207   Sepsis Labs Invalid input(s): PROCALCITONIN,  WBC,  LACTICIDVEN Microbiology Recent Results (from the past 240 hour(s))  Blood culture (routine x 2)     Status: None (Preliminary result)   Collection Time: 07/14/18  5:30 PM   Specimen: BLOOD  Result Value Ref Range Status   Specimen Description   Final    BLOOD PORTA CATH Performed at [facility], [place] [address] [place], [place] [address]    Special Requests   Final    BOTTLES DRAWN AEROBIC AND ANAEROBIC Blood Culture adequate volume Performed at [facility] [address] [place], [place] [address]    Culture   Final    NO GROWTH 3 DAYS Performed at [facility], [place] [address], [place], [place] [address]    Report Status PENDING  Incomplete  Blood culture (routine x 2)     Status: None (Preliminary result)   Collection Time: 07/14/18  5:40 PM   Specimen: Site Not Specified; Blood  Result Value Ref Range Status   Specimen Description   Final    SITE NOT SPECIFIED Performed at [facility], [place] [address], [place], [place] [address]    Special Requests   Final    BOTTLES DRAWN AEROBIC AND ANAEROBIC Blood Culture adequate volume Performed at [place] [address], [place], [place] [address]    Culture   Final    NO GROWTH 3 DAYS Performed at [facility], [place] [address], [place], [place] [address]    Report Status PENDING  Incomplete  SARS Coronavirus 2 (CEPHEID - Performed in [place] hospital lab), Hosp Order     Status: None   Collection Time: 07/14/18  6:16 PM   Specimen: Nasopharyngeal Swab  Result Value Ref Range Status   SARS Coronavirus 2 NEGATIVE NEGATIVE Final    Comment: (NOTE) If result is NEGATIVE SARS-CoV-2 target nucleic acids are NOT DETECTED. The SARS-CoV-2 RNA is generally detectable in upper and lower  respiratory specimens during the acute phase of infection. The lowest  concentration of SARS-CoV-2 viral copies this assay can detect is 250  copies / mL. A negative result does not preclude SARS-CoV-2 infection  and should not be used as the sole basis for treatment or other  patient management decisions.  A negative result may occur with  improper specimen collection / handling, submission of specimen other  than nasopharyngeal swab, presence of viral mutation(s) within the  areas targeted by this assay, and inadequate number of viral copies  (<250 copies / mL). A negative result must be combined with clinical  observations, patient history, and epidemiological information. If result is POSITIVE SARS-CoV-2 target nucleic acids are DETECTED. The SARS-CoV-2 RNA is  generally detectable in upper and lower  respiratory specimens dur ing the acute phase of infection.  Positive  results are indicative of active infection with SARS-CoV-2.  Clinical  correlation with patient history and other diagnostic information is  necessary to determine patient infection status.  Positive results do  not rule out bacterial infection or co-infection with other viruses. If result is PRESUMPTIVE POSTIVE SARS-CoV-2 nucleic acids MAY BE PRESENT.   A presumptive positive result was obtained on the submitted specimen  and confirmed on repeat testing.  While 2019 novel coronavirus  (SARS-CoV-2) nucleic acids may be present in the submitted sample  additional confirmatory testing may be necessary for epidemiological  and / or clinical management purposes  to differentiate between  SARS-CoV-2 and other Sarbecovirus currently known to infect humans.  If clinically indicated additional testing with an alternate test  methodology [phone]) is advised. The SARS-CoV-2 RNA is generally  detectable in upper and lower respiratory sp ecimens during the acute  phase of infection. The expected result is Negative. Fact Sheet for Patients:  StrictlyIdeas.no Fact Sheet for Healthcare Providers: BankingDealers.co.za This test is not yet approved or cleared by the [place] FDA and has been authorized for detection and/or diagnosis of SARS-CoV-2 by FDA under an Emergency Use Authorization (EUA).  This EUA will remain in effect (meaning this test can be used) for the duration of the COVID-19 declaration under Section 564(b)(1) of the Act, 21 U.S.C. section 360bbb-3(b)(1), unless the authorization is terminated or revoked sooner. Performed at [facility], [address] [place], [place]  [address]      Time coordinating discharge in minutes: 65  SIGNED:   Debbe Odea, MD  [facility] 07/18/2018, 9:22  AM Pager   If 7PM-7AM, please contact night-coverage [email or web address] Password [email or web address]

## 2018-07-19 ENCOUNTER — Other Ambulatory Visit: Payer: Self-pay | Admitting: Hematology and Oncology

## 2018-07-19 ENCOUNTER — Other Ambulatory Visit: Payer: Self-pay | Admitting: *Deleted

## 2018-07-19 DIAGNOSIS — C50412 Malignant neoplasm of upper-outer quadrant of left female breast: Secondary | ICD-10-CM

## 2018-07-19 DIAGNOSIS — Z171 Estrogen receptor negative status [ER-]: Secondary | ICD-10-CM

## 2018-07-19 LAB — CULTURE, BLOOD (ROUTINE X 2)
Culture: NO GROWTH
Culture: NO GROWTH
Special Requests: ADEQUATE
Special Requests: ADEQUATE

## 2018-07-19 MED ORDER — NICOTINE 21 MG/24HR TD PT24: 21.0000 mg | MEDICATED_PATCH | TRANSDERMAL | 0 refills | Status: DC

## 2018-07-19 MED ORDER — PROMETHAZINE HCL 12.5 MG PO TABS: 12.5000 mg | ORAL_TABLET | Freq: Four times a day (QID) | ORAL | 0 refills | Status: DC | PRN

## 2018-07-19 MED ORDER — OXYCODONE-ACETAMINOPHEN 5-325 MG PO TABS: 1.0000 | ORAL_TABLET | Freq: Three times a day (TID) | ORAL | 0 refills | Status: DC | PRN

## 2018-07-19 NOTE — Progress Notes (Signed)
Received call from [facility] requesting DME for shower chair and bed side commode.  [facility] sent to [facility] to supply DME.  Also per RN, pt nausea not well controlled with Zofran and compazine p.o.  Per Dr. Lindi Adie, pt to receive 12.5 mg p.o phenergan every 6 hours as needed for nausea.  Script sent to patients pharmacy.

## 2018-07-21 ENCOUNTER — Inpatient Hospital Stay: Payer: Medicare Other

## 2018-07-21 ENCOUNTER — Encounter: Payer: Self-pay | Admitting: Adult Health

## 2018-07-21 ENCOUNTER — Inpatient Hospital Stay (HOSPITAL_BASED_OUTPATIENT_CLINIC_OR_DEPARTMENT_OTHER): Payer: Medicare Other | Admitting: Adult Health

## 2018-07-21 ENCOUNTER — Other Ambulatory Visit: Payer: Self-pay

## 2018-07-21 VITALS — BP 121/75 | HR 119 | Temp 97.6°F | Resp 18 | Ht 67.0 in | Wt 160.9 lb

## 2018-07-21 DIAGNOSIS — E876 Hypokalemia: Secondary | ICD-10-CM

## 2018-07-21 DIAGNOSIS — F329 Major depressive disorder, single episode, unspecified: Secondary | ICD-10-CM | POA: Diagnosis not present

## 2018-07-21 DIAGNOSIS — C50412 Malignant neoplasm of upper-outer quadrant of left female breast: Secondary | ICD-10-CM | POA: Diagnosis not present

## 2018-07-21 DIAGNOSIS — F17210 Nicotine dependence, cigarettes, uncomplicated: Secondary | ICD-10-CM | POA: Diagnosis not present

## 2018-07-21 DIAGNOSIS — Z923 Personal history of irradiation: Secondary | ICD-10-CM | POA: Diagnosis not present

## 2018-07-21 DIAGNOSIS — Z7189 Other specified counseling: Secondary | ICD-10-CM

## 2018-07-21 DIAGNOSIS — C773 Secondary and unspecified malignant neoplasm of axilla and upper limb lymph nodes: Secondary | ICD-10-CM

## 2018-07-21 DIAGNOSIS — Z5111 Encounter for antineoplastic chemotherapy: Secondary | ICD-10-CM

## 2018-07-21 DIAGNOSIS — J910 Malignant pleural effusion: Secondary | ICD-10-CM

## 2018-07-21 DIAGNOSIS — Z171 Estrogen receptor negative status [ER-]: Secondary | ICD-10-CM

## 2018-07-21 DIAGNOSIS — I10 Essential (primary) hypertension: Secondary | ICD-10-CM

## 2018-07-21 DIAGNOSIS — Z95828 Presence of other vascular implants and grafts: Secondary | ICD-10-CM

## 2018-07-21 DIAGNOSIS — Z9012 Acquired absence of left breast and nipple: Secondary | ICD-10-CM

## 2018-07-21 DIAGNOSIS — Z170 Estrogen receptor positive status [ER+]: Secondary | ICD-10-CM

## 2018-07-21 DIAGNOSIS — J440 Chronic obstructive pulmonary disease with acute lower respiratory infection: Secondary | ICD-10-CM | POA: Diagnosis not present

## 2018-07-21 LAB — CBC WITH DIFFERENTIAL (CANCER CENTER ONLY)
Abs Immature Granulocytes: 1.13 10*3/uL — ABNORMAL HIGH (ref 0.00–0.07)
Basophils Absolute: 0.2 10*3/uL — ABNORMAL HIGH (ref 0.0–0.1)
Basophils Relative: 1 %
Eosinophils Absolute: 0 10*3/uL (ref 0.0–0.5)
Eosinophils Relative: 0 %
HCT: 30.5 % — ABNORMAL LOW (ref 36.0–46.0)
Hemoglobin: 9.2 g/dL — ABNORMAL LOW (ref 12.0–15.0)
Immature Granulocytes: 7 %
Lymphocytes Relative: 5 %
Lymphs Abs: 0.9 10*3/uL (ref 0.7–4.0)
MCH: 27.4 pg (ref 26.0–34.0)
MCHC: 30.2 g/dL (ref 30.0–36.0)
MCV: 90.8 fL (ref 80.0–100.0)
Monocytes Absolute: 1.4 10*3/uL — ABNORMAL HIGH (ref 0.1–1.0)
Monocytes Relative: 9 %
Neutro Abs: 12.2 10*3/uL — ABNORMAL HIGH (ref 1.7–7.7)
Neutrophils Relative %: 78 %
Platelet Count: 313 10*3/uL (ref 150–400)
RBC: 3.36 MIL/uL — ABNORMAL LOW (ref 3.87–5.11)
RDW: 15.9 % — ABNORMAL HIGH (ref 11.5–15.5)
WBC Count: 15.8 10*3/uL — ABNORMAL HIGH (ref 4.0–10.5)
nRBC: 0.6 % — ABNORMAL HIGH (ref 0.0–0.2)

## 2018-07-21 LAB — CMP (CANCER CENTER ONLY)
ALT: <6 U/L (ref 0–44)
AST: 12 U/L — ABNORMAL LOW (ref 15–41)
Albumin: 2.2 g/dL — ABNORMAL LOW (ref 3.5–5.0)
Alkaline Phosphatase: 91 U/L (ref 38–126)
Anion gap: 12 (ref 5–15)
BUN: 7 mg/dL — ABNORMAL LOW (ref 8–23)
CO2: 30 mmol/L (ref 22–32)
Calcium: 8.4 mg/dL — ABNORMAL LOW (ref 8.9–10.3)
Chloride: 102 mmol/L (ref 98–111)
Creatinine: 0.59 mg/dL (ref 0.44–1.00)
GFR, Est AFR Am: >60 mL/min (ref >60)
GFR, Estimated: >60 mL/min (ref >60)
Glucose, Bld: 106 mg/dL — ABNORMAL HIGH (ref 70–99)
Potassium: 3.2 mmol/L — ABNORMAL LOW (ref 3.5–5.1)
Sodium: 144 mmol/L (ref 135–145)
Total Bilirubin: 0.2 mg/dL — ABNORMAL LOW (ref 0.3–1.2)
Total Protein: 5.9 g/dL — ABNORMAL LOW (ref 6.5–8.1)

## 2018-07-21 MED ORDER — DRONABINOL 2.5 MG PO CAPS: 2.5000 mg | ORAL_CAPSULE | Freq: Two times a day (BID) | ORAL | 0 refills | Status: AC

## 2018-07-21 MED ORDER — SODIUM CHLORIDE 0.9% FLUSH
10.0000 mL | Freq: Once | INTRAVENOUS | Status: AC
  Administered 2018-07-21: 10 mL
  Filled 2018-07-21: qty 10

## 2018-07-21 MED ORDER — POTASSIUM CHLORIDE ER 10 MEQ PO TBCR: 20.0000 meq | EXTENDED_RELEASE_TABLET | Freq: Every day | ORAL | 0 refills | Status: AC

## 2018-07-21 MED ORDER — HEPARIN SOD (PORK) LOCK FLUSH 100 UNIT/ML IV SOLN
500.0000 [IU] | Freq: Once | INTRAVENOUS | Status: AC | PRN
  Administered 2018-07-21: 500 [IU]
  Filled 2018-07-21: qty 5

## 2018-07-21 MED ORDER — PROMETHAZINE HCL 25 MG PO TABS
12.5000 mg | ORAL_TABLET | Freq: Once | ORAL | Status: AC
  Administered 2018-07-21: 12.5 mg via ORAL

## 2018-07-21 MED ORDER — SODIUM CHLORIDE 0.9 % IV SOLN
Freq: Once | INTRAVENOUS | Status: AC
  Administered 2018-07-21: 14:00:00 via INTRAVENOUS
  Filled 2018-07-21: qty 250

## 2018-07-21 MED ORDER — SODIUM CHLORIDE 0.9% FLUSH
10.0000 mL | INTRAVENOUS | Status: DC | PRN
  Administered 2018-07-21: 10 mL
  Filled 2018-07-21: qty 10

## 2018-07-21 MED ORDER — SODIUM CHLORIDE 0.9 % IV SOLN
1.4000 mg/m2 | Freq: Once | INTRAVENOUS | Status: AC
  Administered 2018-07-21: 15:00:00 2.8 mg via INTRAVENOUS
  Filled 2018-07-21: qty 5.6

## 2018-07-21 MED ORDER — PROMETHAZINE HCL 25 MG PO TABS
ORAL_TABLET | ORAL | Status: AC
  Filled 2018-07-21: qty 1

## 2018-07-21 NOTE — Progress Notes (Signed)
[place] [facility] Follow up:    [name], MD [address] [place] [address]   DIAGNOSIS: Cancer Staging Breast cancer of upper-outer quadrant of left female breast [facility]) Staging form: Breast, AJCC 7th Edition - Pathologic: Stage IIA (yT1c, N1a, cM0) - Unsigned - Clinical: Stage IIIB (T4, N1, M0) - Unsigned   SUMMARY OF ONCOLOGIC HISTORY: Oncology History  Breast cancer of upper-outer quadrant of left female breast ([place])  12/19/2014 Mammogram   Left breast palpable mass at 2:00: 4 x 3.8 x 2.5 cm, enlarged left axillary lymph node 4 cm in size with diffuse skin thickening [place] 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   12/26/2014 Initial Diagnosis   Left breast biopsy: Invasive ductal carcinoma with lymphovascular invasion, 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, grade 3, ER/PR negative, Ki-67 80% HER-2 Neg   01/18/2015 - 06/21/2015 Neo-Adjuvant Chemotherapy   Dose dense Adriamycin and Cytoxan 4 followed by Taxol and carboplatin [id number]   07/03/2015 Breast MRI   Improvement in multiple areas of enhancement largest area 3.4 cm other suspicious nodules anterior to the mass also improved, left axillary internal mammary lymph nodes improved   08/23/2015 Surgery   Left mastectomy with axillary lymph node dissection: Invasive ductal carcinoma with calcifications grade 3, 1.3 cm, image, margins negative, 1/6 lymph nodes positive with extracapsular extension, ER 0%, 0%, T1cN1a stage II a    10/31/2015 - 01/08/2016 Radiation Therapy   Adj XRT Lisbeth Renshaw): Left Chest Wall and Left SCLV treated to 50.4 Gy in 28 fractions of 1.8 Gy. Left Chest Wall was then boosted to 60.4 Gy in 5 fractions of 2 Gy.   02/03/2016 - 08/03/2016 Chemotherapy   Xeloda 1500 mg by mouth twice a day 2 weeks on one week off   02/04/2017 - 02/10/2017 Hospital Admission   Admitted for bilateral pneumonia   05/13/2018 Relapse/Recurrence   Left pleural effusion  with left upper lobe consolidation and paratracheal and AP window lymphadenopathy: Thoracentesis: Cytology, adenocarcinoma consistent with breast primary, positive for GA TA-3 and CK7, negative for CK20 and TTF-1, ER PR HER-2 negative   06/01/2018 - 06/03/2018 Hospital Admission   Admitted for chest pain and shortness of breath status post thoracentesis 2.7 L removed and Pleurx catheter was placed cytology positive for adenocarcinoma   06/09/2018 -  Chemotherapy   The patient had pegfilgrastim (NEULASTA ONPRO KIT) injection 6 mg, 6 mg, Subcutaneous, Once, 1 of 4 cycles eriBULin mesylate (HALAVEN) 2.8 mg in sodium chloride 0.9 % 100 mL chemo infusion, 1.4 mg/m2 = 2.8 mg, Intravenous,  Once, 3 of 6 cycles Administration: 2.8 mg (06/09/2018), 2.8 mg (06/24/2018), 2.8 mg (07/07/2018)  for chemotherapy treatment.    06/16/2018 Miscellaneous   [facility] molecular testing: MMR proficient, TMB indeterminate, triple negative, ER negative, insufficient material for PDL 1, PTEN IHC positive mutation not detected     CURRENT THERAPY: Eribulin every 2 weeks  INTERVAL HISTORY: Deborah Mcintyre 63 y.o. female returns for evaluation prior to receiving chemotherapy for her metastatic breast cancer.  She is receiving Eribulin every two weeks.  She was recently admitted to the hospital with HCAP and neutropenia from 07/14/2018-07/18/2018.  She was given granix, IV antibiotics, and her pleurx was accessed by IR and drained.    Britten says that she is weak and tired.  She says that she has a decreased appetite.  She has found that phenergan oral works better for her nausea instead of compazine.  She would  like to receive this as a premedication instead.  Shawneen notes she has pain at her pleurx insertion site, and pain when she takes deep breaths.  She is taking percocet for her pain and she takes 3-4 tablets a day.  She is not constipated.  She says when she takes it she will sometimes sleep after taking it.  She denies any  increased drowsiness.   [name] lives with her daughter and her husband.  She notes that she has help when she needs it.      Patient Active Problem List   Diagnosis Date Noted  . HCAP (healthcare-associated pneumonia) 07/14/2018  . Loculated pleural effusion 06/30/2018  . Hypokalemia 06/30/2018  . Port-A-Cath in place 06/24/2018  . Status post chest tube placement ([place])   . Tobacco abuse 06/01/2018  . Prolonged QT interval 06/01/2018  . Chest pain 06/01/2018  . Elevated troponin 06/01/2018  . Cigarette smoker 05/27/2018  . Goals of care, counseling/discussion 05/19/2018  . 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  . Hypertension 05/12/2018  . COPD (chronic obstructive pulmonary disease) ([place]) 05/12/2018  . Anxiety and depression 05/12/2018  . Community acquired pneumonia of left lung 02/04/2017  . Antineoplastic chemotherapy induced pancytopenia ([place]) 04/26/2015  . Antineoplastic chemotherapy induced anemia 04/05/2015  . Breast cancer of upper-outer quadrant of left female breast ([place]) 01/01/2015  . Acute exacerbation of chronic bronchitis ([place]) 08/03/2013    is allergic to fentanyl and morphine and related.  MEDICAL HISTORY: Past Medical History:  Diagnosis Date  . Anxiety   . Arthritis   . Bronchitis   . COPD (chronic obstructive pulmonary disease) ([place])   . Depression   . Hypertension   . Malignant neoplasm of upper-outer quadrant of left female breast ([place]) 01/04/2015  . Nocturia   . Numbness and tingling    toes - bilateral  . Pneumonia   . PONV (postoperative nausea and vomiting)    Nausea    SURGICAL HISTORY: Past Surgical History:  Procedure Laterality Date  . BREAST LUMPECTOMY Bilateral    x3  . BREAST LUMPECTOMY WITH RADIOACTIVE SEED LOCALIZATION Right 08/23/2015   Procedure: RIGHT BREAST LUMPECTOMY WITH RADIOACTIVE SEED LOCALIZATION;  Surgeon: Alphonsa Overall, MD;  Location: [facility];  Service: General;  Laterality: Right;  . CESAREAN SECTION     x4  . IR  GUIDED DRAIN W CATHETER PLACEMENT  06/02/2018  . IR IMAGING GUIDED PORT INSERTION  05/25/2018  . IR REMOVAL TUN ACCESS W/ PORT W/O FL MOD SED  10/20/2016  . IR THORACENTESIS ASP PLEURAL SPACE W/IMG GUIDE  05/12/2018  . IR THORACENTESIS ASP PLEURAL SPACE W/IMG GUIDE  07/07/2018  . MASTECTOMY Left 08/23/2015    RIGHT BREAST LUMPECTOMY WITH RADIOACTIVE SEED LOCALIZATION,  LEFT MASTECTOMY WITH AXILLARY LYMPH NODE DISSECTION  . MASTECTOMY WITH AXILLARY LYMPH NODE DISSECTION Left 08/23/2015   Procedure: LEFT MASTECTOMY WITH AXILLARY LYMPH NODE DISSECTION;  Surgeon: Alphonsa Overall, MD;  Location: [facility];  Service: General;  Laterality: Left;  [name] MULTIPLE TOOTH EXTRACTIONS      SOCIAL HISTORY: Social History   Socioeconomic History  . Marital status: Single    Spouse name: Not on file  . Number of children: Not on file  . Years of education: Not on file  . Highest education level: Not on file  Occupational History  . Not on file  Social Needs  . Financial resource strain: Not on file  . Food insecurity    Worry: Not on file    Inability: Not on file  .  Transportation needs    Medical: Not on file    Non-medical: Not on file  Tobacco Use  . Smoking status: Current Every Day Smoker    Packs/day: 0.75    Years: 45.00    Pack years: 33.75    Types: Cigarettes  . Smokeless tobacco: Never Used  Substance and Sexual Activity  . Alcohol use: Yes    Comment: ocassionally  . Drug use: No  . Sexual activity: Not on file  Lifestyle  . Physical activity    Days per week: Not on file    Minutes per session: Not on file  . Stress: Not on file  Relationships  . Social Herbalist on phone: Not on file    Gets together: Not on file    Attends religious service: Not on file    Active member of club or organization: Not on file    Attends meetings of clubs or organizations: Not on file    Relationship status: Not on file  . Intimate partner violence    Fear of current or ex partner: Not  on file    Emotionally abused: Not on file    Physically abused: Not on file    Forced sexual activity: Not on file  Other Topics Concern  . Not on file  Social History Narrative  . Not on file    FAMILY HISTORY: Family History  Problem Relation Age of Onset  . Breast cancer Maternal Aunt     Review of Systems  Constitutional: Positive for fatigue. Negative for appetite change, chills, fever and unexpected weight change.  HENT:   Negative for hearing loss, lump/mass, mouth sores and trouble swallowing.   Eyes: Negative for eye problems and icterus.  Respiratory: Positive for shortness of breath (as per interval history with pleural effusion). Negative for chest tightness and cough.   Cardiovascular: Positive for chest pain (at pleurx site). Negative for leg swelling and palpitations.  Gastrointestinal: Negative for abdominal distention, abdominal pain, constipation, nausea and vomiting.  Endocrine: Negative for hot flashes.  Genitourinary: Negative for difficulty urinating.   Musculoskeletal: Negative for arthralgias.  Skin: Negative for itching and rash.  Neurological: Negative for dizziness, extremity weakness and headaches.  Hematological: Negative for adenopathy. Does not bruise/bleed easily.  Psychiatric/Behavioral: Negative for depression. The patient is not nervous/anxious.       PHYSICAL EXAMINATION  ECOG PERFORMANCE STATUS: 3 - Symptomatic, >50% confined to bed  Vitals:   07/21/18 1307  BP: 121/75  Pulse: (!) 119  Resp: 18  Temp: 97.6 F (36.4 C)  SpO2: 98%    Physical Exam Constitutional:      General: She is not in acute distress.    Appearance: Normal appearance. She is not toxic-appearing.  HENT:     Head: Normocephalic and atraumatic.     Nose: Nose normal.     Mouth/Throat:     Mouth: Mucous membranes are moist.     Pharynx: Oropharynx is clear. No oropharyngeal exudate or posterior oropharyngeal erythema.  Eyes:     General: No scleral  icterus.    Pupils: Pupils are equal, round, and reactive to light.  Neck:     Musculoskeletal: Neck supple.  Cardiovascular:     Rate and Rhythm: Regular rhythm.     Heart sounds: Normal heart sounds.     Comments: Slightly tachcyardic at 110, regular  Pulmonary:     Effort: Pulmonary effort is normal.     Comments: Left lung diminished  Abdominal:     General: Abdomen is flat. Bowel sounds are normal.     Palpations: Abdomen is soft.  Musculoskeletal: Normal range of motion.  Lymphadenopathy:     Cervical: No cervical adenopathy.  Skin:    General: Skin is warm and dry.     Capillary Refill: Capillary refill takes less than 2 seconds.     Findings: No rash.  Neurological:     General: No focal deficit present.     Mental Status: She is alert.  Psychiatric:        Mood and Affect: Mood normal.        Behavior: Behavior normal.     LABORATORY DATA:  CBC    Component Value Date/Time   WBC 15.8 (H) 07/21/2018 1215   WBC 3.7 (L) 07/17/2018 0357   RBC 3.36 (L) 07/21/2018 1215   HGB 9.2 (L) 07/21/2018 1215   HGB 14.0 01/07/2017 1308   HCT 30.5 (L) 07/21/2018 1215   HCT 43.5 01/07/2017 1308   PLT 313 07/21/2018 1215   PLT 142 (L) 01/07/2017 1308   MCV 90.8 07/21/2018 1215   MCV 101.2 (H) 01/07/2017 1308   MCH 27.4 07/21/2018 1215   MCHC 30.2 07/21/2018 1215   RDW 15.9 (H) 07/21/2018 1215   RDW 13.1 01/07/2017 1308   LYMPHSABS 0.9 07/21/2018 1215   LYMPHSABS 1.1 01/07/2017 1308   MONOABS 1.4 (H) 07/21/2018 1215   MONOABS 0.4 01/07/2017 1308   EOSABS 0.0 07/21/2018 1215   EOSABS 0.1 01/07/2017 1308   BASOSABS 0.2 (H) 07/21/2018 1215   BASOSABS 0.0 01/07/2017 1308    CMP     Component Value Date/Time   NA 144 07/21/2018 1215   NA 142 01/07/2017 1308   K 3.2 (L) 07/21/2018 1215   K 3.9 01/07/2017 1308   CL 102 07/21/2018 1215   CO2 30 07/21/2018 1215   CO2 26 01/07/2017 1308   GLUCOSE 106 (H) 07/21/2018 1215   GLUCOSE 107 01/07/2017 1308   BUN 7 (L)  07/21/2018 1215   BUN 9.7 01/07/2017 1308   CREATININE 0.59 07/21/2018 1215   CREATININE 0.8 01/07/2017 1308   CALCIUM 8.4 (L) 07/21/2018 1215   CALCIUM 9.2 01/07/2017 1308   PROT 5.9 (L) 07/21/2018 1215   PROT 7.4 01/07/2017 1308   ALBUMIN 2.2 (L) 07/21/2018 1215   ALBUMIN 3.9 01/07/2017 1308   AST 12 (L) 07/21/2018 1215   AST 12 01/07/2017 1308   ALT <6 07/21/2018 1215   ALT 8 01/07/2017 1308   ALKPHOS 91 07/21/2018 1215   ALKPHOS 69 01/07/2017 1308   BILITOT 0.2 (L) 07/21/2018 1215   BILITOT 0.40 01/07/2017 1308   GFRNONAA >60 07/21/2018 1215   GFRAA >60 07/21/2018 1215          ASSESSMENT and THERAPY PLAN:   Breast cancer of upper-outer quadrant of left female breast ([place]) 12/26/14: inflammatory breast cancerInvasive ductal carcinoma with lymphovascular invasion, 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  Patient received neoadjuvant chemotherapy with dose dense Adriamycin and Cytoxan followed by Taxol and carboplatin followed by left mastectomy and axillary lymph node dissection 08/23/2015 followed by adjuvant radiation completed 01/08/2016  Metastatic breast cancer: 05/13/2018:Left pleural effusion with left upper lobe consolidation and paratracheal and AP window lymphadenopathy: Thoracentesis: Cytology, adenocarcinoma  consistent with breast primary, positive for GA TA-3 and CK7, negative for CK20 and TTF-1, ER PR HER-2 negative.  [facility] molecular testing: PDL 1 insufficient tissue, no other actionable mutations PET CT scan: 6/83/4196: Hypermetabolic lymph nodes right thoracic inlet, mediastinum both hilar regions, left upper lobe mass hypermetabolic, pleural nodule, pleural activity, opacities peripheral right lung.  Current treatment: Palliative chemotherapy with  Halaven every 2 weeks    Asante is moderately well today.  Due to her neutropenia from chemo, and complication with HCAP requiring hospitalization, I added onpro to her regimen to be given on her treatment days.  I reviewed this with her and how the medication works.  I prescribed Marinol to help stimulate her appetite, BID at 2.9m.  I gave her information about this in her AVS as well as I recommended she sign up for mychart.    She will continue to work with home health regarding draining her pleurx.  Her pain continues to be under control with percocet and PMP aware review reveals no red flags.  She is tolerating this well.    We will see FMayceevery two weeks prior to her treatment and she will have restaging scans prior to her 8/27 treatment.  The above plan was reviewed with Dr. GLindi Adiewho is aware and assisted in its formulation.      Orders Placed This Encounter  Procedures  . CT Chest W Contrast    Standing Status:   Future    Standing Expiration Date:   07/21/2019    Order Specific Question:   If indicated for the ordered procedure, I authorize the administration of contrast media per Radiology protocol    Answer:   Yes    Order Specific Question:   [facility]?    [facility]   Order Specific Question:   Radiology Contrast Protocol - do NOT remove file path    Answer:   \\[email or web address].[email or web address]  . CT Abdomen Pelvis W Contrast    Standing Status:   Future    Standing Expiration Date:   07/21/2019    Order Specific Question:   If indicated for the ordered procedure, I authorize the administration of contrast media per Radiology protocol    Answer:   Yes    Order Specific Question:   [facility]   Order Specific Question:   Is Oral Contrast requested for this exam?    Answer:   Yes, Per Radiology protocol    Order Specific Question:   Radiology Contrast Protocol - do  NOT remove file path    Answer:   \\[email or web address].[email or web address]  . NM Bone Scan Whole Body    Standing Status:   Future    Standing Expiration Date:   07/21/2019    Order Specific Question:   If indicated for the ordered procedure, I authorize the administration of a radiopharmaceutical per Radiology protocol    Answer:   Yes    Order Specific Question:   [facility]    [facility]   Order Specific Question:   Radiology Contrast Protocol - do NOT remove file path    Answer:   \\[email or web address]    All questions were answered. The patient knows to call the clinic with any problems, questions or concerns. We can certainly see the patient much sooner if necessary.  A total of (30) minutes of face-to-face time was  spent with this patient with greater than 50% of that time in counseling and care-coordination.  This note was electronically signed. Scot Dock, NP 07/21/2018

## 2018-07-21 NOTE — Addendum Note (Signed)
Addended by: Scot Dock on: 07/21/2018 02:24 PM   Modules accepted: Orders

## 2018-07-21 NOTE — Assessment & Plan Note (Addendum)
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  Patient received neoadjuvant chemotherapy with dose dense Adriamycin and Cytoxan followed by Taxol and carboplatin followed by left mastectomy and axillary lymph node dissection 08/23/2015 followed by adjuvant radiation completed 01/08/2016  Metastatic breast cancer: 05/13/2018:Left pleural effusion with left upper lobe consolidation and paratracheal and AP window lymphadenopathy: Thoracentesis: Cytology, adenocarcinoma consistent with breast primary, positive for GA TA-3 and CK7, negative for CK20 and TTF-1, ER PR HER-2 negative.  [facility] molecular testing: PDL 1 insufficient tissue, no other actionable mutations PET CT scan: 5/99/2341: Hypermetabolic lymph nodes right thoracic inlet, mediastinum both hilar regions, left upper lobe mass hypermetabolic, pleural nodule, pleural activity, opacities peripheral right lung.  Current treatment: Palliative chemotherapy with Halaven every 2 weeks    Daesha is moderately well today.  Due to her neutropenia from chemo, and complication with HCAP requiring hospitalization, I added onpro to her regimen to be given on her treatment days.  I reviewed this with her and how the medication works.  I prescribed Marinol to help stimulate her appetite, BID at 2.33m.  I gave her information about this in her AVS as well as I recommended she sign up for mychart.    She will continue to work with home health regarding draining her pleurx.  Her pain continues to be under control with percocet and PMP aware review reveals no red flags.  She is tolerating this well.    We will see FFloyevery two weeks prior to her treatment and she  will have restaging scans prior to her 8/27 treatment.  The above plan was reviewed with Dr. GLindi Adiewho is aware and assisted in its formulation.

## 2018-07-21 NOTE — Patient Instructions (Signed)

## 2018-07-21 NOTE — Patient Instructions (Signed)
[facility] Discharge Instructions for Patients Receiving Chemotherapy  Today you received the following chemotherapy agent: Halaven   To help prevent nausea and vomiting after your treatment, we encourage you to take your nausea medication as directed.   If you develop nausea and vomiting that is not controlled by your nausea medication, call the clinic.   BELOW ARE SYMPTOMS THAT SHOULD BE REPORTED IMMEDIATELY:  *FEVER GREATER THAN 100.5 F  *CHILLS WITH OR WITHOUT FEVER  NAUSEA AND VOMITING THAT IS NOT CONTROLLED WITH YOUR NAUSEA MEDICATION  *UNUSUAL SHORTNESS OF BREATH  *UNUSUAL BRUISING OR BLEEDING  TENDERNESS IN MOUTH AND THROAT WITH OR WITHOUT PRESENCE OF ULCERS  *URINARY PROBLEMS  *BOWEL PROBLEMS  UNUSUAL RASH Items with * indicate a potential emergency and should be followed up as soon as possible.  Feel free to call the clinic should you have any questions or concerns. The clinic phone number is [phone].  Please show the [facility] at check-in to the Emergency Department and triage nurse.  Eribulin solution for injection What is this medicine? ERIBULIN (er e bu lin) is a chemotherapy drug. It is used to treat breast cancer and liposarcoma. This medicine may be used for other purposes; ask your health care provider or pharmacist if you have questions. COMMON BRAND NAME(S): Halaven What should I tell my health care provider before I take this medicine? They need to know if you have any of these conditions: -heart disease -history of irregular heartbeat -kidney disease -liver disease -low blood counts, like low white cell, platelet, or red cell counts -low levels of potassium or magnesium in the blood -an unusual or allergic reaction to eribulin, other medicines, foods, dyes, or preservatives -pregnant or trying to get pregnant -breast-feeding How should I use this medicine? This medicine is for infusion into a vein. It is given by a  health care professional in a hospital or clinic setting. Talk to your pediatrician regarding the use of this medicine in children. Special care may be needed. Overdosage: If you think you have taken too much of this medicine contact a poison control center or emergency room at once. NOTE: This medicine is only for you. Do not share this medicine with others. What if I miss a dose? It is important not to miss your dose. Call your doctor or health care professional if you are unable to keep an appointment. What may interact with this medicine? Do not take this medicine with any of the following medications: -amiodarone -astemizole -arsenic trioxide -bepridil -bretylium -chloroquine -chlorpromazine -cisapride -clarithromycin -dextromethorphan, quinidine -disopyramide -dofetilide -droperidol -dronedarone -erythromycin -grepafloxacin -halofantrine -haloperidol -ibutilide -levomethadyl -mesoridazine -methadone -pentamidine -procainamide -quinidine -pimozide -posaconazole -probucol -propafenone -saquinavir -sotalol -sparfloxacin -terfenadine -thioridazine -troleandomycin -ziprasidone This list may not describe all possible interactions. Give your health care provider a list of all the medicines, herbs, non-prescription drugs, or dietary supplements you use. Also tell them if you smoke, drink alcohol, or use illegal drugs. Some items may interact with your medicine. What should I watch for while using this medicine? This drug may make you feel generally unwell. This is not uncommon, as chemotherapy can affect healthy cells as well as cancer cells. Report any side effects. Continue your course of treatment even though you feel ill unless your doctor tells you to stop. Call your doctor or health care professional for advice if you get a fever, chills or sore throat, or other symptoms of a cold or flu. Do not treat yourself. This drug decreases  your body's ability to fight  infections. Try to avoid being around people who are sick. This medicine may increase your risk to bruise or bleed. Call your doctor or health care professional if you notice any unusual bleeding. You may need blood work done while you are taking this medicine. Do not become pregnant while taking this medicine or for 2 weeks after stopping it. Women should inform their doctor if they wish to become pregnant or think they might be pregnant. Men should not father a child while taking this medicine and for 3.5 months after stopping it. There is a potential for serious side effects to an unborn child. Talk to your health care professional or pharmacist for more information. Do not breast-feed an infant while taking this medicine or for 2 weeks after stopping it. What side effects may I notice from receiving this medicine? Side effects that you should report to your doctor or health care professional as soon as possible: -allergic reactions like skin rash, itching or hives, swelling of the face, lips, or tongue -low blood counts - this medicine may decrease the number of white blood cells, red blood cells and platelets. You may be at increased risk for infections and bleeding. -signs of infection - fever or chills, cough, sore throat, pain or difficulty passing urine -signs of decreased platelets or bleeding - bruising, pinpoint red spots on the skin, black, tarry stools, blood in the urine -signs of decreased red blood cells - unusually weak or tired, fainting spells, lightheadedness -pain, tingling, numbness in the hands or feet Side effects that usually do not require medical attention (report to your doctor or health care professional if they continue or are bothersome): -constipation -hair loss -headache -loss of appetite -muscle or joint pain -nausea, vomiting -stomach pain This list may not describe all possible side effects. Call your doctor for medical advice about side effects. You may  report side effects to FDA at 1-800-FDA-1088. Where should I keep my medicine? This drug is given in a hospital or clinic and will not be stored at home. NOTE: This sheet is a summary. It may not cover all possible information. If you have questions about this medicine, talk to your doctor, pharmacist, or health care provider.  2019 [employer]vier/Gold Standard (2015-01-24 10:11:26)

## 2018-07-21 NOTE — Patient Instructions (Signed)
Dronabinol, THC capsules What is this medicine? DRONABINOL (droe NAB i nol) is used to treat nausea and vomiting caused by cancer treatment, especially for those patients who do not respond to other medicines. This medicine is also used to increase appetite in AIDS patients. This medicine may be used for other purposes; ask your health care provider or pharmacist if you have questions. COMMON BRAND NAME(S): Marinol What should I tell my health care provider before I take this medicine? They need to know if you have any of these conditions:  a history of drug or alcohol abuse  heart disease, including angina or irregular heart rate  high or low blood pressure  dizziness or fainting spells on standing  mental health problems (schizophrenia, mania, depression)  seizures  an unusual or allergic reaction to dronabinol, marijuana, sesame oil, other medicines, foods, dyes, or preservatives  pregnant or trying to get pregnant  breast-feeding How should I use this medicine? Take this medicine by mouth. Follow the directions on the prescription label. Take your doses at regular intervals. Do not take your medicine more often than directed. Talk to your pediatrician regarding the use of this medicine in children. Special care may be needed. Overdosage: If you think you have taken too much of this medicine contact a poison control center or emergency room at once. NOTE: This medicine is only for you. Do not share this medicine with others. What if I miss a dose? If you miss a dose, take it as soon as you can. If it is almost time for your next dose, take only that dose. Do not take double or extra doses. What may interact with this medicine? Do not take this medicine with any of the following medications:  nabilone This medicine may also interact with the following medications:  alcohol-containing medicines or drinks  amphetamine or other stimulant drugs  antihistamines for allergy,  cough and cold  atropine  barbiturates such as phenobarbital  certain medicines for anxiety or sleep  certain medicines for bladder problems like oxybutynin, tolterodine  certain medicines for depression, like amitriptyline, fluoxetine, sertraline  certain medicines for seizures like phenobarbital, primidone  certain medicines for stomach problems like dicyclomine, hyoscyamine  certain medicines for travel sickness like scopolamine  certain medicines for Parkinson's disease like benztropine, trihexyphenidyl  cocaine  disulfiram  general anesthetics like halothane, isoflurane, methoxyflurane, propofol  ipratropium  lithium  local anesthetics like lidocaine, pramoxine, tetracaine  medicines that relax muscles for surgery  narcotic medicines for pain  phenothiazines like chlorpromazine, mesoridazine, prochlorperazine, thioridazine  theophylline This list may not describe all possible interactions. Give your health care provider a list of all the medicines, herbs, non-prescription drugs, or dietary supplements you use. Also tell them if you smoke, drink alcohol, or use illegal drugs. Some items may interact with your medicine. What should I watch for while using this medicine? The first time you take this medicine or have an increase in dose make sure there is a responsible person nearby. You may experience mood changes, easy laughter, or other changes in behavior. You may get drowsy or dizzy. Do not drive, use machinery, or do anything that needs mental alertness until you know how this medicine affects you. Do not stand or sit up quickly, especially if you have low blood pressure or if you are an older patient. This reduces the risk of dizzy or fainting spells. Alcohol may interfere with the effect of this medicine. Avoid alcoholic drinks. If you are taking this medicine  to improve your appetite, this is only part of your therapy. Discuss what you can eat and ways of  improving your diet with your health care professional or nutritionist. Frequent small snacks as well as regular meals can help provide extra calories and protein. Do not smoke marijuana while you are taking this medicine. It is similar to one of the active substances found in marijuana. You are at increased risk of serious heart and/or nervous system side effects if these drugs are used together. What side effects may I notice from receiving this medicine? Side effects that you should report to your doctor or health care professional as soon as possible:  allergic reactions like skin rash, itching or hives, swelling of the face, lips, or tongue  fast, irregular heartbeat  feeling faint or lightheaded, falls  hallucinations  panic reactions  seizures  slurred speech Side effects that usually do not require medical attention (report to your doctor or health care professional if they continue or are bothersome):  anxiety or nervousness  confusion  dizziness  nausea, vomiting or diarrhea  stomach upset  tiredness This list may not describe all possible side effects. Call your doctor for medical advice about side effects. You may report side effects to FDA at 1-800-FDA-1088. Where should I keep my medicine? This medicine may cause accidental overdose and death if taken by other adults, children, or pets. Keep out of the reach of children. This medicine can be abused. Keep your medicine in a safe place to protect it from theft. Do not share this medicine with anyone. Selling or giving away this medicine is dangerous and against the law. Store in a cool place between 8 and 15 degrees C (46 and 59 degrees F) or in a refrigerator. Avoid freezing. Mix any unused medicine with a substance like cat litter or coffee grounds. Then throw the medicine away in a sealed container like a sealed bag or a coffee can with a lid. Do not use the medicine after the expiration date. NOTE: This sheet is a  summary. It may not cover all possible information. If you have questions about this medicine, talk to your doctor, pharmacist, or health care provider.  2020 Elsevier/Gold Standard (2015-09-13 12:09:40)

## 2018-07-21 NOTE — Progress Notes (Signed)
Low potassium-pt instructed to pick up and start potassium supplements to day. She voiced understanding.

## 2018-07-22 ENCOUNTER — Telehealth: Payer: Self-pay | Admitting: Hematology and Oncology

## 2018-07-22 NOTE — Telephone Encounter (Signed)
I talk with patient regarding schedule  

## 2018-07-23 ENCOUNTER — Other Ambulatory Visit: Payer: Self-pay | Admitting: Adult Health

## 2018-07-23 DIAGNOSIS — E876 Hypokalemia: Secondary | ICD-10-CM

## 2018-07-25 ENCOUNTER — Other Ambulatory Visit: Payer: Self-pay | Admitting: Adult Health

## 2018-07-25 DIAGNOSIS — E876 Hypokalemia: Secondary | ICD-10-CM

## 2018-07-27 ENCOUNTER — Emergency Department (HOSPITAL_COMMUNITY): Payer: Medicare Other

## 2018-07-27 ENCOUNTER — Telehealth: Payer: Self-pay | Admitting: *Deleted

## 2018-07-27 ENCOUNTER — Other Ambulatory Visit: Payer: Self-pay

## 2018-07-27 ENCOUNTER — Encounter (HOSPITAL_COMMUNITY): Payer: Self-pay | Admitting: Internal Medicine

## 2018-07-27 ENCOUNTER — Inpatient Hospital Stay (HOSPITAL_COMMUNITY)
Admission: EM | Admit: 2018-07-27 | Discharge: 2018-08-04 | DRG: 175 | Disposition: A | Discharge status: Home / Self Care | Payer: Medicare Other | Attending: Internal Medicine | Admitting: Internal Medicine

## 2018-07-27 DIAGNOSIS — Z720 Tobacco use: Secondary | ICD-10-CM | POA: Diagnosis present

## 2018-07-27 DIAGNOSIS — F32A Depression, unspecified: Secondary | ICD-10-CM | POA: Diagnosis present

## 2018-07-27 DIAGNOSIS — D61810 Antineoplastic chemotherapy induced pancytopenia: Secondary | ICD-10-CM | POA: Diagnosis present

## 2018-07-27 DIAGNOSIS — E440 Moderate protein-calorie malnutrition: Secondary | ICD-10-CM | POA: Diagnosis not present

## 2018-07-27 DIAGNOSIS — Z9012 Acquired absence of left breast and nipple: Secondary | ICD-10-CM

## 2018-07-27 DIAGNOSIS — I2693 Single subsegmental pulmonary embolism without acute cor pulmonale: Secondary | ICD-10-CM | POA: Diagnosis present

## 2018-07-27 DIAGNOSIS — Z85118 Personal history of other malignant neoplasm of bronchus and lung: Secondary | ICD-10-CM

## 2018-07-27 DIAGNOSIS — I4891 Unspecified atrial fibrillation: Secondary | ICD-10-CM | POA: Diagnosis present

## 2018-07-27 DIAGNOSIS — C50412 Malignant neoplasm of upper-outer quadrant of left female breast: Secondary | ICD-10-CM | POA: Diagnosis not present

## 2018-07-27 DIAGNOSIS — C7800 Secondary malignant neoplasm of unspecified lung: Secondary | ICD-10-CM | POA: Diagnosis present

## 2018-07-27 DIAGNOSIS — Z853 Personal history of malignant neoplasm of breast: Secondary | ICD-10-CM | POA: Diagnosis not present

## 2018-07-27 DIAGNOSIS — J189 Pneumonia, unspecified organism: Secondary | ICD-10-CM | POA: Diagnosis present

## 2018-07-27 DIAGNOSIS — R627 Adult failure to thrive: Secondary | ICD-10-CM | POA: Diagnosis present

## 2018-07-27 DIAGNOSIS — R531 Weakness: Secondary | ICD-10-CM

## 2018-07-27 DIAGNOSIS — I10 Essential (primary) hypertension: Secondary | ICD-10-CM | POA: Diagnosis present

## 2018-07-27 DIAGNOSIS — I959 Hypotension, unspecified: Secondary | ICD-10-CM | POA: Diagnosis present

## 2018-07-27 DIAGNOSIS — R000 Tachycardia, unspecified: Secondary | ICD-10-CM | POA: Diagnosis present

## 2018-07-27 DIAGNOSIS — Z86711 Personal history of pulmonary embolism: Secondary | ICD-10-CM

## 2018-07-27 DIAGNOSIS — E876 Hypokalemia: Secondary | ICD-10-CM | POA: Diagnosis present

## 2018-07-27 DIAGNOSIS — J440 Chronic obstructive pulmonary disease with acute lower respiratory infection: Secondary | ICD-10-CM | POA: Diagnosis present

## 2018-07-27 DIAGNOSIS — G893 Neoplasm related pain (acute) (chronic): Secondary | ICD-10-CM | POA: Diagnosis not present

## 2018-07-27 DIAGNOSIS — R079 Chest pain, unspecified: Secondary | ICD-10-CM | POA: Diagnosis not present

## 2018-07-27 DIAGNOSIS — F17210 Nicotine dependence, cigarettes, uncomplicated: Secondary | ICD-10-CM | POA: Diagnosis present

## 2018-07-27 DIAGNOSIS — C50919 Malignant neoplasm of unspecified site of unspecified female breast: Secondary | ICD-10-CM | POA: Diagnosis not present

## 2018-07-27 DIAGNOSIS — I2699 Other pulmonary embolism without acute cor pulmonale: Secondary | ICD-10-CM | POA: Diagnosis present

## 2018-07-27 DIAGNOSIS — Z79899 Other long term (current) drug therapy: Secondary | ICD-10-CM

## 2018-07-27 DIAGNOSIS — F419 Anxiety disorder, unspecified: Secondary | ICD-10-CM | POA: Diagnosis not present

## 2018-07-27 DIAGNOSIS — Z515 Encounter for palliative care: Secondary | ICD-10-CM | POA: Diagnosis present

## 2018-07-27 DIAGNOSIS — T451X5A Adverse effect of antineoplastic and immunosuppressive drugs, initial encounter: Secondary | ICD-10-CM | POA: Diagnosis present

## 2018-07-27 DIAGNOSIS — Z7189 Other specified counseling: Secondary | ICD-10-CM | POA: Diagnosis not present

## 2018-07-27 DIAGNOSIS — J449 Chronic obstructive pulmonary disease, unspecified: Secondary | ICD-10-CM | POA: Diagnosis not present

## 2018-07-27 DIAGNOSIS — J910 Malignant pleural effusion: Secondary | ICD-10-CM | POA: Diagnosis present

## 2018-07-27 DIAGNOSIS — Z20828 Contact with and (suspected) exposure to other viral communicable diseases: Secondary | ICD-10-CM | POA: Diagnosis present

## 2018-07-27 DIAGNOSIS — Z7982 Long term (current) use of aspirin: Secondary | ICD-10-CM

## 2018-07-27 DIAGNOSIS — F329 Major depressive disorder, single episode, unspecified: Secondary | ICD-10-CM | POA: Diagnosis present

## 2018-07-27 DIAGNOSIS — Z8701 Personal history of pneumonia (recurrent): Secondary | ICD-10-CM | POA: Diagnosis not present

## 2018-07-27 DIAGNOSIS — Z79891 Long term (current) use of opiate analgesic: Secondary | ICD-10-CM

## 2018-07-27 DIAGNOSIS — I361 Nonrheumatic tricuspid (valve) insufficiency: Secondary | ICD-10-CM | POA: Diagnosis not present

## 2018-07-27 DIAGNOSIS — J90 Pleural effusion, not elsewhere classified: Secondary | ICD-10-CM

## 2018-07-27 LAB — COMPREHENSIVE METABOLIC PANEL
ALT: 7 U/L (ref 0–44)
AST: 13 U/L — ABNORMAL LOW (ref 15–41)
Albumin: 2.6 g/dL — ABNORMAL LOW (ref 3.5–5.0)
Alkaline Phosphatase: 56 U/L (ref 38–126)
Anion gap: 13 (ref 5–15)
BUN: 6 mg/dL — ABNORMAL LOW (ref 8–23)
CO2: 30 mmol/L (ref 22–32)
Calcium: 8.6 mg/dL — ABNORMAL LOW (ref 8.9–10.3)
Chloride: 100 mmol/L (ref 98–111)
Creatinine, Ser: 0.54 mg/dL (ref 0.44–1.00)
GFR calc Af Amer: >60 mL/min (ref >60)
GFR calc non Af Amer: >60 mL/min (ref >60)
Glucose, Bld: 94 mg/dL (ref 70–99)
Potassium: 3 mmol/L — ABNORMAL LOW (ref 3.5–5.1)
Sodium: 143 mmol/L (ref 135–145)
Total Bilirubin: 0.5 mg/dL (ref 0.3–1.2)
Total Protein: 6.2 g/dL — ABNORMAL LOW (ref 6.5–8.1)

## 2018-07-27 LAB — CBC WITH DIFFERENTIAL/PLATELET
Band Neutrophils: 8 %
Basophils Absolute: 0 10*3/uL (ref 0.0–0.1)
Basophils Relative: 0 %
Blasts: 0 %
Eosinophils Absolute: 0 10*3/uL (ref 0.0–0.5)
Eosinophils Relative: 0 %
HCT: 29.1 % — ABNORMAL LOW (ref 36.0–46.0)
Hemoglobin: 8.6 g/dL — ABNORMAL LOW (ref 12.0–15.0)
Lymphocytes Relative: 27 %
Lymphs Abs: 0.6 10*3/uL — ABNORMAL LOW (ref 0.7–4.0)
MCH: 27.7 pg (ref 26.0–34.0)
MCHC: 29.6 g/dL — ABNORMAL LOW (ref 30.0–36.0)
MCV: 93.6 fL (ref 80.0–100.0)
Metamyelocytes Relative: 0 %
Monocytes Absolute: 0.3 10*3/uL (ref 0.1–1.0)
Monocytes Relative: 12 %
Myelocytes: 0 %
Neutro Abs: 1.4 10*3/uL — ABNORMAL LOW (ref 1.7–7.7)
Neutrophils Relative %: 53 %
Other: 0 %
Platelets: 355 10*3/uL (ref 150–400)
Promyelocytes Relative: 0 %
RBC: 3.11 MIL/uL — ABNORMAL LOW (ref 3.87–5.11)
RDW: 15.7 % — ABNORMAL HIGH (ref 11.5–15.5)
WBC: 2.3 10*3/uL — ABNORMAL LOW (ref 4.0–10.5)
nRBC: 0 % (ref 0.0–0.2)
nRBC: 0 /100 WBC

## 2018-07-27 LAB — D-DIMER, QUANTITATIVE: D-Dimer, Quant: 4.69 ug/mL-FEU — ABNORMAL HIGH (ref 0.00–0.50)

## 2018-07-27 LAB — LACTIC ACID, PLASMA
Lactic Acid, Venous: 1.2 mmol/L (ref 0.5–1.9)
Lactic Acid, Venous: 0.9 mmol/L (ref 0.5–1.9)

## 2018-07-27 LAB — TROPONIN I (HIGH SENSITIVITY)
Troponin I (High Sensitivity): 4 ng/L (ref <18)
Troponin I (High Sensitivity): 5 ng/L (ref <18)

## 2018-07-27 LAB — MAGNESIUM: Magnesium: 1.9 mg/dL (ref 1.7–2.4)

## 2018-07-27 LAB — BRAIN NATRIURETIC PEPTIDE: B Natriuretic Peptide: 67.6 pg/mL (ref 0.0–100.0)

## 2018-07-27 LAB — SARS CORONAVIRUS 2 BY RT PCR (HOSPITAL ORDER, PERFORMED IN ~~LOC~~ HOSPITAL LAB): SARS Coronavirus 2: NEGATIVE

## 2018-07-27 MED ORDER — OXYCODONE HCL 5 MG PO TABS
5.0000 mg | ORAL_TABLET | ORAL | Status: DC | PRN
  Administered 2018-07-28: 5 mg via ORAL
  Filled 2018-07-27 (×2): qty 1

## 2018-07-27 MED ORDER — APIXABAN 5 MG PO TABS
10.0000 mg | ORAL_TABLET | Freq: Two times a day (BID) | ORAL | Status: AC
  Administered 2018-07-27 – 2018-08-03 (×14): 10 mg via ORAL
  Filled 2018-07-27 (×14): qty 2

## 2018-07-27 MED ORDER — POTASSIUM CHLORIDE IN NACL 40-0.9 MEQ/L-% IV SOLN
INTRAVENOUS | Status: AC
  Administered 2018-07-27: 100 mL/h via INTRAVENOUS
  Filled 2018-07-27: qty 1000

## 2018-07-27 MED ORDER — POTASSIUM CHLORIDE CRYS ER 10 MEQ PO TBCR
20.0000 meq | EXTENDED_RELEASE_TABLET | Freq: Every day | ORAL | Status: DC
  Administered 2018-07-28 – 2018-08-04 (×8): 20 meq via ORAL
  Filled 2018-07-27 (×9): qty 2

## 2018-07-27 MED ORDER — SODIUM CHLORIDE (PF) 0.9 % IJ SOLN
INTRAMUSCULAR | Status: AC
  Filled 2018-07-27: qty 50

## 2018-07-27 MED ORDER — IOHEXOL 350 MG/ML SOLN
100.0000 mL | Freq: Once | INTRAVENOUS | Status: AC | PRN
  Administered 2018-07-27: 100 mL via INTRAVENOUS

## 2018-07-27 MED ORDER — ASPIRIN EC 81 MG PO TBEC
81.0000 mg | DELAYED_RELEASE_TABLET | Freq: Every day | ORAL | Status: DC
  Administered 2018-07-28: 09:00:00 81 mg via ORAL
  Filled 2018-07-27: qty 1

## 2018-07-27 MED ORDER — HYDROMORPHONE HCL 1 MG/ML IJ SOLN
0.5000 mg | Freq: Once | INTRAMUSCULAR | Status: AC
  Administered 2018-07-27: 0.5 mg via INTRAVENOUS
  Filled 2018-07-27: qty 1

## 2018-07-27 MED ORDER — ENSURE ENLIVE PO LIQD
237.0000 mL | Freq: Two times a day (BID) | ORAL | Status: DC
  Administered 2018-07-28: 237 mL via ORAL

## 2018-07-27 MED ORDER — APIXABAN 5 MG PO TABS
5.0000 mg | ORAL_TABLET | Freq: Two times a day (BID) | ORAL | Status: DC
  Administered 2018-08-03 – 2018-08-04 (×2): 5 mg via ORAL
  Filled 2018-07-27 (×2): qty 1

## 2018-07-27 MED ORDER — HYDROMORPHONE HCL 1 MG/ML IJ SOLN
1.0000 mg | INTRAMUSCULAR | Status: AC | PRN
  Administered 2018-07-27 – 2018-07-28 (×3): 1 mg via INTRAVENOUS
  Filled 2018-07-27 (×3): qty 1

## 2018-07-27 MED ORDER — PAROXETINE HCL 20 MG PO TABS
20.0000 mg | ORAL_TABLET | Freq: Two times a day (BID) | ORAL | Status: DC
  Administered 2018-07-27 – 2018-08-04 (×16): 20 mg via ORAL
  Filled 2018-07-27 (×16): qty 1

## 2018-07-27 MED ORDER — METOPROLOL TARTRATE 25 MG PO TABS
25.0000 mg | ORAL_TABLET | Freq: Two times a day (BID) | ORAL | Status: DC
  Administered 2018-07-27 – 2018-07-28 (×3): 25 mg via ORAL
  Filled 2018-07-27 (×3): qty 1

## 2018-07-27 MED ORDER — PROCHLORPERAZINE EDISYLATE 10 MG/2ML IJ SOLN: 5.0000 mg | INTRAMUSCULAR | Status: DC | PRN

## 2018-07-27 MED ORDER — AMLODIPINE BESYLATE 5 MG PO TABS
5.0000 mg | ORAL_TABLET | Freq: Every day | ORAL | Status: DC
  Administered 2018-07-28: 5 mg via ORAL
  Filled 2018-07-27: qty 1

## 2018-07-27 MED ORDER — DRONABINOL 2.5 MG PO CAPS
2.5000 mg | ORAL_CAPSULE | Freq: Two times a day (BID) | ORAL | Status: DC
  Administered 2018-07-28 – 2018-08-03 (×13): 2.5 mg via ORAL
  Filled 2018-07-27 (×14): qty 1

## 2018-07-27 MED ORDER — SODIUM CHLORIDE 0.9 % IV BOLUS
1000.0000 mL | Freq: Once | INTRAVENOUS | Status: AC
  Administered 2018-07-27: 1000 mL via INTRAVENOUS

## 2018-07-27 MED ORDER — ACETAMINOPHEN 650 MG RE SUPP: 650.0000 mg | Freq: Four times a day (QID) | RECTAL | Status: DC | PRN

## 2018-07-27 MED ORDER — POTASSIUM CHLORIDE CRYS ER 20 MEQ PO TBCR
40.0000 meq | EXTENDED_RELEASE_TABLET | Freq: Once | ORAL | Status: AC
  Administered 2018-07-27: 40 meq via ORAL
  Filled 2018-07-27: qty 2

## 2018-07-27 MED ORDER — NICOTINE 21 MG/24HR TD PT24
21.0000 mg | MEDICATED_PATCH | TRANSDERMAL | Status: DC
  Administered 2018-07-27 – 2018-08-03 (×8): 21 mg via TRANSDERMAL
  Filled 2018-07-27 (×8): qty 1

## 2018-07-27 MED ORDER — ACETAMINOPHEN 325 MG PO TABS: 650.0000 mg | ORAL_TABLET | Freq: Four times a day (QID) | ORAL | Status: DC | PRN

## 2018-07-27 MED ORDER — GABAPENTIN 400 MG PO CAPS
400.0000 mg | ORAL_CAPSULE | Freq: Three times a day (TID) | ORAL | Status: DC
  Administered 2018-07-27 – 2018-08-04 (×23): 400 mg via ORAL
  Filled 2018-07-27 (×24): qty 1

## 2018-07-27 MED ORDER — MAGNESIUM SULFATE 2 GM/50ML IV SOLN
2.0000 g | Freq: Once | INTRAVENOUS | Status: AC
  Administered 2018-07-27: 2 g via INTRAVENOUS
  Filled 2018-07-27: qty 50

## 2018-07-27 MED ORDER — ALBUTEROL SULFATE (2.5 MG/3ML) 0.083% IN NEBU: 2.5000 mg | INHALATION_SOLUTION | Freq: Four times a day (QID) | RESPIRATORY_TRACT | Status: DC | PRN

## 2018-07-27 NOTE — ED Notes (Signed)
Pleur-X connected for ~10 minutes per patient request-patient states she is only able to tolerate 5-10 minutes at a time. 15 cc's bloody drainage with mucous-stringy clot noted

## 2018-07-27 NOTE — Telephone Encounter (Signed)
"  Eulis Foster RN with [facility] [phone]).  Deborah Mcintyre reports increased problems breathing the past three to four days from increased fluid.    Trouble draining PleuRx today.  10 ml bright red junk drained by me.  Reports 50 ml from PleuRx two days ago.    Decreased appetite.    Chills last night without fever.    Dizziness.  Currently B/P = 100/70.    Breathing appears normal, no respiratory distress just wishes to go to the ED."  Verbal order received and read back from Dr. Lindi Adie for [facility] visit if possible or alternative to report to ED.    Order given to [facility] nurse for patient to report to ED.

## 2018-07-27 NOTE — ED Provider Notes (Signed)
[facility] DEPT Provider Note   CSN: [id number] Arrival date & time: 07/27/18  1446    History   Chief Complaint No chief complaint on file.   HPI Deborah Mcintyre is a 63 y.o. female.     HPI  63 year old female presents with chest pain and shortness of breath.  She has a chronic pleural effusion with a drain.  Over the last 3 days she has been feeling a little more short of breath than typical and has tried to drain it through her catheter but only a maximum of 50 cc has come out.  Cough is chronic and unchanged.  No fevers.  She chronically has left-sided chest pain but she is also having intermittent sharp mid chest pain.  Worse with coughing, movements, inspiration but also exertion.  It is about a 4-5 out of 10.  No lower extremity swelling.  This chest pain she has never had before.  Past Medical History:  Diagnosis Date   Anxiety    Arthritis    Bronchitis    COPD (chronic obstructive pulmonary disease) (HCC)    Depression    Hypertension    Malignant neoplasm of upper-outer quadrant of left female breast (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   Hypokalemia 06/30/2018   Port-A-Cath in place 06/24/2018   Status post chest tube placement ([place])    Tobacco abuse 06/01/2018   Prolonged QT interval 06/01/2018   Chest pain 06/01/2018   Elevated troponin 06/01/2018   Cigarette smoker 05/27/2018   Goals of care, counseling/discussion 05/19/2018   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   Hypertension 05/12/2018   COPD (chronic obstructive pulmonary disease) ([place]) 05/12/2018   Anxiety and depression 05/12/2018   Community  acquired pneumonia of left lung 02/04/2017   Antineoplastic chemotherapy induced pancytopenia ([place]) 04/26/2015   Antineoplastic chemotherapy induced anemia 04/05/2015   Breast cancer of upper-outer quadrant of left female breast ([place]) 01/01/2015   Acute exacerbation of chronic bronchitis ([place]) 08/03/2013    Past Surgical History:  Procedure Laterality Date   BREAST LUMPECTOMY Bilateral    x3   BREAST LUMPECTOMY WITH RADIOACTIVE SEED LOCALIZATION Right 08/23/2015   Procedure: RIGHT BREAST LUMPECTOMY WITH RADIOACTIVE SEED LOCALIZATION;  Surgeon: Alphonsa Overall, MD;  Location: [facility];  Service: General;  Laterality: Right;   CESAREAN SECTION     x4   IR GUIDED DRAIN W CATHETER PLACEMENT  06/02/2018   IR IMAGING GUIDED PORT INSERTION  05/25/2018   IR REMOVAL TUN ACCESS W/ PORT W/O FL MOD SED  10/20/2016   IR THORACENTESIS ASP PLEURAL SPACE W/IMG GUIDE  05/12/2018   IR THORACENTESIS ASP PLEURAL SPACE W/IMG GUIDE  07/07/2018   MASTECTOMY Left 08/23/2015    RIGHT BREAST LUMPECTOMY WITH RADIOACTIVE SEED LOCALIZATION,  LEFT MASTECTOMY WITH AXILLARY LYMPH NODE DISSECTION   MASTECTOMY WITH AXILLARY LYMPH NODE DISSECTION Left 08/23/2015   Procedure: LEFT MASTECTOMY WITH AXILLARY LYMPH NODE DISSECTION;  Surgeon: Alphonsa Overall, MD;  Location: [facility] OR;  Service: General;  Laterality: Left;   MULTIPLE TOOTH EXTRACTIONS       OB History   No obstetric history on file.      Home Medications  Prior to Admission medications   Medication Sig Start Date End Date Taking? Authorizing Provider  albuterol (PROVENTIL HFA;VENTOLIN HFA) 108 (90 Base) MCG/ACT inhaler Inhale 2 puffs into the lungs every 6 (six) hours as needed for wheezing or shortness of breath.    Yes [provider]  albuterol (PROVENTIL) (2.5 MG/3ML) 0.083% nebulizer solution Take 3 mLs (2.5 mg total) by nebulization every 6 (six) hours as needed for up to 30 days for wheezing or shortness of breath. 07/02/18 08/01/18 Yes  Alma Friendly, MD  amLODipine (NORVASC) 5 MG tablet Take 1 tablet (5 mg total) by mouth daily. 05/16/18  Yes Regalado, Belkys A, MD  aspirin EC 81 MG tablet Take 81 mg by mouth daily.   Yes [provider]  dronabinol (MARINOL) 2.5 MG capsule Take 1 capsule (2.5 mg total) by mouth 2 (two) times daily before a meal. 07/21/18  Yes Causey, Charlestine Massed, NP  gabapentin (NEURONTIN) 400 MG capsule Take 1 capsule (400 mg total) by mouth 3 (three) times daily. 07/17/18  Yes Kayleen Memos, DO  lidocaine-prilocaine (EMLA) cream Apply to affected area once Patient taking differently: Apply 1 application topically daily as needed (port access).  05/19/18  Yes Nicholas Lose, MD  metoprolol tartrate (LOPRESSOR) 25 MG tablet Take 1 tablet (25 mg total) by mouth 2 (two) times daily. 06/02/18  Yes Georgette Shell, MD  Multiple Vitamin (MULTIVITAMIN WITH MINERALS) TABS tablet Take 1 tablet by mouth daily. 07/18/18  Yes Hall, Carole N, DO  nicotine (NICODERM CQ - DOSED IN MG/24 HOURS) 21 mg/24hr patch Place 1 patch (21 mg total) onto the skin daily. 07/19/18  Yes Nicholas Lose, MD  nystatin (MYCOSTATIN) 100000 UNIT/ML suspension Use as directed 5 mLs (500,000 Units total) in the mouth or throat 4 (four) times daily. 07/17/18  Yes Hall, Carole N, DO  ondansetron (ZOFRAN) 8 MG tablet Take 1 tablet (8 mg total) by mouth every 8 (eight) hours as needed for nausea. 09/23/17  Yes Nicholas Lose, MD  oxyCODONE-acetaminophen (PERCOCET/ROXICET) 5-325 MG tablet Take 1 tablet by mouth every 8 (eight) hours as needed for moderate pain or severe pain (1 tab every 4-6 hour prn pain). 07/19/18  Yes Nicholas Lose, MD  PARoxetine (PAXIL) 20 MG tablet Take 1 tablet (20 mg total) by mouth 2 (two) times daily. 05/15/18  Yes Regalado, Belkys A, MD  potassium chloride (K-DUR) 10 MEQ tablet Take 2 tablets (20 mEq total) by mouth daily. 07/21/18  Yes Causey, Charlestine Massed, NP  ALPRAZolam Duanne Moron) 0.5 MG tablet Take 1 tablet (0.5  mg total) by mouth 3 (three) times daily as needed for anxiety or sleep. Patient not taking: Reported on 07/27/2018 05/15/18   Regalado, Jerald Kief A, MD  benzonatate (TESSALON) 200 MG capsule Take 1 capsule (200 mg total) by mouth 2 (two) times daily as needed for cough. Patient not taking: Reported on 07/27/2018 07/17/18   Kayleen Memos, DO  dexamethasone (DECADRON) 1 MG tablet Take 1 tablet (1 mg total) by mouth daily. Patient not taking: Reported on 07/27/2018 06/17/18   Nicholas Lose, MD  PREMARIN vaginal cream INSERT 1 APPLICATORFUL VAGINALLY DAILY Patient taking differently: Place 1 Applicatorful vaginally daily as needed (dryness).  04/19/17   Nicholas Lose, MD  prochlorperazine (COMPAZINE) 10 MG tablet Take 1 tablet (10 mg total) by mouth every 6 (six) hours as needed (Nausea or vomiting). Patient taking differently: Take 10 mg by mouth every 6 (six) hours as needed for nausea or vomiting.  05/19/18  Nicholas Lose, MD  promethazine (PHENERGAN) 12.5 MG tablet Take 1 tablet (12.5 mg total) by mouth every 6 (six) hours as needed for nausea or vomiting. 07/19/18   Nicholas Lose, MD    Family History Family History  Problem Relation Age of Onset   Breast cancer Maternal Aunt     Social History Social History   Tobacco Use   Smoking status: Current Every Day Smoker    Packs/day: 0.75    Years: 45.00    Pack years: 33.75    Types: Cigarettes   Smokeless tobacco: Never Used  Substance Use Topics   Alcohol use: Yes    Comment: ocassionally   Drug use: No     Allergies   Fentanyl and Morphine and related   Review of Systems Review of Systems  Constitutional: Negative for fever.  Respiratory: Positive for cough and shortness of breath.   Cardiovascular: Positive for chest pain. Negative for leg swelling.  All other systems reviewed and are negative.    Physical Exam Updated Vital Signs BP (!) 129/99    Pulse (!) 116    Temp 98.2 F (36.8 C) (Oral)    Resp (!) 25    Ht 5'  7.5" (1.715 m)    Wt 72.6 kg    SpO2 96%    BMI 24.69 kg/m   Physical Exam Vitals signs and nursing note reviewed.  Constitutional:      General: She is not in acute distress.    Appearance: She is well-developed. She is not ill-appearing or diaphoretic.  HENT:     Head: Normocephalic and atraumatic.     Right Ear: External ear normal.     Left Ear: External ear normal.     Nose: Nose normal.  Eyes:     General:        Right eye: No discharge.        Left eye: No discharge.  Cardiovascular:     Rate and Rhythm: Regular rhythm. Tachycardia present.     Heart sounds: Normal heart sounds.  Pulmonary:     Effort: Pulmonary effort is normal. No tachypnea or accessory muscle usage.     Breath sounds: Examination of the left-upper field reveals decreased breath sounds. Examination of the left-middle field reveals decreased breath sounds. Examination of the left-lower field reveals decreased breath sounds. Decreased breath sounds present.  Chest:     Chest wall: Tenderness present.  Abdominal:     General: There is no distension.     Palpations: Abdomen is soft.     Tenderness: There is no abdominal tenderness.  Skin:    General: Skin is warm and dry.  Neurological:     Mental Status: She is alert.  Psychiatric:        Mood and Affect: Mood is not anxious.      ED Treatments / Results  Labs (all labs ordered are listed, but only abnormal results are displayed) Labs Reviewed  CBC WITH DIFFERENTIAL/PLATELET - Abnormal; Notable for the following components:      Result Value   WBC 2.3 (*)    RBC 3.11 (*)    Hemoglobin 8.6 (*)    HCT 29.1 (*)    MCHC 29.6 (*)    RDW 15.7 (*)    Neutro Abs 1.4 (*)    Lymphs Abs 0.6 (*)    All other components within normal limits  COMPREHENSIVE METABOLIC PANEL - Abnormal; Notable for the following components:   Potassium 3.0 (*)  BUN 6 (*)    Calcium 8.6 (*)    Total Protein 6.2 (*)    Albumin 2.6 (*)    AST 13 (*)    All other  components within normal limits  D-DIMER, QUANTITATIVE (NOT AT [facility]) - Abnormal; Notable for the following components:   D-Dimer, Quant 4.69 (*)    All other components within normal limits  CULTURE, BLOOD (ROUTINE X 2)  CULTURE, BLOOD (ROUTINE X 2)  SARS CORONAVIRUS 2 (HOSPITAL ORDER, [facility])  LACTIC ACID, PLASMA  LACTIC ACID, PLASMA  MAGNESIUM  CBC WITH DIFFERENTIAL/PLATELET  COMPREHENSIVE METABOLIC PANEL  TROPONIN I (HIGH SENSITIVITY)  TROPONIN I (HIGH SENSITIVITY)    EKG None  Radiology Ct Angio Chest Pe W And/or Wo Contrast  Result Date: 07/27/2018 CLINICAL DATA:  Breast and lung cancer, chest tube, shortness of breath for 3 days, tube not draining properly, elevated D-dimer, question pulmonary embolism EXAM: CT ANGIOGRAPHY CHEST WITH CONTRAST TECHNIQUE: Multidetector CT imaging of the chest was performed using the standard protocol during bolus administration of intravenous contrast. Multiplanar CT image reconstructions and MIPs were obtained to evaluate the vascular anatomy. CONTRAST:  172mL OMNIPAQUE IOHEXOL 350 MG/ML SOLN COMPARISON:  06/30/2018 FINDINGS: Cardiovascular: Atherosclerotic calcifications of thoracic aorta. Ascending aorta 4.0 cm transverse. No evidence of dissection. No pericardial effusion. Pulmonary arteries adequately opacified. Small pulmonary embolus identified within a RIGHT lower lobe pulmonary artery, medial basal segment. No additional pulmonary emboli are definitely visualized. RIGHT jugular Port-A-Cath with tip in SVC. Few additional normal sized Mediastinum/Nodes: Probable enlarged precarinal lymph node 15 mm short axis image 33. 13 mm short axis AP window node image 36. Subcarinal adenopathy 23 mm short axis previously 22 mm. Esophagus unremarkable. Base of cervical region normal appearance. No axillary adenopathy. Prior LEFT mastectomy. Lungs/Pleura: Large loculated LEFT pleural effusion despite thoracostomy tube. Complete  collapse of the LEFT upper lobe with abrupt cut off of air within LEFT upper lobe bronchus. Mild compressive atelectasis of LEFT lower lobe with narrowing of LEFT lower lobe airways and peribronchial thickening. Patchy areas of opacity in periphery of RIGHT lung, greater in RIGHT lower lobe, favor pneumonia though attention on follow-up recommended to exclude developing tumor. No pneumothorax. No RIGHT pleural effusion. Upper Abdomen: 14 mm short axis gastrohepatic ligament lymph node image 90. Visualized upper abdomen otherwise unremarkable Musculoskeletal: No acute osseous findings. Review of the MIP images confirms the above findings. IMPRESSION: Small pulmonary embolus in RIGHT lower lobe medially. Large loculated RIGHT pleural effusion with complete atelectasis of LEFT upper lobe and partial atelectasis of LEFT lower lobe. Patchy opacities in RIGHT lung favor pneumonia, slightly increased in RIGHT lower lobe since previous exam, recommend follow-up until resolution to exclude underlying mass. Persistent mediastinal adenopathy. Mildly enlarged gastrohepatic ligament lymph node. Aortic Atherosclerosis (ICD10-I70.0). Findings called to Dr. Regenia Skeeter on 07/27/2018 at 1904 hrs. Electronically Signed   By: Lavonia Dana M.D.   On: 07/27/2018 19:05   Dg Chest Portable 1 View  Result Date: 07/27/2018 CLINICAL DATA:  Dyspnea and short of breath. History of breast cancer and lung cancer. Chest tube EXAM: PORTABLE CHEST 1 VIEW COMPARISON:  07/14/2018 FINDINGS: Progression of left effusion compared to the prior study. Left pleural drainage catheter remains in place unchanged. No pneumothorax. Compressive atelectasis or infiltrate throughout the left lung has progressed Right lung clear.  Port-A-Cath tip SVC. IMPRESSION: Interval progression of left effusion and airspace disease on the left likely due to metastatic disease. Electronically Signed   By: Juanda Crumble  Carlis Abbott M.D.   On: 07/27/2018 15:39    Procedures Procedures  (including critical care time)  Medications Ordered in ED Medications  sodium chloride (PF) 0.9 % injection (has no administration in time range)  apixaban (ELIQUIS) tablet 10 mg (has no administration in time range)    Followed by  apixaban (ELIQUIS) tablet 5 mg (has no administration in time range)  HYDROmorphone (DILAUDID) injection 1 mg (has no administration in time range)  magnesium sulfate IVPB 2 g 50 mL (has no administration in time range)  0.9 % NaCl with KCl 40 mEq / L  infusion (has no administration in time range)  oxyCODONE (Oxy IR/ROXICODONE) immediate release tablet 5 mg (has no administration in time range)  acetaminophen (TYLENOL) tablet 650 mg (has no administration in time range)    Or  acetaminophen (TYLENOL) suppository 650 mg (has no administration in time range)  prochlorperazine (COMPAZINE) injection 5 mg (has no administration in time range)  sodium chloride 0.9 % bolus 1,000 mL (0 mLs Intravenous Stopped 07/27/18 1750)  HYDROmorphone (DILAUDID) injection 0.5 mg (0.5 mg Intravenous Given 07/27/18 1600)  HYDROmorphone (DILAUDID) injection 0.5 mg (0.5 mg Intravenous Given 07/27/18 1748)  iohexol (OMNIPAQUE) 350 MG/ML injection 100 mL (100 mLs Intravenous Contrast Given 07/27/18 1759)  potassium chloride SA (K-DUR) CR tablet 40 mEq (40 mEq Oral Given 07/27/18 1901)     Initial Impression / Assessment and Plan / ED Course  I have reviewed the triage vital signs and the nursing notes.  Pertinent labs & imaging results that were available during my care of the patient were reviewed by me and considered in my medical decision making (see chart for details).        Patient feels improved after IV pain control.  Her labs are fairly near baseline.  With this new type of chest pain, work-up for PE obtained and because of the elevated d-dimer a CT was obtained.  This shows a single pulmonary embolus.  There is also the large pleural effusion.  We will try to drain it though  having a hard time getting the supplies.  She is not in distress or hypoxic.  There is also questionable pneumonia though clinically she is not having different cough or fever.  I discussed with Dr. Olevia Bowens, for now will place on Eliquis and will admit for supportive care and further treatment.  Deborah Mcintyre was evaluated in Emergency Department on 07/27/2018 for the symptoms described in the history of present illness. She was evaluated in the context of the global COVID-19 pandemic, which necessitated consideration that the patient might be at risk for infection with the SARS-CoV-2 virus that causes COVID-19. Institutional protocols and algorithms that pertain to the evaluation of patients at risk for COVID-19 are in a state of rapid change based on information released by regulatory bodies including the CDC and federal and state organizations. These policies and algorithms were followed during the patient's care in the ED.   Final Clinical Impressions(s) / ED Diagnoses   Final diagnoses:  Pleural effusion on left  Single subsegmental pulmonary embolism without acute cor pulmonale    ED Discharge Orders    None       Sherwood Gambler, MD 07/27/18 2008

## 2018-07-27 NOTE — ED Notes (Signed)
Port accessed, per pt request.  Aseptic technique used, port flushed easily, good blood return noted.

## 2018-07-27 NOTE — ED Triage Notes (Signed)
Pt BIBA from home c/o [facility] x3 days.    Per EMS-  Pt getting chemo tx for breast and lung cancer.  Has chest tube for drainage, home health RN called EMS d/t tube not draining properly.    Per EMS- pt having irregular heartbeat- afib?- for 5-10 secs, then resumes ST.

## 2018-07-27 NOTE — ED Notes (Signed)
ED TO INPATIENT HANDOFF REPORT  Name/Age/Gender Deborah Mcintyre 63 y.o. female  Code Status    Code Status Orders  (From admission, onward)         Start     Ordered   07/27/18 1957  Full code  Continuous     07/27/18 1958        Code Status History    Date Active Date Inactive Code Status Order ID Comments User Context   07/14/2018 1846 07/18/2018 1816 Full Code [id number]  Kayleen Memos, DO ED   06/30/2018 1623 07/02/2018 1950 Full Code [id number]  Darliss Cheney, MD Inpatient   06/01/2018 2227 06/03/2018 1746 Full Code [id number]  Toy Baker, MD Inpatient   05/27/2018 2223 05/29/2018 1957 Full Code [id number]  Gwynne Edinger, MD Inpatient   05/12/2018 0220 05/15/2018 1922 DNR [id number]  Lenore Cordia, MD ED   02/04/2017 2236 02/10/2017 1447 Full Code [id number]  [name], MD ED   08/23/2015 1551 08/24/2015 1244 Full Code [id number]  Alphonsa Overall, MD Inpatient   08/03/2013 2300 08/07/2013 1602 Full Code [id number]  [name], MD ED   Advance Care Planning Activity      Home/SNF/Other Home  Chief Complaint SOB and Chest tube malfunction  Level of Care/Admitting Diagnosis ED Disposition    ED Disposition Condition [place] [facility]: [facility] [[id number]]  Level of Care: Stepdown [14]  Admit to SDU based on following criteria: Severe physiological/psychological symptoms:  Any diagnosis requiring assessment & intervention at least every 4 hours on an ongoing basis to obtain desired patient outcomes including stability and rehabilitation  Admit to SDU based on following criteria: Cardiac Instability:  Patients experiencing chest pain, unconfirmed MI and stable, arrhythmias and CHF requiring medical management and potentially compromising patient's stability  Covid Evaluation: Asymptomatic Screening Protocol (No Symptoms)  Diagnosis: Pulmonary emboli [facility]) [[id number]]  Admitting Physician: Reubin Milan [[id number]]  Attending Physician:  Reubin Milan [[id number]]  Estimated length of stay: past midnight tomorrow  Certification:: I certify this patient will need inpatient services for at least 2 midnights  PT Class (Do Not Modify): Inpatient [101]  PT Acc Code (Do Not Modify): Private [1]       Medical History Past Medical History:  Diagnosis Date  . Anxiety   . Arthritis   . Bronchitis   . COPD (chronic obstructive pulmonary disease) ([place])   . Depression   . Hypertension   . Malignant neoplasm of upper-outer quadrant of left female breast ([place]) 01/04/2015  . Nocturia   . Numbness and tingling    toes - bilateral  . Pneumonia   . PONV (postoperative nausea and vomiting)    Nausea    Allergies Allergies  Allergen Reactions  . Fentanyl Nausea And Vomiting and Other (See Comments)    Headache  . Morphine And Related Nausea And Vomiting and Other (See Comments)    Headache    IV Location/Drains/Wounds Patient Lines/Drains/Airways Status   Active Line/Drains/Airways    Name:   Placement date:   Placement time:   Site:   Days:   Implanted Port 05/25/18 Right Chest   05/25/18    1257    Chest   63   Closed System Drain 1 Lateral LLQ Other (Comment)   06/02/18    1000    LLQ   55          Labs/Imaging Results for orders placed or performed during the hospital encounter of  07/27/18 (from the past 48 hour(s))  Magnesium     Status: None   Collection Time: 07/27/18  4:00 PM  Result Value Ref Range   Magnesium 1.9 1.7 - 2.4 mg/dL    Comment: Performed at [facility], [place] [address] [place], [place] [address]  CBC with Differential     Status: Abnormal   Collection Time: 07/27/18  4:02 PM  Result Value Ref Range   WBC 2.3 (L) 4.0 - 10.5 K/uL    Comment: WHITE COUNT CONFIRMED ON SMEAR   RBC 3.11 (L) 3.87 - 5.11 MIL/uL   Hemoglobin 8.6 (L) 12.0 - 15.0 g/dL   HCT 29.1 (L) 36.0 - 46.0 %   MCV 93.6 80.0 - 100.0 fL   MCH 27.7 26.0 - 34.0 pg   MCHC 29.6 (L) 30.0 - 36.0 g/dL   RDW  15.7 (H) 11.5 - 15.5 %   Platelets 355 150 - 400 K/uL   nRBC 0.0 0.0 - 0.2 %   Neutrophils Relative % 53 %   Lymphocytes Relative 27 %   Monocytes Relative 12 %   Eosinophils Relative 0 %   Basophils Relative 0 %   Band Neutrophils 8 %   Metamyelocytes Relative 0 %   Myelocytes 0 %   Promyelocytes Relative 0 %   Blasts 0 %   nRBC 0 0 /100 WBC   Other 0 %   Neutro Abs 1.4 (L) 1.7 - 7.7 K/uL   Lymphs Abs 0.6 (L) 0.7 - 4.0 K/uL   Monocytes Absolute 0.3 0.1 - 1.0 K/uL   Eosinophils Absolute 0.0 0.0 - 0.5 K/uL   Basophils Absolute 0.0 0.0 - 0.1 K/uL   Smear Review MORPHOLOGY UNREMARKABLE     Comment: Performed at [facility], [place] [address], [place],  [address]  Comprehensive metabolic panel     Status: Abnormal   Collection Time: 07/27/18  4:02 PM  Result Value Ref Range   Sodium 143 135 - 145 mmol/L   Potassium 3.0 (L) 3.5 - 5.1 mmol/L   Chloride 100 98 - 111 mmol/L   CO2 30 22 - 32 mmol/L   Glucose, Bld 94 70 - 99 mg/dL   BUN 6 (L) 8 - 23 mg/dL   Creatinine, Ser 0.54 0.44 - 1.00 mg/dL   Calcium 8.6 (L) 8.9 - 10.3 mg/dL   Total Protein 6.2 (L) 6.5 - 8.1 g/dL   Albumin 2.6 (L) 3.5 - 5.0 g/dL   AST 13 (L) 15 - 41 U/L   ALT 7 0 - 44 U/L   Alkaline Phosphatase 56 38 - 126 U/L   Total Bilirubin 0.5 0.3 - 1.2 mg/dL   GFR calc non Af Amer >60 >60 mL/min   GFR calc Af Amer >60 >60 mL/min   Anion gap 13 5 - 15    Comment: Performed at [facility], [place] [address], [place], [place] [address]  Lactic acid, plasma     Status: None   Collection Time: 07/27/18  4:02 PM  Result Value Ref Range   Lactic Acid, Venous 1.2 0.5 - 1.9 mmol/L    Comment: Performed at [facility] [place] [facility], [place] [address], [place], [place] [address]  Troponin I (High Sensitivity)     Status: None   Collection Time: 07/27/18  4:02 PM  Result Value Ref Range   Troponin I (High Sensitivity) 4 <18 ng/L    Comment: (NOTE) Elevated high sensitivity troponin  I (hsTnI) values and  significant  changes across serial measurements may suggest ACS but many other  chronic and acute conditions are known to elevate hsTnI results.  Refer to the "Links" section for chest pain algorithms and additional  guidance. Performed at [facility], [place] [address] [place], [place] [address]   D-dimer, quantitative     Status: Abnormal   Collection Time: 07/27/18  4:02 PM  Result Value Ref Range   D-Dimer, Quant 4.69 (H) 0.00 - 0.50 ug/mL-FEU    Comment: (NOTE) At the manufacturer cut-off of 0.50 ug/mL FEU, this assay has been documented to exclude PE with a sensitivity and negative predictive value of 97 to 99%.  At this time, this assay has not been approved by the FDA to exclude DVT/VTE. Results should be correlated with clinical presentation. Performed at [facility], [place] [address], [place], [place] [address]   Lactic acid, plasma     Status: None   Collection Time: 07/27/18  5:50 PM  Result Value Ref Range   Lactic Acid, Venous 0.9 0.5 - 1.9 mmol/L    Comment: Performed at [facility], [place] [address], [place], [place] [address]  Troponin I (High Sensitivity)     Status: None   Collection Time: 07/27/18  5:50 PM  Result Value Ref Range   Troponin I (High Sensitivity) 5 <18 ng/L    Comment: (NOTE) Elevated high sensitivity troponin I (hsTnI) values and significant  changes across serial measurements may suggest ACS but many other  chronic and acute conditions are known to elevate hsTnI results.  Refer to the "Links" section for chest pain algorithms and additional  guidance. Performed at [facility], [place] [address] [place], [place] [address]    Ct Angio Chest Pe W And/or Wo Contrast  Result Date: 07/27/2018 CLINICAL DATA:  Breast and lung cancer, chest tube, shortness of breath for 3 days, tube not draining properly, elevated D-dimer, question pulmonary embolism EXAM:  CT ANGIOGRAPHY CHEST WITH CONTRAST TECHNIQUE: Multidetector CT imaging of the chest was performed using the standard protocol during bolus administration of intravenous contrast. Multiplanar CT image reconstructions and MIPs were obtained to evaluate the vascular anatomy. CONTRAST:  139mL OMNIPAQUE IOHEXOL 350 MG/ML SOLN COMPARISON:  06/30/2018 FINDINGS: Cardiovascular: Atherosclerotic calcifications of thoracic aorta. Ascending aorta 4.0 cm transverse. No evidence of dissection. No pericardial effusion. Pulmonary arteries adequately opacified. Small pulmonary embolus identified within a RIGHT lower lobe pulmonary artery, medial basal segment. No additional pulmonary emboli are definitely visualized. RIGHT jugular Port-A-Cath with tip in SVC. Few additional normal sized Mediastinum/Nodes: Probable enlarged precarinal lymph node 15 mm short axis image 33. 13 mm short axis AP window node image 36. Subcarinal adenopathy 23 mm short axis previously 22 mm. Esophagus unremarkable. Base of cervical region normal appearance. No axillary adenopathy. Prior LEFT mastectomy. Lungs/Pleura: Large loculated LEFT pleural effusion despite thoracostomy tube. Complete collapse of the LEFT upper lobe with abrupt cut off of air within LEFT upper lobe bronchus. Mild compressive atelectasis of LEFT lower lobe with narrowing of LEFT lower lobe airways and peribronchial thickening. Patchy areas of opacity in periphery of RIGHT lung, greater in RIGHT lower lobe, favor pneumonia though attention on follow-up recommended to exclude developing tumor. No pneumothorax. No RIGHT pleural effusion. Upper Abdomen: 14 mm short axis gastrohepatic ligament lymph node image 90. Visualized upper abdomen otherwise unremarkable Musculoskeletal: No acute osseous findings. Review of the MIP images confirms the above findings. IMPRESSION: Small pulmonary embolus in RIGHT lower lobe  medially. Large loculated RIGHT pleural effusion with complete atelectasis of  LEFT upper lobe and partial atelectasis of LEFT lower lobe. Patchy opacities in RIGHT lung favor pneumonia, slightly increased in RIGHT lower lobe since previous exam, recommend follow-up until resolution to exclude underlying mass. Persistent mediastinal adenopathy. Mildly enlarged gastrohepatic ligament lymph node. Aortic Atherosclerosis (ICD10-I70.0). Findings called to Dr. Regenia Skeeter on 07/27/2018 at 1904 hrs. Electronically Signed   By: Lavonia Dana M.D.   On: 07/27/2018 19:05   Dg Chest Portable 1 View  Result Date: 07/27/2018 CLINICAL DATA:  Dyspnea and short of breath. History of breast cancer and lung cancer. Chest tube EXAM: PORTABLE CHEST 1 VIEW COMPARISON:  07/14/2018 FINDINGS: Progression of left effusion compared to the prior study. Left pleural drainage catheter remains in place unchanged. No pneumothorax. Compressive atelectasis or infiltrate throughout the left lung has progressed Right lung clear.  Port-A-Cath tip SVC. IMPRESSION: Interval progression of left effusion and airspace disease on the left likely due to metastatic disease. Electronically Signed   By: Franchot Gallo M.D.   On: 07/27/2018 15:39    Pending Labs Unresulted Labs (From admission, onward)    Start     Ordered   07/28/18 0500  CBC WITH DIFFERENTIAL  Daily,   R     07/27/18 1958   07/28/18 0500  Comprehensive metabolic panel  Daily,   R     07/27/18 2005   07/27/18 1922  SARS Coronavirus 2 (CEPHEID - Performed in [place] hospital lab), Hosp Order  (Asymptomatic Patients Labs)  Once,   STAT    Question:  Rule Out  Answer:  Yes   07/27/18 1922   07/27/18 1519  Culture, blood (routine x 2)  BLOOD CULTURE X 2,   STAT     07/27/18 1518          Vitals/Pain Today's Vitals   07/27/18 1700 07/27/18 1730 07/27/18 1902 07/27/18 1903  BP: (!) 137/92 (!) 127/93 (!) 129/99   Pulse: (!) 106 (!) 110 (!) 116   Resp: (!) 21 (!) 22 (!) 25   Temp:      TempSrc:      SpO2:  96% 96%   Weight:      Height:       PainSc:    3     Isolation Precautions No active isolations  Medications Medications  sodium chloride (PF) 0.9 % injection (has no administration in time range)  apixaban (ELIQUIS) tablet 10 mg (has no administration in time range)    Followed by  apixaban (ELIQUIS) tablet 5 mg (has no administration in time range)  HYDROmorphone (DILAUDID) injection 1 mg (has no administration in time range)  magnesium sulfate IVPB 2 g 50 mL (has no administration in time range)  0.9 % NaCl with KCl 40 mEq / L  infusion (has no administration in time range)  oxyCODONE (Oxy IR/ROXICODONE) immediate release tablet 5 mg (has no administration in time range)  acetaminophen (TYLENOL) tablet 650 mg (has no administration in time range)    Or  acetaminophen (TYLENOL) suppository 650 mg (has no administration in time range)  prochlorperazine (COMPAZINE) injection 5 mg (has no administration in time range)  sodium chloride 0.9 % bolus 1,000 mL (0 mLs Intravenous Stopped 07/27/18 1750)  HYDROmorphone (DILAUDID) injection 0.5 mg (0.5 mg Intravenous Given 07/27/18 1600)  HYDROmorphone (DILAUDID) injection 0.5 mg (0.5 mg Intravenous Given 07/27/18 1748)  iohexol (OMNIPAQUE) 350 MG/ML injection 100 mL (100 mLs Intravenous Contrast Given 07/27/18 1759)  potassium chloride SA (K-DUR) CR tablet 40 mEq (40 mEq Oral Given 07/27/18 1901)    Mobility walks

## 2018-07-27 NOTE — H&P (Signed)
History and Physical    Deborah Mcintyre:[id number] DOB: 30-Nov-1955 DOA: 07/27/2018  PCP: [name], MD   Patient coming from: Home.  I have personally briefly reviewed patient's old medical records in [place]  Chief Complaint: Chest tube not draining.  HPI: Deborah Mcintyre is a 63 y.o. female with medical history significant of anxiety, depression, osteoarthritis, bronchitis, COPD, hypertension, nocturia, bilateral tingling and numbness of toes, history of healthcare associated pneumonia at discharged 9 days ago, history of breast cancer who had a chest tube placement due to a left-sided malignant pleural effusion and is coming to the emergency department due to his lack of drainage associated with 3 days of progressively worse dyspnea and intermittent chest pain.  EMS reported that the patient has sinus tachycardia but went momentarily into A. fib prior to arrival.  Patient denies fever, chills, but feels fatigued, decreased appetite and sleep.  She denies rhinorrhea, sore throat, or hemoptysis.  She has occasional wheezing.  Occasional postural dizziness, no palpitations, diaphoresis, PND, orthopnea or pitting edema of the lower extremities.  Her chest pain is pleuritic.  Denies abdominal pain, nausea, emesis, diarrhea, constipation, melena or hematochezia.  No dysuria, frequency or hematuria.  No polyuria, polydipsia, polyphagia or blurred vision..  ED Course: Initial vital signs were temperature 98.2 F, pulse 116, respirations 24, blood pressure 105/81 mmHg and O2 sat 98% on room air.  The patient was given hydromorphone, oral potassium and 1000 mL of NS bolus.  She was started on Eliquis.  CBC shows a white count of 2.3, hemoglobin 8.6 g/dL and platelets 355.  D-dimer was 4.69 mcg/mL.  CMP shows potassium of 3.0 mmol/L.  All other electrolytes are normal when calcium is corrected to albumin.  Renal function is within expected values.  Total protein 6.2 and albumin 2.6 g/dL,  the rest of the hepatic functions are unremarkable.  Imaging: CTA chest shows small pulmonary embolus in right lower lobe medially.  Large loculated right pleural effusion with complete atelectasis of left upper lobe and partial atelectasis of left lower lobe.  Patchy opacities in right lung favoring pneumonia, slightly increasing right lower lobe since previous exam.  Please see images for full radiology report for further detail.  Review of Systems: As per HPI otherwise 10 point review of systems negative.   Past Medical History:  Diagnosis Date   Anxiety    Arthritis    Bronchitis    COPD (chronic obstructive pulmonary disease) ([place])    Depression    Hypertension    Malignant neoplasm of upper-outer quadrant of left female breast ([place]) 01/04/2015   Nocturia    Numbness and tingling    toes - bilateral   Pneumonia    PONV (postoperative nausea and vomiting)    Nausea    Past Surgical History:  Procedure Laterality Date   BREAST LUMPECTOMY Bilateral    x3   BREAST LUMPECTOMY WITH RADIOACTIVE SEED LOCALIZATION Right 08/23/2015   Procedure: RIGHT BREAST LUMPECTOMY WITH RADIOACTIVE SEED LOCALIZATION;  Surgeon: Alphonsa Overall, MD;  Location: [facility];  Service: General;  Laterality: Right;   CESAREAN SECTION     x4   IR GUIDED DRAIN W CATHETER PLACEMENT  06/02/2018   IR IMAGING GUIDED PORT INSERTION  05/25/2018   IR REMOVAL TUN ACCESS W/ PORT W/O FL MOD SED  10/20/2016   IR THORACENTESIS ASP PLEURAL SPACE W/IMG GUIDE  05/12/2018   IR THORACENTESIS ASP PLEURAL SPACE W/IMG GUIDE  07/07/2018   MASTECTOMY Left 08/23/2015  RIGHT BREAST LUMPECTOMY WITH RADIOACTIVE SEED LOCALIZATION,  LEFT MASTECTOMY WITH AXILLARY LYMPH NODE DISSECTION   MASTECTOMY WITH AXILLARY LYMPH NODE DISSECTION Left 08/23/2015   Procedure: LEFT MASTECTOMY WITH AXILLARY LYMPH NODE DISSECTION;  Surgeon: Alphonsa Overall, MD;  Location: [facility];  Service: General;  Laterality: Left;   MULTIPLE TOOTH EXTRACTIONS        reports that she has been smoking cigarettes. She has a 33.75 pack-year smoking history. She has never used smokeless tobacco. She reports current alcohol use. She reports that she does not use drugs.  Allergies  Allergen Reactions   Fentanyl Nausea And Vomiting and Other (See Comments)    Headache   Morphine And Related Nausea And Vomiting and Other (See Comments)    Headache    Family History  Problem Relation Age of Onset   Breast cancer Maternal Aunt    Prior to Admission medications   Medication Sig Start Date End Date Taking? Authorizing Provider  albuterol (PROVENTIL HFA;VENTOLIN HFA) 108 (90 Base) MCG/ACT inhaler Inhale 2 puffs into the lungs every 6 (six) hours as needed for wheezing or shortness of breath.    Yes [provider]  albuterol (PROVENTIL) (2.5 MG/3ML) 0.083% nebulizer solution Take 3 mLs (2.5 mg total) by nebulization every 6 (six) hours as needed for up to 30 days for wheezing or shortness of breath. 07/02/18 08/01/18 Yes Alma Friendly, MD  amLODipine (NORVASC) 5 MG tablet Take 1 tablet (5 mg total) by mouth daily. 05/16/18  Yes Regalado, Belkys A, MD  aspirin EC 81 MG tablet Take 81 mg by mouth daily.   Yes [provider]  dronabinol (MARINOL) 2.5 MG capsule Take 1 capsule (2.5 mg total) by mouth 2 (two) times daily before a meal. 07/21/18  Yes Causey, Charlestine Massed, NP  gabapentin (NEURONTIN) 400 MG capsule Take 1 capsule (400 mg total) by mouth 3 (three) times daily. 07/17/18  Yes Kayleen Memos, DO  lidocaine-prilocaine (EMLA) cream Apply to affected area once Patient taking differently: Apply 1 application topically daily as needed (port access).  05/19/18  Yes Nicholas Lose, MD  metoprolol tartrate (LOPRESSOR) 25 MG tablet Take 1 tablet (25 mg total) by mouth 2 (two) times daily. 06/02/18  Yes Georgette Shell, MD  Multiple Vitamin (MULTIVITAMIN WITH MINERALS) TABS tablet Take 1 tablet by mouth daily. 07/18/18  Yes Hall,  Carole N, DO  nicotine (NICODERM CQ - DOSED IN MG/24 HOURS) 21 mg/24hr patch Place 1 patch (21 mg total) onto the skin daily. 07/19/18  Yes Nicholas Lose, MD  nystatin (MYCOSTATIN) 100000 UNIT/ML suspension Use as directed 5 mLs (500,000 Units total) in the mouth or throat 4 (four) times daily. 07/17/18  Yes Hall, Carole N, DO  ondansetron (ZOFRAN) 8 MG tablet Take 1 tablet (8 mg total) by mouth every 8 (eight) hours as needed for nausea. 09/23/17  Yes Nicholas Lose, MD  oxyCODONE-acetaminophen (PERCOCET/ROXICET) 5-325 MG tablet Take 1 tablet by mouth every 8 (eight) hours as needed for moderate pain or severe pain (1 tab every 4-6 hour prn pain). 07/19/18  Yes Nicholas Lose, MD  PARoxetine (PAXIL) 20 MG tablet Take 1 tablet (20 mg total) by mouth 2 (two) times daily. 05/15/18  Yes Regalado, Belkys A, MD  potassium chloride (K-DUR) 10 MEQ tablet Take 2 tablets (20 mEq total) by mouth daily. 07/21/18  Yes Causey, Charlestine Massed, NP  ALPRAZolam Duanne Moron) 0.5 MG tablet Take 1 tablet (0.5 mg total) by mouth 3 (three) times daily as needed for  anxiety or sleep. Patient not taking: Reported on 07/27/2018 05/15/18   Regalado, Jerald Kief A, MD  benzonatate (TESSALON) 200 MG capsule Take 1 capsule (200 mg total) by mouth 2 (two) times daily as needed for cough. Patient not taking: Reported on 07/27/2018 07/17/18   Kayleen Memos, DO  dexamethasone (DECADRON) 1 MG tablet Take 1 tablet (1 mg total) by mouth daily. Patient not taking: Reported on 07/27/2018 06/17/18   Nicholas Lose, MD  PREMARIN vaginal cream INSERT 1 APPLICATORFUL VAGINALLY DAILY Patient taking differently: Place 1 Applicatorful vaginally daily as needed (dryness).  04/19/17   Nicholas Lose, MD  prochlorperazine (COMPAZINE) 10 MG tablet Take 1 tablet (10 mg total) by mouth every 6 (six) hours as needed (Nausea or vomiting). Patient taking differently: Take 10 mg by mouth every 6 (six) hours as needed for nausea or vomiting.  05/19/18   Nicholas Lose, MD    promethazine (PHENERGAN) 12.5 MG tablet Take 1 tablet (12.5 mg total) by mouth every 6 (six) hours as needed for nausea or vomiting. 07/19/18   Nicholas Lose, MD    Physical Exam: Vitals:   07/27/18 1730 07/27/18 1902 07/27/18 1930 07/27/18 2036  BP: (!) 127/93 (!) 129/99 (!) 139/98 97/82  Pulse: (!) 110 (!) 116 (!) 111 (!) 110  Resp: (!) 22 (!) 25 (!) 24 (!) 23  Temp:      TempSrc:      SpO2: 96% 96% 95% 98%  Weight:      Height:        Constitutional: Looks chronically ill, but currently in NAD, calm, comfortable Eyes: PERRL, lids and conjunctivae normal ENMT: Mucous membranes are moist. Posterior pharynx clear of any exudate or lesions. Neck: normal, supple, no masses, no thyromegaly Respiratory: Decreased breath sounds bilaterally L >R, no wheezing, no crackles. Normal respiratory effort. No accessory muscle use.   Chest: Positive left chest tube and right upper port. Cardiovascular: Tachycardic at 108 bpm, no murmurs / rubs / gallops. No extremity edema. 2+ pedal pulses. No carotid bruits.  Abdomen: Soft, no tenderness, no masses palpated. No hepatosplenomegaly. Bowel sounds positive.  Musculoskeletal: no clubbing / cyanosis. Good ROM, no contractures. Normal muscle tone.  Skin: no rashes, lesions, ulcers on very limited dermatological examination. Neurologic: CN 2-12 grossly intact. Sensation intact, DTR normal. Strength 5/5 in all 4.  Psychiatric: Normal judgment and insight. Alert and oriented x 3. Normal mood.   Labs on Admission: I have personally reviewed following labs and imaging studies  CBC: Recent Labs  Lab 07/21/18 1215 07/27/18 1602  WBC 15.8* 2.3*  NEUTROABS 12.2* 1.4*  HGB 9.2* 8.6*  HCT 30.5* 29.1*  MCV 90.8 93.6  PLT 313 951   Basic Metabolic Panel: Recent Labs  Lab 07/21/18 1215 07/27/18 1600 07/27/18 1602  NA 144  --  143  K 3.2*  --  3.0*  CL 102  --  100  CO2 30  --  30  GLUCOSE 106*  --  94  BUN 7*  --  6*  CREATININE 0.59  --  0.54   CALCIUM 8.4*  --  8.6*  MG  --  1.9  --    GFR: Estimated Creatinine Clearance: 71.4 mL/min (by C-G formula based on SCr of 0.54 mg/dL). Liver Function Tests: Recent Labs  Lab 07/21/18 1215 07/27/18 1602  AST 12* 13*  ALT <6 7  ALKPHOS 91 56  BILITOT 0.2* 0.5  PROT 5.9* 6.2*  ALBUMIN 2.2* 2.6*   No results for input(s): LIPASE,  AMYLASE in the last 168 hours. No results for input(s): AMMONIA in the last 168 hours. Coagulation Profile: No results for input(s): INR, PROTIME in the last 168 hours. Cardiac Enzymes: No results for input(s): CKTOTAL, CKMB, CKMBINDEX, TROPONINI in the last 168 hours. BNP (last 3 results) No results for input(s): PROBNP in the last 8760 hours. HbA1C: No results for input(s): HGBA1C in the last 72 hours. CBG: No results for input(s): GLUCAP in the last 168 hours. Lipid Profile: No results for input(s): CHOL, HDL, LDLCALC, TRIG, CHOLHDL, LDLDIRECT in the last 72 hours. Thyroid Function Tests: No results for input(s): TSH, T4TOTAL, FREET4, T3FREE, THYROIDAB in the last 72 hours. Anemia Panel: No results for input(s): VITAMINB12, FOLATE, FERRITIN, TIBC, IRON, RETICCTPCT in the last 72 hours. Urine analysis:    Component Value Date/Time   COLORURINE YELLOW 12/06/2014 2207   APPEARANCEUR CLOUDY (A) 12/06/2014 2207   LABSPEC 1.025 12/06/2014 2207   PHURINE 5.5 12/06/2014 2207   GLUCOSEU NEGATIVE 12/06/2014 2207   HGBUR MODERATE (A) 12/06/2014 2207   BILIRUBINUR NEGATIVE 12/06/2014 2207   KETONESUR NEGATIVE 12/06/2014 2207   PROTEINUR NEGATIVE 12/06/2014 2207   UROBILINOGEN 1.0 07/30/2013 1527   NITRITE POSITIVE (A) 12/06/2014 2207   LEUKOCYTESUR SMALL (A) 12/06/2014 2207    Radiological Exams on Admission: Ct Angio Chest Pe W And/or Wo Contrast  Result Date: 07/27/2018 CLINICAL DATA:  Breast and lung cancer, chest tube, shortness of breath for 3 days, tube not draining properly, elevated D-dimer, question pulmonary embolism EXAM: CT  ANGIOGRAPHY CHEST WITH CONTRAST TECHNIQUE: Multidetector CT imaging of the chest was performed using the standard protocol during bolus administration of intravenous contrast. Multiplanar CT image reconstructions and MIPs were obtained to evaluate the vascular anatomy. CONTRAST:  138mL OMNIPAQUE IOHEXOL 350 MG/ML SOLN COMPARISON:  06/30/2018 FINDINGS: Cardiovascular: Atherosclerotic calcifications of thoracic aorta. Ascending aorta 4.0 cm transverse. No evidence of dissection. No pericardial effusion. Pulmonary arteries adequately opacified. Small pulmonary embolus identified within a RIGHT lower lobe pulmonary artery, medial basal segment. No additional pulmonary emboli are definitely visualized. RIGHT jugular Port-A-Cath with tip in SVC. Few additional normal sized Mediastinum/Nodes: Probable enlarged precarinal lymph node 15 mm short axis image 33. 13 mm short axis AP window node image 36. Subcarinal adenopathy 23 mm short axis previously 22 mm. Esophagus unremarkable. Base of cervical region normal appearance. No axillary adenopathy. Prior LEFT mastectomy. Lungs/Pleura: Large loculated LEFT pleural effusion despite thoracostomy tube. Complete collapse of the LEFT upper lobe with abrupt cut off of air within LEFT upper lobe bronchus. Mild compressive atelectasis of LEFT lower lobe with narrowing of LEFT lower lobe airways and peribronchial thickening. Patchy areas of opacity in periphery of RIGHT lung, greater in RIGHT lower lobe, favor pneumonia though attention on follow-up recommended to exclude developing tumor. No pneumothorax. No RIGHT pleural effusion. Upper Abdomen: 14 mm short axis gastrohepatic ligament lymph node image 90. Visualized upper abdomen otherwise unremarkable Musculoskeletal: No acute osseous findings. Review of the MIP images confirms the above findings. IMPRESSION: Small pulmonary embolus in RIGHT lower lobe medially. Large loculated RIGHT pleural effusion with complete atelectasis of LEFT  upper lobe and partial atelectasis of LEFT lower lobe. Patchy opacities in RIGHT lung favor pneumonia, slightly increased in RIGHT lower lobe since previous exam, recommend follow-up until resolution to exclude underlying mass. Persistent mediastinal adenopathy. Mildly enlarged gastrohepatic ligament lymph node. Aortic Atherosclerosis (ICD10-I70.0). Findings called to Dr. Regenia Skeeter on 07/27/2018 at 1904 hrs. Electronically Signed   By: Lavonia Dana M.D.   On:  07/27/2018 19:05   Dg Chest Portable 1 View  Result Date: 07/27/2018 CLINICAL DATA:  Dyspnea and short of breath. History of breast cancer and lung cancer. Chest tube EXAM: PORTABLE CHEST 1 VIEW COMPARISON:  07/14/2018 FINDINGS: Progression of left effusion compared to the prior study. Left pleural drainage catheter remains in place unchanged. No pneumothorax. Compressive atelectasis or infiltrate throughout the left lung has progressed Right lung clear.  Port-A-Cath tip SVC. IMPRESSION: Interval progression of left effusion and airspace disease on the left likely due to metastatic disease. Electronically Signed   By: Franchot Gallo M.D.   On: 07/27/2018 15:39    EKG: Independently reviewed.  Vent. rate 106 BPM PR interval * ms QRS duration 142 ms QT/QTc 384/510 ms P-R-T axes 63 216 52 Sinus tachycardia Probable left atrial enlargement Nonspecific intraventricular conduction delay Minimal ST depression  Assessment/Plan Principal Problem:   Pulmonary embolus (HCC) Admit to stepdown/inpatient. Continue supplemental oxygen. Continue chest tube drainage. Consider CT consult if impaired drainage. No fever and normal lactic acid. Imaging not significantly changed. Defer antibiotics for now unless she becomes febrile.  Active Problems:   Antineoplastic chemotherapy induced pancytopenia (HCC) Her platelets had normalized, but continues to be anemic/neutropenic. Monitor CBC. Follow-up with oncology as scheduled.    Hypertension Continue  amlodipine 5 mg p.o. daily. Continue metoprolol 25 mg p.o. daily. Monitor BP and heart rate.    COPD (chronic obstructive pulmonary disease) (HCC) Continue supplemental oxygen. Bronchodilators as needed.    Anxiety and depression Continue paroxetine 20 mg p.o. twice daily.    Tobacco abuse Nicotine replacement therapy as needed.    Chest pain Analgesics as needed.    Hypokalemia Replacing. Follow-up potassium level in the morning.     DVT prophylaxis: Lovenox SQ. Code Status: Full code. Family Communication: Disposition Plan: Admit for IV hydration, electrolyte replacement, anticoagulation with Eliquis and resumption of chest tube drainage. Consults called: Admission status: Inpatient/stepdown.   Reubin Milan MD [facility]  If 7PM-7AM, please contact night-coverage [email or web address]  07/27/2018, 9:16 PM   This document was prepared using Dragon voice recognition software and may contain some unintended transcription errors.

## 2018-07-27 NOTE — ED Notes (Signed)
Patient transported to CT 

## 2018-07-28 ENCOUNTER — Encounter (HOSPITAL_COMMUNITY): Payer: Self-pay | Admitting: Student

## 2018-07-28 ENCOUNTER — Inpatient Hospital Stay (HOSPITAL_COMMUNITY): Payer: Medicare Other

## 2018-07-28 ENCOUNTER — Other Ambulatory Visit: Payer: Medicare Other

## 2018-07-28 ENCOUNTER — Ambulatory Visit: Payer: Medicare Other

## 2018-07-28 DIAGNOSIS — I361 Nonrheumatic tricuspid (valve) insufficiency: Secondary | ICD-10-CM

## 2018-07-28 DIAGNOSIS — I2699 Other pulmonary embolism without acute cor pulmonale: Secondary | ICD-10-CM

## 2018-07-28 DIAGNOSIS — I10 Essential (primary) hypertension: Secondary | ICD-10-CM

## 2018-07-28 DIAGNOSIS — J449 Chronic obstructive pulmonary disease, unspecified: Secondary | ICD-10-CM

## 2018-07-28 DIAGNOSIS — Z720 Tobacco use: Secondary | ICD-10-CM

## 2018-07-28 DIAGNOSIS — J910 Malignant pleural effusion: Secondary | ICD-10-CM

## 2018-07-28 HISTORY — PX: IR REMOVAL OF PLURAL CATH W/CUFF: IMG5346

## 2018-07-28 LAB — CBC WITH DIFFERENTIAL/PLATELET
Abs Immature Granulocytes: 0.14 10*3/uL — ABNORMAL HIGH (ref 0.00–0.07)
Basophils Absolute: 0.1 10*3/uL (ref 0.0–0.1)
Basophils Relative: 3 %
Eosinophils Absolute: 0 10*3/uL (ref 0.0–0.5)
Eosinophils Relative: 1 %
HCT: 28.2 % — ABNORMAL LOW (ref 36.0–46.0)
Hemoglobin: 8 g/dL — ABNORMAL LOW (ref 12.0–15.0)
Immature Granulocytes: 7 %
Lymphocytes Relative: 33 %
Lymphs Abs: 0.7 10*3/uL (ref 0.7–4.0)
MCH: 27.1 pg (ref 26.0–34.0)
MCHC: 28.4 g/dL — ABNORMAL LOW (ref 30.0–36.0)
MCV: 95.6 fL (ref 80.0–100.0)
Monocytes Absolute: 0.5 10*3/uL (ref 0.1–1.0)
Monocytes Relative: 22 %
Neutro Abs: 0.7 10*3/uL — ABNORMAL LOW (ref 1.7–7.7)
Neutrophils Relative %: 34 %
Platelets: 336 10*3/uL (ref 150–400)
RBC: 2.95 MIL/uL — ABNORMAL LOW (ref 3.87–5.11)
RDW: 15.9 % — ABNORMAL HIGH (ref 11.5–15.5)
WBC: 2.1 10*3/uL — ABNORMAL LOW (ref 4.0–10.5)
nRBC: 0 % (ref 0.0–0.2)

## 2018-07-28 LAB — COMPREHENSIVE METABOLIC PANEL
ALT: 7 U/L (ref 0–44)
AST: 13 U/L — ABNORMAL LOW (ref 15–41)
Albumin: 2.5 g/dL — ABNORMAL LOW (ref 3.5–5.0)
Alkaline Phosphatase: 49 U/L (ref 38–126)
Anion gap: 9 (ref 5–15)
BUN: <5 mg/dL — ABNORMAL LOW (ref 8–23)
CO2: 27 mmol/L (ref 22–32)
Calcium: 7.9 mg/dL — ABNORMAL LOW (ref 8.9–10.3)
Chloride: 105 mmol/L (ref 98–111)
Creatinine, Ser: 0.42 mg/dL — ABNORMAL LOW (ref 0.44–1.00)
GFR calc Af Amer: >60 mL/min (ref >60)
GFR calc non Af Amer: >60 mL/min (ref >60)
Glucose, Bld: 101 mg/dL — ABNORMAL HIGH (ref 70–99)
Potassium: 4.2 mmol/L (ref 3.5–5.1)
Sodium: 141 mmol/L (ref 135–145)
Total Bilirubin: 0.3 mg/dL (ref 0.3–1.2)
Total Protein: 6.1 g/dL — ABNORMAL LOW (ref 6.5–8.1)

## 2018-07-28 LAB — ECHOCARDIOGRAM COMPLETE
Height: 67.5 in
Weight: 2560 oz

## 2018-07-28 LAB — MRSA PCR SCREENING: MRSA by PCR: NEGATIVE

## 2018-07-28 MED ORDER — LOPERAMIDE HCL 2 MG PO CAPS
2.0000 mg | ORAL_CAPSULE | Freq: Four times a day (QID) | ORAL | Status: DC | PRN
  Administered 2018-07-28: 2 mg via ORAL
  Filled 2018-07-28: qty 1

## 2018-07-28 MED ORDER — ORAL CARE MOUTH RINSE
15.0000 mL | Freq: Two times a day (BID) | OROMUCOSAL | Status: DC
  Administered 2018-07-28 – 2018-08-03 (×11): 15 mL via OROMUCOSAL

## 2018-07-28 MED ORDER — LIDOCAINE HCL 1 % IJ SOLN
INTRAMUSCULAR | Status: DC | PRN
  Administered 2018-07-28: 5 mL

## 2018-07-28 MED ORDER — HYDROMORPHONE HCL 1 MG/ML IJ SOLN
1.0000 mg | Freq: Once | INTRAMUSCULAR | Status: AC
  Administered 2018-07-28: 1 mg via INTRAVENOUS
  Filled 2018-07-28: qty 1

## 2018-07-28 MED ORDER — PROMETHAZINE HCL 25 MG PO TABS
25.0000 mg | ORAL_TABLET | Freq: Four times a day (QID) | ORAL | Status: DC | PRN
  Administered 2018-07-28: 25 mg via ORAL
  Filled 2018-07-28: qty 1

## 2018-07-28 MED ORDER — ADULT MULTIVITAMIN W/MINERALS CH
1.0000 | ORAL_TABLET | Freq: Every day | ORAL | Status: DC
  Administered 2018-07-28 – 2018-08-04 (×8): 1 via ORAL
  Filled 2018-07-28 (×8): qty 1

## 2018-07-28 MED ORDER — POTASSIUM CHLORIDE IN NACL 20-0.9 MEQ/L-% IV SOLN
INTRAVENOUS | Status: AC
  Administered 2018-07-28: 01:00:00 via INTRAVENOUS
  Filled 2018-07-28: qty 1000

## 2018-07-28 MED ORDER — ENSURE ENLIVE PO LIQD
237.0000 mL | Freq: Two times a day (BID) | ORAL | Status: DC
  Administered 2018-07-29 – 2018-07-31 (×5): 237 mL via ORAL

## 2018-07-28 MED ORDER — OXYCODONE HCL 5 MG PO TABS
10.0000 mg | ORAL_TABLET | ORAL | Status: DC | PRN
  Administered 2018-07-28 (×2): 10 mg via ORAL
  Administered 2018-07-29: 5 mg via ORAL
  Administered 2018-07-29: 10 mg via ORAL
  Administered 2018-07-29: 5 mg via ORAL
  Administered 2018-07-30 – 2018-08-02 (×9): 10 mg via ORAL
  Filled 2018-07-28 (×16): qty 2

## 2018-07-28 MED ORDER — CHLORHEXIDINE GLUCONATE CLOTH 2 % EX PADS
6.0000 | MEDICATED_PAD | Freq: Every day | CUTANEOUS | Status: DC
  Administered 2018-07-28 – 2018-07-31 (×3): 6 via TOPICAL

## 2018-07-28 MED ORDER — PRO-STAT SUGAR FREE PO LIQD
30.0000 mL | Freq: Two times a day (BID) | ORAL | Status: DC
  Administered 2018-07-28 – 2018-08-04 (×11): 30 mL via ORAL
  Filled 2018-07-28 (×11): qty 30

## 2018-07-28 MED ORDER — LIDOCAINE HCL 1 % IJ SOLN
INTRAMUSCULAR | Status: AC
  Filled 2018-07-28: qty 20

## 2018-07-28 NOTE — Progress Notes (Signed)
Bilateral lower extremity venous duplex has been completed. Preliminary results can be found in CV Proc through chart review.   07/28/18 11:31 AM Deborah Mcintyre RVT

## 2018-07-28 NOTE — Consult Note (Signed)
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  Admitted with acute pulmonary emboli but also noted to have decreased pleural drainage over the last 3 weeks.  Pulmonary asked to evaluate to make recommendations in regards to Pleurx management  History of present illness   This is a 63 year old black female w/ sig history of stage IV breast cancer with metastasis to lung and pleural space.  Has had a rather chronic loculated left effusion Pleurx drain since May 2020.  She reports at baseline she would drain her chest when she would notice worsening shortness of breath.  On average up to about 3 weeks ago this would be about twice a week, and each time she would get a volume of about 500 mL.  Over the last 3 weeks the drainage from her chest has been much less, she estimates about 100 mL in total.  She is attempted to drain her chest for shortness of breath relief however drainage of such small amounts has not yielded much relief.  The only relief she has had has occurred with supplemental oxygen at home or during the intermittent time she has been hospitalized in the last several months.  She presents to the emergency room on 7/22 with chief complaint of new chest discomfort.  She had rather chronic chest discomfort which is been pleuritic in nature mostly located over the left upper chest.  Over the last 3 days prior to admission on 7/23 she noted new chest discomfort this was primarily substernal and associated with worsening shortness of breath and because of this she presented to the emergency room.  She denied new fever, sick exposure, cough worse than baseline, lower  extremity swelling, palpitations, nausea or vomiting.  In the ER she was found to be afebrile.  Room air saturations were 98%.  CT angiogram was obtained and this demonstrated a small right lower lobe pulmonary embolus, it again demonstrated a large loculated left pleural effusion And also complete collapse of the left upper lobe with abrupt cut off of the left upper lobe bronchus.  She was admitted to internal medicine service, started on DO [facility] for her acute pulmonary emboli, and pulmonary has been asked to see to make recommendations on her current indwelling Pleurx catheter.  Past Medical History  COPD, stage IV breast cancer w/ metastasis to lung and pleura (on palliative halaven q 2 weeks), HTN, recurrent PNA (post-obstructive).   Significant Hospital Events   7/22 admitted with acute pulmonary emboli  Consults:  Pulmonary consulted 7/23  Procedures:    Significant Diagnostic Tests:  CT chest 7/22:This demonstrates small pulmonary embolus involving the right lower lobe medially, there is complete collapse of the left upper lobe with abrupt cut off of the left upper lobe bronchus.  There is also mild compressive atelectasis of the left lower lobe with narrowing of that airway.  There are some patchy areas of opacity in the right lung, raising question about possible pulmonary infarct.  There is a large loculated left pleural effusion  Micro Data:  Blood cultures times two 7/22>>> COVID-19: 7/22: Negative Antimicrobials:    Interim history/subjective:  Feels better on oxygen  Objective  Blood pressure (Abnormal) 133/95, pulse (Abnormal) 108, temperature 97.9 F (36.6 C), temperature source Oral, resp. rate (Abnormal) 27, height 5' 7.5" (1.715 m), weight 72.6 kg, SpO2 100 %.        Intake/Output Summary (Last 24 hours) at 07/28/2018 0909 Last data filed at 07/28/2018 0800 Gross per 24 hour  Intake 2633.89 ml  Output no documentation  Net 2633.89 ml   Filed Weights   07/27/18  1459  Weight: 72.6 kg    Examination: General: Chronically ill-appearing 62 year old black female resting in bed currently in no acute distress HENT: Normocephalic atraumatic no jugular venous distention mucous membranes are moist sclera are nonicteric Lungs: Clear to auscultation but diminished on the left greater than the right no accessory use Cardiovascular: Regular rate and rhythm Abdomen: Soft nontender Extremities: Brisk capillary refill no edema Neuro: Awake alert oriented GU: Voids  Resolved Hospital Problem list     Assessment & Plan:   Acute on chronic dyspnea Acute Pulmonary emboli Probable pulmonary infarct involving the RLL Stage IV breast cancer w/ metastasis to lung and pleural  Metastatic lung mass w/ associated lung collapse involving the LUL Loculated left effusion COPD FTT   Acute on chronic dyspnea in the setting of acute pulmonary emboli superimposed on underlying malignancy, breast cancer with lung metastasis and malignant loculated pleural effusion on the left -Pleurx drain has essentially been non-therapeutic for over 3 weeks, suspect now that the effusion is loculated & she will not have any further benefit from this.  Suspect her acute dyspnea is secondary to her pulmonary emboli.  Seems as though her biggest issue is progression of her cancer.   Plan/rec Continue to treat pulmonary emboli; will need life long anticoagulation  Supplemental oxygen Will Have IR remove her pleur-x.  Would monitor symptoms after pleur-x removal. Likely little to be gained by replacing drain as fluid now loculated and although it might remove fluid it is unlikely to re-expand that area of lung and thus not yield any symptom relief.  Needs palliative care if heme/onc in agreement     Labs   CBC: Recent Labs  Lab 07/21/18 1215 07/27/18 1602 07/28/18 0415  WBC 15.8* 2.3* 2.1*  NEUTROABS 12.2* 1.4* 0.7*  HGB 9.2* 8.6* 8.0*  HCT 30.5* 29.1* 28.2*  MCV 90.8 93.6  95.6  PLT 313 355 569    Basic Metabolic Panel: Recent Labs  Lab 07/21/18 1215 07/27/18 1600 07/27/18 1602 07/28/18 0415  NA 144  --  143 141  K 3.2*  --  3.0* 4.2  CL 102  --  100 105  CO2 30  --  30 27  GLUCOSE 106*  --  94 101*  BUN 7*  --  6* <5*  CREATININE 0.59  --  0.54 0.42*  CALCIUM 8.4*  --  8.6* 7.9*  MG  --  1.9  --   --    GFR: Estimated Creatinine Clearance: 71.4 mL/min (A) (by C-G formula based on SCr of 0.42 mg/dL (L)). Recent Labs  Lab 07/21/18 1215 07/27/18 1602 07/27/18 1750 07/28/18 0415  WBC 15.8* 2.3*  --  2.1*  LATICACIDVEN  --  1.2 0.9  --     Liver Function Tests: Recent Labs  Lab 07/21/18 1215 07/27/18 1602 07/28/18 0415  AST 12* 13* 13*  ALT 6 7 7   ALKPHOS 91 56 49  BILITOT 0.2* 0.5 0.3  PROT 5.9* 6.2* 6.1*  ALBUMIN 2.2* 2.6* 2.5*   No results for input(s): LIPASE, AMYLASE in the  last 168 hours. No results for input(s): AMMONIA in the last 168 hours.  ABG No results found for: PHART, PCO2ART, PO2ART, HCO3, TCO2, ACIDBASEDEF, O2SAT   Coagulation Profile: No results for input(s): INR, PROTIME in the last 168 hours.  Cardiac Enzymes: No results for input(s): CKTOTAL, CKMB, CKMBINDEX, TROPONINI in the last 168 hours.  HbA1C: Hgb A1c MFr Bld  Date/Time Value Ref Range Status  08/04/2013 04:14 AM 6.1 (H) <5.7 % Final    Comment:    (NOTE)                                                                       According to the ADA Clinical Practice Recommendations for 2011, when HbA1c is used as a screening test:  >=6.5%   Diagnostic of Diabetes Mellitus           (if abnormal result is confirmed) 5.7-6.4%   Increased risk of developing Diabetes Mellitus References:Diagnosis and Classification of Diabetes Mellitus,Diabetes KZSW,1093,23(FTDDU 1):S62-S69 and Standards of Medical Care in         Diabetes - 2011,Diabetes [id number],4270,62 (Suppl 1):S11-S61.    CBG: No results for input(s): GLUCAP in the last 168 hours.  Review of  Systems:   Review of Systems  Constitutional: Negative.   HENT: Negative.   Eyes: Negative.   Respiratory: Positive for cough and shortness of breath.   Cardiovascular: Positive for chest pain.  Gastrointestinal: Negative.   Musculoskeletal: Negative.   Skin: Negative.   Neurological: Negative.   Endo/Heme/Allergies: Negative.   Psychiatric/Behavioral: Negative.      Past Medical History  She,  has a past medical history of Anxiety, Arthritis, Bronchitis, COPD (chronic obstructive pulmonary disease) ([place]), Depression, Hypertension, Malignant neoplasm of upper-outer quadrant of left female breast ([place]) (01/04/2015), Nocturia, Numbness and tingling, Pneumonia, and PONV (postoperative nausea and vomiting).   Surgical History    Past Surgical History:  Procedure Laterality Date  . BREAST LUMPECTOMY Bilateral    x3  . BREAST LUMPECTOMY WITH RADIOACTIVE SEED LOCALIZATION Right 08/23/2015   Procedure: RIGHT BREAST LUMPECTOMY WITH RADIOACTIVE SEED LOCALIZATION;  Surgeon: Alphonsa Overall, MD;  Location: [place];  Service: General;  Laterality: Right;  . CESAREAN SECTION     x4  . IR GUIDED DRAIN W CATHETER PLACEMENT  06/02/2018  . IR IMAGING GUIDED PORT INSERTION  05/25/2018  . IR REMOVAL TUN ACCESS W/ PORT W/O FL MOD SED  10/20/2016  . IR THORACENTESIS ASP PLEURAL SPACE W/IMG GUIDE  05/12/2018  . IR THORACENTESIS ASP PLEURAL SPACE W/IMG GUIDE  07/07/2018  . MASTECTOMY Left 08/23/2015    RIGHT BREAST LUMPECTOMY WITH RADIOACTIVE SEED LOCALIZATION,  LEFT MASTECTOMY WITH AXILLARY LYMPH NODE DISSECTION  . MASTECTOMY WITH AXILLARY LYMPH NODE DISSECTION Left 08/23/2015   Procedure: LEFT MASTECTOMY WITH AXILLARY LYMPH NODE DISSECTION;  Surgeon: Alphonsa Overall, MD;  Location: [place];  Service: General;  Laterality: Left;  [name] MULTIPLE TOOTH EXTRACTIONS       Social History   reports that she has been smoking cigarettes. She has a 33.75 pack-year smoking history. She has never used smokeless tobacco. She  reports current alcohol use. She reports that she does not use drugs.   Family History   Her family history includes Breast cancer in  her maternal aunt.   Allergies Allergies  Allergen Reactions  . Fentanyl Nausea And Vomiting and Other (See Comments)    Headache  . Morphine And Related Nausea And Vomiting and Other (See Comments)    Headache     Home Medications  Prior to Admission medications   Medication Sig Start Date End Date Taking? Authorizing Provider  albuterol (PROVENTIL HFA;VENTOLIN HFA) 108 (90 Base) MCG/ACT inhaler Inhale 2 puffs into the lungs every 6 (six) hours as needed for wheezing or shortness of breath.    Yes [provider]  albuterol (PROVENTIL) (2.5 MG/3ML) 0.083% nebulizer solution Take 3 mLs (2.5 mg total) by nebulization every 6 (six) hours as needed for up to 30 days for wheezing or shortness of breath. 07/02/18 08/01/18 Yes Alma Friendly, MD  amLODipine (NORVASC) 5 MG tablet Take 1 tablet (5 mg total) by mouth daily. 05/16/18  Yes Regalado, Belkys A, MD  aspirin EC 81 MG tablet Take 81 mg by mouth daily.   Yes [provider]  dronabinol (MARINOL) 2.5 MG capsule Take 1 capsule (2.5 mg total) by mouth 2 (two) times daily before a meal. 07/21/18  Yes Causey, Charlestine Massed, NP  gabapentin (NEURONTIN) 400 MG capsule Take 1 capsule (400 mg total) by mouth 3 (three) times daily. 07/17/18  Yes Kayleen Memos, DO  lidocaine-prilocaine (EMLA) cream Apply to affected area once Patient taking differently: Apply 1 application topically daily as needed (port access).  05/19/18  Yes Nicholas Lose, MD  metoprolol tartrate (LOPRESSOR) 25 MG tablet Take 1 tablet (25 mg total) by mouth 2 (two) times daily. 06/02/18  Yes Georgette Shell, MD  Multiple Vitamin (MULTIVITAMIN WITH MINERALS) TABS tablet Take 1 tablet by mouth daily. 07/18/18  Yes Hall, Carole N, DO  nicotine (NICODERM CQ - DOSED IN MG/24 HOURS) 21 mg/24hr patch Place 1 patch (21 mg total) onto  the skin daily. 07/19/18  Yes Nicholas Lose, MD  nystatin (MYCOSTATIN) 100000 UNIT/ML suspension Use as directed 5 mLs (500,000 Units total) in the mouth or throat 4 (four) times daily. 07/17/18  Yes Hall, Carole N, DO  ondansetron (ZOFRAN) 8 MG tablet Take 1 tablet (8 mg total) by mouth every 8 (eight) hours as needed for nausea. 09/23/17  Yes Nicholas Lose, MD  oxyCODONE-acetaminophen (PERCOCET/ROXICET) 5-325 MG tablet Take 1 tablet by mouth every 8 (eight) hours as needed for moderate pain or severe pain (1 tab every 4-6 hour prn pain). 07/19/18  Yes Nicholas Lose, MD  PARoxetine (PAXIL) 20 MG tablet Take 1 tablet (20 mg total) by mouth 2 (two) times daily. 05/15/18  Yes Regalado, Belkys A, MD  potassium chloride (K-DUR) 10 MEQ tablet Take 2 tablets (20 mEq total) by mouth daily. 07/21/18  Yes Causey, Charlestine Massed, NP  ALPRAZolam Duanne Moron) 0.5 MG tablet Take 1 tablet (0.5 mg total) by mouth 3 (three) times daily as needed for anxiety or sleep. Patient not taking: Reported on 07/27/2018 05/15/18   Regalado, Jerald Kief A, MD  benzonatate (TESSALON) 200 MG capsule Take 1 capsule (200 mg total) by mouth 2 (two) times daily as needed for cough. Patient not taking: Reported on 07/27/2018 07/17/18   Kayleen Memos, DO  dexamethasone (DECADRON) 1 MG tablet Take 1 tablet (1 mg total) by mouth daily. Patient not taking: Reported on 07/27/2018 06/17/18   Nicholas Lose, MD  PREMARIN vaginal cream INSERT 1 APPLICATORFUL VAGINALLY DAILY Patient taking differently: Place 1 Applicatorful vaginally daily as needed (dryness).  04/19/17   Lindi Adie,  Loleta Dicker, MD  prochlorperazine (COMPAZINE) 10 MG tablet Take 1 tablet (10 mg total) by mouth every 6 (six) hours as needed (Nausea or vomiting). Patient taking differently: Take 10 mg by mouth every 6 (six) hours as needed for nausea or vomiting.  05/19/18   Nicholas Lose, MD  promethazine (PHENERGAN) 12.5 MG tablet Take 1 tablet (12.5 mg total) by mouth every 6 (six) hours as needed for  nausea or vomiting. 07/19/18   Nicholas Lose, MD     Critical care time: na    Erick Colace ACNP-BC [place] Pager # [phone] OR # [phone] if no answer

## 2018-07-28 NOTE — Progress Notes (Signed)
[name]  PROGRESS NOTE    Deborah Mcintyre  GUR:[id number] DOB: [birth year] DOA: 07/27/2018 PCP: [name], MD   Brief Narrative:   Deborah Mcintyre is a 63 y.o. female with medical history significant of anxiety, depression, osteoarthritis, bronchitis, COPD, hypertension, nocturia, bilateral tingling and numbness of toes, history of healthcare associated pneumonia at discharged 9 days ago, history of breast cancer who had a chest tube placement due to a left-sided malignant pleural effusion and is coming to the emergency department due to his lack of drainage associated with 3 days of progressively worse dyspnea and intermittent chest pain.  EMS reported that the patient has sinus tachycardia but went momentarily into A. fib prior to arrival.  Patient denies fever, chills, but feels fatigued, decreased appetite and sleep.  She denies rhinorrhea, sore throat, or hemoptysis.  She has occasional wheezing.  Occasional postural dizziness, no palpitations, diaphoresis, PND, orthopnea or pitting edema of the lower extremities.  Her chest pain is pleuritic.  Denies abdominal pain, nausea, emesis, diarrhea, constipation, melena or hematochezia.  No dysuria, frequency or hematuria.  No polyuria, polydipsia, polyphagia or blurred vision..   Assessment & Plan:   Principal Problem:   Pulmonary emboli (HCC) Active Problems:   Antineoplastic chemotherapy induced pancytopenia (HCC)   Hypertension   COPD (chronic obstructive pulmonary disease) (HCC)   Anxiety and depression   Tobacco abuse   Chest pain   Hypokalemia   Pulmonary embolus     - CTA chest as below     - Continue supplemental oxygen.     - eliquis     - LE dopplers ordered  Chest tube dysfunction     - spoke with PCCM about CT drainage problems; recommending discontinuing CT; IR has been consulted  Antineoplastic chemotherapy induced pancytopenia ([place])     - Her platelets had normalized, but continues to be anemic/neutropenic.     -  Monitor CBC.     - Follow-up with oncology as scheduled.  Hypertension     - amlodipine, metoprolol  COPD     - Continue supplemental oxygen.     - Bronchodilators as needed.  Anxiety and depression     - Continue paroxetine  Tobacco abuse     - Nicotine replacement therapy as needed.  Chest pain     - Analgesics as needed.  Hypokalemia     - replete, monitor  Ongoing pain this AM. Increase oxy. CT to be removed. Not sure there is a lot to offer here.   DVT prophylaxis: Eliquis Code Status: FULL   Disposition Plan: TBD   Consultants:   PCCM  IR    Subjective: "I feel a little better this morning. Still a little hard to breathe."  Objective: Vitals:   07/28/18 0600 07/28/18 0700 07/28/18 0800 07/28/18 0815  BP: 138/71  (!) 133/95   Pulse:   (!) 108   Resp: 20 16 (!) 27   Temp:    97.9 F (36.6 C)  TempSrc:    Oral  SpO2: 92% 94% 100%   Weight:      Height:        Intake/Output Summary (Last 24 hours) at 07/28/2018 1052 Last data filed at 07/28/2018 0800 Gross per 24 hour  Intake 2633.89 ml  Output --  Net 2633.89 ml   Filed Weights   07/27/18 1459  Weight: 72.6 kg    Examination:  General: 63 y.o. female resting in bed in NAD Cardiovascular: RRR, +S1, S2, no m/g/r,  equal pulses throughout Respiratory: decreased at left apex w/ some crackles at b/l bases; pleurex cath noted anterior left, right chest port noted GI: BS+, NDNT, no masses noted, no organomegaly noted MSK: No e/c/c Skin: No rashes, bruises, ulcerations noted Neuro: A&O x 3, no focal deficits Psyc: Appropriate interaction and affect, calm/cooperative   Data Reviewed: I have personally reviewed following labs and imaging studies.  CBC: Recent Labs  Lab 07/21/18 1215 07/27/18 1602 07/28/18 0415  WBC 15.8* 2.3* 2.1*  NEUTROABS 12.2* 1.4* 0.7*  HGB 9.2* 8.6* 8.0*  HCT 30.5* 29.1* 28.2*  MCV 90.8 93.6 95.6  PLT 313 355 778   Basic Metabolic Panel: Recent Labs  Lab  07/21/18 1215 07/27/18 1600 07/27/18 1602 07/28/18 0415  NA 144  --  143 141  K 3.2*  --  3.0* 4.2  CL 102  --  100 105  CO2 30  --  30 27  GLUCOSE 106*  --  94 101*  BUN 7*  --  6* <5*  CREATININE 0.59  --  0.54 0.42*  CALCIUM 8.4*  --  8.6* 7.9*  MG  --  1.9  --   --    GFR: Estimated Creatinine Clearance: 71.4 mL/min (A) (by C-G formula based on SCr of 0.42 mg/dL (L)). Liver Function Tests: Recent Labs  Lab 07/21/18 1215 07/27/18 1602 07/28/18 0415  AST 12* 13* 13*  ALT 6 7 7   ALKPHOS 91 56 49  BILITOT 0.2* 0.5 0.3  PROT 5.9* 6.2* 6.1*  ALBUMIN 2.2* 2.6* 2.5*   No results for input(s): LIPASE, AMYLASE in the last 168 hours. No results for input(s): AMMONIA in the last 168 hours. Coagulation Profile: No results for input(s): INR, PROTIME in the last 168 hours. Cardiac Enzymes: No results for input(s): CKTOTAL, CKMB, CKMBINDEX, TROPONINI in the last 168 hours. BNP (last 3 results) No results for input(s): PROBNP in the last 8760 hours. HbA1C: No results for input(s): HGBA1C in the last 72 hours. CBG: No results for input(s): GLUCAP in the last 168 hours. Lipid Profile: No results for input(s): CHOL, HDL, LDLCALC, TRIG, CHOLHDL, LDLDIRECT in the last 72 hours. Thyroid Function Tests: No results for input(s): TSH, T4TOTAL, FREET4, T3FREE, THYROIDAB in the last 72 hours. Anemia Panel: No results for input(s): VITAMINB12, FOLATE, FERRITIN, TIBC, IRON, RETICCTPCT in the last 72 hours. Sepsis Labs: Recent Labs  Lab 07/27/18 1602 07/27/18 1750  LATICACIDVEN 1.2 0.9    Recent Results (from the past 240 hour(s))  Culture, blood (routine x 2)     Status: None (Preliminary result)   Collection Time: 07/27/18  3:37 PM   Specimen: BLOOD  Result Value Ref Range Status   Specimen Description   Final    BLOOD RIGHT ANTECUBITAL Performed at [facility] [address], [place], [place] [address]    Special Requests   Final    BOTTLES DRAWN  AEROBIC AND ANAEROBIC Blood Culture results may not be optimal due to an excessive volume of blood received in culture bottles Performed at [facility] [address], [place], [place] [address]    Culture   Final    NO GROWTH < 24 HOURS Performed at [facility] [address] [place],  [address]    Report Status PENDING  Incomplete  Culture, blood (routine x 2)     Status: None (Preliminary result)   Collection Time: 07/27/18  4:02 PM   Specimen: BLOOD  Result Value Ref Range  Status   Specimen Description   Final    BLOOD PORTA CATH Performed at [facility] [address] [place], [place] [address]    Special Requests   Final    BOTTLES DRAWN AEROBIC AND ANAEROBIC Blood Culture adequate volume Performed at [facility] [address] [place], [place] [address]    Culture   Final    NO GROWTH < 24 HOURS Performed at [facility] [address] [place], [place] [address]    Report Status PENDING  Incomplete  SARS Coronavirus 2 (CEPHEID - Performed in [facility] hospital lab), Hosp Order     Status: None   Collection Time: 07/27/18  8:55 PM   Specimen: Nasopharyngeal Swab  Result Value Ref Range Status   SARS Coronavirus 2 NEGATIVE NEGATIVE Final    Comment: (NOTE) If result is NEGATIVE SARS-CoV-2 target nucleic acids are NOT DETECTED. The SARS-CoV-2 RNA is generally detectable in upper and lower  respiratory specimens during the acute phase of infection. The lowest  concentration of SARS-CoV-2 viral copies this assay can detect is 250  copies / mL. A negative result does not preclude SARS-CoV-2 infection  and should not be used as the sole basis for treatment or other  patient management decisions.  A negative result may occur with  improper specimen collection / handling, submission of specimen other  than nasopharyngeal swab, presence of viral mutation(s) within the  areas targeted by this assay, and  inadequate number of viral copies  (<250 copies / mL). A negative result must be combined with clinical  observations, patient history, and epidemiological information. If result is POSITIVE SARS-CoV-2 target nucleic acids are DETECTED. The SARS-CoV-2 RNA is generally detectable in upper and lower  respiratory specimens dur ing the acute phase of infection.  Positive  results are indicative of active infection with SARS-CoV-2.  Clinical  correlation with patient history and other diagnostic information is  necessary to determine patient infection status.  Positive results do  not rule out bacterial infection or co-infection with other viruses. If result is PRESUMPTIVE POSTIVE SARS-CoV-2 nucleic acids MAY BE PRESENT.   A presumptive positive result was obtained on the submitted specimen  and confirmed on repeat testing.  While 2019 novel coronavirus  (SARS-CoV-2) nucleic acids may be present in the submitted sample  additional confirmatory testing may be necessary for epidemiological  and / or clinical management purposes  to differentiate between  SARS-CoV-2 and other Sarbecovirus currently known to infect humans.  If clinically indicated additional testing with an alternate test  methodology [phone]) is advised. The SARS-CoV-2 RNA is generally  detectable in upper and lower respiratory sp ecimens during the acute  phase of infection. The expected result is Negative. Fact Sheet for Patients:  StrictlyIdeas.no Fact Sheet for Healthcare Providers: BankingDealers.co.za This test is not yet approved or cleared by the [place] FDA and has been authorized for detection and/or diagnosis of SARS-CoV-2 by FDA under an Emergency Use Authorization (EUA).  This EUA will remain in effect (meaning this test can be used) for the duration of the COVID-19 declaration under Section 564(b)(1) of the Act, 21 U.S.C. section 360bbb-3(b)(1), unless the  authorization is terminated or revoked sooner. Performed at [facility], [place] [address] [place],  [address]   MRSA PCR Screening     Status: None   Collection Time: 07/27/18 10:22 PM   Specimen: Nasal Mucosa; Nasopharyngeal  Result Value Ref Range Status  MRSA by PCR NEGATIVE NEGATIVE Final    Comment:        The GeneXpert MRSA Assay (FDA approved for NASAL specimens only), is one component of a comprehensive MRSA colonization surveillance program. It is not intended to diagnose MRSA infection nor to guide or monitor treatment for MRSA infections. Performed at [facility], [place] [address], [place], [place] [address]       Radiology Studies: Ct Angio Chest Pe W And/or Wo Contrast  Result Date: 07/27/2018 CLINICAL DATA:  Breast and lung cancer, chest tube, shortness of breath for 3 days, tube not draining properly, elevated D-dimer, question pulmonary embolism EXAM: CT ANGIOGRAPHY CHEST WITH CONTRAST TECHNIQUE: Multidetector CT imaging of the chest was performed using the standard protocol during bolus administration of intravenous contrast. Multiplanar CT image reconstructions and MIPs were obtained to evaluate the vascular anatomy. CONTRAST:  148mL OMNIPAQUE IOHEXOL 350 MG/ML SOLN COMPARISON:  06/30/2018 FINDINGS: Cardiovascular: Atherosclerotic calcifications of thoracic aorta. Ascending aorta 4.0 cm transverse. No evidence of dissection. No pericardial effusion. Pulmonary arteries adequately opacified. Small pulmonary embolus identified within a RIGHT lower lobe pulmonary artery, medial basal segment. No additional pulmonary emboli are definitely visualized. RIGHT jugular Port-A-Cath with tip in SVC. Few additional normal sized Mediastinum/Nodes: Probable enlarged precarinal lymph node 15 mm short axis image 33. 13 mm short axis AP window node image 36. Subcarinal adenopathy 23 mm short axis previously 22 mm. Esophagus unremarkable. Base  of cervical region normal appearance. No axillary adenopathy. Prior LEFT mastectomy. Lungs/Pleura: Large loculated LEFT pleural effusion despite thoracostomy tube. Complete collapse of the LEFT upper lobe with abrupt cut off of air within LEFT upper lobe bronchus. Mild compressive atelectasis of LEFT lower lobe with narrowing of LEFT lower lobe airways and peribronchial thickening. Patchy areas of opacity in periphery of RIGHT lung, greater in RIGHT lower lobe, favor pneumonia though attention on follow-up recommended to exclude developing tumor. No pneumothorax. No RIGHT pleural effusion. Upper Abdomen: 14 mm short axis gastrohepatic ligament lymph node image 90. Visualized upper abdomen otherwise unremarkable Musculoskeletal: No acute osseous findings. Review of the MIP images confirms the above findings. IMPRESSION: Small pulmonary embolus in RIGHT lower lobe medially. Large loculated RIGHT pleural effusion with complete atelectasis of LEFT upper lobe and partial atelectasis of LEFT lower lobe. Patchy opacities in RIGHT lung favor pneumonia, slightly increased in RIGHT lower lobe since previous exam, recommend follow-up until resolution to exclude underlying mass. Persistent mediastinal adenopathy. Mildly enlarged gastrohepatic ligament lymph node. Aortic Atherosclerosis (ICD10-I70.0). Findings called to Dr. Regenia Skeeter on 07/27/2018 at 1904 hrs. Electronically Signed   By: Lavonia Dana M.D.   On: 07/27/2018 19:05   Dg Chest Portable 1 View  Result Date: 07/27/2018 CLINICAL DATA:  Dyspnea and short of breath. History of breast cancer and lung cancer. Chest tube EXAM: PORTABLE CHEST 1 VIEW COMPARISON:  07/14/2018 FINDINGS: Progression of left effusion compared to the prior study. Left pleural drainage catheter remains in place unchanged. No pneumothorax. Compressive atelectasis or infiltrate throughout the left lung has progressed Right lung clear.  Port-A-Cath tip SVC. IMPRESSION: Interval progression of left  effusion and airspace disease on the left likely due to metastatic disease. Electronically Signed   By: Franchot Gallo M.D.   On: 07/27/2018 15:39     Scheduled Meds:  amLODipine  5 mg Oral Daily   apixaban  10 mg Oral BID   Followed by   Derrill Memo ON 08/03/2018] apixaban  5 mg Oral BID   aspirin EC  81 mg Oral Daily   Chlorhexidine Gluconate Cloth  6 each Topical Daily   dronabinol  2.5 mg Oral BID AC   feeding supplement (ENSURE ENLIVE)  237 mL Oral BID BM   gabapentin  400 mg Oral TID   mouth rinse  15 mL Mouth Rinse BID   metoprolol tartrate  25 mg Oral BID   nicotine  21 mg Transdermal Q24H   PARoxetine  20 mg Oral BID   potassium chloride  20 mEq Oral Daily   Continuous Infusions:   LOS: 1 day    Time spent: 35 minutes spent in the coordination of care today.    Jonnie Finner, DO [facility] Pager [phone]  If 7PM-7AM, please contact night-coverage [email or web address] Password [facility] 07/28/2018, 10:52 AM

## 2018-07-28 NOTE — Procedures (Signed)
PROCEDURE SUMMARY:  Successful removal of tunneled left pleurX catheter. No immediate complications.  EBL = 5 mL. Patient tolerated well.  Pressure dressing applied to site. Please see imaging section of Epic for full dictation.   Earley Abide PA-C 07/28/2018 4:52 PM

## 2018-07-28 NOTE — TOC Initial Note (Signed)
Transition of Care [facility]) - Initial/Assessment Note    Patient Details  Name: Deborah Mcintyre MRN: [id number] Date of Birth: 08-Aug-1955  Transition of Care [facility] CM/SW Contact:    Nila Nephew, LCSW Phone Number: [phone] 07/28/2018, 1:18 PM  Clinical Narrative:      Completed high readmission risk screening due to score 47%. Pt with recent admission for pneumonia, admitted currently with PE. History of breast cancer and malignant pleural effusion per chart, has chest tube. Pt has [facility] home health services and will need resumption orders at DC.  TOC team will follow to assist with disposition needs             Activities of Daily Living Home Assistive Devices/Equipment: Oxygen ADL Screening (condition at time of admission) Patient's cognitive ability adequate to safely complete daily activities?: Yes Is the patient deaf or have difficulty hearing?: No Does the patient have difficulty seeing, even when wearing glasses/contacts?: No Does the patient have difficulty concentrating, remembering, or making decisions?: No Patient able to express need for assistance with ADLs?: Yes Does the patient have difficulty dressing or bathing?: No Independently performs ADLs?: Yes (appropriate for developmental age) Does the patient have difficulty walking or climbing stairs?: Yes Weakness of Legs: Both Weakness of Arms/Hands: None  Permission Sought/Granted                  Emotional Assessment              Admission diagnosis:  Pleural effusion on left [J90] Single subsegmental pulmonary embolism without acute cor pulmonale [I26.93] Patient Active Problem List   Diagnosis Date Noted  . Pulmonary emboli ([place]) 07/27/2018  . HCAP (healthcare-associated pneumonia) 07/14/2018  . Loculated pleural effusion 06/30/2018  . Hypokalemia 06/30/2018  . Port-A-Cath in place 06/24/2018  . Status post chest tube placement ([place])   . Tobacco abuse 06/01/2018  . Chest pain 06/01/2018   . Elevated troponin 06/01/2018  . Cigarette smoker 05/27/2018  . Goals of care, counseling/discussion 05/19/2018  . 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  . Hypertension 05/12/2018  . COPD (chronic obstructive pulmonary disease) ([place]) 05/12/2018  . Anxiety and depression 05/12/2018  . Community acquired pneumonia of left lung 02/04/2017  . Antineoplastic chemotherapy induced pancytopenia ([place]) 04/26/2015  . Antineoplastic chemotherapy induced anemia 04/05/2015  . Breast cancer of upper-outer quadrant of left female breast ([place]) 01/01/2015  . Acute exacerbation of chronic bronchitis ([place]) 08/03/2013   PCP:  [name], MD Pharmacy:   [facility] [place], [place] [address] Phone: [phone] Fax: [phone]     Social Determinants of Health (SDOH) Interventions    Readmission Risk Interventions Readmission Risk Prevention Plan 07/28/2018 07/15/2018  Transportation Screening Complete Complete  Medication Review Press photographer) Not Complete Complete  Med Review Comments 3 medications needing review per RN admission summary -  PCP or Specialist appointment within 3-5 days of discharge Not Complete Complete  PCP/Specialist Appt Not Complete comments DC date unknown -  HRI or [place] Complete Complete  SW Recovery Care/Counseling Consult Complete Complete  Palliative Care Screening Not Applicable Not [place] Not Applicable Not Applicable  Some recent data might be hidden

## 2018-07-28 NOTE — Progress Notes (Signed)
Initial Nutrition Assessment  DOCUMENTATION CODES:   Non-severe (moderate) malnutrition in context of chronic illness  INTERVENTION:  - continue Ensure Enlive BID, each supplement provides 350 kcal and 20 grams of protein. - will order 30 mL Prostat BID, each supplement provides 100 kcal and 15 grams of protein. - will order daily multivitamin with minerals. - recommended adjusting from PRN phenergan to scheduled phenergan 15-30 minutes prior to meals.    NUTRITION DIAGNOSIS:   Moderate Malnutrition related to chronic illness, cancer and cancer related treatments as evidenced by mild fat depletion, mild muscle depletion.  GOAL:   Patient will meet greater than or equal to 90% of their needs  MONITOR:   PO intake, Supplement acceptance, Labs, Weight trends, I & O's  REASON FOR ASSESSMENT:   Malnutrition Screening Tool  ASSESSMENT:   63 y.o. female with medical history significant of anxiety, depression, osteoarthritis, bronchitis, COPD, HTN, nocturia, bilateral tingling and numbness of toes, metastatic breast cancer with mets to lung and pleural space. She has had several hospitalizations over the past 2 months and was discharged 9 days PTA following admission for HCAP. She had chest tube placed d/t to L-sided malignant pleural effusion and presented to the ED d/t lack of drainage from tube with associated 3 day history of dyspnea and intermittent chest pain. She reported fatigue, decreased appetite, and decreased sleep.  No intakes documented since admission. Patient reports that she consumed ~50% of breakfast this AM which consisted of french toast, sausage, cheesy eggs, and grape juice. She reports nausea after eating which is usual for her. She often feels nauseated after intakes no matter what she consumes. She reports poor appetite/lack of interest in eating for at least the past 2 months. The smell, taste, and thought of food can make her nauseated.   Talked with patient  about ways to help control nausea at home: staying out of the kitchen, eating room temperature or cold items, and using phenergan (which she uses at home and works well for her). She typically takes phenergan after eating, once she is already nauseated. Talked with her about taking it prior to eating and she is open to this.   She reports being started on marinol the day PTA. Noted marinol is currently ordered. Patient lives with her husband and a daughter who do all of the cooking in the home. Patient prefers to cook but reports she is too weak to do so or too weak to eat after cooking. She asks about gaining strength and beginning to re-gain weight. Discussed consuming small, more frequent portions, adding protein powder to items, and making smoothies and/or milk shakes as these can provide substantial nutrition without the effort/energy of chewing and decreased holding time in mouth which may also decrease nausea. Also encouraged adding more butter, oil, sauce, gravy, etc to items.   Per chart review, current weight is 160 lb and weight on 6/19 was 171 lb. This indicates 11 lb weight loss (6.4% body weight) in the past 1 month; significant for time frame.   Ensure was ordered BID at time of admission. Patient has had this supplement in the past and is very open to getting it during this admission.    Labs reviewed; BUN: <5 mg/dl, creatinine: 0.42 mg/dl, Ca: 7.9 mg/dl. Medications reviewed; 2.5 mg marinol BID, 2 g IV mg sulfate x1 run 7/22, 20 mEq K-Dur/day.     NUTRITION - FOCUSED PHYSICAL EXAM:    Most Recent Value  Orbital Region  No depletion  Upper Arm Region  Mild depletion  Thoracic and Lumbar Region  Unable to assess  Buccal Region  Mild depletion  Temple Region  Mild depletion  Clavicle Bone Region  Mild depletion  Clavicle and Acromion Bone Region  Mild depletion  Scapular Bone Region  Unable to assess  Dorsal Hand  No depletion  Patellar Region  Mild depletion  Anterior Thigh  Region  Unable to assess  Posterior Calf Region  Mild depletion  Edema (RD Assessment)  None  Hair  Reviewed  Eyes  Reviewed  Mouth  Reviewed  Skin  Reviewed  Nails  Reviewed       Diet Order:   Diet Order            Diet regular Room service appropriate? Yes; Fluid consistency: Thin  Diet effective now              EDUCATION NEEDS:   Education needs have been addressed  Skin:  Skin Assessment: Reviewed RN Assessment  Last BM:  PTA/unknown  Height:   Ht Readings from Last 1 Encounters:  07/27/18 5' 7.5" (1.715 m)    Weight:   Wt Readings from Last 1 Encounters:  07/27/18 72.6 kg    Ideal Body Weight:  62.5 kg  BMI:  Body mass index is 24.69 kg/m.  Estimated Nutritional Needs:   Kcal:  2325-2540 kcal  Protein:  115-130 grams  Fluid:  >/= 1.5 L/day     Jarome Matin, MS, RD, LDN, [facility] Inpatient Clinical Dietitian Pager # [phone] After hours/weekend pager # [phone]

## 2018-07-29 DIAGNOSIS — E440 Moderate protein-calorie malnutrition: Secondary | ICD-10-CM | POA: Insufficient documentation

## 2018-07-29 DIAGNOSIS — C50919 Malignant neoplasm of unspecified site of unspecified female breast: Secondary | ICD-10-CM

## 2018-07-29 DIAGNOSIS — C50412 Malignant neoplasm of upper-outer quadrant of left female breast: Secondary | ICD-10-CM

## 2018-07-29 LAB — CBC WITH DIFFERENTIAL/PLATELET
Abs Immature Granulocytes: 0.05 10*3/uL (ref 0.00–0.07)
Basophils Absolute: 0 10*3/uL (ref 0.0–0.1)
Basophils Relative: 2 %
Eosinophils Absolute: 0 10*3/uL (ref 0.0–0.5)
Eosinophils Relative: 0 %
HCT: 28.3 % — ABNORMAL LOW (ref 36.0–46.0)
Hemoglobin: 8.1 g/dL — ABNORMAL LOW (ref 12.0–15.0)
Immature Granulocytes: 4 %
Lymphocytes Relative: 23 %
Lymphs Abs: 0.3 10*3/uL — ABNORMAL LOW (ref 0.7–4.0)
MCH: 27.5 pg (ref 26.0–34.0)
MCHC: 28.6 g/dL — ABNORMAL LOW (ref 30.0–36.0)
MCV: 95.9 fL (ref 80.0–100.0)
Monocytes Absolute: 0.4 10*3/uL (ref 0.1–1.0)
Monocytes Relative: 31 %
Neutro Abs: 0.5 10*3/uL — ABNORMAL LOW (ref 1.7–7.7)
Neutrophils Relative %: 40 %
Platelets: 363 10*3/uL (ref 150–400)
RBC: 2.95 MIL/uL — ABNORMAL LOW (ref 3.87–5.11)
RDW: 15.9 % — ABNORMAL HIGH (ref 11.5–15.5)
WBC: 1.2 10*3/uL — CL (ref 4.0–10.5)
nRBC: 0 % (ref 0.0–0.2)

## 2018-07-29 LAB — COMPREHENSIVE METABOLIC PANEL
ALT: 7 U/L (ref 0–44)
AST: 9 U/L — ABNORMAL LOW (ref 15–41)
Albumin: 2.3 g/dL — ABNORMAL LOW (ref 3.5–5.0)
Alkaline Phosphatase: 49 U/L (ref 38–126)
Anion gap: 12 (ref 5–15)
BUN: 5 mg/dL — ABNORMAL LOW (ref 8–23)
CO2: 26 mmol/L (ref 22–32)
Calcium: 8.2 mg/dL — ABNORMAL LOW (ref 8.9–10.3)
Chloride: 101 mmol/L (ref 98–111)
Creatinine, Ser: 0.58 mg/dL (ref 0.44–1.00)
GFR calc Af Amer: >60 mL/min (ref >60)
GFR calc non Af Amer: >60 mL/min (ref >60)
Glucose, Bld: 116 mg/dL — ABNORMAL HIGH (ref 70–99)
Potassium: 4 mmol/L (ref 3.5–5.1)
Sodium: 139 mmol/L (ref 135–145)
Total Bilirubin: 0.6 mg/dL (ref 0.3–1.2)
Total Protein: 5.7 g/dL — ABNORMAL LOW (ref 6.5–8.1)

## 2018-07-29 MED ORDER — KETOROLAC TROMETHAMINE 30 MG/ML IJ SOLN
30.0000 mg | Freq: Once | INTRAMUSCULAR | Status: AC
  Administered 2018-07-29: 30 mg via INTRAVENOUS
  Filled 2018-07-29: qty 1

## 2018-07-29 MED ORDER — LEVOFLOXACIN 750 MG PO TABS: 750.0000 mg | ORAL_TABLET | Freq: Every day | ORAL | Status: DC

## 2018-07-29 MED ORDER — SODIUM CHLORIDE 0.9 % IV SOLN
INTRAVENOUS | Status: DC
  Administered 2018-07-29 – 2018-08-03 (×9): via INTRAVENOUS

## 2018-07-29 MED ORDER — LEVOFLOXACIN IN D5W 750 MG/150ML IV SOLN
750.0000 mg | INTRAVENOUS | Status: AC
  Administered 2018-07-29 – 2018-07-31 (×3): 750 mg via INTRAVENOUS
  Filled 2018-07-29 (×3): qty 150

## 2018-07-29 NOTE — Progress Notes (Signed)
HEMATOLOGY-ONCOLOGY PROGRESS NOTE  SUBJECTIVE: Admitted with pneumonia and pleural effusion that is not draining. Pleurex removed. She has diffuse pain issues. 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   12/26/2014 Initial Diagnosis   Left breast biopsy: Invasive ductal carcinoma with lymphovascular invasion, 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, grade 3, ER/PR negative, Ki-67 80% HER-2 Neg   01/18/2015 - 06/21/2015 Neo-Adjuvant Chemotherapy   Dose dense Adriamycin and Cytoxan 4 followed by Taxol and carboplatin [id number]   07/03/2015 Breast MRI   Improvement in multiple areas of enhancement largest area 3.4 cm other suspicious nodules anterior to the mass also improved, left axillary internal mammary lymph nodes improved   08/23/2015 Surgery   Left mastectomy with axillary lymph node dissection: Invasive ductal carcinoma with calcifications grade 3, 1.3 cm, image, margins negative, 1/6 lymph nodes positive with extracapsular extension, ER 0%, 0%, T1cN1a stage II a    10/31/2015 - 01/08/2016 Radiation Therapy   Adj XRT Lisbeth Renshaw): Left Chest Wall and Left SCLV treated to 50.4 Gy in 28 fractions of 1.8 Gy. Left Chest Wall was then boosted to 60.4 Gy in 5 fractions of 2 Gy.   02/03/2016 - 08/03/2016 Chemotherapy   Xeloda 1500 mg by mouth twice a day 2 weeks on one week off   02/04/2017 - 02/10/2017 Hospital Admission   Admitted for bilateral pneumonia   05/13/2018 Relapse/Recurrence   Left pleural effusion with left upper lobe consolidation and paratracheal and AP window lymphadenopathy: Thoracentesis: Cytology, adenocarcinoma  consistent with breast primary, positive for GA TA-3 and CK7, negative for CK20 and TTF-1, ER PR HER-2 negative   06/01/2018 - 06/03/2018 Hospital Admission   Admitted for chest pain and shortness of breath status post thoracentesis 2.7 L removed and Pleurx catheter was placed cytology positive for adenocarcinoma   06/09/2018 -  Chemotherapy   The patient had pegfilgrastim (NEULASTA ONPRO KIT) injection 6 mg, 6 mg, Subcutaneous, Once, 1 of 4 cycles eriBULin mesylate (HALAVEN) 2.8 mg in sodium chloride 0.9 % 100 mL chemo infusion, 1.4 mg/m2 = 2.8 mg, Intravenous,  Once, 3 of 6 cycles Administration: 2.8 mg (06/09/2018), 2.8 mg (06/24/2018), 2.8 mg (07/07/2018), 2.8 mg (07/21/2018)  for chemotherapy treatment.    06/16/2018 Miscellaneous   [facility] molecular testing: MMR proficient, TMB indeterminate, triple negative, ER negative, insufficient material for PDL 1, PTEN IHC positive mutation not detected     OBJECTIVE: REVIEW OF SYSTEMS:   Constitutional: Denies fevers, chills or abnormal weight loss Eyes: Denies blurriness of vision Ears, nose, mouth, throat, and face: Denies mucositis or sore throat Respiratory: SOB Cardiovascular: Denies palpitation, chest discomfort Gastrointestinal:  Denies nausea, heartburn or change in bowel habits Skin: Denies abnormal skin rashes Lymphatics: Denies new lymphadenopathy or easy bruising Neurological:Denies numbness, tingling or new weaknesses Behavioral/Psych: Mood is stable, no new changes  Extremities: No lower extremity edema All other systems were reviewed with the patient and are negative.  I have reviewed the past medical history, past surgical history, social history and family history with the patient and they are unchanged from previous note.   PHYSICAL EXAMINATION: ECOG PERFORMANCE STATUS: 3 - Symptomatic, >50% confined to  bed  Vitals:   07/29/18 1100 07/29/18 1200  BP: (!) 139/93 99/66  Pulse: (!) 116 (!) 106  Resp: (!) 21 19  Temp:     SpO2: 93% 93%   Filed Weights   07/27/18 1459  Weight: 160 lb (72.6 kg)    GENERAL:alert, no distress and comfortable SKIN: skin color, texture, turgor are normal, no rashes or significant lesions EYES: normal, Conjunctiva are pink and non-injected, sclera clear OROPHARYNX:no exudate, no erythema and lips, buccal mucosa, and tongue normal  NECK: supple, thyroid normal size, non-tender, without nodularity LYMPH:  no palpable lymphadenopathy in the cervical, axillary or inguinal LUNGS: Dim BS bilaterally HEART: regular rate & rhythm and no murmurs and no lower extremity edema ABDOMEN:abdomen soft, non-tender and normal bowel sounds Musculoskeletal:no cyanosis of digits and no clubbing  NEURO: alert & oriented x 3 with fluent speech, no focal motor/sensory deficits  LABORATORY DATA:  I have reviewed the data as listed CMP Latest Ref Rng & Units 07/29/2018 07/28/2018 07/27/2018  Glucose 70 - 99 mg/dL 116(H) 101(H) 94  BUN 8 - 23 mg/dL 5(L) <5(L) 6(L)  Creatinine 0.44 - 1.00 mg/dL 0.58 0.42(L) 0.54  Sodium 135 - 145 mmol/L 139 141 143  Potassium 3.5 - 5.1 mmol/L 4.0 4.2 3.0(L)  Chloride 98 - 111 mmol/L 101 105 100  CO2 22 - 32 mmol/L _0 Calcium 8.9 - 10.3 mg/dL 8.2(L) 7.9(L) 8.6(L)  Total Protein 6.5 - 8.1 g/dL 5.7(L) 6.1(L) 6.2(L)  Total Bilirubin 0.3 - 1.2 mg/dL 0.6 0.3 0.5  Alkaline Phos 38 - 126 U/L 49 49 56  AST 15 - 41 U/L 9(L) 13(L) 13(L)  ALT 0 - 44 U/L _1 Lab Results  Component Value Date   WBC 1.2 (LL) 07/29/2018   HGB 8.1 (L) 07/29/2018   HCT 28.3 (L) 07/29/2018   MCV 95.9 07/29/2018   PLT 363 07/29/2018   NEUTROABS 0.5 (L) 07/29/2018    ASSESSMENT AND PLAN: 1. Metastatic Breast cancer: Discussed with ehr the results of CT chest.  Chemo doesnot appear to be helping her cancer. In addition, its causing her to be higher risk for lung infections I recommended Hospice care. Shes contemplating on it. 2. Pancytopenia: die to chemo 3. Malignant pleural  effusion: Likely to get worse with time

## 2018-07-29 NOTE — Progress Notes (Addendum)
Deborah Mcintyre  PROGRESS NOTE    Deborah Mcintyre  VZD:[id number] DOB: [birth year] DOA: 07/27/2018 PCP: [name], MD   Brief Narrative:   Deborah Mcintyre a 62 y.o.femalewith medical history significant ofanxiety, depression, osteoarthritis, bronchitis, COPD, hypertension, nocturia, bilateral tingling and numbness of toes, history of healthcare associated pneumonia at discharged9 days ago, history of breast cancer who had a chest tube placement due to a left-sided malignant pleural effusion and is coming to the emergency department due to his lack of drainage associated with 3 days of progressively worse dyspnea and intermittent chest pain. EMS reported that the patient has sinus tachycardia but went momentarily into A. fib prior to arrival. Patient denies fever, chills, but feels fatigued, decreased appetite and sleep. Deborah Mcintyre denies rhinorrhea, sore throat, or hemoptysis. Deborah Mcintyre has occasional wheezing. Occasional postural dizziness, no palpitations, diaphoresis, PND, orthopnea or pitting edema of the lower extremities. Her chest pain is pleuritic. Denies abdominal pain, nausea, emesis, diarrhea, constipation, melena or hematochezia. No dysuria, frequency or hematuria. No polyuria, polydipsia, polyphagia or blurred vision..   Assessment & Plan:   Principal Problem:   Pulmonary emboli (HCC) Active Problems:   Antineoplastic chemotherapy induced pancytopenia (HCC)   Hypertension   COPD (chronic obstructive pulmonary disease) (HCC)   Anxiety and depression   Tobacco abuse   Chest pain   Hypokalemia   Malnutrition of moderate degree   Pulmonary embolus     - CTA chest as below     - Continue supplemental oxygen.     - eliquis     - LE dopplers ordered  RLL PNA     - as seen on CTA PE     - Deborah Mcintyre immune compromised from chemo     - hypotensive ON     - start lvq for HCAP, monitor  Chest tube dysfunction     - spoke with PCCM about CT drainage problems; recommending discontinuing  CT; IR has been consulted     - chest tube now removed  Antineoplastic chemotherapy induced pancytopenia (HCC)     - Her platelets had normalized, but continues to be anemic/neutropenic.     - Monitor CBC.     - Follow-up with oncology as scheduled.  Hypertension     - amlodipine, metoprolol     - hold BP meds given ON hypotension, monitor  COPD     - Continue supplemental oxygen.     - Bronchodilators as needed.  Anxiety and depression     - Continue paroxetine  Tobacco abuse     - Nicotine replacement therapy as needed.  Chest pain     - Analgesics as needed.  Hypokalemia     - replete, monitor   Spoke with Dr. Lindi Adie. He said he would come by this afternoon and speak with her about her prognosis. For now, we are consulting palliative care. We will add fluids and lvq 750 q24.   DVT prophylaxis: eliquis Code Status: FULL   Disposition Plan: TBD   Consultants:   PCCM  Palliative Care  Procedures:   CT removal  Antimicrobials:   Levaquin    Subjective: "So is it spreading?"  Objective: Vitals:   07/29/18 0346 07/29/18 0400 07/29/18 0500 07/29/18 0648  BP:  118/67 118/74   Pulse:  (!) 104 (!) 106   Resp:  20 (!) 26   Temp: 97.9 F (36.6 C)   98.3 F (36.8 C)  TempSrc: Oral   Oral  SpO2:  96% 92%  Weight:      Height:        Intake/Output Summary (Last 24 hours) at 07/29/2018 0719 Last data filed at 07/28/2018 0800 Gross per 24 hour  Intake 1633.89 ml  Output --  Net 1633.89 ml   Filed Weights   07/27/18 1459  Weight: 72.6 kg    Examination:  General: 63 y.o. female resting in bed in NAD Cardiovascular: RRR, +S1, S2, no m/g/r, equal pulses throughout Respiratory: decreased at left apex w/ some crackles at b/l bases, right chest port noted GI: BS+, NDNT, no masses noted, no organomegaly noted MSK: No e/c/c Skin: No rashes, bruises, ulcerations noted Neuro: A&O x 3, no focal deficits Psyc: Appropriate interaction and affect,  calm/cooperative    Data Reviewed: I have personally reviewed following labs and imaging studies.  CBC: Recent Labs  Lab 07/27/18 1602 07/28/18 0415 07/29/18 0307  WBC 2.3* 2.1* 1.2*  NEUTROABS 1.4* 0.7* 0.5*  HGB 8.6* 8.0* 8.1*  HCT 29.1* 28.2* 28.3*  MCV 93.6 95.6 95.9  PLT 355 336 629   Basic Metabolic Panel: Recent Labs  Lab 07/27/18 1600 07/27/18 1602 07/28/18 0415 07/29/18 0307  NA  --  143 141 139  K  --  3.0* 4.2 4.0  CL  --  100 105 101  CO2  --  30 27 26   GLUCOSE  --  94 101* 116*  BUN  --  6* <5* 5*  CREATININE  --  0.54 0.42* 0.58  CALCIUM  --  8.6* 7.9* 8.2*  MG 1.9  --   --   --    GFR: Estimated Creatinine Clearance: 71.4 mL/min (by C-G formula based on SCr of 0.58 mg/dL). Liver Function Tests: Recent Labs  Lab 07/27/18 1602 07/28/18 0415 07/29/18 0307  AST 13* 13* 9*  ALT 7 7 7   ALKPHOS 56 49 49  BILITOT 0.5 0.3 0.6  PROT 6.2* 6.1* 5.7*  ALBUMIN 2.6* 2.5* 2.3*   No results for input(s): LIPASE, AMYLASE in the last 168 hours. No results for input(s): AMMONIA in the last 168 hours. Coagulation Profile: No results for input(s): INR, PROTIME in the last 168 hours. Cardiac Enzymes: No results for input(s): CKTOTAL, CKMB, CKMBINDEX, TROPONINI in the last 168 hours. BNP (last 3 results) No results for input(s): PROBNP in the last 8760 hours. HbA1C: No results for input(s): HGBA1C in the last 72 hours. CBG: No results for input(s): GLUCAP in the last 168 hours. Lipid Profile: No results for input(s): CHOL, HDL, LDLCALC, TRIG, CHOLHDL, LDLDIRECT in the last 72 hours. Thyroid Function Tests: No results for input(s): TSH, T4TOTAL, FREET4, T3FREE, THYROIDAB in the last 72 hours. Anemia Panel: No results for input(s): VITAMINB12, FOLATE, FERRITIN, TIBC, IRON, RETICCTPCT in the last 72 hours. Sepsis Labs: Recent Labs  Lab 07/27/18 1602 07/27/18 1750  LATICACIDVEN 1.2 0.9    Recent Results (from the past 240 hour(s))  Culture, blood  (routine x 2)     Status: None (Preliminary result)   Collection Time: 07/27/18  3:37 PM   Specimen: BLOOD  Result Value Ref Range Status   Specimen Description   Final    BLOOD RIGHT ANTECUBITAL Performed at [facility] [address], [place], [place] [address]    Special Requests   Final    BOTTLES DRAWN AEROBIC AND ANAEROBIC Blood Culture results may not be optimal due to an excessive volume of blood received in culture bottles Performed at [facility] [address] [place],  [place] [address]    Culture   Final    NO GROWTH < 24 HOURS Performed at [facility], [place] [address], [place], [place] [address]    Report Status PENDING  Incomplete  Culture, blood (routine x 2)     Status: None (Preliminary result)   Collection Time: 07/27/18  4:02 PM   Specimen: BLOOD  Result Value Ref Range Status   Specimen Description   Final    BLOOD PORTA CATH Performed at [facility], [place] [address] [place], [place] [address]    Special Requests   Final    BOTTLES DRAWN AEROBIC AND ANAEROBIC Blood Culture adequate volume Performed at [facility] [address] [place], [place] [address]    Culture   Final    NO GROWTH < 24 HOURS Performed at [facility] [address] [place], [place] [address]    Report Status PENDING  Incomplete  SARS Coronavirus 2 (CEPHEID - Performed in [facility] hospital lab), Hosp Order     Status: None   Collection Time: 07/27/18  8:55 PM   Specimen: Nasopharyngeal Swab  Result Value Ref Range Status   SARS Coronavirus 2 NEGATIVE NEGATIVE Final    Comment: (NOTE) If result is NEGATIVE SARS-CoV-2 target nucleic acids are NOT DETECTED. The SARS-CoV-2 RNA is generally detectable in upper and lower  respiratory specimens during the acute phase of infection. The lowest  concentration of SARS-CoV-2 viral copies this assay can detect is 250  copies / mL. A negative result does not  preclude SARS-CoV-2 infection  and should not be used as the sole basis for treatment or other  patient management decisions.  A negative result may occur with  improper specimen collection / handling, submission of specimen other  than nasopharyngeal swab, presence of viral mutation(s) within the  areas targeted by this assay, and inadequate number of viral copies  (<250 copies / mL). A negative result must be combined with clinical  observations, patient history, and epidemiological information. If result is POSITIVE SARS-CoV-2 target nucleic acids are DETECTED. The SARS-CoV-2 RNA is generally detectable in upper and lower  respiratory specimens dur ing the acute phase of infection.  Positive  results are indicative of active infection with SARS-CoV-2.  Clinical  correlation with patient history and other diagnostic information is  necessary to determine patient infection status.  Positive results do  not rule out bacterial infection or co-infection with other viruses. If result is PRESUMPTIVE POSTIVE SARS-CoV-2 nucleic acids MAY BE PRESENT.   A presumptive positive result was obtained on the submitted specimen  and confirmed on repeat testing.  While 2019 novel coronavirus  (SARS-CoV-2) nucleic acids may be present in the submitted sample  additional confirmatory testing may be necessary for epidemiological  and / or clinical management purposes  to differentiate between  SARS-CoV-2 and other Sarbecovirus currently known to infect humans.  If clinically indicated additional testing with an alternate test  methodology [phone]) is advised. The SARS-CoV-2 RNA is generally  detectable in upper and lower respiratory sp ecimens during the acute  phase of infection. The expected result is Negative. Fact Sheet for Patients:  StrictlyIdeas.no Fact Sheet for Healthcare Providers: BankingDealers.co.za This test is not yet approved or cleared by  the [place] FDA and has been authorized for detection and/or diagnosis of SARS-CoV-2 by FDA under an Emergency Use Authorization (EUA).  This EUA will remain in effect (meaning this test can be used) for  the duration of the COVID-19 declaration under Section 564(b)(1) of the Act, 21 U.S.C. section 360bbb-3(b)(1), unless the authorization is terminated or revoked sooner. Performed at [facility], [place] [address], [place], [place] [address]   MRSA PCR Screening     Status: None   Collection Time: 07/27/18 10:22 PM   Specimen: Nasal Mucosa; Nasopharyngeal  Result Value Ref Range Status   MRSA by PCR NEGATIVE NEGATIVE Final    Comment:        The GeneXpert MRSA Assay (FDA approved for NASAL specimens only), is one component of a comprehensive MRSA colonization surveillance program. It is not intended to diagnose MRSA infection nor to guide or monitor treatment for MRSA infections. Performed at [facility], [place] [address] [place], [place] [address]          Radiology Studies: Ct Angio Chest Pe W And/or Wo Contrast  Result Date: 07/27/2018 CLINICAL DATA:  Breast and lung cancer, chest tube, shortness of breath for 3 days, tube not draining properly, elevated D-dimer, question pulmonary embolism EXAM: CT ANGIOGRAPHY CHEST WITH CONTRAST TECHNIQUE: Multidetector CT imaging of the chest was performed using the standard protocol during bolus administration of intravenous contrast. Multiplanar CT image reconstructions and MIPs were obtained to evaluate the vascular anatomy. CONTRAST:  185mL OMNIPAQUE IOHEXOL 350 MG/ML SOLN COMPARISON:  06/30/2018 FINDINGS: Cardiovascular: Atherosclerotic calcifications of thoracic aorta. Ascending aorta 4.0 cm transverse. No evidence of dissection. No pericardial effusion. Pulmonary arteries adequately opacified. Small pulmonary embolus identified within a RIGHT lower lobe pulmonary artery, medial basal segment.  No additional pulmonary emboli are definitely visualized. RIGHT jugular Port-A-Cath with tip in SVC. Few additional normal sized Mediastinum/Nodes: Probable enlarged precarinal lymph node 15 mm short axis image 33. 13 mm short axis AP window node image 36. Subcarinal adenopathy 23 mm short axis previously 22 mm. Esophagus unremarkable. Base of cervical region normal appearance. No axillary adenopathy. Prior LEFT mastectomy. Lungs/Pleura: Large loculated LEFT pleural effusion despite thoracostomy tube. Complete collapse of the LEFT upper lobe with abrupt cut off of air within LEFT upper lobe bronchus. Mild compressive atelectasis of LEFT lower lobe with narrowing of LEFT lower lobe airways and peribronchial thickening. Patchy areas of opacity in periphery of RIGHT lung, greater in RIGHT lower lobe, favor pneumonia though attention on follow-up recommended to exclude developing tumor. No pneumothorax. No RIGHT pleural effusion. Upper Abdomen: 14 mm short axis gastrohepatic ligament lymph node image 90. Visualized upper abdomen otherwise unremarkable Musculoskeletal: No acute osseous findings. Review of the MIP images confirms the above findings. IMPRESSION: Small pulmonary embolus in RIGHT lower lobe medially. Large loculated RIGHT pleural effusion with complete atelectasis of LEFT upper lobe and partial atelectasis of LEFT lower lobe. Patchy opacities in RIGHT lung favor pneumonia, slightly increased in RIGHT lower lobe since previous exam, recommend follow-up until resolution to exclude underlying mass. Persistent mediastinal adenopathy. Mildly enlarged gastrohepatic ligament lymph node. Aortic Atherosclerosis (ICD10-I70.0). Findings called to Dr. Regenia Skeeter on 07/27/2018 at 1904 hrs. Electronically Signed   By: Lavonia Dana M.D.   On: 07/27/2018 19:05   Dg Chest Portable 1 View  Result Date: 07/27/2018 CLINICAL DATA:  Dyspnea and short of breath. History of breast cancer and lung cancer. Chest tube EXAM: PORTABLE  CHEST 1 VIEW COMPARISON:  07/14/2018 FINDINGS: Progression of left effusion compared to the prior study. Left pleural drainage catheter remains in place unchanged. No pneumothorax. Compressive atelectasis or infiltrate throughout the left lung has progressed Right lung clear.  Port-A-Cath tip SVC.  IMPRESSION: Interval progression of left effusion and airspace disease on the left likely due to metastatic disease. Electronically Signed   By: Franchot Gallo M.D.   On: 07/27/2018 15:39   Vas [place] Lower Extremity Venous (dvt)  Result Date: 07/28/2018  Lower Venous Study Indications: Pulmonary embolism.  Risk Factors: Confirmed PE. Comparison Study: No prior studies. Performing Technologist: Oliver Hum RVT  Examination Guidelines: A complete evaluation includes B-mode imaging, spectral Doppler, color Doppler, and power Doppler as needed of all accessible portions of each vessel. Bilateral testing is considered an integral part of a complete examination. Limited examinations for reoccurring indications may be performed as noted.  +---------+---------------+---------+-----------+----------+-------+  RIGHT     Compressibility Phasicity Spontaneity Properties Summary  +---------+---------------+---------+-----------+----------+-------+  CFV       Full            Yes       Yes                             +---------+---------------+---------+-----------+----------+-------+  SFJ       Full                                                      +---------+---------------+---------+-----------+----------+-------+  FV Prox   Full                                                      +---------+---------------+---------+-----------+----------+-------+  FV Mid    Full                                                      +---------+---------------+---------+-----------+----------+-------+  FV Distal Full                                                      +---------+---------------+---------+-----------+----------+-------+  PFV        Full                                                      +---------+---------------+---------+-----------+----------+-------+  POP       Full            Yes       Yes                             +---------+---------------+---------+-----------+----------+-------+  PTV       Full                                                      +---------+---------------+---------+-----------+----------+-------+  PERO      Full                                                      +---------+---------------+---------+-----------+----------+-------+   +---------+---------------+---------+-----------+----------+-------+  LEFT      Compressibility Phasicity Spontaneity Properties Summary  +---------+---------------+---------+-----------+----------+-------+  CFV       Full            Yes       Yes                             +---------+---------------+---------+-----------+----------+-------+  SFJ       Full                                                      +---------+---------------+---------+-----------+----------+-------+  FV Prox   Full                                                      +---------+---------------+---------+-----------+----------+-------+  FV Mid    Full                                                      +---------+---------------+---------+-----------+----------+-------+  FV Distal Full                                                      +---------+---------------+---------+-----------+----------+-------+  PFV       Full                                                      +---------+---------------+---------+-----------+----------+-------+  POP       Full            Yes       Yes                             +---------+---------------+---------+-----------+----------+-------+  PTV       Full                                                      +---------+---------------+---------+-----------+----------+-------+  PERO      Full                                                       +---------+---------------+---------+-----------+----------+-------+  Summary: Right: There is no evidence of deep vein thrombosis in the lower extremity. No cystic structure found in the popliteal fossa. Left: There is no evidence of deep vein thrombosis in the lower extremity. No cystic structure found in the popliteal fossa.  *See table(s) above for measurements and observations. Electronically signed by Monica Martinez MD on 07/28/2018 at 5:45:05 PM.    Final    Ir Removal Of Plural Cath W/cuff  Result Date: 07/28/2018 INDICATION: Patient with history of metastatic breast cancer with recurrent left pleural effusion s/p tunneled left pleurX catheter placement in IR 06/02/2018 by Dr. Pascal Lux; with subsequent development of loculated left pleural effusion and nonfunctioning pleurX catheter. Request is made for removal of tunneled left pleurX catheter. EXAM: REMOVAL OF TUNNELED LEFT PLEURX CATHETER MEDICATIONS: 10 mL of 1% Lidocaine COMPLICATIONS: None immediate. PROCEDURE: Informed written consent was obtained from the patient following an explanation of the procedure, risks, benefits and alternatives to treatment. A time out was performed prior to the initiation of the procedure. Maximal barrier sterile technique was utilized including mask, face shield, sterile gloves, large sterile drape, hand hygiene, and Betadine. 1% lidocaine was injected under sterile conditions along the subcutaneous tunnel. Utilizing gentle traction, the catheter was removed intact. Hemostasis was obtained with manual compression. A pressure dressing was placed. The patient tolerated the procedure well without immediate post procedural complication. IMPRESSION: Successful removal of tunneled left pleurX catheter. Read by: Earley Abide, PA-C Electronically Signed   By: Jacqulynn Cadet M.D.   On: 07/28/2018 16:53        Scheduled Meds:  amLODipine  5 mg Oral Daily   apixaban  10 mg Oral BID   Followed by   Derrill Memo ON  08/03/2018] apixaban  5 mg Oral BID   Chlorhexidine Gluconate Cloth  6 each Topical Daily   dronabinol  2.5 mg Oral BID AC   feeding supplement (ENSURE ENLIVE)  237 mL Oral BID BM   feeding supplement (PRO-STAT SUGAR FREE 64)  30 mL Oral BID   gabapentin  400 mg Oral TID   mouth rinse  15 mL Mouth Rinse BID   metoprolol tartrate  25 mg Oral BID   multivitamin with minerals  1 tablet Oral Daily   nicotine  21 mg Transdermal Q24H   PARoxetine  20 mg Oral BID   potassium chloride  20 mEq Oral Daily   Continuous Infusions:  sodium chloride     levofloxacin (LEVAQUIN) IV       LOS: 2 days    Time spent: 35 minutes spent in the coordination of care today.    Jonnie Finner, DO [facility] Pager [phone]  If 7PM-7AM, please contact night-coverage [email or web address] Password [facility] 07/29/2018, 7:19 AM

## 2018-07-29 NOTE — Progress Notes (Signed)
CRITICAL VALUE ALERT  Critical Value:  WBC 1.2  Date & Time Notied:  07/29/18 0355  Provider Notified: Silas [place]  Orders Received/Actions taken: Awaiting further orders

## 2018-07-30 DIAGNOSIS — C50919 Malignant neoplasm of unspecified site of unspecified female breast: Secondary | ICD-10-CM

## 2018-07-30 DIAGNOSIS — E440 Moderate protein-calorie malnutrition: Secondary | ICD-10-CM

## 2018-07-30 LAB — CBC WITH DIFFERENTIAL/PLATELET
Abs Immature Granulocytes: 0.02 10*3/uL (ref 0.00–0.07)
Basophils Absolute: 0 10*3/uL (ref 0.0–0.1)
Basophils Relative: 1 %
Eosinophils Absolute: 0 10*3/uL (ref 0.0–0.5)
Eosinophils Relative: 2 %
HCT: 25.4 % — ABNORMAL LOW (ref 36.0–46.0)
Hemoglobin: 7.2 g/dL — ABNORMAL LOW (ref 12.0–15.0)
Immature Granulocytes: 2 %
Lymphocytes Relative: 41 %
Lymphs Abs: 0.6 10*3/uL — ABNORMAL LOW (ref 0.7–4.0)
MCH: 26.9 pg (ref 26.0–34.0)
MCHC: 28.3 g/dL — ABNORMAL LOW (ref 30.0–36.0)
MCV: 94.8 fL (ref 80.0–100.0)
Monocytes Absolute: 0.5 10*3/uL (ref 0.1–1.0)
Monocytes Relative: 40 %
Neutro Abs: 0.2 10*3/uL — ABNORMAL LOW (ref 1.7–7.7)
Neutrophils Relative %: 14 %
Platelets: 360 10*3/uL (ref 150–400)
RBC: 2.68 MIL/uL — ABNORMAL LOW (ref 3.87–5.11)
RDW: 16 % — ABNORMAL HIGH (ref 11.5–15.5)
WBC: 1.3 10*3/uL — CL (ref 4.0–10.5)
nRBC: 1.5 % — ABNORMAL HIGH (ref 0.0–0.2)

## 2018-07-30 MED ORDER — ALPRAZOLAM 0.5 MG PO TABS
0.5000 mg | ORAL_TABLET | Freq: Three times a day (TID) | ORAL | Status: DC | PRN
  Administered 2018-07-30 – 2018-08-03 (×3): 0.5 mg via ORAL
  Filled 2018-07-30 (×3): qty 1

## 2018-07-30 MED ORDER — SODIUM CHLORIDE 0.9% FLUSH
10.0000 mL | Freq: Two times a day (BID) | INTRAVENOUS | Status: DC
  Administered 2018-07-31 – 2018-08-03 (×2): 10 mL

## 2018-07-30 MED ORDER — SODIUM CHLORIDE 0.9% FLUSH
10.0000 mL | INTRAVENOUS | Status: DC | PRN
  Administered 2018-08-04: 10 mL
  Filled 2018-07-30: qty 40

## 2018-07-30 NOTE — Progress Notes (Signed)
Deborah Mcintyre Kitchen  PROGRESS NOTE    Deborah Mcintyre  TMH:[id number] DOB: [birth year] DOA: 07/27/2018 PCP: [name], MD   Brief Narrative:   Deborah Mcintyre a 63 y.o.femalewith medical history significant ofanxiety, depression, osteoarthritis, bronchitis, COPD, hypertension, nocturia, bilateral tingling and numbness of toes, history of healthcare associated pneumonia at discharged9 days ago, history of breast cancer who had a chest tube placement due to a left-sided malignant pleural effusion and is coming to the emergency department due to his lack of drainage associated with 3 days of progressively worse dyspnea and intermittent chest pain. EMS reported that the patient has sinus tachycardia but went momentarily into A. fib prior to arrival. Patient denies fever, chills, but feels fatigued, decreased appetite and sleep. She denies rhinorrhea, sore throat, or hemoptysis. She has occasional wheezing. Occasional postural dizziness, no palpitations, diaphoresis, PND, orthopnea or pitting edema of the lower extremities. Her chest pain is pleuritic. Denies abdominal pain, nausea, emesis, diarrhea, constipation, melena or hematochezia. No dysuria, frequency or hematuria. No polyuria, polydipsia, polyphagia or blurred vision.   Assessment & Plan:   Principal Problem:   Pulmonary emboli (HCC) Active Problems:   Antineoplastic chemotherapy induced pancytopenia (HCC)   Hypertension   COPD (chronic obstructive pulmonary disease) (HCC)   Anxiety and depression   Tobacco abuse   Chest pain   Hypokalemia   Malnutrition of moderate degree   Metastatic breast cancer ([place])   Pulmonary embolus - CTA chest as below - Continue supplemental oxygen. - eliquis - LE dopplers ordered  RLL PNA     - as seen on CTA PE     - she immune compromised from chemo     - hypotensive ON     - start lvq for HCAP, monitor     - continue lvq for 7 total days abx tx  Chest tube  dysfunction - spoke with PCCM about CT drainage problems; recommending discontinuing CT; IR has been consulted     - chest tube now removed  Antineoplastic chemotherapy induced pancytopenia (HCC) - Her platelets had normalized, but continues to be anemic/neutropenic. - Monitor CBC. - Follow-up with oncology as scheduled.  Hypertension - amlodipine, metoprolol     - hold BP meds given ON hypotension, monitor  COPD - Continue supplemental oxygen. - Bronchodilators as needed.  Anxiety and depression - Continue paroxetine  Tobacco abuse - Nicotine replacement therapy as needed.  Chest pain - Analgesics as needed.  Hypokalemia - replete, monitor  Continue lvq for 7 total days abx tx. Appreciate onco note. Hgb drop today. Will consider transfusion if drops below 7. Monitor. Hold fluids.   DVT prophylaxis: eliquis Code Status: FULL   Disposition Plan: TBD   Consultants:   PCCM  Palliative Care  Procedures:   CT removal  Antimicrobials:   Levaquin   Subjective: "I just don't know."  Objective: Vitals:   07/29/18 2011 07/29/18 2117 07/30/18 0632 07/30/18 1534  BP:  110/77 133/86 (!) 132/91  Pulse:  (!) 102 (!) 107 (!) 105  Resp:  18 18 20   Temp: 98.6 F (37 C) 99.1 F (37.3 C) (!) 97.5 F (36.4 C) 97.8 F (36.6 C)  TempSrc: Oral Oral Oral Oral  SpO2:  100% 100% 100%  Weight:      Height:        Intake/Output Summary (Last 24 hours) at 07/30/2018 1545 Last data filed at 07/30/2018 0600 Gross per 24 hour  Intake 1247.17 ml  Output --  Net 1247.17 ml  Filed Weights   07/27/18 1459  Weight: 72.6 kg    Examination:  General:63 y.o.femaleresting in bed in NAD Cardiovascular: RRR, +S1, S2, no m/g/r, equal pulses throughout Respiratory:decreased at left apex w/ some crackles at b/l bases;, right chest port noted GI: BS+, NDNT, no masses noted, no organomegaly noted MSK: No e/c/c Skin: No  rashes, bruises, ulcerations noted Neuro: A&O x 3, no focal deficits Psyc: Appropriate interaction and affect, calm/cooperative    Data Reviewed: I have personally reviewed following labs and imaging studies.  CBC: Recent Labs  Lab 07/27/18 1602 07/28/18 0415 07/29/18 0307 07/30/18 0500  WBC 2.3* 2.1* 1.2* 1.3*  NEUTROABS 1.4* 0.7* 0.5* 0.2*  HGB 8.6* 8.0* 8.1* 7.2*  HCT 29.1* 28.2* 28.3* 25.4*  MCV 93.6 95.6 95.9 94.8  PLT 355 336 363 563   Basic Metabolic Panel: Recent Labs  Lab 07/27/18 1600 07/27/18 1602 07/28/18 0415 07/29/18 0307  NA  --  143 141 139  K  --  3.0* 4.2 4.0  CL  --  100 105 101  CO2  --  30 27 26   GLUCOSE  --  94 101* 116*  BUN  --  6* <5* 5*  CREATININE  --  0.54 0.42* 0.58  CALCIUM  --  8.6* 7.9* 8.2*  MG 1.9  --   --   --    GFR: Estimated Creatinine Clearance: 71.4 mL/min (by C-G formula based on SCr of 0.58 mg/dL). Liver Function Tests: Recent Labs  Lab 07/27/18 1602 07/28/18 0415 07/29/18 0307  AST 13* 13* 9*  ALT 7 7 7   ALKPHOS 56 49 49  BILITOT 0.5 0.3 0.6  PROT 6.2* 6.1* 5.7*  ALBUMIN 2.6* 2.5* 2.3*   No results for input(s): LIPASE, AMYLASE in the last 168 hours. No results for input(s): AMMONIA in the last 168 hours. Coagulation Profile: No results for input(s): INR, PROTIME in the last 168 hours. Cardiac Enzymes: No results for input(s): CKTOTAL, CKMB, CKMBINDEX, TROPONINI in the last 168 hours. BNP (last 3 results) No results for input(s): PROBNP in the last 8760 hours. HbA1C: No results for input(s): HGBA1C in the last 72 hours. CBG: No results for input(s): GLUCAP in the last 168 hours. Lipid Profile: No results for input(s): CHOL, HDL, LDLCALC, TRIG, CHOLHDL, LDLDIRECT in the last 72 hours. Thyroid Function Tests: No results for input(s): TSH, T4TOTAL, FREET4, T3FREE, THYROIDAB in the last 72 hours. Anemia Panel: No results for input(s): VITAMINB12, FOLATE, FERRITIN, TIBC, IRON, RETICCTPCT in the last 72  hours. Sepsis Labs: Recent Labs  Lab 07/27/18 1602 07/27/18 1750  LATICACIDVEN 1.2 0.9    Recent Results (from the past 240 hour(s))  Culture, blood (routine x 2)     Status: None (Preliminary result)   Collection Time: 07/27/18  3:37 PM   Specimen: BLOOD  Result Value Ref Range Status   Specimen Description   Final    BLOOD RIGHT ANTECUBITAL Performed at [facility] [address], [place], [place] [address]    Special Requests   Final    BOTTLES DRAWN AEROBIC AND ANAEROBIC Blood Culture results may not be optimal due to an excessive volume of blood received in culture bottles Performed at [facility] [address], [place], [place] [address]    Culture   Final    NO GROWTH 3 DAYS Performed at [facility], [place] [address] [place],  [address]    Report Status PENDING  Incomplete  Culture, blood (routine  x 2)     Status: None (Preliminary result)   Collection Time: 07/27/18  4:02 PM   Specimen: BLOOD  Result Value Ref Range Status   Specimen Description   Final    BLOOD PORTA CATH Performed at [facility], [place] [address], [place], [place] [address]    Special Requests   Final    BOTTLES DRAWN AEROBIC AND ANAEROBIC Blood Culture adequate volume Performed at [facility] [address], [place], [place] [address]    Culture   Final    NO GROWTH 3 DAYS Performed at [facility], [place] [address], [place], [place] [address]    Report Status PENDING  Incomplete  SARS Coronavirus 2 (CEPHEID - Performed in [facility] hospital lab), Hosp Order     Status: None   Collection Time: 07/27/18  8:55 PM   Specimen: Nasopharyngeal Swab  Result Value Ref Range Status   SARS Coronavirus 2 NEGATIVE NEGATIVE Final    Comment: (NOTE) If result is NEGATIVE SARS-CoV-2 target nucleic acids are NOT DETECTED. The SARS-CoV-2 RNA is generally detectable in upper and lower  respiratory specimens during the  acute phase of infection. The lowest  concentration of SARS-CoV-2 viral copies this assay can detect is 250  copies / mL. A negative result does not preclude SARS-CoV-2 infection  and should not be used as the sole basis for treatment or other  patient management decisions.  A negative result may occur with  improper specimen collection / handling, submission of specimen other  than nasopharyngeal swab, presence of viral mutation(s) within the  areas targeted by this assay, and inadequate number of viral copies  (<250 copies / mL). A negative result must be combined with clinical  observations, patient history, and epidemiological information. If result is POSITIVE SARS-CoV-2 target nucleic acids are DETECTED. The SARS-CoV-2 RNA is generally detectable in upper and lower  respiratory specimens dur ing the acute phase of infection.  Positive  results are indicative of active infection with SARS-CoV-2.  Clinical  correlation with patient history and other diagnostic information is  necessary to determine patient infection status.  Positive results do  not rule out bacterial infection or co-infection with other viruses. If result is PRESUMPTIVE POSTIVE SARS-CoV-2 nucleic acids MAY BE PRESENT.   A presumptive positive result was obtained on the submitted specimen  and confirmed on repeat testing.  While 2019 novel coronavirus  (SARS-CoV-2) nucleic acids may be present in the submitted sample  additional confirmatory testing may be necessary for epidemiological  and / or clinical management purposes  to differentiate between  SARS-CoV-2 and other Sarbecovirus currently known to infect humans.  If clinically indicated additional testing with an alternate test  methodology [phone]) is advised. The SARS-CoV-2 RNA is generally  detectable in upper and lower respiratory sp ecimens during the acute  phase of infection. The expected result is Negative. Fact Sheet for Patients:   StrictlyIdeas.no Fact Sheet for Healthcare Providers: BankingDealers.co.za This test is not yet approved or cleared by the [place] FDA and has been authorized for detection and/or diagnosis of SARS-CoV-2 by FDA under an Emergency Use Authorization (EUA).  This EUA will remain in effect (meaning this test can be used) for the duration of the COVID-19 declaration under Section 564(b)(1) of the Act, 21 U.S.C. section 360bbb-3(b)(1), unless the authorization is terminated or revoked sooner. Performed at [facility], [place] [address] [place], [place] [address]   MRSA PCR Screening  Status: None   Collection Time: 07/27/18 10:22 PM   Specimen: Nasal Mucosa; Nasopharyngeal  Result Value Ref Range Status   MRSA by PCR NEGATIVE NEGATIVE Final    Comment:        The GeneXpert MRSA Assay (FDA approved for NASAL specimens only), is one component of a comprehensive MRSA colonization surveillance program. It is not intended to diagnose MRSA infection nor to guide or monitor treatment for MRSA infections. Performed at [facility], [place] [address] [place], [place] [address]          Radiology Studies: Ir Removal Of Plural Cath W/cuff  Result Date: 07/28/2018 INDICATION: Patient with history of metastatic breast cancer with recurrent left pleural effusion s/p tunneled left pleurX catheter placement in IR 06/02/2018 by Dr. Pascal Lux; with subsequent development of loculated left pleural effusion and nonfunctioning pleurX catheter. Request is made for removal of tunneled left pleurX catheter. EXAM: REMOVAL OF TUNNELED LEFT PLEURX CATHETER MEDICATIONS: 10 mL of 1% Lidocaine COMPLICATIONS: None immediate. PROCEDURE: Informed written consent was obtained from the patient following an explanation of the procedure, risks, benefits and alternatives to treatment. A time out was performed prior to the initiation of  the procedure. Maximal barrier sterile technique was utilized including mask, face shield, sterile gloves, large sterile drape, hand hygiene, and Betadine. 1% lidocaine was injected under sterile conditions along the subcutaneous tunnel. Utilizing gentle traction, the catheter was removed intact. Hemostasis was obtained with manual compression. A pressure dressing was placed. The patient tolerated the procedure well without immediate post procedural complication. IMPRESSION: Successful removal of tunneled left pleurX catheter. Read by: Earley Abide, PA-C Electronically Signed   By: Jacqulynn Cadet M.D.   On: 07/28/2018 16:53        Scheduled Meds:  apixaban  10 mg Oral BID   Followed by   Derrill Memo ON 08/03/2018] apixaban  5 mg Oral BID   Chlorhexidine Gluconate Cloth  6 each Topical Daily   dronabinol  2.5 mg Oral BID AC   feeding supplement (ENSURE ENLIVE)  237 mL Oral BID BM   feeding supplement (PRO-STAT SUGAR FREE 64)  30 mL Oral BID   gabapentin  400 mg Oral TID   mouth rinse  15 mL Mouth Rinse BID   multivitamin with minerals  1 tablet Oral Daily   nicotine  21 mg Transdermal Q24H   PARoxetine  20 mg Oral BID   potassium chloride  20 mEq Oral Daily   sodium chloride flush  10-40 mL Intracatheter Q12H   Continuous Infusions:  sodium chloride 75 mL/hr at 07/30/18 1107   levofloxacin (LEVAQUIN) IV 750 mg (07/30/18 1115)     LOS: 3 days    Time spent: 25 minutes spent in the coordination of care today.    Jonnie Finner, DO [facility] Pager [phone]  If 7PM-7AM, please contact night-coverage [email or web address] Password [facility] 07/30/2018, 3:45 PM

## 2018-07-31 DIAGNOSIS — Z7189 Other specified counseling: Secondary | ICD-10-CM

## 2018-07-31 DIAGNOSIS — Z515 Encounter for palliative care: Secondary | ICD-10-CM

## 2018-07-31 DIAGNOSIS — J90 Pleural effusion, not elsewhere classified: Secondary | ICD-10-CM

## 2018-07-31 LAB — CBC WITH DIFFERENTIAL/PLATELET
Abs Immature Granulocytes: 0.05 10*3/uL (ref 0.00–0.07)
Basophils Absolute: 0 10*3/uL (ref 0.0–0.1)
Basophils Relative: 1 %
Eosinophils Absolute: 0 10*3/uL (ref 0.0–0.5)
Eosinophils Relative: 2 %
HCT: 27.3 % — ABNORMAL LOW (ref 36.0–46.0)
Hemoglobin: 7.5 g/dL — ABNORMAL LOW (ref 12.0–15.0)
Immature Granulocytes: 3 %
Lymphocytes Relative: 42 %
Lymphs Abs: 0.7 10*3/uL (ref 0.7–4.0)
MCH: 26.2 pg (ref 26.0–34.0)
MCHC: 27.5 g/dL — ABNORMAL LOW (ref 30.0–36.0)
MCV: 95.5 fL (ref 80.0–100.0)
Monocytes Absolute: 0.6 10*3/uL (ref 0.1–1.0)
Monocytes Relative: 37 %
Neutro Abs: 0.2 10*3/uL — ABNORMAL LOW (ref 1.7–7.7)
Neutrophils Relative %: 15 %
Platelets: 425 10*3/uL — ABNORMAL HIGH (ref 150–400)
RBC: 2.86 MIL/uL — ABNORMAL LOW (ref 3.87–5.11)
RDW: 16.2 % — ABNORMAL HIGH (ref 11.5–15.5)
WBC: 1.6 10*3/uL — ABNORMAL LOW (ref 4.0–10.5)
nRBC: 2.6 % — ABNORMAL HIGH (ref 0.0–0.2)

## 2018-07-31 LAB — RENAL FUNCTION PANEL
Albumin: 2.2 g/dL — ABNORMAL LOW (ref 3.5–5.0)
Anion gap: 9 (ref 5–15)
BUN: 8 mg/dL (ref 8–23)
CO2: 27 mmol/L (ref 22–32)
Calcium: 8.1 mg/dL — ABNORMAL LOW (ref 8.9–10.3)
Chloride: 104 mmol/L (ref 98–111)
Creatinine, Ser: 0.52 mg/dL (ref 0.44–1.00)
GFR calc Af Amer: >60 mL/min (ref >60)
GFR calc non Af Amer: >60 mL/min (ref >60)
Glucose, Bld: 94 mg/dL (ref 70–99)
Phosphorus: 3.4 mg/dL (ref 2.5–4.6)
Potassium: 3.8 mmol/L (ref 3.5–5.1)
Sodium: 140 mmol/L (ref 135–145)

## 2018-07-31 LAB — MAGNESIUM: Magnesium: 1.7 mg/dL (ref 1.7–2.4)

## 2018-07-31 MED ORDER — LEVOFLOXACIN 750 MG PO TABS
750.0000 mg | ORAL_TABLET | Freq: Every day | ORAL | Status: AC
  Administered 2018-08-01 – 2018-08-04 (×4): 750 mg via ORAL
  Filled 2018-07-31 (×4): qty 1

## 2018-07-31 NOTE — Consult Note (Signed)
Consultation Note Date: 07/31/2018   Patient Name: Deborah Mcintyre  DOB: 03-20-1955  MRN: [id number]  Age / Sex: 63 y.o., female  PCP: [name], MD Referring Physician: Jonnie Finner, DO  Reason for Consultation: Establishing goals of care and Psychosocial/spiritual support  HPI/Patient Profile: 63 y.o. 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   Admitted with acute pulmonary emboli but also noted to have decreased pleural drainage over the last 3 weeks.  Pulmonary asked to evaluate to make recommendations in regards to Pleurx management.  Per the patient continued physical and functional decline  PMH significant for anxiety, depression, osteoarthritis, bronchitis, COPD, hypertension, nocturia, bilateral tingling and numbness of toes, history of healthcare associated pneumonia    Per Dr Arnoldo Lenis note/oncology chemo does not appear to be helping and he is recommending hospice care.  Patient and family face treatment option decisions, advanced directive and anticipatory care needs    Clinical Assessment and Goals of Care:   This NP Wadie Lessen reviewed medical records, received report from team, assessed the patient and then meet at the patient's bedside   to discuss diagnosis, prognosis, GOC, EOL wishes disposition and options.  I spoke to her Son/Deborah "Mendel Ryder" Mcintyre # [phone] one of her four children, at the patient's request and then I spoke to her husband also.  I spoke with both of them again regarding diagnosis, prognosis, goals of care and end-of-life wishes.  We discussed disposition and anticipatory care needs.  Both the patient's husband and son understand the seriousness of the current medical situation and the limited prognosis and can support  hospice services however the patient herself is unable at this time to verbalize any change in treatment plan and wants to "think about things"  Her son verbalizes concern and frustration and the fact that the patient tends to "keep things from him" and "does not make the best choices for herself "according to him.  He will try to help however he can, and how his mother allows him in.  Concept of Hospice and Palliative Care were discussed  A detailed discussion was had today regarding advanced directives.  Concepts specific to code status, artifical feeding and hydration, continued IV antibiotics and rehospitalization was had.  The difference between a aggressive medical intervention path  and a palliative comfort care path for this patient at this time was had.  Values and goals of care important to patient and family were attempted to be elicited.  I will f/u with this patient and family again tomorrow to help clarify [place] and disposition.    Questions and concerns addressed.   Family encouraged to call with questions or concerns.    PMT will continue to support holistically.       PATIENT    SUMMARY OF RECOMMENDATIONS    Code Status/Advance Care Planning:  Full code--encouraged patient to consider DNR/DNI status knowing poor outcomes in similar patients.  Symptom Management:   Pain: Continue oxycodone IR 10 mg p.o. every 4 hours as needed  Palliative Prophylaxis:   Aspiration, Bowel Regimen, Frequent Pain Assessment and Oral Care  Additional Recommendations (Limitations, Scope, Preferences):  Full Scope Treatment  Psycho-social/Spiritual:   Desire for further Chaplaincy support:no-declined  Additional Recommendations: Education on Hospice  Prognosis:   < 6 months  Discharge Planning: To Be Determined      Primary Diagnoses: Present on Admission: . Pulmonary emboli ([place]) . Anxiety and depression . COPD (chronic obstructive pulmonary disease) ([place]) .  Hypertension . Tobacco abuse . Hypokalemia . Chest pain . Antineoplastic chemotherapy induced pancytopenia ([place])   I have reviewed the medical record, interviewed the patient and family, and examined the patient. The following aspects are pertinent.  Past Medical History:  Diagnosis Date  . Anxiety   . Arthritis   . Bronchitis   . COPD (chronic obstructive pulmonary disease) ([place])   . Depression   . Hypertension   . Malignant neoplasm of upper-outer quadrant of left female breast ([place]) 01/04/2015  . Nocturia   . Numbness and tingling    toes - bilateral  . Pneumonia   . PONV (postoperative nausea and vomiting)    Nausea   Social History   Socioeconomic History  . Marital status: Single    Spouse name: Not on file  . Number of children: Not on file  . Years of education: Not on file  . Highest education level: Not on file  Occupational History  . Not on file  Social Needs  . Financial resource strain: Not on file  . Food insecurity    Worry: Not on file    Inability: Not on file  . Transportation needs    Medical: Not on file    Non-medical: Not on file  Tobacco Use  . Smoking status: Current Every Day Smoker    Packs/day: 0.75    Years: 45.00    Pack years: 33.75    Types: Cigarettes  . Smokeless tobacco: Never Used  Substance and Sexual Activity  . Alcohol use: Yes    Comment: ocassionally  . Drug use: No  . Sexual activity: Not on file  Lifestyle  . Physical activity    Days per week: Not on file    Minutes per session: Not on file  . Stress: Not on file  Relationships  . Social Herbalist on phone: Not on file    Gets together: Not on file    Attends religious service: Not on file    Active member of club or organization: Not on file    Attends meetings of clubs or organizations: Not on file    Relationship status: Not on file  Other Topics Concern  . Not on file  Social History Narrative  . Not on file   Family History  Problem  Relation Age of Onset  . Breast cancer Maternal Aunt    Scheduled Meds: . apixaban  10 mg Oral BID   Followed by  . [START ON 08/03/2018] apixaban  5 mg Oral BID  . Chlorhexidine Gluconate Cloth  6 each Topical Daily  . dronabinol  2.5 mg Oral BID AC  . feeding supplement (ENSURE ENLIVE)  237 mL Oral BID BM  . feeding supplement (PRO-STAT SUGAR FREE 64)  30 mL Oral BID  . gabapentin  400 mg Oral TID  . [START ON 08/01/2018] levofloxacin  750 mg Oral Daily  . mouth rinse  15 mL Mouth Rinse BID  . multivitamin with minerals  1 tablet Oral Daily  . nicotine  21 mg Transdermal Q24H  . PARoxetine  20 mg Oral BID  . potassium chloride  20 mEq Oral Daily  . sodium chloride flush  10-40 mL Intracatheter Q12H   Continuous Infusions: . sodium chloride 75 mL/hr at 07/31/18 0600  . levofloxacin (LEVAQUIN) IV Stopped (07/30/18 1245)   PRN Meds:.acetaminophen **OR** acetaminophen, albuterol, ALPRAZolam, lidocaine, loperamide, oxyCODONE, prochlorperazine, promethazine, sodium chloride flush Medications Prior to Admission:  Prior to Admission medications   Medication Sig Start Date End Date Taking? Authorizing Provider  albuterol (PROVENTIL HFA;VENTOLIN HFA) 108 (90 Base) MCG/ACT inhaler Inhale 2 puffs into the lungs every 6 (six) hours as needed for wheezing or shortness of breath.    Yes [provider]  albuterol (PROVENTIL) (2.5 MG/3ML) 0.083% nebulizer solution Take 3 mLs (2.5 mg total) by nebulization every 6 (six) hours as needed for up to 30 days for wheezing or shortness of breath. 07/02/18 08/01/18 Yes Alma Friendly, MD  amLODipine (NORVASC) 5 MG tablet Take 1 tablet (5 mg total) by mouth daily. 05/16/18  Yes Regalado, Belkys A, MD  aspirin EC 81 MG tablet Take 81 mg by mouth daily.   Yes [provider]  dronabinol (MARINOL) 2.5 MG capsule Take 1 capsule (2.5 mg total) by mouth 2 (two) times daily before a meal. 07/21/18  Yes Causey, Charlestine Massed, NP  gabapentin  (NEURONTIN) 400 MG capsule Take 1 capsule (400 mg total) by mouth 3 (three) times daily. 07/17/18  Yes Kayleen Memos, DO  lidocaine-prilocaine (EMLA) cream Apply to affected area once Patient taking differently: Apply 1 application topically daily as needed (port access).  05/19/18  Yes Nicholas Lose, MD  metoprolol tartrate (LOPRESSOR) 25 MG tablet Take 1 tablet (25 mg total) by mouth 2 (two) times daily. 06/02/18  Yes Georgette Shell, MD  Multiple Vitamin (MULTIVITAMIN WITH MINERALS) TABS tablet Take 1 tablet by mouth daily. 07/18/18  Yes Hall, Carole N, DO  nicotine (NICODERM CQ - DOSED IN MG/24 HOURS) 21 mg/24hr patch Place 1 patch (21 mg total) onto the skin daily. 07/19/18  Yes Nicholas Lose, MD  nystatin (MYCOSTATIN) 100000 UNIT/ML suspension Use as directed 5 mLs (500,000 Units total) in the mouth or throat 4 (four) times daily. 07/17/18  Yes Hall, Carole N, DO  ondansetron (ZOFRAN) 8 MG tablet Take 1 tablet (8 mg total) by mouth every 8 (eight) hours as needed for nausea. 09/23/17  Yes Nicholas Lose, MD  oxyCODONE-acetaminophen (PERCOCET/ROXICET) 5-325 MG tablet Take 1 tablet by mouth every 8 (eight) hours as needed for moderate pain or severe pain (1 tab every 4-6 hour prn pain). 07/19/18  Yes Nicholas Lose, MD  PARoxetine (PAXIL) 20 MG tablet Take 1 tablet (20 mg total) by mouth 2 (two) times daily. 05/15/18  Yes Regalado, Belkys A, MD  potassium chloride (K-DUR) 10 MEQ tablet Take 2 tablets (20 mEq total) by mouth daily. 07/21/18  Yes Causey, Charlestine Massed, NP  ALPRAZolam Duanne Moron) 0.5 MG tablet Take 1 tablet (0.5 mg total) by mouth 3 (three) times daily as needed for anxiety or sleep. Patient not taking: Reported on 07/27/2018 05/15/18   Regalado, Jerald Kief A, MD  benzonatate (TESSALON) 200 MG capsule Take 1 capsule (200 mg total) by mouth 2 (two) times daily as needed for cough. Patient not taking: Reported on 07/27/2018 07/17/18   Kayleen Memos, DO  dexamethasone (DECADRON) 1 MG tablet Take 1  tablet (1 mg total) by mouth daily. Patient not taking: Reported on 07/27/2018 06/17/18   Nicholas Lose, MD  PREMARIN vaginal cream INSERT 1 APPLICATORFUL VAGINALLY DAILY Patient taking differently: Place 1 Applicatorful vaginally daily as needed (dryness).  04/19/17   Nicholas Lose, MD  prochlorperazine (COMPAZINE) 10 MG tablet Take 1 tablet (10 mg total) by mouth every 6 (six) hours as needed (Nausea or vomiting). Patient taking differently: Take 10 mg by mouth every 6 (six) hours as needed for nausea or vomiting.  05/19/18   Nicholas Lose, MD  promethazine (PHENERGAN) 12.5 MG tablet Take 1 tablet (12.5 mg total) by mouth every 6 (six) hours as needed for nausea or vomiting. 07/19/18   Nicholas Lose, MD   Allergies  Allergen Reactions  . Fentanyl Nausea And Vomiting and Other (See Comments)    Headache  . Morphine And Related Nausea And Vomiting and Other (See Comments)    Headache   Review of Systems  Constitutional: Positive for fatigue.  Respiratory: Positive for shortness of breath.   Neurological: Positive for weakness.    Physical Exam  Vital Signs: BP 117/78 (BP Location: Right Arm)   Pulse (!) 105   Temp 98.6 F (37 C) (Oral)   Resp 16   Ht 5' 7.5" (1.715 m)   Wt 72.6 kg   SpO2 100%   BMI 24.69 kg/m  Pain Scale: 0-10   Pain Score: Asleep   SpO2: SpO2: 100 % O2 Device:SpO2: 100 % O2 Flow Rate: .O2 Flow Rate (L/min): 2 L/min  IO: Intake/output summary:   Intake/Output Summary (Last 24 hours) at 07/31/2018 0901 Last data filed at 07/31/2018 0600 Gross per 24 hour  Intake 2042.88 ml  Output -  Net 2042.88 ml    LBM: Last BM Date: 07/30/18 Baseline Weight: Weight: 72.6 kg Most recent weight: Weight: 72.6 kg      Palliative Assessment/Data: 40%   Discussed with Dr Marylyn Ishihara  Time In: 0720 Time Out: 0830 Time Total: 70 minutes Greater than 50%  of this time was spent counseling and coordinating care related to the above assessment and plan.  Signed by: Wadie Lessen, NP   Please contact Palliative Medicine Team phone at [phone] for questions and concerns.  For individual provider: See Shea Evans

## 2018-07-31 NOTE — Progress Notes (Signed)
Deborah Mcintyre  PROGRESS NOTE    [name]  OXB:[id number] DOB: Sep 25, 1955 DOA: 07/27/2018 PCP: [name], MD   Brief Narrative:   Deborah Mcintyre a 63 y.o.femalewith medical history significant ofanxiety, depression, osteoarthritis, bronchitis, COPD, hypertension, nocturia, bilateral tingling and numbness of toes, history of healthcare associated pneumonia at discharged9 days ago, history of breast cancer who had a chest tube placement due to a left-sided malignant pleural effusion and is coming to the emergency department due to his lack of drainage associated with 3 days of progressively worse dyspnea and intermittent chest pain. EMS reported that the patient has sinus tachycardia but went momentarily into A. fib prior to arrival. Patient denies fever, chills, but feels fatigued, decreased appetite and sleep. She denies rhinorrhea, sore throat, or hemoptysis. She has occasional wheezing. Occasional postural dizziness, no palpitations, diaphoresis, PND, orthopnea or pitting edema of the lower extremities. Her chest pain is pleuritic. Denies abdominal pain, nausea, emesis, diarrhea, constipation, melena or hematochezia. No dysuria, frequency or hematuria. No polyuria, polydipsia, polyphagia or blurred vision.   Assessment & Plan:   Principal Problem:   Pulmonary emboli (HCC) Active Problems:   Antineoplastic chemotherapy induced pancytopenia (HCC)   Hypertension   COPD (chronic obstructive pulmonary disease) (HCC)   Anxiety and depression   DNR (do not resuscitate) discussion   Tobacco abuse   Chest pain   Pleural effusion on left   Hypokalemia   Malnutrition of moderate degree   Metastatic breast cancer ([place])   Palliative care by specialist   Pulmonary embolus - CTA chest as below - Continue supplemental oxygen. - eliquis - LE dopplers ordered; negative b/l  RLL PNA - as seen on CTA PE - she immune compromised from chemo -  hypotensive ON - start lvq for HCAP, monitor     - continue lvq for 7 total days abx tx; transition to PO lvq in AM  Chest tube dysfunction - spoke with PCCM about CT drainage problems; recommending discontinuing CT; IR has been consulted - chest tube now removed  Antineoplastic chemotherapy induced pancytopenia (HCC) - Her platelets had normalized, but continues to be anemic/neutropenic. - Monitor CBC. - Follow-up with oncology as scheduled.  Hypertension - amlodipine, metoprolol - hold BP meds given ON hypotension, monitor  COPD - Continue supplemental oxygen. - Bronchodilators as needed.  Anxiety and depression - Continue paroxetine  Tobacco abuse - Nicotine replacement therapy as needed.  Chest pain - Analgesics as needed.  Hypokalemia - replete, monitor  Transition to Po abx. Looking better today. Still not quite comfortable on RA. I think we may be able to d/c in AM. Appreciate palliative care assistance.   DVT prophylaxis:eliquis Code Status:FULL Disposition Plan:TBD   Consultants:  PCCM  Palliative Care  Procedures:  CT removal  Antimicrobials:  Levaquin  Subjective: "I don't know what to do about it. I want to think about it."  Objective: Vitals:   07/30/18 1534 07/30/18 2110 07/31/18 0628 07/31/18 1357  BP: (!) 132/91 131/82 117/78 (!) 136/100  Pulse: (!) 105 (!) 110 (!) 105 (!) 108  Resp: 20 18 16 20   Temp: 97.8 F (36.6 C) 98.1 F (36.7 C) 98.6 F (37 C) 98.7 F (37.1 C)  TempSrc: Oral Oral Oral Oral  SpO2: 100% 96% 100% 100%  Weight:      Height:        Intake/Output Summary (Last 24 hours) at 07/31/2018 1425 Last data filed at 07/31/2018 0600 Gross per 24 hour  Intake 1458.97 ml  Output -  Net 1458.97 ml   Filed Weights   07/27/18 1459  Weight: 72.6 kg    Examination:  General:63 y.o.femaleresting in bed in NAD Cardiovascular: RRR, +S1,  S2, no m/g/r, equal pulses throughout Respiratory:decreased in right/left apex, crackles across bases; right chest port noted GI: BS+, NDNT, no masses noted, no organomegaly noted MSK: No e/c/c Skin: No rashes, bruises, ulcerations noted Neuro: A&O x 3, no focal deficits Psyc: Appropriate interaction and affect, calm/cooperative    Data Reviewed: I have personally reviewed following labs and imaging studies.  CBC: Recent Labs  Lab 07/27/18 1602 07/28/18 0415 07/29/18 0307 07/30/18 0500 07/31/18 0417  WBC 2.3* 2.1* 1.2* 1.3* 1.6*  NEUTROABS 1.4* 0.7* 0.5* 0.2* 0.2*  HGB 8.6* 8.0* 8.1* 7.2* 7.5*  HCT 29.1* 28.2* 28.3* 25.4* 27.3*  MCV 93.6 95.6 95.9 94.8 95.5  PLT 355 336 363 360 196*   Basic Metabolic Panel: Recent Labs  Lab 07/27/18 1600 07/27/18 1602 07/28/18 0415 07/29/18 0307 07/31/18 0500 07/31/18 0502  NA  --  143 141 139 140  --   K  --  3.0* 4.2 4.0 3.8  --   CL  --  100 105 101 104  --   CO2  --  30 27 26 27   --   GLUCOSE  --  94 101* 116* 94  --   BUN  --  6* <5* 5* 8  --   CREATININE  --  0.54 0.42* 0.58 0.52  --   CALCIUM  --  8.6* 7.9* 8.2* 8.1*  --   MG 1.9  --   --   --   --  1.7  PHOS  --   --   --   --  3.4  --    GFR: Estimated Creatinine Clearance: 71.4 mL/min (by C-G formula based on SCr of 0.52 mg/dL). Liver Function Tests: Recent Labs  Lab 07/27/18 1602 07/28/18 0415 07/29/18 0307 07/31/18 0500  AST 13* 13* 9*  --   ALT 7 7 7   --   ALKPHOS 56 49 49  --   BILITOT 0.5 0.3 0.6  --   PROT 6.2* 6.1* 5.7*  --   ALBUMIN 2.6* 2.5* 2.3* 2.2*   No results for input(s): LIPASE, AMYLASE in the last 168 hours. No results for input(s): AMMONIA in the last 168 hours. Coagulation Profile: No results for input(s): INR, PROTIME in the last 168 hours. Cardiac Enzymes: No results for input(s): CKTOTAL, CKMB, CKMBINDEX, TROPONINI in the last 168 hours. BNP (last 3 results) No results for input(s): PROBNP in the last 8760 hours. HbA1C: No  results for input(s): HGBA1C in the last 72 hours. CBG: No results for input(s): GLUCAP in the last 168 hours. Lipid Profile: No results for input(s): CHOL, HDL, LDLCALC, TRIG, CHOLHDL, LDLDIRECT in the last 72 hours. Thyroid Function Tests: No results for input(s): TSH, T4TOTAL, FREET4, T3FREE, THYROIDAB in the last 72 hours. Anemia Panel: No results for input(s): VITAMINB12, FOLATE, FERRITIN, TIBC, IRON, RETICCTPCT in the last 72 hours. Sepsis Labs: Recent Labs  Lab 07/27/18 1602 07/27/18 1750  LATICACIDVEN 1.2 0.9    Recent Results (from the past 240 hour(s))  Culture, blood (routine x 2)     Status: None (Preliminary result)   Collection Time: 07/27/18  3:37 PM   Specimen: BLOOD  Result Value Ref Range Status   Specimen Description   Final    BLOOD RIGHT ANTECUBITAL Performed at [facility] [place] [address]  [place], [place] [address]    Special Requests   Final    BOTTLES DRAWN AEROBIC AND ANAEROBIC Blood Culture results may not be optimal due to an excessive volume of blood received in culture bottles Performed at [facility] [address], [place], [place] [address]    Culture   Final    NO GROWTH 4 DAYS Performed at [facility], [place] [address], [place], [place] [address]    Report Status PENDING  Incomplete  Culture, blood (routine x 2)     Status: None (Preliminary result)   Collection Time: 07/27/18  4:02 PM   Specimen: BLOOD  Result Value Ref Range Status   Specimen Description   Final    BLOOD PORTA CATH Performed at [facility], [place] [address], [place], [place] [address]    Special Requests   Final    BOTTLES DRAWN AEROBIC AND ANAEROBIC Blood Culture adequate volume Performed at [facility] [address], [place], [place] [address]    Culture   Final    NO GROWTH 4 DAYS Performed at [facility], [place] [address], [place], [place] [address]    Report Status PENDING   Incomplete  SARS Coronavirus 2 (CEPHEID - Performed in [facility] hospital lab), Hosp Order     Status: None   Collection Time: 07/27/18  8:55 PM   Specimen: Nasopharyngeal Swab  Result Value Ref Range Status   SARS Coronavirus 2 NEGATIVE NEGATIVE Final    Comment: (NOTE) If result is NEGATIVE SARS-CoV-2 target nucleic acids are NOT DETECTED. The SARS-CoV-2 RNA is generally detectable in upper and lower  respiratory specimens during the acute phase of infection. The lowest  concentration of SARS-CoV-2 viral copies this assay can detect is 250  copies / mL. A negative result does not preclude SARS-CoV-2 infection  and should not be used as the sole basis for treatment or other  patient management decisions.  A negative result may occur with  improper specimen collection / handling, submission of specimen other  than nasopharyngeal swab, presence of viral mutation(s) within the  areas targeted by this assay, and inadequate number of viral copies  (<250 copies / mL). A negative result must be combined with clinical  observations, patient history, and epidemiological information. If result is POSITIVE SARS-CoV-2 target nucleic acids are DETECTED. The SARS-CoV-2 RNA is generally detectable in upper and lower  respiratory specimens dur ing the acute phase of infection.  Positive  results are indicative of active infection with SARS-CoV-2.  Clinical  correlation with patient history and other diagnostic information is  necessary to determine patient infection status.  Positive results do  not rule out bacterial infection or co-infection with other viruses. If result is PRESUMPTIVE POSTIVE SARS-CoV-2 nucleic acids MAY BE PRESENT.   A presumptive positive result was obtained on the submitted specimen  and confirmed on repeat testing.  While 2019 novel coronavirus  (SARS-CoV-2) nucleic acids may be present in the submitted sample  additional confirmatory testing may be necessary for  epidemiological  and / or clinical management purposes  to differentiate between  SARS-CoV-2 and other Sarbecovirus currently known to infect humans.  If clinically indicated additional testing with an alternate test  methodology [phone]) is advised. The SARS-CoV-2 RNA is generally  detectable in upper and lower respiratory sp ecimens during the acute  phase of infection. The expected result is Negative. Fact Sheet for Patients:  StrictlyIdeas.no Fact Sheet for Healthcare Providers:  BankingDealers.co.za This test is not yet approved or cleared by the [place] and has been authorized for detection and/or diagnosis of SARS-CoV-2 by FDA under an Emergency Use Authorization (EUA).  This EUA will remain in effect (meaning this test can be used) for the duration of the COVID-19 declaration under Section 564(b)(1) of the Act, 21 U.S.C. section 360bbb-3(b)(1), unless the authorization is terminated or revoked sooner. Performed at [facility], [place] [address] [place], [place] [address]   MRSA PCR Screening     Status: None   Collection Time: 07/27/18 10:22 PM   Specimen: Nasal Mucosa; Nasopharyngeal  Result Value Ref Range Status   MRSA by PCR NEGATIVE NEGATIVE Final    Comment:        The GeneXpert MRSA Assay (FDA approved for NASAL specimens only), is one component of a comprehensive MRSA colonization surveillance program. It is not intended to diagnose MRSA infection nor to guide or monitor treatment for MRSA infections. Performed at [facility], [place] [address] [place], [place] [address]          Radiology Studies: No results found.      Scheduled Meds: . apixaban  10 mg Oral BID   Followed by  . [START ON 08/03/2018] apixaban  5 mg Oral BID  . Chlorhexidine Gluconate Cloth  6 each Topical Daily  . dronabinol  2.5 mg Oral BID AC  . feeding supplement (ENSURE ENLIVE)  237  mL Oral BID BM  . feeding supplement (PRO-STAT SUGAR FREE 64)  30 mL Oral BID  . gabapentin  400 mg Oral TID  . [START ON 08/01/2018] levofloxacin  750 mg Oral Daily  . mouth rinse  15 mL Mouth Rinse BID  . multivitamin with minerals  1 tablet Oral Daily  . nicotine  21 mg Transdermal Q24H  . PARoxetine  20 mg Oral BID  . potassium chloride  20 mEq Oral Daily  . sodium chloride flush  10-40 mL Intracatheter Q12H   Continuous Infusions: . sodium chloride 75 mL/hr at 07/31/18 0600     LOS: 4 days    Time spent: 25 minutes spent in the coordination of care today.    Jonnie Finner, DO [facility] Pager [phone]  If 7PM-7AM, please contact night-coverage [email or web address] Password [email or web address] 07/31/2018, 2:25 PM

## 2018-08-01 DIAGNOSIS — R531 Weakness: Secondary | ICD-10-CM

## 2018-08-01 LAB — CBC WITH DIFFERENTIAL/PLATELET
Abs Immature Granulocytes: 0 10*3/uL (ref 0.00–0.07)
Basophils Absolute: 0 10*3/uL (ref 0.0–0.1)
Basophils Relative: 0 %
Eosinophils Absolute: 0.1 10*3/uL (ref 0.0–0.5)
Eosinophils Relative: 4 %
HCT: 25.7 % — ABNORMAL LOW (ref 36.0–46.0)
Hemoglobin: 7 g/dL — ABNORMAL LOW (ref 12.0–15.0)
Lymphocytes Relative: 37 %
Lymphs Abs: 0.7 10*3/uL (ref 0.7–4.0)
MCH: 26.1 pg (ref 26.0–34.0)
MCHC: 27.2 g/dL — ABNORMAL LOW (ref 30.0–36.0)
MCV: 95.9 fL (ref 80.0–100.0)
Monocytes Absolute: 0.5 10*3/uL (ref 0.1–1.0)
Monocytes Relative: 26 %
Myelocytes: 1 %
Neutro Abs: 0.6 10*3/uL — ABNORMAL LOW (ref 1.7–7.7)
Neutrophils Relative %: 32 %
Platelets: 396 10*3/uL (ref 150–400)
RBC: 2.68 MIL/uL — ABNORMAL LOW (ref 3.87–5.11)
RDW: 16.4 % — ABNORMAL HIGH (ref 11.5–15.5)
WBC: 1.8 10*3/uL — ABNORMAL LOW (ref 4.0–10.5)
nRBC: 2.3 % — ABNORMAL HIGH (ref 0.0–0.2)

## 2018-08-01 LAB — CULTURE, BLOOD (ROUTINE X 2)
Culture: NO GROWTH
Culture: NO GROWTH
Special Requests: ADEQUATE

## 2018-08-01 LAB — RENAL FUNCTION PANEL
Albumin: 2.3 g/dL — ABNORMAL LOW (ref 3.5–5.0)
Anion gap: 9 (ref 5–15)
BUN: 8 mg/dL (ref 8–23)
CO2: 32 mmol/L (ref 22–32)
Calcium: 8.3 mg/dL — ABNORMAL LOW (ref 8.9–10.3)
Chloride: 99 mmol/L (ref 98–111)
Creatinine, Ser: 0.6 mg/dL (ref 0.44–1.00)
GFR calc Af Amer: >60 mL/min (ref >60)
GFR calc non Af Amer: >60 mL/min (ref >60)
Glucose, Bld: 105 mg/dL — ABNORMAL HIGH (ref 70–99)
Phosphorus: 3.2 mg/dL (ref 2.5–4.6)
Potassium: 4.1 mmol/L (ref 3.5–5.1)
Sodium: 140 mmol/L (ref 135–145)

## 2018-08-01 LAB — HEMOGLOBIN AND HEMATOCRIT, BLOOD
HCT: 29 % — ABNORMAL LOW (ref 36.0–46.0)
Hemoglobin: 8.4 g/dL — ABNORMAL LOW (ref 12.0–15.0)

## 2018-08-01 LAB — MAGNESIUM: Magnesium: 1.8 mg/dL (ref 1.7–2.4)

## 2018-08-01 LAB — PREPARE RBC (CROSSMATCH)

## 2018-08-01 MED ORDER — SODIUM CHLORIDE 0.9% IV SOLUTION
Freq: Once | INTRAVENOUS | Status: AC
  Administered 2018-08-01: 17:00:00 via INTRAVENOUS

## 2018-08-01 MED ORDER — ALUM & MAG HYDROXIDE-SIMETH 200-200-20 MG/5ML PO SUSP
15.0000 mL | Freq: Once | ORAL | Status: AC
  Administered 2018-08-01: 15 mL via ORAL
  Filled 2018-08-01: qty 30

## 2018-08-01 NOTE — Progress Notes (Signed)
Patient ID: Deborah Mcintyre, female   DOB: 07/21/1955, 63 y.o.   MRN: [id number]  This NP visited patient at the bedside as a follow up to  yesterday's [place].  Created space and opportunity for patient to explore her thoughts and feeling regarding current medical situation.  Patient tells me she understands the seriousness of her situation and limited treatment options however she states "I'm not ready for hospice", "I will work it out at home"  Patient refuses hospice services at this time.  Spoke to son again today and he voices his frustration and concerns for his mother's future care.  He understands the seriousness of the current medical situation and the anticipatory care needs.    He again worries about his mother's " poor judgement  She currently is not answeinr the phone to his or his father's calls. He reports that the home is not in the best condition for someone who is as sick as she is but she will insists on returning home.    Patient has capacity to make her own decisions even if others do not agree with those decisions.  Husband agrees to  transition home when stable, "he will do what he has to do" Recommend OP palliative care services   Questions and concerns addressed   Discussed with Dr Marylyn Ishihara  Total time spent on the unit was 35 minutes  Greater than 50% of the time was spent in counseling and coordination of care  Wadie Lessen NP  Palliative Medicine Team Team Phone # [phone] Pager [phone]

## 2018-08-01 NOTE — Progress Notes (Signed)
[name]  PROGRESS NOTE    Deborah Mcintyre  WUJ:[id number] DOB: 1955/12/23 DOA: 07/27/2018 PCP: [name], MD   Brief Narrative:   Deborah Beckum Robertsis a 63 y.o.femalewith medical history significant ofanxiety, depression, osteoarthritis, bronchitis, COPD, hypertension, nocturia, bilateral tingling and numbness of toes, history of healthcare associated pneumonia at discharged9 days ago, history of breast cancer who had a chest tube placement due to a left-sided malignant pleural effusion and is coming to the emergency department due to his lack of drainage associated with 3 days of progressively worse dyspnea and intermittent chest pain. EMS reported that the patient has sinus tachycardia but went momentarily into A. fib prior to arrival. Patient denies fever, chills, but feels fatigued, decreased appetite and sleep. She denies rhinorrhea, sore throat, or hemoptysis. She has occasional wheezing. Occasional postural dizziness, no palpitations, diaphoresis, PND, orthopnea or pitting edema of the lower extremities. Her chest pain is pleuritic. Denies abdominal pain, nausea, emesis, diarrhea, constipation, melena or hematochezia. No dysuria, frequency or hematuria. No polyuria, polydipsia, polyphagia or blurred vision.   Assessment & Plan:   Principal Problem:   Pulmonary emboli (HCC) Active Problems:   Antineoplastic chemotherapy induced pancytopenia (HCC)   Hypertension   COPD (chronic obstructive pulmonary disease) (HCC)   Anxiety and depression   DNR (do not resuscitate) discussion   Tobacco abuse   Chest pain   Pleural effusion on left   Hypokalemia   Malnutrition of moderate degree   Metastatic breast cancer ([place])   Palliative care by specialist   Pulmonary embolus - CTA chest as below - Continue supplemental oxygen. - eliquis - LE dopplers ordered; negative b/l  RLL PNA - as seen on CTA PE - she immune compromised from chemo -  hypotensive ON - start lvq for HCAP, monitor - continue lvq for 7 total days abx tx; transition to PO lvq in AM  Chest tube dysfunction - spoke with PCCM about CT drainage problems; recommending discontinuing CT; IR has been consulted - chest tube now removed  Antineoplastic chemotherapy induced pancytopenia (HCC) - Her platelets had normalized, but continues to be anemic/neutropenic. - Monitor CBC. - Follow-up with oncology as scheduled.  Hypertension - amlodipine, metoprolol - hold BP meds given ON hypotension, monitor  COPD - Continue supplemental oxygen. - Bronchodilators as needed.  Anxiety and depression - Continue paroxetine  Tobacco abuse - Nicotine replacement therapy as needed.  Chest pain - Analgesics as needed.  Hypokalemia - replete, monitor  Normocytic anemia     - no frank bleed noted     - slowly trending down on eliquis     - feel weaker today and somewhat short of breath     - will transfuse 1u pRBCs  Says pain isn't controlled at this time, but is only taking oxy 2 out of the available 6 times a day. Will not escalate at this time.  DVT prophylaxis:eliquis Code Status:FULL Disposition Plan:TBD   Consultants:  PCCM  Palliative Care  Procedures:  CT removal  Antimicrobials:  Levaquin  Subjective: "I don't know how to do this."  Objective: Vitals:   07/31/18 1357 07/31/18 2139 08/01/18 0631 08/01/18 0853  BP: (!) 136/100 118/74 (!) 148/90 95/67  Pulse: (!) 108 (!) 109 (!) 113 (!) 116  Resp: 20 20 18 18   Temp: 98.7 F (37.1 C) 98.5 F (36.9 C) 99.1 F (37.3 C) 98.3 F (36.8 C)  TempSrc: Oral Oral Oral Oral  SpO2: 100% 97% 95% 100%  Weight:  Height:        Intake/Output Summary (Last 24 hours) at 08/01/2018 0956 Last data filed at 08/01/2018 0600 Gross per 24 hour  Intake 2313.87 ml  Output -  Net 2313.87 ml   Filed Weights   07/27/18  1459  Weight: 72.6 kg    Examination:  General:63 y.o.femaleresting in bed in NAD Cardiovascular: RRR, +S1, S2, no m/g/r, equal pulses throughout Respiratory:decreased in right/left apex, crackles across bases; right chest port noted GI: BS+, NDNT, no masses noted, no organomegaly noted MSK: No e/c/c Skin: No rashes, bruises, ulcerations noted Neuro: A&O x 3, no focal deficits Psyc: Appropriate interaction and affect, calm/cooperative    Data Reviewed: I have personally reviewed following labs and imaging studies.  CBC: Recent Labs  Lab 07/28/18 0415 07/29/18 0307 07/30/18 0500 07/31/18 0417 08/01/18 0449  WBC 2.1* 1.2* 1.3* 1.6* 1.8*  NEUTROABS 0.7* 0.5* 0.2* 0.2* 0.6*  HGB 8.0* 8.1* 7.2* 7.5* 7.0*  HCT 28.2* 28.3* 25.4* 27.3* 25.7*  MCV 95.6 95.9 94.8 95.5 95.9  PLT 336 363 360 425* 209   Basic Metabolic Panel: Recent Labs  Lab 07/27/18 1600 07/27/18 1602 07/28/18 0415 07/29/18 0307 07/31/18 0500 07/31/18 0502 08/01/18 0449  NA  --  143 141 139 140  --  140  K  --  3.0* 4.2 4.0 3.8  --  4.1  CL  --  100 105 101 104  --  99  CO2  --  30 27 26 27   --  32  GLUCOSE  --  94 101* 116* 94  --  105*  BUN  --  6* <5* 5* 8  --  8  CREATININE  --  0.54 0.42* 0.58 0.52  --  0.60  CALCIUM  --  8.6* 7.9* 8.2* 8.1*  --  8.3*  MG 1.9  --   --   --   --  1.7 1.8  PHOS  --   --   --   --  3.4  --  3.2   GFR: Estimated Creatinine Clearance: 71.4 mL/min (by C-G formula based on SCr of 0.6 mg/dL). Liver Function Tests: Recent Labs  Lab 07/27/18 1602 07/28/18 0415 07/29/18 0307 07/31/18 0500 08/01/18 0449  AST 13* 13* 9*  --   --   ALT 7 7 7   --   --   ALKPHOS 56 49 49  --   --   BILITOT 0.5 0.3 0.6  --   --   PROT 6.2* 6.1* 5.7*  --   --   ALBUMIN 2.6* 2.5* 2.3* 2.2* 2.3*   No results for input(s): LIPASE, AMYLASE in the last 168 hours. No results for input(s): AMMONIA in the last 168 hours. Coagulation Profile: No results for input(s): INR, PROTIME in the  last 168 hours. Cardiac Enzymes: No results for input(s): CKTOTAL, CKMB, CKMBINDEX, TROPONINI in the last 168 hours. BNP (last 3 results) No results for input(s): PROBNP in the last 8760 hours. HbA1C: No results for input(s): HGBA1C in the last 72 hours. CBG: No results for input(s): GLUCAP in the last 168 hours. Lipid Profile: No results for input(s): CHOL, HDL, LDLCALC, TRIG, CHOLHDL, LDLDIRECT in the last 72 hours. Thyroid Function Tests: No results for input(s): TSH, T4TOTAL, FREET4, T3FREE, THYROIDAB in the last 72 hours. Anemia Panel: No results for input(s): VITAMINB12, FOLATE, FERRITIN, TIBC, IRON, RETICCTPCT in the last 72 hours. Sepsis Labs: Recent Labs  Lab 07/27/18 1602 07/27/18 1750  LATICACIDVEN 1.2 0.9    Recent  Results (from the past 240 hour(s))  Culture, blood (routine x 2)     Status: None   Collection Time: 07/27/18  3:37 PM   Specimen: BLOOD  Result Value Ref Range Status   Specimen Description   Final    BLOOD RIGHT ANTECUBITAL Performed at [facility] [address], [place], [place] [address]    Special Requests   Final    BOTTLES DRAWN AEROBIC AND ANAEROBIC Blood Culture results may not be optimal due to an excessive volume of blood received in culture bottles Performed at [facility] [address] [place], [place] [address]    Culture   Final    NO GROWTH 5 DAYS Performed at [facility], [place] [address], [place], [place] [address]    Report Status 08/01/2018 FINAL  Final  Culture, blood (routine x 2)     Status: None   Collection Time: 07/27/18  4:02 PM   Specimen: BLOOD  Result Value Ref Range Status   Specimen Description   Final    BLOOD PORTA CATH Performed at [facility], [place] [address], [place], [place] [address]    Special Requests   Final    BOTTLES DRAWN AEROBIC AND ANAEROBIC Blood Culture adequate volume Performed at [facility] [address],  [place], [place] [address]    Culture   Final    NO GROWTH 5 DAYS Performed at [facility], [place] [address] [place], [place] [address]    Report Status 08/01/2018 FINAL  Final  SARS Coronavirus 2 (CEPHEID - Performed in [facility] hospital lab), Hosp Order     Status: None   Collection Time: 07/27/18  8:55 PM   Specimen: Nasopharyngeal Swab  Result Value Ref Range Status   SARS Coronavirus 2 NEGATIVE NEGATIVE Final    Comment: (NOTE) If result is NEGATIVE SARS-CoV-2 target nucleic acids are NOT DETECTED. The SARS-CoV-2 RNA is generally detectable in upper and lower  respiratory specimens during the acute phase of infection. The lowest  concentration of SARS-CoV-2 viral copies this assay can detect is 250  copies / mL. A negative result does not preclude SARS-CoV-2 infection  and should not be used as the sole basis for treatment or other  patient management decisions.  A negative result may occur with  improper specimen collection / handling, submission of specimen other  than nasopharyngeal swab, presence of viral mutation(s) within the  areas targeted by this assay, and inadequate number of viral copies  (<250 copies / mL). A negative result must be combined with clinical  observations, patient history, and epidemiological information. If result is POSITIVE SARS-CoV-2 target nucleic acids are DETECTED. The SARS-CoV-2 RNA is generally detectable in upper and lower  respiratory specimens dur ing the acute phase of infection.  Positive  results are indicative of active infection with SARS-CoV-2.  Clinical  correlation with patient history and other diagnostic information is  necessary to determine patient infection status.  Positive results do  not rule out bacterial infection or co-infection with other viruses. If result is PRESUMPTIVE POSTIVE SARS-CoV-2 nucleic acids MAY BE PRESENT.   A presumptive positive result was obtained on the submitted specimen  and confirmed on  repeat testing.  While 2019 novel coronavirus  (SARS-CoV-2) nucleic acids may be present in the submitted sample  additional confirmatory testing may be necessary for epidemiological  and / or clinical management purposes  to differentiate between  SARS-CoV-2 and  other Sarbecovirus currently known to infect humans.  If clinically indicated additional testing with an alternate test  methodology [phone]) is advised. The SARS-CoV-2 RNA is generally  detectable in upper and lower respiratory sp ecimens during the acute  phase of infection. The expected result is Negative. Fact Sheet for Patients:  StrictlyIdeas.no Fact Sheet for Healthcare Providers: BankingDealers.co.za This test is not yet approved or cleared by the [place] FDA and has been authorized for detection and/or diagnosis of SARS-CoV-2 by FDA under an Emergency Use Authorization (EUA).  This EUA will remain in effect (meaning this test can be used) for the duration of the COVID-19 declaration under Section 564(b)(1) of the Act, 21 U.S.C. section 360bbb-3(b)(1), unless the authorization is terminated or revoked sooner. Performed at [facility], [place] [address], [place], [place] [address]   MRSA PCR Screening     Status: None   Collection Time: 07/27/18 10:22 PM   Specimen: Nasal Mucosa; Nasopharyngeal  Result Value Ref Range Status   MRSA by PCR NEGATIVE NEGATIVE Final    Comment:        The GeneXpert MRSA Assay (FDA approved for NASAL specimens only), is one component of a comprehensive MRSA colonization surveillance program. It is not intended to diagnose MRSA infection nor to guide or monitor treatment for MRSA infections. Performed at [facility], [place] [address] [place],  [address]          Radiology Studies: No results found.      Scheduled Meds: . sodium chloride   Intravenous Once  . apixaban  10 mg  Oral BID   Followed by  . [START ON 08/03/2018] apixaban  5 mg Oral BID  . Chlorhexidine Gluconate Cloth  6 each Topical Daily  . dronabinol  2.5 mg Oral BID AC  . feeding supplement (ENSURE ENLIVE)  237 mL Oral BID BM  . feeding supplement (PRO-STAT SUGAR FREE 64)  30 mL Oral BID  . gabapentin  400 mg Oral TID  . levofloxacin  750 mg Oral Daily  . mouth rinse  15 mL Mouth Rinse BID  . multivitamin with minerals  1 tablet Oral Daily  . nicotine  21 mg Transdermal Q24H  . PARoxetine  20 mg Oral BID  . potassium chloride  20 mEq Oral Daily  . sodium chloride flush  10-40 mL Intracatheter Q12H   Continuous Infusions: . sodium chloride 75 mL/hr at 08/01/18 0750     LOS: 5 days    Time spent: 25 minutes spent in the coordination of care today.    Jonnie Finner, DO [facility] Pager [phone]  If 7PM-7AM, please contact night-coverage [email or web address] Password [facility] 08/01/2018, 9:56 AM

## 2018-08-01 NOTE — Progress Notes (Signed)
   08/01/18 0958  MEWS Assessment  Is this an acute change? Yes  MEWS guidelines implemented *See Row Information* Yellow  Provider Notification  Provider Name/Title Sylvan Cheese  Date Provider Notified 08/01/18  Time Provider Notified 0915  Notification Type Page  Notification Reason Change in status  Response See new orders  Date of Provider Response 08/01/18  Time of Provider Response 0958    Vital Signs MEWS/VS Documentation       07/31/2018 2139 08/01/2018 0631 08/01/2018 0714 08/01/2018 0853   MEWS Score:  1  2  2  3    MEWS Score Color:  Green  Yellow  Yellow  Yellow   Resp:  20  18  --  18   Pulse:  (!) 109  (!) 113  --  (!) 116   BP:  118/74  (!) 148/90  --  95/67   Temp:  98.5 F (36.9 C)  99.1 F (37.3 C)  --  98.3 F (36.8 C)   O2 Device:  Nasal Cannula  Nasal Cannula  --  Nasal Cannula   O2 Flow Rate (L/min):  2 L/min  2 L/min  --  2 L/min            Jeanette Caprice A Freddi Schrager 08/01/2018,9:59 AM

## 2018-08-01 NOTE — Care Management Important Message (Signed)
Important Message  Patient Details IM Letter given to Nancy Marus RN to present to the Patient Name: Deborah Mcintyre MRN: [id number] Date of Birth: 1955-12-03   Medicare Important Message Given:  Yes     Kerin Salen 08/01/2018, 11:17 AM

## 2018-08-02 LAB — TYPE AND SCREEN
ABO/RH(D): A POS
Antibody Screen: NEGATIVE
Unit division: 0

## 2018-08-02 LAB — CBC WITH DIFFERENTIAL/PLATELET
Abs Immature Granulocytes: 0.03 10*3/uL (ref 0.00–0.07)
Basophils Absolute: 0 10*3/uL (ref 0.0–0.1)
Basophils Relative: 1 %
Eosinophils Absolute: 0 10*3/uL (ref 0.0–0.5)
Eosinophils Relative: 1 %
HCT: 29.7 % — ABNORMAL LOW (ref 36.0–46.0)
Hemoglobin: 8.5 g/dL — ABNORMAL LOW (ref 12.0–15.0)
Immature Granulocytes: 1 %
Lymphocytes Relative: 21 %
Lymphs Abs: 0.6 10*3/uL — ABNORMAL LOW (ref 0.7–4.0)
MCH: 27 pg (ref 26.0–34.0)
MCHC: 28.6 g/dL — ABNORMAL LOW (ref 30.0–36.0)
MCV: 94.3 fL (ref 80.0–100.0)
Monocytes Absolute: 1 10*3/uL (ref 0.1–1.0)
Monocytes Relative: 34 %
Neutro Abs: 1.3 10*3/uL — ABNORMAL LOW (ref 1.7–7.7)
Neutrophils Relative %: 42 %
Platelets: 381 10*3/uL (ref 150–400)
RBC: 3.15 MIL/uL — ABNORMAL LOW (ref 3.87–5.11)
RDW: 16.3 % — ABNORMAL HIGH (ref 11.5–15.5)
WBC: 3 10*3/uL — ABNORMAL LOW (ref 4.0–10.5)
nRBC: 0.7 % — ABNORMAL HIGH (ref 0.0–0.2)

## 2018-08-02 LAB — BPAM RBC
Blood Product Expiration Date: 202008192359
ISSUE DATE / TIME: 202007271645
Unit Type and Rh: 6200

## 2018-08-02 LAB — RENAL FUNCTION PANEL
Albumin: 2.4 g/dL — ABNORMAL LOW (ref 3.5–5.0)
Anion gap: 8 (ref 5–15)
BUN: 9 mg/dL (ref 8–23)
CO2: 32 mmol/L (ref 22–32)
Calcium: 8.3 mg/dL — ABNORMAL LOW (ref 8.9–10.3)
Chloride: 100 mmol/L (ref 98–111)
Creatinine, Ser: 0.55 mg/dL (ref 0.44–1.00)
GFR calc Af Amer: >60 mL/min (ref >60)
GFR calc non Af Amer: >60 mL/min (ref >60)
Glucose, Bld: 113 mg/dL — ABNORMAL HIGH (ref 70–99)
Phosphorus: 3 mg/dL (ref 2.5–4.6)
Potassium: 3.8 mmol/L (ref 3.5–5.1)
Sodium: 140 mmol/L (ref 135–145)

## 2018-08-02 LAB — MAGNESIUM: Magnesium: 1.9 mg/dL (ref 1.7–2.4)

## 2018-08-02 MED ORDER — PAROXETINE HCL 20 MG PO TABS: 20.0000 mg | ORAL_TABLET | Freq: Two times a day (BID) | ORAL | 1 refills | Status: AC

## 2018-08-02 MED ORDER — OXYCODONE HCL 5 MG PO TABS
10.0000 mg | ORAL_TABLET | Freq: Three times a day (TID) | ORAL | Status: DC | PRN
  Administered 2018-08-02 – 2018-08-03 (×2): 10 mg via ORAL
  Filled 2018-08-02 (×2): qty 2

## 2018-08-02 MED ORDER — APIXABAN 5 MG PO TABS: ORAL_TABLET | ORAL | 0 refills | Status: DC

## 2018-08-02 MED ORDER — NICOTINE 21 MG/24HR TD PT24: 21.0000 mg | MEDICATED_PATCH | TRANSDERMAL | 0 refills | Status: AC

## 2018-08-02 MED ORDER — APIXABAN 5 MG PO TABS: 5.0000 mg | ORAL_TABLET | Freq: Every day | ORAL | 1 refills | Status: DC

## 2018-08-02 MED ORDER — APIXABAN 5 MG PO TABS: 5.0000 mg | ORAL_TABLET | Freq: Two times a day (BID) | ORAL | 1 refills | Status: AC

## 2018-08-02 MED ORDER — LEVOFLOXACIN 750 MG PO TABS: 750.0000 mg | ORAL_TABLET | Freq: Every day | ORAL | 0 refills | Status: AC

## 2018-08-02 MED ORDER — OXYCODONE-ACETAMINOPHEN 10-325 MG PO TABS: 1.0000 | ORAL_TABLET | Freq: Four times a day (QID) | ORAL | 0 refills | Status: DC | PRN

## 2018-08-02 NOTE — Progress Notes (Signed)
Patient's husband called in to check on patient, given updates. He would like an update from night shift RN as well, around bedtime.

## 2018-08-02 NOTE — Evaluation (Signed)
Physical Therapy Evaluation Patient Details Name: Deborah Mcintyre MRN: [id number] DOB: [birth year] Today's Date: 08/02/2018   History of Present Illness  63 yo female admitted to ED on 7/23 for ShOB x3 days, imaging reveals RLL PE, large R pleural effusion with complete atelectasis of LLL, R lobe PNA. Palliative following, pt refuses hospice at this time. Pt with stage IV breast cancer with mets to lungs and pleura, pleurx drain 05/2018-07/28/2018 with history of chemo and lumpectomy/L mastectomy. PMH includes anxiety, OA, COPD on 2LO2 for comfort, depression, HTN, paresthesias of toes, PNA.  Clinical Impression   Pt presents with generalized weakness, lethargy and dizziness with mobility, chest pain, dyspnea on exertion with O2sats maintained 98-100% on 2LO2, and decreased activity tolerance. Pt to benefit from acute PT to address deficits. Pt able to come to long sitting with no physical assist, requires increased time and limited by dizziness. VSS, HR 107-119 bpm and BP 121/90. Per RN, pt mobilizes well to and from [facility], and pt is adamant about d/c home. PT recommending HHPT based on eval and RN comments, will reassess tomorrow before d/c. PT to progress mobility as tolerated, and will continue to follow acutely.      Follow Up Recommendations Home health PT;Supervision for mobility/OOB    Equipment Recommendations  None recommended by PT    Recommendations for Other Services       Precautions / Restrictions Precautions Precautions: Fall Restrictions Weight Bearing Restrictions: No      Mobility  Bed Mobility Overal bed mobility: Needs Assistance Bed Mobility: Supine to Sit     Supine to sit: HOB elevated;Min guard     General bed mobility comments: Min guard for safety, pt came to long sitting with increased time, reported dizziness, and immediately returned to supine. Pt refused further mobility, even with max PT encouragement.  Transfers Overall transfer level: (NT - pt  refuses)                  Ambulation/Gait                Stairs            Wheelchair Mobility    Modified Rankin (Stroke Patients Only)       Balance Overall balance assessment: (unable to assess, pt lethargic and refusing OOB)                                           Pertinent Vitals/Pain Pain Assessment: 0-10 Pain Score: 7  Pain Location: chest Pain Descriptors / Indicators: Aching Pain Intervention(s): Limited activity within patient's tolerance;Monitored during session    Home Living Family/patient expects to be discharged to:: Private residence Living Arrangements: Spouse/significant other;Children Available Help at Discharge: Family;Available 24 hours/day Type of Home: House Home Access: Stairs to enter Entrance Stairs-Rails: None Entrance Stairs-Number of Steps: 3 Home Layout: One level Home Equipment: Shower seat      Prior Function Level of Independence: Independent               Hand Dominance   Dominant Hand: Right    Extremity/Trunk Assessment   Upper Extremity Assessment Upper Extremity Assessment: Generalized weakness    Lower Extremity Assessment Lower Extremity Assessment: Generalized weakness(3/5 hip and knee flexion/extension, limited bedlevel eval)    Cervical / Trunk Assessment Cervical / Trunk Assessment: Normal  Communication   Communication: No difficulties  Cognition  Arousal/Alertness: Lethargic Behavior During Therapy: Flat affect Overall Cognitive Status: Impaired/Different from baseline Area of Impairment: Problem solving;Following commands;Attention                   Current Attention Level: Sustained   Following Commands: Follows one step commands with increased time     Problem Solving: Slow processing;Difficulty sequencing;Requires verbal cues;Requires tactile cues General Comments: Pt very lethargic, with periods of eye closing during session.      General  Comments      Exercises     Assessment/Plan    PT Assessment Patient needs continued PT services  PT Problem List Decreased strength;Decreased mobility;Decreased activity tolerance;Decreased knowledge of use of DME;Pain       PT Treatment Interventions DME instruction;Therapeutic activities;Therapeutic exercise;Gait training;Patient/family education;Balance training;Stair training;Functional mobility training    PT Goals (Current goals can be found in the Care Plan section)  Acute Rehab PT Goals PT Goal Formulation: With patient Time For Goal Achievement: 08/16/18 Potential to Achieve Goals: Good    Frequency Min 3X/week   Barriers to discharge        Co-evaluation               AM-PAC PT "6 Clicks" Mobility  Outcome Measure Help needed turning from your back to your side while in a flat bed without using bedrails?: A Little Help needed moving from lying on your back to sitting on the side of a flat bed without using bedrails?: A Little Help needed moving to and from a bed to a chair (including a wheelchair)?: A Little Help needed standing up from a chair using your arms (e.g., wheelchair or bedside chair)?: A Little Help needed to walk in hospital room?: A Little Help needed climbing 3-5 steps with a railing? : A Little 6 Click Score: 18    End of Session Equipment Utilized During Treatment: Oxygen Activity Tolerance: Patient limited by lethargy;Patient limited by pain Patient left: in bed;with bed alarm set;with nursing/sitter in room Nurse Communication: Mobility status PT Visit Diagnosis: Other abnormalities of gait and mobility (R26.89);Muscle weakness (generalized) (M62.81)    Time: 1202-1220 PT Time Calculation (min) (ACUTE ONLY): 18 min   Charges:   PT Evaluation $PT Eval Low Complexity: 1 Low        Julien Girt, PT Acute Rehabilitation Services Pager [phone]  Office [phone]  Roxine Caddy D Elonda Husky 08/02/2018, 12:37 PM

## 2018-08-02 NOTE — Progress Notes (Signed)
[name]  PROGRESS NOTE    Deborah Mcintyre  PNT:[id number] DOB: 09-Apr-1955 DOA: 07/27/2018 PCP: [name], MD   Brief Narrative:   Deborah Adolph Robertsis a 63 y.o.femalewith medical history significant ofanxiety, depression, osteoarthritis, bronchitis, COPD, hypertension, nocturia, bilateral tingling and numbness of toes, history of healthcare associated pneumonia at discharged9 days ago, history of breast cancer who had a chest tube placement due to a left-sided malignant pleural effusion and is coming to the emergency department due to his lack of drainage associated with 3 days of progressively worse dyspnea and intermittent chest pain. EMS reported that the patient has sinus tachycardia but went momentarily into A. fib prior to arrival. Patient denies fever, chills, but feels fatigued, decreased appetite and sleep. She denies rhinorrhea, sore throat, or hemoptysis. She has occasional wheezing. Occasional postural dizziness, no palpitations, diaphoresis, PND, orthopnea or pitting edema of the lower extremities. Her chest pain is pleuritic. Denies abdominal pain, nausea, emesis, diarrhea, constipation, melena or hematochezia. No dysuria, frequency or hematuria. No polyuria, polydipsia, polyphagia or blurred vision.   Assessment & Plan:   Principal Problem:   Pulmonary emboli (HCC) Active Problems:   Antineoplastic chemotherapy induced pancytopenia (HCC)   Hypertension   COPD (chronic obstructive pulmonary disease) (HCC)   Anxiety and depression   DNR (do not resuscitate) discussion   Tobacco abuse   Chest pain   Pleural effusion on left   Hypokalemia   Malnutrition of moderate degree   Metastatic breast cancer ([place])   Palliative care by specialist   Weakness generalized   Pulmonary embolus - CTA chest as below - Continue supplemental oxygen. - eliquis - LE dopplers ordered; negative b/l  RLL PNA - as seen on CTA PE - she immune compromised  from chemo - hypotensive ON - start lvq for HCAP, monitor - continue lvq for 7 total days abx tx; transition to PO lvq in AM     - lvq through 08/04/2018  Chest tube dysfunction - spoke with PCCM about CT drainage problems; recommending discontinuing CT; IR has been consulted - chest tube now removed  Antineoplastic chemotherapy induced pancytopenia (HCC) - Her platelets had normalized, but continues to be anemic/neutropenic. - Monitor CBC. - Follow-up with oncology as scheduled.  Hypertension - amlodipine, metoprolol - hold BP meds given ON hypotension, monitor     - continue holding fluids  COPD - Continue supplemental oxygen. - Bronchodilators as needed.  Anxiety and depression - Continue paroxetine  Tobacco abuse - Nicotine replacement therapy as needed.  Chest pain - Analgesics as needed.  Hypokalemia - replete, monitor  Normocytic anemia     - no frank bleed noted     - slowly trending down on eliquis     - feel weaker today and somewhat short of breath     - will transfuse 1u pRBCs  Says she is dizzy and weak working with PT. Give more fluids. Hold discharge for today. See PC note. Her prognosis for compliance is not good.  DVT prophylaxis:eliquis Code Status:FULL Disposition Plan:TBD   Consultants:  PCCM  Palliative Care  Procedures:  CT removal  Antimicrobials:  Levaquin  Subjective: "I stay in pain."  Objective: Vitals:   08/01/18 2054 08/02/18 0105 08/02/18 0419 08/02/18 1218  BP: 123/71 137/76 125/83 121/90  Pulse: (!) 109 (!) 110 (!) 110 (!) 113  Resp: 20 20 18    Temp: 99.1 F (37.3 C) 98.7 F (37.1 C) 98.4 F (36.9 C)   TempSrc: Oral Oral Oral  SpO2: 100% 97% 94% 100%  Weight:      Height:        Intake/Output Summary (Last 24 hours) at 08/02/2018 1314 Last data filed at 08/02/2018 0300 Gross per 24 hour  Intake 1800.46 ml  Output -   Net 1800.46 ml   Filed Weights   07/27/18 1459  Weight: 72.6 kg    Examination:  General:63 y.o.femaleresting in bed in NAD Cardiovascular: RRR, +S1, S2, no m/g/r, equal pulses throughout Respiratory:decreased in right/left apex, right chest port noted GI: BS+, NDNT, no masses noted, no organomegaly noted MSK: No e/c/c Skin: No rashes, bruises, ulcerations noted Neuro: A&O x 3, no focal deficits Psyc: Appropriate interaction and affect, calm/cooperative    Data Reviewed: I have personally reviewed following labs and imaging studies.  CBC: Recent Labs  Lab 07/29/18 0307 07/30/18 0500 07/31/18 0417 08/01/18 0449 08/01/18 2246 08/02/18 0523  WBC 1.2* 1.3* 1.6* 1.8*  --  3.0*  NEUTROABS 0.5* 0.2* 0.2* 0.6*  --  1.3*  HGB 8.1* 7.2* 7.5* 7.0* 8.4* 8.5*  HCT 28.3* 25.4* 27.3* 25.7* 29.0* 29.7*  MCV 95.9 94.8 95.5 95.9  --  94.3  PLT 363 360 425* 396  --  355   Basic Metabolic Panel: Recent Labs  Lab 07/27/18 1600  07/28/18 0415 07/29/18 0307 07/31/18 0500 07/31/18 0502 08/01/18 0449 08/02/18 0523  NA  --    < > 141 139 140  --  140 140  K  --    < > 4.2 4.0 3.8  --  4.1 3.8  CL  --    < > 105 101 104  --  99 100  CO2  --    < > 27 26 27   --  32 32  GLUCOSE  --    < > 101* 116* 94  --  105* 113*  BUN  --    < > <5* 5* 8  --  8 9  CREATININE  --    < > 0.42* 0.58 0.52  --  0.60 0.55  CALCIUM  --    < > 7.9* 8.2* 8.1*  --  8.3* 8.3*  MG 1.9  --   --   --   --  1.7 1.8 1.9  PHOS  --   --   --   --  3.4  --  3.2 3.0   < > = values in this interval not displayed.   GFR: Estimated Creatinine Clearance: 71.4 mL/min (by C-G formula based on SCr of 0.55 mg/dL). Liver Function Tests: Recent Labs  Lab 07/27/18 1602 07/28/18 0415 07/29/18 0307 07/31/18 0500 08/01/18 0449 08/02/18 0523  AST 13* 13* 9*  --   --   --   ALT 7 7 7   --   --   --   ALKPHOS 56 49 49  --   --   --   BILITOT 0.5 0.3 0.6  --   --   --   PROT 6.2* 6.1* 5.7*  --   --   --   ALBUMIN  2.6* 2.5* 2.3* 2.2* 2.3* 2.4*   No results for input(s): LIPASE, AMYLASE in the last 168 hours. No results for input(s): AMMONIA in the last 168 hours. Coagulation Profile: No results for input(s): INR, PROTIME in the last 168 hours. Cardiac Enzymes: No results for input(s): CKTOTAL, CKMB, CKMBINDEX, TROPONINI in the last 168 hours. BNP (last 3 results) No results for input(s): PROBNP in the last 8760 hours. HbA1C: No  results for input(s): HGBA1C in the last 72 hours. CBG: No results for input(s): GLUCAP in the last 168 hours. Lipid Profile: No results for input(s): CHOL, HDL, LDLCALC, TRIG, CHOLHDL, LDLDIRECT in the last 72 hours. Thyroid Function Tests: No results for input(s): TSH, T4TOTAL, FREET4, T3FREE, THYROIDAB in the last 72 hours. Anemia Panel: No results for input(s): VITAMINB12, FOLATE, FERRITIN, TIBC, IRON, RETICCTPCT in the last 72 hours. Sepsis Labs: Recent Labs  Lab 07/27/18 1602 07/27/18 1750  LATICACIDVEN 1.2 0.9    Recent Results (from the past 240 hour(s))  Culture, blood (routine x 2)     Status: None   Collection Time: 07/27/18  3:37 PM   Specimen: BLOOD  Result Value Ref Range Status   Specimen Description   Final    BLOOD RIGHT ANTECUBITAL Performed at [facility] [address], [place], [place] [address]    Special Requests   Final    BOTTLES DRAWN AEROBIC AND ANAEROBIC Blood Culture results may not be optimal due to an excessive volume of blood received in culture bottles Performed at [facility] [address] [place], [place] [address]    Culture   Final    NO GROWTH 5 DAYS Performed at [facility], [place] [address], [place], [place] [address]    Report Status 08/01/2018 FINAL  Final  Culture, blood (routine x 2)     Status: None   Collection Time: 07/27/18  4:02 PM   Specimen: BLOOD  Result Value Ref Range Status   Specimen Description   Final    BLOOD PORTA CATH Performed at [facility], [place] [address] [place], [place] [address]    Special Requests   Final    BOTTLES DRAWN AEROBIC AND ANAEROBIC Blood Culture adequate volume Performed at [facility] [address], [place], [place] [address]    Culture   Final    NO GROWTH 5 DAYS Performed at [facility], [place] [address], [place], [place] [address]    Report Status 08/01/2018 FINAL  Final  SARS Coronavirus 2 (CEPHEID - Performed in [facility] hospital lab), Hosp Order     Status: None   Collection Time: 07/27/18  8:55 PM   Specimen: Nasopharyngeal Swab  Result Value Ref Range Status   SARS Coronavirus 2 NEGATIVE NEGATIVE Final    Comment: (NOTE) If result is NEGATIVE SARS-CoV-2 target nucleic acids are NOT DETECTED. The SARS-CoV-2 RNA is generally detectable in upper and lower  respiratory specimens during the acute phase of infection. The lowest  concentration of SARS-CoV-2 viral copies this assay can detect is 250  copies / mL. A negative result does not preclude SARS-CoV-2 infection  and should not be used as the sole basis for treatment or other  patient management decisions.  A negative result may occur with  improper specimen collection / handling, submission of specimen other  than nasopharyngeal swab, presence of viral mutation(s) within the  areas targeted by this assay, and inadequate number of viral copies  (<250 copies / mL). A negative result must be combined with clinical  observations, patient history, and epidemiological information. If result is POSITIVE SARS-CoV-2 target nucleic acids are DETECTED. The SARS-CoV-2 RNA is generally detectable in upper and lower  respiratory specimens dur ing the acute phase of infection.  Positive  results are indicative of active infection with SARS-CoV-2.  Clinical  correlation with patient history and other diagnostic information is  necessary  to determine patient infection status.  Positive results do  not rule out  bacterial infection or co-infection with other viruses. If result is PRESUMPTIVE POSTIVE SARS-CoV-2 nucleic acids MAY BE PRESENT.   A presumptive positive result was obtained on the submitted specimen  and confirmed on repeat testing.  While 2019 novel coronavirus  (SARS-CoV-2) nucleic acids may be present in the submitted sample  additional confirmatory testing may be necessary for epidemiological  and / or clinical management purposes  to differentiate between  SARS-CoV-2 and other Sarbecovirus currently known to infect humans.  If clinically indicated additional testing with an alternate test  methodology [id number]) is advised. The SARS-CoV-2 RNA is generally  detectable in upper and lower respiratory sp ecimens during the acute  phase of infection. The expected result is Negative. Fact Sheet for Patients:  StrictlyIdeas.no Fact Sheet for Healthcare Providers: BankingDealers.co.za This test is not yet approved or cleared by the [place] FDA and has been authorized for detection and/or diagnosis of SARS-CoV-2 by FDA under an Emergency Use Authorization (EUA).  This EUA will remain in effect (meaning this test can be used) for the duration of the COVID-19 declaration under Section 564(b)(1) of the Act, 21 U.S.C. section 360bbb-3(b)(1), unless the authorization is terminated or revoked sooner. Performed at [facility], [place] [address], [place], [place] [address]   MRSA PCR Screening     Status: None   Collection Time: 07/27/18 10:22 PM   Specimen: Nasal Mucosa; Nasopharyngeal  Result Value Ref Range Status   MRSA by PCR NEGATIVE NEGATIVE Final    Comment:        The GeneXpert MRSA Assay (FDA approved for NASAL specimens only), is one component of a comprehensive MRSA colonization surveillance program. It is not intended to diagnose MRSA infection nor to guide or monitor treatment for MRSA infections.  Performed at [facility], [place] [address] [place], [place] [address]          Radiology Studies: No results found.      Scheduled Meds: . apixaban  10 mg Oral BID   Followed by  . [START ON 08/03/2018] apixaban  5 mg Oral BID  . dronabinol  2.5 mg Oral BID AC  . feeding supplement (ENSURE ENLIVE)  237 mL Oral BID BM  . feeding supplement (PRO-STAT SUGAR FREE 64)  30 mL Oral BID  . gabapentin  400 mg Oral TID  . levofloxacin  750 mg Oral Daily  . mouth rinse  15 mL Mouth Rinse BID  . multivitamin with minerals  1 tablet Oral Daily  . nicotine  21 mg Transdermal Q24H  . PARoxetine  20 mg Oral BID  . potassium chloride  20 mEq Oral Daily  . sodium chloride flush  10-40 mL Intracatheter Q12H   Continuous Infusions: . sodium chloride 75 mL/hr at 08/02/18 1250     LOS: 6 days    Time spent: 25 minutes spent in the coordination of care today.    Jonnie Finner, DO [facility] Pager [phone]  If 7PM-7AM, please contact night-coverage [email or web address] Password [email or web address] 08/02/2018, 1:14 PM

## 2018-08-03 DIAGNOSIS — R079 Chest pain, unspecified: Secondary | ICD-10-CM

## 2018-08-03 DIAGNOSIS — F419 Anxiety disorder, unspecified: Secondary | ICD-10-CM

## 2018-08-03 DIAGNOSIS — F329 Major depressive disorder, single episode, unspecified: Secondary | ICD-10-CM

## 2018-08-03 DIAGNOSIS — G893 Neoplasm related pain (acute) (chronic): Secondary | ICD-10-CM

## 2018-08-03 LAB — CBC WITH DIFFERENTIAL/PLATELET
Abs Immature Granulocytes: 0.04 10*3/uL (ref 0.00–0.07)
Basophils Absolute: 0 10*3/uL (ref 0.0–0.1)
Basophils Relative: 0 %
Eosinophils Absolute: 0 10*3/uL (ref 0.0–0.5)
Eosinophils Relative: 1 %
HCT: 28.9 % — ABNORMAL LOW (ref 36.0–46.0)
Hemoglobin: 8.3 g/dL — ABNORMAL LOW (ref 12.0–15.0)
Immature Granulocytes: 1 %
Lymphocytes Relative: 15 %
Lymphs Abs: 0.7 10*3/uL (ref 0.7–4.0)
MCH: 27 pg (ref 26.0–34.0)
MCHC: 28.7 g/dL — ABNORMAL LOW (ref 30.0–36.0)
MCV: 94.1 fL (ref 80.0–100.0)
Monocytes Absolute: 1.3 10*3/uL — ABNORMAL HIGH (ref 0.1–1.0)
Monocytes Relative: 27 %
Neutro Abs: 2.7 10*3/uL (ref 1.7–7.7)
Neutrophils Relative %: 56 %
Platelets: 366 10*3/uL (ref 150–400)
RBC: 3.07 MIL/uL — ABNORMAL LOW (ref 3.87–5.11)
RDW: 16.4 % — ABNORMAL HIGH (ref 11.5–15.5)
WBC: 4.8 10*3/uL (ref 4.0–10.5)
nRBC: 0 % (ref 0.0–0.2)

## 2018-08-03 MED ORDER — LABETALOL HCL 5 MG/ML IV SOLN: 5.0000 mg | INTRAVENOUS | Status: DC | PRN

## 2018-08-03 MED ORDER — OXYCODONE HCL 5 MG PO TABS
10.0000 mg | ORAL_TABLET | ORAL | Status: DC | PRN
  Administered 2018-08-03 – 2018-08-04 (×5): 10 mg via ORAL
  Filled 2018-08-03 (×5): qty 2

## 2018-08-03 MED ORDER — METOPROLOL TARTRATE 25 MG PO TABS
12.5000 mg | ORAL_TABLET | Freq: Two times a day (BID) | ORAL | Status: DC
  Administered 2018-08-03 – 2018-08-04 (×3): 12.5 mg via ORAL
  Filled 2018-08-03 (×3): qty 1

## 2018-08-03 MED ORDER — LABETALOL HCL 5 MG/ML IV SOLN
5.0000 mg | INTRAVENOUS | Status: DC | PRN
  Filled 2018-08-03: qty 4

## 2018-08-03 NOTE — Progress Notes (Signed)
Patient ID: Deborah Mcintyre, female   DOB: [birth year], 63 y.o.   MRN: [id number]  This NP visited patient at the bedside as a follow up for palliative medicine needs and emotional support.  Patient is weak with decreased po intake.  Sat at bedside with patient.  Created space and opportunity for patient to explore her thoughts and feeling regarding current medical situation. We discussed the seriousness of her current medical situation  and limited treatment options.  We discussed the importance of consideration and documentation of advanced care planning ( ie code status and desire for rehospitalization) in overall treatment plan specifically as it relates to pain management strategies.  Patient anticipates discharge home today or tomorrow. Patient refuses hospice services at this time.  Spoke to husband, he understands the seriousness of the current medical situation and the anticipatory care needs.    He like his son is sometimes frustrated to patient's decisions.  He tells me she will not answer the phone to "talk about things".  Patient has capacity to make her own decisions even if others do not agree with those decisions.  Husband agrees to  transition home when stable.  Recommend OP palliative care services  Questions and concerns addressed   Discussed with Dr Wyline Copas  Total time spent on the unit was 40 minutes  Greater than 50% of the time was spent in counseling and coordination of care  Wadie Lessen NP  Palliative Medicine Team Team Phone # [phone] Pager [phone]

## 2018-08-03 NOTE — TOC Progression Note (Signed)
Transition of Care [facility]) - Progression Note    Patient Details  Name: Deborah Mcintyre MRN: [id number] Date of Birth: 01/15/1955  Transition of Care [facility]) CM/SW Contact  Servando Snare, [place] Phone Number: 08/03/2018, 9:56 AM  Clinical Narrative:   Patient is active with [facility] and will need resumption orders at dc.          Expected Discharge Plan and Services                                                 Social Determinants of Health (SDOH) Interventions    Readmission Risk Interventions Readmission Risk Prevention Plan 07/28/2018 07/15/2018  Transportation Screening Complete Complete  Medication Review Press photographer) Not Complete Complete  Med Review Comments 3 medications needing review per RN admission summary -  PCP or Specialist appointment within 3-5 days of discharge Not Complete Complete  PCP/Specialist Appt Not Complete comments DC date unknown -  HRI or [place] Complete Complete  SW Recovery Care/Counseling Consult Complete Complete  Palliative Care Screening Not Applicable Not [place] Not Applicable Not Applicable  Some recent data might be hidden

## 2018-08-03 NOTE — Progress Notes (Signed)
Nutrition Follow-up  RD working remotely.   DOCUMENTATION CODES:   Non-severe (moderate) malnutrition in context of chronic illness  INTERVENTION:  - continue Ensure Enlive BID and prostat BID. - continue to encourage PO intakes.  - weigh patient today.    NUTRITION DIAGNOSIS:   Moderate Malnutrition related to chronic illness, cancer and cancer related treatments as evidenced by mild fat depletion, mild muscle depletion. -ongoing  GOAL:   Patient will meet greater than or equal to 90% of their needs -unmet on average  MONITOR:   PO intake, Supplement acceptance, Labs, Weight trends, I & O's  ASSESSMENT:   63 y.o. female with medical history significant of anxiety, depression, osteoarthritis, bronchitis, COPD, HTN, nocturia, bilateral tingling and numbness of toes, metastatic breast cancer with mets to lung and pleural space. She has had several hospitalizations over the past 2 months and was discharged 9 days PTA following admission for HCAP. She had chest tube placed d/t to L-sided malignant pleural effusion and presented to the ED d/t lack of drainage from tube with associated 3 day history of dyspnea and intermittent chest pain. She reported fatigue, decreased appetite, and decreased sleep.  Patient has not been weighed since admission (7/22). Patient seen in person by this RD on 7/23; did not enter patient's room today to decrease contacts/exposure. Per review of orders, she has been accepting Ensure 50% of the time offered and prostat 80% of the time offered. She is receiving marinol BID; will continue to monitor for effectiveness and if recommendation for increase should be made. Per RN flow sheet, she consumed 100% of lunch on 7/26 (636 kcal, 31 grams protein) and 50% of breakfast on 7/27 (398 kcal, 11 grams protein).   Per notes: - RLL PNA - chest tube dysfunction s/p removal - chemo-induced pancytopenia - normocytic anemia - dizzy and weak feeling when working with PT  on 7/28    Labs reviewed; Ca: 8.3 mg/dl. Medications reviewed; 2.5 mg marinol BID, daily multivitamin with minerals, 20 mEq K-Dur/day. IVF; NS @ 100 ml/hr.    Diet Order:   Diet Order            Diet regular Room service appropriate? Yes; Fluid consistency: Thin  Diet effective now              EDUCATION NEEDS:   Education needs have been addressed  Skin:  Skin Assessment: Reviewed RN Assessment  Last BM:  7/29  Height:   Ht Readings from Last 1 Encounters:  07/27/18 5' 7.5" (1.715 m)    Weight:   Wt Readings from Last 1 Encounters:  07/27/18 72.6 kg    Ideal Body Weight:  62.5 kg  BMI:  Body mass index is 24.69 kg/m.  Estimated Nutritional Needs:   Kcal:  2325-2540 kcal  Protein:  115-130 grams  Fluid:  >/= 1.5 L/day     Jarome Matin, MS, RD, LDN, [facility] Inpatient Clinical Dietitian Pager # [phone] After hours/weekend pager # [phone]

## 2018-08-03 NOTE — Progress Notes (Signed)
PROGRESS NOTE    JAZZ BIDDY  IOE:[id number] DOB: [birth year] DOA: 07/27/2018 PCP: [name], MD    Brief Narrative:  63 y.o.femalewith medical history significant ofanxiety, depression, osteoarthritis, bronchitis, COPD, hypertension, nocturia, bilateral tingling and numbness of toes, history of healthcare associated pneumonia at discharged9 days ago, history of breast cancer who had a chest tube placement due to a left-sided malignant pleural effusion and is coming to the emergency department due to his lack of drainage associated with 3 days of progressively worse dyspnea and intermittent chest pain. EMS reported that the patient has sinus tachycardia but went momentarily into A. fib prior to arrival. Patient denies fever, chills, but feels fatigued, decreased appetite and sleep. She denies rhinorrhea, sore throat, or hemoptysis. She has occasional wheezing. Occasional postural dizziness, no palpitations, diaphoresis, PND, orthopnea or pitting edema of the lower extremities. Her chest pain is pleuritic. Denies abdominal pain, nausea, emesis, diarrhea, constipation, melena or hematochezia. No dysuria, frequency or hematuria. No polyuria, polydipsia, polyphagia or blurred vision.   Assessment & Plan:   Principal Problem:   Pulmonary emboli (HCC) Active Problems:   Antineoplastic chemotherapy induced pancytopenia (HCC)   Hypertension   COPD (chronic obstructive pulmonary disease) (HCC)   Anxiety and depression   DNR (do not resuscitate) discussion   Tobacco abuse   Chest pain   Pleural effusion on left   Hypokalemia   Malnutrition of moderate degree   Metastatic breast cancer ([place])   Palliative care by specialist   Weakness generalized   Cancer associated pain  Pulmonary embolus - CTA chest as below - Continue supplemental oxygen. - eliquis - LE dopplers ordered; negative b/l  RLL PNA - as seen on CTA PE - she immune compromised  from chemo - hypotensive ON - start lvq for HCAP, monitor - continue lvq for 7 total days abx tx; transition to PO lvq in AM     - planned lvq through 08/04/2018  Chest tube dysfunction - spoke with PCCM about CT drainage problems; recommending discontinuing CT; IR has been consulted - chest tube since removed  Antineoplastic chemotherapy induced pancytopenia (HCC) - Her platelets had normalized, but continues to be anemic/neutropenic. - Monitor CBC. - Follow-up with oncology as scheduled.  Hypertension - amlodipine, metoprolol - hold BP meds given ON hypotension, monitor     - continue holding fluids  COPD - Continue supplemental oxygen. - Bronchodilators as needed.  Anxiety and depression - Continue paroxetine  Tobacco abuse - Nicotine replacement therapy as needed.  Chest pain - Analgesics as needed.  Hypokalemia - replete, monitor  Normocytic anemia - no frank bleed noted  DVT prophylaxis: eliquis Code Status: Full Family Communication: Pt in room, family not at bedside Disposition Plan: Uncertain this time  Consultants:   PCCM  Palliative Care  Procedures:   CT removal  Antimicrobials: Anti-infectives (From admission, onward)   Start     Dose/Rate Route Frequency Ordered Stop   08/02/18 0000  levofloxacin (LEVAQUIN) 750 MG tablet     750 mg Oral Daily 08/02/18 0851 08/04/18 2359   08/01/18 1000  levofloxacin (LEVAQUIN) tablet 750 mg     750 mg Oral Daily 07/31/18 0859 08/05/18 0959   07/29/18 0800  levofloxacin (LEVAQUIN) IVPB 750 mg     750 mg 100 mL/hr over 90 Minutes Intravenous Every 24 hours 07/29/18 0717 07/31/18 1146   07/29/18 0730  levofloxacin (LEVAQUIN) tablet 750 mg  Status:  Discontinued     750 mg Oral Daily 07/29/18 0712 07/29/18  6962       Subjective: Reports feeling better  Objective: Vitals:   08/02/18 1335 08/02/18 2147 08/03/18 0930 08/03/18  1359  BP: 124/83 108/78  (!) 147/87  Pulse: (!) 112 (!) 115  (!) 116  Resp: 20 18  14   Temp: 99.1 F (37.3 C) 98.2 F (36.8 C)  99.1 F (37.3 C)  TempSrc: Oral Oral  Oral  SpO2: 97% 92%  100%  Weight:   74.1 kg   Height:        Intake/Output Summary (Last 24 hours) at 08/03/2018 1832 Last data filed at 08/03/2018 0900 Gross per 24 hour  Intake 1139.89 ml  Output -  Net 1139.89 ml   Filed Weights   07/27/18 1459 08/03/18 0930  Weight: 72.6 kg 74.1 kg    Examination:  General exam: Appears calm and comfortable  Respiratory system: Clear to auscultation. Respiratory effort normal. Cardiovascular system: S1 & S2 heard, RRR. Gastrointestinal system: Abdomen is nondistended, soft and nontender. No organomegaly or masses felt. Normal bowel sounds heard. Central nervous system: Alert and oriented. No focal neurological deficits. Extremities: Symmetric 5 x 5 power. Skin: No rashes, lesions  Psychiatry: Judgement and insight appear normal. Mood & affect appropriate.   Data Reviewed: I have personally reviewed following labs and imaging studies  CBC: Recent Labs  Lab 07/30/18 0500 07/31/18 0417 08/01/18 0449 08/01/18 2246 08/02/18 0523 08/03/18 0348  WBC 1.3* 1.6* 1.8*  --  3.0* 4.8  NEUTROABS 0.2* 0.2* 0.6*  --  1.3* 2.7  HGB 7.2* 7.5* 7.0* 8.4* 8.5* 8.3*  HCT 25.4* 27.3* 25.7* 29.0* 29.7* 28.9*  MCV 94.8 95.5 95.9  --  94.3 94.1  PLT 360 425* 396  --  381 952   Basic Metabolic Panel: Recent Labs  Lab 07/28/18 0415 07/29/18 0307 07/31/18 0500 07/31/18 0502 08/01/18 0449 08/02/18 0523  NA 141 139 140  --  140 140  K 4.2 4.0 3.8  --  4.1 3.8  CL 105 101 104  --  99 100  CO2 27 26 27   --  32 32  GLUCOSE 101* 116* 94  --  105* 113*  BUN <5* 5* 8  --  8 9  CREATININE 0.42* 0.58 0.52  --  0.60 0.55  CALCIUM 7.9* 8.2* 8.1*  --  8.3* 8.3*  MG  --   --   --  1.7 1.8 1.9  PHOS  --   --  3.4  --  3.2 3.0   GFR: Estimated Creatinine Clearance: 71.4 mL/min (by C-G  formula based on SCr of 0.55 mg/dL). Liver Function Tests: Recent Labs  Lab 07/28/18 0415 07/29/18 0307 07/31/18 0500 08/01/18 0449 08/02/18 0523  AST 13* 9*  --   --   --   ALT 7 7  --   --   --   ALKPHOS 49 49  --   --   --   BILITOT 0.3 0.6  --   --   --   PROT 6.1* 5.7*  --   --   --   ALBUMIN 2.5* 2.3* 2.2* 2.3* 2.4*   No results for input(s): LIPASE, AMYLASE in the last 168 hours. No results for input(s): AMMONIA in the last 168 hours. Coagulation Profile: No results for input(s): INR, PROTIME in the last 168 hours. Cardiac Enzymes: No results for input(s): CKTOTAL, CKMB, CKMBINDEX, TROPONINI in the last 168 hours. BNP (last 3 results) No results for input(s): PROBNP in the last 8760 hours. HbA1C: No  results for input(s): HGBA1C in the last 72 hours. CBG: No results for input(s): GLUCAP in the last 168 hours. Lipid Profile: No results for input(s): CHOL, HDL, LDLCALC, TRIG, CHOLHDL, LDLDIRECT in the last 72 hours. Thyroid Function Tests: No results for input(s): TSH, T4TOTAL, FREET4, T3FREE, THYROIDAB in the last 72 hours. Anemia Panel: No results for input(s): VITAMINB12, FOLATE, FERRITIN, TIBC, IRON, RETICCTPCT in the last 72 hours. Sepsis Labs: No results for input(s): PROCALCITON, LATICACIDVEN in the last 168 hours.  Recent Results (from the past 240 hour(s))  Culture, blood (routine x 2)     Status: None   Collection Time: 07/27/18  3:37 PM   Specimen: BLOOD  Result Value Ref Range Status   Specimen Description   Final    BLOOD RIGHT ANTECUBITAL Performed at [facility] [address], [place], [place] [address]    Special Requests   Final    BOTTLES DRAWN AEROBIC AND ANAEROBIC Blood Culture results may not be optimal due to an excessive volume of blood received in culture bottles Performed at [facility] [address], [place], [place] [address]    Culture   Final    NO GROWTH 5 DAYS Performed at [facility], [place] [address], [place], [place] [address]    Report Status 08/01/2018 FINAL  Final  Culture, blood (routine x 2)     Status: None   Collection Time: 07/27/18  4:02 PM   Specimen: BLOOD  Result Value Ref Range Status   Specimen Description   Final    BLOOD PORTA CATH Performed at [facility], [place] [address], [place], [place] [address]    Special Requests   Final    BOTTLES DRAWN AEROBIC AND ANAEROBIC Blood Culture adequate volume Performed at [facility] [address], [place], [place] [address]    Culture   Final    NO GROWTH 5 DAYS Performed at [facility], [place] [address], [place], [place] [address]    Report Status 08/01/2018 FINAL  Final  SARS Coronavirus 2 (CEPHEID - Performed in [facility] hospital lab), Hosp Order     Status: None   Collection Time: 07/27/18  8:55 PM   Specimen: Nasopharyngeal Swab  Result Value Ref Range Status   SARS Coronavirus 2 NEGATIVE NEGATIVE Final    Comment: (NOTE) If result is NEGATIVE SARS-CoV-2 target nucleic acids are NOT DETECTED. The SARS-CoV-2 RNA is generally detectable in upper and lower  respiratory specimens during the acute phase of infection. The lowest  concentration of SARS-CoV-2 viral copies this assay can detect is 250  copies / mL. A negative result does not preclude SARS-CoV-2 infection  and should not be used as the sole basis for treatment or other  patient management decisions.  A negative result may occur with  improper specimen collection / handling, submission of specimen other  than nasopharyngeal swab, presence of viral mutation(s) within the  areas targeted by this assay, and inadequate number of viral copies  (<250 copies / mL). A negative result must be combined with clinical  observations, patient history, and epidemiological information. If result is POSITIVE SARS-CoV-2 target nucleic acids are DETECTED. The SARS-CoV-2 RNA is generally detectable in upper and lower   respiratory specimens dur ing the acute phase of infection.  Positive  results are indicative of active infection with SARS-CoV-2.  Clinical  correlation with patient history and other diagnostic information is  necessary to determine  patient infection status.  Positive results do  not rule out bacterial infection or co-infection with other viruses. If result is PRESUMPTIVE POSTIVE SARS-CoV-2 nucleic acids MAY BE PRESENT.   A presumptive positive result was obtained on the submitted specimen  and confirmed on repeat testing.  While 2019 novel coronavirus  (SARS-CoV-2) nucleic acids may be present in the submitted sample  additional confirmatory testing may be necessary for epidemiological  and / or clinical management purposes  to differentiate between  SARS-CoV-2 and other Sarbecovirus currently known to infect humans.  If clinically indicated additional testing with an alternate test  methodology [phone]) is advised. The SARS-CoV-2 RNA is generally  detectable in upper and lower respiratory sp ecimens during the acute  phase of infection. The expected result is Negative. Fact Sheet for Patients:  StrictlyIdeas.no Fact Sheet for Healthcare Providers: BankingDealers.co.za This test is not yet approved or cleared by the [place] FDA and has been authorized for detection and/or diagnosis of SARS-CoV-2 by FDA under an Emergency Use Authorization (EUA).  This EUA will remain in effect (meaning this test can be used) for the duration of the COVID-19 declaration under Section 564(b)(1) of the Act, 21 U.S.C. section 360bbb-3(b)(1), unless the authorization is terminated or revoked sooner. Performed at [facility], [place] [address], [place], [place] [address]   MRSA PCR Screening     Status: None   Collection Time: 07/27/18 10:22 PM   Specimen: Nasal Mucosa; Nasopharyngeal  Result Value Ref Range Status   MRSA by PCR  NEGATIVE NEGATIVE Final    Comment:        The GeneXpert MRSA Assay (FDA approved for NASAL specimens only), is one component of a comprehensive MRSA colonization surveillance program. It is not intended to diagnose MRSA infection nor to guide or monitor treatment for MRSA infections. Performed at [facility], [place] [address], [place], [place] [address]      Radiology Studies: No results found.  Scheduled Meds: . apixaban  5 mg Oral BID  . dronabinol  2.5 mg Oral BID AC  . feeding supplement (ENSURE ENLIVE)  237 mL Oral BID BM  . feeding supplement (PRO-STAT SUGAR FREE 64)  30 mL Oral BID  . gabapentin  400 mg Oral TID  . levofloxacin  750 mg Oral Daily  . mouth rinse  15 mL Mouth Rinse BID  . metoprolol tartrate  12.5 mg Oral BID  . multivitamin with minerals  1 tablet Oral Daily  . nicotine  21 mg Transdermal Q24H  . PARoxetine  20 mg Oral BID  . potassium chloride  20 mEq Oral Daily  . sodium chloride flush  10-40 mL Intracatheter Q12H   Continuous Infusions:   LOS: 7 days   Marylu Lund, MD [facility] Pager On Amion  If 7PM-7AM, please contact night-coverage 08/03/2018, 6:32 PM

## 2018-08-03 NOTE — Progress Notes (Signed)
Physical Therapy Treatment Patient Details Name: Deborah Mcintyre MRN: [id number] DOB: 1955-03-25 Today's Date: 08/03/2018    History of Present Illness 63 yo female admitted to ED on 7/23 for ShOB x3 days, imaging reveals RLL PE, large R pleural effusion with complete atelectasis of LLL, R lobe PNA. Palliative following, pt refuses hospice at this time. Pt with stage IV breast cancer with mets to lungs and pleura, pleurx drain 05/2018-07/28/2018 with history of chemo and lumpectomy/L mastectomy. PMH includes anxiety, OA, COPD on 2LO2 for comfort, depression, HTN, paresthesias of toes, PNA.    PT Comments    Pt minimally agreeable to OOB mobility, pt reporting moderate chest pain limiting mobility this session. Pt ambulated short in room distance, limited by pt. PT believes pt could have walked farther if pt so chose, as pt was steady and with no noticeable fatigue. Pt with LE weakness noticed in bed mobility, continuing to recommend HHPT for weakness and poor activity tolerance.    Follow Up Recommendations  Home health PT;Supervision for mobility/OOB     Equipment Recommendations  None recommended by PT    Recommendations for Other Services       Precautions / Restrictions Precautions Precautions: Fall Restrictions Weight Bearing Restrictions: No    Mobility  Bed Mobility Overal bed mobility: Needs Assistance Bed Mobility: Supine to Sit     Supine to sit: Supervision     General bed mobility comments: supervision for safety, increased time to perform.  Transfers Overall transfer level: Needs assistance Equipment used: Rolling walker (2 wheeled) Transfers: Sit to/from Stand Sit to Stand: Supervision         General transfer comment: supervision for safety. increased time to rise.  Ambulation/Gait Ambulation/Gait assistance: Supervision Gait Distance (Feet): 15 Feet Assistive device: None Gait Pattern/deviations: Step-through pattern;Decreased stride length;Trunk  flexed Gait velocity: decr   General Gait Details: supervision for safety, slow and steady gait with flexed trunk due to pt-reported chest pain. Distance limited to room distance by pt, pt on RA with sats 90%, supplemental O2 replaced after ambulation. HR up to 123 bpm during ambulation.   Stairs             Wheelchair Mobility    Modified Rankin (Stroke Patients Only)       Balance Overall balance assessment: Mild deficits observed, not formally tested                                          Cognition Arousal/Alertness: Awake/alert Behavior During Therapy: WFL for tasks assessed/performed;Flat affect Overall Cognitive Status: Within Functional Limits for tasks assessed                                        Exercises      General Comments        Pertinent Vitals/Pain Pain Assessment: 0-10 Pain Score: 6  Pain Location: chest Pain Descriptors / Indicators: Aching;Sharp Pain Intervention(s): Limited activity within patient's tolerance;Monitored during session;Repositioned    Home Living                      Prior Function            PT Goals (current goals can now be found in the care plan section) Acute Rehab PT Goals PT  Goal Formulation: With patient Time For Goal Achievement: 08/16/18 Potential to Achieve Goals: Good Progress towards PT goals: Progressing toward goals    Frequency    Min 3X/week      PT Plan Current plan remains appropriate    Co-evaluation              AM-PAC PT "6 Clicks" Mobility   Outcome Measure  Help needed turning from your back to your side while in a flat bed without using bedrails?: A Little Help needed moving from lying on your back to sitting on the side of a flat bed without using bedrails?: A Little Help needed moving to and from a bed to a chair (including a wheelchair)?: A Little Help needed standing up from a chair using your arms (e.g., wheelchair or  bedside chair)?: A Little Help needed to walk in hospital room?: A Little Help needed climbing 3-5 steps with a railing? : A Little 6 Click Score: 18    End of Session   Activity Tolerance: Patient limited by pain;Patient limited by fatigue Patient left: in bed;with nursing/sitter in room Nurse Communication: Other (comment) PT Visit Diagnosis: Other abnormalities of gait and mobility (R26.89);Muscle weakness (generalized) (M62.81)     Time: 1610-9604 PT Time Calculation (min) (ACUTE ONLY): 9 min  Charges:  $Gait Training: 8-22 mins                     Nicola Police, PT Acute Rehabilitation Services Pager [phone]  Office [phone]    Kalia Vahey D Tymeshia Awan 08/03/2018, 2:25 PM

## 2018-08-04 DIAGNOSIS — T451X5A Adverse effect of antineoplastic and immunosuppressive drugs, initial encounter: Secondary | ICD-10-CM

## 2018-08-04 DIAGNOSIS — D61810 Antineoplastic chemotherapy induced pancytopenia: Secondary | ICD-10-CM

## 2018-08-04 LAB — CBC WITH DIFFERENTIAL/PLATELET
Abs Immature Granulocytes: 0.05 10*3/uL (ref 0.00–0.07)
Basophils Absolute: 0 10*3/uL (ref 0.0–0.1)
Basophils Relative: 0 %
Eosinophils Absolute: 0 10*3/uL (ref 0.0–0.5)
Eosinophils Relative: 1 %
HCT: 30 % — ABNORMAL LOW (ref 36.0–46.0)
Hemoglobin: 8.4 g/dL — ABNORMAL LOW (ref 12.0–15.0)
Immature Granulocytes: 1 %
Lymphocytes Relative: 10 %
Lymphs Abs: 0.7 10*3/uL (ref 0.7–4.0)
MCH: 26.4 pg (ref 26.0–34.0)
MCHC: 28 g/dL — ABNORMAL LOW (ref 30.0–36.0)
MCV: 94.3 fL (ref 80.0–100.0)
Monocytes Absolute: 1.3 10*3/uL — ABNORMAL HIGH (ref 0.1–1.0)
Monocytes Relative: 19 %
Neutro Abs: 4.8 10*3/uL (ref 1.7–7.7)
Neutrophils Relative %: 69 %
Platelets: 397 10*3/uL (ref 150–400)
RBC: 3.18 MIL/uL — ABNORMAL LOW (ref 3.87–5.11)
RDW: 16.5 % — ABNORMAL HIGH (ref 11.5–15.5)
WBC: 6.9 10*3/uL (ref 4.0–10.5)
nRBC: 0 % (ref 0.0–0.2)

## 2018-08-04 MED ORDER — HEPARIN SOD (PORK) LOCK FLUSH 100 UNIT/ML IV SOLN
500.0000 [IU] | INTRAVENOUS | Status: AC | PRN
  Administered 2018-08-04: 15:00:00 500 [IU]

## 2018-08-04 MED ORDER — METOPROLOL TARTRATE 25 MG PO TABS: 12.5000 mg | ORAL_TABLET | Freq: Two times a day (BID) | ORAL | 0 refills | Status: AC

## 2018-08-04 NOTE — Progress Notes (Signed)
Patient discharged home.  Port de-accessed per IV team.  Reviewed AVS and medications with patient as well as her husband over the phone.  Emphasized bleeding precautions with new medication, eliquis.  Verbalized understanding. No questions at this time.  Patient in NAD. Awaiting arrival of husband for pick up.

## 2018-08-04 NOTE — Discharge Summary (Signed)
Physician Discharge Summary  LANEYA GASAWAY OFB:[id number] DOB: 10/23/1955 DOA: 07/27/2018  PCP: [name], MD  Admit date: 07/27/2018 Discharge date: 08/04/2018  Admitted From: Home Disposition:  Home  Recommendations for Outpatient Follow-up:  1. Follow up with PCP in 1-2 weeks  Discharge Condition:Stable CODE STATUS:Full Diet recommendation: Regular   Brief/Interim Summary: 63 y.o.femalewith medical history significant ofanxiety, depression, osteoarthritis, bronchitis, COPD, hypertension, nocturia, bilateral tingling and numbness of toes, history of healthcare associated pneumonia at discharged9 days ago, history of breast cancer who had a chest tube placement due to a left-sided malignant pleural effusion and is coming to the emergency department due to his lack of drainage associated with 3 days of progressively worse dyspnea and intermittent chest pain. EMS reported that the patient has sinus tachycardia but went momentarily into A. fib prior to arrival. Patient denies fever, chills, but feels fatigued, decreased appetite and sleep. She denies rhinorrhea, sore throat, or hemoptysis. She has occasional wheezing. Occasional postural dizziness, no palpitations, diaphoresis, PND, orthopnea or pitting edema of the lower extremities. Her chest pain is pleuritic. Denies abdominal pain, nausea, emesis, diarrhea, constipation, melena or hematochezia. No dysuria, frequency or hematuria. No polyuria, polydipsia, polyphagia or blurred vision.  Discharge Diagnoses:  Principal Problem:   Pulmonary emboli (HCC) Active Problems:   Antineoplastic chemotherapy induced pancytopenia (HCC)   Hypertension   COPD (chronic obstructive pulmonary disease) (HCC)   Anxiety and depression   DNR (do not resuscitate) discussion   Tobacco abuse   Chest pain   Pleural effusion on left   Hypokalemia   Malnutrition of moderate degree   Metastatic breast cancer ([place])   Palliative care by  specialist   Weakness generalized   Cancer associated pain  Pulmonary embolus - CTA chest as below - Continue supplemental oxygen. - Continued on eliquis - LE dopplers ordered; negative b/l  RLL PNA - as seen on CTA PE - she immune compromised from chemo - start lvq for HCAP, monitor - continue lvq for 7 total days abx tx; transition to PO lvq in AM - planned lvq through 08/04/2018  Chest tube dysfunction - spoke with PCCM about CT drainage problems; recommending discontinuing CT; IR has been consulted - chest tube since removed  Antineoplastic chemotherapy induced pancytopenia ([place]) - Her platelets had normalized, but continues to be anemic/neutropenic. - Monitor CBC. - Follow-up with oncology as scheduled.  Hypertension - amlodipine, metoprolol - initially held BP meds given hypotension - continue holding fluids      - Given sinus tach, have resumed lower dosed metoprolol  COPD - Continue supplemental oxygen. - Bronchodilators as needed.  Anxiety and depression - Continue paroxetine  Tobacco abuse - Nicotine replacement therapy as needed.  Chest pain - Analgesics as needed.  Hypokalemia - replete, monitor  Normocytic anemia - no frank bleed noted  Discharge Instructions   Allergies as of 08/04/2018      Reactions   Fentanyl Nausea And Vomiting, Other (See Comments)   Headache   Morphine And Related Nausea And Vomiting, Other (See Comments)   Headache      Medication List    STOP taking these medications   amLODipine 5 MG tablet Commonly known as: NORVASC   benzonatate 200 MG capsule Commonly known as: TESSALON   dexamethasone 1 MG tablet Commonly known as: DECADRON   nystatin 100000 UNIT/ML suspension Commonly known as: MYCOSTATIN   oxyCODONE-acetaminophen 5-325 MG tablet Commonly known as: PERCOCET/ROXICET Replaced by:  oxyCODONE-acetaminophen 10-325 MG tablet     TAKE  these medications   albuterol 108 (90 Base) MCG/ACT inhaler Commonly known as: VENTOLIN HFA Inhale 2 puffs into the lungs every 6 (six) hours as needed for wheezing or shortness of breath.   albuterol (2.5 MG/3ML) 0.083% nebulizer solution Commonly known as: PROVENTIL Take 3 mLs (2.5 mg total) by nebulization every 6 (six) hours as needed for up to 30 days for wheezing or shortness of breath.   ALPRAZolam 0.5 MG tablet Commonly known as: XANAX Take 1 tablet (0.5 mg total) by mouth 3 (three) times daily as needed for anxiety or sleep.   apixaban 5 MG Tabs tablet Commonly known as: ELIQUIS Take 1 tablet (5 mg total) by mouth 2 (two) times daily.   aspirin EC 81 MG tablet Take 81 mg by mouth daily.   dronabinol 2.5 MG capsule Commonly known as: MARINOL Take 1 capsule (2.5 mg total) by mouth 2 (two) times daily before a meal.   gabapentin 400 MG capsule Commonly known as: NEURONTIN Take 1 capsule (400 mg total) by mouth 3 (three) times daily.   levofloxacin 750 MG tablet Commonly known as: LEVAQUIN Take 1 tablet (750 mg total) by mouth daily for 2 doses.   lidocaine-prilocaine cream Commonly known as: EMLA Apply to affected area once What changed:   how much to take  how to take this  when to take this  reasons to take this  additional instructions   metoprolol tartrate 25 MG tablet Commonly known as: LOPRESSOR Take 0.5 tablets (12.5 mg total) by mouth 2 (two) times daily. What changed: how much to take   multivitamin with minerals Tabs tablet Take 1 tablet by mouth daily.   nicotine 21 mg/24hr patch Commonly known as: NICODERM CQ - dosed in mg/24 hours Place 1 patch (21 mg total) onto the skin daily.   ondansetron 8 MG tablet Commonly known as: ZOFRAN Take 1 tablet (8 mg total) by mouth every 8 (eight) hours as needed for nausea.   oxyCODONE-acetaminophen 10-325 MG tablet Commonly known as:  Percocet Take 1 tablet by mouth every 6 (six) hours as needed for up to 5 days for pain. Replaces: oxyCODONE-acetaminophen 5-325 MG tablet   PARoxetine 20 MG tablet Commonly known as: PAXIL Take 1 tablet (20 mg total) by mouth 2 (two) times daily.   potassium chloride 10 MEQ tablet Commonly known as: K-DUR Take 2 tablets (20 mEq total) by mouth daily.   Premarin vaginal cream Generic drug: conjugated estrogens INSERT 1 APPLICATORFUL VAGINALLY DAILY What changed: See the new instructions.   prochlorperazine 10 MG tablet Commonly known as: COMPAZINE Take 1 tablet (10 mg total) by mouth every 6 (six) hours as needed (Nausea or vomiting). What changed: reasons to take this   promethazine 12.5 MG tablet Commonly known as: PHENERGAN Take 1 tablet (12.5 mg total) by mouth every 6 (six) hours as needed for nausea or vomiting.      Follow-up Information    [name], MD. Schedule an appointment as soon as possible for a visit in 1 week(s).   Specialty: Cardiology Contact information: [place] [address] [phone]          Allergies  Allergen Reactions  . Fentanyl Nausea And Vomiting and Other (See Comments)    Headache  . Morphine And Related Nausea And Vomiting and Other (See Comments)    Headache    Consultations:  Palliative Care  PCCM  Procedures/Studies: Dg Chest 2 View  Result Date: 07/14/2018 CLINICAL DATA:  63 year old with  current history of stage IV LEFT lung cancer and chronic malignant LEFT pleural effusion with a PleurX catheter in place. Patient presents stating that the PleurX catheter is no longer draining and complains of acute shortness of breath. EXAM: CHEST - 2 VIEW COMPARISON:  CT chest 06/30/2018 and earlier. Chest x-ray 06/30/2018. FINDINGS: Moderate-sized LEFT pleural effusion, localized to the base and mid LEFT hemithorax. The PleurX catheter is positioned posteriorly and medially in the LEFT hemithorax. Associated  consolidation in the LEFT LOWER LOBE and lingula. Small nodule involving the RIGHT mid lung as noted on the recent CT. RIGHT lung otherwise clear. No RIGHT pleural effusion. Cardiac silhouette normal in size, unchanged. Thoracic aorta mildly atherosclerotic, unchanged. RIGHT jugular Port-A-Cath tip in the LOWER SVC. Degenerative changes involving thoracic spine. IMPRESSION: 1. Moderate-sized LEFT pleural effusion localized to the base and mid LEFT hemithorax. Associated passive atelectasis and/or pneumonia involving the LEFT LOWER LOBE and lingula. 2. The PleurX catheter is positioned posteriorly and medially in the LEFT hemithorax. 3. Small nodule involving the RIGHT mid lung indicating metastasis, as noted on the recent CT. Electronically Signed   By: Evangeline Dakin M.D.   On: 07/14/2018 16:16   Ct Angio Chest Pe W And/or Wo Contrast  Result Date: 07/27/2018 CLINICAL DATA:  Breast and lung cancer, chest tube, shortness of breath for 3 days, tube not draining properly, elevated D-dimer, question pulmonary embolism EXAM: CT ANGIOGRAPHY CHEST WITH CONTRAST TECHNIQUE: Multidetector CT imaging of the chest was performed using the standard protocol during bolus administration of intravenous contrast. Multiplanar CT image reconstructions and MIPs were obtained to evaluate the vascular anatomy. CONTRAST:  170mL OMNIPAQUE IOHEXOL 350 MG/ML SOLN COMPARISON:  06/30/2018 FINDINGS: Cardiovascular: Atherosclerotic calcifications of thoracic aorta. Ascending aorta 4.0 cm transverse. No evidence of dissection. No pericardial effusion. Pulmonary arteries adequately opacified. Small pulmonary embolus identified within a RIGHT lower lobe pulmonary artery, medial basal segment. No additional pulmonary emboli are definitely visualized. RIGHT jugular Port-A-Cath with tip in SVC. Few additional normal sized Mediastinum/Nodes: Probable enlarged precarinal lymph node 15 mm short axis image 33. 13 mm short axis AP window node image  36. Subcarinal adenopathy 23 mm short axis previously 22 mm. Esophagus unremarkable. Base of cervical region normal appearance. No axillary adenopathy. Prior LEFT mastectomy. Lungs/Pleura: Large loculated LEFT pleural effusion despite thoracostomy tube. Complete collapse of the LEFT upper lobe with abrupt cut off of air within LEFT upper lobe bronchus. Mild compressive atelectasis of LEFT lower lobe with narrowing of LEFT lower lobe airways and peribronchial thickening. Patchy areas of opacity in periphery of RIGHT lung, greater in RIGHT lower lobe, favor pneumonia though attention on follow-up recommended to exclude developing tumor. No pneumothorax. No RIGHT pleural effusion. Upper Abdomen: 14 mm short axis gastrohepatic ligament lymph node image 90. Visualized upper abdomen otherwise unremarkable Musculoskeletal: No acute osseous findings. Review of the MIP images confirms the above findings. IMPRESSION: Small pulmonary embolus in RIGHT lower lobe medially. Large loculated RIGHT pleural effusion with complete atelectasis of LEFT upper lobe and partial atelectasis of LEFT lower lobe. Patchy opacities in RIGHT lung favor pneumonia, slightly increased in RIGHT lower lobe since previous exam, recommend follow-up until resolution to exclude underlying mass. Persistent mediastinal adenopathy. Mildly enlarged gastrohepatic ligament lymph node. Aortic Atherosclerosis (ICD10-I70.0). Findings called to Dr. Regenia Skeeter on 07/27/2018 at 1904 hrs. Electronically Signed   By: Lavonia Dana M.D.   On: 07/27/2018 19:05   Dg Chest Portable 1 View  Result Date: 07/27/2018 CLINICAL DATA:  Dyspnea and short  of breath. History of breast cancer and lung cancer. Chest tube EXAM: PORTABLE CHEST 1 VIEW COMPARISON:  07/14/2018 FINDINGS: Progression of left effusion compared to the prior study. Left pleural drainage catheter remains in place unchanged. No pneumothorax. Compressive atelectasis or infiltrate throughout the left lung has  progressed Right lung clear.  Port-A-Cath tip SVC. IMPRESSION: Interval progression of left effusion and airspace disease on the left likely due to metastatic disease. Electronically Signed   By: Franchot Gallo M.D.   On: 07/27/2018 15:39   Vas [place] Lower Extremity Venous (dvt)  Result Date: 07/28/2018  Lower Venous Study Indications: Pulmonary embolism.  Risk Factors: Confirmed PE. Comparison Study: No prior studies. Performing Technologist: Oliver Hum RVT  Examination Guidelines: A complete evaluation includes B-mode imaging, spectral Doppler, color Doppler, and power Doppler as needed of all accessible portions of each vessel. Bilateral testing is considered an integral part of a complete examination. Limited examinations for reoccurring indications may be performed as noted.  +---------+---------------+---------+-----------+----------+-------+ RIGHT    CompressibilityPhasicitySpontaneityPropertiesSummary +---------+---------------+---------+-----------+----------+-------+ CFV      Full           Yes      Yes                          +---------+---------------+---------+-----------+----------+-------+ SFJ      Full                                                 +---------+---------------+---------+-----------+----------+-------+ FV Prox  Full                                                 +---------+---------------+---------+-----------+----------+-------+ FV Mid   Full                                                 +---------+---------------+---------+-----------+----------+-------+ FV DistalFull                                                 +---------+---------------+---------+-----------+----------+-------+ PFV      Full                                                 +---------+---------------+---------+-----------+----------+-------+ POP      Full           Yes      Yes                           +---------+---------------+---------+-----------+----------+-------+ PTV      Full                                                 +---------+---------------+---------+-----------+----------+-------+  PERO     Full                                                 +---------+---------------+---------+-----------+----------+-------+   +---------+---------------+---------+-----------+----------+-------+ LEFT     CompressibilityPhasicitySpontaneityPropertiesSummary +---------+---------------+---------+-----------+----------+-------+ CFV      Full           Yes      Yes                          +---------+---------------+---------+-----------+----------+-------+ SFJ      Full                                                 +---------+---------------+---------+-----------+----------+-------+ FV Prox  Full                                                 +---------+---------------+---------+-----------+----------+-------+ FV Mid   Full                                                 +---------+---------------+---------+-----------+----------+-------+ FV DistalFull                                                 +---------+---------------+---------+-----------+----------+-------+ PFV      Full                                                 +---------+---------------+---------+-----------+----------+-------+ POP      Full           Yes      Yes                          +---------+---------------+---------+-----------+----------+-------+ PTV      Full                                                 +---------+---------------+---------+-----------+----------+-------+ PERO     Full                                                 +---------+---------------+---------+-----------+----------+-------+     Summary: Right: There is no evidence of deep vein thrombosis in the lower extremity. No cystic structure found in the popliteal fossa. Left: There is  no evidence of deep vein thrombosis in the lower extremity. No cystic structure found in the popliteal fossa.  *See table(s) above for measurements  and observations. Electronically signed by Monica Martinez MD on 07/28/2018 at 5:45:05 PM.    Final    Ir Removal Of Plural Cath W/cuff  Result Date: 07/28/2018 INDICATION: Patient with history of metastatic breast cancer with recurrent left pleural effusion s/p tunneled left pleurX catheter placement in IR 06/02/2018 by Dr. Pascal Lux; with subsequent development of loculated left pleural effusion and nonfunctioning pleurX catheter. Request is made for removal of tunneled left pleurX catheter. EXAM: REMOVAL OF TUNNELED LEFT PLEURX CATHETER MEDICATIONS: 10 mL of 1% Lidocaine COMPLICATIONS: None immediate. PROCEDURE: Informed written consent was obtained from the patient following an explanation of the procedure, risks, benefits and alternatives to treatment. A time out was performed prior to the initiation of the procedure. Maximal barrier sterile technique was utilized including mask, face shield, sterile gloves, large sterile drape, hand hygiene, and Betadine. 1% lidocaine was injected under sterile conditions along the subcutaneous tunnel. Utilizing gentle traction, the catheter was removed intact. Hemostasis was obtained with manual compression. A pressure dressing was placed. The patient tolerated the procedure well without immediate post procedural complication. IMPRESSION: Successful removal of tunneled left pleurX catheter. Read by: Earley Abide, PA-C Electronically Signed   By: Jacqulynn Cadet M.D.   On: 07/28/2018 16:53   Ir Thoracentesis Asp Pleural Space W/img Guide  Result Date: 07/07/2018 INDICATION: History of metastatic breast cancer with recurrent symptomatic left-sided pleural effusion post image guided placement of a tunneled pleural drainage catheter on 06/02/2018 Patient had recent episode of pleural drainage catheter malfunction which resolved  following the administration of a tPA dwell. Unfortunately, patient reports persistent difficulties with aspiration from the left-sided tunneled pleural catheter and as such presents to the Interventional Radiology Department for evaluation and management. Note, chest CT performed 06/30/2018 demonstrated appropriate positioning of the left-sided tunneled pleural drainage catheter though note is made of suspected loculations involving the left-sided pleural fluid. EXAM: IR THORACENTESIS ASP PLEURAL SPACE W/IMG GUIDE COMPARISON:  Image guided left-sided tunneled pleural drainage catheter placement-06/02/2018; chest CT-06/30/2018 MEDICATIONS: None ANESTHESIA/SEDATION: None CONTRAST:  None COMPLICATIONS: None immediate. PROCEDURE: Prior to more invasive measures, the patient's existing tunneled pleural drainage catheter was flushed with approximately 5 cc of saline and then connected to a standard pleura vac suction device yielding the brisk return of approximately 700 cc of slightly blood-tinged pleural fluid. Additional fluid was not aspirated at this time secondary to patient experiencing a symptomatic cough. IMPRESSION: Successful aspiration of 700 cc of slightly blood-tinged pleural fluid from the appropriately functioning left-sided pleural drainage catheter. Electronically Signed   By: Sandi Mariscal M.D.   On: 07/07/2018 17:39     Subjective: Without acute complaints this AM  Discharge Exam: Vitals:   08/03/18 2056 08/04/18 0531  BP: (!) 136/93 110/76  Pulse: (!) 115 99  Resp: (!) 22 14  Temp: 99.4 F (37.4 C) 98.4 F (36.9 C)  SpO2: 96% 100%   Vitals:   08/03/18 0930 08/03/18 1359 08/03/18 2056 08/04/18 0531  BP:  (!) 147/87 (!) 136/93 110/76  Pulse:  (!) 116 (!) 115 99  Resp:  14 (!) 22 14  Temp:  99.1 F (37.3 C) 99.4 F (37.4 C) 98.4 F (36.9 C)  TempSrc:  Oral Oral Oral  SpO2:  100% 96% 100%  Weight: 74.1 kg     Height:        General: Pt is alert, awake, not in acute  distress Cardiovascular: RRR, S1/S2 +, no rubs, no gallops Respiratory: CTA bilaterally, no wheezing, no rhonchi Abdominal: Soft, NT,  ND, bowel sounds + Extremities: no edema, no cyanosis   The results of significant diagnostics from this hospitalization (including imaging, microbiology, ancillary and laboratory) are listed below for reference.     Microbiology: Recent Results (from the past 240 hour(s))  Culture, blood (routine x 2)     Status: None   Collection Time: 07/27/18  3:37 PM   Specimen: BLOOD  Result Value Ref Range Status   Specimen Description   Final    BLOOD RIGHT ANTECUBITAL Performed at [facility] [address] [place], [place] [address]    Special Requests   Final    BOTTLES DRAWN AEROBIC AND ANAEROBIC Blood Culture results may not be optimal due to an excessive volume of blood received in culture bottles Performed at [facility] [address], [place], [place] [address]    Culture   Final    NO GROWTH 5 DAYS Performed at [facility], [place] [address], [place], [place] [address]    Report Status 08/01/2018 FINAL  Final  Culture, blood (routine x 2)     Status: None   Collection Time: 07/27/18  4:02 PM   Specimen: BLOOD  Result Value Ref Range Status   Specimen Description   Final    BLOOD PORTA CATH Performed at [facility], [place] [address] [place], [place] [address]    Special Requests   Final    BOTTLES DRAWN AEROBIC AND ANAEROBIC Blood Culture adequate volume Performed at [facility] [address], [place], [place] [address]    Culture   Final    NO GROWTH 5 DAYS Performed at [facility], [place] [address] [place], [place] [address]    Report Status 08/01/2018 FINAL  Final  SARS Coronavirus 2 (CEPHEID - Performed in [facility] hospital lab), Hosp Order     Status: None   Collection Time: 07/27/18  8:55 PM   Specimen: Nasopharyngeal Swab  Result Value Ref Range  Status   SARS Coronavirus 2 NEGATIVE NEGATIVE Final    Comment: (NOTE) If result is NEGATIVE SARS-CoV-2 target nucleic acids are NOT DETECTED. The SARS-CoV-2 RNA is generally detectable in upper and lower  respiratory specimens during the acute phase of infection. The lowest  concentration of SARS-CoV-2 viral copies this assay can detect is 250  copies / mL. A negative result does not preclude SARS-CoV-2 infection  and should not be used as the sole basis for treatment or other  patient management decisions.  A negative result may occur with  improper specimen collection / handling, submission of specimen other  than nasopharyngeal swab, presence of viral mutation(s) within the  areas targeted by this assay, and inadequate number of viral copies  (<250 copies / mL). A negative result must be combined with clinical  observations, patient history, and epidemiological information. If result is POSITIVE SARS-CoV-2 target nucleic acids are DETECTED. The SARS-CoV-2 RNA is generally detectable in upper and lower  respiratory specimens dur ing the acute phase of infection.  Positive  results are indicative of active infection with SARS-CoV-2.  Clinical  correlation with patient history and other diagnostic information is  necessary to determine patient infection status.  Positive results do  not rule out bacterial infection or co-infection with other viruses. If result is PRESUMPTIVE POSTIVE SARS-CoV-2 nucleic acids MAY BE PRESENT.   A presumptive positive result was obtained on the submitted specimen  and confirmed on repeat testing.  While 2019 novel  coronavirus  (SARS-CoV-2) nucleic acids may be present in the submitted sample  additional confirmatory testing may be necessary for epidemiological  and / or clinical management purposes  to differentiate between  SARS-CoV-2 and other Sarbecovirus currently known to infect humans.  If clinically indicated additional testing with an alternate  test  methodology [phone]) is advised. The SARS-CoV-2 RNA is generally  detectable in upper and lower respiratory sp ecimens during the acute  phase of infection. The expected result is Negative. Fact Sheet for Patients:  StrictlyIdeas.no Fact Sheet for Healthcare Providers: BankingDealers.co.za This test is not yet approved or cleared by the [place] FDA and has been authorized for detection and/or diagnosis of SARS-CoV-2 by FDA under an Emergency Use Authorization (EUA).  This EUA will remain in effect (meaning this test can be used) for the duration of the COVID-19 declaration under Section 564(b)(1) of the Act, 21 U.S.C. section 360bbb-3(b)(1), unless the authorization is terminated or revoked sooner. Performed at [facility], [place] [address], [place], [place] [address]   MRSA PCR Screening     Status: None   Collection Time: 07/27/18 10:22 PM   Specimen: Nasal Mucosa; Nasopharyngeal  Result Value Ref Range Status   MRSA by PCR NEGATIVE NEGATIVE Final    Comment:        The GeneXpert MRSA Assay (FDA approved for NASAL specimens only), is one component of a comprehensive MRSA colonization surveillance program. It is not intended to diagnose MRSA infection nor to guide or monitor treatment for MRSA infections. Performed at [facility] Home, [place] [address] [place], [place] [address]      Labs: BNP (last 3 results) Recent Labs    07/27/18 1602  BNP 65.4   Basic Metabolic Panel: Recent Labs  Lab 07/29/18 0307 07/31/18 0500 07/31/18 0502 08/01/18 0449 08/02/18 0523  NA 139 140  --  140 140  K 4.0 3.8  --  4.1 3.8  CL 101 104  --  99 100  CO2 26 27  --  32 32  GLUCOSE 116* 94  --  105* 113*  BUN 5* 8  --  8 9  CREATININE 0.58 0.52  --  0.60 0.55  CALCIUM 8.2* 8.1*  --  8.3* 8.3*  MG  --   --  1.7 1.8 1.9  PHOS  --  3.4  --  3.2 3.0   Liver Function Tests: Recent Labs   Lab 07/29/18 0307 07/31/18 0500 08/01/18 0449 08/02/18 0523  AST 9*  --   --   --   ALT 7  --   --   --   ALKPHOS 49  --   --   --   BILITOT 0.6  --   --   --   PROT 5.7*  --   --   --   ALBUMIN 2.3* 2.2* 2.3* 2.4*   No results for input(s): LIPASE, AMYLASE in the last 168 hours. No results for input(s): AMMONIA in the last 168 hours. CBC: Recent Labs  Lab 07/31/18 0417 08/01/18 0449 08/01/18 2246 08/02/18 0523 08/03/18 0348 08/04/18 0441  WBC 1.6* 1.8*  --  3.0* 4.8 6.9  NEUTROABS 0.2* 0.6*  --  1.3* 2.7 4.8  HGB 7.5* 7.0* 8.4* 8.5* 8.3* 8.4*  HCT 27.3* 25.7* 29.0* 29.7* 28.9* 30.0*  MCV 95.5 95.9  --  94.3 94.1 94.3  PLT 425* 396  --  381 366 397   Cardiac Enzymes: No results for input(s): CKTOTAL, CKMB,  CKMBINDEX, TROPONINI in the last 168 hours. BNP: Invalid input(s): POCBNP CBG: No results for input(s): GLUCAP in the last 168 hours. D-Dimer No results for input(s): DDIMER in the last 72 hours. Hgb A1c No results for input(s): HGBA1C in the last 72 hours. Lipid Profile No results for input(s): CHOL, HDL, LDLCALC, TRIG, CHOLHDL, LDLDIRECT in the last 72 hours. Thyroid function studies No results for input(s): TSH, T4TOTAL, T3FREE, THYROIDAB in the last 72 hours.  Invalid input(s): FREET3 Anemia work up No results for input(s): VITAMINB12, FOLATE, FERRITIN, TIBC, IRON, RETICCTPCT in the last 72 hours. Urinalysis    Component Value Date/Time   COLORURINE YELLOW 12/06/2014 2207   APPEARANCEUR CLOUDY (A) 12/06/2014 2207   LABSPEC 1.025 12/06/2014 2207   PHURINE 5.5 12/06/2014 2207   GLUCOSEU NEGATIVE 12/06/2014 2207   HGBUR MODERATE (A) 12/06/2014 2207   BILIRUBINUR NEGATIVE 12/06/2014 2207   KETONESUR NEGATIVE 12/06/2014 2207   PROTEINUR NEGATIVE 12/06/2014 2207   UROBILINOGEN 1.0 07/30/2013 1527   NITRITE POSITIVE (A) 12/06/2014 2207   LEUKOCYTESUR SMALL (A) 12/06/2014 2207   Sepsis Labs Invalid input(s): PROCALCITONIN,  WBC,   LACTICIDVEN Microbiology Recent Results (from the past 240 hour(s))  Culture, blood (routine x 2)     Status: None   Collection Time: 07/27/18  3:37 PM   Specimen: BLOOD  Result Value Ref Range Status   Specimen Description   Final    BLOOD RIGHT ANTECUBITAL Performed at [facility], [employer] [address], [place], [place] [address]    Special Requests   Final    BOTTLES DRAWN AEROBIC AND ANAEROBIC Blood Culture results may not be optimal due to an excessive volume of blood received in culture bottles Performed at [place] [address], [place], [place] [address]    Culture   Final    NO GROWTH 5 DAYS Performed at [facility], [place] [address] [place], [place] [address]    Report Status 08/01/2018 FINAL  Final  Culture, blood (routine x 2)     Status: None   Collection Time: 07/27/18  4:02 PM   Specimen: BLOOD  Result Value Ref Range Status   Specimen Description   Final    BLOOD PORTA CATH Performed at [facility], [place] [address], [place], [place] [address]    Special Requests   Final    BOTTLES DRAWN AEROBIC AND ANAEROBIC Blood Culture adequate volume Performed at [place] [address], [place], [place] [address]    Culture   Final    NO GROWTH 5 DAYS Performed at [facility], [place] [address], [place], [place] [address]    Report Status 08/01/2018 FINAL  Final  SARS Coronavirus 2 (CEPHEID - Performed in [facility] hospital lab), Hosp Order     Status: None   Collection Time: 07/27/18  8:55 PM   Specimen: Nasopharyngeal Swab  Result Value Ref Range Status   SARS Coronavirus 2 NEGATIVE NEGATIVE Final    Comment: (NOTE) If result is NEGATIVE SARS-CoV-2 target nucleic acids are NOT DETECTED. The SARS-CoV-2 RNA is generally detectable in upper and lower  respiratory specimens during the acute phase of infection. The lowest  concentration of SARS-CoV-2 viral copies this assay can detect is  250  copies / mL. A negative result does not preclude SARS-CoV-2 infection  and should not be used as the sole basis for treatment or other  patient management decisions.  A negative result may occur  with  improper specimen collection / handling, submission of specimen other  than nasopharyngeal swab, presence of viral mutation(s) within the  areas targeted by this assay, and inadequate number of viral copies  (<250 copies / mL). A negative result must be combined with clinical  observations, patient history, and epidemiological information. If result is POSITIVE SARS-CoV-2 target nucleic acids are DETECTED. The SARS-CoV-2 RNA is generally detectable in upper and lower  respiratory specimens dur ing the acute phase of infection.  Positive  results are indicative of active infection with SARS-CoV-2.  Clinical  correlation with patient history and other diagnostic information is  necessary to determine patient infection status.  Positive results do  not rule out bacterial infection or co-infection with other viruses. If result is PRESUMPTIVE POSTIVE SARS-CoV-2 nucleic acids MAY BE PRESENT.   A presumptive positive result was obtained on the submitted specimen  and confirmed on repeat testing.  While 2019 novel coronavirus  (SARS-CoV-2) nucleic acids may be present in the submitted sample  additional confirmatory testing may be necessary for epidemiological  and / or clinical management purposes  to differentiate between  SARS-CoV-2 and other Sarbecovirus currently known to infect humans.  If clinically indicated additional testing with an alternate test  methodology [phone]) is advised. The SARS-CoV-2 RNA is generally  detectable in upper and lower respiratory sp ecimens during the acute  phase of infection. The expected result is Negative. Fact Sheet for Patients:  StrictlyIdeas.no Fact Sheet for Healthcare  Providers: BankingDealers.co.za This test is not yet approved or cleared by the [place] FDA and has been authorized for detection and/or diagnosis of SARS-CoV-2 by FDA under an Emergency Use Authorization (EUA).  This EUA will remain in effect (meaning this test can be used) for the duration of the COVID-19 declaration under Section 564(b)(1) of the Act, 21 U.S.C. section 360bbb-3(b)(1), unless the authorization is terminated or revoked sooner. Performed at [facility], [place] [address], [place], [place] [address]   MRSA PCR Screening     Status: None   Collection Time: 07/27/18 10:22 PM   Specimen: Nasal Mucosa; Nasopharyngeal  Result Value Ref Range Status   MRSA by PCR NEGATIVE NEGATIVE Final    Comment:        The GeneXpert MRSA Assay (FDA approved for NASAL specimens only), is one component of a comprehensive MRSA colonization surveillance program. It is not intended to diagnose MRSA infection nor to guide or monitor treatment for MRSA infections. Performed at [facility], [place] [address], [place], [place] [address]    Time spent: 30 min  SIGNED:   Marylu Lund, MD  [facility] 08/04/2018, 1:29 PM  If 7PM-7AM, please contact night-coverage

## 2018-08-04 NOTE — Progress Notes (Signed)
Spoke with pt's husband concerning discharge needs. He was okay with [facility]. There are no other needs at present time.

## 2018-08-04 NOTE — Care Management Important Message (Signed)
Important Message  Patient Details IM Letter given to Cookie McGibboney RN to present to the Patient Name: Deborah Mcintyre MRN: [id number] Date of Birth: [birth year]   Medicare Important Message Given:  Yes     Kerin Salen 08/04/2018, 11:51 AM

## 2018-08-04 NOTE — Discharge Instructions (Signed)
Information on my medicine - ELIQUIS (apixaban)  Why was Eliquis prescribed for you? Eliquis was prescribed to treat blood clots that may have been found in the veins of your legs (deep vein thrombosis) or in your lungs (pulmonary embolism) and to reduce the risk of them occurring again.  What do You need to know about Eliquis ? The starting dose is 10 mg (two 5 mg tablets) taken TWICE daily for the FIRST SEVEN (7) DAYS, then on (enter date)  08/03/18 the dose is reduced to ONE 5 mg tablet taken TWICE daily.  Eliquis may be taken with or without food.    Try to take the dose about the same time in the morning and in the evening. If you have difficulty swallowing the tablet whole please discuss with your pharmacist how to take the medication safely.  Take Eliquis exactly as prescribed and DO NOT stop taking Eliquis without talking to the doctor who prescribed the medication.  Stopping may increase your risk of developing a new blood clot.  Refill your prescription before you run out.  After discharge, you should have regular check-up appointments with your healthcare provider that is prescribing your Eliquis.    What do you do if you miss a dose? If a dose of ELIQUIS is not taken at the scheduled time, take it as soon as possible on the same day and twice-daily administration should be resumed. The dose should not be doubled to make up for a missed dose.  Important Safety Information A possible side effect of Eliquis is bleeding. You should call your healthcare provider right away if you experience any of the following: ? Bleeding from an injury or your nose that does not stop. ? Unusual colored urine (red or dark brown) or unusual colored stools (red or black). ? Unusual bruising for unknown reasons. ? A serious fall or if you hit your head (even if there is no bleeding).  Some medicines may interact with Eliquis and might increase your risk of bleeding or clotting while on  Eliquis. To help avoid this, consult your healthcare provider or pharmacist prior to using any new prescription or non-prescription medications, including herbals, vitamins, non-steroidal anti-inflammatory drugs (NSAIDs) and supplements.  This website has more information on Eliquis (apixaban): http://www.eliquis.com/eliquis/home

## 2018-08-05 ENCOUNTER — Inpatient Hospital Stay: Payer: Medicare Other

## 2018-08-05 ENCOUNTER — Telehealth: Payer: Self-pay | Admitting: *Deleted

## 2018-08-05 ENCOUNTER — Inpatient Hospital Stay: Payer: Medicare Other | Admitting: Hematology and Oncology

## 2018-08-05 DIAGNOSIS — C50412 Malignant neoplasm of upper-outer quadrant of left female breast: Secondary | ICD-10-CM

## 2018-08-05 NOTE — Telephone Encounter (Signed)
Received call from pt husband Jeneen Rinks stating pt was discharged from hospital yesterday and is not sure if she wants to receive hospice care at home or if she wants to continue with treatments.  RN educated husband that it would be good to allow the hospice nurse to come to the home and sit down and talk with them about options and what hospice has to offer.  Referral placed to [facility].  Pt husband states they will take the weekend to think about it. RN will follow up on Monday 08/08/2018.

## 2018-08-06 ENCOUNTER — Other Ambulatory Visit: Payer: Self-pay

## 2018-08-06 ENCOUNTER — Observation Stay (HOSPITAL_COMMUNITY): Payer: Medicare Other

## 2018-08-06 ENCOUNTER — Emergency Department (HOSPITAL_COMMUNITY): Payer: Medicare Other

## 2018-08-06 ENCOUNTER — Observation Stay (HOSPITAL_COMMUNITY)
Admission: EM | Admit: 2018-08-06 | Discharge: 2018-08-07 | Disposition: A | Discharge status: Home / Self Care | Payer: Medicare Other | Attending: Internal Medicine | Admitting: Internal Medicine

## 2018-08-06 ENCOUNTER — Encounter (HOSPITAL_COMMUNITY): Payer: Self-pay

## 2018-08-06 DIAGNOSIS — Z79899 Other long term (current) drug therapy: Secondary | ICD-10-CM | POA: Diagnosis not present

## 2018-08-06 DIAGNOSIS — J449 Chronic obstructive pulmonary disease, unspecified: Secondary | ICD-10-CM | POA: Insufficient documentation

## 2018-08-06 DIAGNOSIS — F419 Anxiety disorder, unspecified: Secondary | ICD-10-CM | POA: Insufficient documentation

## 2018-08-06 DIAGNOSIS — F17210 Nicotine dependence, cigarettes, uncomplicated: Secondary | ICD-10-CM | POA: Insufficient documentation

## 2018-08-06 DIAGNOSIS — R000 Tachycardia, unspecified: Secondary | ICD-10-CM | POA: Diagnosis not present

## 2018-08-06 DIAGNOSIS — J910 Malignant pleural effusion: Principal | ICD-10-CM | POA: Insufficient documentation

## 2018-08-06 DIAGNOSIS — Z9012 Acquired absence of left breast and nipple: Secondary | ICD-10-CM | POA: Diagnosis not present

## 2018-08-06 DIAGNOSIS — Z7982 Long term (current) use of aspirin: Secondary | ICD-10-CM | POA: Insufficient documentation

## 2018-08-06 DIAGNOSIS — Z853 Personal history of malignant neoplasm of breast: Secondary | ICD-10-CM | POA: Insufficient documentation

## 2018-08-06 DIAGNOSIS — I2699 Other pulmonary embolism without acute cor pulmonale: Secondary | ICD-10-CM | POA: Diagnosis present

## 2018-08-06 DIAGNOSIS — D61810 Antineoplastic chemotherapy induced pancytopenia: Secondary | ICD-10-CM | POA: Insufficient documentation

## 2018-08-06 DIAGNOSIS — Z66 Do not resuscitate: Secondary | ICD-10-CM | POA: Insufficient documentation

## 2018-08-06 DIAGNOSIS — I10 Essential (primary) hypertension: Secondary | ICD-10-CM | POA: Diagnosis not present

## 2018-08-06 DIAGNOSIS — F329 Major depressive disorder, single episode, unspecified: Secondary | ICD-10-CM | POA: Insufficient documentation

## 2018-08-06 DIAGNOSIS — Z20828 Contact with and (suspected) exposure to other viral communicable diseases: Secondary | ICD-10-CM | POA: Insufficient documentation

## 2018-08-06 DIAGNOSIS — Z86711 Personal history of pulmonary embolism: Secondary | ICD-10-CM | POA: Insufficient documentation

## 2018-08-06 DIAGNOSIS — C3490 Malignant neoplasm of unspecified part of unspecified bronchus or lung: Secondary | ICD-10-CM | POA: Diagnosis not present

## 2018-08-06 DIAGNOSIS — J90 Pleural effusion, not elsewhere classified: Secondary | ICD-10-CM | POA: Diagnosis not present

## 2018-08-06 DIAGNOSIS — C50919 Malignant neoplasm of unspecified site of unspecified female breast: Secondary | ICD-10-CM | POA: Diagnosis present

## 2018-08-06 DIAGNOSIS — Z9221 Personal history of antineoplastic chemotherapy: Secondary | ICD-10-CM | POA: Insufficient documentation

## 2018-08-06 DIAGNOSIS — R0602 Shortness of breath: Secondary | ICD-10-CM | POA: Diagnosis present

## 2018-08-06 DIAGNOSIS — E876 Hypokalemia: Secondary | ICD-10-CM | POA: Insufficient documentation

## 2018-08-06 LAB — CBC WITH DIFFERENTIAL/PLATELET
Abs Immature Granulocytes: 0.09 10*3/uL — ABNORMAL HIGH (ref 0.00–0.07)
Basophils Absolute: 0 10*3/uL (ref 0.0–0.1)
Basophils Relative: 0 %
Eosinophils Absolute: 0 10*3/uL (ref 0.0–0.5)
Eosinophils Relative: 0 %
HCT: 30 % — ABNORMAL LOW (ref 36.0–46.0)
Hemoglobin: 8.5 g/dL — ABNORMAL LOW (ref 12.0–15.0)
Immature Granulocytes: 1 %
Lymphocytes Relative: 7 %
Lymphs Abs: 0.8 10*3/uL (ref 0.7–4.0)
MCH: 26.6 pg (ref 26.0–34.0)
MCHC: 28.3 g/dL — ABNORMAL LOW (ref 30.0–36.0)
MCV: 93.8 fL (ref 80.0–100.0)
Monocytes Absolute: 1.2 10*3/uL — ABNORMAL HIGH (ref 0.1–1.0)
Monocytes Relative: 11 %
Neutro Abs: 9.1 10*3/uL — ABNORMAL HIGH (ref 1.7–7.7)
Neutrophils Relative %: 81 %
Platelets: 416 10*3/uL — ABNORMAL HIGH (ref 150–400)
RBC: 3.2 MIL/uL — ABNORMAL LOW (ref 3.87–5.11)
RDW: 17.2 % — ABNORMAL HIGH (ref 11.5–15.5)
WBC: 11.2 10*3/uL — ABNORMAL HIGH (ref 4.0–10.5)
nRBC: 0 % (ref 0.0–0.2)

## 2018-08-06 LAB — HEPARIN LEVEL (UNFRACTIONATED): Heparin Unfractionated: 0.98 IU/mL — ABNORMAL HIGH (ref 0.30–0.70)

## 2018-08-06 LAB — BASIC METABOLIC PANEL
Anion gap: 10 (ref 5–15)
BUN: 9 mg/dL (ref 8–23)
CO2: 31 mmol/L (ref 22–32)
Calcium: 8.5 mg/dL — ABNORMAL LOW (ref 8.9–10.3)
Chloride: 102 mmol/L (ref 98–111)
Creatinine, Ser: 0.59 mg/dL (ref 0.44–1.00)
GFR calc Af Amer: >60 mL/min (ref >60)
GFR calc non Af Amer: >60 mL/min (ref >60)
Glucose, Bld: 90 mg/dL (ref 70–99)
Potassium: 3.7 mmol/L (ref 3.5–5.1)
Sodium: 143 mmol/L (ref 135–145)

## 2018-08-06 LAB — SARS CORONAVIRUS 2 BY RT PCR (HOSPITAL ORDER, PERFORMED IN ~~LOC~~ HOSPITAL LAB): SARS Coronavirus 2: NEGATIVE

## 2018-08-06 LAB — APTT: aPTT: 43 seconds — ABNORMAL HIGH (ref 24–36)

## 2018-08-06 MED ORDER — SODIUM CHLORIDE 0.9% FLUSH
3.0000 mL | Freq: Two times a day (BID) | INTRAVENOUS | Status: DC
  Administered 2018-08-06 – 2018-08-07 (×2): 3 mL via INTRAVENOUS

## 2018-08-06 MED ORDER — ACETAMINOPHEN 650 MG RE SUPP: 650.0000 mg | Freq: Four times a day (QID) | RECTAL | Status: DC | PRN

## 2018-08-06 MED ORDER — NICOTINE 21 MG/24HR TD PT24
21.0000 mg | MEDICATED_PATCH | TRANSDERMAL | Status: DC
  Filled 2018-08-06: qty 1

## 2018-08-06 MED ORDER — ACETAMINOPHEN 325 MG PO TABS: 650.0000 mg | ORAL_TABLET | Freq: Four times a day (QID) | ORAL | Status: DC | PRN

## 2018-08-06 MED ORDER — ADULT MULTIVITAMIN W/MINERALS CH
1.0000 | ORAL_TABLET | Freq: Every day | ORAL | Status: DC
  Administered 2018-08-06 – 2018-08-07 (×2): 1 via ORAL
  Filled 2018-08-06 (×2): qty 1

## 2018-08-06 MED ORDER — SODIUM CHLORIDE 0.9 % IV SOLN: 250.0000 mL | INTRAVENOUS | Status: DC | PRN

## 2018-08-06 MED ORDER — PAROXETINE HCL 20 MG PO TABS
20.0000 mg | ORAL_TABLET | Freq: Two times a day (BID) | ORAL | Status: DC
  Administered 2018-08-06 – 2018-08-07 (×3): 20 mg via ORAL
  Filled 2018-08-06 (×3): qty 1

## 2018-08-06 MED ORDER — LIDOCAINE HCL 1 % IJ SOLN
INTRAMUSCULAR | Status: AC
  Filled 2018-08-06: qty 10

## 2018-08-06 MED ORDER — PROCHLORPERAZINE MALEATE 10 MG PO TABS: 10.0000 mg | ORAL_TABLET | Freq: Four times a day (QID) | ORAL | Status: DC | PRN

## 2018-08-06 MED ORDER — HEPARIN (PORCINE) 25000 UT/250ML-% IV SOLN
1200.0000 [IU]/h | INTRAVENOUS | Status: DC
  Administered 2018-08-06: 1200 [IU]/h via INTRAVENOUS
  Filled 2018-08-06 (×2): qty 250

## 2018-08-06 MED ORDER — POTASSIUM CHLORIDE ER 10 MEQ PO TBCR
20.0000 meq | EXTENDED_RELEASE_TABLET | Freq: Every day | ORAL | Status: DC
  Administered 2018-08-06 – 2018-08-07 (×2): 20 meq via ORAL
  Filled 2018-08-06 (×4): qty 2

## 2018-08-06 MED ORDER — OXYCODONE-ACETAMINOPHEN 5-325 MG PO TABS
1.0000 | ORAL_TABLET | ORAL | Status: DC | PRN
  Administered 2018-08-06 – 2018-08-07 (×2): 1 via ORAL
  Filled 2018-08-06 (×2): qty 1

## 2018-08-06 MED ORDER — ENOXAPARIN SODIUM 40 MG/0.4ML ~~LOC~~ SOLN: 40.0000 mg | SUBCUTANEOUS | Status: DC

## 2018-08-06 MED ORDER — SODIUM CHLORIDE 0.9% FLUSH: 3.0000 mL | INTRAVENOUS | Status: DC | PRN

## 2018-08-06 MED ORDER — GABAPENTIN 400 MG PO CAPS
400.0000 mg | ORAL_CAPSULE | Freq: Three times a day (TID) | ORAL | Status: DC
  Administered 2018-08-06 – 2018-08-07 (×3): 400 mg via ORAL
  Filled 2018-08-06 (×3): qty 1

## 2018-08-06 MED ORDER — APIXABAN 5 MG PO TABS: 5.0000 mg | ORAL_TABLET | Freq: Two times a day (BID) | ORAL | Status: DC

## 2018-08-06 MED ORDER — METOPROLOL TARTRATE 12.5 MG HALF TABLET
12.5000 mg | ORAL_TABLET | Freq: Two times a day (BID) | ORAL | Status: DC
  Administered 2018-08-06 – 2018-08-07 (×3): 12.5 mg via ORAL
  Filled 2018-08-06 (×3): qty 1

## 2018-08-06 MED ORDER — SODIUM CHLORIDE 0.9% FLUSH
10.0000 mL | INTRAVENOUS | Status: DC | PRN
  Administered 2018-08-06: 20:00:00 20 mL
  Administered 2018-08-07: 10 mL
  Filled 2018-08-06: qty 40

## 2018-08-06 MED ORDER — DRONABINOL 2.5 MG PO CAPS
2.5000 mg | ORAL_CAPSULE | Freq: Two times a day (BID) | ORAL | Status: DC
  Administered 2018-08-06 – 2018-08-07 (×2): 2.5 mg via ORAL
  Filled 2018-08-06 (×2): qty 1

## 2018-08-06 MED ORDER — ALBUTEROL SULFATE (2.5 MG/3ML) 0.083% IN NEBU: 2.5000 mg | INHALATION_SOLUTION | Freq: Four times a day (QID) | RESPIRATORY_TRACT | Status: DC | PRN

## 2018-08-06 NOTE — ED Notes (Signed)
Attempted to call report several times starting at 0630 no one able to take at this time.

## 2018-08-06 NOTE — Progress Notes (Signed)
  PROGRESS NOTE  Patient admitted earlier this morning. See H&P. Deborah Mcintyre is a 63 year old female with past medical history significant for COPD, history of metastatic breast cancer with malignant recurrent left pleural effusion, recently diagnosed PE on Eliquis who presents to the hospital with complaints of shortness of breath, left-sided chest pain.  She was recently admitted to the hospital July 22 to July 30.  At that time, her Pleurx drain was removed as it was no longer functioning.  Patient underwent thoracentesis this morning, only was able to remove 300 cc due to loculation.  Patient states that she feels mildly better after thoracentesis, but continues to have left-sided chest pain and shortness of breath at rest.  I discussed with cardiothoracic surgery on-call to see if there was anything that they could offer patient with recurrent malignant pleural effusion.  Sometimes, they are able to offer VATS and decortication, although this surgery would not prolong patient's life and would be symptom management only.  However, with patient's recent diagnosis of PE and other comorbidities, patient is not a surgical candidate.  I had a frank discussion with patient today.  I discussed with her that she has recurrent malignant pleural effusion that is causing shortness of breath and chest pain.  I discussed with her that Pleurx drain is likely not an option for her any longer.  She is certainly not a surgical candidate.  Dr. Lindi Adie had previously recommended hospice for patient.  She had met with palliative care in the past for goals of care conversation.  I believe that patient is hospice appropriate.  She seems very detached during our conversation, although states that she understood everything I was telling her.  I will consult palliative care again.   Dessa Phi, DO [facility] [email or web address] 08/06/2018, 12:43 PM

## 2018-08-06 NOTE — ED Notes (Signed)
Per Dr. Florina Ou, NRB discontinued-placed on O2 6l/[place]-will monitor O2 sat-instructed patient to call immediately for any increased shortness of breath-call bed with patient-continuous monitor and pulse ox

## 2018-08-06 NOTE — H&P (Addendum)
TRH H&P    Patient Demographics:    Deborah Mcintyre, is a 63 y.o. female  MRN: [id number]  DOB - 1955/03/27  Admit Date - 08/06/2018  Referring MD/NP/PA: Shanon Rosser  Outpatient Primary MD for the patient is [name], MD  Patient coming from:  home     Chief complaint-  dyspnea   HPI:    Deborah Mcintyre  is a 63 y.o. female,  w anxiety/depression, osteoarthritis, hypertension, Copd, bronchitis, Hcap, Afib ? (w EMS on prior admission), , h/o breast cancer w malignant left pleural effusion apparently with recent admission for chest tube not draining on 07/27/2018, presents today to c/o dyspnea as well as left sided chest discomfort.  Pt denies fever, chills, palp, n/v, abd pain, diarrhea, brbpr, black stool.   Pt had chest tube removed during this most recent admission from 7/22-7/30/2020.   In ED,   T 97.7  P 106  R 18 Bp 101/60  pxo 100%  CXR IMPRESSION: Enlarging large left pleural effusion with diffuse left lung airspace disease.  Small right pleural effusion with nodular densities in the right mid lung as seen on prior chest CT.  Wbc 11.2, Hgb 8.5, Plt 416 Na 143, K 3.7, Bun 9, Creatinine 0.59  Pt will be admitted for dyspnea secondary to Left pleural effusion.      Review of systems:    In addition to the HPI above,  No Fever-chills, No Headache, No changes with Vision or hearing, No problems swallowing food or Liquids, No Cough  No Abdominal pain, No Nausea or Vomiting, bowel movements are regular, No Blood in stool or Urine, No dysuria, No new skin rashes or bruises, No new joints pains-aches,  No new weakness, tingling, numbness in any extremity, No recent weight gain or loss, No polyuria, polydypsia or polyphagia, No significant Mental Stressors.  All other systems reviewed and are negative.    Past History of the following :    Past Medical History:  Diagnosis  Date  . Anxiety   . Arthritis   . Bronchitis   . COPD (chronic obstructive pulmonary disease) ([place])   . Depression   . Hypertension   . Malignant neoplasm of upper-outer quadrant of left female breast ([place]) 01/04/2015  . Nocturia   . Numbness and tingling    toes - bilateral  . Pneumonia   . PONV (postoperative nausea and vomiting)    Nausea      Past Surgical History:  Procedure Laterality Date  . BREAST LUMPECTOMY Bilateral    x3  . BREAST LUMPECTOMY WITH RADIOACTIVE SEED LOCALIZATION Right 08/23/2015   Procedure: RIGHT BREAST LUMPECTOMY WITH RADIOACTIVE SEED LOCALIZATION;  Surgeon: Alphonsa Overall, MD;  Location: [facility];  Service: General;  Laterality: Right;  . CESAREAN SECTION     x4  . IR GUIDED DRAIN W CATHETER PLACEMENT  06/02/2018  . IR IMAGING GUIDED PORT INSERTION  05/25/2018  . IR REMOVAL OF PLURAL CATH W/CUFF  07/28/2018  . IR REMOVAL TUN ACCESS W/ PORT W/O FL MOD SED  10/20/2016  .  IR THORACENTESIS ASP PLEURAL SPACE W/IMG GUIDE  05/12/2018  . IR THORACENTESIS ASP PLEURAL SPACE W/IMG GUIDE  07/07/2018  . MASTECTOMY Left 08/23/2015    RIGHT BREAST LUMPECTOMY WITH RADIOACTIVE SEED LOCALIZATION,  LEFT MASTECTOMY WITH AXILLARY LYMPH NODE DISSECTION  . MASTECTOMY WITH AXILLARY LYMPH NODE DISSECTION Left 08/23/2015   Procedure: LEFT MASTECTOMY WITH AXILLARY LYMPH NODE DISSECTION;  Surgeon: Alphonsa Overall, MD;  Location: [facility];  Service: General;  Laterality: Left;  [name] MULTIPLE TOOTH EXTRACTIONS        Social History:      Social History   Tobacco Use  . Smoking status: Current Every Day Smoker    Packs/day: 0.75    Years: 45.00    Pack years: 33.75    Types: Cigarettes  . Smokeless tobacco: Never Used  Substance Use Topics  . Alcohol use: Yes    Comment: ocassionally       Family History :     Family History  Problem Relation Age of Onset  . Breast cancer Maternal Aunt        Home Medications:   Prior to Admission medications   Medication Sig Start Date End  Date Taking? Authorizing Provider  albuterol (PROVENTIL HFA;VENTOLIN HFA) 108 (90 Base) MCG/ACT inhaler Inhale 2 puffs into the lungs every 6 (six) hours as needed for wheezing or shortness of breath.    Yes [provider]  albuterol (PROVENTIL) (2.5 MG/3ML) 0.083% nebulizer solution Take 3 mLs (2.5 mg total) by nebulization every 6 (six) hours as needed for up to 30 days for wheezing or shortness of breath. 07/02/18 08/06/18 Yes Alma Friendly, MD  apixaban (ELIQUIS) 5 MG TABS tablet Take 1 tablet (5 mg total) by mouth 2 (two) times daily. 08/02/18 10/01/18 Yes Kyle, Tyrone A, DO  aspirin EC 81 MG tablet Take 81 mg by mouth daily.   Yes [provider]  dronabinol (MARINOL) 2.5 MG capsule Take 1 capsule (2.5 mg total) by mouth 2 (two) times daily before a meal. 07/21/18  Yes Causey, Charlestine Massed, NP  gabapentin (NEURONTIN) 400 MG capsule Take 1 capsule (400 mg total) by mouth 3 (three) times daily. 07/17/18  Yes Kayleen Memos, DO  lidocaine-prilocaine (EMLA) cream Apply to affected area once Patient taking differently: Apply 1 application topically daily as needed (port access).  05/19/18  Yes Nicholas Lose, MD  metoprolol tartrate (LOPRESSOR) 25 MG tablet Take 0.5 tablets (12.5 mg total) by mouth 2 (two) times daily. 08/04/18 09/03/18 Yes Donne Hazel, MD  Multiple Vitamin (MULTIVITAMIN WITH MINERALS) TABS tablet Take 1 tablet by mouth daily. 07/18/18  Yes Hall, Carole N, DO  nicotine (NICODERM CQ - DOSED IN MG/24 HOURS) 21 mg/24hr patch Place 1 patch (21 mg total) onto the skin daily. 08/02/18  Yes Kyle, Tyrone A, DO  ondansetron (ZOFRAN) 8 MG tablet Take 1 tablet (8 mg total) by mouth every 8 (eight) hours as needed for nausea. 09/23/17  Yes Nicholas Lose, MD  oxyCODONE-acetaminophen (PERCOCET/ROXICET) 5-325 MG tablet Take 1 tablet by mouth every 4 (four) hours as needed for severe pain.   Yes [provider]  PARoxetine (PAXIL) 20 MG tablet Take 1 tablet (20 mg total)  by mouth 2 (two) times daily. 08/02/18 10/01/18 Yes Kyle, Tyrone A, DO  potassium chloride (K-DUR) 10 MEQ tablet Take 2 tablets (20 mEq total) by mouth daily. 07/21/18  Yes Causey, Charlestine Massed, NP  PREMARIN vaginal cream INSERT 1 APPLICATORFUL VAGINALLY DAILY Patient taking differently: Place 1 Applicatorful  vaginally daily as needed (dryness).  04/19/17  Yes Nicholas Lose, MD  prochlorperazine (COMPAZINE) 10 MG tablet Take 1 tablet (10 mg total) by mouth every 6 (six) hours as needed (Nausea or vomiting). Patient taking differently: Take 10 mg by mouth every 6 (six) hours as needed for nausea or vomiting.  05/19/18  Yes Nicholas Lose, MD  promethazine (PHENERGAN) 12.5 MG tablet Take 1 tablet (12.5 mg total) by mouth every 6 (six) hours as needed for nausea or vomiting. 07/19/18  Yes Nicholas Lose, MD     Allergies:     Allergies  Allergen Reactions  . Fentanyl Nausea And Vomiting and Other (See Comments)    Headache  . Morphine And Related Nausea And Vomiting and Other (See Comments)    Headache     Physical Exam:   Vitals  Blood pressure 112/69, pulse (!) 107, temperature 97.7 F (36.5 C), temperature source Oral, resp. rate (!) 22, height 5\' 7"  (1.702 m), weight 74.4 kg, SpO2 100 %.  1.  General: axoxo3  2. Psychiatric: euthymic  3. Neurologic: cn2-12 intact, reflexes 2+ symmetric, diffuse with no clonus, motor 5/5 in all 4ext  4. HEENMT:  Anicteric, pupils 1.77mm symmetric, direct, consensual, near intact  5. Respiratory : Left lung decrease in bs, about 1/2 up,  No crackles, no wheezing  6. Cardiovascular : tachy s1, s2, no m/g/r  7. Gastrointestinal:  Abd: soft, nt, nd, +bs  8. Skin:  Ext: no c/c/e  9.Musculoskeletal:  Good ROM,      Data Review:    CBC Recent Labs  Lab 08/01/18 0449 08/01/18 2246 08/02/18 0523 08/03/18 0348 08/04/18 0441 08/06/18 0240  WBC 1.8*  --  3.0* 4.8 6.9 11.2*  HGB 7.0* 8.4* 8.5* 8.3* 8.4* 8.5*  HCT 25.7* 29.0* 29.7*  28.9* 30.0* 30.0*  PLT 396  --  381 366 397 416*  MCV 95.9  --  94.3 94.1 94.3 93.8  MCH 26.1  --  27.0 27.0 26.4 26.6  MCHC 27.2*  --  28.6* 28.7* 28.0* 28.3*  RDW 16.4*  --  16.3* 16.4* 16.5* 17.2*  LYMPHSABS 0.7  --  0.6* 0.7 0.7 0.8  MONOABS 0.5  --  1.0 1.3* 1.3* 1.2*  EOSABS 0.1  --  0.0 0.0 0.0 0.0  BASOSABS 0.0  --  0.0 0.0 0.0 0.0   ------------------------------------------------------------------------------------------------------------------  Results for orders placed or performed during the hospital encounter of 08/06/18 (from the past 48 hour(s))  CBC with Differential/Platelet     Status: Abnormal   Collection Time: 08/06/18  2:40 AM  Result Value Ref Range   WBC 11.2 (H) 4.0 - 10.5 K/uL   RBC 3.20 (L) 3.87 - 5.11 MIL/uL   Hemoglobin 8.5 (L) 12.0 - 15.0 g/dL   HCT 30.0 (L) 36.0 - 46.0 %   MCV 93.8 80.0 - 100.0 fL   MCH 26.6 26.0 - 34.0 pg   MCHC 28.3 (L) 30.0 - 36.0 g/dL   RDW 17.2 (H) 11.5 - 15.5 %   Platelets 416 (H) 150 - 400 K/uL   nRBC 0.0 0.0 - 0.2 %   Neutrophils Relative % 81 %   Neutro Abs 9.1 (H) 1.7 - 7.7 K/uL   Lymphocytes Relative 7 %   Lymphs Abs 0.8 0.7 - 4.0 K/uL   Monocytes Relative 11 %   Monocytes Absolute 1.2 (H) 0.1 - 1.0 K/uL   Eosinophils Relative 0 %   Eosinophils Absolute 0.0 0.0 - 0.5 K/uL   Basophils Relative 0 %  Basophils Absolute 0.0 0.0 - 0.1 K/uL   Immature Granulocytes 1 %   Abs Immature Granulocytes 0.09 (H) 0.00 - 0.07 K/uL    Comment: Performed at [facility], [place] [address], [place], [place] [address]  Basic metabolic panel     Status: Abnormal   Collection Time: 08/06/18  2:40 AM  Result Value Ref Range   Sodium 143 135 - 145 mmol/L   Potassium 3.7 3.5 - 5.1 mmol/L   Chloride 102 98 - 111 mmol/L   CO2 31 22 - 32 mmol/L   Glucose, Bld 90 70 - 99 mg/dL   BUN 9 8 - 23 mg/dL   Creatinine, Ser 0.59 0.44 - 1.00 mg/dL   Calcium 8.5 (L) 8.9 - 10.3 mg/dL   GFR calc non Af Amer >60 >60 mL/min   GFR  calc Af Amer >60 >60 mL/min   Anion gap 10 5 - 15    Comment: Performed at [facility], [address] [place], [place] [address]    Chemistries  Recent Labs  Lab 07/31/18 0500 07/31/18 0502 08/01/18 0449 08/02/18 0523 08/06/18 0240  NA 140  --  140 140 143  K 3.8  --  4.1 3.8 3.7  CL 104  --  99 100 102  CO2 27  --  32 32 31  GLUCOSE 94  --  105* 113* 90  BUN 8  --  8 9 9   CREATININE 0.52  --  0.60 0.55 0.59  CALCIUM 8.1*  --  8.3* 8.3* 8.5*  MG  --  1.7 1.8 1.9  --    ------------------------------------------------------------------------------------------------------------------  ------------------------------------------------------------------------------------------------------------------ GFR: Estimated Creatinine Clearance: 75.8 mL/min (by C-G formula based on SCr of 0.59 mg/dL). Liver Function Tests: Recent Labs  Lab 07/31/18 0500 08/01/18 0449 08/02/18 0523  ALBUMIN 2.2* 2.3* 2.4*   No results for input(s): LIPASE, AMYLASE in the last 168 hours. No results for input(s): AMMONIA in the last 168 hours. Coagulation Profile: No results for input(s): INR, PROTIME in the last 168 hours. Cardiac Enzymes: No results for input(s): CKTOTAL, CKMB, CKMBINDEX, TROPONINI in the last 168 hours. BNP (last 3 results) No results for input(s): PROBNP in the last 8760 hours. HbA1C: No results for input(s): HGBA1C in the last 72 hours. CBG: No results for input(s): GLUCAP in the last 168 hours. Lipid Profile: No results for input(s): CHOL, HDL, LDLCALC, TRIG, CHOLHDL, LDLDIRECT in the last 72 hours. Thyroid Function Tests: No results for input(s): TSH, T4TOTAL, FREET4, T3FREE, THYROIDAB in the last 72 hours. Anemia Panel: No results for input(s): VITAMINB12, FOLATE, FERRITIN, TIBC, IRON, RETICCTPCT in the last 72 hours.  --------------------------------------------------------------------------------------------------------------- Urine analysis:     Component Value Date/Time   COLORURINE YELLOW 12/06/2014 2207   APPEARANCEUR CLOUDY (A) 12/06/2014 2207   LABSPEC 1.025 12/06/2014 2207   PHURINE 5.5 12/06/2014 2207   GLUCOSEU NEGATIVE 12/06/2014 2207   HGBUR MODERATE (A) 12/06/2014 2207   BILIRUBINUR NEGATIVE 12/06/2014 2207   KETONESUR NEGATIVE 12/06/2014 2207   PROTEINUR NEGATIVE 12/06/2014 2207   UROBILINOGEN 1.0 07/30/2013 1527   NITRITE POSITIVE (A) 12/06/2014 2207   LEUKOCYTESUR SMALL (A) 12/06/2014 2207      Imaging Results:    Dg Chest 2 View  Result Date: 08/06/2018 CLINICAL DATA:  Shortness of Breath EXAM: CHEST - 2 VIEW COMPARISON:  07/27/2018 FINDINGS: Large left pleural effusion, increasing since prior study. Diffuse airspace disease throughout the aerated left lung. Small right pleural effusion. Nodular densities noted in the right  lung, likely corresponding to the peripheral density seen on prior chest CT. Heart is normal size. Right Port-A-Cath remains in place with the tip in the SVC. IMPRESSION: Enlarging large left pleural effusion with diffuse left lung airspace disease. Small right pleural effusion with nodular densities in the right mid lung as seen on prior chest CT. Electronically Signed   By: Rolm Baptise M.D.   On: 08/06/2018 03:12    st at 105, nl axis, nl int, RBBB   Assessment & Plan:    Principal Problem:   Pleural effusion Active Problems:   Hypertension   COPD (chronic obstructive pulmonary disease) (HCC)   Anxiety and depression  Dyspnea secondary to left pleural effusion Please contact CT surgery this am to see if pleurex catheter would be of benefit vs u/s guided thoracentesis NPO for now except for medication  Tachycardia Tele Trop I q2h x2  Check D dimer, if positive then CTA chest r/o PE  H/o pulmonary embolism Cont Eliquis  Antineoplastic chemotherapy induced pancytopenia (HCC) Mild leukocytosis (monitor) Check cbc in am  Hypertension Cont Amlodipine Cont Metoprolol   COPD Continue supplemental oxygen. Albuterol neb prn  Anxiety and depression Continue paroxetine  Tobacco abuse Nicotine replacement therapy as needed.  Chest pain Cont Gabapentin Percocet prn   H/o hypokalemia Cont potassium Check cmp in am  DVT Prophylaxis-   Eliquis, SCD  AM Labs Ordered, also please review Full Orders  Family Communication: Admission, patients condition and plan of care including tests being ordered have been discussed with the patient  who indicate understanding and agree with the plan and Code Status.  Code Status:  FULL CODE<   Admission status: Observation: Based on patients clinical presentation and evaluation of above clinical data, I have made determination that patient meets observation criteria at this time.    Time spent in minutes :  70    Jani Gravel M.D on 08/06/2018 at 6:30 AM

## 2018-08-06 NOTE — Progress Notes (Signed)
ANTICOAGULATION CONSULT NOTE - Initial Consult  Pharmacy Consult for Apixaban Indication: pulmonary embolus  Allergies  Allergen Reactions  . Fentanyl Nausea And Vomiting and Other (See Comments)    Headache  . Morphine And Related Nausea And Vomiting and Other (See Comments)    Headache    Patient Measurements: Height: 5\' 7"  (170.2 cm) Weight: 164 lb (74.4 kg) IBW/kg (Calculated) : 61.6   Vital Signs: Temp: 97.7 F (36.5 C) (08/01 0158) Temp Source: Oral (08/01 0158) BP: 128/83 (08/01 0700) Pulse Rate: 108 (08/01 0700)  Labs: Recent Labs    08/04/18 0441 08/06/18 0240  HGB 8.4* 8.5*  HCT 30.0* 30.0*  PLT 397 416*  CREATININE  --  0.59    Estimated Creatinine Clearance: 75.8 mL/min (by C-G formula based on SCr of 0.59 mg/dL).   Medical History: Past Medical History:  Diagnosis Date  . Anxiety   . Arthritis   . Bronchitis   . COPD (chronic obstructive pulmonary disease) ([place])   . Depression   . Hypertension   . Malignant neoplasm of upper-outer quadrant of left female breast ([place]) 01/04/2015  . Nocturia   . Numbness and tingling    toes - bilateral  . Pneumonia   . PONV (postoperative nausea and vomiting)    Nausea    Medications:  Scheduled:  . apixaban  5 mg Oral BID  . sodium chloride flush  3 mL Intravenous Q12H   Infusions:  . sodium chloride      Assessment: 29 yoF admitted with dyspnea on PTA apixaban for hx of PE.     Plan:  Apixaban 5 mg bid as on prior to admission Rx will sign off of consult.   Lawana Pai R 08/06/2018,7:31 AM

## 2018-08-06 NOTE — ED Triage Notes (Signed)
Pt from home c/o SOB onset 0030 tonight slight wheezing

## 2018-08-06 NOTE — Progress Notes (Signed)
ANTICOAGULATION CONSULT NOTE - Initial Consult  Pharmacy Consult for heparin Indication: pulmonary embolus - bridge therapy while apixaban on hold  Allergies  Allergen Reactions  . Fentanyl Nausea And Vomiting and Other (See Comments)    Headache  . Morphine And Related Nausea And Vomiting and Other (See Comments)    Headache    Patient Measurements: Height: 5\' 7"  (170.2 cm) Weight: 160 lb 4.4 oz (72.7 kg) IBW/kg (Calculated) : 61.6 Heparin Dosing Weight: 72.7 kg  Vital Signs: Temp: 98.9 F (37.2 C) (08/01 1225) Temp Source: Oral (08/01 1225) BP: 130/81 (08/01 1225) Pulse Rate: 108 (08/01 1225)  Labs: Recent Labs    08/04/18 0441 08/06/18 0240  HGB 8.4* 8.5*  HCT 30.0* 30.0*  PLT 397 416*  CREATININE  --  0.59    Estimated Creatinine Clearance: 70 mL/min (by C-G formula based on SCr of 0.59 mg/dL).   Medical History: Past Medical History:  Diagnosis Date  . Anxiety   . Arthritis   . Bronchitis   . COPD (chronic obstructive pulmonary disease) ([place])   . Depression   . Hypertension   . Malignant neoplasm of upper-outer quadrant of left female breast ([place]) 01/04/2015  . Nocturia   . Numbness and tingling    toes - bilateral  . Pneumonia   . PONV (postoperative nausea and vomiting)    Nausea    Medications:  Medications Prior to Admission  Medication Sig Dispense Refill Last Dose  . albuterol (PROVENTIL HFA;VENTOLIN HFA) 108 (90 Base) MCG/ACT inhaler Inhale 2 puffs into the lungs every 6 (six) hours as needed for wheezing or shortness of breath.    08/04/2018 at Unknown time  . albuterol (PROVENTIL) (2.5 MG/3ML) 0.083% nebulizer solution Take 3 mLs (2.5 mg total) by nebulization every 6 (six) hours as needed for up to 30 days for wheezing or shortness of breath. 75 mL 0 08/04/2018 at Unknown time  . apixaban (ELIQUIS) 5 MG TABS tablet Take 1 tablet (5 mg total) by mouth 2 (two) times daily. 60 tablet 1 08/05/2018 at 9pm  . aspirin EC 81 MG tablet Take 81 mg by  mouth daily.   08/05/2018 at 9pm  . dronabinol (MARINOL) 2.5 MG capsule Take 1 capsule (2.5 mg total) by mouth 2 (two) times daily before a meal. 60 capsule 0 08/04/2018 at Unknown time  . gabapentin (NEURONTIN) 400 MG capsule Take 1 capsule (400 mg total) by mouth 3 (three) times daily. 30 capsule 0 08/05/2018 at Unknown time  . lidocaine-prilocaine (EMLA) cream Apply to affected area once (Patient taking differently: Apply 1 application topically daily as needed (port access). ) 30 g 3 Past Week at Unknown time  . metoprolol tartrate (LOPRESSOR) 25 MG tablet Take 0.5 tablets (12.5 mg total) by mouth 2 (two) times daily. 30 tablet 0 08/05/2018 at 8am  . Multiple Vitamin (MULTIVITAMIN WITH MINERALS) TABS tablet Take 1 tablet by mouth daily. 30 tablet 0 08/05/2018 at Unknown time  . nicotine (NICODERM CQ - DOSED IN MG/24 HOURS) 21 mg/24hr patch Place 1 patch (21 mg total) onto the skin daily. 28 patch 0 08/04/2018 at Unknown time  . ondansetron (ZOFRAN) 8 MG tablet Take 1 tablet (8 mg total) by mouth every 8 (eight) hours as needed for nausea. 30 tablet 3 08/05/2018 at Unknown time  . oxyCODONE-acetaminophen (PERCOCET/ROXICET) 5-325 MG tablet Take 1 tablet by mouth every 4 (four) hours as needed for severe pain.   08/05/2018 at Unknown time  . PARoxetine (PAXIL) 20 MG tablet  Take 1 tablet (20 mg total) by mouth 2 (two) times daily. 60 tablet 1 08/05/2018 at Unknown time  . potassium chloride (K-DUR) 10 MEQ tablet Take 2 tablets (20 mEq total) by mouth daily. 10 tablet 0 08/05/2018 at Unknown time  . PREMARIN vaginal cream INSERT 1 APPLICATORFUL VAGINALLY DAILY (Patient taking differently: Place 1 Applicatorful vaginally daily as needed (dryness). ) 30 g 12 Past Week at Unknown time  . prochlorperazine (COMPAZINE) 10 MG tablet Take 1 tablet (10 mg total) by mouth every 6 (six) hours as needed (Nausea or vomiting). (Patient taking differently: Take 10 mg by mouth every 6 (six) hours as needed for nausea or vomiting.  ) 30 tablet 1 Past Week at Unknown time  . promethazine (PHENERGAN) 12.5 MG tablet Take 1 tablet (12.5 mg total) by mouth every 6 (six) hours as needed for nausea or vomiting. 90 tablet 0 Past Week at Unknown time    Assessment: 63 yo F on apixaban PTA for PE.  Pharmacy consulted to dose heparin for bridge therapy while apixaban on hold for possible invasive procedure. PTA apixaban 5 mg po BID, last dose 7/31 at 2100.  Hg 8.5 - stable. PLTC sl elevateda t 416.  No bleeding reported.  Scr 0.59. CrCl ~ 70 ml/min.  S/p thoracentesis today. IR to re-eval on Monday for possible pleurex catheter (unlikely).   Goal of Therapy:  Heparin level 0.3-0.7 units/ml aPTT 66-120 seconds Monitor platelets by anticoagulation protocol: Yes   Plan:  Draw baseline heparin level and aPTT to determine level of interference of PTA apixaban on heparin levels no bolus 2nd thoracentesis today Start heparin at 1200 units/hr after baseline labs drawn and check 6 hr HL   Eudelia Bunch, Pharm.D [phone] 08/06/2018 4:49 PM

## 2018-08-06 NOTE — Procedures (Signed)
Lung ca, SOB, left eff  S/p [place] LEFT THORA  Only 300 cc could be removed eff is now loculated with multiple septations and is NOT going to drain well anymore  Full report in pacs

## 2018-08-06 NOTE — ED Provider Notes (Signed)
[facility] DEPT [facility] Provider Note: Georgena Spurling, MD, FACEP  CSN: [id number] MRN: [id number] ARRIVAL: 08/06/18 at [place]: 1511/1511-01   CHIEF COMPLAINT  Shortness of Breath   HISTORY OF PRESENT ILLNESS  08/06/18 2:19 AM JASZMINE NAVEJAS is a 63 y.o. female with breast cancer recently treated with a thoracostomy tube for pleural effusion.  The thoracostomy tube has been removed.  She is here with acute shortness of breath which came on rather suddenly about 12:30 AM.  EMS reports wheezing and placed her on oxygen by nonrebreather.  They did not give her a neb treatment.  The patient states symptoms were severe earlier but have improved significantly.  She is having some chest soreness when she coughs or takes a deep breath.  She denies fever, nausea, vomiting or diarrhea but has had a cough.  The patient is no longer receiving chemotherapy for her breast cancer and has DO NOT RESUSCITATE status.  She received a blood transfusion during her recent hospitalization.   Past Medical History:  Diagnosis Date   Anxiety    Arthritis    Bronchitis    COPD (chronic obstructive pulmonary disease) ([place])    Depression    Hypertension    Malignant neoplasm of upper-outer quadrant of left female breast ([place]) 01/04/2015   Nocturia    Numbness and tingling    toes - bilateral   Pneumonia    PONV (postoperative nausea and vomiting)    Nausea    Past Surgical History:  Procedure Laterality Date   BREAST LUMPECTOMY Bilateral    x3   BREAST LUMPECTOMY WITH RADIOACTIVE SEED LOCALIZATION Right 08/23/2015   Procedure: RIGHT BREAST LUMPECTOMY WITH RADIOACTIVE SEED LOCALIZATION;  Surgeon: Alphonsa Overall, MD;  Location: [facility];  Service: General;  Laterality: Right;   CESAREAN SECTION     x4   IR GUIDED DRAIN W CATHETER PLACEMENT  06/02/2018   IR IMAGING GUIDED PORT INSERTION  05/25/2018   IR REMOVAL OF PLURAL CATH W/CUFF  07/28/2018   IR REMOVAL TUN ACCESS W/ PORT W/O FL MOD  SED  10/20/2016   IR THORACENTESIS ASP PLEURAL SPACE W/IMG GUIDE  05/12/2018   IR THORACENTESIS ASP PLEURAL SPACE W/IMG GUIDE  07/07/2018   MASTECTOMY Left 08/23/2015    RIGHT BREAST LUMPECTOMY WITH RADIOACTIVE SEED LOCALIZATION,  LEFT MASTECTOMY WITH AXILLARY LYMPH NODE DISSECTION   MASTECTOMY WITH AXILLARY LYMPH NODE DISSECTION Left 08/23/2015   Procedure: LEFT MASTECTOMY WITH AXILLARY LYMPH NODE DISSECTION;  Surgeon: Alphonsa Overall, MD;  Location: [facility];  Service: General;  Laterality: Left;   MULTIPLE TOOTH EXTRACTIONS      Family History  Problem Relation Age of Onset   Breast cancer Maternal Aunt     Social History   Tobacco Use   Smoking status: Current Every Day Smoker    Packs/day: 0.75    Years: 45.00    Pack years: 33.75    Types: Cigarettes   Smokeless tobacco: Never Used  Substance Use Topics   Alcohol use: Yes    Comment: ocassionally   Drug use: No    Prior to Admission medications   Medication Sig Start Date End Date Taking? Authorizing Provider  albuterol (PROVENTIL HFA;VENTOLIN HFA) 108 (90 Base) MCG/ACT inhaler Inhale 2 puffs into the lungs every 6 (six) hours as needed for wheezing or shortness of breath.    Yes [provider]  albuterol (PROVENTIL) (2.5 MG/3ML) 0.083% nebulizer solution Take 3 mLs (2.5 mg total) by nebulization every 6 (six) hours  as needed for up to 30 days for wheezing or shortness of breath. 07/02/18 08/06/18 Yes Alma Friendly, MD  apixaban (ELIQUIS) 5 MG TABS tablet Take 1 tablet (5 mg total) by mouth 2 (two) times daily. 08/02/18 10/01/18 Yes Kyle, Tyrone A, DO  aspirin EC 81 MG tablet Take 81 mg by mouth daily.   Yes [provider]  dronabinol (MARINOL) 2.5 MG capsule Take 1 capsule (2.5 mg total) by mouth 2 (two) times daily before a meal. 07/21/18  Yes Causey, Charlestine Massed, NP  gabapentin (NEURONTIN) 400 MG capsule Take 1 capsule (400 mg total) by mouth 3 (three) times daily. 07/17/18  Yes Kayleen Memos, DO   lidocaine-prilocaine (EMLA) cream Apply to affected area once Patient taking differently: Apply 1 application topically daily as needed (port access).  05/19/18  Yes Nicholas Lose, MD  metoprolol tartrate (LOPRESSOR) 25 MG tablet Take 0.5 tablets (12.5 mg total) by mouth 2 (two) times daily. 08/04/18 09/03/18 Yes Donne Hazel, MD  Multiple Vitamin (MULTIVITAMIN WITH MINERALS) TABS tablet Take 1 tablet by mouth daily. 07/18/18  Yes Hall, Carole N, DO  nicotine (NICODERM CQ - DOSED IN MG/24 HOURS) 21 mg/24hr patch Place 1 patch (21 mg total) onto the skin daily. 08/02/18  Yes Kyle, Tyrone A, DO  ondansetron (ZOFRAN) 8 MG tablet Take 1 tablet (8 mg total) by mouth every 8 (eight) hours as needed for nausea. 09/23/17  Yes Nicholas Lose, MD  oxyCODONE-acetaminophen (PERCOCET/ROXICET) 5-325 MG tablet Take 1 tablet by mouth every 4 (four) hours as needed for severe pain.   Yes [provider]  PARoxetine (PAXIL) 20 MG tablet Take 1 tablet (20 mg total) by mouth 2 (two) times daily. 08/02/18 10/01/18 Yes Kyle, Tyrone A, DO  potassium chloride (K-DUR) 10 MEQ tablet Take 2 tablets (20 mEq total) by mouth daily. 07/21/18  Yes Causey, Charlestine Massed, NP  PREMARIN vaginal cream INSERT 1 APPLICATORFUL VAGINALLY DAILY Patient taking differently: Place 1 Applicatorful vaginally daily as needed (dryness).  04/19/17  Yes Nicholas Lose, MD  prochlorperazine (COMPAZINE) 10 MG tablet Take 1 tablet (10 mg total) by mouth every 6 (six) hours as needed (Nausea or vomiting). Patient taking differently: Take 10 mg by mouth every 6 (six) hours as needed for nausea or vomiting.  05/19/18  Yes Nicholas Lose, MD  promethazine (PHENERGAN) 12.5 MG tablet Take 1 tablet (12.5 mg total) by mouth every 6 (six) hours as needed for nausea or vomiting. 07/19/18  Yes Nicholas Lose, MD    Allergies Fentanyl and Morphine and related   REVIEW OF SYSTEMS  Negative except as noted here or in the History of Present  Illness.   PHYSICAL EXAMINATION  Initial Vital Signs Blood pressure 101/60, pulse (!) 106, temperature 97.7 F (36.5 C), temperature source Oral, resp. rate 18, height 5\' 7"  (1.702 m), weight 74.4 kg, SpO2 100 %.  Examination General: Well-developed, thin female in no acute distress; appearance consistent with age of record HENT: normocephalic; atraumatic Eyes: pupils equal, round and reactive to light; extraocular muscles intact Neck: supple Heart: regular rate and rhythm; tachycardia Lungs: Decreased breath sounds throughout, notably on the left Chest: Port-A-Cath right upper chest Abdomen: soft; nondistended; nontender; bowel sounds present Extremities: No deformity; full range of motion; pulses normal; IV right dorsal hand Neurologic: Awake, alert and oriented; motor function intact in all extremities and symmetric; no facial droop Skin: Warm and dry Psychiatric: Normal mood and affect   RESULTS  Summary of this visit's results, reviewed  by myself:   EKG Interpretation  Date/Time:  Saturday August 06 2018 02:01:49 EDT Ventricular Rate:  105 PR Interval:    QRS Duration: 137 QT Interval:  384 QTC Calculation: 508 R Axis:   79 Text Interpretation:  Sinus tachycardia Atrial premature complexes Probable left atrial enlargement Right bundle branch block Minimal ST elevation, inferior leads No significant change was found Confirmed by Shanon Rosser [phone]) on 08/06/2018 2:20:55 AM      Laboratory Studies: Results for orders placed or performed during the hospital encounter of 08/06/18 (from the past 24 hour(s))  CBC with Differential/Platelet     Status: Abnormal   Collection Time: 08/06/18  2:40 AM  Result Value Ref Range   WBC 11.2 (H) 4.0 - 10.5 K/uL   RBC 3.20 (L) 3.87 - 5.11 MIL/uL   Hemoglobin 8.5 (L) 12.0 - 15.0 g/dL   HCT 30.0 (L) 36.0 - 46.0 %   MCV 93.8 80.0 - 100.0 fL   MCH 26.6 26.0 - 34.0 pg   MCHC 28.3 (L) 30.0 - 36.0 g/dL   RDW 17.2 (H) 11.5 - 15.5 %    Platelets 416 (H) 150 - 400 K/uL   nRBC 0.0 0.0 - 0.2 %   Neutrophils Relative % 81 %   Neutro Abs 9.1 (H) 1.7 - 7.7 K/uL   Lymphocytes Relative 7 %   Lymphs Abs 0.8 0.7 - 4.0 K/uL   Monocytes Relative 11 %   Monocytes Absolute 1.2 (H) 0.1 - 1.0 K/uL   Eosinophils Relative 0 %   Eosinophils Absolute 0.0 0.0 - 0.5 K/uL   Basophils Relative 0 %   Basophils Absolute 0.0 0.0 - 0.1 K/uL   Immature Granulocytes 1 %   Abs Immature Granulocytes 0.09 (H) 0.00 - 0.07 K/uL  Basic metabolic panel     Status: Abnormal   Collection Time: 08/06/18  2:40 AM  Result Value Ref Range   Sodium 143 135 - 145 mmol/L   Potassium 3.7 3.5 - 5.1 mmol/L   Chloride 102 98 - 111 mmol/L   CO2 31 22 - 32 mmol/L   Glucose, Bld 90 70 - 99 mg/dL   BUN 9 8 - 23 mg/dL   Creatinine, Ser 0.59 0.44 - 1.00 mg/dL   Calcium 8.5 (L) 8.9 - 10.3 mg/dL   GFR calc non Af Amer >60 >60 mL/min   GFR calc Af Amer >60 >60 mL/min   Anion gap 10 5 - 15  SARS Coronavirus 2 [facility] order, Performed in [place] hospital lab) Nasopharyngeal Nasopharyngeal Swab     Status: None   Collection Time: 08/06/18  7:15 AM   Specimen: Nasopharyngeal Swab  Result Value Ref Range   SARS Coronavirus 2 NEGATIVE NEGATIVE  Heparin level (unfractionated)     Status: Abnormal   Collection Time: 08/06/18  4:57 PM  Result Value Ref Range   Heparin Unfractionated 0.98 (H) 0.30 - 0.70 IU/mL  APTT     Status: Abnormal   Collection Time: 08/06/18  4:57 PM  Result Value Ref Range   aPTT 43 (H) 24 - 36 seconds   Imaging Studies: Dg Chest 2 View  Result Date: 08/06/2018 CLINICAL DATA:  Shortness of Breath EXAM: CHEST - 2 VIEW COMPARISON:  07/27/2018 FINDINGS: Large left pleural effusion, increasing since prior study. Diffuse airspace disease throughout the aerated left lung. Small right pleural effusion. Nodular densities noted in the right lung, likely corresponding to the peripheral density seen on prior chest CT. Heart is normal size. Right  Port-A-Cath remains in place with the tip in the SVC. IMPRESSION: Enlarging large left pleural effusion with diffuse left lung airspace disease. Small right pleural effusion with nodular densities in the right mid lung as seen on prior chest CT. Electronically Signed   By: Rolm Baptise M.D.   On: 08/06/2018 03:12   Dg Chest Port 1 View  Result Date: 08/06/2018 CLINICAL DATA:  Status post left thoracentesis EXAM: PORTABLE CHEST 1 VIEW COMPARISON:  Chest radiograph 08/06/2018 FINDINGS: Monitoring leads overlie the patient. Right anterior chest wall Port-A-Cath is present with tip projecting over the superior vena cava. Stable cardiac and mediastinal contours. Interval decrease in size of moderate left pleural effusion. Persistent opacification of the left lung. Minimal peripheral opacities right mid lower lung. No pneumothorax. IMPRESSION: Interval decrease in size of moderate left pleural effusion. Persistent opacification of the left lung. Electronically Signed   By: Lovey Newcomer M.D.   On: 08/06/2018 11:04   US Thoracentesis Asp Pleural Space W/img Guide  Result Date: 08/06/2018 INDICATION: Lung cancer, shortness of breath, recurrent left pleural effusion EXAM: ULTRASOUND GUIDED LEFT THORACENTESIS MEDICATIONS: 1% lidocaine local COMPLICATIONS: None immediate. PROCEDURE: An ultrasound guided thoracentesis was thoroughly discussed with the patient and questions answered. The benefits, risks, alternatives and complications were also discussed. The patient understands and wishes to proceed with the procedure. Written consent was obtained. Ultrasound was performed to localize and mark an adequate pocket of fluid in the left chest. The area was then prepped and draped in the normal sterile fashion. 1% Lidocaine was used for local anesthesia. Under ultrasound guidance a 6 Fr Safe-T-Centesis catheter was introduced. Thoracentesis was performed. The catheter was removed and a dressing applied. FINDINGS: A total of  approximately 300 cc of blood tinged pleural fluid was removed. Sample was not sent for laboratory analysis IMPRESSION: Successful ultrasound guided left thoracentesis yielding 300 cc of pleural fluid. Electronically Signed   By: Jerilynn Mages.  Shick M.D.   On: 08/06/2018 10:43    ED COURSE and MDM  Nursing notes and initial vitals signs, including pulse oximetry, reviewed.  Vitals:   08/06/18 1010 08/06/18 1033 08/06/18 1225 08/06/18 2103  BP: 115/74 114/82 130/81 110/78  Pulse:   (!) 108 95  Resp:   18 20  Temp:   98.9 F (37.2 C) 98.5 F (36.9 C)  TempSrc:   Oral Oral  SpO2:   93% 100%  Weight:      Height:       Dr. Maudie Mercury to admit to hospitalist service for symptomatic reaccumulation of left pleural effusion.  PROCEDURES    ED DIAGNOSES     ICD-10-CM   1. Pleural effusion on left  J90 DG Chest [facility] 1 View    DG Chest Port 1 View  2. Malignant pleural effusion  J91.0 US THORACENTESIS ASP PLEURAL SPACE W/IMG GUIDE    US THORACENTESIS ASP PLEURAL SPACE W/IMG GUIDE    IR PERC PLEURAL DRAIN W/INDWELL CATH W/IMG GUIDE    IR PERC PLEURAL DRAIN W/INDWELL CATH W/IMG GUIDE    CANCELED: IR Radiologist Eval & Mgmt    CANCELED: IR Radiologist Eval & Mgmt       Sheilia Reznick, MD 08/06/18 2245

## 2018-08-07 DIAGNOSIS — J910 Malignant pleural effusion: Secondary | ICD-10-CM

## 2018-08-07 LAB — CBC
HCT: 29.3 % — ABNORMAL LOW (ref 36.0–46.0)
Hemoglobin: 8 g/dL — ABNORMAL LOW (ref 12.0–15.0)
MCH: 25.9 pg — ABNORMAL LOW (ref 26.0–34.0)
MCHC: 27.3 g/dL — ABNORMAL LOW (ref 30.0–36.0)
MCV: 94.8 fL (ref 80.0–100.0)
Platelets: 454 10*3/uL — ABNORMAL HIGH (ref 150–400)
RBC: 3.09 MIL/uL — ABNORMAL LOW (ref 3.87–5.11)
RDW: 17.3 % — ABNORMAL HIGH (ref 11.5–15.5)
WBC: 8.4 10*3/uL (ref 4.0–10.5)
nRBC: 0 % (ref 0.0–0.2)

## 2018-08-07 LAB — APTT
aPTT: 74 seconds — ABNORMAL HIGH (ref 24–36)
aPTT: 56 seconds — ABNORMAL HIGH (ref 24–36)

## 2018-08-07 LAB — HEPARIN LEVEL (UNFRACTIONATED)
Heparin Unfractionated: 0.85 IU/mL — ABNORMAL HIGH (ref 0.30–0.70)
Heparin Unfractionated: 0.54 IU/mL (ref 0.30–0.70)

## 2018-08-07 MED ORDER — HEPARIN SOD (PORK) LOCK FLUSH 100 UNIT/ML IV SOLN
500.0000 [IU] | INTRAVENOUS | Status: AC | PRN
  Administered 2018-08-07: 15:00:00 500 [IU]

## 2018-08-07 MED ORDER — HEPARIN (PORCINE) 25000 UT/250ML-% IV SOLN
1300.0000 [IU]/h | INTRAVENOUS | Status: DC
  Filled 2018-08-07: qty 250

## 2018-08-07 NOTE — Progress Notes (Signed)
ANTICOAGULATION CONSULT NOTE - Follow Up Consult  Pharmacy Consult for Heparin Indication: pulmonary embolus - bridge therapy while apixaban on hold  Allergies  Allergen Reactions  . Fentanyl Nausea And Vomiting and Other (See Comments)    Headache  . Morphine And Related Nausea And Vomiting and Other (See Comments)    Headache    Patient Measurements: Height: 5\' 7"  (170.2 cm) Weight: 160 lb 4.4 oz (72.7 kg) IBW/kg (Calculated) : 61.6 Heparin Dosing Weight:   Vital Signs: Temp: 98.5 F (36.9 C) (08/01 2103) Temp Source: Oral (08/01 2103) BP: 110/78 (08/01 2103) Pulse Rate: 95 (08/01 2103)  Labs: Recent Labs    08/04/18 0441 08/06/18 0240 08/06/18 1657 08/07/18 0012 08/07/18 0058  HGB 8.4* 8.5*  --   --  8.0*  HCT 30.0* 30.0*  --   --  29.3*  PLT 397 416*  --   --  454*  APTT  --   --  43* 74*  --   HEPARINUNFRC  --   --  0.98* 0.85*  --   CREATININE  --  0.59  --   --   --     Estimated Creatinine Clearance: 70 mL/min (by C-G formula based on SCr of 0.59 mg/dL).   Medications:  Infusions:  . sodium chloride    . heparin 1,200 Units/hr (08/06/18 1752)    Assessment: Patient with Heparin level above goal but PTT at goal.  No heparin issues noted.  PTT ordered with Heparin level until both correlate due to possible drug-lab interaction between oral anticoagulant (rivaroxaban, edoxaban, or apixaban) and anti-Xa level (aka heparin level)   Goal of Therapy:  Heparin level 0.3-0.7 units/ml aPTT 66-102 seconds Monitor platelets by anticoagulation protocol: Yes   Plan:  Continue heparin drip at current rate Recheck levels at 0800  Tyler Deis, Shea Stakes [place] 08/07/2018,3:50 AM

## 2018-08-07 NOTE — Discharge Summary (Signed)
Physician Discharge Summary  Deborah Mcintyre XFG:[id number] DOB: January 11, 1955 DOA: 08/06/2018  PCP: [name], MD  Admit date: 08/06/2018 Discharge date: 08/07/2018  Admitted From: Home Disposition:  Home  Recommendations for Outpatient Follow-up:   Follow up with outpatient palliative care as scheduled this evening at 6pm. Patient remains hospice appropriate.   Follow-up with Dr. Lindi Adie 8/14 as scheduled  Discharge Condition: Stable but poor prognosis  CODE STATUS: Full  Diet recommendation: Regular   Brief/Interim Summary: Deborah Mcintyre is a 63 year old female with past medical history significant for COPD, history of metastatic breast cancer with malignant recurrent left pleural effusion, recently diagnosed PE on Eliquis who presents to the hospital with complaints of shortness of breath, left-sided chest pain.  She was recently admitted to the hospital July 22 to July 30.  At that time, her Pleurx drain was removed as it was no longer functioning.  Patient underwent thoracentesis this morning, only was able to remove 300 cc due to loculation.  Patient states that she feels mildly better after thoracentesis, but continues to have left-sided chest pain and shortness of breath at rest.  I discussed with cardiothoracic surgery on-call to see if there was anything that they could offer patient with recurrent malignant pleural effusion.  Sometimes, they are able to offer VATS and decortication, although this surgery would not prolong patient's life and would be symptom management only.  However, with patient's recent diagnosis of PE and other comorbidities, patient is not a surgical candidate.  I had a frank discussion with patient.  I discussed with her that she has recurrent malignant pleural effusion that is causing shortness of breath and chest pain.  I discussed with her that Pleurx drain is likely not an option for her any longer.  She is certainly not a surgical candidate.  Dr.  Lindi Adie had previously recommended hospice for patient.  She had met with palliative care in the past for goals of care conversation.  I believe that patient is hospice appropriate.  I discussed with husband over the phone, apparently they have a palliative care meeting 8/2 at 6 PM.  Patient and husband were eager for patient to get back home.  I discussed with him goals for keeping her comfortable at home with home hospice and preventing recurrent hospitalization as unfortunately there is nothing more medical that we can offer her to extend her life.  Discharge Diagnoses:  Principal Problem:   Malignant pleural effusion Active Problems:   Hypertension   COPD (chronic obstructive pulmonary disease) (HCC)   Anxiety and depression   Pleural effusion on left   Pulmonary emboli (HCC)   Metastatic breast cancer [facility])          Discharge Instructions  Discharge Instructions    Diet general   Complete by: As directed    Discharge instructions   Complete by: As directed    You were cared for by a hospitalist during your hospital stay. If you have any questions about your discharge medications or the care you received while you were in the hospital after you are discharged, you can call the unit and ask to speak with the hospitalist on call if the hospitalist that took care of you is not available. Once you are discharged, your primary care physician will handle any further medical issues. Please note that NO REFILLS for any discharge medications will be authorized once you are discharged, as it is imperative that you return to your primary care physician (or establish a  Physician Discharge Summary  Deborah Mcintyre MRN:[id number] DOB: 04/11/1955 DOA: 08/06/2018  PCP: Kadakia, Ajay, MD  Admit date: 08/06/2018 Discharge date: 08/07/2018  Admitted From: Home Disposition:  Home  Recommendations for Outpatient Follow-up:   Follow up with outpatient palliative care as scheduled this evening at 6pm. Patient remains hospice appropriate.   Follow-up with Dr. Gudena 8/14 as scheduled  Discharge Condition: Stable but poor prognosis  CODE STATUS: Full  Diet recommendation: Regular   Brief/Interim Summary: Deborah Mcintyre is a 63-year-old female with past medical history significant for COPD, history of metastatic breast cancer with malignant recurrent left pleural effusion, recently diagnosed PE on Eliquis who presents to the hospital with complaints of shortness of breath, left-sided chest pain.  She was recently admitted to the hospital July 22 to July 30.  At that time, her Pleurx drain was removed as it was no longer functioning.  Patient underwent thoracentesis this morning, only was able to remove 300 cc due to loculation.  Patient states that she feels mildly better after thoracentesis, but continues to have left-sided chest pain and shortness of breath at rest.  I discussed with cardiothoracic surgery on-call to see if there was anything that they could offer patient with recurrent malignant pleural effusion.  Sometimes, they are able to offer VATS and decortication, although this surgery would not prolong patient's life and would be symptom management only.  However, with patient's recent diagnosis of PE and other comorbidities, patient is not a surgical candidate.  I had a frank discussion with patient.  I discussed with her that she has recurrent malignant pleural effusion that is causing shortness of breath and chest pain.  I discussed with her that Pleurx drain is likely not an option for her any longer.  She is certainly not a surgical candidate.  Dr.  Gudena had previously recommended hospice for patient.  She had met with palliative care in the past for goals of care conversation.  I believe that patient is hospice appropriate.  I discussed with husband over the phone, apparently they have a palliative care meeting 8/2 at 6 PM.  Patient and husband were eager for patient to get back home.  I discussed with him goals for keeping her comfortable at home with home hospice and preventing recurrent hospitalization as unfortunately there is nothing more medical that we can offer her to extend her life.  Discharge Diagnoses:  Principal Problem:   Malignant pleural effusion Active Problems:   Hypertension   COPD (chronic obstructive pulmonary disease) (HCC)   Anxiety and depression   Pleural effusion on left   Pulmonary emboli (HCC)   Metastatic breast cancer (HCC)          Discharge Instructions  Discharge Instructions    Diet general   Complete by: As directed    Discharge instructions   Complete by: As directed    You were cared for by a hospitalist during your hospital stay. If you have any questions about your discharge medications or the care you received while you were in the hospital after you are discharged, you can call the unit and ask to speak with the hospitalist on call if the hospitalist that took care of you is not available. Once you are discharged, your primary care physician will handle any further medical issues. Please note that NO REFILLS for any discharge medications will be authorized once you are discharged, as it is imperative that you return to your primary care physician (or establish a   Physician Discharge Summary  Deborah Mcintyre MRN:[id number] DOB: 04/04/1955 DOA: 08/06/2018  PCP: Kadakia, Ajay, MD  Admit date: 08/06/2018 Discharge date: 08/07/2018  Admitted From: Home Disposition:  Home  Recommendations for Outpatient Follow-up:   Follow up with outpatient palliative care as scheduled this evening at 6pm. Patient remains hospice appropriate.   Follow-up with Dr. Gudena 8/14 as scheduled  Discharge Condition: Stable but poor prognosis  CODE STATUS: Full  Diet recommendation: Regular   Brief/Interim Summary: Deborah Mcintyre is a 63-year-old female with past medical history significant for COPD, history of metastatic breast cancer with malignant recurrent left pleural effusion, recently diagnosed PE on Eliquis who presents to the hospital with complaints of shortness of breath, left-sided chest pain.  She was recently admitted to the hospital July 22 to July 30.  At that time, her Pleurx drain was removed as it was no longer functioning.  Patient underwent thoracentesis this morning, only was able to remove 300 cc due to loculation.  Patient states that she feels mildly better after thoracentesis, but continues to have left-sided chest pain and shortness of breath at rest.  I discussed with cardiothoracic surgery on-call to see if there was anything that they could offer patient with recurrent malignant pleural effusion.  Sometimes, they are able to offer VATS and decortication, although this surgery would not prolong patient's life and would be symptom management only.  However, with patient's recent diagnosis of PE and other comorbidities, patient is not a surgical candidate.  I had a frank discussion with patient.  I discussed with her that she has recurrent malignant pleural effusion that is causing shortness of breath and chest pain.  I discussed with her that Pleurx drain is likely not an option for her any longer.  She is certainly not a surgical candidate.  Dr.  Gudena had previously recommended hospice for patient.  She had met with palliative care in the past for goals of care conversation.  I believe that patient is hospice appropriate.  I discussed with husband over the phone, apparently they have a palliative care meeting 8/2 at 6 PM.  Patient and husband were eager for patient to get back home.  I discussed with him goals for keeping her comfortable at home with home hospice and preventing recurrent hospitalization as unfortunately there is nothing more medical that we can offer her to extend her life.  Discharge Diagnoses:  Principal Problem:   Malignant pleural effusion Active Problems:   Hypertension   COPD (chronic obstructive pulmonary disease) (HCC)   Anxiety and depression   Pleural effusion on left   Pulmonary emboli (HCC)   Metastatic breast cancer (HCC)          Discharge Instructions  Discharge Instructions    Diet general   Complete by: As directed    Discharge instructions   Complete by: As directed    You were cared for by a hospitalist during your hospital stay. If you have any questions about your discharge medications or the care you received while you were in the hospital after you are discharged, you can call the unit and ask to speak with the hospitalist on call if the hospitalist that took care of you is not available. Once you are discharged, your primary care physician will handle any further medical issues. Please note that NO REFILLS for any discharge medications will be authorized once you are discharged, as it is imperative that you return to your primary care physician (or establish a   Physician Discharge Summary  Deborah Mcintyre MRN:[id number] DOB: 04/11/1955 DOA: 08/06/2018  PCP: Kadakia, Ajay, MD  Admit date: 08/06/2018 Discharge date: 08/07/2018  Admitted From: Home Disposition:  Home  Recommendations for Outpatient Follow-up:   Follow up with outpatient palliative care as scheduled this evening at 6pm. Patient remains hospice appropriate.   Follow-up with Dr. Gudena 8/14 as scheduled  Discharge Condition: Stable but poor prognosis  CODE STATUS: Full  Diet recommendation: Regular   Brief/Interim Summary: Deborah Mcintyre is a 63-year-old female with past medical history significant for COPD, history of metastatic breast cancer with malignant recurrent left pleural effusion, recently diagnosed PE on Eliquis who presents to the hospital with complaints of shortness of breath, left-sided chest pain.  She was recently admitted to the hospital July 22 to July 30.  At that time, her Pleurx drain was removed as it was no longer functioning.  Patient underwent thoracentesis this morning, only was able to remove 300 cc due to loculation.  Patient states that she feels mildly better after thoracentesis, but continues to have left-sided chest pain and shortness of breath at rest.  I discussed with cardiothoracic surgery on-call to see if there was anything that they could offer patient with recurrent malignant pleural effusion.  Sometimes, they are able to offer VATS and decortication, although this surgery would not prolong patient's life and would be symptom management only.  However, with patient's recent diagnosis of PE and other comorbidities, patient is not a surgical candidate.  I had a frank discussion with patient.  I discussed with her that she has recurrent malignant pleural effusion that is causing shortness of breath and chest pain.  I discussed with her that Pleurx drain is likely not an option for her any longer.  She is certainly not a surgical candidate.  Dr.  Gudena had previously recommended hospice for patient.  She had met with palliative care in the past for goals of care conversation.  I believe that patient is hospice appropriate.  I discussed with husband over the phone, apparently they have a palliative care meeting 8/2 at 6 PM.  Patient and husband were eager for patient to get back home.  I discussed with him goals for keeping her comfortable at home with home hospice and preventing recurrent hospitalization as unfortunately there is nothing more medical that we can offer her to extend her life.  Discharge Diagnoses:  Principal Problem:   Malignant pleural effusion Active Problems:   Hypertension   COPD (chronic obstructive pulmonary disease) (HCC)   Anxiety and depression   Pleural effusion on left   Pulmonary emboli (HCC)   Metastatic breast cancer (HCC)          Discharge Instructions  Discharge Instructions    Diet general   Complete by: As directed    Discharge instructions   Complete by: As directed    You were cared for by a hospitalist during your hospital stay. If you have any questions about your discharge medications or the care you received while you were in the hospital after you are discharged, you can call the unit and ask to speak with the hospitalist on call if the hospitalist that took care of you is not available. Once you are discharged, your primary care physician will handle any further medical issues. Please note that NO REFILLS for any discharge medications will be authorized once you are discharged, as it is imperative that you return to your primary care physician (or establish a   Physician Discharge Summary  Deborah Mcintyre MRN:[id number] DOB: 04/04/1955 DOA: 08/06/2018  PCP: Kadakia, Ajay, MD  Admit date: 08/06/2018 Discharge date: 08/07/2018  Admitted From: Home Disposition:  Home  Recommendations for Outpatient Follow-up:   Follow up with outpatient palliative care as scheduled this evening at 6pm. Patient remains hospice appropriate.   Follow-up with Dr. Gudena 8/14 as scheduled  Discharge Condition: Stable but poor prognosis  CODE STATUS: Full  Diet recommendation: Regular   Brief/Interim Summary: Deborah Mcintyre is a 63-year-old female with past medical history significant for COPD, history of metastatic breast cancer with malignant recurrent left pleural effusion, recently diagnosed PE on Eliquis who presents to the hospital with complaints of shortness of breath, left-sided chest pain.  She was recently admitted to the hospital July 22 to July 30.  At that time, her Pleurx drain was removed as it was no longer functioning.  Patient underwent thoracentesis this morning, only was able to remove 300 cc due to loculation.  Patient states that she feels mildly better after thoracentesis, but continues to have left-sided chest pain and shortness of breath at rest.  I discussed with cardiothoracic surgery on-call to see if there was anything that they could offer patient with recurrent malignant pleural effusion.  Sometimes, they are able to offer VATS and decortication, although this surgery would not prolong patient's life and would be symptom management only.  However, with patient's recent diagnosis of PE and other comorbidities, patient is not a surgical candidate.  I had a frank discussion with patient.  I discussed with her that she has recurrent malignant pleural effusion that is causing shortness of breath and chest pain.  I discussed with her that Pleurx drain is likely not an option for her any longer.  She is certainly not a surgical candidate.  Dr.  Gudena had previously recommended hospice for patient.  She had met with palliative care in the past for goals of care conversation.  I believe that patient is hospice appropriate.  I discussed with husband over the phone, apparently they have a palliative care meeting 8/2 at 6 PM.  Patient and husband were eager for patient to get back home.  I discussed with him goals for keeping her comfortable at home with home hospice and preventing recurrent hospitalization as unfortunately there is nothing more medical that we can offer her to extend her life.  Discharge Diagnoses:  Principal Problem:   Malignant pleural effusion Active Problems:   Hypertension   COPD (chronic obstructive pulmonary disease) (HCC)   Anxiety and depression   Pleural effusion on left   Pulmonary emboli (HCC)   Metastatic breast cancer (HCC)          Discharge Instructions  Discharge Instructions    Diet general   Complete by: As directed    Discharge instructions   Complete by: As directed    You were cared for by a hospitalist during your hospital stay. If you have any questions about your discharge medications or the care you received while you were in the hospital after you are discharged, you can call the unit and ask to speak with the hospitalist on call if the hospitalist that took care of you is not available. Once you are discharged, your primary care physician will handle any further medical issues. Please note that NO REFILLS for any discharge medications will be authorized once you are discharged, as it is imperative that you return to your primary care physician (or establish a   Physician Discharge Summary  Deborah Mcintyre XFG:[id number] DOB: January 11, 1955 DOA: 08/06/2018  PCP: [name], MD  Admit date: 08/06/2018 Discharge date: 08/07/2018  Admitted From: Home Disposition:  Home  Recommendations for Outpatient Follow-up:   Follow up with outpatient palliative care as scheduled this evening at 6pm. Patient remains hospice appropriate.   Follow-up with Dr. Lindi Adie 8/14 as scheduled  Discharge Condition: Stable but poor prognosis  CODE STATUS: Full  Diet recommendation: Regular   Brief/Interim Summary: Deborah Mcintyre is a 63 year old female with past medical history significant for COPD, history of metastatic breast cancer with malignant recurrent left pleural effusion, recently diagnosed PE on Eliquis who presents to the hospital with complaints of shortness of breath, left-sided chest pain.  She was recently admitted to the hospital July 22 to July 30.  At that time, her Pleurx drain was removed as it was no longer functioning.  Patient underwent thoracentesis this morning, only was able to remove 300 cc due to loculation.  Patient states that she feels mildly better after thoracentesis, but continues to have left-sided chest pain and shortness of breath at rest.  I discussed with cardiothoracic surgery on-call to see if there was anything that they could offer patient with recurrent malignant pleural effusion.  Sometimes, they are able to offer VATS and decortication, although this surgery would not prolong patient's life and would be symptom management only.  However, with patient's recent diagnosis of PE and other comorbidities, patient is not a surgical candidate.  I had a frank discussion with patient.  I discussed with her that she has recurrent malignant pleural effusion that is causing shortness of breath and chest pain.  I discussed with her that Pleurx drain is likely not an option for her any longer.  She is certainly not a surgical candidate.  Dr.  Lindi Adie had previously recommended hospice for patient.  She had met with palliative care in the past for goals of care conversation.  I believe that patient is hospice appropriate.  I discussed with husband over the phone, apparently they have a palliative care meeting 8/2 at 6 PM.  Patient and husband were eager for patient to get back home.  I discussed with him goals for keeping her comfortable at home with home hospice and preventing recurrent hospitalization as unfortunately there is nothing more medical that we can offer her to extend her life.  Discharge Diagnoses:  Principal Problem:   Malignant pleural effusion Active Problems:   Hypertension   COPD (chronic obstructive pulmonary disease) (HCC)   Anxiety and depression   Pleural effusion on left   Pulmonary emboli (HCC)   Metastatic breast cancer [facility])          Discharge Instructions  Discharge Instructions    Diet general   Complete by: As directed    Discharge instructions   Complete by: As directed    You were cared for by a hospitalist during your hospital stay. If you have any questions about your discharge medications or the care you received while you were in the hospital after you are discharged, you can call the unit and ask to speak with the hospitalist on call if the hospitalist that took care of you is not available. Once you are discharged, your primary care physician will handle any further medical issues. Please note that NO REFILLS for any discharge medications will be authorized once you are discharged, as it is imperative that you return to your primary care physician (or establish a  Physician Discharge Summary  Deborah Mcintyre MRN:[id number] DOB: 04/11/1955 DOA: 08/06/2018  PCP: Kadakia, Ajay, MD  Admit date: 08/06/2018 Discharge date: 08/07/2018  Admitted From: Home Disposition:  Home  Recommendations for Outpatient Follow-up:   Follow up with outpatient palliative care as scheduled this evening at 6pm. Patient remains hospice appropriate.   Follow-up with Dr. Gudena 8/14 as scheduled  Discharge Condition: Stable but poor prognosis  CODE STATUS: Full  Diet recommendation: Regular   Brief/Interim Summary: Deborah Mcintyre is a 63-year-old female with past medical history significant for COPD, history of metastatic breast cancer with malignant recurrent left pleural effusion, recently diagnosed PE on Eliquis who presents to the hospital with complaints of shortness of breath, left-sided chest pain.  She was recently admitted to the hospital July 22 to July 30.  At that time, her Pleurx drain was removed as it was no longer functioning.  Patient underwent thoracentesis this morning, only was able to remove 300 cc due to loculation.  Patient states that she feels mildly better after thoracentesis, but continues to have left-sided chest pain and shortness of breath at rest.  I discussed with cardiothoracic surgery on-call to see if there was anything that they could offer patient with recurrent malignant pleural effusion.  Sometimes, they are able to offer VATS and decortication, although this surgery would not prolong patient's life and would be symptom management only.  However, with patient's recent diagnosis of PE and other comorbidities, patient is not a surgical candidate.  I had a frank discussion with patient.  I discussed with her that she has recurrent malignant pleural effusion that is causing shortness of breath and chest pain.  I discussed with her that Pleurx drain is likely not an option for her any longer.  She is certainly not a surgical candidate.  Dr.  Gudena had previously recommended hospice for patient.  She had met with palliative care in the past for goals of care conversation.  I believe that patient is hospice appropriate.  I discussed with husband over the phone, apparently they have a palliative care meeting 8/2 at 6 PM.  Patient and husband were eager for patient to get back home.  I discussed with him goals for keeping her comfortable at home with home hospice and preventing recurrent hospitalization as unfortunately there is nothing more medical that we can offer her to extend her life.  Discharge Diagnoses:  Principal Problem:   Malignant pleural effusion Active Problems:   Hypertension   COPD (chronic obstructive pulmonary disease) (HCC)   Anxiety and depression   Pleural effusion on left   Pulmonary emboli (HCC)   Metastatic breast cancer (HCC)          Discharge Instructions  Discharge Instructions    Diet general   Complete by: As directed    Discharge instructions   Complete by: As directed    You were cared for by a hospitalist during your hospital stay. If you have any questions about your discharge medications or the care you received while you were in the hospital after you are discharged, you can call the unit and ask to speak with the hospitalist on call if the hospitalist that took care of you is not available. Once you are discharged, your primary care physician will handle any further medical issues. Please note that NO REFILLS for any discharge medications will be authorized once you are discharged, as it is imperative that you return to your primary care physician (or establish a   Physician Discharge Summary  Deborah Mcintyre [id number]:[id number] DOB: January 11, 1955 DOA: 08/06/2018  PCP: [name], MD  Admit date: 08/06/2018 Discharge date: 08/07/2018  Admitted From: Home Disposition:  Home  Recommendations for Outpatient Follow-up:   Follow up with outpatient palliative care as scheduled this evening at 6pm. Patient remains hospice appropriate.   Follow-up with Dr. Lindi Adie 8/14 as scheduled  Discharge Condition: Stable but poor prognosis  CODE STATUS: Full  Diet recommendation: Regular   Brief/Interim Summary: Deborah Mcintyre is a 63 year old female with past medical history significant for COPD, history of metastatic breast cancer with malignant recurrent left pleural effusion, recently diagnosed PE on Eliquis who presents to the hospital with complaints of shortness of breath, left-sided chest pain.  She was recently admitted to the hospital July 22 to July 30.  At that time, her Pleurx drain was removed as it was no longer functioning.  Patient underwent thoracentesis this morning, only was able to remove 300 cc due to loculation.  Patient states that she feels mildly better after thoracentesis, but continues to have left-sided chest pain and shortness of breath at rest.  I discussed with cardiothoracic surgery on-call to see if there was anything that they could offer patient with recurrent malignant pleural effusion.  Sometimes, they are able to offer VATS and decortication, although this surgery would not prolong patient's life and would be symptom management only.  However, with patient's recent diagnosis of PE and other comorbidities, patient is not a surgical candidate.  I had a frank discussion with patient.  I discussed with her that she has recurrent malignant pleural effusion that is causing shortness of breath and chest pain.  I discussed with her that Pleurx drain is likely not an option for her any longer.  She is certainly not a surgical candidate.  Dr.  Lindi Adie had previously recommended hospice for patient.  She had met with palliative care in the past for goals of care conversation.  I believe that patient is hospice appropriate.  I discussed with husband over the phone, apparently they have a palliative care meeting 8/2 at 6 PM.  Patient and husband were eager for patient to get back home.  I discussed with him goals for keeping her comfortable at home with home hospice and preventing recurrent hospitalization as unfortunately there is nothing more medical that we can offer her to extend her life.  Discharge Diagnoses:  Principal Problem:   Malignant pleural effusion Active Problems:   Hypertension   COPD (chronic obstructive pulmonary disease) (HCC)   Anxiety and depression   Pleural effusion on left   Pulmonary emboli (HCC)   Metastatic breast cancer [facility])          Discharge Instructions  Discharge Instructions    Diet general   Complete by: As directed    Discharge instructions   Complete by: As directed    You were cared for by a hospitalist during your hospital stay. If you have any questions about your discharge medications or the care you received while you were in the hospital after you are discharged, you can call the unit and ask to speak with the hospitalist on call if the hospitalist that took care of you is not available. Once you are discharged, your primary care physician will handle any further medical issues. Please note that NO REFILLS for any discharge medications will be authorized once you are discharged, as it is imperative that you return to your primary care physician (or establish a  Physician Discharge Summary  Deborah Mcintyre [id number]:[id number] DOB: January 11, 1955 DOA: 08/06/2018  PCP: [name], MD  Admit date: 08/06/2018 Discharge date: 08/07/2018  Admitted From: Home Disposition:  Home  Recommendations for Outpatient Follow-up:   Follow up with outpatient palliative care as scheduled this evening at 6pm. Patient remains hospice appropriate.   Follow-up with Dr. Lindi Adie 8/14 as scheduled  Discharge Condition: Stable but poor prognosis  CODE STATUS: Full  Diet recommendation: Regular   Brief/Interim Summary: Deborah Mcintyre is a 63 year old female with past medical history significant for COPD, history of metastatic breast cancer with malignant recurrent left pleural effusion, recently diagnosed PE on Eliquis who presents to the hospital with complaints of shortness of breath, left-sided chest pain.  She was recently admitted to the hospital July 22 to July 30.  At that time, her Pleurx drain was removed as it was no longer functioning.  Patient underwent thoracentesis this morning, only was able to remove 300 cc due to loculation.  Patient states that she feels mildly better after thoracentesis, but continues to have left-sided chest pain and shortness of breath at rest.  I discussed with cardiothoracic surgery on-call to see if there was anything that they could offer patient with recurrent malignant pleural effusion.  Sometimes, they are able to offer VATS and decortication, although this surgery would not prolong patient's life and would be symptom management only.  However, with patient's recent diagnosis of PE and other comorbidities, patient is not a surgical candidate.  I had a frank discussion with patient.  I discussed with her that she has recurrent malignant pleural effusion that is causing shortness of breath and chest pain.  I discussed with her that Pleurx drain is likely not an option for her any longer.  She is certainly not a surgical candidate.  Dr.  Lindi Adie had previously recommended hospice for patient.  She had met with palliative care in the past for goals of care conversation.  I believe that patient is hospice appropriate.  I discussed with husband over the phone, apparently they have a palliative care meeting 8/2 at 6 PM.  Patient and husband were eager for patient to get back home.  I discussed with him goals for keeping her comfortable at home with home hospice and preventing recurrent hospitalization as unfortunately there is nothing more medical that we can offer her to extend her life.  Discharge Diagnoses:  Principal Problem:   Malignant pleural effusion Active Problems:   Hypertension   COPD (chronic obstructive pulmonary disease) (HCC)   Anxiety and depression   Pleural effusion on left   Pulmonary emboli (HCC)   Metastatic breast cancer [facility])          Discharge Instructions  Discharge Instructions    Diet general   Complete by: As directed    Discharge instructions   Complete by: As directed    You were cared for by a hospitalist during your hospital stay. If you have any questions about your discharge medications or the care you received while you were in the hospital after you are discharged, you can call the unit and ask to speak with the hospitalist on call if the hospitalist that took care of you is not available. Once you are discharged, your primary care physician will handle any further medical issues. Please note that NO REFILLS for any discharge medications will be authorized once you are discharged, as it is imperative that you return to your primary care physician (or establish a

## 2018-08-07 NOTE — Progress Notes (Signed)
Patient discharged to home with husband, discharge instructions reviewed with husband who verbalized understanding. No new RX.

## 2018-08-07 NOTE — Care Management Obs Status (Signed)
MEDICARE OBSERVATION STATUS NOTIFICATION   Patient Details  Name: Deborah Mcintyre MRN: [id number] Date of Birth: July 30, 1955   Medicare Observation Status Notification Given:  Yes    Joaquin Courts, RN 08/07/2018, 1:59 PM

## 2018-08-07 NOTE — Progress Notes (Signed)
ANTICOAGULATION CONSULT NOTE - Follow Up Consult  Pharmacy Consult for Heparin Indication: pulmonary embolus - bridge therapy while apixaban on hold  Allergies  Allergen Reactions  . Fentanyl Nausea And Vomiting and Other (See Comments)    Headache  . Morphine And Related Nausea And Vomiting and Other (See Comments)    Headache    Patient Measurements: Height: 5\' 7"  (170.2 cm) Weight: 162 lb 11.2 oz (73.8 kg) IBW/kg (Calculated) : 61.6 Heparin Dosing Weight: 72.7 kg  Vital Signs: Temp: 98.3 F (36.8 C) (08/02 0528) Temp Source: Oral (08/02 0528) BP: 112/75 (08/02 0528) Pulse Rate: 94 (08/02 0528)  Labs: Recent Labs    08/06/18 0240 08/06/18 1657 08/07/18 0012 08/07/18 0058  HGB 8.5*  --   --  8.0*  HCT 30.0*  --   --  29.3*  PLT 416*  --   --  454*  APTT  --  43* 74*  --   HEPARINUNFRC  --  0.98* 0.85*  --   CREATININE 0.59  --   --   --     Estimated Creatinine Clearance: 70 mL/min (by C-G formula based on SCr of 0.59 mg/dL).   Medications:  Infusions:  . sodium chloride    . heparin 1,200 Units/hr (08/06/18 1752)    Assessment: 63 yo F on apixaban PTA for PE.  Pharmacy consulted to dose heparin for bridge therapy while apixaban on hold for possible invasive procedure. PTA apixaban 5 mg po BID, last dose 7/31 at 2100.  Today, 08/07/2018:  HL 0.54, decreased to therapeutic range (still need to confirm correlation with APTT)  Confirmatory APTT 56 is below goal.  (APTT ordered with Heparin level until both correlate due to possible drug-lab interaction between oral anticoagulant (rivaroxaban, edoxaban, or apixaban) and anti-Xa level.)  SCr  0.59  CBC: Hgb remains low, 8.  Plt elevated.  No bleeding or complications reported. S/p thoracentesis for recurrent metastatic loculated pleural effusion.   Goal of Therapy:  Heparin level 0.3-0.7 units/ml aPTT 66-102 seconds Monitor platelets by anticoagulation protocol: Yes   Plan:  Increase to heparin IV  infusion at 1300 units/hr 6 hour APTT and HL.  Continue checking APTT until correlates with HL. Daily heparin level and CBC F/u plans for further procedures:  IR to re-eval on Monday for possible pleurex catheter (unlikely).    Gretta Arab PharmD, BCPS Clinical pharmacist phone 7am- 5pm: [phone] 08/07/2018 7:37 AM

## 2018-08-09 ENCOUNTER — Telehealth: Payer: Self-pay

## 2018-08-09 NOTE — Telephone Encounter (Signed)
RN collaborated with hospice RN regarding patient's medication list.  MD reviewed list and provided medication reduction.  Hospice nurse, [facility], notified.

## 2018-08-19 ENCOUNTER — Inpatient Hospital Stay: Payer: Medicare Other

## 2018-08-19 ENCOUNTER — Inpatient Hospital Stay: Payer: Medicare Other | Admitting: Hematology and Oncology

## 2018-08-31 ENCOUNTER — Other Ambulatory Visit (HOSPITAL_COMMUNITY): Payer: Medicare Other

## 2018-08-31 ENCOUNTER — Other Ambulatory Visit: Payer: Self-pay | Admitting: Hematology and Oncology

## 2018-08-31 ENCOUNTER — Ambulatory Visit (HOSPITAL_COMMUNITY): Payer: Medicare Other

## 2018-08-31 ENCOUNTER — Inpatient Hospital Stay (HOSPITAL_COMMUNITY): Admission: RE | Admit: 2018-08-31 | Payer: Medicare Other | Source: Ambulatory Visit

## 2018-09-09 ENCOUNTER — Telehealth: Payer: Self-pay

## 2018-09-09 MED ORDER — ALBUTEROL SULFATE HFA 108 (90 BASE) MCG/ACT IN AERS: 2.0000 | INHALATION_SPRAY | Freq: Four times a day (QID) | RESPIRATORY_TRACT | 2 refills | Status: AC | PRN

## 2018-09-09 NOTE — Telephone Encounter (Signed)
RN spoke with Rickey Barbara with [facility] to obtain refill on Albuterol refill.  Refill sent, Renee notified.

## 2018-09-09 NOTE — Telephone Encounter (Signed)
RN returned call to Rolland Bimler.  Voicemail left for return call.

## 2018-09-15 ENCOUNTER — Telehealth: Payer: Self-pay | Admitting: *Deleted

## 2018-09-15 NOTE — Telephone Encounter (Signed)
Received a call from [place] with [facility] [phone]) stating pt has increased shortness of breath and possibly needs a thoracentesis.  RN instructed Sheppard Coil that if pt breathing becomes worse, she will need to go to the ED for further evaluation.  RN will review with Dr. Lindi Adie and call patient.

## 2018-09-16 ENCOUNTER — Telehealth: Payer: Self-pay

## 2018-09-16 ENCOUNTER — Other Ambulatory Visit: Payer: Self-pay

## 2018-09-16 DIAGNOSIS — J910 Malignant pleural effusion: Secondary | ICD-10-CM

## 2018-09-16 NOTE — Telephone Encounter (Signed)
RN reviewed previous encounter with MD, MD recommends thoracentesis.     RN placed call to Rickey Barbara, RN @ [facility], voicemail left for update.   RN placed call to patient's direct number, voicemail full.  Placed call to patient's boyfriend to number provided, "wrong number."

## 2018-09-16 NOTE — Telephone Encounter (Signed)
RN placed calls again to numbers provided, no answer no options to leave voicemail.

## 2018-09-16 NOTE — Telephone Encounter (Signed)
RN spoke with patient's significant other, Jeneen Rinks.  James following up regarding scheduling thoracentesis.   RN scheduled patient for thoracentesis for Monday 9/14 @ 11am, scheduling scheduled Covid test for Saturday 9/12.  Information given to Jeneen Rinks, voiced appreciation.  RN educated significant other to utilize ED if needed over weekend for increase shortness of breath.  Verbalized understanding.

## 2018-09-17 ENCOUNTER — Encounter (HOSPITAL_COMMUNITY): Payer: Self-pay | Admitting: Emergency Medicine

## 2018-09-17 ENCOUNTER — Other Ambulatory Visit: Payer: Self-pay

## 2018-09-17 ENCOUNTER — Other Ambulatory Visit (HOSPITAL_COMMUNITY)
Admission: RE | Admit: 2018-09-17 | Discharge: 2018-09-17 | Disposition: A | Discharge status: Home / Self Care | Source: Ambulatory Visit | Attending: Hematology and Oncology | Admitting: Hematology and Oncology

## 2018-09-17 ENCOUNTER — Inpatient Hospital Stay (HOSPITAL_COMMUNITY)
Admission: EM | Admit: 2018-09-17 | Discharge: 2018-09-18 | DRG: 597 | Disposition: A | Discharge status: Hospice | Attending: Internal Medicine | Admitting: Internal Medicine

## 2018-09-17 ENCOUNTER — Emergency Department (HOSPITAL_COMMUNITY)

## 2018-09-17 ENCOUNTER — Inpatient Hospital Stay (HOSPITAL_COMMUNITY)

## 2018-09-17 DIAGNOSIS — J449 Chronic obstructive pulmonary disease, unspecified: Secondary | ICD-10-CM | POA: Diagnosis present

## 2018-09-17 DIAGNOSIS — I4510 Unspecified right bundle-branch block: Secondary | ICD-10-CM | POA: Diagnosis present

## 2018-09-17 DIAGNOSIS — M1990 Unspecified osteoarthritis, unspecified site: Secondary | ICD-10-CM | POA: Diagnosis present

## 2018-09-17 DIAGNOSIS — R0682 Tachypnea, not elsewhere classified: Secondary | ICD-10-CM

## 2018-09-17 DIAGNOSIS — G893 Neoplasm related pain (acute) (chronic): Secondary | ICD-10-CM | POA: Diagnosis present

## 2018-09-17 DIAGNOSIS — C773 Secondary and unspecified malignant neoplasm of axilla and upper limb lymph nodes: Secondary | ICD-10-CM | POA: Diagnosis present

## 2018-09-17 DIAGNOSIS — F17210 Nicotine dependence, cigarettes, uncomplicated: Secondary | ICD-10-CM | POA: Diagnosis present

## 2018-09-17 DIAGNOSIS — Z9012 Acquired absence of left breast and nipple: Secondary | ICD-10-CM | POA: Diagnosis not present

## 2018-09-17 DIAGNOSIS — Z20828 Contact with and (suspected) exposure to other viral communicable diseases: Secondary | ICD-10-CM | POA: Diagnosis present

## 2018-09-17 DIAGNOSIS — R000 Tachycardia, unspecified: Secondary | ICD-10-CM | POA: Diagnosis present

## 2018-09-17 DIAGNOSIS — J9602 Acute respiratory failure with hypercapnia: Secondary | ICD-10-CM | POA: Diagnosis present

## 2018-09-17 DIAGNOSIS — J910 Malignant pleural effusion: Secondary | ICD-10-CM | POA: Diagnosis present

## 2018-09-17 DIAGNOSIS — J9601 Acute respiratory failure with hypoxia: Secondary | ICD-10-CM | POA: Diagnosis present

## 2018-09-17 DIAGNOSIS — Z515 Encounter for palliative care: Secondary | ICD-10-CM | POA: Diagnosis not present

## 2018-09-17 DIAGNOSIS — Z79899 Other long term (current) drug therapy: Secondary | ICD-10-CM

## 2018-09-17 DIAGNOSIS — C50412 Malignant neoplasm of upper-outer quadrant of left female breast: Principal | ICD-10-CM | POA: Diagnosis present

## 2018-09-17 DIAGNOSIS — E162 Hypoglycemia, unspecified: Secondary | ICD-10-CM | POA: Diagnosis present

## 2018-09-17 DIAGNOSIS — I2699 Other pulmonary embolism without acute cor pulmonale: Secondary | ICD-10-CM | POA: Diagnosis present

## 2018-09-17 DIAGNOSIS — I110 Hypertensive heart disease with heart failure: Secondary | ICD-10-CM | POA: Diagnosis present

## 2018-09-17 DIAGNOSIS — C50919 Malignant neoplasm of unspecified site of unspecified female breast: Secondary | ICD-10-CM

## 2018-09-17 DIAGNOSIS — E876 Hypokalemia: Secondary | ICD-10-CM | POA: Diagnosis present

## 2018-09-17 DIAGNOSIS — J440 Chronic obstructive pulmonary disease with acute lower respiratory infection: Secondary | ICD-10-CM | POA: Diagnosis present

## 2018-09-17 DIAGNOSIS — F419 Anxiety disorder, unspecified: Secondary | ICD-10-CM | POA: Diagnosis present

## 2018-09-17 DIAGNOSIS — R072 Precordial pain: Secondary | ICD-10-CM | POA: Diagnosis present

## 2018-09-17 DIAGNOSIS — Z885 Allergy status to narcotic agent status: Secondary | ICD-10-CM | POA: Diagnosis not present

## 2018-09-17 DIAGNOSIS — R630 Anorexia: Secondary | ICD-10-CM | POA: Diagnosis present

## 2018-09-17 DIAGNOSIS — Z888 Allergy status to other drugs, medicaments and biological substances status: Secondary | ICD-10-CM | POA: Diagnosis not present

## 2018-09-17 DIAGNOSIS — R064 Hyperventilation: Secondary | ICD-10-CM

## 2018-09-17 DIAGNOSIS — C7801 Secondary malignant neoplasm of right lung: Secondary | ICD-10-CM | POA: Diagnosis present

## 2018-09-17 DIAGNOSIS — I10 Essential (primary) hypertension: Secondary | ICD-10-CM | POA: Diagnosis present

## 2018-09-17 DIAGNOSIS — Z66 Do not resuscitate: Secondary | ICD-10-CM | POA: Diagnosis present

## 2018-09-17 DIAGNOSIS — F329 Major depressive disorder, single episode, unspecified: Secondary | ICD-10-CM | POA: Diagnosis present

## 2018-09-17 DIAGNOSIS — Z803 Family history of malignant neoplasm of breast: Secondary | ICD-10-CM

## 2018-09-17 DIAGNOSIS — J189 Pneumonia, unspecified organism: Secondary | ICD-10-CM | POA: Diagnosis present

## 2018-09-17 DIAGNOSIS — R1110 Vomiting, unspecified: Secondary | ICD-10-CM | POA: Diagnosis present

## 2018-09-17 DIAGNOSIS — R590 Localized enlarged lymph nodes: Secondary | ICD-10-CM | POA: Diagnosis present

## 2018-09-17 DIAGNOSIS — Z9981 Dependence on supplemental oxygen: Secondary | ICD-10-CM

## 2018-09-17 DIAGNOSIS — Z7982 Long term (current) use of aspirin: Secondary | ICD-10-CM

## 2018-09-17 DIAGNOSIS — Z9221 Personal history of antineoplastic chemotherapy: Secondary | ICD-10-CM

## 2018-09-17 LAB — COMPREHENSIVE METABOLIC PANEL
ALT: 8 U/L (ref 0–44)
AST: 13 U/L — ABNORMAL LOW (ref 15–41)
Albumin: 2.4 g/dL — ABNORMAL LOW (ref 3.5–5.0)
Alkaline Phosphatase: 56 U/L (ref 38–126)
Anion gap: 17 — ABNORMAL HIGH (ref 5–15)
BUN: <5 mg/dL — ABNORMAL LOW (ref 8–23)
CO2: 33 mmol/L — ABNORMAL HIGH (ref 22–32)
Calcium: 8.7 mg/dL — ABNORMAL LOW (ref 8.9–10.3)
Chloride: 90 mmol/L — ABNORMAL LOW (ref 98–111)
Creatinine, Ser: 0.57 mg/dL (ref 0.44–1.00)
GFR calc Af Amer: >60 mL/min (ref >60)
GFR calc non Af Amer: >60 mL/min (ref >60)
Glucose, Bld: 77 mg/dL (ref 70–99)
Potassium: 3.2 mmol/L — ABNORMAL LOW (ref 3.5–5.1)
Sodium: 140 mmol/L (ref 135–145)
Total Bilirubin: 1.4 mg/dL — ABNORMAL HIGH (ref 0.3–1.2)
Total Protein: 5.5 g/dL — ABNORMAL LOW (ref 6.5–8.1)

## 2018-09-17 LAB — CBG MONITORING, ED
Glucose-Capillary: 62 mg/dL — ABNORMAL LOW (ref 70–99)
Glucose-Capillary: 76 mg/dL (ref 70–99)

## 2018-09-17 LAB — POCT I-STAT 7, (LYTES, BLD GAS, ICA,H+H)
Acid-Base Excess: 15 mmol/L — ABNORMAL HIGH (ref 0.0–2.0)
Bicarbonate: 40.6 mmol/L — ABNORMAL HIGH (ref 20.0–28.0)
Calcium, Ion: 1.16 mmol/L (ref 1.15–1.40)
HCT: 34 % — ABNORMAL LOW (ref 36.0–46.0)
Hemoglobin: 11.6 g/dL — ABNORMAL LOW (ref 12.0–15.0)
O2 Saturation: 94 %
Patient temperature: 98.7
Potassium: 3 mmol/L — ABNORMAL LOW (ref 3.5–5.1)
Sodium: 139 mmol/L (ref 135–145)
TCO2: 42 mmol/L — ABNORMAL HIGH (ref 22–32)
pCO2 arterial: 55.6 mmHg — ABNORMAL HIGH (ref 32.0–48.0)
pH, Arterial: 7.472 — ABNORMAL HIGH (ref 7.350–7.450)
pO2, Arterial: 68 mmHg — ABNORMAL LOW (ref 83.0–108.0)

## 2018-09-17 LAB — CBC
HCT: 35.9 % — ABNORMAL LOW (ref 36.0–46.0)
Hemoglobin: 10.1 g/dL — ABNORMAL LOW (ref 12.0–15.0)
MCH: 26.2 pg (ref 26.0–34.0)
MCHC: 28.1 g/dL — ABNORMAL LOW (ref 30.0–36.0)
MCV: 93 fL (ref 80.0–100.0)
Platelets: 373 10*3/uL (ref 150–400)
RBC: 3.86 MIL/uL — ABNORMAL LOW (ref 3.87–5.11)
RDW: 18.7 % — ABNORMAL HIGH (ref 11.5–15.5)
WBC: 5.6 10*3/uL (ref 4.0–10.5)
nRBC: 0 % (ref 0.0–0.2)

## 2018-09-17 LAB — BRAIN NATRIURETIC PEPTIDE: B Natriuretic Peptide: 78.4 pg/mL (ref 0.0–100.0)

## 2018-09-17 LAB — TROPONIN I (HIGH SENSITIVITY)
Troponin I (High Sensitivity): 8 ng/L (ref <18)
Troponin I (High Sensitivity): 9 ng/L (ref <18)

## 2018-09-17 LAB — SARS CORONAVIRUS 2 BY RT PCR (HOSPITAL ORDER, PERFORMED IN ~~LOC~~ HOSPITAL LAB): SARS Coronavirus 2: NEGATIVE

## 2018-09-17 MED ORDER — THIAMINE HCL 100 MG/ML IJ SOLN
100.0000 mg | Freq: Once | INTRAMUSCULAR | Status: AC
  Administered 2018-09-17: 21:00:00 100 mg via INTRAVENOUS
  Filled 2018-09-17: qty 2

## 2018-09-17 MED ORDER — ALBUTEROL SULFATE (2.5 MG/3ML) 0.083% IN NEBU: 2.5000 mg | INHALATION_SOLUTION | RESPIRATORY_TRACT | Status: DC | PRN

## 2018-09-17 MED ORDER — POTASSIUM CHLORIDE CRYS ER 20 MEQ PO TBCR: 40.0000 meq | EXTENDED_RELEASE_TABLET | Freq: Once | ORAL | Status: DC

## 2018-09-17 MED ORDER — LORAZEPAM 2 MG/ML IJ SOLN
1.0000 mg | Freq: Once | INTRAMUSCULAR | Status: AC
  Administered 2018-09-17: 1 mg via INTRAVENOUS
  Filled 2018-09-17: qty 1

## 2018-09-17 MED ORDER — MORPHINE SULFATE (PF) 2 MG/ML IV SOLN
2.0000 mg | Freq: Once | INTRAVENOUS | Status: AC
  Administered 2018-09-18: 06:00:00 1 mg via INTRAVENOUS
  Filled 2018-09-17: qty 1

## 2018-09-17 MED ORDER — ACETAMINOPHEN 650 MG RE SUPP: 650.0000 mg | Freq: Four times a day (QID) | RECTAL | Status: DC | PRN

## 2018-09-17 MED ORDER — LORAZEPAM 2 MG/ML IJ SOLN
0.5000 mg | Freq: Four times a day (QID) | INTRAMUSCULAR | Status: DC | PRN
  Administered 2018-09-18 (×2): 0.5 mg via INTRAVENOUS
  Filled 2018-09-17 (×2): qty 1

## 2018-09-17 MED ORDER — DEXTROSE-NACL 5-0.45 % IV SOLN
INTRAVENOUS | Status: DC
  Administered 2018-09-17 – 2018-09-18 (×2): via INTRAVENOUS

## 2018-09-17 MED ORDER — POTASSIUM CHLORIDE 10 MEQ/100ML IV SOLN
10.0000 meq | INTRAVENOUS | Status: AC
  Administered 2018-09-18 (×4): 10 meq via INTRAVENOUS
  Filled 2018-09-17 (×2): qty 100

## 2018-09-17 MED ORDER — HYDRALAZINE HCL 20 MG/ML IJ SOLN: 5.0000 mg | INTRAMUSCULAR | Status: DC | PRN

## 2018-09-17 MED ORDER — ENOXAPARIN SODIUM 40 MG/0.4ML ~~LOC~~ SOLN: 40.0000 mg | Freq: Every day | SUBCUTANEOUS | Status: DC

## 2018-09-17 MED ORDER — ASPIRIN 81 MG PO CHEW: 324.0000 mg | CHEWABLE_TABLET | Freq: Once | ORAL | Status: DC

## 2018-09-17 MED ORDER — ACETAMINOPHEN 325 MG PO TABS: 650.0000 mg | ORAL_TABLET | Freq: Four times a day (QID) | ORAL | Status: DC | PRN

## 2018-09-17 MED ORDER — IPRATROPIUM-ALBUTEROL 0.5-2.5 (3) MG/3ML IN SOLN
3.0000 mL | Freq: Four times a day (QID) | RESPIRATORY_TRACT | Status: DC
  Administered 2018-09-18 (×2): 3 mL via RESPIRATORY_TRACT
  Filled 2018-09-17 (×3): qty 3

## 2018-09-17 NOTE — ED Notes (Signed)
Pt rouses to voice and touch but returns to letargic responses. Husband at bedside

## 2018-09-17 NOTE — ED Triage Notes (Signed)
Pt here from home c.o. centralized chest pressure, cough that is chronic but worse, emesis w decreased appetite X 4 days, and edema to legs. Pt is a hospice patient.

## 2018-09-17 NOTE — ED Notes (Signed)
Lab to add on BNP.  °

## 2018-09-17 NOTE — ED Notes (Signed)
Pt given Ativan due to consistently pulling off leads and becoming agitated. Transporting to CT now

## 2018-09-17 NOTE — H&P (Addendum)
History and Physical    Deborah Mcintyre PPJ:[id number] DOB: 1955-07-05 DOA: 09/17/2018  PCP: [name], MD Patient coming from: Home  Chief Complaint: Shortness of breath, chest pain  HPI: Deborah Mcintyre is a 63 y.o. female with medical history significant of metastatic breast cancer with malignant recurrent pleural effusion, recently diagnosed PE, currently a hospice patient, COPD, anxiety, depression, hypertension presenting to the hospital for evaluation of chest pain and shortness of breath.  Patient reports having shortness of breath and chest pain for the past 3 days.  States the chest pain is substernal and worse when taking deep breaths.  It has been intermittent over the past 3 days.  She has also been coughing.  Denies fevers or chills.  States she stopped taking her blood thinner 3 weeks ago as instructed by her oncologist.  States she is not eating anything.  No additional history could be obtained from her at this time.  ED Course: Tachycardic and tachypneic.  Requiring 6 L supplemental oxygen.  ABG showing pH 7.47, PCO2 55, and PO2 68.  SARS-CoV-2 test negative.  CBG 62.  No leukocytosis.  Hemoglobin 10.1, above baseline.  Platelet count normal.  Potassium 3.2.  Bicarb 33, anion gap 17.  Creatinine 0.5, at baseline.  T bili 1.4, remainder of LFTs normal.  High-sensitivity troponin checked twice negative.  BNP normal.  Chest x-ray showing increased right lung interstitial opacities and probable trace right pleural effusion.  Unchanged left pleural effusion and diffuse left lung opacities. Started on D5-1/2 normal saline infusion.  Review of Systems:  All systems reviewed and apart from history of presenting illness, are negative.  Past Medical History:  Diagnosis Date  . Anxiety   . Arthritis   . Bronchitis   . COPD (chronic obstructive pulmonary disease) ([place])   . Depression   . Hypertension   . Malignant neoplasm of upper-outer quadrant of left female breast ([place])  01/04/2015  . Nocturia   . Numbness and tingling    toes - bilateral  . Pneumonia   . PONV (postoperative nausea and vomiting)    Nausea    Past Surgical History:  Procedure Laterality Date  . BREAST LUMPECTOMY Bilateral    x3  . BREAST LUMPECTOMY WITH RADIOACTIVE SEED LOCALIZATION Right 08/23/2015   Procedure: RIGHT BREAST LUMPECTOMY WITH RADIOACTIVE SEED LOCALIZATION;  Surgeon: Alphonsa Overall, MD;  Location: [facility];  Service: General;  Laterality: Right;  . CESAREAN SECTION     x4  . IR GUIDED DRAIN W CATHETER PLACEMENT  06/02/2018  . IR IMAGING GUIDED PORT INSERTION  05/25/2018  . IR REMOVAL OF PLURAL CATH W/CUFF  07/28/2018  . IR REMOVAL TUN ACCESS W/ PORT W/O FL MOD SED  10/20/2016  . IR THORACENTESIS ASP PLEURAL SPACE W/IMG GUIDE  05/12/2018  . IR THORACENTESIS ASP PLEURAL SPACE W/IMG GUIDE  07/07/2018  . MASTECTOMY Left 08/23/2015    RIGHT BREAST LUMPECTOMY WITH RADIOACTIVE SEED LOCALIZATION,  LEFT MASTECTOMY WITH AXILLARY LYMPH NODE DISSECTION  . MASTECTOMY WITH AXILLARY LYMPH NODE DISSECTION Left 08/23/2015   Procedure: LEFT MASTECTOMY WITH AXILLARY LYMPH NODE DISSECTION;  Surgeon: Alphonsa Overall, MD;  Location: [facility];  Service: General;  Laterality: Left;  [name] MULTIPLE TOOTH EXTRACTIONS       reports that she has been smoking cigarettes. She has a 33.75 pack-year smoking history. She has never used smokeless tobacco. She reports current alcohol use. She reports that she does not use drugs.  Allergies  Allergen Reactions  . Fentanyl  Nausea And Vomiting and Other (See Comments)    Headache  . Morphine And Related Nausea And Vomiting and Other (See Comments)    Headache    Family History  Problem Relation Age of Onset  . Breast cancer Maternal Aunt     Prior to Admission medications   Medication Sig Start Date End Date Taking? Authorizing Provider  albuterol (PROVENTIL) (2.5 MG/3ML) 0.083% nebulizer solution Take 3 mLs (2.5 mg total) by nebulization every 6 (six) hours as needed  for up to 30 days for wheezing or shortness of breath. 07/02/18 08/06/18  Alma Friendly, MD  albuterol (VENTOLIN HFA) 108 (90 Base) MCG/ACT inhaler Inhale 2 puffs into the lungs every 6 (six) hours as needed for wheezing or shortness of breath. 09/09/18   Nicholas Lose, MD  apixaban (ELIQUIS) 5 MG TABS tablet Take 1 tablet (5 mg total) by mouth 2 (two) times daily. 08/02/18 10/01/18  Cherylann Ratel A, DO  aspirin EC 81 MG tablet Take 81 mg by mouth daily.    [provider]  dronabinol (MARINOL) 2.5 MG capsule Take 1 capsule (2.5 mg total) by mouth 2 (two) times daily before a meal. 07/21/18   Causey, Charlestine Massed, NP  gabapentin (NEURONTIN) 400 MG capsule Take 1 capsule (400 mg total) by mouth 3 (three) times daily. 07/17/18   Kayleen Memos, DO  lidocaine-prilocaine (EMLA) cream Apply to affected area once Patient taking differently: Apply 1 application topically daily as needed (port access).  05/19/18   Nicholas Lose, MD  metoprolol tartrate (LOPRESSOR) 25 MG tablet Take 0.5 tablets (12.5 mg total) by mouth 2 (two) times daily. 08/04/18 09/03/18  Donne Hazel, MD  Multiple Vitamin (MULTIVITAMIN WITH MINERALS) TABS tablet Take 1 tablet by mouth daily. 07/18/18   Kayleen Memos, DO  nicotine (NICODERM CQ - DOSED IN MG/24 HOURS) 21 mg/24hr patch Place 1 patch (21 mg total) onto the skin daily. 08/02/18   Cherylann Ratel A, DO  ondansetron (ZOFRAN) 8 MG tablet Take 1 tablet (8 mg total) by mouth every 8 (eight) hours as needed for nausea. 09/23/17   Nicholas Lose, MD  oxyCODONE-acetaminophen (PERCOCET/ROXICET) 5-325 MG tablet Take 1 tablet by mouth every 4 (four) hours as needed for severe pain.    [provider]  PARoxetine (PAXIL) 20 MG tablet Take 1 tablet (20 mg total) by mouth 2 (two) times daily. 08/02/18 10/01/18  Cherylann Ratel A, DO  potassium chloride (K-DUR) 10 MEQ tablet Take 2 tablets (20 mEq total) by mouth daily. 07/21/18   Gardenia Phlegm, NP  PREMARIN vaginal cream  INSERT 1 APPLICATORFUL VAGINALLY DAILY Patient taking differently: Place 1 Applicatorful vaginally daily as needed (dryness).  04/19/17   Nicholas Lose, MD  prochlorperazine (COMPAZINE) 10 MG tablet Take 1 tablet (10 mg total) by mouth every 6 (six) hours as needed (Nausea or vomiting). Patient taking differently: Take 10 mg by mouth every 6 (six) hours as needed for nausea or vomiting.  05/19/18   Nicholas Lose, MD  promethazine (PHENERGAN) 12.5 MG tablet TAKE 1 TABLET(12.5 MG) BY MOUTH EVERY 6 HOURS AS NEEDED FOR NAUSEA OR VOMITING 09/01/18   Nicholas Lose, MD    Physical Exam: Vitals:   09/17/18 2115 09/17/18 2130 09/17/18 2135 09/17/18 2245  BP: 137/76 (!) 104/93 (!) 104/93 (!) 143/86  Pulse:   (!) 119 (!) 122  Resp:   (!) 22 (!) 24  Temp:      TempSrc:      SpO2:  100% 99%  Weight:      Height:        Physical Exam  Constitutional: She is oriented to person, place, and time.  HENT:  Head: Normocephalic.  Eyes: Right eye exhibits no discharge. Left eye exhibits no discharge.  Neck: Neck supple.  Cardiovascular: Regular rhythm and intact distal pulses.  Tachycardic  Pulmonary/Chest: She is in respiratory distress. She has no wheezes.  Examination limited due to lack of patient cooperation On 6 L supplemental oxygen Tachypneic Anterior lung fields auscultated Breath sounds diminished in the left lung field compared to the right  Abdominal: Soft. Bowel sounds are normal. She exhibits no distension. There is no abdominal tenderness. There is no guarding.  Musculoskeletal:        General: No edema.  Neurological: She is alert and oriented to person, place, and time.  Skin: Skin is warm and dry. She is not diaphoretic.     Labs on Admission: I have personally reviewed following labs and imaging studies  CBC: Recent Labs  Lab 09/17/18 1800 09/17/18 1858  WBC 5.6  --   HGB 10.1* 11.6*  HCT 35.9* 34.0*  MCV 93.0  --   PLT 373  --    Basic Metabolic Panel: Recent Labs   Lab 09/17/18 1800 09/17/18 1858  NA 140 139  K 3.2* 3.0*  CL 90*  --   CO2 33*  --   GLUCOSE 77  --   BUN <5*  --   CREATININE 0.57  --   CALCIUM 8.7*  --    GFR: Estimated Creatinine Clearance: 71.4 mL/min (by C-G formula based on SCr of 0.57 mg/dL). Liver Function Tests: Recent Labs  Lab 09/17/18 1800  AST 13*  ALT 8  ALKPHOS 56  BILITOT 1.4*  PROT 5.5*  ALBUMIN 2.4*   No results for input(s): LIPASE, AMYLASE in the last 168 hours. No results for input(s): AMMONIA in the last 168 hours. Coagulation Profile: No results for input(s): INR, PROTIME in the last 168 hours. Cardiac Enzymes: No results for input(s): CKTOTAL, CKMB, CKMBINDEX, TROPONINI in the last 168 hours. BNP (last 3 results) No results for input(s): PROBNP in the last 8760 hours. HbA1C: No results for input(s): HGBA1C in the last 72 hours. CBG: Recent Labs  Lab 09/17/18 1730 09/17/18 2040  GLUCAP 62* 76   Lipid Profile: No results for input(s): CHOL, HDL, LDLCALC, TRIG, CHOLHDL, LDLDIRECT in the last 72 hours. Thyroid Function Tests: No results for input(s): TSH, T4TOTAL, FREET4, T3FREE, THYROIDAB in the last 72 hours. Anemia Panel: No results for input(s): VITAMINB12, FOLATE, FERRITIN, TIBC, IRON, RETICCTPCT in the last 72 hours. Urine analysis:    Component Value Date/Time   COLORURINE YELLOW 12/06/2014 2207   APPEARANCEUR CLOUDY (A) 12/06/2014 2207   LABSPEC 1.025 12/06/2014 2207   PHURINE 5.5 12/06/2014 2207   GLUCOSEU NEGATIVE 12/06/2014 2207   HGBUR MODERATE (A) 12/06/2014 2207   BILIRUBINUR NEGATIVE 12/06/2014 2207   KETONESUR NEGATIVE 12/06/2014 2207   PROTEINUR NEGATIVE 12/06/2014 2207   UROBILINOGEN 1.0 07/30/2013 1527   NITRITE POSITIVE (A) 12/06/2014 2207   LEUKOCYTESUR SMALL (A) 12/06/2014 2207    Radiological Exams on Admission: Dg Chest Portable 1 View  Result Date: 09/17/2018 CLINICAL DATA:  Acute shortness of breath. History of breast and lung cancer. EXAM: PORTABLE  CHEST 1 VIEW COMPARISON:  08/06/2018 and prior radiographs FINDINGS: LEFT pleural effusion and LEFT lung opacities are unchanged. Increased interstitial opacities within the RIGHT lung are noted. Probable trace RIGHT  pleural effusion noted. A RIGHT Port-A-Cath is again identified with tip overlying the LOWER SVC. No pneumothorax. IMPRESSION: Increased RIGHT lung interstitial opacities, favor edema over infection. Probable trace RIGHT pleural effusion. Unchanged LEFT pleural effusion and diffuse LEFT lung opacities. Electronically Signed   By: Margarette Canada M.D.   On: 09/17/2018 18:00    EKG: Independently reviewed.  Sinus tachycardia, heart rate 125. RBBB and LAFB.  Assessment/Plan Principal Problem:   Acute respiratory failure with hypoxia and hypercapnia (HCC) Active Problems:   Hypertension   COPD (chronic obstructive pulmonary disease) (HCC)   Hypokalemia   Hypoglycemia   Acute hypoxic hypercapnic respiratory failure in the setting of recurrent malignant left pleural effusion and PE diagnosed 07/27/2018 currently not on anticoagulation Patient is currently under hospice.  Presenting with complaints of shortness of breath and pleuritic chest pain.  She told nursing staff that she uses 4 L supplemental oxygen at home.  Placed on 6 L supplemental oxygen in the ED.  Tachycardic and tachypneic. ABG showing pH 7.47, PCO2 55, and PO2 68. Chest x-ray showing increased right lung interstitial opacities and probable trace right pleural effusion.  Unchanged left pleural effusion and diffuse left lung opacities.  Pneumonia less likely given no fever or leukocytosis.  SARS-CoV-2 test negative.  Pulmonary edema less likely given normal BNP.  She has had multiple recent hospitalizations for dyspnea and chest pain.  During her recent hospitalization 7/22-7/30 her Pleurx drain was removed as it was no longer functioning.  She was then admitted again 8/1-8/2 and underwent thoracentesis, only 300 cc could be removed  due to loculation.  Case was discussed with cardiothoracic surgery during her recent hospitalization to see if there was anything they could offer the patient with recurrent malignant pleural effusion, however, with the patient's recent diagnosis of PE and other comorbidities patient was not a surgical candidate.  -Monitor closely in the progressive care unit - I called and spoke to Dr. Reesa Chew today, IR will attempt to do thoracentesis tomorrow morning although it will be difficult given loculation. -Started empirically on heparin given known history of recent PE and patient not being on anticoagulation.  Stat CT angiogram has been ordered. -Nonrebreather was tried but patient is not able to tolerate it due to anxiety.  Ativan has been ordered for anxiety/air hunger.  Continue supplemental oxygen. -Repeat ABG in a few hours -Continuous pulse ox -Check procalcitonin level  Addendum: CT angiogram showing: IMPRESSION: Small amount of residual pulmonary embolus in the right lower lobe. Persistent elevation of the RV/LV ratio and enlarged central pulmonary arteries. May reflect component of right heart strain mixed with chronic right heart failure Increase in size of the loculated right pleural effusion following removal of the Pleurx catheter seen previously. Extensive left pleural thickening. Increasing masslike consolidative opacities throughout both lungs with adjacent ground-glass concerning multifocal pneumonia given rapid progression. Follow-up imaging is recommended to exclude underlying malignancy/metastasis. New truncation of the left upper lobe airways and pulmonary arteries entering the airless lung. Heterogeneous attenuation the lung parenchyma is concerning for superinfection and/or necrosis. Extensive mediastinal, hilar upper abdominal adenopathy, reference nodes detailed above.  -High-sensitivity troponin x2 negative.  Echocardiogram has been ordered.  Discussed with Dr. James Ivanoff.   PCCM team will see the patient. -Continue anticoagulation with heparin -Started on broad-spectrum antibiotics for multifocal pneumonia -Discussed with Dr. Caffie Pinto from cardiothoracic surgery who informed me that the patient is not a surgical candidate.   Hypoglycemia Patient has anorexia in the setting of advanced malignancy and comorbidities.  CBG 62 and was started on D5-1 half normal saline infusion. -Continue D5's-one half normal saline -CV checks every 2 hours  Mild hypokalemia Potassium 3.2. -Replete potassium.  Check magnesium level.  Continue to monitor electrolytes and replete as needed.  COPD -No wheezing at present.  Duo nebs every 6 hours and albuterol PRN.  Anxiety -Ativan PRN  Hypertension -Currently normotensive.  Hydralazine PRN.  Goals of care discussion I tried having a goals of care discussion with the patient but she became upset and did not want to talk.  She wishes to be full code. -Palliative care consult.  Pharmacy med rec pending.  DVT prophylaxis: Heparin Code Status: Patient wishes to be full code. Family Communication: No family available at this time. Disposition Plan: Anticipate discharge to hospice. Consults called: IR, palliative care Admission status: It is my clinical opinion that admission to INPATIENT is reasonable and necessary in this 63 y.o. female . presenting with Acute hypoxic hypercapnic respiratory failure in the setting of recurrent malignant left pleural effusion and PE diagnosed 07/27/2018 currently not on anticoagulation.  She is in respiratory distress and at very high risk for decompensation.  Given the aforementioned, the predictability of an adverse outcome is felt to be significant. I expect that the patient will require at least 2 midnights in the hospital to treat this condition.   The medical decision making on this patient was of high complexity and the patient is at high risk for clinical deterioration, therefore this is  a level 3 visit.  Shela Leff MD [facility] Pager [phone]  If 7PM-7AM, please contact night-coverage [email or web address] Password [facility]  09/17/2018, 11:27 PM

## 2018-09-17 NOTE — ED Provider Notes (Signed)
[facility] EMERGENCY DEPARTMENT Provider Note   CSN: [id number] Arrival date & time: 09/17/18  1652     History   Chief Complaint Chief Complaint  Patient presents with  . Emesis  . Cough  . Abdominal Pain    HPI Deborah Mcintyre is a 63 y.o. female who presents emergency department for chest pain and shortness of breath.  The patient has history of metastatic breast cancer.  She is currently a hospice patient.  Review of EMR shows that she has had multiple hospitalizations recently.  She was diagnosed recently with pulmonary emboli and is on a blood thinner.  The patient during her last hospitalization last week had failure of her Pleurx catheter which she was using for a recurrent malignant pleural effusion.  She is also deemed not to be a good surgical candidate that for VATS procedure.  Review of EMR shows that she is also called her hospice nurse complaining of worsening chest pain and shortness of breath was set up for a COVID test today and an outpatient thoracentesis is coming Monday.  The patient states to me that she has had 3 days of worsening chest pain or shortness of breath.  She states "I think I am having a heart attack."  She has also had some swelling in her ankles.  She denies fevers chills.  She is a chronic cough.  She denies coughing up blood.  Patient states that she did try to call her hospice nurse but states that "she just did not call me back."  I discussed goals of care with the patient's hospice nurse.  She is apparently currently still full code.  Hospice nurse says that they have had multiple conversations with the patient and her family who apparently are not grasping the full extent of her illness and her impending death and still want to be full code.           Past Medical History:  Diagnosis Date  . Anxiety   . Arthritis   . Bronchitis   . COPD (chronic obstructive pulmonary disease) ([place])   . Depression   . Hypertension   .  Malignant neoplasm of upper-outer quadrant of left female breast ([place]) 01/04/2015  . Nocturia   . Numbness and tingling    toes - bilateral  . Pneumonia   . PONV (postoperative nausea and vomiting)    Nausea    Patient Active Problem List   Diagnosis Date Noted  . Cancer associated pain   . Weakness generalized   . Palliative care by specialist   . Malnutrition of moderate degree 07/29/2018  . Metastatic breast cancer ([place])   . Pulmonary emboli ([place]) 07/27/2018  . HCAP (healthcare-associated pneumonia) 07/14/2018  . Pleural effusion on left 06/30/2018  . Hypokalemia 06/30/2018  . Port-A-Cath in place 06/24/2018  . Status post chest tube placement ([place])   . Tobacco abuse 06/01/2018  . Chest pain 06/01/2018  . Elevated troponin 06/01/2018  . Cigarette smoker 05/27/2018  . DNR (do not resuscitate) discussion 05/19/2018  . 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  . Hypertension 05/12/2018  . COPD (chronic obstructive pulmonary disease) ([place]) 05/12/2018  . Anxiety and depression 05/12/2018  . Community acquired pneumonia of left lung 02/04/2017  . Antineoplastic chemotherapy induced pancytopenia ([place]) 04/26/2015  . Antineoplastic chemotherapy induced anemia 04/05/2015  . Breast cancer of upper-outer quadrant of left female breast ([place]) 01/01/2015  . Acute exacerbation of chronic bronchitis ([place]) 08/03/2013    Past Surgical History:  Procedure Laterality Date  . BREAST LUMPECTOMY Bilateral    x3  . BREAST LUMPECTOMY WITH RADIOACTIVE SEED LOCALIZATION Right 08/23/2015   Procedure: RIGHT BREAST LUMPECTOMY WITH RADIOACTIVE SEED LOCALIZATION;  Surgeon: Alphonsa Overall, MD;  Location: [facility];  Service: General;  Laterality: Right;  . CESAREAN SECTION     x4  . IR GUIDED DRAIN W CATHETER PLACEMENT  06/02/2018  . IR IMAGING GUIDED PORT INSERTION  05/25/2018  . IR REMOVAL OF PLURAL CATH W/CUFF  07/28/2018  . IR REMOVAL TUN ACCESS W/ PORT W/O FL MOD SED  10/20/2016  . IR THORACENTESIS ASP  PLEURAL SPACE W/IMG GUIDE  05/12/2018  . IR THORACENTESIS ASP PLEURAL SPACE W/IMG GUIDE  07/07/2018  . MASTECTOMY Left 08/23/2015    RIGHT BREAST LUMPECTOMY WITH RADIOACTIVE SEED LOCALIZATION,  LEFT MASTECTOMY WITH AXILLARY LYMPH NODE DISSECTION  . MASTECTOMY WITH AXILLARY LYMPH NODE DISSECTION Left 08/23/2015   Procedure: LEFT MASTECTOMY WITH AXILLARY LYMPH NODE DISSECTION;  Surgeon: Alphonsa Overall, MD;  Location: [facility];  Service: General;  Laterality: Left;  [name] MULTIPLE TOOTH EXTRACTIONS       OB History   No obstetric history on file.      Home Medications    Prior to Admission medications   Medication Sig Start Date End Date Taking? Authorizing Provider  albuterol (PROVENTIL) (2.5 MG/3ML) 0.083% nebulizer solution Take 3 mLs (2.5 mg total) by nebulization every 6 (six) hours as needed for up to 30 days for wheezing or shortness of breath. 07/02/18 08/06/18  Alma Friendly, MD  albuterol (VENTOLIN HFA) 108 (90 Base) MCG/ACT inhaler Inhale 2 puffs into the lungs every 6 (six) hours as needed for wheezing or shortness of breath. 09/09/18   Nicholas Lose, MD  apixaban (ELIQUIS) 5 MG TABS tablet Take 1 tablet (5 mg total) by mouth 2 (two) times daily. 08/02/18 10/01/18  Cherylann Ratel A, DO  aspirin EC 81 MG tablet Take 81 mg by mouth daily.    [provider]  dronabinol (MARINOL) 2.5 MG capsule Take 1 capsule (2.5 mg total) by mouth 2 (two) times daily before a meal. 07/21/18   Causey, Charlestine Massed, NP  gabapentin (NEURONTIN) 400 MG capsule Take 1 capsule (400 mg total) by mouth 3 (three) times daily. 07/17/18   Kayleen Memos, DO  lidocaine-prilocaine (EMLA) cream Apply to affected area once Patient taking differently: Apply 1 application topically daily as needed (port access).  05/19/18   Nicholas Lose, MD  metoprolol tartrate (LOPRESSOR) 25 MG tablet Take 0.5 tablets (12.5 mg total) by mouth 2 (two) times daily. 08/04/18 09/03/18  Donne Hazel, MD  Multiple Vitamin (MULTIVITAMIN WITH  MINERALS) TABS tablet Take 1 tablet by mouth daily. 07/18/18   Kayleen Memos, DO  nicotine (NICODERM CQ - DOSED IN MG/24 HOURS) 21 mg/24hr patch Place 1 patch (21 mg total) onto the skin daily. 08/02/18   Cherylann Ratel A, DO  ondansetron (ZOFRAN) 8 MG tablet Take 1 tablet (8 mg total) by mouth every 8 (eight) hours as needed for nausea. 09/23/17   Nicholas Lose, MD  oxyCODONE-acetaminophen (PERCOCET/ROXICET) 5-325 MG tablet Take 1 tablet by mouth every 4 (four) hours as needed for severe pain.    [provider]  PARoxetine (PAXIL) 20 MG tablet Take 1 tablet (20 mg total) by mouth 2 (two) times daily. 08/02/18 10/01/18  Cherylann Ratel A, DO  potassium chloride (K-DUR) 10 MEQ tablet Take 2 tablets (20 mEq total) by mouth daily. 07/21/18   Wilber Bihari  Cornetto, NP  PREMARIN vaginal cream INSERT 1 APPLICATORFUL VAGINALLY DAILY Patient taking differently: Place 1 Applicatorful vaginally daily as needed (dryness).  04/19/17   Nicholas Lose, MD  prochlorperazine (COMPAZINE) 10 MG tablet Take 1 tablet (10 mg total) by mouth every 6 (six) hours as needed (Nausea or vomiting). Patient taking differently: Take 10 mg by mouth every 6 (six) hours as needed for nausea or vomiting.  05/19/18   Nicholas Lose, MD  promethazine (PHENERGAN) 12.5 MG tablet TAKE 1 TABLET(12.5 MG) BY MOUTH EVERY 6 HOURS AS NEEDED FOR NAUSEA OR VOMITING 09/01/18   Nicholas Lose, MD    Family History Family History  Problem Relation Age of Onset  . Breast cancer Maternal Aunt     Social History Social History   Tobacco Use  . Smoking status: Current Every Day Smoker    Packs/day: 0.75    Years: 45.00    Pack years: 33.75    Types: Cigarettes  . Smokeless tobacco: Never Used  Substance Use Topics  . Alcohol use: Yes    Comment: ocassionally  . Drug use: No     Allergies   Fentanyl and Morphine and related   Review of Systems Review of Systems Ten systems reviewed and are negative for acute change, except as noted  in the HPI.    Physical Exam Updated Vital Signs BP 137/82 (BP Location: Right Arm)   Pulse (!) 124   Resp (!) 24   SpO2 96%   Physical Exam Vitals signs and nursing note reviewed.  Constitutional:      General: She is not in acute distress.    Appearance: She is well-developed and underweight. She is ill-appearing. She is not diaphoretic.  HENT:     Head: Normocephalic and atraumatic.  Eyes:     General: No scleral icterus.    Conjunctiva/sclera: Conjunctivae normal.  Neck:     Musculoskeletal: Normal range of motion.  Cardiovascular:     Rate and Rhythm: Normal rate and regular rhythm.     Heart sounds: Normal heart sounds. No murmur. No friction rub. No gallop.   Pulmonary:     Effort: Pulmonary effort is normal. Tachypnea present. No respiratory distress.     Breath sounds: Wheezing and rhonchi present.  Abdominal:     General: Bowel sounds are normal. There is no distension.     Palpations: Abdomen is soft. There is no mass.     Tenderness: There is no abdominal tenderness. There is no guarding.  Musculoskeletal:     Right lower leg: Edema present.     Left lower leg: Edema present.  Skin:    General: Skin is warm and dry.  Neurological:     Mental Status: She is alert and oriented to person, place, and time.  Psychiatric:        Behavior: Behavior normal. Behavior is cooperative.      ED Treatments / Results  Labs (all labs ordered are listed, but only abnormal results are displayed) Labs Reviewed - No data to display  EKG None  Radiology No results found.  Procedures Procedures (including critical care time)  Medications Ordered in ED Medications - No data to display   Initial Impression / Assessment and Plan / ED Course  I have reviewed the triage vital signs and the nursing notes.  Pertinent labs & imaging results that were available during my care of the patient were reviewed by me and considered in my medical decision making (see chart for  details).        Complicated 63 year old female hospice patient with metastatic breast cancer.  Patient is resistant to discussion of end-of-life care although she does state that she knows she is dying.  She has continued to demand to be full code..  I had an extensive discussion with the patient's hospice care nurse who has confirmed that they have had multiple conversations with the patient however apparently her hospice care provider allows continued hospice care even if the patient demands to be DNR.  The patient reports that she was taken off of her anticoagulation.  Husband also confirms this.  They state that this was done by other oncologist however neither of them are able to tell me why.  Patient continues to have significantly labored breathing, air hunger and that is primarily the reason she came for help today.  Patient has required continued oxygen supplementation.  I reviewed the patient's labs.  Her blood gas shows no hypercarbia.  I suspect although this is supposed to be an an arterial sample her partial oxygen pressure is at 68 and her pulse oximetry is at 100% so I suspect this was a venous sample.  MP shows mild hypokalemia, hypoproteinemia you and patient's blood sugars were low.  Her husband states that she has not been taking in food and has only been taking occasional sips of fluid.  He does feel that she has becoming more weak and confused.  I reviewed the patient's 1 view chest x-ray which shows increasing opacities versus pulmonary edema however patient's BNP is not elevated.  I therefore ordered CT angios PE study.  I discussed the case with Dr. Marlowe Sax who will admit the patient and follow-up on the CT angiogram.  Final Clinical Impressions(s) / ED Diagnoses   Final diagnoses:  Hypoglycemia  Anorexia  Hospice care patient  Metastatic breast cancer [facility])  Other acute pulmonary embolism, unspecified whether acute cor pulmonale present [facility])  Labored breathing  Tachypnea     ED Discharge Orders    None       Margarita Mail, PA-C 09/18/18 1447    Davonna Belling, MD 09/22/18 [phone]

## 2018-09-17 NOTE — Progress Notes (Signed)
ANTICOAGULATION CONSULT NOTE - Initial Consult  Pharmacy Consult for Heparin Indication: pulmonary embolus  Allergies  Allergen Reactions  . Fentanyl Nausea And Vomiting and Other (See Comments)    Headache  . Morphine And Related Nausea And Vomiting and Other (See Comments)    Headache    Patient Measurements: Height: 5' 7.5" (171.5 cm) Weight: 163 lb (73.9 kg) IBW/kg (Calculated) : 62.75   Vital Signs: Temp: 98.7 F (37.1 C) (09/12 1710) Temp Source: Oral (09/12 1710) BP: 143/86 (09/12 2245) Pulse Rate: 122 (09/12 2245)  Labs: Recent Labs    09/17/18 1800 09/17/18 1858 09/17/18 2040  HGB 10.1* 11.6*  --   HCT 35.9* 34.0*  --   PLT 373  --   --   CREATININE 0.57  --   --   TROPONINIHS 8  --  9    Estimated Creatinine Clearance: 71.4 mL/min (by C-G formula based on SCr of 0.57 mg/dL).   Medical History: Past Medical History:  Diagnosis Date  . Anxiety   . Arthritis   . Bronchitis   . COPD (chronic obstructive pulmonary disease) ([place])   . Depression   . Hypertension   . Malignant neoplasm of upper-outer quadrant of left female breast ([place]) 01/04/2015  . Nocturia   . Numbness and tingling    toes - bilateral  . Pneumonia   . PONV (postoperative nausea and vomiting)    Nausea    Medications:  No current facility-administered medications on file prior to encounter.    Current Outpatient Medications on File Prior to Encounter  Medication Sig Dispense Refill  . albuterol (PROVENTIL) (2.5 MG/3ML) 0.083% nebulizer solution Take 3 mLs (2.5 mg total) by nebulization every 6 (six) hours as needed for up to 30 days for wheezing or shortness of breath. 75 mL 0  . albuterol (VENTOLIN HFA) 108 (90 Base) MCG/ACT inhaler Inhale 2 puffs into the lungs every 6 (six) hours as needed for wheezing or shortness of breath. 108 g 2  . apixaban (ELIQUIS) 5 MG TABS tablet Take 1 tablet (5 mg total) by mouth 2 (two) times daily. 60 tablet 1  . aspirin EC 81 MG tablet Take 81 mg  by mouth daily.    [name] dronabinol (MARINOL) 2.5 MG capsule Take 1 capsule (2.5 mg total) by mouth 2 (two) times daily before a meal. 60 capsule 0  . gabapentin (NEURONTIN) 400 MG capsule Take 1 capsule (400 mg total) by mouth 3 (three) times daily. 30 capsule 0  . lidocaine-prilocaine (EMLA) cream Apply to affected area once (Patient taking differently: Apply 1 application topically daily as needed (port access). ) 30 g 3  . metoprolol tartrate (LOPRESSOR) 25 MG tablet Take 0.5 tablets (12.5 mg total) by mouth 2 (two) times daily. 30 tablet 0  . Multiple Vitamin (MULTIVITAMIN WITH MINERALS) TABS tablet Take 1 tablet by mouth daily. 30 tablet 0  . nicotine (NICODERM CQ - DOSED IN MG/24 HOURS) 21 mg/24hr patch Place 1 patch (21 mg total) onto the skin daily. 28 patch 0  . ondansetron (ZOFRAN) 8 MG tablet Take 1 tablet (8 mg total) by mouth every 8 (eight) hours as needed for nausea. 30 tablet 3  . oxyCODONE-acetaminophen (PERCOCET/ROXICET) 5-325 MG tablet Take 1 tablet by mouth every 4 (four) hours as needed for severe pain.    [name] PARoxetine (PAXIL) 20 MG tablet Take 1 tablet (20 mg total) by mouth 2 (two) times daily. 60 tablet 1  . potassium chloride (K-DUR) 10 MEQ tablet  Take 2 tablets (20 mEq total) by mouth daily. 10 tablet 0  . PREMARIN vaginal cream INSERT 1 APPLICATORFUL VAGINALLY DAILY (Patient taking differently: Place 1 Applicatorful vaginally daily as needed (dryness). ) 30 g 12  . prochlorperazine (COMPAZINE) 10 MG tablet Take 1 tablet (10 mg total) by mouth every 6 (six) hours as needed (Nausea or vomiting). (Patient taking differently: Take 10 mg by mouth every 6 (six) hours as needed for nausea or vomiting. ) 30 tablet 1  . promethazine (PHENERGAN) 12.5 MG tablet TAKE 1 TABLET(12.5 MG) BY MOUTH EVERY 6 HOURS AS NEEDED FOR NAUSEA OR VOMITING 90 tablet 0     Assessment: 63 y.o. female with h/o PE, Eliquis on hold, for heparin.  Baseline heparin level undetectable, indicating possible  noncompliance.  Goal of Therapy:  Heparin level 0.3-0.7 Monitor platelets by anticoagulation protocol: Yes   Plan:  Heparin 4000 units IV bolus, then start heparin 1300 units/hr Heparin level in 8 hours  Caryl Pina 09/17/2018,11:04 PM

## 2018-09-17 NOTE — ED Notes (Signed)
CBG 62 PA notified and pt given coke

## 2018-09-17 NOTE — ED Notes (Signed)
Pt placed on nonrebreather per verbal MD order

## 2018-09-18 ENCOUNTER — Inpatient Hospital Stay (HOSPITAL_COMMUNITY)

## 2018-09-18 DIAGNOSIS — J9601 Acute respiratory failure with hypoxia: Secondary | ICD-10-CM

## 2018-09-18 DIAGNOSIS — J9602 Acute respiratory failure with hypercapnia: Secondary | ICD-10-CM

## 2018-09-18 DIAGNOSIS — Z515 Encounter for palliative care: Secondary | ICD-10-CM

## 2018-09-18 LAB — GLUCOSE, CAPILLARY
Glucose-Capillary: 87 mg/dL (ref 70–99)
Glucose-Capillary: 92 mg/dL (ref 70–99)
Glucose-Capillary: 124 mg/dL — ABNORMAL HIGH (ref 70–99)
Glucose-Capillary: 135 mg/dL — ABNORMAL HIGH (ref 70–99)

## 2018-09-18 LAB — PROCALCITONIN: Procalcitonin: 0.14 ng/mL

## 2018-09-18 LAB — BLOOD GAS, ARTERIAL
Acid-Base Excess: 14.6 mmol/L — ABNORMAL HIGH (ref 0.0–2.0)
Acid-Base Excess: 11.2 mmol/L — ABNORMAL HIGH (ref 0.0–2.0)
Bicarbonate: 39.9 mmol/L — ABNORMAL HIGH (ref 20.0–28.0)
Bicarbonate: 36.9 mmol/L — ABNORMAL HIGH (ref 20.0–28.0)
Delivery systems: POSITIVE
Drawn by: 350431
Expiratory PAP: 8
FIO2: 0.4
Inspiratory PAP: 16
O2 Content: 6 L/min
O2 Saturation: 93.3 %
O2 Saturation: 91.2 %
Patient temperature: 98.6
Patient temperature: 97.5
RATE: 12 resp/min
pCO2 arterial: 62.1 mmHg — ABNORMAL HIGH (ref 32.0–48.0)
pCO2 arterial: 64.9 mmHg — ABNORMAL HIGH (ref 32.0–48.0)
pH, Arterial: 7.423 (ref 7.350–7.450)
pH, Arterial: 7.369 (ref 7.350–7.450)
pO2, Arterial: 70.8 mmHg — ABNORMAL LOW (ref 83.0–108.0)
pO2, Arterial: 67.1 mmHg — ABNORMAL LOW (ref 83.0–108.0)

## 2018-09-18 LAB — BASIC METABOLIC PANEL
Anion gap: 14 (ref 5–15)
BUN: <5 mg/dL — ABNORMAL LOW (ref 8–23)
CO2: 34 mmol/L — ABNORMAL HIGH (ref 22–32)
Calcium: 8 mg/dL — ABNORMAL LOW (ref 8.9–10.3)
Chloride: 94 mmol/L — ABNORMAL LOW (ref 98–111)
Creatinine, Ser: 0.49 mg/dL (ref 0.44–1.00)
GFR calc Af Amer: >60 mL/min (ref >60)
GFR calc non Af Amer: >60 mL/min (ref >60)
Glucose, Bld: 151 mg/dL — ABNORMAL HIGH (ref 70–99)
Potassium: 3.3 mmol/L — ABNORMAL LOW (ref 3.5–5.1)
Sodium: 142 mmol/L (ref 135–145)

## 2018-09-18 LAB — TROPONIN I (HIGH SENSITIVITY): Troponin I (High Sensitivity): 16 ng/L (ref <18)

## 2018-09-18 LAB — HEPARIN LEVEL (UNFRACTIONATED)
Heparin Unfractionated: <0.1 IU/mL — ABNORMAL LOW (ref 0.30–0.70)
Heparin Unfractionated: 0.47 IU/mL (ref 0.30–0.70)

## 2018-09-18 LAB — PROTIME-INR
INR: 1 (ref 0.8–1.2)
Prothrombin Time: 12.9 seconds (ref 11.4–15.2)

## 2018-09-18 LAB — APTT: aPTT: 33 seconds (ref 24–36)

## 2018-09-18 LAB — BRAIN NATRIURETIC PEPTIDE: B Natriuretic Peptide: 147.6 pg/mL — ABNORMAL HIGH (ref 0.0–100.0)

## 2018-09-18 LAB — MAGNESIUM: Magnesium: 1.7 mg/dL (ref 1.7–2.4)

## 2018-09-18 MED ORDER — HEPARIN SOD (PORK) LOCK FLUSH 100 UNIT/ML IV SOLN
500.0000 [IU] | INTRAVENOUS | Status: AC | PRN
  Administered 2018-09-18: 500 [IU]

## 2018-09-18 MED ORDER — PIPERACILLIN-TAZOBACTAM 3.375 G IVPB
3.3750 g | Freq: Three times a day (TID) | INTRAVENOUS | Status: DC
  Administered 2018-09-18 (×2): 3.375 g via INTRAVENOUS
  Filled 2018-09-18 (×2): qty 50

## 2018-09-18 MED ORDER — CHLORHEXIDINE GLUCONATE CLOTH 2 % EX PADS
6.0000 | MEDICATED_PAD | Freq: Every day | CUTANEOUS | Status: DC
  Administered 2018-09-18: 6 via TOPICAL

## 2018-09-18 MED ORDER — HEPARIN BOLUS VIA INFUSION
4000.0000 [IU] | Freq: Once | INTRAVENOUS | Status: AC
  Administered 2018-09-18: 4000 [IU] via INTRAVENOUS
  Filled 2018-09-18: qty 4000

## 2018-09-18 MED ORDER — VANCOMYCIN HCL 10 G IV SOLR
1250.0000 mg | Freq: Once | INTRAVENOUS | Status: AC
  Administered 2018-09-18: 04:00:00 1250 mg via INTRAVENOUS
  Filled 2018-09-18: qty 1250

## 2018-09-18 MED ORDER — HEPARIN (PORCINE) 25000 UT/250ML-% IV SOLN
1300.0000 [IU]/h | INTRAVENOUS | Status: DC
  Administered 2018-09-18: 1300 [IU]/h via INTRAVENOUS
  Filled 2018-09-18: qty 250

## 2018-09-18 MED ORDER — ONDANSETRON HCL 4 MG/2ML IJ SOLN: 4.0000 mg | Freq: Four times a day (QID) | INTRAMUSCULAR | Status: DC | PRN

## 2018-09-18 MED ORDER — LEVOFLOXACIN 750 MG PO TABS: 750.0000 mg | ORAL_TABLET | Freq: Every day | ORAL | 0 refills | Status: AC

## 2018-09-18 MED ORDER — WHITE PETROLATUM EX OINT
TOPICAL_OINTMENT | CUTANEOUS | Status: AC
  Administered 2018-09-18: 03:00:00
  Filled 2018-09-18: qty 28.35

## 2018-09-18 MED ORDER — MORPHINE SULFATE (PF) 2 MG/ML IV SOLN: 1.0000 mg | INTRAVENOUS | Status: DC | PRN

## 2018-09-18 MED ORDER — POTASSIUM CHLORIDE 10 MEQ/100ML IV SOLN
10.0000 meq | INTRAVENOUS | Status: DC
  Administered 2018-09-18 (×2): 10 meq via INTRAVENOUS
  Filled 2018-09-18 (×4): qty 100

## 2018-09-18 MED ORDER — VANCOMYCIN HCL IN DEXTROSE 750-5 MG/150ML-% IV SOLN
750.0000 mg | Freq: Two times a day (BID) | INTRAVENOUS | Status: DC
  Filled 2018-09-18: qty 150

## 2018-09-18 MED ORDER — FUROSEMIDE 10 MG/ML IJ SOLN
INTRAMUSCULAR | Status: AC
  Administered 2018-09-18: 07:00:00 40 mg
  Filled 2018-09-18: qty 4

## 2018-09-18 MED ORDER — IOHEXOL 350 MG/ML SOLN
75.0000 mL | Freq: Once | INTRAVENOUS | Status: AC | PRN
  Administered 2018-09-18: 75 mL via INTRAVENOUS

## 2018-09-18 MED ORDER — ONDANSETRON HCL 4 MG/2ML IJ SOLN
INTRAMUSCULAR | Status: AC
  Administered 2018-09-18: 4 mg
  Filled 2018-09-18: qty 2

## 2018-09-18 MED ORDER — MORPHINE SULFATE (PF) 2 MG/ML IV SOLN: 1.0000 mg | Freq: Once | INTRAVENOUS | Status: DC

## 2018-09-18 MED ORDER — MORPHINE SULFATE (PF) 2 MG/ML IV SOLN
INTRAVENOUS | Status: AC
  Administered 2018-09-18: 1 mg via INTRAMUSCULAR
  Filled 2018-09-18: qty 1

## 2018-09-18 NOTE — Discharge Summary (Signed)
Physician Discharge Summary  Deborah Mcintyre PJA:[id number] DOB: 08-05-1955 DOA: 09/17/2018  PCP: [name], MD  Admit date: 09/17/2018 Discharge date: 09/18/2018  Admitted From: Home Disposition:  Home  Discharge Condition:Stable CODE STATUS: DNR Diet recommendation:Regular   Brief/Interim Summary:  Patient is a 63 year old female with history of metastatic breast cancer with mild and recurrent pleural effusion, recently diagnosed PE, currently on hospice, COPD, anxiety/depression, hypertension who presented to the emergency department for the evaluation of chest pain/shortness of breath.  She was found to be tachypneic/tachycardic on presentation requiring 6 L of supplemental oxygen.  ABG showed hypoxia, hypercarbia.  Chest x-ray showed increased right lung interstitial opacities, unchanged left pleural effusion with diffuse left lung opacities.  Patient was found to be in acute hypoxic respiratory failure.  Critical care was consulted and and it has been concluded that she  is not a candidate for intubation.  After discussion with the husband, she has been made DNR.  Patient is a candidate for hospice.  Husband prefers home hospice  Palliative care was following.  She is being discharged to home today with hospice as per patient's and husband's wishes .  Following problems were addressed during her hospitalization:  Acute hypoxic and hypercarbic respiratory failure: Due to recurrent malignant left pleural effusion.  Also has history of recent PE, currently not on anticoagulation.  Was full code when she presented here.  She was following with hospice at home. Chest x-ray as above.  She had multiple recent hospitalization for dyspnea, chest pain.  She previously had Pleurx drain which has been removed because it was not functioning due to loculated pleural effusion.  No plan for thoracentesis. PCCM was consulted but it has been concluded that she is not a candidate for intubation.  Put on  BiPAP on presentation with some improvement.  Currently on nasal cannula.  Multifocal pneumonia/recently diagnosed PE: Not on anticoagulation at home currently .  She has been started on IV heparin which we will discontinue.  CT chest done on this admission showed a small amount of residual pulmonary embolus in the right lower lobe, increase in the size of loculated right pleural effusion, extensive left pleural thickening, increasing masslike consolidative opacities throughout the both lungs with adjacent  groundglass concerning multifocal pneumonia.  Patient was started on antibiotics.  We also discussed with  cardiothoracic surgery but she is not a surgical candidate.Abx changed to oral on discharge.  Hypoglycemia/hypokalemia: Supplemented with potassium.   COPD: No wheezing at present.   History of hypertension: Currently normotensive.  Metastatic breast cancer: Currently not on treatment.  Goals of care: After discussion with family, palliative care she has been made DNR.   family/ Husband prefers home hospice.  Patient and her husband want  to go home today.     Discharge Diagnoses:  Principal Problem:   Acute respiratory failure with hypoxia and hypercapnia (HCC) Active Problems:   Hypertension   COPD (chronic obstructive pulmonary disease) (HCC)   Hypokalemia   Hypoglycemia   Palliative care encounter    Discharge Instructions  Discharge Instructions    Diet general   Complete by: As directed    Discharge instructions   Complete by: As directed    1)Please follow up with home hospice   Increase activity slowly   Complete by: As directed      Allergies as of 09/18/2018      Reactions   Fentanyl Nausea And Vomiting, Other (See Comments)   Headache   Morphine And Related  Nausea And Vomiting, Other (See Comments)   Headache      Medication List    TAKE these medications   albuterol (2.5 MG/3ML) 0.083% nebulizer solution Commonly known as: PROVENTIL Take  3 mLs (2.5 mg total) by nebulization every 6 (six) hours as needed for up to 30 days for wheezing or shortness of breath.   albuterol 108 (90 Base) MCG/ACT inhaler Commonly known as: VENTOLIN HFA Inhale 2 puffs into the lungs every 6 (six) hours as needed for wheezing or shortness of breath.   apixaban 5 MG Tabs tablet Commonly known as: ELIQUIS Take 1 tablet (5 mg total) by mouth 2 (two) times daily.   aspirin EC 81 MG tablet Take 81 mg by mouth daily.   dronabinol 2.5 MG capsule Commonly known as: MARINOL Take 1 capsule (2.5 mg total) by mouth 2 (two) times daily before a meal.   gabapentin 400 MG capsule Commonly known as: NEURONTIN Take 1 capsule (400 mg total) by mouth 3 (three) times daily.   levofloxacin 750 MG tablet Commonly known as: LEVAQUIN Take 1 tablet (750 mg total) by mouth daily for 6 days. Start taking on: September 19, 2018   lidocaine-prilocaine cream Commonly known as: EMLA Apply to affected area once What changed:   how much to take  how to take this  when to take this  reasons to take this  additional instructions   metoprolol tartrate 25 MG tablet Commonly known as: LOPRESSOR Take 0.5 tablets (12.5 mg total) by mouth 2 (two) times daily.   multivitamin with minerals Tabs tablet Take 1 tablet by mouth daily.   nicotine 21 mg/24hr patch Commonly known as: NICODERM CQ - dosed in mg/24 hours Place 1 patch (21 mg total) onto the skin daily.   ondansetron 8 MG tablet Commonly known as: ZOFRAN Take 1 tablet (8 mg total) by mouth every 8 (eight) hours as needed for nausea.   oxyCODONE-acetaminophen 5-325 MG tablet Commonly known as: PERCOCET/ROXICET Take 1 tablet by mouth every 4 (four) hours as needed for severe pain.   PARoxetine 20 MG tablet Commonly known as: PAXIL Take 1 tablet (20 mg total) by mouth 2 (two) times daily.   potassium chloride 10 MEQ tablet Commonly known as: K-DUR Take 2 tablets (20 mEq total) by mouth daily.    Premarin vaginal cream Generic drug: conjugated estrogens INSERT 1 APPLICATORFUL VAGINALLY DAILY What changed: See the new instructions.   prochlorperazine 10 MG tablet Commonly known as: COMPAZINE Take 1 tablet (10 mg total) by mouth every 6 (six) hours as needed (Nausea or vomiting). What changed: reasons to take this   promethazine 12.5 MG tablet Commonly known as: PHENERGAN TAKE 1 TABLET(12.5 MG) BY MOUTH EVERY 6 HOURS AS NEEDED FOR NAUSEA OR VOMITING       Allergies  Allergen Reactions  . Fentanyl Nausea And Vomiting and Other (See Comments)    Headache  . Morphine And Related Nausea And Vomiting and Other (See Comments)    Headache    Consultations:  PCCM, palliative care   Procedures/Studies: Ct Angio Chest Pe W And/or Wo Contrast  Result Date: 09/18/2018 CLINICAL DATA:  Worsening shortness of breath, recent PE EXAM: CT ANGIOGRAPHY CHEST WITH CONTRAST TECHNIQUE: Multidetector CT imaging of the chest was performed using the standard protocol during bolus administration of intravenous contrast. Multiplanar CT image reconstructions and MIPs were obtained to evaluate the vascular anatomy. CONTRAST:  25mL OMNIPAQUE IOHEXOL 350 MG/ML SOLN COMPARISON:  CT 07/27/2018 FINDINGS: Cardiovascular: There is  satisfactory opacification of the pulmonary arteries. Small amount of residual embolus again seen in the medial basal segmental pulmonary artery of the right lower lobe. There has been progressive narrowing and now abrupt truncation at the origin of the lingular and upper lobe pulmonary arteries. Normal heart size. Small pericardial effusion. Reflux of contrast into the hepatic veins. Elevation of the RV/LV ratio (1.86). Right jugular Port-A-Cath tip terminates at the superior cavoatrial junction. Borderline dilation of the ascending thoracic aorta up to 4 cm. Returns to a normal caliber of 2.9 cm at the level of the arch. No dissection flap or acute aortic abnormality. Atheromatous  plaque throughout thoracic aorta. Mediastinum/Nodes: Redemonstration of the multiple enlarged mediastinal lymph nodes. These appear grossly stable from recent comparison CT. Reference lymph nodes include a 13 mm short axis precarinal lymph node (5/53) and a 23 mm subcarinal nodal complex (5/68). Lungs/Pleura: There is complete collapse of the left upper lobe with heterogeneous attenuation of the parenchyma and abrupt truncation of both the entering bronchi and pulmonary arteries. Adjacent sclerotic changes of the ribs are nonspecific could reflect post radiation changes. Subtotal collapse of the left lower lobe is present as well with increasing masslike opacity in the left lung base. The right hemithorax demonstrates multifocal areas masslike consolidation with surrounding ground-glass opacities which are significantly increased in number in size from comparison CT in July 2020 there is new moderate right pleural effusion. Upper Abdomen: Few borderline enlarged upper abdominal nodes are present anterior to the lesser curvature of the stomach. Reference nodes include a 14 mm gastrohepatic ligament lymph node (5/36). No other acute abnormality in the upper abdomen. Musculoskeletal: Postsurgical changes from prior mastectomy of the left chest wall. Mild circumferential body wall edema. No acute osseous abnormality. Review of the MIP images confirms the above findings. IMPRESSION: Small amount of residual pulmonary embolus in the right lower lobe. Persistent elevation of the RV/LV ratio and enlarged central pulmonary arteries. May reflect component of right heart strain mixed with chronic right heart failure Increase in size of the loculated right pleural effusion following removal of the Pleurx catheter seen previously. Extensive left pleural thickening. Increasing masslike consolidative opacities throughout both lungs with adjacent ground-glass concerning multifocal pneumonia given rapid progression. Follow-up  imaging is recommended to exclude underlying malignancy/metastasis. New truncation of the left upper lobe airways and pulmonary arteries entering the airless lung. Heterogeneous attenuation the lung parenchyma is concerning for superinfection and/or necrosis. Extensive mediastinal, hilar upper abdominal adenopathy, reference nodes detailed above. Post mastectomy and post therapy changes of the left chest wall. These results were called by telephone at the time of interpretation on 09/18/2018 at 12:41 am to provider PA Margarita Mail , who verbally acknowledged these results. Electronically Signed   By: Lovena Le M.D.   On: 09/18/2018 00:43   Dg Chest Portable 1 View  Result Date: 09/17/2018 CLINICAL DATA:  Acute shortness of breath. History of breast and lung cancer. EXAM: PORTABLE CHEST 1 VIEW COMPARISON:  08/06/2018 and prior radiographs FINDINGS: LEFT pleural effusion and LEFT lung opacities are unchanged. Increased interstitial opacities within the RIGHT lung are noted. Probable trace RIGHT pleural effusion noted. A RIGHT Port-A-Cath is again identified with tip overlying the LOWER SVC. No pneumothorax. IMPRESSION: Increased RIGHT lung interstitial opacities, favor edema over infection. Probable trace RIGHT pleural effusion. Unchanged LEFT pleural effusion and diffuse LEFT lung opacities. Electronically Signed   By: Margarette Canada M.D.   On: 09/17/2018 18:00       Subjective:  Patient  seen and examined the bedside this morning.  Critically ill.  After discussion with the family, we are discharging her to home with hospice   Discharge Exam: Vitals:   09/18/18 1200 09/18/18 1300  BP: (!) 108/94 104/69  Pulse:    Resp: (!) 23 (!) 27  Temp:    SpO2:  100%   Vitals:   09/18/18 1001 09/18/18 1109 09/18/18 1200 09/18/18 1300  BP: 107/84 106/80 (!) 108/94 104/69  Pulse: (!) 123 (!) 115    Resp: (!) 22 19 (!) 23 (!) 27  Temp:      TempSrc:      SpO2: 100% 100%  100%  Weight:      Height:         General: Pt is lethargic, critically ill Cardiovascular: Sinus tachycardia, S1/S2 +, no rubs, no gallops Respiratory: Bilateral decreased air entry Abdominal: Soft, NT, ND, bowel sounds + Extremities: no edema, no cyanosis    The results of significant diagnostics from this hospitalization (including imaging, microbiology, ancillary and laboratory) are listed below for reference.     Microbiology: Recent Results (from the past 240 hour(s))  SARS Coronavirus 2 [facility] order, Performed in [facility] hospital lab) Nasopharyngeal Nasopharyngeal Swab     Status: None   Collection Time: 09/17/18  5:31 PM   Specimen: Nasopharyngeal Swab  Result Value Ref Range Status   SARS Coronavirus 2 NEGATIVE NEGATIVE Final    Comment: (NOTE) If result is NEGATIVE SARS-CoV-2 target nucleic acids are NOT DETECTED. The SARS-CoV-2 RNA is generally detectable in upper and lower  respiratory specimens during the acute phase of infection. The lowest  concentration of SARS-CoV-2 viral copies this assay can detect is 250  copies / mL. A negative result does not preclude SARS-CoV-2 infection  and should not be used as the sole basis for treatment or other  patient management decisions.  A negative result may occur with  improper specimen collection / handling, submission of specimen other  than nasopharyngeal swab, presence of viral mutation(s) within the  areas targeted by this assay, and inadequate number of viral copies  (<250 copies / mL). A negative result must be combined with clinical  observations, patient history, and epidemiological information. If result is POSITIVE SARS-CoV-2 target nucleic acids are DETECTED. The SARS-CoV-2 RNA is generally detectable in upper and lower  respiratory specimens dur ing the acute phase of infection.  Positive  results are indicative of active infection with SARS-CoV-2.  Clinical  correlation with patient history and other diagnostic information is   necessary to determine patient infection status.  Positive results do  not rule out bacterial infection or co-infection with other viruses. If result is PRESUMPTIVE POSTIVE SARS-CoV-2 nucleic acids MAY BE PRESENT.   A presumptive positive result was obtained on the submitted specimen  and confirmed on repeat testing.  While 2019 novel coronavirus  (SARS-CoV-2) nucleic acids may be present in the submitted sample  additional confirmatory testing may be necessary for epidemiological  and / or clinical management purposes  to differentiate between  SARS-CoV-2 and other Sarbecovirus currently known to infect humans.  If clinically indicated additional testing with an alternate test  methodology [phone]) is advised. The SARS-CoV-2 RNA is generally  detectable in upper and lower respiratory sp ecimens during the acute  phase of infection. The expected result is Negative. Fact Sheet for Patients:  StrictlyIdeas.no Fact Sheet for Healthcare Providers: BankingDealers.co.za This test is not yet approved or cleared by the [place] FDA and has  been authorized for detection and/or diagnosis of SARS-CoV-2 by FDA under an Emergency Use Authorization (EUA).  This EUA will remain in effect (meaning this test can be used) for the duration of the COVID-19 declaration under Section 564(b)(1) of the Act, 21 U.S.C. section 360bbb-3(b)(1), unless the authorization is terminated or revoked sooner. Performed at [facility], [place] [address], [place], [place] [address]      Labs: BNP (last 3 results) Recent Labs    07/27/18 1602 09/17/18 1800 09/18/18 0630  BNP 67.6 78.4 154.0*   Basic Metabolic Panel: Recent Labs  Lab 09/17/18 1800 09/17/18 1858 09/18/18 0322 09/18/18 0630  NA 140 139  --  142  K 3.2* 3.0*  --  3.3*  CL 90*  --   --  94*  CO2 33*  --   --  34*  GLUCOSE 77  --   --  151*  BUN <5*  --   --  <5*  CREATININE 0.57  --    --  0.49  CALCIUM 8.7*  --   --  8.0*  MG  --   --  1.7  --    Liver Function Tests: Recent Labs  Lab 09/17/18 1800  AST 13*  ALT 8  ALKPHOS 56  BILITOT 1.4*  PROT 5.5*  ALBUMIN 2.4*   No results for input(s): LIPASE, AMYLASE in the last 168 hours. No results for input(s): AMMONIA in the last 168 hours. CBC: Recent Labs  Lab 09/17/18 1800 09/17/18 1858  WBC 5.6  --   HGB 10.1* 11.6*  HCT 35.9* 34.0*  MCV 93.0  --   PLT 373  --    Cardiac Enzymes: No results for input(s): CKTOTAL, CKMB, CKMBINDEX, TROPONINI in the last 168 hours. BNP: Invalid input(s): POCBNP CBG: Recent Labs  Lab 09/17/18 2040 09/18/18 0159 09/18/18 0401 09/18/18 0557 09/18/18 1030  GLUCAP 76 87 92 124* 135*   D-Dimer No results for input(s): DDIMER in the last 72 hours. Hgb A1c No results for input(s): HGBA1C in the last 72 hours. Lipid Profile No results for input(s): CHOL, HDL, LDLCALC, TRIG, CHOLHDL, LDLDIRECT in the last 72 hours. Thyroid function studies No results for input(s): TSH, T4TOTAL, T3FREE, THYROIDAB in the last 72 hours.  Invalid input(s): FREET3 Anemia work up No results for input(s): VITAMINB12, FOLATE, FERRITIN, TIBC, IRON, RETICCTPCT in the last 72 hours. Urinalysis    Component Value Date/Time   COLORURINE YELLOW 12/06/2014 2207   APPEARANCEUR CLOUDY (A) 12/06/2014 2207   LABSPEC 1.025 12/06/2014 2207   PHURINE 5.5 12/06/2014 2207   GLUCOSEU NEGATIVE 12/06/2014 2207   HGBUR MODERATE (A) 12/06/2014 2207   BILIRUBINUR NEGATIVE 12/06/2014 2207   KETONESUR NEGATIVE 12/06/2014 2207   PROTEINUR NEGATIVE 12/06/2014 2207   UROBILINOGEN 1.0 07/30/2013 1527   NITRITE POSITIVE (A) 12/06/2014 2207   LEUKOCYTESUR SMALL (A) 12/06/2014 2207   Sepsis Labs Invalid input(s): PROCALCITONIN,  WBC,  LACTICIDVEN Microbiology Recent Results (from the past 240 hour(s))  SARS Coronavirus 2 [facility] order, Performed in [facility] hospital lab) Nasopharyngeal Nasopharyngeal Swab      Status: None   Collection Time: 09/17/18  5:31 PM   Specimen: Nasopharyngeal Swab  Result Value Ref Range Status   SARS Coronavirus 2 NEGATIVE NEGATIVE Final    Comment: (NOTE) If result is NEGATIVE SARS-CoV-2 target nucleic acids are NOT DETECTED. The SARS-CoV-2 RNA is generally detectable in upper and lower  respiratory specimens during the acute phase of infection. The lowest  concentration of  SARS-CoV-2 viral copies this assay can detect is 250  copies / mL. A negative result does not preclude SARS-CoV-2 infection  and should not be used as the sole basis for treatment or other  patient management decisions.  A negative result may occur with  improper specimen collection / handling, submission of specimen other  than nasopharyngeal swab, presence of viral mutation(s) within the  areas targeted by this assay, and inadequate number of viral copies  (<250 copies / mL). A negative result must be combined with clinical  observations, patient history, and epidemiological information. If result is POSITIVE SARS-CoV-2 target nucleic acids are DETECTED. The SARS-CoV-2 RNA is generally detectable in upper and lower  respiratory specimens dur ing the acute phase of infection.  Positive  results are indicative of active infection with SARS-CoV-2.  Clinical  correlation with patient history and other diagnostic information is  necessary to determine patient infection status.  Positive results do  not rule out bacterial infection or co-infection with other viruses. If result is PRESUMPTIVE POSTIVE SARS-CoV-2 nucleic acids MAY BE PRESENT.   A presumptive positive result was obtained on the submitted specimen  and confirmed on repeat testing.  While 2019 novel coronavirus  (SARS-CoV-2) nucleic acids may be present in the submitted sample  additional confirmatory testing may be necessary for epidemiological  and / or clinical management purposes  to differentiate between  SARS-CoV-2 and  other Sarbecovirus currently known to infect humans.  If clinically indicated additional testing with an alternate test  methodology [phone]) is advised. The SARS-CoV-2 RNA is generally  detectable in upper and lower respiratory sp ecimens during the acute  phase of infection. The expected result is Negative. Fact Sheet for Patients:  StrictlyIdeas.no Fact Sheet for Healthcare Providers: BankingDealers.co.za This test is not yet approved or cleared by the [place] FDA and has been authorized for detection and/or diagnosis of SARS-CoV-2 by FDA under an Emergency Use Authorization (EUA).  This EUA will remain in effect (meaning this test can be used) for the duration of the COVID-19 declaration under Section 564(b)(1) of the Act, 21 U.S.C. section 360bbb-3(b)(1), unless the authorization is terminated or revoked sooner. Performed at [facility], [place] [address] [place], [place] [address]     Please note: You were cared for by a hospitalist during your hospital stay. Once you are discharged, your primary care physician will handle any further medical issues. Please note that NO REFILLS for any discharge medications will be authorized once you are discharged, as it is imperative that you return to your primary care physician (or establish a relationship with a primary care physician if you do not have one) for your post hospital discharge needs so that they can reassess your need for medications and monitor your lab values.    Time coordinating discharge: 40 minutes  SIGNED:   Shelly Coss, MD  [facility] 09/18/2018, 2:31 PM Pager [phone]  If 7PM-7AM, please contact night-coverage [email or web address] Password [email or web address]

## 2018-09-18 NOTE — Progress Notes (Signed)
PROGRESS NOTE    Deborah Mcintyre  GMW:[id number] DOB: 17-Aug-1955 DOA: 09/17/2018 PCP: [name], MD   Brief Narrative:  Patient is a 63 year old female with history of metastatic breast cancer with mild and recurrent pleural effusion, recently diagnosed PE, currently on hospice, COPD, anxiety/depression, hypertension who presented to the emergency department for the evaluation of chest pain/shortness of breath.  She was found to be tachypneic/tachycardic on presentation requiring 6 L of supplemental oxygen.  ABG showed hypoxia, hypercarbia.  Chest x-ray showed increased right lung interstitial opacities, unchanged left pleural effusion with diffuse left lung opacities.  Patient was found to be in acute hypoxic respiratory failure.  Critical care was consulted and and it has been concluded that she  is not a candidate for intubation.  After discussion with the husband, she has been made DNR.  Patient is a candidate for hospice.  Husband prefers home hospice but she is too unstable to be discharged to home.  Residential hospice is the best option for her.  Palliative care following.Plan is to transition her care to hospice.  Assessment & Plan:   Principal Problem:   Acute respiratory failure with hypoxia and hypercapnia (HCC) Active Problems:   Hypertension   COPD (chronic obstructive pulmonary disease) (HCC)   Hypokalemia   Hypoglycemia   Palliative care encounter   Acute hypoxic and hypercarbic respiratory failure: Due to recurrent malignant left pleural effusion.  Also has history of recent PE, currently not on anticoagulation.  Was full code when she presented here.  She was following with hospice at home. Chest x-ray as above.  She had multiple recent hospitalization for dyspnea, chest pain.  She previously had Pleurx drain which has been removed because it was not functioning due to loculated pleural effusion.  No plan for thoracentesis. PCCM was consulted but it has been concluded  that she is not a candidate for intubation.  Put on BiPAP on presentation with some improvement.  Currently on nasal cannula.  Multifocal pneumonia/recently diagnosed PE: Not on anticoagulation at home.  She has been started on IV heparin which we will consider discontinuing.  CT chest done on this admission showed a small amount of residual pulmonary embolus in the right lower lobe, increase in the size of loculated right pleural effusion, extensive left pleural thickening, increasing masslike consolidative opacities throughout the both lungs with adjacent  groundglass concerning multifocal pneumonia.  Patient has also been started on antibiotics.  We also discussed with  cardiothoracic surgery but she is not a surgical candidate.  Hypoglycemia/hypokalemia: Supplemented with potassium.  Continue gentle D5-0.45 NaCl  COPD: No wheezing at present.  On BiPAP.  History of hypertension: Currently normotensive.  Metastatic breast cancer: Currently not on treatment.  Goals of care: After discussion with family, palliative care she has been made DNR.    Husband prefers home hospice but she is too unstable to be discharged to home.  Residential hospice is the best option for her.  Palliative care following.Plan is to transition her care to hospice.            DVT prophylaxis: heparin Code Status: DNR Family Communication: Husband at the bedside Disposition Plan: Residential hospice versus hospice at home   Consultants: PCCM  Procedures: None  Antimicrobials:  Anti-infectives (From admission, onward)   Start     Dose/Rate Route Frequency Ordered Stop   09/18/18 1800  vancomycin (VANCOCIN) IVPB 750 mg/150 ml premix     750 mg 150 mL/hr over 60 Minutes Intravenous Every  12 hours 09/18/18 0226     09/18/18 0400  piperacillin-tazobactam (ZOSYN) IVPB 3.375 g     3.375 g 12.5 mL/hr over 240 Minutes Intravenous Every 8 hours 09/18/18 0226     09/18/18 0300  vancomycin (VANCOCIN) 1,250 mg in  sodium chloride 0.9 % 250 mL IVPB     1,250 mg 166.7 mL/hr over 90 Minutes Intravenous  Once 09/18/18 0226 09/18/18 0509      Subjective:  Patient seen and examined the bedside this morning.  Currently hemodynamically stable, in sinus tachycardia.  Very lethargic.  Was on BiPAP during my evaluation.  Hardly responsive but opens eyes and tries to speak when called her name.  Objective: Vitals:   09/18/18 0700 09/18/18 0900 09/18/18 0902 09/18/18 1001  BP: 109/78 109/85  107/84  Pulse: (!) 123 (!) 125 (!) 122 (!) 123  Resp: (!) 31 (!) 21 (!) 30 (!) 22  Temp:      TempSrc:      SpO2: 100% 97%  100%  Weight:      Height:        Intake/Output Summary (Last 24 hours) at 09/18/2018 1253 Last data filed at 09/18/2018 1112 Gross per 24 hour  Intake 1075.57 ml  Output 1200 ml  Net -124.43 ml   Filed Weights   09/17/18 1802 09/18/18 0059  Weight: 73.9 kg 66.4 kg    Examination:  General exam: Lethargic, drowsy, critically ill HEENT: BiPAP Respiratory system: Bilateral decreased air entry Cardiovascular system: Sinus tachycardia no JVD, murmurs, rubs, gallops or clicks. No pedal edema. Gastrointestinal system: Abdomen is nondistended, soft and nontender. No organomegaly or masses felt. Normal bowel sounds heard. Central nervous system: Lethargic, awake but not alert or oriented  extremities: Trace edema on bilateral lower extremities , no clubbing ,no cyanosis Skin: No rashes, lesions or ulcers,no icterus ,no pallor    Data Reviewed: I have personally reviewed following labs and imaging studies  CBC: Recent Labs  Lab 09/17/18 1800 09/17/18 1858  WBC 5.6  --   HGB 10.1* 11.6*  HCT 35.9* 34.0*  MCV 93.0  --   PLT 373  --    Basic Metabolic Panel: Recent Labs  Lab 09/17/18 1800 09/17/18 1858 09/18/18 0322 09/18/18 0630  NA 140 139  --  142  K 3.2* 3.0*  --  3.3*  CL 90*  --   --  94*  CO2 33*  --   --  34*  GLUCOSE 77  --   --  151*  BUN <5*  --   --  <5*    CREATININE 0.57  --   --  0.49  CALCIUM 8.7*  --   --  8.0*  MG  --   --  1.7  --    GFR: Estimated Creatinine Clearance: 71.4 mL/min (by C-G formula based on SCr of 0.49 mg/dL). Liver Function Tests: Recent Labs  Lab 09/17/18 1800  AST 13*  ALT 8  ALKPHOS 56  BILITOT 1.4*  PROT 5.5*  ALBUMIN 2.4*   No results for input(s): LIPASE, AMYLASE in the last 168 hours. No results for input(s): AMMONIA in the last 168 hours. Coagulation Profile: Recent Labs  Lab 09/18/18 0152  INR 1.0   Cardiac Enzymes: No results for input(s): CKTOTAL, CKMB, CKMBINDEX, TROPONINI in the last 168 hours. BNP (last 3 results) No results for input(s): PROBNP in the last 8760 hours. HbA1C: No results for input(s): HGBA1C in the last 72 hours. CBG: Recent Labs  Lab 09/17/18  2040 09/18/18 0159 09/18/18 0401 09/18/18 0557 09/18/18 1030  GLUCAP 76 87 92 124* 135*   Lipid Profile: No results for input(s): CHOL, HDL, LDLCALC, TRIG, CHOLHDL, LDLDIRECT in the last 72 hours. Thyroid Function Tests: No results for input(s): TSH, T4TOTAL, FREET4, T3FREE, THYROIDAB in the last 72 hours. Anemia Panel: No results for input(s): VITAMINB12, FOLATE, FERRITIN, TIBC, IRON, RETICCTPCT in the last 72 hours. Sepsis Labs: Recent Labs  Lab 09/18/18 0322  PROCALCITON 0.14    Recent Results (from the past 240 hour(s))  SARS Coronavirus 2 [facility] order, Performed in [facility] hospital lab) Nasopharyngeal Nasopharyngeal Swab     Status: None   Collection Time: 09/17/18  5:31 PM   Specimen: Nasopharyngeal Swab  Result Value Ref Range Status   SARS Coronavirus 2 NEGATIVE NEGATIVE Final    Comment: (NOTE) If result is NEGATIVE SARS-CoV-2 target nucleic acids are NOT DETECTED. The SARS-CoV-2 RNA is generally detectable in upper and lower  respiratory specimens during the acute phase of infection. The lowest  concentration of SARS-CoV-2 viral copies this assay can detect is 250  copies / mL. A negative  result does not preclude SARS-CoV-2 infection  and should not be used as the sole basis for treatment or other  patient management decisions.  A negative result may occur with  improper specimen collection / handling, submission of specimen other  than nasopharyngeal swab, presence of viral mutation(s) within the  areas targeted by this assay, and inadequate number of viral copies  (<250 copies / mL). A negative result must be combined with clinical  observations, patient history, and epidemiological information. If result is POSITIVE SARS-CoV-2 target nucleic acids are DETECTED. The SARS-CoV-2 RNA is generally detectable in upper and lower  respiratory specimens dur ing the acute phase of infection.  Positive  results are indicative of active infection with SARS-CoV-2.  Clinical  correlation with patient history and other diagnostic information is  necessary to determine patient infection status.  Positive results do  not rule out bacterial infection or co-infection with other viruses. If result is PRESUMPTIVE POSTIVE SARS-CoV-2 nucleic acids MAY BE PRESENT.   A presumptive positive result was obtained on the submitted specimen  and confirmed on repeat testing.  While 2019 novel coronavirus  (SARS-CoV-2) nucleic acids may be present in the submitted sample  additional confirmatory testing may be necessary for epidemiological  and / or clinical management purposes  to differentiate between  SARS-CoV-2 and other Sarbecovirus currently known to infect humans.  If clinically indicated additional testing with an alternate test  methodology [phone]) is advised. The SARS-CoV-2 RNA is generally  detectable in upper and lower respiratory sp ecimens during the acute  phase of infection. The expected result is Negative. Fact Sheet for Patients:  StrictlyIdeas.no Fact Sheet for Healthcare Providers: BankingDealers.co.za This test is not yet  approved or cleared by the [place] FDA and has been authorized for detection and/or diagnosis of SARS-CoV-2 by FDA under an Emergency Use Authorization (EUA).  This EUA will remain in effect (meaning this test can be used) for the duration of the COVID-19 declaration under Section 564(b)(1) of the Act, 21 U.S.C. section 360bbb-3(b)(1), unless the authorization is terminated or revoked sooner. Performed at [facility], [place] [address] [place], [place] [address]          Radiology Studies: Ct Angio Chest Pe W And/or Wo Contrast  Result Date: 09/18/2018 CLINICAL DATA:  Worsening shortness of breath, recent PE EXAM: CT ANGIOGRAPHY CHEST WITH  CONTRAST TECHNIQUE: Multidetector CT imaging of the chest was performed using the standard protocol during bolus administration of intravenous contrast. Multiplanar CT image reconstructions and MIPs were obtained to evaluate the vascular anatomy. CONTRAST:  78mL OMNIPAQUE IOHEXOL 350 MG/ML SOLN COMPARISON:  CT 07/27/2018 FINDINGS: Cardiovascular: There is satisfactory opacification of the pulmonary arteries. Small amount of residual embolus again seen in the medial basal segmental pulmonary artery of the right lower lobe. There has been progressive narrowing and now abrupt truncation at the origin of the lingular and upper lobe pulmonary arteries. Normal heart size. Small pericardial effusion. Reflux of contrast into the hepatic veins. Elevation of the RV/LV ratio (1.86). Right jugular Port-A-Cath tip terminates at the superior cavoatrial junction. Borderline dilation of the ascending thoracic aorta up to 4 cm. Returns to a normal caliber of 2.9 cm at the level of the arch. No dissection flap or acute aortic abnormality. Atheromatous plaque throughout thoracic aorta. Mediastinum/Nodes: Redemonstration of the multiple enlarged mediastinal lymph nodes. These appear grossly stable from recent comparison CT. Reference lymph nodes include a 13 mm short  axis precarinal lymph node (5/53) and a 23 mm subcarinal nodal complex (5/68). Lungs/Pleura: There is complete collapse of the left upper lobe with heterogeneous attenuation of the parenchyma and abrupt truncation of both the entering bronchi and pulmonary arteries. Adjacent sclerotic changes of the ribs are nonspecific could reflect post radiation changes. Subtotal collapse of the left lower lobe is present as well with increasing masslike opacity in the left lung base. The right hemithorax demonstrates multifocal areas masslike consolidation with surrounding ground-glass opacities which are significantly increased in number in size from comparison CT in July 2020 there is new moderate right pleural effusion. Upper Abdomen: Few borderline enlarged upper abdominal nodes are present anterior to the lesser curvature of the stomach. Reference nodes include a 14 mm gastrohepatic ligament lymph node (5/36). No other acute abnormality in the upper abdomen. Musculoskeletal: Postsurgical changes from prior mastectomy of the left chest wall. Mild circumferential body wall edema. No acute osseous abnormality. Review of the MIP images confirms the above findings. IMPRESSION: Small amount of residual pulmonary embolus in the right lower lobe. Persistent elevation of the RV/LV ratio and enlarged central pulmonary arteries. May reflect component of right heart strain mixed with chronic right heart failure Increase in size of the loculated right pleural effusion following removal of the Pleurx catheter seen previously. Extensive left pleural thickening. Increasing masslike consolidative opacities throughout both lungs with adjacent ground-glass concerning multifocal pneumonia given rapid progression. Follow-up imaging is recommended to exclude underlying malignancy/metastasis. New truncation of the left upper lobe airways and pulmonary arteries entering the airless lung. Heterogeneous attenuation the lung parenchyma is concerning  for superinfection and/or necrosis. Extensive mediastinal, hilar upper abdominal adenopathy, reference nodes detailed above. Post mastectomy and post therapy changes of the left chest wall. These results were called by telephone at the time of interpretation on 09/18/2018 at 12:41 am to provider PA Margarita Mail , who verbally acknowledged these results. Electronically Signed   By: Lovena Le M.D.   On: 09/18/2018 00:43   Dg Chest Portable 1 View  Result Date: 09/17/2018 CLINICAL DATA:  Acute shortness of breath. History of breast and lung cancer. EXAM: PORTABLE CHEST 1 VIEW COMPARISON:  08/06/2018 and prior radiographs FINDINGS: LEFT pleural effusion and LEFT lung opacities are unchanged. Increased interstitial opacities within the RIGHT lung are noted. Probable trace RIGHT pleural effusion noted. A RIGHT Port-A-Cath is again identified with tip overlying the LOWER SVC.  No pneumothorax. IMPRESSION: Increased RIGHT lung interstitial opacities, favor edema over infection. Probable trace RIGHT pleural effusion. Unchanged LEFT pleural effusion and diffuse LEFT lung opacities. Electronically Signed   By: Margarette Canada M.D.   On: 09/17/2018 18:00        Scheduled Meds:  Chlorhexidine Gluconate Cloth  6 each Topical Daily   ipratropium-albuterol  3 mL Nebulization Q6H    morphine injection  1 mg Intravenous Once   Continuous Infusions:  dextrose 5 % and 0.45% NaCl 75 mL/hr at 09/18/18 1112   heparin 1,300 Units/hr (09/18/18 1112)   piperacillin-tazobactam (ZOSYN)  IV 12.5 mL/hr at 09/18/18 0631   potassium chloride     vancomycin       LOS: 1 day    Time spent: 35 mins.More than 50% of that time was spent in counseling and/or coordination of care.      Shelly Coss, MD [facility] Pager [phone]  If 7PM-7AM, please contact night-coverage [email or web address] Password [facility] 09/18/2018, 12:53 PM

## 2018-09-18 NOTE — Consult Note (Signed)
Palliative Medicine   Name: Deborah Mcintyre Date: 09/18/2018 MRN: [id number]  DOB: 08/22/1955  Patient Care Team: [name], MD as PCP - General (Internal Medicine) Nicholas Lose, MD as Consulting Physician (Hematology and Oncology) Alphonsa Overall, MD as Consulting Physician (General Surgery) Causey, Charlestine Massed, NP as Nurse Practitioner (Hematology and Oncology) Kyung Rudd, MD as Consulting Physician (Radiation Oncology)    REASON FOR CONSULTATION: Palliative Care consult requested for this 63 y.o. female with multiple medical problems including stage IV breast cancer with history of malignant recurrent pleural effusion, status post Pleurx catheter but was ultimately removed due to loculation, recently diagnosed with PE, O2 dependent (4 L) COPD, who is followed at home by hospice.  Patient was admitted to the hospital on 09/17/2018 with acute on chronic respiratory failure.  CT scan revealed small residual PE in the right lower lobe, and increase in loculated pleural effusion on the right, extensive left pleural thickening, and increased bilateral consolidative opacities concerning for multifocal pneumonia but could reflect possible underlying malignancy.  Patient has been seen by pulmonary and is felt to have exhausted all options for medical treatment.  Palliative care was consulted to help address goals.   SOCIAL HISTORY:     reports that she has been smoking cigarettes. She has a 33.75 pack-year smoking history. She has never used smokeless tobacco. She reports current alcohol use. She reports that she does not use drugs.   Patient is married and lives at home with her husband.  She is followed by [facility] hospice.  She has 6 children.  ADVANCE DIRECTIVES:  Not on file  CODE STATUS: DNR  PAST MEDICAL HISTORY: Past Medical History:  Diagnosis Date   Anxiety    Arthritis    Bronchitis    COPD (chronic obstructive pulmonary disease) ([place])    Depression      Hypertension    Malignant neoplasm of upper-outer quadrant of left female breast ([place]) 01/04/2015   Nocturia    Numbness and tingling    toes - bilateral   Pneumonia    PONV (postoperative nausea and vomiting)    Nausea    PAST SURGICAL HISTORY:  Past Surgical History:  Procedure Laterality Date   BREAST LUMPECTOMY Bilateral    x3   BREAST LUMPECTOMY WITH RADIOACTIVE SEED LOCALIZATION Right 08/23/2015   Procedure: RIGHT BREAST LUMPECTOMY WITH RADIOACTIVE SEED LOCALIZATION;  Surgeon: Alphonsa Overall, MD;  Location: [place];  Service: General;  Laterality: Right;   CESAREAN SECTION     x4   IR GUIDED DRAIN W CATHETER PLACEMENT  06/02/2018   IR IMAGING GUIDED PORT INSERTION  05/25/2018   IR REMOVAL OF PLURAL CATH W/CUFF  07/28/2018   IR REMOVAL TUN ACCESS W/ PORT W/O FL MOD SED  10/20/2016   IR THORACENTESIS ASP PLEURAL SPACE W/IMG GUIDE  05/12/2018   IR THORACENTESIS ASP PLEURAL SPACE W/IMG GUIDE  07/07/2018   MASTECTOMY Left 08/23/2015    RIGHT BREAST LUMPECTOMY WITH RADIOACTIVE SEED LOCALIZATION,  LEFT MASTECTOMY WITH AXILLARY LYMPH NODE DISSECTION   MASTECTOMY WITH AXILLARY LYMPH NODE DISSECTION Left 08/23/2015   Procedure: LEFT MASTECTOMY WITH AXILLARY LYMPH NODE DISSECTION;  Surgeon: Alphonsa Overall, MD;  Location: [place];  Service: General;  Laterality: Left;   MULTIPLE TOOTH EXTRACTIONS      HEMATOLOGY/ONCOLOGY HISTORY:  Oncology History  Breast cancer of upper-outer quadrant of left female breast ([place])  12/19/2014 Mammogram   Left breast palpable mass at 2:00: 4 x 3.8 x 2.5 cm, enlarged  left axillary lymph node 4 cm in size with diffuse skin thickening [place] 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   12/26/2014 Initial Diagnosis   Left breast biopsy: Invasive ductal carcinoma with lymphovascular invasion, 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, grade 3, ER/PR negative, Ki-67 80% HER-2 Neg   01/18/2015 - 06/21/2015 Neo-Adjuvant  Chemotherapy   Dose dense Adriamycin and Cytoxan 4 followed by Taxol and carboplatin [id number]   07/03/2015 Breast MRI   Improvement in multiple areas of enhancement largest area 3.4 cm other suspicious nodules anterior to the mass also improved, left axillary internal mammary lymph nodes improved   08/23/2015 Surgery   Left mastectomy with axillary lymph node dissection: Invasive ductal carcinoma with calcifications grade 3, 1.3 cm, image, margins negative, 1/6 lymph nodes positive with extracapsular extension, ER 0%, 0%, T1cN1a stage II a    10/31/2015 - 01/08/2016 Radiation Therapy   Adj XRT Lisbeth Renshaw): Left Chest Wall and Left SCLV treated to 50.4 Gy in 28 fractions of 1.8 Gy. Left Chest Wall was then boosted to 60.4 Gy in 5 fractions of 2 Gy.   02/03/2016 - 08/03/2016 Chemotherapy   Xeloda 1500 mg by mouth twice a day 2 weeks on one week off   02/04/2017 - 02/10/2017 Hospital Admission   Admitted for bilateral pneumonia   05/13/2018 Relapse/Recurrence   Left pleural effusion with left upper lobe consolidation and paratracheal and AP window lymphadenopathy: Thoracentesis: Cytology, adenocarcinoma consistent with breast primary, positive for GA TA-3 and CK7, negative for CK20 and TTF-1, ER PR HER-2 negative   06/01/2018 - 06/03/2018 Hospital Admission   Admitted for chest pain and shortness of breath status post thoracentesis 2.7 L removed and Pleurx catheter was placed cytology positive for adenocarcinoma   06/09/2018 -  Chemotherapy   The patient had pegfilgrastim (NEULASTA ONPRO KIT) injection 6 mg, 6 mg, Subcutaneous, Once, 1 of 4 cycles eriBULin mesylate (HALAVEN) 2.8 mg in sodium chloride 0.9 % 100 mL chemo infusion, 1.4 mg/m2 = 2.8 mg, Intravenous,  Once, 3 of 6 cycles Administration: 2.8 mg (06/09/2018), 2.8 mg (06/24/2018), 2.8 mg (07/07/2018), 2.8 mg (07/21/2018)  for chemotherapy treatment.    06/16/2018 Miscellaneous   [facility] molecular testing: MMR proficient, TMB indeterminate, triple  negative, ER negative, insufficient material for PDL 1, PTEN IHC positive mutation not detected     ALLERGIES:  is allergic to fentanyl and morphine and related.  MEDICATIONS:  Current Facility-Administered Medications  Medication Dose Route Frequency Provider Last Rate Last Dose   acetaminophen (TYLENOL) tablet 650 mg  650 mg Oral Q6H PRN Shela Leff, MD       Or   acetaminophen (TYLENOL) suppository 650 mg  650 mg Rectal Q6H PRN Shela Leff, MD       albuterol (PROVENTIL) (2.5 MG/3ML) 0.083% nebulizer solution 2.5 mg  2.5 mg Nebulization Q2H PRN Shela Leff, MD       Chlorhexidine Gluconate Cloth 2 % PADS 6 each  6 each Topical Daily Shela Leff, MD   6 each at 09/18/18 0835   dextrose 5 %-0.45 % sodium chloride infusion   Intravenous Continuous Shela Leff, MD 75 mL/hr at 09/18/18 1112     heparin ADULT infusion 100 units/mL (25000 units/2104m sodium chloride 0.45%)  1,300 Units/hr Intravenous Continuous RShela Leff MD 13 mL/hr at 09/18/18 1112 1,300 Units/hr at 09/18/18 1112   hydrALAZINE (APRESOLINE) injection 5 mg  5 mg Intravenous Q4H PRN RShela Leff MD       ipratropium-albuterol (DUONEB)  0.5-2.5 (3) MG/3ML nebulizer solution 3 mL  3 mL Nebulization Q6H Shela Leff, MD   3 mL at 09/18/18 0230   LORazepam (ATIVAN) injection 0.5 mg  0.5 mg Intravenous Q6H PRN Shela Leff, MD   0.5 mg at 09/18/18 1058   morphine 2 MG/ML injection 1 mg  1 mg Intravenous Once Shelly Coss, MD       morphine 2 MG/ML injection 1 mg  1 mg Intravenous Q3H PRN Shela Leff, MD       ondansetron (ZOFRAN) injection 4 mg  4 mg Intravenous Q6H PRN Shela Leff, MD       piperacillin-tazobactam (ZOSYN) IVPB 3.375 g  3.375 g Intravenous Q8H Shela Leff, MD 12.5 mL/hr at 09/18/18 0631     potassium chloride 10 mEq in 100 mL IVPB  10 mEq Intravenous Q1 Hr x 4 Adhikari, Amrit, MD       vancomycin (VANCOCIN) IVPB 750  mg/150 ml premix  750 mg Intravenous Q12H Shela Leff, MD        VITAL SIGNS: BP 107/84    Pulse (!) 123    Temp (!) 97.5 F (36.4 C) (Oral)    Resp (!) 22    Ht 5' 7.5" (1.715 m)    Wt 146 lb 6.2 oz (66.4 kg)    SpO2 100%    BMI 22.59 kg/m  Filed Weights   09/17/18 1802 09/18/18 0059  Weight: 163 lb (73.9 kg) 146 lb 6.2 oz (66.4 kg)    Estimated body mass index is 22.59 kg/m as calculated from the following:   Height as of this encounter: 5' 7.5" (1.715 m).   Weight as of this encounter: 146 lb 6.2 oz (66.4 kg).  LABS: CBC:    Component Value Date/Time   WBC 5.6 09/17/2018 1800   HGB 11.6 (L) 09/17/2018 1858   HGB 9.2 (L) 07/21/2018 1215   HGB 14.0 01/07/2017 1308   HCT 34.0 (L) 09/17/2018 1858   HCT 43.5 01/07/2017 1308   PLT 373 09/17/2018 1800   PLT 313 07/21/2018 1215   PLT 142 (L) 01/07/2017 1308   MCV 93.0 09/17/2018 1800   MCV 101.2 (H) 01/07/2017 1308   NEUTROABS 9.1 (H) 08/06/2018 0240   NEUTROABS 2.2 01/07/2017 1308   LYMPHSABS 0.8 08/06/2018 0240   LYMPHSABS 1.1 01/07/2017 1308   MONOABS 1.2 (H) 08/06/2018 0240   MONOABS 0.4 01/07/2017 1308   EOSABS 0.0 08/06/2018 0240   EOSABS 0.1 01/07/2017 1308   BASOSABS 0.0 08/06/2018 0240   BASOSABS 0.0 01/07/2017 1308   Comprehensive Metabolic Panel:    Component Value Date/Time   NA 142 09/18/2018 0630   NA 142 01/07/2017 1308   K 3.3 (L) 09/18/2018 0630   K 3.9 01/07/2017 1308   CL 94 (L) 09/18/2018 0630   CO2 34 (H) 09/18/2018 0630   CO2 26 01/07/2017 1308   BUN <5 (L) 09/18/2018 0630   BUN 9.7 01/07/2017 1308   CREATININE 0.49 09/18/2018 0630   CREATININE 0.59 07/21/2018 1215   CREATININE 0.8 01/07/2017 1308   GLUCOSE 151 (H) 09/18/2018 0630   GLUCOSE 107 01/07/2017 1308   CALCIUM 8.0 (L) 09/18/2018 0630   CALCIUM 9.2 01/07/2017 1308   AST 13 (L) 09/17/2018 1800   AST 12 (L) 07/21/2018 1215   AST 12 01/07/2017 1308   ALT 8 09/17/2018 1800   ALT <6 07/21/2018 1215   ALT 8 01/07/2017 1308     ALKPHOS 56 09/17/2018 1800   ALKPHOS 69 01/07/2017 1308  BILITOT 1.4 (H) 09/17/2018 1800   BILITOT 0.2 (L) 07/21/2018 1215   BILITOT 0.40 01/07/2017 1308   PROT 5.5 (L) 09/17/2018 1800   PROT 7.4 01/07/2017 1308   ALBUMIN 2.4 (L) 09/17/2018 1800   ALBUMIN 3.9 01/07/2017 1308    RADIOGRAPHIC STUDIES: Ct Angio Chest Pe W And/or Wo Contrast  Result Date: 09/18/2018 CLINICAL DATA:  Worsening shortness of breath, recent PE EXAM: CT ANGIOGRAPHY CHEST WITH CONTRAST TECHNIQUE: Multidetector CT imaging of the chest was performed using the standard protocol during bolus administration of intravenous contrast. Multiplanar CT image reconstructions and MIPs were obtained to evaluate the vascular anatomy. CONTRAST:  27m OMNIPAQUE IOHEXOL 350 MG/ML SOLN COMPARISON:  CT 07/27/2018 FINDINGS: Cardiovascular: There is satisfactory opacification of the pulmonary arteries. Small amount of residual embolus again seen in the medial basal segmental pulmonary artery of the right lower lobe. There has been progressive narrowing and now abrupt truncation at the origin of the lingular and upper lobe pulmonary arteries. Normal heart size. Small pericardial effusion. Reflux of contrast into the hepatic veins. Elevation of the RV/LV ratio (1.86). Right jugular Port-A-Cath tip terminates at the superior cavoatrial junction. Borderline dilation of the ascending thoracic aorta up to 4 cm. Returns to a normal caliber of 2.9 cm at the level of the arch. No dissection flap or acute aortic abnormality. Atheromatous plaque throughout thoracic aorta. Mediastinum/Nodes: Redemonstration of the multiple enlarged mediastinal lymph nodes. These appear grossly stable from recent comparison CT. Reference lymph nodes include a 13 mm short axis precarinal lymph node (5/53) and a 23 mm subcarinal nodal complex (5/68). Lungs/Pleura: There is complete collapse of the left upper lobe with heterogeneous attenuation of the parenchyma and abrupt  truncation of both the entering bronchi and pulmonary arteries. Adjacent sclerotic changes of the ribs are nonspecific could reflect post radiation changes. Subtotal collapse of the left lower lobe is present as well with increasing masslike opacity in the left lung base. The right hemithorax demonstrates multifocal areas masslike consolidation with surrounding ground-glass opacities which are significantly increased in number in size from comparison CT in July 2020 there is new moderate right pleural effusion. Upper Abdomen: Few borderline enlarged upper abdominal nodes are present anterior to the lesser curvature of the stomach. Reference nodes include a 14 mm gastrohepatic ligament lymph node (5/36). No other acute abnormality in the upper abdomen. Musculoskeletal: Postsurgical changes from prior mastectomy of the left chest wall. Mild circumferential body wall edema. No acute osseous abnormality. Review of the MIP images confirms the above findings. IMPRESSION: Small amount of residual pulmonary embolus in the right lower lobe. Persistent elevation of the RV/LV ratio and enlarged central pulmonary arteries. May reflect component of right heart strain mixed with chronic right heart failure Increase in size of the loculated right pleural effusion following removal of the Pleurx catheter seen previously. Extensive left pleural thickening. Increasing masslike consolidative opacities throughout both lungs with adjacent ground-glass concerning multifocal pneumonia given rapid progression. Follow-up imaging is recommended to exclude underlying malignancy/metastasis. New truncation of the left upper lobe airways and pulmonary arteries entering the airless lung. Heterogeneous attenuation the lung parenchyma is concerning for superinfection and/or necrosis. Extensive mediastinal, hilar upper abdominal adenopathy, reference nodes detailed above. Post mastectomy and post therapy changes of the left chest wall. These results  were called by telephone at the time of interpretation on 09/18/2018 at 12:41 am to provider PA AMargarita Mail, who verbally acknowledged these results. Electronically Signed   By: PElwin SleightD.  On: 09/18/2018 00:43   Dg Chest Portable 1 View  Result Date: 09/17/2018 CLINICAL DATA:  Acute shortness of breath. History of breast and lung cancer. EXAM: PORTABLE CHEST 1 VIEW COMPARISON:  08/06/2018 and prior radiographs FINDINGS: LEFT pleural effusion and LEFT lung opacities are unchanged. Increased interstitial opacities within the RIGHT lung are noted. Probable trace RIGHT pleural effusion noted. A RIGHT Port-A-Cath is again identified with tip overlying the LOWER SVC. No pneumothorax. IMPRESSION: Increased RIGHT lung interstitial opacities, favor edema over infection. Probable trace RIGHT pleural effusion. Unchanged LEFT pleural effusion and diffuse LEFT lung opacities. Electronically Signed   By: Margarette Canada M.D.   On: 09/17/2018 18:00    PERFORMANCE STATUS (ECOG) : 4 - Bedbound  Review of Systems Unable to provide  Physical Exam General: critically ill appearing Cardiovascular: tachy Pulmonary: poor air movement GU: no suprapubic tenderness Extremities: no edema Skin: no rashes Neurological: poorly responsive  IMPRESSION: Patient is critically ill-appearing on BiPAP.  She is poorly responsive and I am unable to engage with her meaningfully in conversation.  I met with patient's husband who is at bedside.  Husband says that patient has been declining at home and that he recognizes that she is very likely at end-of-life.  He verbalized feeling that patient is currently "suffering."  We discussed the option of transitioning patient to comfort care and stopping all non-comfort related medications and treatment.  He seemed open to the idea but said that he first needed to speak with his children before making any decisions.  He left the hospital so that he could meet with his children and  says that he will return later today.  I did discuss CODE STATUS with husband.  He confirmed that he would not want patient to be resuscitated nor intubated.  He was in agreement with DNR/DNI.  I called and spoke with the hospice liaison.  Husband had asked about taking patient home with hospice but I am unsure if she would be stable enough for discharge or if family would have enough support to manage her symptoms adequately in the home.  Patient would be a reasonable candidate for residential hospice but there are no beds currently available.  PLAN: -DNR -Recommend comfort care   Time Total: 60 minutes  Visit consisted of counseling and education dealing with the complex and emotionally intense issues of symptom management and palliative care in the setting of serious and potentially life-threatening illness.Greater than 50%  of this time was spent counseling and coordinating care related to the above assessment and plan.  Signed by: Altha Harm, PhD, NP-C [phone] (Work Cell)

## 2018-09-18 NOTE — Progress Notes (Addendum)
PCCM SIGNIFICANT EVENT    I, Dr Seward Carol have personally reviewed patient's available data, including medical history, events of note, physical examination and test results as part of my evaluation. I have discussed with NP Moshe Cipro  and other care providers such as pharmacist, RN and Elink.  In addition,  I personally evaluated patient  63 yr old F w/ PMHx significant for metastatic breast cancer (diagnosed 12/2014) with recurrent malignant left pleural effusion loculated (failed drainage in July with PleurX) previously on hospice, also with the following comorbidities: hypertension, osteoarthritis, anxiety/ depression, COPD, ongoing tobacco abuse, and small residual PE in the right lower lobe.     Evaluated earlier by [facility] Team per Hospitalist request. Remained in [facility] under Dr Marlowe Sax as she was hemodynamically stable and not in impending distress. Called to bedside again this AM due to increased work of breathing  After extensive conversation and review of the patient's EMR (medical history, imaging, and previous documentation).  Based on her complete clinical picture, medical history and current clinical condition Deborah Mcintyre has an extremely poor prognosis for a meaningful survival. I have tried multiple times overnight to reach her husband by phone with no success.  She has evolving respiratory distress and I anticipate that she will eventually progress to respiratory failure. Endotracheal intubation will not enhance her chance for survival or change the outcome here. I believe that CPR and ACLS would be futile.   I propose DNR orders. I have asked Dr Duwayne Heck to perform an independent evaluation and review of the chart. If she agrees then we will place DNR orders.   Dr. Seward Carol Pulmonary Critical Care Medicine  09/18/2018 6:48 AM

## 2018-09-18 NOTE — Significant Event (Signed)
Rapid Response Event Note  Overview: Respiratory Distress  Initial Focused Assessment: I was call to assess the patient for respiratory distress, upon arrival [facility] MD was at the bedside. Ms. Chern was visibly in acute respiratory distress, she was profusely diaphoretic, HR was in the mid 150s, RR 45-50/min, 93% on 100% NRB, increased WOB, + use of accessory muscles, patient was able to say a few words, I am sure that she is oriented completely currently. Extremities cool to touch, SBP in the 160s- 170s, lung sounds - RT - completely diminished, poor air movement and LT - diminished with some air movement, coarse crackles. Patient was very anxious, air hungry, and appeared quite uncomfortable.  Interventions: -- BIPAP -- ABG -- Morphine 2 mg IV x 1 -- Lasix 40 mg IV x 1  Plan of Care: -- PCCM Team came to the bedside as well -- After Morphine, patient was more comfortable, HR now in the low 120s, mild tachypnea now but patient was breathing more comfortably. BP stabilized as well. -- PCM consulted, GOC needs to be addressed.   Event Summary:  Call Time 0544 Arrival Time 5038 End Time 0710   Zachery Niswander R

## 2018-09-18 NOTE — Progress Notes (Addendum)
Patient with anxiety/agitation, patient attempting to pull at lines and attempting to climb out of bed. Medication given for anxiety agitation see MAR. Will monitor patient. Deborah Mcintyre, Bettina Gavia rN

## 2018-09-18 NOTE — Consult Note (Addendum)
Consult requested my Dr. Gilford Raid  for code status/goals of care around code status.   [name] is a 63 year old woman with COPD, OA, HTN, Afib (?) metastatic breast cancer and malignant recurrent left pleural effusion, PE on eliquis, who was recently hospitalized for SOB and L chest pain, recurrent malignant effusion.      Admitted - 8/1-08/07/18 Thoracentesis only 300cc removed (loculated) CT surgery was consulted for VATS/Decortication for symptom management - not a surgical candidate due to PE and other comorbidities.  Discharged home with hospice, Physician had discussion:  "Palliative care meeting 8/2 at 6 PM.  Patient and husband were eager for patient to get back home.  I discussed with him goals for keeping her comfortable at home with home hospice and preventing recurrent hospitalization as unfortunately there is nothing more medical that we can offer her to extend her life."  Admitted 7/22-7/30 - Pleurx not functioning so removed.   Diagnosed in 12/2014 Stage 3B breast cancer, 01/2015-06/2015 chemotherapy, MRI follow up with Improvement in mass and LN size, 08/2015 mastectomy and ln dissection, 10/17 radiation, 2018 xeloda chemotherapy.   01/2017: b pna 05/2018: malignant effusion. L  Pleurex placed 06/2018 Chemotherapy  Seen in 07/2018 by onc on Eribulin q 2 weeks. Palliative chemotherapy   Patient unable to converse with me now.  On bipap, RR 30, only opens eyes to stimulation.  Moans Tachy to 110s.   ABG shows resp acidosis (mild) and hypoxia.   CT review:  Pleural effusion at base.  Extensive pleural thickening Parenchymal opacities B increased compared to prior.  Effusion does not look significantly larger compared to prior exams on my review.    Overall, Looks like she is progressively developing worsening respiratory failure as a complication of her end stage cancer.  CT surgery has determined she is not even a candidate for any further palliative procedures for the  pleural disease. Pleurx failed.  Thoracentesis recently failed to remove significant fluid.   IN reviewing her imaging, does not appear that thoracentesis would provide her with any significant improvement in symptoms or significant improvment in oxygenation at this time, predominant new findings on CT are parenchymal.  Pleural thickening and tumor anteriorly, as well as pleural effusion are consistent with recent prior scans.   At any rate, she has end stage metatstatic cancer and treatment goals have been palliative not curative. She is a hospice patient.    I agree at this point that very aggressive measures such as CPR and intubation would not prolong life in a meaningful way or increase her likelihood of survival to discharge.  I agree this type of care would be futile at this time.    DNR/DNI orders will be placed.  This is consistent with her hospice care.  Palliative care consult placed by primary team.   Was able to reach husband in am, notified her of how sick she is and that she is currently on bipap and not speaking.  He is headed in [place].  Attempted to call back after my evaluation and inform him that further aggressive interventions are not recommended in this situation, but I was not able to reach him.  Will try again soon if he has not yet arrived.

## 2018-09-18 NOTE — Progress Notes (Addendum)
ANTICOAGULATION CONSULT NOTE - Initial Consult  Pharmacy Consult for Heparin Indication: pulmonary embolus  Allergies  Allergen Reactions  . Fentanyl Nausea And Vomiting and Other (See Comments)    Headache  . Morphine And Related Nausea And Vomiting and Other (See Comments)    Headache    Patient Measurements: Height: 5' 7.5" (171.5 cm) Weight: 146 lb 6.2 oz (66.4 kg) IBW/kg (Calculated) : 62.75   Vital Signs: Temp: 97.5 F (36.4 C) (09/13 0358) Temp Source: Oral (09/13 0358) BP: 107/84 (09/13 1001) Pulse Rate: 123 (09/13 1001)  Labs: Recent Labs    09/17/18 1800 09/17/18 1858 09/17/18 2040 09/18/18 0126 09/18/18 0152 09/18/18 0630 09/18/18 0949  HGB 10.1* 11.6*  --   --   --   --   --   HCT 35.9* 34.0*  --   --   --   --   --   PLT 373  --   --   --   --   --   --   APTT  --   --   --   --  33  --   --   LABPROT  --   --   --   --  12.9  --   --   INR  --   --   --   --  1.0  --   --   HEPARINUNFRC  --   --   --  <0.10*  --   --  0.47  CREATININE 0.57  --   --   --   --  0.49  --   TROPONINIHS 8  --  9  --   --  16  --     Estimated Creatinine Clearance: 71.4 mL/min (by C-G formula based on SCr of 0.49 mg/dL).   Medical History: Past Medical History:  Diagnosis Date  . Anxiety   . Arthritis   . Bronchitis   . COPD (chronic obstructive pulmonary disease) ([place])   . Depression   . Hypertension   . Malignant neoplasm of upper-outer quadrant of left female breast ([place]) 01/04/2015  . Nocturia   . Numbness and tingling    toes - bilateral  . Pneumonia   . PONV (postoperative nausea and vomiting)    Nausea    Medications:  No current facility-administered medications on file prior to encounter.    Current Outpatient Medications on File Prior to Encounter  Medication Sig Dispense Refill  . albuterol (PROVENTIL) (2.5 MG/3ML) 0.083% nebulizer solution Take 3 mLs (2.5 mg total) by nebulization every 6 (six) hours as needed for up to 30 days for wheezing  or shortness of breath. 75 mL 0  . albuterol (VENTOLIN HFA) 108 (90 Base) MCG/ACT inhaler Inhale 2 puffs into the lungs every 6 (six) hours as needed for wheezing or shortness of breath. 108 g 2  . apixaban (ELIQUIS) 5 MG TABS tablet Take 1 tablet (5 mg total) by mouth 2 (two) times daily. 60 tablet 1  . aspirin EC 81 MG tablet Take 81 mg by mouth daily.    [name] dronabinol (MARINOL) 2.5 MG capsule Take 1 capsule (2.5 mg total) by mouth 2 (two) times daily before a meal. 60 capsule 0  . gabapentin (NEURONTIN) 400 MG capsule Take 1 capsule (400 mg total) by mouth 3 (three) times daily. 30 capsule 0  . lidocaine-prilocaine (EMLA) cream Apply to affected area once (Patient taking differently: Apply 1 application topically daily as needed (port access). ) 30  g 3  . metoprolol tartrate (LOPRESSOR) 25 MG tablet Take 0.5 tablets (12.5 mg total) by mouth 2 (two) times daily. 30 tablet 0  . Multiple Vitamin (MULTIVITAMIN WITH MINERALS) TABS tablet Take 1 tablet by mouth daily. 30 tablet 0  . nicotine (NICODERM CQ - DOSED IN MG/24 HOURS) 21 mg/24hr patch Place 1 patch (21 mg total) onto the skin daily. 28 patch 0  . ondansetron (ZOFRAN) 8 MG tablet Take 1 tablet (8 mg total) by mouth every 8 (eight) hours as needed for nausea. 30 tablet 3  . oxyCODONE-acetaminophen (PERCOCET/ROXICET) 5-325 MG tablet Take 1 tablet by mouth every 4 (four) hours as needed for severe pain.    [name] PARoxetine (PAXIL) 20 MG tablet Take 1 tablet (20 mg total) by mouth 2 (two) times daily. 60 tablet 1  . potassium chloride (K-DUR) 10 MEQ tablet Take 2 tablets (20 mEq total) by mouth daily. 10 tablet 0  . PREMARIN vaginal cream INSERT 1 APPLICATORFUL VAGINALLY DAILY (Patient taking differently: Place 1 Applicatorful vaginally daily as needed (dryness). ) 30 g 12  . prochlorperazine (COMPAZINE) 10 MG tablet Take 1 tablet (10 mg total) by mouth every 6 (six) hours as needed (Nausea or vomiting). (Patient taking differently: Take 10 mg by mouth  every 6 (six) hours as needed for nausea or vomiting. ) 30 tablet 1  . promethazine (PHENERGAN) 12.5 MG tablet TAKE 1 TABLET(12.5 MG) BY MOUTH EVERY 6 HOURS AS NEEDED FOR NAUSEA OR VOMITING 90 tablet 0     Assessment: 63 y.o. female with h/o PE, (On Eliquis PTA - now on hold), pharmacy consulted for heparin.  Baseline heparin level undetectable, indicating possible noncompliance. Pt has PMH of COPD, OA, HTN, metastatic breast cancer and malignant recurrent left pleural effusion, who was recently hospitalized for SOB and L chest pain, recurrent malignant effusion  Heparin level is therapeutic this morning at 0.47 on heparin rate of 1,300 units/hr. Hg/Hct is stable. Plts are wnls. No issues with heparin infusion or bleeding per RN.   Goal of Therapy:  Heparin level 0.3-0.7 Monitor platelets by anticoagulation protocol: Yes   Plan:  Continue heparin at 1300 units/hr Confirmatory Heparin level in 6 hours Monitor daily heparin levels and CBC Watch for signs/symptoms of bleeding  Sherren Kerns, PharmD PGY1 Acute Care Pharmacy Resident [phone] 09/18/2018,10:46 AM

## 2018-09-18 NOTE — Progress Notes (Signed)
Patient refusing to keep on bipap mask at this time, spoke with Dr. Duwayne Heck who is in the room seeing patient, OK to place patient on [place], patient placed at 6 L [place] after speaking with respiratory therapist to make aware patient would not keep bipap mask on. [place] Folden, Bettina Gavia RN

## 2018-09-18 NOTE — Consult Note (Signed)
NAME:  Deborah Mcintyre, MRN:  [id number], DOB:  09/16/1955, LOS: 1 ADMISSION DATE:  09/17/2018, CONSULTATION DATE:  09/18/2018 REFERRING MD:  Dr. Marlowe Sax, CHIEF COMPLAINT:  Resp distress  Brief History   29 yoF with metastatic breast cancer with loculated malignant pleural effusion and chronic PE presenting with ongoing SOB and left sided chest pain.  PCCM consulted for concern of respiratory failure given patient has been on hospice but remains a full code.    History of present illness    HPI obtained from medical chart review as patient is encephalopathic and cooperation is limited.   63 year old female with past medical history significant for metastatic breast cancer (diagnosed 12/2014) with recurrent malignant left pleural effusion previously on hospice, hypertension, osteoarthritis, anxiety/ depression, COPD, ongoing tobacco abuse, and PE.      Reportedly stopped taking her Eliquis 3 weeks ago by instruction of her oncologist although not clearly documented.  Noted to have hospitalizations every month since May.  Most recent hospitalization ongoing left sided chest pain and SOB after having her non functioning pleurx removed in July.   Underwent thoracentesis 8/1 with only 300 ml output given loculated effusion with multiple septations but had ongoing symptoms and was deemed not be a surgical candidate.  She has had prior frank discussions on her last admission concerning goals of care and palliative consults and remains a full code despite her poor prognosis and nothing further to be medically offered to extend her life.     She presented tonight with ongoing shortness of breath and left sided chest pain.  Found to be tachycardic, tachypneic, normotensive, and requiring supplemental O2.  Workup notable for ABG pH 7.47, PCO2 55, and PO2 68, COVID negative, afebrile, no leukocytosis, negative hs-troponin x 2, normal BNP with CXR showing increased right lung interstitial opacities and probable  trace right effusion with unchanged left pleural effusion and diffuse opacities.  CTA PE showed residual unchanged PE.  PCCM consulted for concern of respiratory failure given patient remains a full code.    Past Medical History  Metastatic breast cancer with malignant left pleural effusion, hypertension, osteoarthritis, anxiety/ depression, COPD, and PE on Eliquis  She is followed by Dr. Lindi Adie with oncology  [facility] Events   9/13 Admitted to [facility]  Consults:   Procedures:   Significant Diagnostic Tests:  09/18/2018 CTA PE >> 1.  Small amount of residual pulmonary embolus in the right lower lobe. Persistent elevation of the RV/LV ratio and enlarged central pulmonary arteries. May reflect component of right heart strain mixed with chronic right heart failure 2.  Increase in size of the loculated right pleural effusion following removal of the Pleurx catheter seen previously. Extensive left pleural thickening. 3.  Increasing masslike consolidative opacities throughout both lungs with adjacent ground-glass concerning multifocal pneumonia given rapid progression. Follow-up imaging is recommended to exclude underlying malignancy/metastasis. 4.  New truncation of the left upper lobe airways and pulmonary arteries entering the airless lung. Heterogeneous attenuation the lung parenchyma is concerning for superinfection and/or necrosis. 5.  Extensive mediastinal, hilar upper abdominal adenopathy, reference nodes detailed above. 6.  Post mastectomy and post therapy changes of the left chest wall.   Micro Data:  9/12 SARS CoV2 >> neg  Antimicrobials:   Interim history/subjective:   Objective   Blood pressure (!) 144/85, pulse (!) 115, temperature 97.9 F (36.6 C), temperature source Oral, resp. rate (!) 23, height 5' 7.5" (1.715 m), weight 66.4 kg, SpO2 99 %.  Intake/Output Summary (Last 24 hours) at 09/18/2018 0159 Last data filed at 09/18/2018 0122 Gross per  24 hour  Intake 151.06 ml  Output --  Net 151.06 ml   Filed Weights   09/17/18 1802 09/18/18 0059  Weight: 73.9 kg 66.4 kg   Examination: General:  Chronically ill appearing female leaned over in bed upright with some increased WOB HEENT: MM pink/moist Neuro: Wakes up easily, cooperation is limited, MAE CV: RR, no murmur PULM:  Mild distress/ tachypnea, saturating well on 5L [place], faint exp wheeze throughout otherwise clear, diminished on left  GI: soft, bs active  Extremities: warm/dry, no LE edema  Skin: no rashes, previous left mastectomy   Resolved Hospital Problem list    Assessment & Plan:   Acute on chronic hypoxic/ hypercarbic respiratory failure in the setting of metastatic breast cancer and known/ recurrent left loculated pleural effusion and chronic PE (?off her Eliquis for several weeks) Hx COPD with ongoing tobacco abuse- doubtful for acute exacerbation - she is not a surgical candidate - PE appears small and stable on CTA P:  - Very unfortunate situation.  Would not recommend any further procedures including intubation, CPR, thoracentesis (given known loculations), or escalation of care given she is an advanced metastatic disease process that can not be cured by man.  These procedures would only prolong her life, not likely to offer any benefit or improve her quality of life, and likely to induce further suffering.  However, patient does not wish to discuss goals of care at this time and states she is not ready to die.  She is currently hemodynamically stable other than mild tachypnea and is to remain in SDU.  We have attempted to contact her husband with no answer.  Would suggest to have frank discussions again with recommendations to clarify [place] and moving towards comfort care when appropiate.  BiPAP could be considered however given her anxiety with a face mask, would likely not tolerate it.   - Agree with continuing nebs, checking PCT, and ongoing anticoagulation therapy.    - Agree with ativan and any analgesics as needed to help reduce her suffering, however, should be cautious in that these can further exacerbate her hypercarbia and should be used judiciously.     Remainder per primary team.  Nothing further to add.  PCCM will sign off.  Please do not hesitate to call us back if we can be of any further assistance.   Labs   CBC: Recent Labs  Lab 09/17/18 1800 09/17/18 1858  WBC 5.6  --   HGB 10.1* 11.6*  HCT 35.9* 34.0*  MCV 93.0  --   PLT 373  --     Basic Metabolic Panel: Recent Labs  Lab 09/17/18 1800 09/17/18 1858  NA 140 139  K 3.2* 3.0*  CL 90*  --   CO2 33*  --   GLUCOSE 77  --   BUN <5*  --   CREATININE 0.57  --   CALCIUM 8.7*  --    GFR: Estimated Creatinine Clearance: 71.4 mL/min (by C-G formula based on SCr of 0.57 mg/dL). Recent Labs  Lab 09/17/18 1800  WBC 5.6    Liver Function Tests: Recent Labs  Lab 09/17/18 1800  AST 13*  ALT 8  ALKPHOS 56  BILITOT 1.4*  PROT 5.5*  ALBUMIN 2.4*   No results for input(s): LIPASE, AMYLASE in the last 168 hours. No results for input(s): AMMONIA in the last 168 hours.  ABG  Component Value Date/Time   PHART 7.472 (H) 09/17/2018 1858   PCO2ART 55.6 (H) 09/17/2018 1858   PO2ART 68.0 (L) 09/17/2018 1858   HCO3 40.6 (H) 09/17/2018 1858   TCO2 42 (H) 09/17/2018 1858   O2SAT 94.0 09/17/2018 1858     Coagulation Profile: No results for input(s): INR, PROTIME in the last 168 hours.  Cardiac Enzymes: No results for input(s): CKTOTAL, CKMB, CKMBINDEX, TROPONINI in the last 168 hours.  HbA1C: Hgb A1c MFr Bld  Date/Time Value Ref Range Status  08/04/2013 04:14 AM 6.1 (H) <5.7 % Final    Comment:    (NOTE)                                                                       According to the ADA Clinical Practice Recommendations for 2011, when HbA1c is used as a screening test:  >=6.5%   Diagnostic of Diabetes Mellitus           (if abnormal result is  confirmed) 5.7-6.4%   Increased risk of developing Diabetes Mellitus References:Diagnosis and Classification of Diabetes Mellitus,Diabetes AOZH,0865,78(IONGE 1):S62-S69 and Standards of Medical Care in         Diabetes - 2011,Diabetes XBMW,4132,44 (Suppl 1):S11-S61.    CBG: Recent Labs  Lab 09/17/18 1730 09/17/18 2040  GLUCAP 62* 76    Review of Systems:   Unable given patient participation.   Past Medical History  She,  has a past medical history of Anxiety, Arthritis, Bronchitis, COPD (chronic obstructive pulmonary disease) ([place]), Depression, Hypertension, Malignant neoplasm of upper-outer quadrant of left female breast ([place]) (01/04/2015), Nocturia, Numbness and tingling, Pneumonia, and PONV (postoperative nausea and vomiting).   Surgical History    Past Surgical History:  Procedure Laterality Date   BREAST LUMPECTOMY Bilateral    x3   BREAST LUMPECTOMY WITH RADIOACTIVE SEED LOCALIZATION Right 08/23/2015   Procedure: RIGHT BREAST LUMPECTOMY WITH RADIOACTIVE SEED LOCALIZATION;  Surgeon: Alphonsa Overall, MD;  Location: [place];  Service: General;  Laterality: Right;   CESAREAN SECTION     x4   IR GUIDED DRAIN W CATHETER PLACEMENT  06/02/2018   IR IMAGING GUIDED PORT INSERTION  05/25/2018   IR REMOVAL OF PLURAL CATH W/CUFF  07/28/2018   IR REMOVAL TUN ACCESS W/ PORT W/O FL MOD SED  10/20/2016   IR THORACENTESIS ASP PLEURAL SPACE W/IMG GUIDE  05/12/2018   IR THORACENTESIS ASP PLEURAL SPACE W/IMG GUIDE  07/07/2018   MASTECTOMY Left 08/23/2015    RIGHT BREAST LUMPECTOMY WITH RADIOACTIVE SEED LOCALIZATION,  LEFT MASTECTOMY WITH AXILLARY LYMPH NODE DISSECTION   MASTECTOMY WITH AXILLARY LYMPH NODE DISSECTION Left 08/23/2015   Procedure: LEFT MASTECTOMY WITH AXILLARY LYMPH NODE DISSECTION;  Surgeon: Alphonsa Overall, MD;  Location: [place];  Service: General;  Laterality: Left;   MULTIPLE TOOTH EXTRACTIONS       Social History   reports that she has been smoking cigarettes. She has a  33.75 pack-year smoking history. She has never used smokeless tobacco. She reports current alcohol use. She reports that she does not use drugs.   Family History   Her family history includes Breast cancer in her maternal aunt.   Allergies Allergies  Allergen Reactions   Fentanyl Nausea And Vomiting and Other (See  Comments)    Headache   Morphine And Related Nausea And Vomiting and Other (See Comments)    Headache     Home Medications  Prior to Admission medications   Medication Sig Start Date End Date Taking? Authorizing Provider  albuterol (PROVENTIL) (2.5 MG/3ML) 0.083% nebulizer solution Take 3 mLs (2.5 mg total) by nebulization every 6 (six) hours as needed for up to 30 days for wheezing or shortness of breath. 07/02/18 08/06/18  Alma Friendly, MD  albuterol (VENTOLIN HFA) 108 (90 Base) MCG/ACT inhaler Inhale 2 puffs into the lungs every 6 (six) hours as needed for wheezing or shortness of breath. 09/09/18   Nicholas Lose, MD  apixaban (ELIQUIS) 5 MG TABS tablet Take 1 tablet (5 mg total) by mouth 2 (two) times daily. 08/02/18 10/01/18  Cherylann Ratel A, DO  aspirin EC 81 MG tablet Take 81 mg by mouth daily.    [provider]  dronabinol (MARINOL) 2.5 MG capsule Take 1 capsule (2.5 mg total) by mouth 2 (two) times daily before a meal. 07/21/18   Causey, Charlestine Massed, NP  gabapentin (NEURONTIN) 400 MG capsule Take 1 capsule (400 mg total) by mouth 3 (three) times daily. 07/17/18   Kayleen Memos, DO  lidocaine-prilocaine (EMLA) cream Apply to affected area once Patient taking differently: Apply 1 application topically daily as needed (port access).  05/19/18   Nicholas Lose, MD  metoprolol tartrate (LOPRESSOR) 25 MG tablet Take 0.5 tablets (12.5 mg total) by mouth 2 (two) times daily. 08/04/18 09/03/18  Donne Hazel, MD  Multiple Vitamin (MULTIVITAMIN WITH MINERALS) TABS tablet Take 1 tablet by mouth daily. 07/18/18   Kayleen Memos, DO  nicotine (NICODERM CQ - DOSED IN  MG/24 HOURS) 21 mg/24hr patch Place 1 patch (21 mg total) onto the skin daily. 08/02/18   Cherylann Ratel A, DO  ondansetron (ZOFRAN) 8 MG tablet Take 1 tablet (8 mg total) by mouth every 8 (eight) hours as needed for nausea. 09/23/17   Nicholas Lose, MD  oxyCODONE-acetaminophen (PERCOCET/ROXICET) 5-325 MG tablet Take 1 tablet by mouth every 4 (four) hours as needed for severe pain.    [provider]  PARoxetine (PAXIL) 20 MG tablet Take 1 tablet (20 mg total) by mouth 2 (two) times daily. 08/02/18 10/01/18  Cherylann Ratel A, DO  potassium chloride (K-DUR) 10 MEQ tablet Take 2 tablets (20 mEq total) by mouth daily. 07/21/18   Gardenia Phlegm, NP  PREMARIN vaginal cream INSERT 1 APPLICATORFUL VAGINALLY DAILY Patient taking differently: Place 1 Applicatorful vaginally daily as needed (dryness).  04/19/17   Nicholas Lose, MD  prochlorperazine (COMPAZINE) 10 MG tablet Take 1 tablet (10 mg total) by mouth every 6 (six) hours as needed (Nausea or vomiting). Patient taking differently: Take 10 mg by mouth every 6 (six) hours as needed for nausea or vomiting.  05/19/18   Nicholas Lose, MD  promethazine (PHENERGAN) 12.5 MG tablet TAKE 1 TABLET(12.5 MG) BY MOUTH EVERY 6 HOURS AS NEEDED FOR NAUSEA OR VOMITING 09/01/18   Nicholas Lose, MD      Kennieth Rad, MSN, AGACNP-BC [facility] Pgr: [phone] or if no answer [phone] 09/18/2018, 3:32 AM

## 2018-09-18 NOTE — Progress Notes (Signed)
Went to check on pt hr increase from 110's to 150', Pt tripoding in the bed pt Diaphoretic Bs checked 124 Dr. Windell Hummingbird called, RRN paged , Pccm  MD and NP all at the bedside, pt placed on BiPap, orders carried out per MD.  Multiple attempts to call Husband by Mds.  Pt now more at ease HR 129, oxygen 98 on Bipap, Bp 105/79

## 2018-09-18 NOTE — Progress Notes (Signed)
Paged by nursing staff at around 5:40 AM stating patient is tachycardic and having increased work of breathing.  Patient was immediately seen and evaluated at bedside.  Tachycardic with heart rate in the 150s.  Significant respiratory distress, tripoding.  Extremities cool to touch.  BiPAP was placed.  Patient was given morphine and Lasix 40 mg.  Stat ABG showing pH 7.36, PCO2 64, and PO2 67.  BMP, troponin, BNP ordered and pending. Multiple attempts were made to contact the patient's husband but unfortunately I could not reach him over the phone.  Patient was also evaluated by critical care and they believe intubation will not increase her chance of survival.  CPR and ACLS would be futile care. Critical care also made attempts to contact the patient's husband but could not reach him.  After being placed on BiPAP and receiving morphine plus Lasix patient's heart rate improved to 120s, oxygen saturation 98-100%, and work of breathing has improved.  I have also updated daytime hospitalist on the patient's current status.

## 2018-09-18 NOTE — Progress Notes (Signed)
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  DME already at home:  Hospital bed and O2  Pt has oral morphine already as well.  Should any other comfort medications be anticipated, please arrange for those so there is no lapse in her comfort prior to hospice returning to see her.  Please use [facility] for transport, they contract this service for our active hospice pts.  Will update our on call staff that she will be returning so an emergent needs visit can be arranged.  [name] RN, BSN, [employer] [facility] Liaison (in [place]) [phone]

## 2018-09-18 NOTE — Progress Notes (Signed)
Pharmacy Antibiotic Note  Deborah Mcintyre is a 63 y.o. female admitted on 09/17/2018 with SOB/possible PNA.  Pharmacy has been consulted for Vancomycin and Zosyn dosing.  Plan: Vancomycin 1250 mg IV now, then 750 mg IV q12h Zosyn 3.375 g IV q8h   Height: 5' 7.5" (171.5 cm) Weight: 146 lb 6.2 oz (66.4 kg) IBW/kg (Calculated) : 62.75  Temp (24hrs), Avg:98.3 F (36.8 C), Min:97.9 F (36.6 C), Max:98.7 F (37.1 C)  Recent Labs  Lab 09/17/18 1800  WBC 5.6  CREATININE 0.57    Estimated Creatinine Clearance: 71.4 mL/min (by C-G formula based on SCr of 0.57 mg/dL).    Allergies  Allergen Reactions  . Fentanyl Nausea And Vomiting and Other (See Comments)    Headache  . Morphine And Related Nausea And Vomiting and Other (See Comments)    Headache     Caryl Pina 09/18/2018 2:23 AM

## 2018-09-18 NOTE — Progress Notes (Signed)
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  EDP contacted hospice to make aware of her arrival and to discuss her code status (at that time full code).  Deborah Mcintyre is under hospice services for a terminal diagnosis of metastatic breast cancer per Dr. Karie Georges.  She is admitted to the hospital with ARF.  This is a related hospital admission.  Attempted to discuss with spouse Deborah Mcintyre, unable to reach.  Exchanged report with bedside RN and PMT.  Patient and her spouse have not been accepting of her terminal diagnosis during their hospice tenure, repeated attempts trying to align with hospice philosophy.   Deborah Mcintyre is now DNR status.  Deborah Mcintyre is accepting of transitioning her to comfort care and would like to take her home.  Unclear if she would survive transport home at this time.  V/S: 107/84, HR 123, RR 22, SPO2 100 Bipap @ 40% I&O:  925/1200 Abnormal lab work:  pH 7.47, PO2 68, HCO3 40, BNP 147 Diagnostics:  Increased RIGHT lung interstitial opacities, favor edema over infection. Probable trace RIGHT pleural effusion. Unchanged LEFT pleural effusion and diffuse LEFT lung opacities.  Problem List: ARF-hypoxic and hypercapneic (see ABG); initially Bipap, now O2 [name]  GOC: Transition to full comfort, now DNR.  Deborah Mcintyre would like to take her home.  They have all necessary DME should this be the plan.   IDT: Updated Family:  Updated by hospital staff, unable to get in contact with Deborah Mcintyre D/C planning:  Unclear, possible hospital death.    Transfer summary and med list sent to unit to be placed on shadow chart.  [name] RN, BSN, [facility] Liaison (in [place])--please update if plans shift towards d/c today [phone]

## 2018-09-18 NOTE — Progress Notes (Addendum)
Patient  to be discharged home with hospice, patient discharge information placed in packet for EMS will be transported home by [facility], case manager called for pick up. IV and tele were dcd. IV team aware  Of need for deaccess of porta cath.  Family at bedside Will discharge home as ordered. Gladyes Kudo, Bettina Gavia RN

## 2018-09-18 NOTE — ED Notes (Signed)
Delay I lab draw.  Pt currently in CT.

## 2018-09-18 NOTE — Progress Notes (Signed)
Critical care doctor made aware that Husband in room. Through [facility] system. Will monitor patient. Ulyssa Walthour, Bettina Gavia rN

## 2018-09-18 NOTE — TOC Transition Note (Signed)
Transition of Care [facility]) - CM/SW Discharge Note   Patient Details  Name: Deborah Mcintyre MRN: [id number] Date of Birth: 1955/10/29  Transition of Care [facility]) CM/SW Contact:  Claudie Leach, RN Phone Number: 09/18/2018, 3:19 PM   Clinical Narrative:    Patient to d/c home to resume hospice care with [facility].  [name] has arranged for resumption of care.  Portal DNR with RN.  Medical Necessity form left with RN.  [facility] called to transport since patient is under hospice prior to hospitalization.     Final next level of care: Home w Hospice Care Barriers to Discharge: No Barriers Identified    Readmission Risk Interventions Readmission Risk Prevention Plan 07/28/2018 07/15/2018  Transportation Screening Complete Complete  Medication Review [employer]) Not Complete Complete  Med Review Comments 3 medications needing review per RN admission summary -  PCP or Specialist appointment within 3-5 days of discharge Not Complete Complete  PCP/Specialist Appt Not Complete comments DC date unknown -  HRI or [place] Complete Complete  SW Recovery Care/Counseling Consult Complete Complete  Palliative Care Screening Not Applicable Not [place] Not Applicable Not Applicable  Some recent data might be hidden

## 2018-09-18 NOTE — Progress Notes (Signed)
Follow up visit made. Patient's husband is at bedside. He says that he has talked with his children and that they would like to take patient home with hospice for end of life care. He verbalized an understanding that patient is at end of life and says he just wants to keep her comfortable. We discussed the option of residential hospice but he says he would prefer her at home.   Patient is off BIPAP and is currently responsive to questioning. She is somewhat agitated. She says "I know I am going to die and just want to go home." Husband again confirms that is what family would like.  Case discussed with Dr. Tawanna Solo. I also spoke with the hospice liaison, Anderson Malta.   Time Total: 15 minutes  Visit consisted of counseling and education dealing with the complex and emotionally intense issues of symptom management and palliative care in the setting of serious and potentially life-threatening illness.Greater than 50%  of this time was spent counseling and coordinating care related to the above assessment and plan.  Signed by: Altha Harm, PhD, NP-C [phone] (Work Cell)

## 2018-09-18 NOTE — Progress Notes (Addendum)
Called to bedside to speak with husband. Clarified what has been going on with her lately. "she needs to have the fluid drained since they took the tube out then she feels better" . I asked him about hospice discharge, and what he understands, he says "no one talks to me, but everyone has given up on her".   I explained that what I see is not avery significant change in her pleural effusion today, but increased infiltrates, and lots of pleural thickening and  Persistent collapse (vs mass?) of RUL. Effusion is not the predominant issue here, plus drainage has failed in the recent past.   I confirmed with him that goals are in fact to palliate and make her as comfortable as possible.  Seems he believed she was transferred here just for drainage.   At this time she is awake, Alert and oriented only x2 (thinks it is 2010) asking for water.  Slightly agitated.   My recommendation to him is comfort care.  I would not go ahead with thoracentesis right now, given findings above, as putting her through this procedure would be unlikely to provide any significant symptom relief.   She is asking for water, removing bipap; thus I recommended we take off bipap, focus on her comfort, let her have some sips of water.  Ativan for anxiety and agitation, morphine or other opiate for respiratory distress or pain (he tried to describe some issue she has had in the past with morphine, but I couldn't get to the bottom of what he was trying to say her reaction was - she tolerated a dose overnight).  I told him prognosis is likely hours to days.  He said he will need to call his children asapand I agreed.  He would like to take her home but I told him im not sure that will be possible.   Discussed removal of bipap, placement of [place], ativan and morphine or fentanyl for symptoms for the time being, but I will leave the comfort care order set and additional care to the primary team and or palliative care team.   Discussed with  hospitalist Dr. Tawanna Solo.  I suspect Jeneen Rinks (her husband) will benefit greatly from additional conversations about our expectations for her from a medical perspective, as well as additional support, thus I greatly appreciate palliative care assistance with this today.

## 2018-09-19 ENCOUNTER — Ambulatory Visit (HOSPITAL_COMMUNITY): Payer: Medicare Other

## 2018-09-26 ENCOUNTER — Ambulatory Visit: Payer: Medicare Other | Admitting: Hematology and Oncology

## 2018-10-06 DEATH — deceased

## 2021-06-23 IMAGING — CT CT CHEST WITHOUT CONTRAST
2 of 3 series · 15 of 36 positions shown, 18 images · non-contrast
Comparison: PET-CT dated June 15, 2018. CT chest dated June 01, 2018.

CLINICAL DATA: Increasing shortness of breath. History of
metastatic lung cancer.

EXAM:
CT CHEST WITHOUT CONTRAST
TECHNIQUE: Multidetector CT imaging of the chest was performed following the
standard protocol without IV contrast.

[Series 3: thorax · axial · 0.67mm/px · z∈[+1498,+1758]mm · 12 of 154 slices shown, 15 images]
[im 12/154  mediastinal]
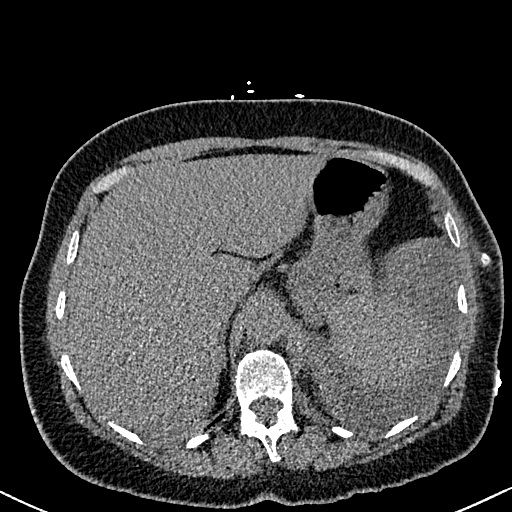
[im 12/154  lung]
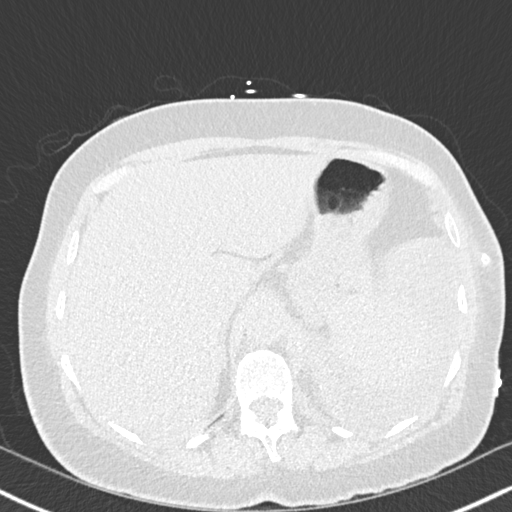
[im 23/154  lung]
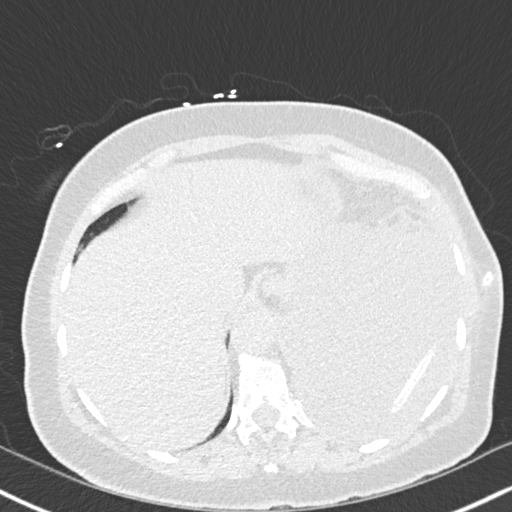
[im 35/154  lung]
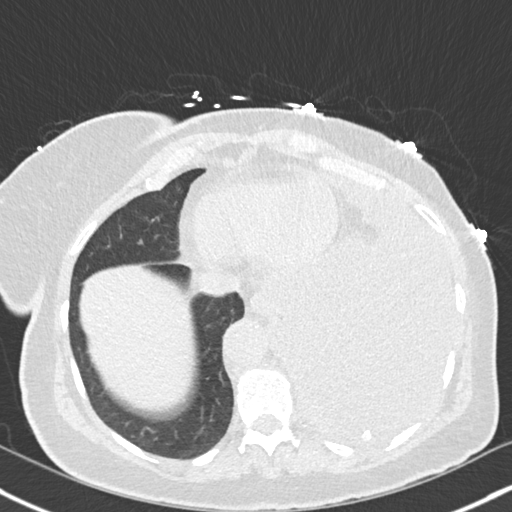
[im 46/154  lung]
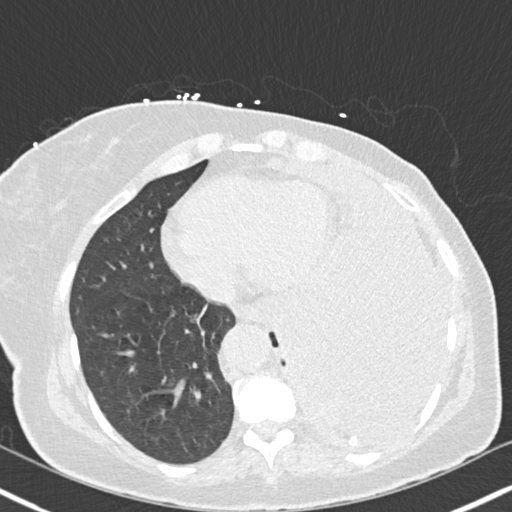
[im 57/154  mediastinal]
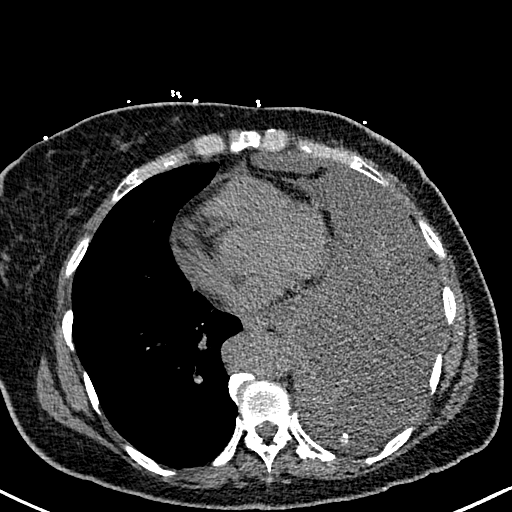
[im 57/154  lung]
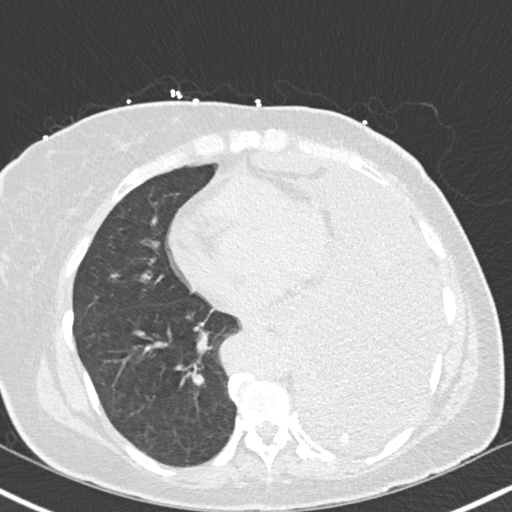
[im 69/154  lung]
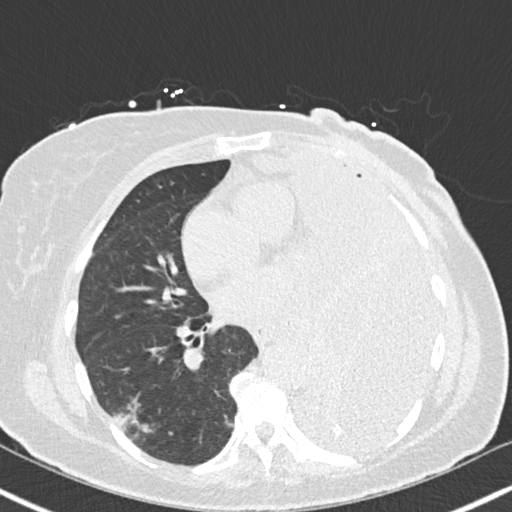
[im 86/154  lung]
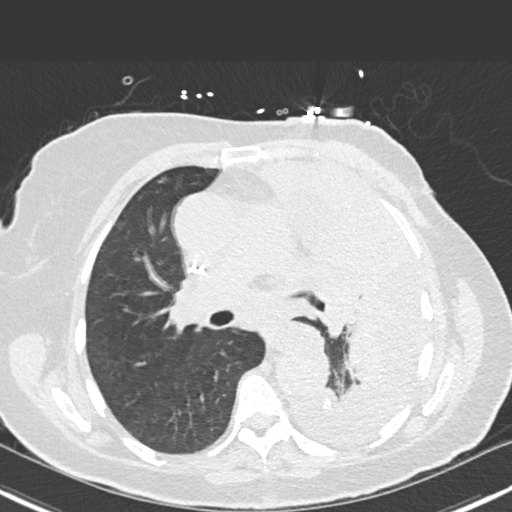
[im 97/154  lung]
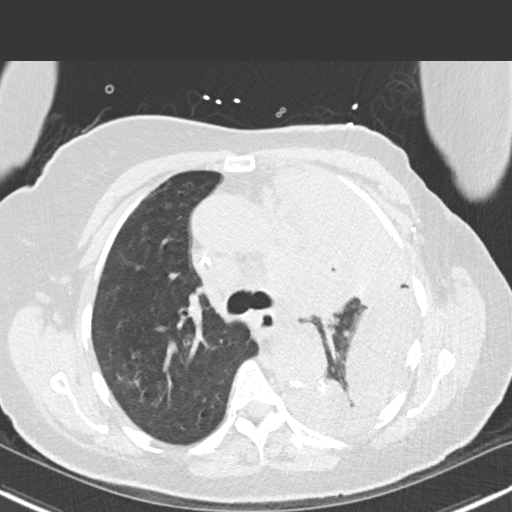
[im 108/154  mediastinal]
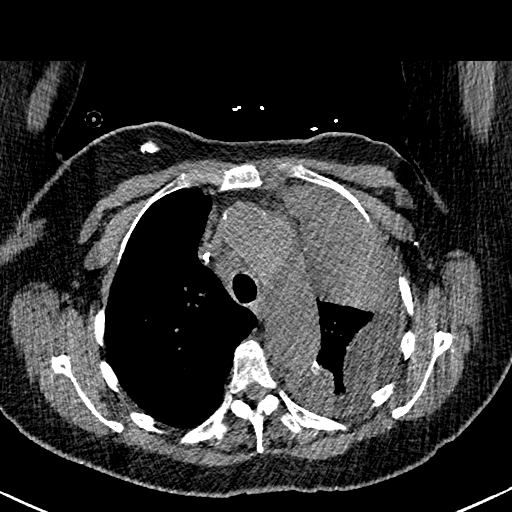
[im 108/154  lung]
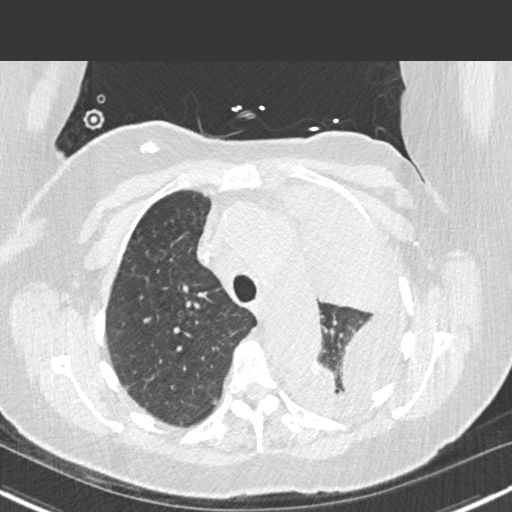
[im 120/154  lung]
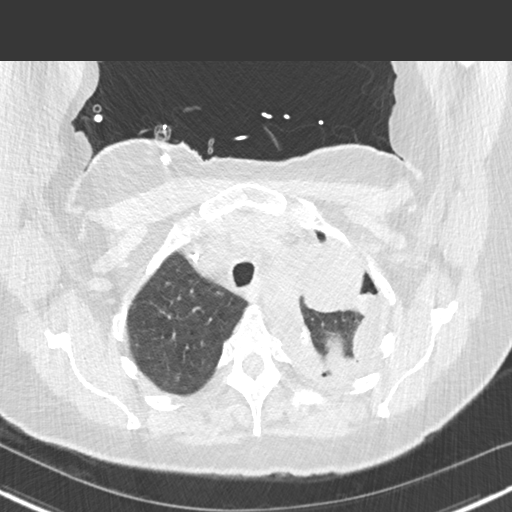
[im 131/154  lung]
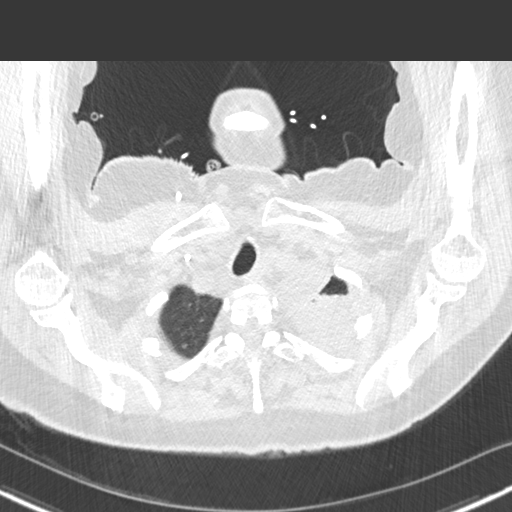
[im 142/154  lung]
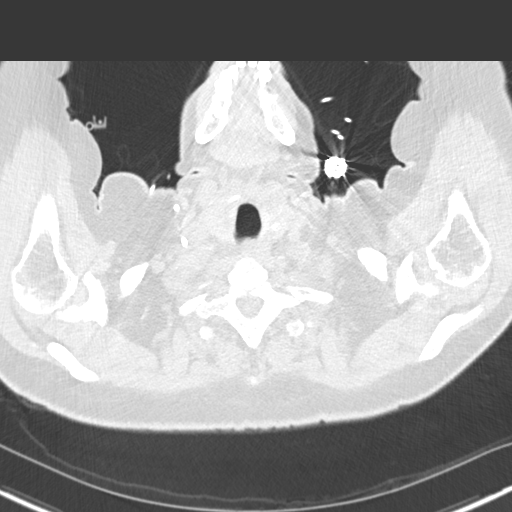

[Series 6: coronal · coronal · 0.63mm/px · 3 of 140 slices shown]
[im 28/140  lung]
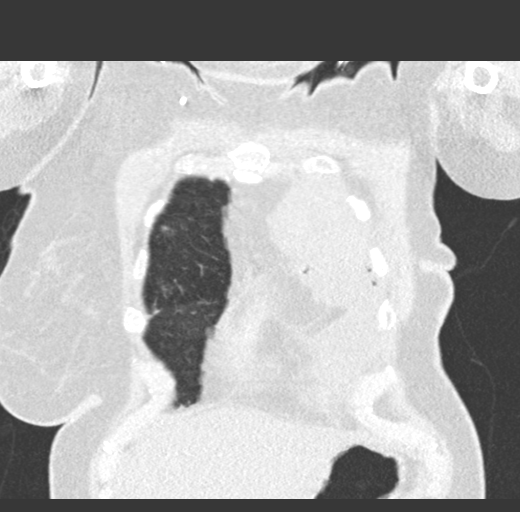
[im 56/140  lung]
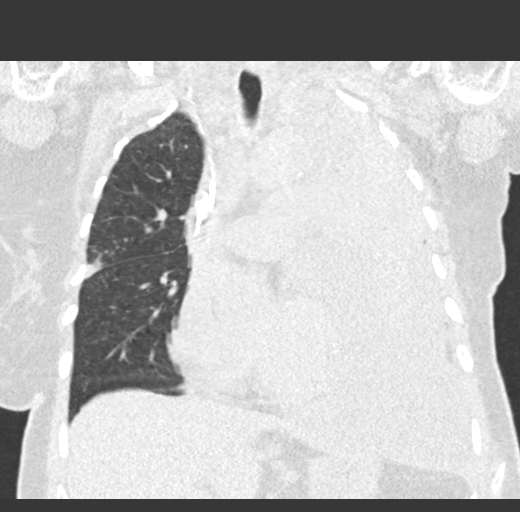
[im 84/140  lung]
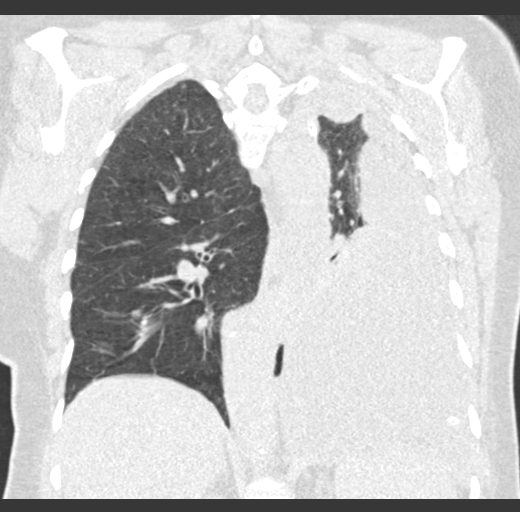

[15 of 36 positions shown; findings below may reference images not displayed]

FINDINGS: Cardiovascular: Normal heart size. No pericardial effusion.
Unchanged ascending thoracic aortic aneurysm measuring up to 4.1 cm.
Atherosclerotic calcification of the aortic arch and branch vessels.
Unchanged right chest wall port catheter.

Mediastinum/Nodes: Unchanged mediastinal lymphadenopathy. Index
precarinal lymph node measures 2.1 cm, previously 2.1 cm. Limited
evaluation known hilar lymphadenopathy due to lack of intravenous
contrast. The thyroid gland, trachea, and esophagus demonstrate no
significant findings.

Lungs/Pleura: The large mass in the anterior left upper lobe is
grossly unchanged. Enlarging loculated left pleural effusion with
increasing partial collapse of the left lower lobe. Left pleural
drainage catheter again noted. Scattered small foci of air in the
left pleural space are likely related to the drainage catheter.
Previously seen ill-defined nodular opacities in the peripheral
right upper and lower lobes are slightly more dense when compared to
recent PET.

Upper Abdomen: No acute abnormality.

Musculoskeletal: No chest wall mass or suspicious bone lesions
identified. Prior left mastectomy. Unchanged posterior midline
sebaceous cyst.
IMPRESSION: 1. Enlarging loculated left pleural effusion with increasing partial
collapse of the left lower lobe.
2. Grossly unchanged large left upper lobe mass and nodal
metastases.
3. Previously seen ill-defined nodular opacities in the peripheral
right upper and lower lobes are slightly more dense when compared to
recent PET-CT, and remain concerning for contralateral pulmonary
metastases.
4. Unchanged ascending thoracic aortic aneurysm measuring 4.1 cm.
Recommend annual imaging followup by CTA or MRA. This recommendation
follows 6525 ACCF/AHA/AATS/ACR/ASA/SCA/BRAYAN SOTERO/PANDOLFI/JUMPER/ANDERSON Guidelines
for the Diagnosis and Management of Patients with Thoracic Aortic
Disease. Circulation. 6525; 121: [id number]. Aortic aneurysm NOS
(XLXSS-2LS.H)
5.  Aortic atherosclerosis ([id number]-EJ2.2).

## 2021-07-07 IMAGING — CR CHEST - 2 VIEW
2 series · 2 of 2 positions shown · non-contrast
Comparison: CT chest 06/30/2018 and earlier. Chest x-ray
06/30/2018.

CLINICAL DATA: 63-year-old with current history of stage IV LEFT
lung cancer and chronic malignant LEFT pleural effusion with a
PleurX catheter in place. Patient presents stating that the PleurX
catheter is no longer draining and complains of acute shortness of
breath.

EXAM:
CHEST - 2 VIEW

[w chest pa]
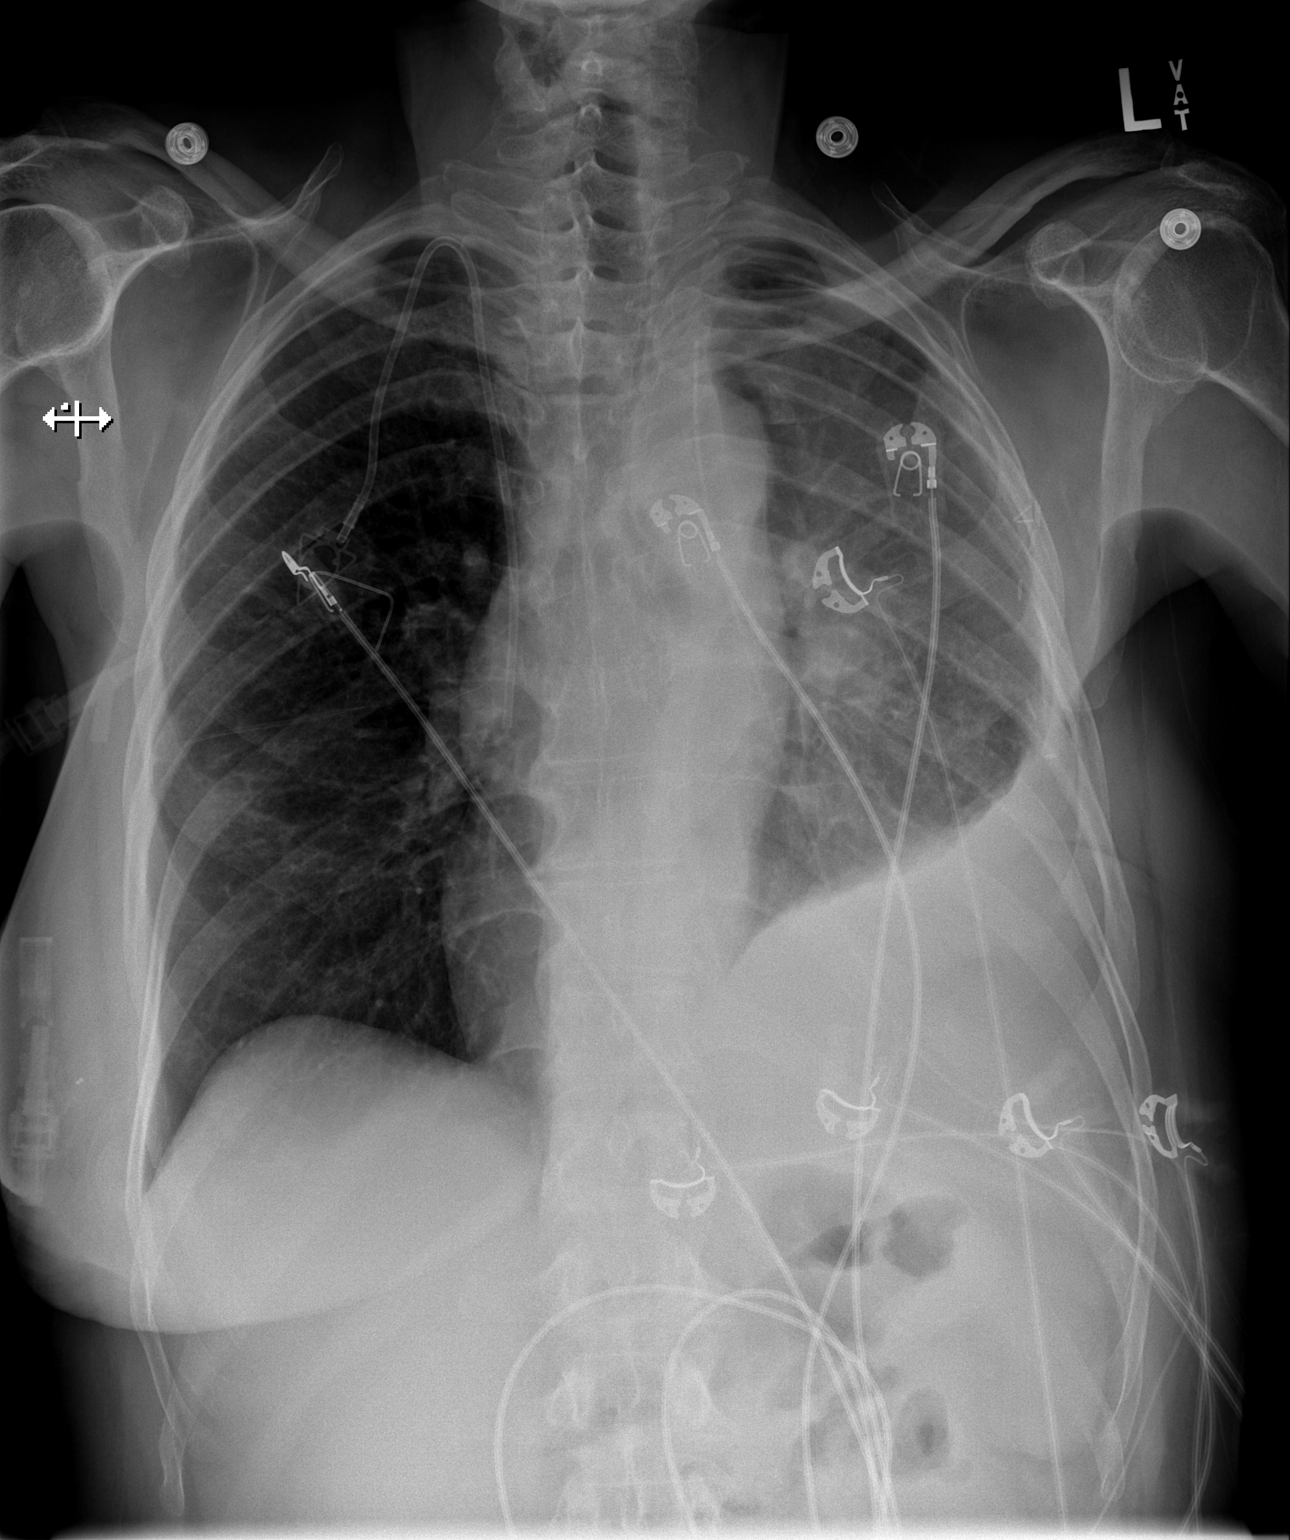

[w chest lat]
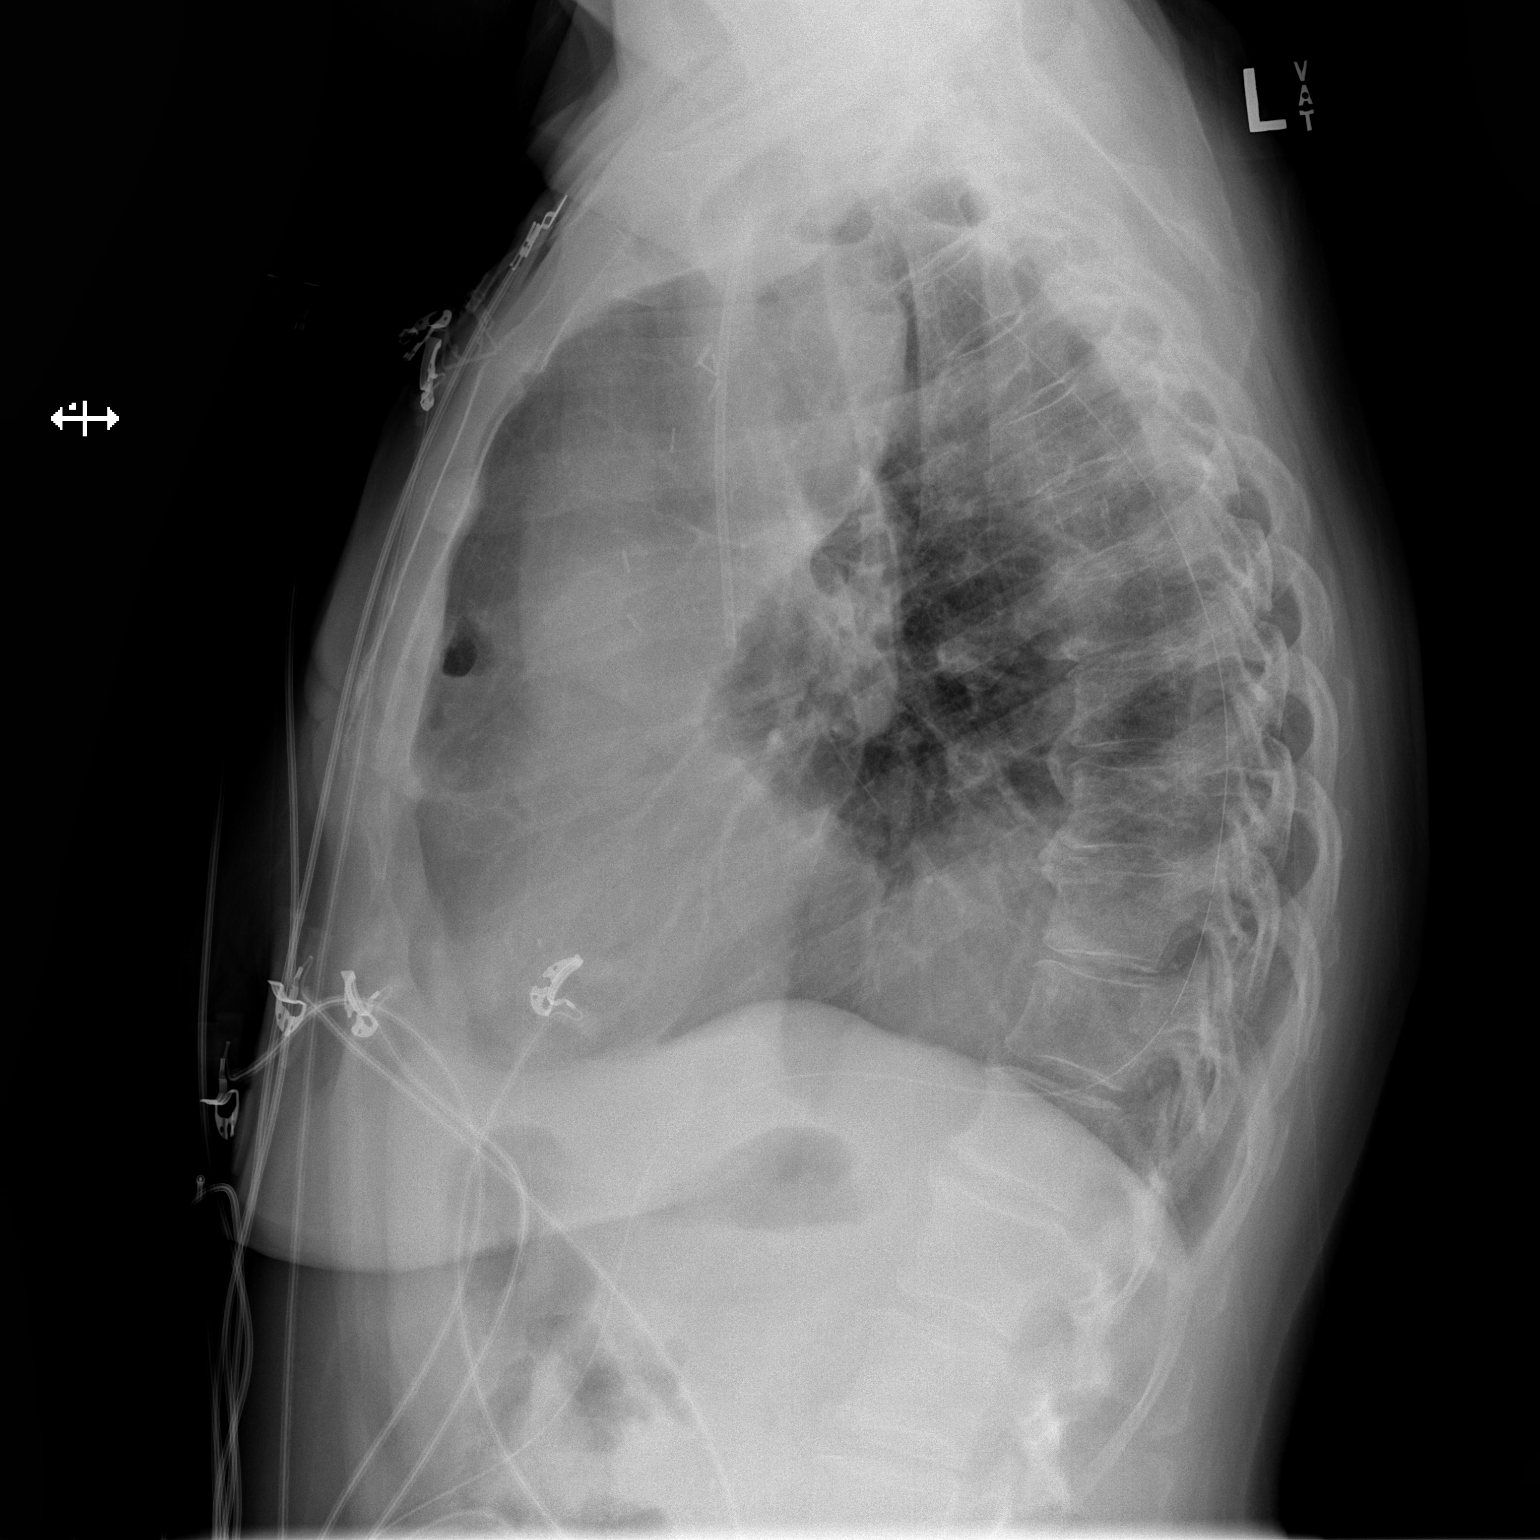

[2 of 2 positions shown; findings below may reference images not displayed]

FINDINGS: Moderate-sized LEFT pleural effusion, localized to the base and mid
LEFT hemithorax. The PleurX catheter is positioned posteriorly and
medially in the LEFT hemithorax. Associated consolidation in the
LEFT LOWER LOBE and lingula. Small nodule involving the RIGHT mid
lung as noted on the recent CT. RIGHT lung otherwise clear. No RIGHT
pleural effusion.

Cardiac silhouette normal in size, unchanged. Thoracic aorta mildly
atherosclerotic, unchanged. RIGHT jugular Port-A-Cath tip in the
LOWER SVC. Degenerative changes involving thoracic spine.
IMPRESSION: 1. Moderate-sized LEFT pleural effusion localized to the base and
mid LEFT hemithorax. Associated passive atelectasis and/or pneumonia
involving the LEFT LOWER LOBE and lingula.
2. The PleurX catheter is positioned posteriorly and medially in the
LEFT hemithorax.
3. Small nodule involving the RIGHT mid lung indicating metastasis,
as noted on the recent CT.

## 2021-09-10 IMAGING — DX DG CHEST 1V PORT
1 series · 1 of 1 positions shown · non-contrast
Comparison: 08/06/2018 and prior radiographs

CLINICAL DATA: Acute shortness of breath. History of breast and
lung cancer.

EXAM:
PORTABLE CHEST 1 VIEW

[chest ap]
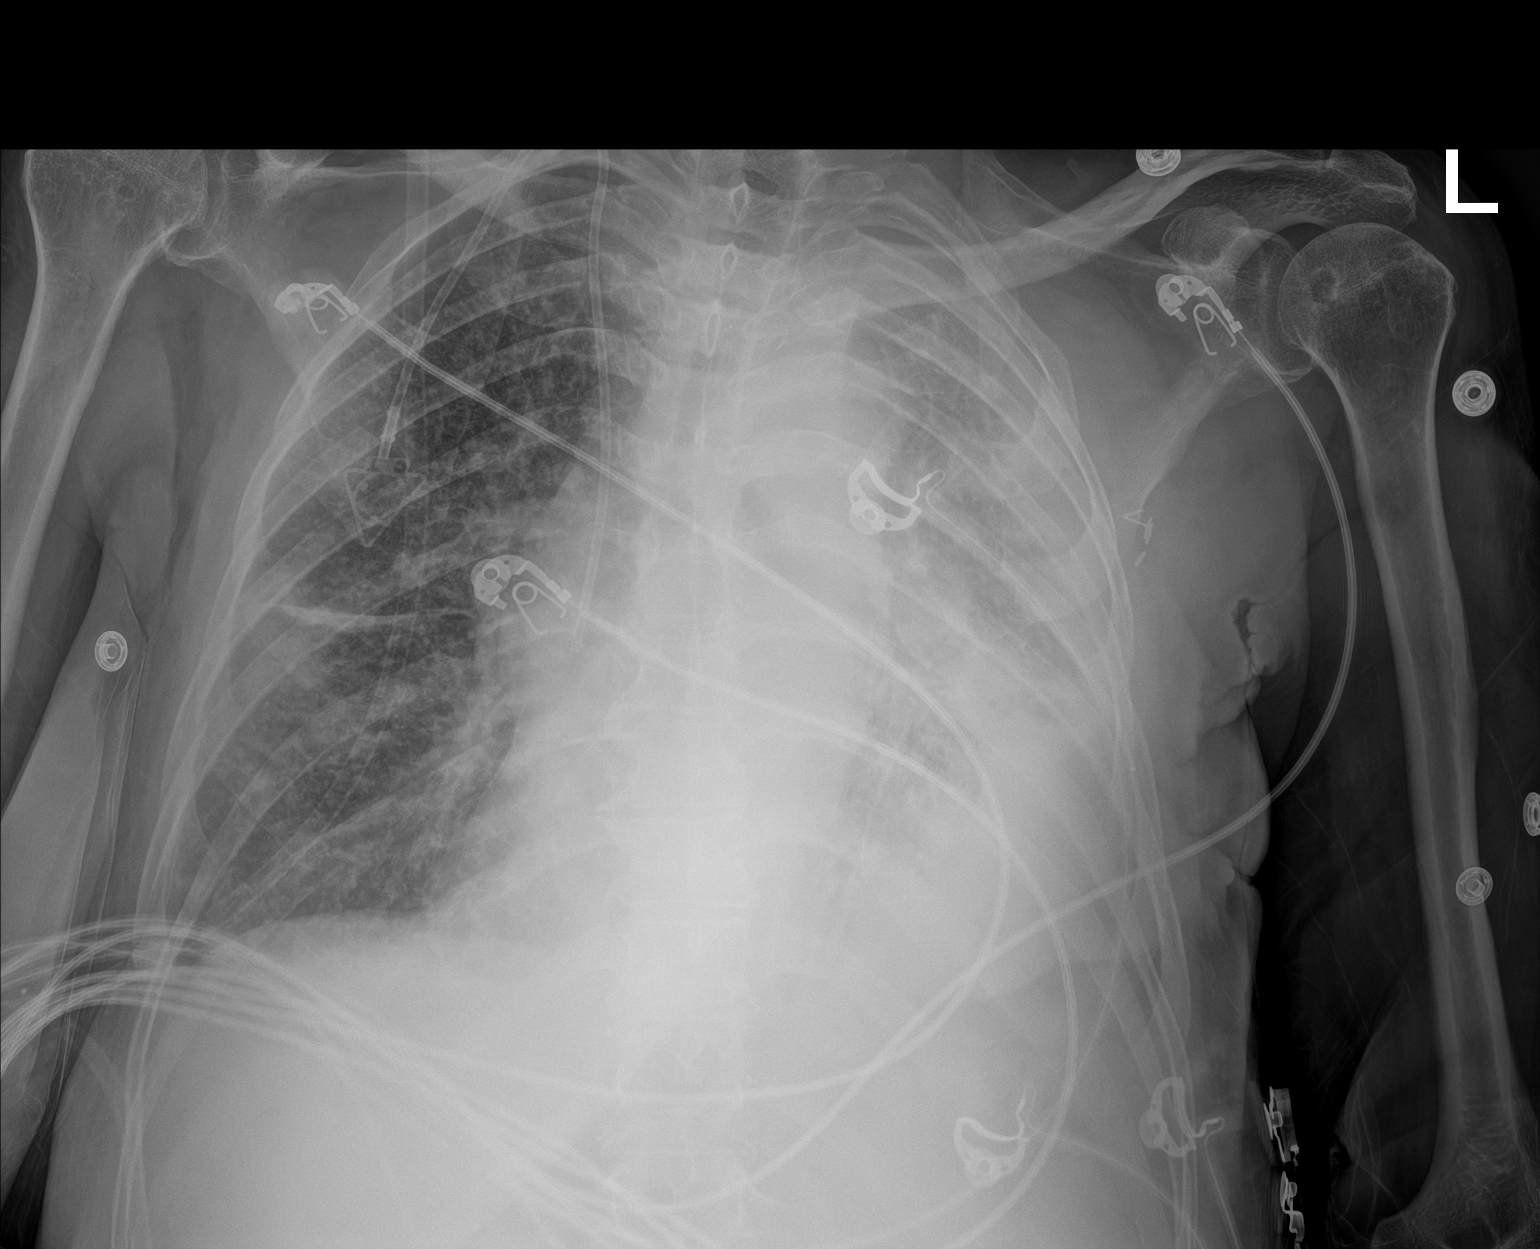

[1 of 1 positions shown; findings below may reference images not displayed]

FINDINGS: LEFT pleural effusion and LEFT lung opacities are unchanged.

Increased interstitial opacities within the RIGHT lung are noted.

Probable trace RIGHT pleural effusion noted.

A RIGHT Port-A-Cath is again identified with tip overlying the LOWER
SVC.

No pneumothorax.
IMPRESSION: Increased RIGHT lung interstitial opacities, favor edema over
infection. Probable trace RIGHT pleural effusion.

Unchanged LEFT pleural effusion and diffuse LEFT lung opacities.
